# Patient Record
Sex: Female | Born: 1955 | Race: Black or African American | Hispanic: No | Smoking: Never smoker
Health system: Southern US, Community
[De-identification: ages and names within clinical notes are randomized; demographics above are authoritative.]

## PROBLEM LIST (undated history)

## (undated) DIAGNOSIS — E785 Hyperlipidemia, unspecified: Secondary | ICD-10-CM

## (undated) DIAGNOSIS — R42 Dizziness and giddiness: Secondary | ICD-10-CM

## (undated) DIAGNOSIS — D649 Anemia, unspecified: Secondary | ICD-10-CM

## (undated) DIAGNOSIS — E119 Type 2 diabetes mellitus without complications: Secondary | ICD-10-CM

## (undated) DIAGNOSIS — Z9889 Other specified postprocedural states: Secondary | ICD-10-CM

## (undated) DIAGNOSIS — I10 Essential (primary) hypertension: Secondary | ICD-10-CM

## (undated) DIAGNOSIS — R7303 Prediabetes: Secondary | ICD-10-CM

## (undated) DIAGNOSIS — R112 Nausea with vomiting, unspecified: Secondary | ICD-10-CM

## (undated) DIAGNOSIS — F419 Anxiety disorder, unspecified: Secondary | ICD-10-CM

## (undated) DIAGNOSIS — M1990 Unspecified osteoarthritis, unspecified site: Secondary | ICD-10-CM

## (undated) DIAGNOSIS — T8859XA Other complications of anesthesia, initial encounter: Secondary | ICD-10-CM

## (undated) DIAGNOSIS — I498 Other specified cardiac arrhythmias: Secondary | ICD-10-CM

## (undated) HISTORY — DX: Hyperlipidemia, unspecified: E78.5

## (undated) HISTORY — DX: Anemia, unspecified: D64.9

## (undated) HISTORY — PX: SHOULDER ARTHROSCOPY W/ ROTATOR CUFF REPAIR: SHX2400

## (undated) HISTORY — DX: Other specified cardiac arrhythmias: I49.8

## (undated) HISTORY — DX: Unspecified osteoarthritis, unspecified site: M19.90

## (undated) HISTORY — DX: Anxiety disorder, unspecified: F41.9

## (undated) HISTORY — DX: Type 2 diabetes mellitus without complications: E11.9

## (undated) HISTORY — PX: OTHER SURGICAL HISTORY: SHX169

## (undated) HISTORY — DX: Essential (primary) hypertension: I10

---

## 2004-01-20 ENCOUNTER — Emergency Department: Payer: Self-pay | Admitting: Emergency Medicine

## 2004-04-21 ENCOUNTER — Ambulatory Visit: Payer: Self-pay | Admitting: Physician Assistant

## 2004-05-11 ENCOUNTER — Other Ambulatory Visit: Payer: Self-pay

## 2004-05-13 ENCOUNTER — Ambulatory Visit: Payer: Self-pay | Admitting: Unknown Physician Specialty

## 2005-03-08 ENCOUNTER — Emergency Department: Payer: Self-pay | Admitting: Internal Medicine

## 2006-01-11 ENCOUNTER — Ambulatory Visit: Payer: Self-pay | Admitting: Family Medicine

## 2006-09-24 ENCOUNTER — Ambulatory Visit: Payer: Self-pay | Admitting: Family Medicine

## 2007-04-20 ENCOUNTER — Ambulatory Visit: Payer: Self-pay | Admitting: Family Medicine

## 2007-12-30 ENCOUNTER — Ambulatory Visit: Payer: Self-pay

## 2008-03-26 ENCOUNTER — Ambulatory Visit: Payer: Self-pay | Admitting: Unknown Physician Specialty

## 2008-04-03 ENCOUNTER — Ambulatory Visit: Payer: Self-pay | Admitting: Unknown Physician Specialty

## 2011-02-04 ENCOUNTER — Ambulatory Visit: Payer: Self-pay | Admitting: Family Medicine

## 2012-02-08 ENCOUNTER — Ambulatory Visit: Payer: Self-pay | Admitting: Family Medicine

## 2012-03-14 ENCOUNTER — Emergency Department: Payer: Self-pay | Admitting: Emergency Medicine

## 2012-04-04 ENCOUNTER — Ambulatory Visit: Payer: Self-pay | Admitting: Family Medicine

## 2013-03-06 ENCOUNTER — Encounter: Payer: Self-pay | Admitting: Podiatry

## 2013-03-06 ENCOUNTER — Ambulatory Visit (INDEPENDENT_AMBULATORY_CARE_PROVIDER_SITE_OTHER): Payer: BC Managed Care – PPO | Admitting: Podiatry

## 2013-03-06 VITALS — BP 197/108 | HR 89 | Resp 16 | Ht 63.0 in | Wt 215.0 lb

## 2013-03-06 DIAGNOSIS — M722 Plantar fascial fibromatosis: Secondary | ICD-10-CM

## 2013-03-06 MED ORDER — TRIAMCINOLONE ACETONIDE 10 MG/ML IJ SUSP
10.0000 mg | Freq: Once | INTRAMUSCULAR | Status: AC
  Administered 2013-03-06: 10 mg

## 2013-03-06 MED ORDER — DICLOFENAC SODIUM 75 MG PO TBEC: 75.0000 mg | DELAYED_RELEASE_TABLET | Freq: Two times a day (BID) | ORAL | Status: DC

## 2013-03-06 NOTE — Progress Notes (Signed)
   Subjective:    Patient ID: Robin Daniels, female    DOB: March 21, 1956, 57 y.o.   MRN: [id number]  HPI Comments: Last injection did not help     Review of Systems     Objective:   Physical Exam        Assessment & Plan:

## 2013-03-07 NOTE — Progress Notes (Signed)
Subjective:     Patient ID: Robin Daniels, female   DOB: 1955/12/12, 57 y.o.   MRN: [id number]  HPI patient states my right foot is very sore and makes it very hard for me to walk on   Review of Systems     Objective:   Physical Exam Neurovascular status intact with intense discomfort plantar aspect right heel at the insertional point of the tendon into the calcaneus    Assessment:     Plan her fasciitis right of acute nature with inflammation and pain    Plan:     H&P done and discussed condition. Complete immobilization due to the intensity of discomfort and today air fracture walker dispensed with instructions on usage due to the inflammation I reinjected the tendon 3 mg Kenalog 5 of Xylocaine Marcaine mixture to reduce the inflammatory complex and reappoint in 3 weeks

## 2013-03-27 ENCOUNTER — Ambulatory Visit: Payer: BC Managed Care – PPO | Admitting: Podiatry

## 2013-04-03 ENCOUNTER — Ambulatory Visit: Payer: BC Managed Care – PPO | Admitting: Podiatry

## 2014-04-15 ENCOUNTER — Ambulatory Visit: Payer: Self-pay | Admitting: Family Medicine

## 2014-05-31 ENCOUNTER — Ambulatory Visit: Payer: Self-pay | Admitting: Family Medicine

## 2014-06-13 SURGERY — Surgical Case
Anesthesia: *Unknown

## 2014-06-27 ENCOUNTER — Ambulatory Visit
Admit: 2014-06-27 | Disposition: A | Discharge status: Home / Self Care | Payer: Self-pay | Attending: Family Medicine | Admitting: Family Medicine

## 2014-07-23 ENCOUNTER — Ambulatory Visit
Admit: 2014-07-23 | Disposition: A | Discharge status: Home / Self Care | Payer: Self-pay | Attending: Gastroenterology | Admitting: Gastroenterology

## 2014-07-25 LAB — SURGICAL PATHOLOGY

## 2014-09-09 ENCOUNTER — Telehealth: Payer: Self-pay | Admitting: Family Medicine

## 2014-09-09 NOTE — Telephone Encounter (Signed)
Contacted patient to inform her that she will have to come in for an office visit in order for her paperwork to be complete by Dr. Wynetta Fines. Patient was informed that if her blood pressure and/or her EKG was not with normal limits then her surgery will have to be postponed. Patient agreed and asked if I could set her up an appointment. Patient was scheduled for this Wednesday (09/11/14) at 3:30pm.

## 2014-09-09 NOTE — Telephone Encounter (Signed)
Please contact patient and ask her to f/u in office for medical clearance examination. [facility] is Requesting Medical Clearance. Please encourage patient to take her medications regularly because if her blood pressure is high or if her EKG during the office visit is abnormal she will have to delay her surgery until we do further work up.

## 2014-09-11 ENCOUNTER — Ambulatory Visit (INDEPENDENT_AMBULATORY_CARE_PROVIDER_SITE_OTHER): Payer: BLUE CROSS/BLUE SHIELD | Admitting: Family Medicine

## 2014-09-11 ENCOUNTER — Encounter: Payer: Self-pay | Admitting: Family Medicine

## 2014-09-11 VITALS — BP 140/88 | HR 76 | Temp 98.0°F | Resp 18 | Ht 64.0 in | Wt 211.1 lb

## 2014-09-11 DIAGNOSIS — E559 Vitamin D deficiency, unspecified: Secondary | ICD-10-CM | POA: Insufficient documentation

## 2014-09-11 DIAGNOSIS — Q210 Ventricular septal defect: Secondary | ICD-10-CM | POA: Insufficient documentation

## 2014-09-11 DIAGNOSIS — Z1239 Encounter for other screening for malignant neoplasm of breast: Secondary | ICD-10-CM | POA: Insufficient documentation

## 2014-09-11 DIAGNOSIS — Z23 Encounter for immunization: Secondary | ICD-10-CM

## 2014-09-11 DIAGNOSIS — Z87898 Personal history of other specified conditions: Secondary | ICD-10-CM | POA: Insufficient documentation

## 2014-09-11 DIAGNOSIS — E119 Type 2 diabetes mellitus without complications: Secondary | ICD-10-CM | POA: Diagnosis not present

## 2014-09-11 DIAGNOSIS — R7301 Impaired fasting glucose: Secondary | ICD-10-CM | POA: Insufficient documentation

## 2014-09-11 DIAGNOSIS — Z0000 Encounter for general adult medical examination without abnormal findings: Secondary | ICD-10-CM | POA: Insufficient documentation

## 2014-09-11 DIAGNOSIS — R7309 Other abnormal glucose: Secondary | ICD-10-CM

## 2014-09-11 DIAGNOSIS — E785 Hyperlipidemia, unspecified: Secondary | ICD-10-CM | POA: Insufficient documentation

## 2014-09-11 DIAGNOSIS — D509 Iron deficiency anemia, unspecified: Secondary | ICD-10-CM | POA: Insufficient documentation

## 2014-09-11 DIAGNOSIS — R7303 Prediabetes: Secondary | ICD-10-CM | POA: Insufficient documentation

## 2014-09-11 DIAGNOSIS — R058 Other specified cough: Secondary | ICD-10-CM | POA: Insufficient documentation

## 2014-09-11 DIAGNOSIS — Z1211 Encounter for screening for malignant neoplasm of colon: Secondary | ICD-10-CM | POA: Insufficient documentation

## 2014-09-11 DIAGNOSIS — F419 Anxiety disorder, unspecified: Secondary | ICD-10-CM | POA: Insufficient documentation

## 2014-09-11 DIAGNOSIS — I10 Essential (primary) hypertension: Secondary | ICD-10-CM | POA: Insufficient documentation

## 2014-09-11 DIAGNOSIS — M25561 Pain in right knee: Secondary | ICD-10-CM

## 2014-09-11 DIAGNOSIS — G8929 Other chronic pain: Secondary | ICD-10-CM | POA: Diagnosis not present

## 2014-09-11 DIAGNOSIS — Z789 Other specified health status: Secondary | ICD-10-CM | POA: Insufficient documentation

## 2014-09-11 DIAGNOSIS — E668 Other obesity: Secondary | ICD-10-CM | POA: Insufficient documentation

## 2014-09-11 DIAGNOSIS — E669 Obesity, unspecified: Secondary | ICD-10-CM | POA: Insufficient documentation

## 2014-09-11 DIAGNOSIS — R05 Cough: Secondary | ICD-10-CM | POA: Insufficient documentation

## 2014-09-11 DIAGNOSIS — G43009 Migraine without aura, not intractable, without status migrainosus: Secondary | ICD-10-CM | POA: Insufficient documentation

## 2014-09-11 DIAGNOSIS — M722 Plantar fascial fibromatosis: Secondary | ICD-10-CM | POA: Insufficient documentation

## 2014-09-11 HISTORY — DX: Anxiety disorder, unspecified: F41.9

## 2014-09-11 LAB — POCT GLYCOSYLATED HEMOGLOBIN (HGB A1C)
Hemoglobin A1C: 5.9
Hemoglobin A1C: 5.9

## 2014-09-11 NOTE — Progress Notes (Signed)
Name: Robin Daniels   MRN: [id number]    DOB: 27-Oct-1955   Date:09/11/2014       Progress Note  Subjective  Chief Complaint  Chief Complaint  Patient presents with  . Medical Clearance    patient will soon be having a right total knee replacement at [facility], performed by Dr. Danella Sensing    HPI  Joint/Muscle Pain: Patient complains of arthralgias for which has been present for several years. Pain is located in the right knee(s), is described as aching, and is severe .  Associated symptoms include: decreased range of motion, effusion and instability.  The patient has tried cold, heat, NSAIDs and rest for pain, with minimal relief.  Related to injury:   no.  Patient Active Problem List   Diagnosis Date Noted  . Encounter for general adult medical examination without abnormal findings 09/11/2014  . Anxiety 09/11/2014  . Arthritis 09/11/2014  . Gravida 2 para 2 09/11/2014  . BP (high blood pressure) 09/11/2014  . HLD (hyperlipidemia) 09/11/2014  . Migraine without aura and responsive to treatment 09/11/2014  . Nocturnal cough 09/11/2014  . Extreme obesity 09/11/2014  . Anemia, iron deficiency 09/11/2014  . Parity 2 09/11/2014  . Plantar fasciitis 09/11/2014  . Borderline diabetes 09/11/2014  . Screening breast examination 09/11/2014  . Encounter for screening for malignant neoplasm of colon 09/11/2014  . Avitaminosis D 09/11/2014  . Absence of interventricular septum 09/11/2014    History  Substance Use Topics  . Smoking status: Never Smoker   . Smokeless tobacco: Never Used  . Alcohol Use: No     Current outpatient prescriptions:  .  acetaminophen-codeine (TYLENOL #4) 300-60 MG per tablet, Take by mouth., Disp: , Rfl:  .  diclofenac (VOLTAREN) 75 MG EC tablet, Take 1 tablet (75 mg total) by mouth 2 (two) times daily., Disp: 60 tablet, Rfl: 0 .  ibuprofen (ADVIL,MOTRIN) 800 MG tablet, Take by mouth., Disp: , Rfl:  .  lisinopril-hydrochlorothiazide (PRINZIDE,ZESTORETIC) 20-12.5  MG per tablet, Take by mouth., Disp: , Rfl:  .  metoprolol succinate (TOPROL-XL) 100 MG 24 hr tablet, Take by mouth., Disp: , Rfl:  .  simvastatin (ZOCOR) 10 MG tablet, Take by mouth., Disp: , Rfl:   Past Surgical History  Procedure Laterality Date  . Shoulder arthroscopy w/ rotator cuff repair Right     Family History  Problem Relation Age of Onset  . Diabetes Mother   . Hyperlipidemia Mother   . Hypertension Mother   . Vision loss Mother   . Arthritis Father   . Hyperlipidemia Father   . Hypertension Father     No Known Allergies   Review of Systems  Ten systems reviewed and is negative except as mentioned in HPI.    Objective  BP 140/88 mmHg  Pulse 76  Temp(Src) 98 F (36.7 C) (Oral)  Resp 18  Ht 5\' 4"  (1.626 m)  Wt 211 lb 1.6 oz (95.754 kg)  BMI 36.22 kg/m2  SpO2 98%  Physical Exam  Constitutional: Patient appears obese and well-nourished. In no distress.  HEENT:  - Head: Normocephalic and atraumatic.  - Ears: Bilateral TMs gray, no erythema or effusion - Nose: Nasal mucosa moist - Mouth/Throat: Oropharynx is clear and moist. No tonsillar hypertrophy or erythema. No post nasal drainage.  - Eyes: Conjunctivae clear, EOM movements normal. PERRLA. No scleral icterus.  Neck: Normal range of motion. Neck supple. No JVD present. No thyromegaly present.  Cardiovascular: Normal rate, regular rhythm and normal heart sounds.  No murmur heard.  Pulmonary/Chest: Effort normal and breath sounds normal. No respiratory distress. Musculoskeletal: Normal range of motion bilateral UE. Right LE with modest swelling, crepitus on exam. Peripheral vascular: Bilateral LE no edema. Neurological: CN II-XII grossly intact with no focal deficits. Alert and oriented to person, place, and time. Coordination, balance, strength, speech and gait are normal.  Skin: Skin is warm and dry. No rash noted. No erythema.  Psychiatric: Patient has a normal mood and affect. Behavior is normal in  office today. Judgment and thought content normal in office today.    Assessment & Plan  1. Prediabetes Hba1c <6%. EKG reviewed no concerning acute findings. Vitals stable. Patient cleared to proceed with proposed surgery.  - POCT glycosylated hemoglobin (Hb A1C) - PR ELECTROCARDIOGRAM, COMPLETE; Standing - PR ELECTROCARDIOGRAM, COMPLETE - POCT glycosylated hemoglobin (Hb A1C); Standing - POCT glycosylated hemoglobin (Hb A1C)  2. Need for diphtheria-tetanus-pertussis (Tdap) vaccine, adult/adolescent  - Tdap vaccine greater than or equal to 7yo IM  3. Chronic pain of right knee Patient cleared to proceed with proposed surgery.

## 2014-09-15 ENCOUNTER — Emergency Department
Admission: EM | Admit: 2014-09-15 | Discharge: 2014-09-15 | Disposition: A | Discharge status: Home / Self Care | Payer: Self-pay

## 2014-09-18 ENCOUNTER — Encounter: Payer: Self-pay | Admitting: Family Medicine

## 2014-10-17 DIAGNOSIS — I10 Essential (primary) hypertension: Secondary | ICD-10-CM | POA: Insufficient documentation

## 2014-10-17 DIAGNOSIS — E7800 Pure hypercholesterolemia, unspecified: Secondary | ICD-10-CM | POA: Insufficient documentation

## 2014-10-17 DIAGNOSIS — Z96659 Presence of unspecified artificial knee joint: Secondary | ICD-10-CM | POA: Insufficient documentation

## 2015-02-18 ENCOUNTER — Encounter: Payer: Self-pay | Admitting: Family Medicine

## 2015-02-18 ENCOUNTER — Ambulatory Visit (INDEPENDENT_AMBULATORY_CARE_PROVIDER_SITE_OTHER): Payer: BLUE CROSS/BLUE SHIELD | Admitting: Family Medicine

## 2015-02-18 VITALS — BP 174/100 | HR 84 | Temp 98.2°F | Resp 16 | Wt 201.5 lb

## 2015-02-18 DIAGNOSIS — I10 Essential (primary) hypertension: Secondary | ICD-10-CM

## 2015-02-18 DIAGNOSIS — I160 Hypertensive urgency: Secondary | ICD-10-CM

## 2015-02-18 DIAGNOSIS — R002 Palpitations: Secondary | ICD-10-CM | POA: Insufficient documentation

## 2015-02-18 MED ORDER — METOPROLOL SUCCINATE ER 50 MG PO TB24: 50.0000 mg | ORAL_TABLET | Freq: Every day | ORAL | Status: DC

## 2015-02-18 NOTE — Progress Notes (Signed)
Name: KHADRA RUTT   MRN: [id number]    DOB: [birth year]   Date:02/18/2015       Progress Note  Subjective  Chief Complaint  Chief Complaint  Patient presents with  . Hypertension  . Irregular Heart Beat    patient stated that she has been having lots of heart flutters which results in a cough.    HPI  Guadelupe Brevig is a 59 year old female who last saw me 09/11/14 and since then has had right total knee replacement at [facility]. However after her surgery she stopped all of her chronic medications including Lisinopril-HCTZ 20-12.5mg  one a day, Toprol XL 100 mg one a day, Simvastatin 10 mg one a day. She now reports having heart flutters and a dry cough. Denies chest pain, syncope, headaches, paresthesia.  Past Medical History  Diagnosis Date  . Anemia     history of  . Arthritis   . Diabetes mellitus without complication ([place])   . Fluttering heart ([place])   . Hyperlipidemia   . Hypertension   . Anxiety 09/11/2014    Patient Active Problem List   Diagnosis Date Noted  . Essential (primary) hypertension 10/17/2014  . Pure hypercholesterolemia 10/17/2014  . H/O total knee replacement 10/17/2014  . Encounter for general adult medical examination without abnormal findings 09/11/2014  . Anxiety 09/11/2014  . Chronic pain of right knee 09/11/2014  . Gravida 2 para 2 09/11/2014  . BP (high blood pressure) 09/11/2014  . HLD (hyperlipidemia) 09/11/2014  . Migraine without aura and responsive to treatment 09/11/2014  . Nocturnal cough 09/11/2014  . Extreme obesity ([place]) 09/11/2014  . Anemia, iron deficiency 09/11/2014  . Parity 2 09/11/2014  . Plantar fasciitis 09/11/2014  . Borderline diabetes 09/11/2014  . Screening breast examination 09/11/2014  . Encounter for screening for malignant neoplasm of colon 09/11/2014  . Avitaminosis D 09/11/2014  . Absence of interventricular septum 09/11/2014  . 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 Morbid obesity ([place]) 09/11/2014    Social History   Substance Use Topics  . Smoking status: Never Smoker   . Smokeless tobacco: Never Used  . Alcohol Use: No     Current outpatient prescriptions:  .  acetaminophen-codeine (TYLENOL #4) 300-60 MG per tablet, Take by mouth., Disp: , Rfl:  .  diclofenac (VOLTAREN) 75 MG EC tablet, Take 1 tablet (75 mg total) by mouth 2 (two) times daily. (Patient not taking: Reported on 02/18/2015), Disp: 60 tablet, Rfl: 0 .  ibuprofen (ADVIL,MOTRIN) 800 MG tablet, Take by mouth., Disp: , Rfl:  .  lisinopril-hydrochlorothiazide (PRINZIDE,ZESTORETIC) 20-12.5 MG per tablet, Take by mouth., Disp: , Rfl:  .  metoprolol succinate (TOPROL-XL) 100 MG 24 hr tablet, Take by mouth., Disp: , Rfl:  .  simvastatin (ZOCOR) 10 MG tablet, Take by mouth., Disp: , Rfl:   Past Surgical History  Procedure Laterality Date  . Shoulder arthroscopy w/ rotator cuff repair Right     Family History  Problem Relation Age of Onset  . Diabetes Mother   . Hyperlipidemia Mother   . Hypertension Mother   . Vision loss Mother   . Arthritis Father   . Hyperlipidemia Father   . Hypertension Father     No Known Allergies   Review of Systems  CONSTITUTIONAL: No significant weight changes, fever, chills, weakness or fatigue.  CARDIOVASCULAR: No chest pain, chest pressure or chest discomfort. Yes palpitations. RESPIRATORY: No shortness of breath. Yes dry cough. NEUROLOGICAL: No headache, dizziness, syncope, paralysis, ataxia, numbness or tingling  in the extremities. No memory changes. No change in bowel or bladder control.  MUSCULOSKELETAL: No joint pain. No muscle pain. PSYCHIATRIC: No change in mood. No change in sleep pattern.  ENDOCRINOLOGIC: No reports of sweating, cold or heat intolerance. No polyuria or polydipsia.     Objective  BP 174/100 mmHg  Pulse 84  Temp(Src) 98.2 F (36.8 C) (Oral)  Resp 16  Wt 201 lb 8 oz (91.4 kg)  SpO2 96% Body mass index is 34.57 kg/(m^2).  Physical Exam  Constitutional: Patient  appears to have lost some weight and is well-nourished. In no distress.   Neck: Normal range of motion. Neck supple. No JVD present. No thyromegaly present.  Cardiovascular: Normal rate, regular rhythm and normal heart sounds.  No murmur heard.  Pulmonary/Chest: Effort normal and breath sounds normal. No respiratory distress. Musculoskeletal: Normal range of motion bilateral UE and LE, no joint effusions. Peripheral vascular: Bilateral LE no edema. Neurological: CN II-XII grossly intact with no focal deficits. Alert and oriented to person, place, and time. Coordination, balance, strength, speech and gait are normal.  Skin: Skin is warm and dry. No rash noted. No erythema.  Psychiatric: Patient has a normal mood and affect. Behavior is normal in office today. Judgment and thought content normal in office today.  Assessment & Plan  1. Hypertension goal BP (blood pressure) < 140/90 Non compliant with medication regimen since her knee surgery. Counseled patient on the importance of BP control. Will start her back on Toprol XL 50 mg one a day (previously she was on 100 mg dose).  - EKG 12-Lead - metoprolol succinate (TOPROL-XL) 50 MG 24 hr tablet; Take 1 tablet (50 mg total) by mouth daily.  Dispense: 30 tablet; Refill: 1  2. Hypertensive urgency EKG shows NSR with no ST segment changes or T wave inversions, does however suggest LVH. Recommended that she consult with Cardiologist near future for Echo and Stress testing. She preferred to get her BP down first.  - EKG 12-Lead  3. Heart palpitations Due to uncontrolled HTN.

## 2015-03-11 ENCOUNTER — Ambulatory Visit: Payer: BLUE CROSS/BLUE SHIELD | Admitting: Family Medicine

## 2015-03-19 ENCOUNTER — Ambulatory Visit: Payer: BLUE CROSS/BLUE SHIELD | Admitting: Family Medicine

## 2015-10-06 ENCOUNTER — Ambulatory Visit (INDEPENDENT_AMBULATORY_CARE_PROVIDER_SITE_OTHER): Payer: BLUE CROSS/BLUE SHIELD | Admitting: Family Medicine

## 2015-10-06 ENCOUNTER — Encounter: Payer: Self-pay | Admitting: Family Medicine

## 2015-10-06 VITALS — BP 162/90 | HR 81 | Temp 98.4°F | Resp 14 | Wt 209.5 lb

## 2015-10-06 DIAGNOSIS — I10 Essential (primary) hypertension: Secondary | ICD-10-CM

## 2015-10-06 DIAGNOSIS — M545 Low back pain, unspecified: Secondary | ICD-10-CM

## 2015-10-06 DIAGNOSIS — R7301 Impaired fasting glucose: Secondary | ICD-10-CM

## 2015-10-06 DIAGNOSIS — E669 Obesity, unspecified: Secondary | ICD-10-CM

## 2015-10-06 MED ORDER — TRAMADOL HCL 50 MG PO TABS: 50.0000 mg | ORAL_TABLET | Freq: Four times a day (QID) | ORAL | Status: DC | PRN

## 2015-10-06 MED ORDER — METOPROLOL SUCCINATE ER 50 MG PO TB24: ORAL_TABLET | ORAL | Status: DC

## 2015-10-06 NOTE — Progress Notes (Signed)
BP (!) 162/90   Pulse 81   Temp 98.4 F (36.9 C) (Oral)   Resp 14   Wt 209 lb 8 oz (95 kg)   SpO2 95%   BMI 35.96 kg/m    Subjective:    Patient ID: Robin Daniels, female    DOB: [birth year], 60 y.o.   MRN: [id number]  HPI: Robin Daniels is a 60 y.o. female  Chief Complaint  Patient presents with  . Back Pain    center low back 2 months, unknown trauama   Pain in the lower back x 2 months; aching pain Can find a position when she doesn't feel it at all Has had some pain down in the buttock or leg Needs a knee replacement on the left; already had the right knee done She has arthritis in the knees, not sure about the back Hurts worse when she stands or sits for a long time Rolling over in the bed might make her feel something Sometimes on the left flank Feeling it with coughing No pneumonia No fevers No blood in the urine or stools Father had back problems; had to remove a disc; no known kidney trouble in the family She had the colonoscopy done, two years ago; no problems with female organs No trauma Taking 800 mg motrins, which lasts for a little while; she has alternated with aleve, but not in the same day She has had high BP for years; she has not been taking any metoprolol for a long time; she retired months ago from her job Prediabetes; last A1c was 5.9  Depression screen [facility] 2/9 10/06/2015 02/18/2015 09/11/2014  Decreased Interest 0 0 0  Down, Depressed, Hopeless 0 0 0  PHQ - 2 Score 0 0 0   Relevant past medical, surgical, family and social history reviewed Past Medical History:  Diagnosis Date  . Anemia    history of  . Anxiety 09/11/2014  . Arthritis   . Diabetes mellitus without complication ([place])   . 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 Hyperlipidemia   . Hypertension    Past Surgical History:  Procedure Laterality Date  . right knee replacement Right [id number]  . SHOULDER ARTHROSCOPY W/ ROTATOR CUFF REPAIR Right    Family History  Problem Relation Age of  Onset  . Diabetes Mother   . Hyperlipidemia Mother   . Hypertension Mother   . Vision loss Mother   . Arthritis Father   . Hyperlipidemia Father   . Hypertension Father   . Breast cancer Maternal Aunt    Social History  Substance Use Topics  . Smoking status: Never Smoker  . Smokeless tobacco: Never Used  . Alcohol use No   Interim medical history since last visit reviewed. Allergies and medications reviewed  Review of Systems Per HPI unless specifically indicated above     Objective:    BP (!) 162/90   Pulse 81   Temp 98.4 F (36.9 C) (Oral)   Resp 14   Wt 209 lb 8 oz (95 kg)   SpO2 95%   BMI 35.96 kg/m   Wt Readings from Last 3 Encounters:  11/26/15 210 lb 3.3 oz (95.4 kg)  10/06/15 209 lb 8 oz (95 kg)  02/18/15 201 lb 8 oz (91.4 kg)    Physical Exam  Constitutional: She appears well-developed and well-nourished. No distress.  HENT:  Head: Normocephalic and atraumatic.  Eyes: EOM are normal. No scleral icterus.  Neck: No thyromegaly present.  Cardiovascular: Normal rate,  regular rhythm and normal heart sounds.   No murmur heard. Pulmonary/Chest: Effort normal and breath sounds normal. No respiratory distress. She has no wheezes.  Abdominal: Soft. Bowel sounds are normal. She exhibits no distension.  Musculoskeletal: She exhibits no edema.       Lumbar back: She exhibits decreased range of motion and tenderness. She exhibits no bony tenderness, no swelling, no edema and no deformity.  Neurological: She is alert. She displays normal reflexes. She exhibits normal muscle tone. Coordination normal.  LE strength 5/5  Skin: Skin is warm and dry. She is not diaphoretic. No pallor.  Psychiatric: She has a normal mood and affect. Her behavior is normal. Judgment and thought content normal.       Assessment & Plan:   Problem List Items Addressed This Visit      Cardiovascular and Mediastinum   Hypertension goal BP (blood pressure) < 140/90    Try the DASH  guidelines; uncontrolled, so will start back on BP medicine; try to lose 10 pounds over the next 3 months; f/u here or at [facility] soon      Relevant Medications   metoprolol succinate (TOPROL-XL) 50 MG 24 hr tablet     Endocrine   IFG (impaired fasting glucose)    Would love to get A1c but respect that she does not have insurance and she cannot afford it; she will try to get that done at [facility]; avoid white bread, white rice, white potatoes; work on weight loss        Other   Obesity    Encouraged modest weight loss, little by little but cutting back on calories, consistent effort, dirnking adequate water, important to help with her pressure and glucose and lipids       Other Visit Diagnoses    Bilateral low back pain without sciatica    -  Primary   will check urine to r/o hematuria; exercises recommended; pt has no insurance, so will skip xrays at this time; urged to call if not improving with treatment   Relevant Medications   traMADol (ULTRAM) 50 MG tablet   Other Relevant Orders   Urinalysis w microscopic + reflex cultur (Completed)      Follow up plan: No Follow-up on file.  An after-visit summary was printed and given to the patient at [place].  Please see the patient instructions which may contain other information and recommendations beyond what is mentioned above in the assessment and plan.  Meds ordered this encounter  Medications  . traMADol (ULTRAM) 50 MG tablet    Sig: Take 1 tablet (50 mg total) by mouth every 6 (six) hours as needed.    Dispense:  25 tablet    Refill:  0  . metoprolol succinate (TOPROL-XL) 50 MG 24 hr tablet    Sig: Start with just half of a pill by mouth for six days, then take a whole pill every day (for blood pressure)    Dispense:  30 tablet    Refill:  0    Orders Placed This Encounter  Procedures  . Urinalysis w microscopic + reflex cultur

## 2015-10-06 NOTE — Assessment & Plan Note (Addendum)
Would love to get A1c but respect that she does not have insurance and she cannot afford it; she will try to get that done at [facility]; avoid white bread, white rice, white potatoes; work on weight loss

## 2015-10-06 NOTE — Assessment & Plan Note (Addendum)
Try the DASH guidelines; uncontrolled, so will start back on BP medicine; try to lose 10 pounds over the next 3 months; f/u here or at [facility] soon

## 2015-10-06 NOTE — Patient Instructions (Addendum)
Please do check out [facility] to get healthcare if affordability is a problem Please do the urine test today  Your goal blood pressure is less than 150 mmHg on top, unless you develop diabetes and then the top goal will be less than 140 on top Try to follow the DASH guidelines (DASH stands for Dietary Approaches to Stop Hypertension) Try to limit the sodium in your diet.  Ideally, consume less than 1.5 grams (less than 1,500mg ) per day. Do not add salt when cooking or at the table.  Check the sodium amount on labels when shopping, and choose items lower in sodium when given a choice. Avoid or limit foods that already contain a lot of sodium. Eat a diet rich in fruits and vegetables and whole grains.  Check your blood pressure a few times over the coming two weeks and call me back with your readings  Check out the information at familydoctor.org entitled "Nutrition for Weight Loss: What You Need to Know about Fad Diets" Try to lose between 1-2 pounds per week by taking in fewer calories and burning off more calories You can succeed by limiting portions, limiting foods dense in calories and fat, becoming more active, and drinking 8 glasses of water a day (64 ounces) Don't skip meals, especially breakfast, as skipping meals may alter your metabolism Do not use over-the-counter weight loss pills or gimmicks that claim rapid weight loss A healthy BMI (or body mass index) is between 18.5 and 24.9 You can calculate your ideal BMI at the [place] website ClubMonetize.fr  Back Exercises The following exercises strengthen the muscles that help to support the back. They also help to keep the lower back flexible. Doing these exercises can help to prevent back pain or lessen existing pain. If you have back pain or discomfort, try doing these exercises 2-3 times each day or as told by your health care provider. When the pain goes away, do them once each  day, but increase the number of times that you repeat the steps for each exercise (do more repetitions). If you do not have back pain or discomfort, do these exercises once each day or as told by your health care provider. EXERCISES Single Knee to Chest Repeat these steps 3-5 times for each leg:  Lie on your back on a firm bed or the floor with your legs extended.  Bring one knee to your chest. Your other leg should stay extended and in contact with the floor.  Hold your knee in place by grabbing your knee or thigh.  Pull on your knee until you feel a gentle stretch in your lower back.  Hold the stretch for 10-30 seconds.  Slowly release and straighten your leg. Pelvic Tilt Repeat these steps 5-10 times: 1. Lie on your back on a firm bed or the floor with your legs extended. 2. Bend your knees so they are pointing toward the ceiling and your feet are flat on the floor. 3. Tighten your lower abdominal muscles to press your lower back against the floor. This motion will tilt your pelvis so your tailbone points up toward the ceiling instead of pointing to your feet or the floor. 4. With gentle tension and even breathing, hold this position for 5-10 seconds. Cat-Cow Repeat these steps until your lower back becomes more flexible: 1. Get into a hands-and-knees position on a firm surface. Keep your hands under your shoulders, and keep your knees under your hips. You may place padding under your knees  for comfort. 2. Let your head hang down, and point your tailbone toward the floor so your lower back becomes rounded like the back of a cat. 3. Hold this position for 5 seconds. 4. Slowly lift your head and point your tailbone up toward the ceiling so your back forms a sagging arch like the back of a cow. 5. Hold this position for 5 seconds. Press-Ups Repeat these steps 5-10 times: 1. Lie on your abdomen (face-down) on the floor. 2. Place your palms near your head, about shoulder-width  apart. 3. While you keep your back as relaxed as possible and keep your hips on the floor, slowly straighten your arms to raise the top half of your body and lift your shoulders. Do not use your back muscles to raise your upper torso. You may adjust the placement of your hands to make yourself more comfortable. 4. Hold this position for 5 seconds while you keep your back relaxed. 5. Slowly return to lying flat on the floor. Bridges Repeat these steps 10 times: 1. Lie on your back on a firm surface. 2. Bend your knees so they are pointing toward the ceiling and your feet are flat on the floor. 3. Tighten your buttocks muscles and lift your buttocks off of the floor until your waist is at almost the same height as your knees. You should feel the muscles working in your buttocks and the back of your thighs. If you do not feel these muscles, slide your feet 1-2 inches farther away from your buttocks. 4. Hold this position for 3-5 seconds. 5. Slowly lower your hips to the starting position, and allow your buttocks muscles to relax completely. If this exercise is too easy, try doing it with your arms crossed over your chest. Abdominal Crunches Repeat these steps 5-10 times: 1. Lie on your back on a firm bed or the floor with your legs extended. 2. Bend your knees so they are pointing toward the ceiling and your feet are flat on the floor. 3. Cross your arms over your chest. 4. Tip your chin slightly toward your chest without bending your neck. 5. Tighten your abdominal muscles and slowly raise your trunk (torso) high enough to lift your shoulder blades a tiny bit off of the floor. Avoid raising your torso higher than that, because it can put too much stress on your low back and it does not help to strengthen your abdominal muscles. 6. Slowly return to your starting position. Back Lifts Repeat these steps 5-10 times: 1. Lie on your abdomen (face-down) with your arms at your sides, and rest your  forehead on the floor. 2. Tighten the muscles in your legs and your buttocks. 3. Slowly lift your chest off of the floor while you keep your hips pressed to the floor. Keep the back of your head in line with the curve in your back. Your eyes should be looking at the floor. 4. Hold this position for 3-5 seconds. 5. Slowly return to your starting position. SEEK MEDICAL CARE IF:  Your back pain or discomfort gets much worse when you do an exercise.  Your back pain or discomfort does not lessen within 2 hours after you exercise. If you have any of these problems, stop doing these exercises right away. Do not do them again unless your health care provider says that you can. SEEK IMMEDIATE MEDICAL CARE IF:  You develop sudden, severe back pain. If this happens, stop doing the exercises right away. Do not do them  again unless your health care provider says that you can.   This information is not intended to replace advice given to you by your health care provider. Make sure you discuss any questions you have with your health care provider.   Document Released: 04/22/2004 Document Revised: 12/04/2014 Document Reviewed: 05/09/2014 Elsevier Interactive Patient Education 2016 [place] DASH stands for "Dietary Approaches to Stop Hypertension." The DASH eating plan is a healthy eating plan that has been shown to reduce high blood pressure (hypertension). Additional health benefits may include reducing the risk of type 2 diabetes mellitus, heart disease, and stroke. The DASH eating plan may also help with weight loss. WHAT DO I NEED TO KNOW ABOUT THE DASH EATING PLAN? For the DASH eating plan, you will follow these general guidelines:  Choose foods with a percent daily value for sodium of less than 5% (as listed on the food label).  Use salt-free seasonings or herbs instead of table salt or sea salt.  Check with your health care provider or pharmacist before using salt  substitutes.  Eat lower-sodium products, often labeled as "lower sodium" or "no salt added."  Eat fresh foods.  Eat more vegetables, fruits, and low-fat dairy products.  Choose whole grains. Look for the word "whole" as the first word in the ingredient list.  Choose fish and skinless chicken or [place] more often than red meat. Limit fish, poultry, and meat to 6 oz (170 g) each day.  Limit sweets, desserts, sugars, and sugary drinks.  Choose heart-healthy fats.  Limit cheese to 1 oz (28 g) per day.  Eat more home-cooked food and less restaurant, buffet, and fast food.  Limit fried foods.  Cook foods using methods other than frying.  Limit canned vegetables. If you do use them, rinse them well to decrease the sodium.  When eating at a restaurant, ask that your food be prepared with less salt, or no salt if possible. WHAT FOODS CAN I EAT? Seek help from a dietitian for individual calorie needs. Grains Whole grain or whole wheat bread. Brown rice. Whole grain or whole wheat pasta. Quinoa, bulgur, and whole grain cereals. Low-sodium cereals. Corn or whole wheat flour tortillas. Whole grain cornbread. Whole grain crackers. Low-sodium crackers. Vegetables Fresh or frozen vegetables (raw, steamed, roasted, or grilled). Low-sodium or reduced-sodium tomato and vegetable juices. Low-sodium or reduced-sodium tomato sauce and paste. Low-sodium or reduced-sodium canned vegetables.  Fruits All fresh, canned (in natural juice), or frozen fruits. Meat and Other Protein Products Ground beef (85% or leaner), grass-fed beef, or beef trimmed of fat. Skinless chicken or [place]. Ground chicken or [place]. Pork trimmed of fat. All fish and seafood. Eggs. Dried beans, peas, or lentils. Unsalted nuts and seeds. Unsalted canned beans. Dairy Low-fat dairy products, such as skim or 1% milk, 2% or reduced-fat cheeses, low-fat ricotta or cottage cheese, or plain low-fat yogurt. Low-sodium or reduced-sodium  cheeses. Fats and Oils Tub margarines without trans fats. Light or reduced-fat mayonnaise and salad dressings (reduced sodium). Avocado. Safflower, olive, or canola oils. Natural peanut or almond butter. Other Unsalted popcorn and pretzels. The items listed above may not be a complete list of recommended foods or beverages. Contact your dietitian for more options. WHAT FOODS ARE NOT RECOMMENDED? Grains White bread. White pasta. White rice. Refined cornbread. Bagels and croissants. Crackers that contain trans fat. Vegetables Creamed or fried vegetables. Vegetables in a cheese sauce. Regular canned vegetables. Regular canned tomato sauce and paste. Regular tomato and vegetable juices.  Fruits Dried fruits. Canned fruit in light or heavy syrup. Fruit juice. Meat and Other Protein Products Fatty cuts of meat. Ribs, chicken wings, bacon, sausage, bologna, salami, chitterlings, fatback, hot dogs, bratwurst, and packaged luncheon meats. Salted nuts and seeds. Canned beans with salt. Dairy Whole or 2% milk, cream, half-and-half, and cream cheese. Whole-fat or sweetened yogurt. Full-fat cheeses or blue cheese. Nondairy creamers and whipped toppings. Processed cheese, cheese spreads, or cheese curds. Condiments Onion and garlic salt, seasoned salt, table salt, and sea salt. Canned and packaged gravies. Worcestershire sauce. Tartar sauce. Barbecue sauce. Teriyaki sauce. Soy sauce, including reduced sodium. Steak sauce. Fish sauce. Oyster sauce. Cocktail sauce. Horseradish. Ketchup and mustard. Meat flavorings and tenderizers. Bouillon cubes. Hot sauce. Tabasco sauce. Marinades. Taco seasonings. Relishes. Fats and Oils Butter, stick margarine, lard, shortening, ghee, and bacon fat. Coconut, palm kernel, or palm oils. Regular salad dressings. Other Pickles and olives. Salted popcorn and pretzels. The items listed above may not be a complete list of foods and beverages to avoid. Contact your dietitian for  more information. WHERE CAN I FIND MORE INFORMATION? National Heart, Lung, and Blood Institute: travelstabloid.com   This information is not intended to replace advice given to you by your health care provider. Make sure you discuss any questions you have with your health care provider.   Document Released: 03/04/2011 Document Revised: 04/05/2014 Document Reviewed: 01/17/2013 Elsevier Interactive Patient Education [employer].

## 2015-10-07 LAB — URINALYSIS W MICROSCOPIC + REFLEX CULTURE
Bilirubin Urine: NEGATIVE
Casts: NONE SEEN [LPF]
Crystals: NONE SEEN [HPF]
Glucose, UA: NEGATIVE
Hgb urine dipstick: NEGATIVE
Ketones, ur: NEGATIVE
Leukocytes, UA: NEGATIVE
Nitrite: NEGATIVE
Protein, ur: NEGATIVE
Specific Gravity, Urine: 1.016 (ref 1.001–1.035)
Yeast: NONE SEEN [HPF]
pH: 7 (ref 5.0–8.0)

## 2015-11-04 ENCOUNTER — Other Ambulatory Visit: Payer: Self-pay | Admitting: Family Medicine

## 2015-11-26 ENCOUNTER — Ambulatory Visit
Admission: RE | Admit: 2015-11-26 | Discharge: 2015-11-26 | Disposition: A | Discharge status: Home / Self Care | Payer: Self-pay | Source: Ambulatory Visit | Attending: Oncology | Admitting: Oncology

## 2015-11-26 ENCOUNTER — Ambulatory Visit: Payer: Self-pay | Attending: Oncology | Admitting: *Deleted

## 2015-11-26 ENCOUNTER — Encounter: Payer: Self-pay | Admitting: *Deleted

## 2015-11-26 ENCOUNTER — Encounter (INDEPENDENT_AMBULATORY_CARE_PROVIDER_SITE_OTHER): Payer: Self-pay

## 2015-11-26 VITALS — BP 146/89 | HR 75 | Temp 98.1°F | Ht 65.75 in | Wt 210.2 lb

## 2015-11-26 DIAGNOSIS — Z0000 Encounter for general adult medical examination without abnormal findings: Secondary | ICD-10-CM

## 2015-11-26 NOTE — Progress Notes (Signed)
Subjective:     Patient ID: Robin Daniels, female   DOB: 1956/02/22, 60 y.o.   MRN: [id number]  HPI   Review of Systems     Objective:   Physical Exam  Pulmonary/Chest: Right breast exhibits no inverted nipple, no mass, no nipple discharge, no skin change and no tenderness. Left breast exhibits no inverted nipple, no mass, no nipple discharge, no skin change and no tenderness. Breasts are symmetrical.  Large pendulous breast  Abdominal: There is no splenomegaly or hepatomegaly.  Genitourinary: No labial fusion. There is no rash, tenderness, lesion or injury on the right labia. There is no rash, tenderness, lesion or injury on the left labia. Cervix exhibits no motion tenderness, no discharge and no friability. Right adnexum displays no mass, no tenderness and no fullness. Left adnexum displays no mass, no tenderness and no fullness. No erythema, tenderness or bleeding in the vagina. No foreign body in the vagina. No signs of injury around the vagina. No vaginal discharge found.       Assessment:     60 year old Black female presents to [facility] for clinical breast exam, pap and mammogram.  Clinical breast exam unremarkable.  Taught self breast awareness.  Specimen collected for pap smear.  Patient has been screened for eligibility.  She does not have any insurance, Medicare or Medicaid.  She also meets financial eligibility.  Hand-out given on the Affordable Care Act.    Plan:     Screening mammogram ordered.  Specimen for pap sent to the lab.

## 2015-11-26 NOTE — Patient Instructions (Signed)
Human Papillomavirus Human papillomavirus (HPV) is the most common sexually transmitted infection (STI) and is highly contagious. HPV infections cause genital warts and cancers to the outlet of the womb (cervix), birth canal (vagina), opening of the birth canal (vulva), and anus. There are over 100 types of HPV. Unless wartlike lesions are present in the throat or there are genital warts that you can see or feel, HPV usually does not cause symptoms. It is possible to be infected for long periods and pass it on to others without knowing it. CAUSES  HPV is spread from person to person through sexual contact. This includes oral, vaginal, or anal sex. RISK FACTORS  Having unprotected sex. HPV can be spread by oral, vaginal, or anal sex.  Having several sex partners.  Having a sex partner who has other sex partners.  Having or having had another sexually transmitted infection. SIGNS AND SYMPTOMS  Most people carrying HPV do not have any symptoms. If symptoms are present, symptoms may include:  Wartlike lesions in the throat (from having oral sex).  Warts in the infected skin or mucous membranes.  Genital warts that may itch, burn, or bleed.  Genital warts that may be painful or bleed during sexual intercourse. DIAGNOSIS  If wartlike lesions are present in the throat or genital warts are present, your health care provider can usually diagnose HPV by physical examination.   Genital warts are easily seen with the naked eye.  Currently, there is no FDA-approved test to detect HPV in males.  In females, a Pap test can show cells that are infected with HPV.  In females, a scope can be used to view the cervix (colposcopy). A colposcopy can be performed if the pelvic exam or Pap test is abnormal. A sample of tissue may be removed (biopsy) during the colposcopy. TREATMENT  There is no treatment for the virus itself. However, there are treatments for the health problems and symptoms HPV can cause.  Your health care provider will follow you closely after you are treated. This is because the HPV can come back and may need treatment again. Treatment of HPV may include:   Medicines, which may be injected or applied in a cream, lotion, or gel form.  Use of a probe to apply extreme cold (cryotherapy).  Application of an intense beam of light (laser treatment).  Use of a probe to apply extreme heat (electrocautery).  Surgery. HOME CARE INSTRUCTIONS   Take medicines only as directed by your health care provider.  Use over-the-counter creams for itching or irritation as directed by your health care provider.  Keep all follow-up visits as directed by your health care provider. This is important.  Do not touch or scratch the warts.  Do not treat genital warts with medicines used for treating hand warts.  Do not have sex while you are being treated.  Do not douche or use tampons during treatment of HPV.  Tell your sex partner about your infection because he or she may also need treatment.  If you become pregnant, tell your health care provider that you have had HPV. Your health care provider will monitor you closely during pregnancy to be sure your baby is safe.  After treatment, use condoms during sex to prevent future infections.  Have only one sex partner.  Have a sex partner who does not have other sex partners. PREVENTION   Talk to your health care provider about getting the HPV vaccines. These vaccines prevent some HPV infections and cancers.  It is recommended that the vaccine be given to males and females between the ages of 57 and 69 years old. It will not work if you already have HPV, and it is not recommended for pregnant women.  A Pap test is done to screen for cervical cancer in women.  The first Pap test should be done at age 29 years.  Between ages 51 and 54 years, Pap tests are repeated every 2 years.  Beginning at age 78, you are advised to have a Pap test every  3 years as long as your past 3 Pap tests have been normal.  Some women have medical problems that increase the chance of getting cervical cancer. Talk to your health care provider about these problems. It is especially important to talk to your health care provider if a new problem develops soon after your last Pap test. In these cases, your health care provider may recommend more frequent screening and Pap tests.  The above recommendations are the same for women who have or have not gotten the vaccine for HPV.  If you had a hysterectomy for a problem that was not a cancer or a condition that could lead to cancer, then you no longer need Pap tests. However, even if you no longer need a Pap test, a regular exam is a good idea to make sure no other problems are starting.   If you are between the ages of 85 and 23 years and you have had normal Pap tests going back 10 years, you no longer need Pap tests. However, even if you no longer need a Pap test, a regular exam is a good idea to make sure no other problems are starting.  If you have had past treatment for cervical cancer or a condition that could lead to cancer, you need Pap tests and screening for cancer for at least 20 years after your treatment.  If Pap tests have been discontinued, risk factors (such as a new sexual partner)need to be reassessed to determine if screening should be resumed.  Some women may need screenings more often if they are at high risk for cervical cancer. SEEK MEDICAL CARE IF:   The treated skin becomes red, swollen, or painful.  You have a fever.  You feel generally ill.  You feel lumps or pimple-like projections in and around your genital area.  You develop bleeding of the vagina or the treatment area.  You have painful sexual intercourse. MAKE SURE YOU:   Understand these instructions.  Will watch your condition.  Will get help if you are not doing well or get worse.   This information is not  intended to replace advice given to you by your health care provider. Make sure you discuss any questions you have with your health care provider.   Document Released: 06/05/2003 Document Revised: 04/05/2014 Document Reviewed: 06/20/2013 Elsevier Interactive Patient Education 2016 [place] patient hand-out, Women Staying Healthy, Active and Well from [place], with education on breast health, pap smears, heart and colon health.

## 2015-12-02 LAB — PAP LB AND HPV HIGH-RISK
HPV, high-risk: NEGATIVE
PAP Smear Comment: 0

## 2015-12-07 DIAGNOSIS — E669 Obesity, unspecified: Secondary | ICD-10-CM | POA: Insufficient documentation

## 2015-12-07 NOTE — Assessment & Plan Note (Signed)
Encouraged modest weight loss, little by little but cutting back on calories, consistent effort, dirnking adequate water, important to help with her pressure and glucose and lipids

## 2015-12-10 ENCOUNTER — Encounter: Payer: Self-pay | Admitting: *Deleted

## 2015-12-10 NOTE — Progress Notes (Signed)
Letter mailed to inform patient of her normal mammogram and need for short term follow up of HPV negative ASCUS.  Patient scheduled to return on 12/01/16 @ 9:00.

## 2016-07-06 ENCOUNTER — Encounter (INDEPENDENT_AMBULATORY_CARE_PROVIDER_SITE_OTHER): Payer: Self-pay

## 2016-07-06 ENCOUNTER — Ambulatory Visit: Payer: Self-pay | Admitting: Pharmacy Technician

## 2016-07-06 ENCOUNTER — Telehealth: Payer: Self-pay | Admitting: Pharmacy Technician

## 2016-07-06 DIAGNOSIS — Z79899 Other long term (current) drug therapy: Secondary | ICD-10-CM

## 2016-07-06 NOTE — Telephone Encounter (Signed)
Patient provided requested financial information.  Patient eligible to receive medication assistance through 2018, as long as patient's household income does not exceed 250% FPL or pt does not obtain insurance coverage.  [facility]

## 2016-07-06 NOTE — Progress Notes (Signed)
Completed [facility] application and contract.  Patient agreed to all terms of the [facility] contract.  Patient to provide utility bill and 2018 SS Award Letter.  Patient is receiving Social Security but not old enough to sign-up for Medicare.  Will be eligible for Medicare February 2020.    Provided patient with community resource material based on her particular needs.    Provided patient information about [place].  Patient is currently seeing a provider at [facility].  Patient stated that at this time she would like to remain a patient at [facility].    Patient is to follow-up with [place].  Patient is to follow-up with [facility].    [facility]

## 2016-12-01 ENCOUNTER — Encounter (INDEPENDENT_AMBULATORY_CARE_PROVIDER_SITE_OTHER): Payer: Self-pay

## 2016-12-01 ENCOUNTER — Ambulatory Visit
Admission: RE | Admit: 2016-12-01 | Discharge: 2016-12-01 | Disposition: A | Discharge status: Home / Self Care | Payer: Self-pay | Source: Ambulatory Visit | Attending: Oncology | Admitting: Oncology

## 2016-12-01 ENCOUNTER — Ambulatory Visit: Payer: Self-pay | Attending: Oncology

## 2016-12-01 VITALS — BP 170/97 | HR 66 | Temp 97.8°F | Ht 65.0 in | Wt 195.0 lb

## 2016-12-01 DIAGNOSIS — Z0000 Encounter for general adult medical examination without abnormal findings: Secondary | ICD-10-CM

## 2016-12-01 NOTE — Progress Notes (Signed)
Subjective:     Patient ID: Robin Daniels, female   DOB: 07/23/1955, 61 y.o.   MRN: [id number]  HPI   Review of Systems     Objective:   Physical Exam  Pulmonary/Chest: Right breast exhibits no inverted nipple, no mass, no nipple discharge, no skin change and no tenderness. Left breast exhibits no inverted nipple, no mass, no nipple discharge, no skin change and no tenderness. Breasts are symmetrical.  Genitourinary: No labial fusion. There is no rash, tenderness, lesion or injury on the right labia. There is no rash, tenderness, lesion or injury on the left labia. Uterus is not deviated, not enlarged, not fixed and not tender. Cervix exhibits no motion tenderness, no discharge and no friability. Right adnexum displays no mass, no tenderness and no fullness. Left adnexum displays no mass, no tenderness and no fullness. No erythema, tenderness or bleeding in the vagina. No foreign body in the vagina. No signs of injury around the vagina. Vaginal discharge found.       Assessment:     61 year old patient returns to [facility] for annual screening and repeat pap.  She had pap 11/27/15 with ASCUS/HPV negative results.  Patient screened, and meets BCCCP eligibility.  Patient does not have insurance, Medicare or Medicaid.  Handout given on Affordable Care Act.  Instructed patient on breast self-exam using teach back method.  CBE unremarkable.  No mass or lump palpated.  Pelvic exam normal. Small amount of white vaginal discharge noted. Patient lives with her 32 year old mother, and is her primary caregiver. Has 6 siblings.      Plan:     Sent for bilateral screening mammgram. Specimen collected for pap.

## 2016-12-04 LAB — PAP LB AND HPV HIGH-RISK
HPV, high-risk: NEGATIVE
PAP Smear Comment: 0

## 2016-12-15 NOTE — Progress Notes (Signed)
Phoned patient with [facility] mammogram results.  Reviewed ASCUS/negative HPV pap results.  Per Dr. Theora Gianotti and ASCCP guidelines patient requires repeat pap in 3 years.  Patient  Instructed to return for annual screening. Copy to [facility].

## 2017-05-18 ENCOUNTER — Telehealth: Payer: Self-pay | Admitting: Pharmacy Technician

## 2017-05-18 NOTE — Telephone Encounter (Signed)
Received updated proof of income.  Patient eligible to receive medication assistance at [facility] until 04/29/2018, as long as eligibility requirements continue to be met.  Patient will have Medicare A, B & D effective 04/29/2018.  [facility]

## 2017-05-18 NOTE — Telephone Encounter (Signed)
Received updated proof of income.  Patient eligible to receive medication assistance at [facility] until 05/27/17 as long as eligibility requirements continue to be met.  Robin Daniels Care Manager [facility] 

## 2018-07-30 ENCOUNTER — Emergency Department
Admission: EM | Admit: 2018-07-30 | Discharge: 2018-07-30 | Disposition: A | Discharge status: Home / Self Care | Payer: Medicare Other | Attending: Emergency Medicine | Admitting: Emergency Medicine

## 2018-07-30 ENCOUNTER — Encounter: Payer: Self-pay | Admitting: Emergency Medicine

## 2018-07-30 ENCOUNTER — Other Ambulatory Visit: Payer: Self-pay

## 2018-07-30 DIAGNOSIS — R109 Unspecified abdominal pain: Secondary | ICD-10-CM | POA: Insufficient documentation

## 2018-07-30 DIAGNOSIS — Z5321 Procedure and treatment not carried out due to patient leaving prior to being seen by health care provider: Secondary | ICD-10-CM | POA: Diagnosis not present

## 2018-07-30 LAB — COMPREHENSIVE METABOLIC PANEL
ALT: 14 U/L (ref 0–44)
AST: 17 U/L (ref 15–41)
Albumin: 4.1 g/dL (ref 3.5–5.0)
Alkaline Phosphatase: 80 U/L (ref 38–126)
Anion gap: 9 (ref 5–15)
BUN: 12 mg/dL (ref 8–23)
CO2: 27 mmol/L (ref 22–32)
Calcium: 9 mg/dL (ref 8.9–10.3)
Chloride: 102 mmol/L (ref 98–111)
Creatinine, Ser: 0.79 mg/dL (ref 0.44–1.00)
GFR calc Af Amer: >60 mL/min (ref >60)
GFR calc non Af Amer: >60 mL/min (ref >60)
Glucose, Bld: 106 mg/dL — ABNORMAL HIGH (ref 70–99)
Potassium: 3.7 mmol/L (ref 3.5–5.1)
Sodium: 138 mmol/L (ref 135–145)
Total Bilirubin: 0.4 mg/dL (ref 0.3–1.2)
Total Protein: 7.8 g/dL (ref 6.5–8.1)

## 2018-07-30 LAB — CBC
HCT: 34 % — ABNORMAL LOW (ref 36.0–46.0)
Hemoglobin: 10.5 g/dL — ABNORMAL LOW (ref 12.0–15.0)
MCH: 22 pg — ABNORMAL LOW (ref 26.0–34.0)
MCHC: 30.9 g/dL (ref 30.0–36.0)
MCV: 71.3 fL — ABNORMAL LOW (ref 80.0–100.0)
Platelets: 343 10*3/uL (ref 150–400)
RBC: 4.77 MIL/uL (ref 3.87–5.11)
RDW: 15.9 % — ABNORMAL HIGH (ref 11.5–15.5)
WBC: 4.8 10*3/uL (ref 4.0–10.5)
nRBC: 0 % (ref 0.0–0.2)

## 2018-07-30 LAB — LIPASE, BLOOD: Lipase: 27 U/L (ref 11–51)

## 2018-07-30 NOTE — ED Triage Notes (Signed)
Still no answer, have not seen pt.

## 2018-07-30 NOTE — ED Triage Notes (Signed)
Pt seen walking in parking lot by this RN  Appears to be leaving ED. Will watch to see if returns.

## 2018-07-30 NOTE — ED Triage Notes (Signed)
Called no answer

## 2018-07-30 NOTE — ED Triage Notes (Signed)
Called pt with no answer.

## 2018-07-30 NOTE — ED Triage Notes (Addendum)
Pt arrives with complaints of right sided abdominal pain for "the last few weeks." Pt states the pain feels sharp. pt states she went to [facility] who sent her to the ED for a CT scan. Pt denies n/v/d.

## 2018-08-02 ENCOUNTER — Emergency Department
Admission: EM | Admit: 2018-08-02 | Discharge: 2018-08-02 | Disposition: A | Discharge status: Home / Self Care | Payer: Medicare Other | Attending: Emergency Medicine | Admitting: Emergency Medicine

## 2018-08-02 ENCOUNTER — Emergency Department: Payer: Medicare Other

## 2018-08-02 ENCOUNTER — Encounter: Payer: Self-pay | Admitting: Emergency Medicine

## 2018-08-02 ENCOUNTER — Other Ambulatory Visit: Payer: Self-pay

## 2018-08-02 DIAGNOSIS — M47816 Spondylosis without myelopathy or radiculopathy, lumbar region: Secondary | ICD-10-CM | POA: Diagnosis not present

## 2018-08-02 DIAGNOSIS — M545 Low back pain: Secondary | ICD-10-CM | POA: Diagnosis present

## 2018-08-02 DIAGNOSIS — R109 Unspecified abdominal pain: Secondary | ICD-10-CM | POA: Diagnosis not present

## 2018-08-02 DIAGNOSIS — Z96651 Presence of right artificial knee joint: Secondary | ICD-10-CM | POA: Diagnosis not present

## 2018-08-02 DIAGNOSIS — Z79899 Other long term (current) drug therapy: Secondary | ICD-10-CM | POA: Diagnosis not present

## 2018-08-02 DIAGNOSIS — M549 Dorsalgia, unspecified: Secondary | ICD-10-CM

## 2018-08-02 DIAGNOSIS — I10 Essential (primary) hypertension: Secondary | ICD-10-CM | POA: Diagnosis not present

## 2018-08-02 DIAGNOSIS — E119 Type 2 diabetes mellitus without complications: Secondary | ICD-10-CM | POA: Insufficient documentation

## 2018-08-02 LAB — COMPREHENSIVE METABOLIC PANEL
ALT: 15 U/L (ref 0–44)
AST: 16 U/L (ref 15–41)
Albumin: 3.8 g/dL (ref 3.5–5.0)
Alkaline Phosphatase: 75 U/L (ref 38–126)
Anion gap: 8 (ref 5–15)
BUN: 12 mg/dL (ref 8–23)
CO2: 26 mmol/L (ref 22–32)
Calcium: 9.2 mg/dL (ref 8.9–10.3)
Chloride: 104 mmol/L (ref 98–111)
Creatinine, Ser: 0.84 mg/dL (ref 0.44–1.00)
GFR calc Af Amer: >60 mL/min (ref >60)
GFR calc non Af Amer: >60 mL/min (ref >60)
Glucose, Bld: 109 mg/dL — ABNORMAL HIGH (ref 70–99)
Potassium: 3.6 mmol/L (ref 3.5–5.1)
Sodium: 138 mmol/L (ref 135–145)
Total Bilirubin: 0.6 mg/dL (ref 0.3–1.2)
Total Protein: 7.8 g/dL (ref 6.5–8.1)

## 2018-08-02 LAB — URINALYSIS, COMPLETE (UACMP) WITH MICROSCOPIC
Bilirubin Urine: NEGATIVE
Bilirubin Urine: NEGATIVE
Glucose, UA: NEGATIVE mg/dL
Glucose, UA: NEGATIVE mg/dL
Ketones, ur: NEGATIVE mg/dL
Ketones, ur: NEGATIVE mg/dL
Leukocytes,Ua: NEGATIVE
Leukocytes,Ua: NEGATIVE
Nitrite: NEGATIVE
Nitrite: NEGATIVE
Protein, ur: NEGATIVE mg/dL
Protein, ur: NEGATIVE mg/dL
Specific Gravity, Urine: 1.014 (ref 1.005–1.030)
Specific Gravity, Urine: 1.015 (ref 1.005–1.030)
pH: 6 (ref 5.0–8.0)
pH: 6 (ref 5.0–8.0)

## 2018-08-02 LAB — CBC
HCT: 34.4 % — ABNORMAL LOW (ref 36.0–46.0)
Hemoglobin: 10.7 g/dL — ABNORMAL LOW (ref 12.0–15.0)
MCH: 22.1 pg — ABNORMAL LOW (ref 26.0–34.0)
MCHC: 31.1 g/dL (ref 30.0–36.0)
MCV: 70.9 fL — ABNORMAL LOW (ref 80.0–100.0)
Platelets: 370 10*3/uL (ref 150–400)
RBC: 4.85 MIL/uL (ref 3.87–5.11)
RDW: 16 % — ABNORMAL HIGH (ref 11.5–15.5)
WBC: 4.2 10*3/uL (ref 4.0–10.5)
nRBC: 0 % (ref 0.0–0.2)

## 2018-08-02 LAB — LIPASE, BLOOD: Lipase: 22 U/L (ref 11–51)

## 2018-08-02 MED ORDER — AMLODIPINE BESYLATE 5 MG PO TABS: 5.0000 mg | ORAL_TABLET | Freq: Every day | ORAL | 1 refills | Status: DC

## 2018-08-02 MED ORDER — KETOROLAC TROMETHAMINE 30 MG/ML IJ SOLN
15.0000 mg | Freq: Once | INTRAMUSCULAR | Status: AC
  Administered 2018-08-02: 15 mg via INTRAVENOUS
  Filled 2018-08-02: qty 1

## 2018-08-02 MED ORDER — TRAMADOL HCL 50 MG PO TABS: 50.0000 mg | ORAL_TABLET | Freq: Four times a day (QID) | ORAL | 0 refills | Status: AC | PRN

## 2018-08-02 MED ORDER — LIDOCAINE 5 % EX PTCH
1.0000 | MEDICATED_PATCH | CUTANEOUS | Status: DC
  Administered 2018-08-02: 12:00:00 1 via TRANSDERMAL
  Filled 2018-08-02: qty 1

## 2018-08-02 MED ORDER — BACLOFEN 10 MG PO TABS: 5.0000 mg | ORAL_TABLET | Freq: Three times a day (TID) | ORAL | 0 refills | Status: AC | PRN

## 2018-08-02 NOTE — ED Provider Notes (Signed)
[facility] Emergency Department Provider Note  ____________________________________________  Time seen: Approximately 11:03 AM  I have reviewed the triage vital signs and the nursing notes.   HISTORY  Chief Complaint Back Pain    HPI Robin Daniels is a 63 y.o. female that presents emergency department for evaluation of left-sided mid to low back pain for 2 days.  Patient presented to the emergency department 3 days ago for abdominal pain and left without being seen.  No history of kidney stones.  Patient states that current blood pressure is normal for her.  She has been off of her blood pressure medications for about 1 year.  She is unsure which medications she was on previously but was on 2 medications.  She states that she has a primary care provider but has not been able to make an appointment with them.  No fever, nausea, vomiting, urinary symptoms.   Past Medical History:  Diagnosis Date  . Anemia    history of  . Anxiety 09/11/2014  . Arthritis   . Diabetes mellitus without complication ([place])   . Fluttering heart   . Hyperlipidemia   . Hypertension     Patient Active Problem List   Diagnosis Date Noted  . Obesity 12/07/2015  . Heart palpitations 02/18/2015  . Hypertension goal BP (blood pressure) < 140/90 10/17/2014  . Pure hypercholesterolemia 10/17/2014  . H/O total knee replacement 10/17/2014  . Encounter for general adult medical examination without abnormal findings 09/11/2014  . Anxiety 09/11/2014  . HLD (hyperlipidemia) 09/11/2014  . Migraine without aura and responsive to treatment 09/11/2014  . Nocturnal cough 09/11/2014  . Anemia, iron deficiency 09/11/2014  . Plantar fasciitis 09/11/2014  . IFG (impaired fasting glucose) 09/11/2014  . Encounter for screening for malignant neoplasm of colon 09/11/2014  . Avitaminosis D 09/11/2014  . Absence of interventricular septum 09/11/2014    Past Surgical History:  Procedure Laterality  Date  . right knee replacement Right [id number]  . SHOULDER ARTHROSCOPY W/ ROTATOR CUFF REPAIR Right     Prior to Admission medications   Medication Sig Start Date End Date Taking? Authorizing Provider  amLODipine (NORVASC) 5 MG tablet Take 1 tablet (5 mg total) by mouth daily. 08/02/18 08/02/19  Laban Emperor, PA-C  baclofen (LIORESAL) 10 MG tablet Take 0.5 tablets (5 mg total) by mouth 3 (three) times daily as needed for muscle spasms. 08/02/18 08/02/19  Laban Emperor, PA-C  traMADol (ULTRAM) 50 MG tablet Take 1 tablet (50 mg total) by mouth every 6 (six) hours as needed. 08/02/18 08/02/19  Laban Emperor, PA-C    Allergies Patient has no known allergies.  Family History  Problem Relation Age of Onset  . Diabetes Mother   . Hyperlipidemia Mother   . Hypertension Mother   . Vision loss Mother   . Arthritis Father   . Hyperlipidemia Father   . Hypertension Father   . Breast cancer Maternal Aunt     Social History Social History   Tobacco Use  . Smoking status: Never Smoker  . Smokeless tobacco: Never Used  Substance Use Topics  . Alcohol use: No    Alcohol/week: 0.0 standard drinks  . Drug use: No     Review of Systems  Constitutional: No fever/chills Cardiovascular: No chest pain. Respiratory: No SOB. Gastrointestinal: No abdominal pain today.  No nausea, no vomiting.  Genitourinary: Negative for dysuria, urgency, frequency, hematuria. Musculoskeletal: Positive for back pain. Skin: Negative for rash, abrasions, lacerations, ecchymosis. Neurological: Negative for headaches  ____________________________________________   PHYSICAL EXAM:  VITAL SIGNS: ED Triage Vitals  Enc Vitals Group     BP 08/02/18 0959 (!) 193/94     Pulse Rate 08/02/18 0959 92     Resp 08/02/18 0959 16     Temp 08/02/18 0959 97.9 F (36.6 C)     Temp Source 08/02/18 0959 Oral     SpO2 08/02/18 0959 98 %     Weight 08/02/18 0955 214 lb (97.1 kg)     Height 08/02/18 0955 5\' 4"  (1.626 m)      Head Circumference --      Peak Flow --      Pain Score 08/02/18 0955 6     Pain Loc --      Pain Edu? --      Excl. in [place]? --      Constitutional: Alert and oriented. Well appearing and in no acute distress. Eyes: Conjunctivae are normal. PERRL. EOMI. Head: Atraumatic. ENT:      Ears:      Nose: No congestion/rhinnorhea.      Mouth/Throat: Mucous membranes are moist.  Neck: No stridor. Cardiovascular: Normal rate, regular rhythm.  Good peripheral circulation. Respiratory: Normal respiratory effort without tachypnea or retractions. Lungs CTAB. Good air entry to the bases with no decreased or absent breath sounds. Gastrointestinal: Bowel sounds 4 quadrants. Soft and nontender to palpation. No guarding or rigidity. No palpable masses. No distention.  Tenderness to palpation to left mid back. Musculoskeletal: Full range of motion to all extremities. No gross deformities appreciated. Neurologic:  Normal speech and language. No gross focal neurologic deficits are appreciated.  Skin:  Skin is warm, dry and intact. No rash noted. Psychiatric: Mood and affect are normal. Speech and behavior are normal. Patient exhibits appropriate insight and judgement.   ____________________________________________   LABS (all labs ordered are listed, but only abnormal results are displayed)  Labs Reviewed  URINALYSIS, COMPLETE (UACMP) WITH MICROSCOPIC - Abnormal; Notable for the following components:      Result Value   Color, Urine YELLOW (*)    APPearance HAZY (*)    Hgb urine dipstick SMALL (*)    All other components within normal limits  COMPREHENSIVE METABOLIC PANEL - Abnormal; Notable for the following components:   Glucose, Bld 109 (*)    All other components within normal limits  CBC - Abnormal; Notable for the following components:   Hemoglobin 10.7 (*)    HCT 34.4 (*)    MCV 70.9 (*)    MCH 22.1 (*)    RDW 16.0 (*)    All other components within normal limits  URINALYSIS,  COMPLETE (UACMP) WITH MICROSCOPIC - Abnormal; Notable for the following components:   Color, Urine YELLOW (*)    APPearance HAZY (*)    Hgb urine dipstick SMALL (*)    All other components within normal limits  LIPASE, BLOOD   ____________________________________________  EKG   ____________________________________________  RADIOLOGY Robinette Haines, personally viewed and evaluated these images (plain radiographs) as part of my medical decision making, as well as reviewing the written report by the radiologist.  Ct L-spine No Charge  Result Date: 08/02/2018 CLINICAL DATA:  Right-sided low back pain, 3 weeks duration. EXAM: CT LUMBAR SPINE WITHOUT CONTRAST TECHNIQUE: Multidetector CT imaging of the lumbar spine was performed without intravenous contrast administration. Multiplanar CT image reconstructions were also generated. COMPARISON:  None. FINDINGS: Segmentation: 5 lumbar type vertebral bodies. Alignment: 2 mm degenerative anterolisthesis L3-4 and L4-5. Vertebrae: No  fracture or primary bone lesion. Paraspinal and other soft tissues: Negative Disc levels: No abnormality at L1-2. L2-3: Mild facet osteoarthritis.  No disc pathology.  No stenosis. L3-4: Facet osteoarthritis with 2 mm of anterolisthesis. Bulging of the disc. Narrowing of the lateral recesses which could possibly cause neural compression. The facet arthropathy could be a cause of back pain or referred facet syndrome pain. L4-5: Facet osteoarthritis with 2 mm of anterolisthesis. Bulging of the disc. Narrowing of the lateral recesses and foramina which could possibly cause neural compression. Facet arthropathy could be a cause of back pain or referred facet syndrome pain. L5-S1: Facet osteoarthritis worse on the right. No slippage. No disc pathology. No canal or foraminal stenosis. The facet arthropathy could be a cause of back pain or referred facet syndrome pain, particularly on the right. Bilateral sacroiliac osteoarthritis which  could be painful. IMPRESSION: Facet arthropathy in the lower lumbar spine from L2-3 through L5-S1. At L3-4 and L4-5, the facet arthropathy allows 2 mm of anterolisthesis. There is narrowing of the lateral recesses and foramina that could possibly cause neural compression. Certainly, the facet arthropathy could be a cause of back pain or referred facet syndrome pain as well. At L5-S1, there is facet arthropathy worse on the right but no slippage. No apparent canal or foraminal stenosis, but as above, the facet arthropathy could be a cause of back pain or referred facet syndrome pain. Bilateral sacroiliac osteoarthritis which could be painful. Electronically Signed   By: Nelson Chimes M.D.   On: 08/02/2018 11:50   Ct Renal Stone Study  Result Date: 08/02/2018 CLINICAL DATA:  Right-sided low back pain, 3 weeks duration. EXAM: CT ABDOMEN AND PELVIS WITHOUT CONTRAST TECHNIQUE: Multidetector CT imaging of the abdomen and pelvis was performed following the standard protocol without IV contrast. COMPARISON:  None. FINDINGS: Lower chest: Heart size is normal. Lung bases are clear. Incidental right posterolateral pleural lipoma. Hepatobiliary: Liver parenchyma is normal. Calcified gallstones dependent in the gallbladder. No CT evidence of cholecystitis or obstruction. Pancreas: Normal Spleen: Normal Adrenals/Urinary Tract: Adrenal glands are normal. Kidneys are normal. Bladder is normal. No evidence of urinary tract stone disease. No hydronephrosis. No passing stone. Stomach/Bowel: Normal. No evidence of obstruction. No evidence of mass or inflammatory disease. Normal appearing appendix. Vascular/Lymphatic: Aortic atherosclerosis. No aneurysm. IVC is normal. No retroperitoneal adenopathy. Reproductive: Previous hysterectomy.  No pelvic mass. Other: No free fluid or air. Musculoskeletal: Lower lumbar degenerative changes. See results of lumbar CT. IMPRESSION: No evidence of urinary tract stone disease. No significant  intraabdominal organ pathology. Aortic atherosclerosis without aneurysm. See results of lumbar CT reported separately. Incidental right pleural lipoma. Electronically Signed   By: Nelson Chimes M.D.   On: 08/02/2018 11:46    ____________________________________________    PROCEDURES  Procedure(s) performed:    Procedures    Medications  lidocaine (LIDODERM) 5 % 1 patch (1 patch Transdermal Patch Applied 08/02/18 1144)  ketorolac (TORADOL) 30 MG/ML injection 15 mg (15 mg Intravenous Given 08/02/18 1144)     ____________________________________________   INITIAL IMPRESSION / ASSESSMENT AND PLAN / ED COURSE  Pertinent labs & imaging results that were available during my care of the patient were reviewed by me and considered in my medical decision making (see chart for details).  Review of the [place] CSRS was performed in accordance of the [place] prior to dispensing any controlled drugs.     Patient presenting the emergency department for evaluation of mid to low back pain.  Vital signs and  exam are reassuring.  CT consistent with osteoarthritis.  CT negative for ureteral stone or significant intra-abdominal pathology per radiology.  Pain is likely musculoskeletal from osteoarthritis.  No indication of infection on urinalysis.  Patient's blood pressure is elevated in the emergency department.  Patient states that it is been about 1 year since she last took any blood pressure medications.  She was on amlodipine, lisinopril, with hydrochlorothiazide in the [place] for blood pressure.  She states that she has been unable to follow-up with primary care for the last year for refill of her medications.  I have given patient a prescription for amlodipine.  She is aware that she will need to have her blood pressure rechecked and will likely need additional medications.  She is understanding that she needs to follow-up with primary care.  She will be given a prescription for baclofen and tramadol for low  back pain.  Patient will be discharged home with prescriptions for baclofen, tramadol, amlodipine. Patient is to follow up with primary care as directed. Patient is given ED precautions to return to the ED for any worsening or new symptoms.     ____________________________________________  FINAL CLINICAL IMPRESSION(S) / ED DIAGNOSES  Final diagnoses:  Back pain  Osteoarthritis of lumbar spine, unspecified spinal osteoarthritis complication status  Essential hypertension      NEW MEDICATIONS STARTED DURING THIS VISIT:  ED Discharge Orders         Ordered    baclofen (LIORESAL) 10 MG tablet  3 times daily PRN     08/02/18 1229    traMADol (ULTRAM) 50 MG tablet  Every 6 hours PRN     08/02/18 1229    amLODipine (NORVASC) 5 MG tablet  Daily     08/02/18 1234              This chart was dictated using voice recognition software/Dragon. Despite best efforts to proofread, errors can occur which can change the meaning. Any change was purely unintentional.    Laban Emperor, PA-C 08/02/18 1610    Burlene Arnt Gerda Diss, MD 08/04/18 [phone]

## 2018-08-02 NOTE — ED Triage Notes (Signed)
Pt here with c/o right lower back pain for about 3 weeks now, walked to triage with no issues. NAD.

## 2018-08-02 NOTE — ED Notes (Signed)
See triage note  Presents with a 3 week hx of right flank/lower back pain  Denies any urinary sx's or injury  Unsure how to describes the pain  Ambulates well to treatment room

## 2018-08-02 NOTE — Discharge Instructions (Signed)
Your CT shows that you have arthritis in your back.  Please take baclofen to help relax your muscles.  You can also take tramadol for pain.  Use a heating pad.  Your blood pressure is also elevated in the emergency department.  I have written you a prescription for one of your blood pressure medications.  You will need additional blood pressure medication and will need to see primary care for this so that they can recheck your blood pressure. It would be helpful for them for you to keep a log of your blood pressures. Please be sure to make an appointment with primary care to have your blood pressure rechecked and for management of your blood pressure medication.  An elevated blood pressure puts you at increased risk for a stroke, heart attack, organ damage.  Return to the emergency department for headache, shortness of breath, chest pain.

## 2019-09-08 ENCOUNTER — Emergency Department
Admission: EM | Admit: 2019-09-08 | Discharge: 2019-09-08 | Disposition: A | Discharge status: Home / Self Care | Payer: Medicare Other | Attending: Emergency Medicine | Admitting: Emergency Medicine

## 2019-09-08 ENCOUNTER — Other Ambulatory Visit: Payer: Self-pay

## 2019-09-08 ENCOUNTER — Emergency Department: Payer: Medicare Other

## 2019-09-08 DIAGNOSIS — I10 Essential (primary) hypertension: Secondary | ICD-10-CM | POA: Insufficient documentation

## 2019-09-08 DIAGNOSIS — R0789 Other chest pain: Secondary | ICD-10-CM

## 2019-09-08 DIAGNOSIS — M546 Pain in thoracic spine: Secondary | ICD-10-CM | POA: Diagnosis not present

## 2019-09-08 DIAGNOSIS — E119 Type 2 diabetes mellitus without complications: Secondary | ICD-10-CM | POA: Diagnosis not present

## 2019-09-08 MED ORDER — KETOROLAC TROMETHAMINE 30 MG/ML IJ SOLN
30.0000 mg | Freq: Once | INTRAMUSCULAR | Status: AC
  Administered 2019-09-08: 30 mg via INTRAMUSCULAR
  Filled 2019-09-08: qty 1

## 2019-09-08 MED ORDER — TRAMADOL HCL 50 MG PO TABS: 50.0000 mg | ORAL_TABLET | Freq: Four times a day (QID) | ORAL | 0 refills | Status: DC | PRN

## 2019-09-08 MED ORDER — METHOCARBAMOL 500 MG PO TABS
500.0000 mg | ORAL_TABLET | Freq: Once | ORAL | Status: AC
  Administered 2019-09-08: 500 mg via ORAL
  Filled 2019-09-08: qty 1

## 2019-09-08 MED ORDER — METHOCARBAMOL 500 MG PO TABS: 500.0000 mg | ORAL_TABLET | Freq: Four times a day (QID) | ORAL | 0 refills | Status: DC

## 2019-09-08 NOTE — Discharge Instructions (Signed)
Follow-up with your primary care provider if any continued problems or concerns.  You may use ice or heat to your back as needed for comfort.  The medication that was given to you in the ED was the muscle relaxant and an anti-inflammatory.  A prescription for methocarbamol which is the muscle relaxant and a prescription for tramadol was sent to your pharmacy.  The tramadol is for pain.  This medication in addition to your muscle relaxant may cause drowsiness and increase your risk for falling.  You may also take Tylenol or ibuprofen with this medication if additional pain relief is needed.  Return to the emergency department if any severe worsening of your symptoms such as shortness of breath, chest pain, difficulty breathing.

## 2019-09-08 NOTE — ED Provider Notes (Signed)
[facility] Emergency Department Provider Note   ____________________________________________   First MD Initiated Contact with Patient 09/08/19 1144     (approximate)  I have reviewed the triage vital signs and the nursing notes.   HISTORY  Chief Complaint Back Pain   HPI Robin Daniels is a 64 y.o. female presents to the ED with complaint of left upper and mid back pain that began this morning.  Patient states that she is unaware of any injury but states that she has been taking out some garbage.  She denies any shortness of breath or chest pain.  There is been no diaphoresis, nausea, vomiting or shortness of breath.  Patient states that when she takes a deep breath the 1 area in her back hurts more.  She has taken Tylenol at 9 AM without complete relief of her pain.  She denies any cough, fever, chills.  She rates her pain as a 9/10.      Past Medical History:  Diagnosis Date  . Anemia    history of  . Anxiety 09/11/2014  . Arthritis   . Diabetes mellitus without complication ([place])   . Fluttering heart   . Hyperlipidemia   . Hypertension     Patient Active Problem List   Diagnosis Date Noted  . Obesity 12/07/2015  . Heart palpitations 02/18/2015  . Hypertension goal BP (blood pressure) < 140/90 10/17/2014  . Pure hypercholesterolemia 10/17/2014  . H/O total knee replacement 10/17/2014  . Encounter for general adult medical examination without abnormal findings 09/11/2014  . Anxiety 09/11/2014  . HLD (hyperlipidemia) 09/11/2014  . Migraine without aura and responsive to treatment 09/11/2014  . Nocturnal cough 09/11/2014  . Anemia, iron deficiency 09/11/2014  . Plantar fasciitis 09/11/2014  . IFG (impaired fasting glucose) 09/11/2014  . Encounter for screening for malignant neoplasm of colon 09/11/2014  . Avitaminosis D 09/11/2014  . 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  right knee replacement Right [id number]  . SHOULDER ARTHROSCOPY W/ ROTATOR CUFF REPAIR Right     Prior to Admission medications   Medication Sig Start Date End Date Taking? Authorizing Provider  amLODipine (NORVASC) 5 MG tablet Take 1 tablet (5 mg total) by mouth daily. 08/02/18 08/02/19  Laban Emperor, PA-C  methocarbamol (ROBAXIN) 500 MG tablet Take 1 tablet (500 mg total) by mouth 4 (four) times daily. 09/08/19   Johnn Hai, PA-C  traMADol (ULTRAM) 50 MG tablet Take 1 tablet (50 mg total) by mouth every 6 (six) hours as needed. 09/08/19   Johnn Hai, PA-C    Allergies Patient has no known allergies.  Family History  Problem Relation Age of Onset  . Diabetes Mother   . Hyperlipidemia Mother   . Hypertension Mother   . Vision loss Mother   . Arthritis Father   . Hyperlipidemia Father   . Hypertension Father   . Breast cancer Maternal Aunt     Social History Social History   Tobacco Use  . Smoking status: Never Smoker  . Smokeless tobacco: Never Used  Substance Use Topics  . Alcohol use: No    Alcohol/week: 0.0 standard drinks  . Drug use: No    Review of Systems Constitutional: No fever/chills Eyes: No visual changes. ENT: No sore throat. Cardiovascular: Denies chest pain. Respiratory:   Negative for cough or shortness of breath. Gastrointestinal: No abdominal pain.  No nausea, no vomiting.   Genitourinary: Negative  for dysuria. Musculoskeletal: Positive for left and mid left sided back pain. Skin: Negative for rash. Neurological: Negative for headaches, focal weakness or numbness. ___________________________________________   PHYSICAL EXAM:  VITAL SIGNS: ED Triage Vitals [09/08/19 1107]  Enc Vitals Group     BP (!) 160/72     Pulse Rate 76     Resp 18     Temp 98.3 F (36.8 C)     Temp Source Oral     SpO2 100 %     Weight 212 lb (96.2 kg)     Height 5\' 4"  (1.626 m)     Head Circumference      Peak Flow      Pain Score 9     Pain Loc       Pain Edu?      Excl. in [place]?    Constitutional: Alert and oriented. Well appearing and in no acute distress. Eyes: Conjunctivae are normal.  Head: Atraumatic. Nose: No congestion/rhinnorhea. Mouth/Throat: Mucous membranes are moist.  Oropharynx non-erythematous. Neck: No stridor.   Cardiovascular: Normal rate, regular rhythm. Grossly normal heart sounds.  Good peripheral circulation. Respiratory: Normal respiratory effort.  No retractions. Lungs CTAB. Gastrointestinal: Soft and nontender. No distention.  Musculoskeletal: On examination of the back there is no point tenderness on palpation of the thoracic or upper lumbar spine.  There is some minimal periscapular and posterior rib tenderness.  No edema or evidence of injury is noted.  Pain is reproduced with exertion of the left upper shoulder.  Pain is also increased with deep inspiration and localized in the same area. Neurologic:  Normal speech and language. No gross focal neurologic deficits are appreciated.  Skin:  Skin is warm, dry and intact.  Psychiatric: Mood and affect are normal. Speech and behavior are normal.  ____________________________________________   LABS (all labs ordered are listed, but only abnormal results are displayed)  Labs Reviewed - No data to display ____________________________________________  EKG Showed normal sinus rhythm with ventricular rate of 79.  PR interval 156, QRS duration 110.  Nonspecific T wave abnormality.  ____________________________________________  RADIOLOGY  Official radiology report(s): DG Chest 2 View  Result Date: 09/08/2019 CLINICAL DATA:  Chest pain EXAM: CHEST - 2 VIEW COMPARISON:  March 26, 2008 FINDINGS: Lungs are clear. The heart size and pulmonary vascularity are normal. No adenopathy. Aorta is mildly tortuous. No bone lesions. IMPRESSION: No edema or airspace opacity. Cardiac silhouette within normal limits. Electronically Signed   By: Lowella Grip III M.D.   On:  09/08/2019 12:00    ____________________________________________   PROCEDURES  Procedure(s) performed (including Critical Care):  Procedures   ____________________________________________   INITIAL IMPRESSION / ASSESSMENT AND PLAN / ED COURSE  As part of my medical decision making, I reviewed the following data within the electronic MEDICAL RECORD NUMBER Notes from prior ED visits and [place] Controlled Substance Database   64 year old female presents to the ED with complaint of left posterior chest wall pain that began this morning when she got up.  Patient cannot recall an actual accident or injury but states that she has been taking trash out.  She denies any other cardiac issues, nausea, vomiting or diaphoresis.  Pain is reproduced with range of motion and also deep inspiration.  Patient states that she took Tylenol prior to arrival which has helped a little bit.  Chest x-ray showed no acute cardiopulmonary disease.  EKG was stable.  Patient was given Robaxin 500 mg and an injection of Toradol while  in the emergency department.  She was discharged with a prescription to continue on the methocarbamol and a prescription for tramadol if needed for moderate pain.  She is aware that she can take Tylenol or ibuprofen with this medication.  She is to follow-up with her PCP or return to the emergency department if any severe worsening of her symptoms.  ____________________________________________   FINAL CLINICAL IMPRESSION(S) / ED DIAGNOSES  Final diagnoses:  Chest pain, musculoskeletal     ED Discharge Orders         Ordered    traMADol (ULTRAM) 50 MG tablet  Every 6 hours PRN     Discontinue  Reprint     09/08/19 1219    methocarbamol (ROBAXIN) 500 MG tablet  4 times daily     Discontinue  Reprint     09/08/19 1219           Note:  This document was prepared using Dragon voice recognition software and may include unintentional dictation errors.    Johnn Hai, PA-C 09/08/19  1503    Arta Silence, MD 09/08/19 1511

## 2019-09-08 NOTE — ED Notes (Signed)
Pt reports pain in upper/mid left side of back since this am  Denies [facility] or chest pain  Denies injury or strain

## 2019-09-08 NOTE — ED Triage Notes (Signed)
Pt comes POV with left upper side/back pain starting this morning that hurts worse when she takes in deep breaths.

## 2019-09-12 DIAGNOSIS — M160 Bilateral primary osteoarthritis of hip: Secondary | ICD-10-CM | POA: Insufficient documentation

## 2019-11-05 ENCOUNTER — Other Ambulatory Visit: Payer: Self-pay

## 2019-11-09 ENCOUNTER — Telehealth: Payer: Medicare Other

## 2019-11-09 ENCOUNTER — Encounter: Payer: Self-pay | Admitting: General Practice

## 2019-11-09 ENCOUNTER — Telehealth: Payer: Self-pay

## 2019-11-09 NOTE — Telephone Encounter (Signed)
Unable to contact patient to schedule her colonoscopy.  No voicemail available to LVM.  Thanks,  [place], [place]

## 2019-11-14 ENCOUNTER — Encounter: Payer: Self-pay | Admitting: Emergency Medicine

## 2019-11-14 ENCOUNTER — Emergency Department: Payer: Medicare Other

## 2019-11-14 ENCOUNTER — Other Ambulatory Visit: Payer: Self-pay

## 2019-11-14 ENCOUNTER — Inpatient Hospital Stay
Admission: EM | Admit: 2019-11-14 | Discharge: 2019-11-17 | DRG: 446 | Disposition: A | Discharge status: Home / Self Care | Payer: Medicare Other | Attending: Internal Medicine | Admitting: Internal Medicine

## 2019-11-14 ENCOUNTER — Inpatient Hospital Stay: Payer: Medicare Other

## 2019-11-14 DIAGNOSIS — K8021 Calculus of gallbladder without cholecystitis with obstruction: Secondary | ICD-10-CM

## 2019-11-14 DIAGNOSIS — Z20822 Contact with and (suspected) exposure to covid-19: Secondary | ICD-10-CM | POA: Diagnosis present

## 2019-11-14 DIAGNOSIS — Z6836 Body mass index (BMI) 36.0-36.9, adult: Secondary | ICD-10-CM | POA: Diagnosis not present

## 2019-11-14 DIAGNOSIS — I10 Essential (primary) hypertension: Secondary | ICD-10-CM | POA: Diagnosis present

## 2019-11-14 DIAGNOSIS — Z8249 Family history of ischemic heart disease and other diseases of the circulatory system: Secondary | ICD-10-CM | POA: Diagnosis not present

## 2019-11-14 DIAGNOSIS — Z8261 Family history of arthritis: Secondary | ICD-10-CM | POA: Diagnosis not present

## 2019-11-14 DIAGNOSIS — K831 Obstruction of bile duct: Secondary | ICD-10-CM | POA: Diagnosis present

## 2019-11-14 DIAGNOSIS — K8020 Calculus of gallbladder without cholecystitis without obstruction: Secondary | ICD-10-CM | POA: Diagnosis present

## 2019-11-14 DIAGNOSIS — F419 Anxiety disorder, unspecified: Secondary | ICD-10-CM | POA: Diagnosis present

## 2019-11-14 DIAGNOSIS — E119 Type 2 diabetes mellitus without complications: Secondary | ICD-10-CM | POA: Diagnosis present

## 2019-11-14 DIAGNOSIS — R109 Unspecified abdominal pain: Secondary | ICD-10-CM

## 2019-11-14 DIAGNOSIS — Z79899 Other long term (current) drug therapy: Secondary | ICD-10-CM | POA: Diagnosis not present

## 2019-11-14 DIAGNOSIS — Z96651 Presence of right artificial knee joint: Secondary | ICD-10-CM | POA: Diagnosis present

## 2019-11-14 DIAGNOSIS — K869 Disease of pancreas, unspecified: Secondary | ICD-10-CM | POA: Diagnosis present

## 2019-11-14 DIAGNOSIS — E876 Hypokalemia: Secondary | ICD-10-CM | POA: Diagnosis present

## 2019-11-14 DIAGNOSIS — K8689 Other specified diseases of pancreas: Secondary | ICD-10-CM | POA: Diagnosis not present

## 2019-11-14 DIAGNOSIS — D509 Iron deficiency anemia, unspecified: Secondary | ICD-10-CM

## 2019-11-14 DIAGNOSIS — Z803 Family history of malignant neoplasm of breast: Secondary | ICD-10-CM

## 2019-11-14 DIAGNOSIS — Z833 Family history of diabetes mellitus: Secondary | ICD-10-CM

## 2019-11-14 DIAGNOSIS — Z83438 Family history of other disorder of lipoprotein metabolism and other lipidemia: Secondary | ICD-10-CM

## 2019-11-14 DIAGNOSIS — E538 Deficiency of other specified B group vitamins: Secondary | ICD-10-CM | POA: Diagnosis present

## 2019-11-14 DIAGNOSIS — M1990 Unspecified osteoarthritis, unspecified site: Secondary | ICD-10-CM | POA: Diagnosis present

## 2019-11-14 DIAGNOSIS — K8050 Calculus of bile duct without cholangitis or cholecystitis without obstruction: Secondary | ICD-10-CM | POA: Diagnosis present

## 2019-11-14 DIAGNOSIS — R748 Abnormal levels of other serum enzymes: Secondary | ICD-10-CM | POA: Diagnosis present

## 2019-11-14 DIAGNOSIS — E785 Hyperlipidemia, unspecified: Secondary | ICD-10-CM | POA: Diagnosis present

## 2019-11-14 DIAGNOSIS — Z821 Family history of blindness and visual loss: Secondary | ICD-10-CM

## 2019-11-14 DIAGNOSIS — R7989 Other specified abnormal findings of blood chemistry: Secondary | ICD-10-CM | POA: Diagnosis not present

## 2019-11-14 LAB — BASIC METABOLIC PANEL
Anion gap: 12 (ref 5–15)
BUN: 5 mg/dL — ABNORMAL LOW (ref 8–23)
CO2: 26 mmol/L (ref 22–32)
Calcium: 9 mg/dL (ref 8.9–10.3)
Chloride: 98 mmol/L (ref 98–111)
Creatinine, Ser: 0.78 mg/dL (ref 0.44–1.00)
GFR calc Af Amer: >60 mL/min (ref >60)
GFR calc non Af Amer: >60 mL/min (ref >60)
Glucose, Bld: 138 mg/dL — ABNORMAL HIGH (ref 70–99)
Potassium: 3.7 mmol/L (ref 3.5–5.1)
Sodium: 136 mmol/L (ref 135–145)

## 2019-11-14 LAB — CBC WITH DIFFERENTIAL/PLATELET
Abs Immature Granulocytes: 0.05 10*3/uL (ref 0.00–0.07)
Basophils Absolute: 0 10*3/uL (ref 0.0–0.1)
Basophils Relative: 0 %
Eosinophils Absolute: 0.1 10*3/uL (ref 0.0–0.5)
Eosinophils Relative: 1 %
HCT: 34 % — ABNORMAL LOW (ref 36.0–46.0)
Hemoglobin: 11 g/dL — ABNORMAL LOW (ref 12.0–15.0)
Immature Granulocytes: 1 %
Lymphocytes Relative: 22 %
Lymphs Abs: 1.4 10*3/uL (ref 0.7–4.0)
MCH: 23.1 pg — ABNORMAL LOW (ref 26.0–34.0)
MCHC: 32.4 g/dL (ref 30.0–36.0)
MCV: 71.3 fL — ABNORMAL LOW (ref 80.0–100.0)
Monocytes Absolute: 0.4 10*3/uL (ref 0.1–1.0)
Monocytes Relative: 6 %
Neutro Abs: 4.5 10*3/uL (ref 1.7–7.7)
Neutrophils Relative %: 70 %
Platelets: 500 10*3/uL — ABNORMAL HIGH (ref 150–400)
RBC: 4.77 MIL/uL (ref 3.87–5.11)
RDW: 16.7 % — ABNORMAL HIGH (ref 11.5–15.5)
WBC: 6.5 10*3/uL (ref 4.0–10.5)
nRBC: 0 % (ref 0.0–0.2)

## 2019-11-14 LAB — COMPREHENSIVE METABOLIC PANEL
ALT: 297 U/L — ABNORMAL HIGH (ref 0–44)
AST: 235 U/L — ABNORMAL HIGH (ref 15–41)
Albumin: 3.5 g/dL (ref 3.5–5.0)
Alkaline Phosphatase: 627 U/L — ABNORMAL HIGH (ref 38–126)
Anion gap: 13 (ref 5–15)
BUN: 6 mg/dL — ABNORMAL LOW (ref 8–23)
CO2: 27 mmol/L (ref 22–32)
Calcium: 9.3 mg/dL (ref 8.9–10.3)
Chloride: 96 mmol/L — ABNORMAL LOW (ref 98–111)
Creatinine, Ser: 0.83 mg/dL (ref 0.44–1.00)
GFR calc Af Amer: >60 mL/min (ref >60)
GFR calc non Af Amer: >60 mL/min (ref >60)
Glucose, Bld: 164 mg/dL — ABNORMAL HIGH (ref 70–99)
Potassium: 2.6 mmol/L — CL (ref 3.5–5.1)
Sodium: 136 mmol/L (ref 135–145)
Total Bilirubin: 5.5 mg/dL — ABNORMAL HIGH (ref 0.3–1.2)
Total Protein: 7.9 g/dL (ref 6.5–8.1)

## 2019-11-14 LAB — FOLATE: Folate: 15.2 ng/mL (ref >5.9)

## 2019-11-14 LAB — MAGNESIUM: Magnesium: 2 mg/dL (ref 1.7–2.4)

## 2019-11-14 LAB — LIPASE, BLOOD: Lipase: 22 U/L (ref 11–51)

## 2019-11-14 LAB — SARS CORONAVIRUS 2 BY RT PCR (HOSPITAL ORDER, PERFORMED IN ~~LOC~~ HOSPITAL LAB): SARS Coronavirus 2: NEGATIVE

## 2019-11-14 LAB — FERRITIN: Ferritin: 205 ng/mL (ref 11–307)

## 2019-11-14 LAB — VITAMIN B12: Vitamin B-12: 133 pg/mL — ABNORMAL LOW (ref 180–914)

## 2019-11-14 MED ORDER — LISINOPRIL 20 MG PO TABS
20.0000 mg | ORAL_TABLET | Freq: Every day | ORAL | Status: DC
  Administered 2019-11-14 – 2019-11-17 (×4): 20 mg via ORAL
  Filled 2019-11-14: qty 1
  Filled 2019-11-14: qty 2
  Filled 2019-11-14 (×2): qty 1

## 2019-11-14 MED ORDER — PENTAFLUOROPROP-TETRAFLUOROETH EX AERO
INHALATION_SPRAY | CUTANEOUS | Status: DC | PRN
  Filled 2019-11-14 (×2): qty 30

## 2019-11-14 MED ORDER — ONDANSETRON HCL 4 MG/2ML IJ SOLN
4.0000 mg | Freq: Four times a day (QID) | INTRAMUSCULAR | Status: DC | PRN
  Administered 2019-11-16: 4 mg via INTRAVENOUS
  Filled 2019-11-14: qty 2

## 2019-11-14 MED ORDER — LISINOPRIL-HYDROCHLOROTHIAZIDE 20-25 MG PO TABS: 1.0000 | ORAL_TABLET | Freq: Every day | ORAL | Status: DC

## 2019-11-14 MED ORDER — ACETAMINOPHEN 650 MG RE SUPP: 650.0000 mg | Freq: Four times a day (QID) | RECTAL | Status: DC | PRN

## 2019-11-14 MED ORDER — GADOBUTROL 1 MMOL/ML IV SOLN
9.0000 mL | Freq: Once | INTRAVENOUS | Status: AC | PRN
  Administered 2019-11-14: 9 mL via INTRAVENOUS

## 2019-11-14 MED ORDER — ENOXAPARIN SODIUM 40 MG/0.4ML ~~LOC~~ SOLN
40.0000 mg | SUBCUTANEOUS | Status: DC
  Administered 2019-11-15 – 2019-11-17 (×3): 40 mg via SUBCUTANEOUS
  Filled 2019-11-14 (×3): qty 0.4

## 2019-11-14 MED ORDER — POLYETHYLENE GLYCOL 3350 17 G PO PACK: 17.0000 g | PACK | Freq: Every day | ORAL | Status: DC | PRN

## 2019-11-14 MED ORDER — POTASSIUM CHLORIDE CRYS ER 20 MEQ PO TBCR
40.0000 meq | EXTENDED_RELEASE_TABLET | Freq: Once | ORAL | Status: AC
  Administered 2019-11-14: 40 meq via ORAL
  Filled 2019-11-14: qty 2

## 2019-11-14 MED ORDER — ATORVASTATIN CALCIUM 20 MG PO TABS
20.0000 mg | ORAL_TABLET | Freq: Every day | ORAL | Status: DC
  Administered 2019-11-15 – 2019-11-17 (×3): 20 mg via ORAL
  Filled 2019-11-14 (×3): qty 1

## 2019-11-14 MED ORDER — HYDROCHLOROTHIAZIDE 25 MG PO TABS
25.0000 mg | ORAL_TABLET | Freq: Every day | ORAL | Status: DC
  Administered 2019-11-14 – 2019-11-17 (×4): 25 mg via ORAL
  Filled 2019-11-14 (×4): qty 1

## 2019-11-14 MED ORDER — ONDANSETRON HCL 4 MG PO TABS: 4.0000 mg | ORAL_TABLET | Freq: Four times a day (QID) | ORAL | Status: DC | PRN

## 2019-11-14 MED ORDER — POTASSIUM CHLORIDE 10 MEQ/100ML IV SOLN
10.0000 meq | Freq: Once | INTRAVENOUS | Status: AC
  Administered 2019-11-14: 10 meq via INTRAVENOUS
  Filled 2019-11-14: qty 100

## 2019-11-14 MED ORDER — ACETAMINOPHEN 325 MG PO TABS: 650.0000 mg | ORAL_TABLET | Freq: Four times a day (QID) | ORAL | Status: DC | PRN

## 2019-11-14 MED ORDER — METOPROLOL SUCCINATE ER 50 MG PO TB24
50.0000 mg | ORAL_TABLET | Freq: Every day | ORAL | Status: DC
  Administered 2019-11-14 – 2019-11-17 (×4): 50 mg via ORAL
  Filled 2019-11-14 (×4): qty 1

## 2019-11-14 MED ORDER — POTASSIUM CHLORIDE 20 MEQ PO PACK
20.0000 meq | PACK | Freq: Every day | ORAL | Status: DC
  Administered 2019-11-14 – 2019-11-17 (×4): 20 meq via ORAL
  Filled 2019-11-14 (×4): qty 1

## 2019-11-14 NOTE — Consult Note (Signed)
Jonathon Bellows , MD [address], [place], [place], [place], [address] [address], [place], [place], [place], [address] Phone: [phone]  Fax: [phone]  Consultation  Referring Provider:     Dr Tamala Julian Primary Care Physician:  [place] Primary Gastroenterologist:  None          Reason for Consultation:     Choledocholithiasis  Date of Admission:  11/14/2019 Date of Consultation:  11/14/2019         HPI:   Robin Daniels is a 64 y.o. female presented to the emergency room with abnormal LFTs.  Recently had labs checked at local [facility] for dark urine noticed over the weekend and was told to go to the emergency room.   In the ER underwent ultrasound right upper quadrant that demonstrated a common bile duct diameter of 13 mm.  Multiple gallstones up to 7 mm in diameter.  No evidence of acute cholecystitis.  Concern for possible common duct stone versus obstructing mass lesion.  On admission potassium 2.6, total bilirubin of 5.5 alkaline phosphatase of 627, AST of 235, ALT of 297.  Hemoglobin 11.4 g with an MCV of 71.3 and a platelet count of 500.  She denies any abdominal pain.  Lost about 8 to 10 pounds over the last few weeks.  Recently been having pale-colored stools and dark-colored urine.  Not a smoker.  No family history of pancreatic cancer.  Presently has no complaints.  Denies any overt blood loss in terms of vaginal bleeding, nasal bleeds, blood in urine.  Past Medical History:  Diagnosis Date  . Anemia    history of  . Anxiety 09/11/2014  . Arthritis   . Diabetes mellitus without complication ([place])   . Fluttering heart   . Hyperlipidemia   . Hypertension     Past Surgical History:  Procedure Laterality Date  . right knee replacement Right [id number]  . SHOULDER ARTHROSCOPY W/ ROTATOR CUFF REPAIR Right     Prior to Admission medications   Medication Sig Start Date End Date Taking? Authorizing Provider   lisinopril-hydrochlorothiazide (ZESTORETIC) 20-25 MG tablet Take 1 tablet by mouth daily. 08/14/19  Yes [provider]  melatonin 1 MG TABS tablet MELATONIN 1 MG TABS 11/19/15  Yes [provider]  Potassium Chloride ER 20 MEQ TBCR Take 1 tablet by mouth daily. 10/29/19  Yes [provider]    Family History  Problem Relation Age of Onset  . Diabetes Mother   . Hyperlipidemia Mother   . Hypertension Mother   . Vision loss Mother   . Arthritis Father   . Hyperlipidemia Father   . Hypertension Father   . Breast cancer Maternal Aunt      Social History   Tobacco Use  . Smoking status: Never Smoker  . Smokeless tobacco: Never Used  Substance Use Topics  . Alcohol use: No    Alcohol/week: 0.0 standard drinks  . Drug use: No    Allergies as of 11/14/2019  . (No Known Allergies)    Review of Systems:    All systems reviewed and negative except where noted in HPI.   Physical Exam:  Vital signs in last 24 hours: Temp:  [98.2 F (36.8 C)-98.5 F (36.9 C)] 98.2 F (36.8 C) (08/18 1148) Pulse Rate:  [86-93] 86 (08/18 1148) Resp:  [16] 16 (08/18 1148) BP: (161-170)/(86-95) 161/86 (08/18 1148) SpO2:  [96 %-99 %] 96 % (08/18 1148) Weight:  [96.2 kg] 96.2 kg (  08/18 0916)   General:   Pleasant, cooperative in NAD Head:  Normocephalic and atraumatic. Eyes:   No icterus.   Conjunctiva pink. PERRLA. Ears:  Normal auditory acuity. Neck:  Supple; no masses or thyroidomegaly Lungs: Respirations even and unlabored. Lungs clear to auscultation bilaterally.   No wheezes, crackles, or rhonchi.  Heart:  Regular rate and rhythm;  Without murmur, clicks, rubs or gallops Abdomen:  Soft, nondistended, nontender. Normal bowel sounds. No appreciable masses or hepatomegaly.  No rebound or guarding.  Neurologic:  Alert and oriented x3;  grossly normal neurologically. Skin:  Intact without significant lesions or rashes. Cervical Nodes:  No significant cervical  adenopathy. Psych:  Alert and cooperative. Normal affect.  LAB RESULTS: Recent Labs    11/14/19 0918  WBC 6.5  HGB 11.0*  HCT 34.0*  PLT 500*   BMET Recent Labs    11/14/19 0918  NA 136  K 2.6*  CL 96*  CO2 27  GLUCOSE 164*  BUN 6*  CREATININE 0.83  CALCIUM 9.3   LFT Recent Labs    11/14/19 0918  PROT 7.9  ALBUMIN 3.5  AST 235*  ALT 297*  ALKPHOS 627*  BILITOT 5.5*   PT/INR No results for input(s): LABPROT, INR in the last 72 hours.  STUDIES: US Abdomen Limited RUQ  Result Date: 11/14/2019 CLINICAL DATA:  Elevated LFT EXAM: ULTRASOUND ABDOMEN LIMITED RIGHT UPPER QUADRANT COMPARISON:  CT abdomen pelvis 08/02/2018 FINDINGS: Gallbladder: Multiple gallstones measuring up to 7 mm in diameter. Negative sonographic Murphy sign. No gallbladder wall thickening or pericholecystic fluid. Common bile duct: Diameter: 13 mm.  Common bile duct is significantly dilated. Liver: No focal lesion identified. Within normal limits in parenchymal echogenicity. Portal vein is patent on color Doppler imaging with normal direction of blood flow towards the liver. Other: Negative for ascites IMPRESSION: Cholelithiasis without evidence of acute cholecystitis Common bile duct dilated at 13 mm. Possible common duct stone versus obstructing mass lesion. Electronically Signed   By: Franchot Gallo M.D.   On: 11/14/2019 13:39      Impression / Plan:   Robin Daniels is a 64 y.o. y/o female presented the emergency room with abnormal liver function test.  Recently noted dark urine.  Found to have an elevated total bilirubin of 5.5.  Common bile duct diameter of 13 mm right upper quadrant ultrasound.  Cholelithiasis noted concern by radiologist for mass versus choledocholithiasis.  Incidentally I also note microcytic anemia.  Plan 1.  Obtain MRCP to rule out choledocholithiasis versus pancreatic mass.  If obstructive mass versus stone seen will then require ERCP. 2.  Obtain iron studies, B12, folate in  view of microcytic anemia.   I have discussed the plan with Dr. Tamala Julian in the ER  Thank you for involving me in the care of this patient.      LOS: 0 days   Jonathon Bellows, MD  11/14/2019, 2:49 PM

## 2019-11-14 NOTE — H&P (Addendum)
History and Physical:    EARLENE Daniels   NLG:[id number] DOB: May 27, 1955 DOA: 11/14/2019  Referring MD/provider: Vladimir Crofts, MD PCP: [place]   Patient coming from: Home  Chief Complaint: Abnormal labs  History of Present Illness:   Robin Daniels is a 64 y.o. female with medical history significant for hypertension, anemia, anxiety, arthritis, type 2 diabetes mellitus, obesity, presented to the emergency room today at the behest of her primary care physician.  She said she went to her PCP yesterday for lab work.  She was called today to come to the hospital because of abnormal labs.  Labs were significant for elevated liver enzymes with AST 235, ALT 297, total bilirubin 5.5 and ALP 627.  Potassium was 2.6.  She said she noticed dark urine 2 days ago.  She has been having nausea for about a week.  Her appetite has been poor.  She said she has been under a lot of stress and she thinks that is contributing to the poor appetite.  She has also lost some weight.  She was 210 pounds, maybe a few months ago but yesterday her weight was 203 pounds.  No vomiting, abdominal pain, diarrhea, shortness of breath, chest pain, headache, dizziness, palpitations.  ED Course: Blood pressure was elevated otherwise vital signs are within normal limits.  The patient was given oral and IV potassium chloride for hypokalemia.  ROS:   ROS all other systems reviewed were negative  Past Medical History:   Past Medical History:  Diagnosis Date  . Anemia    history of  . Anxiety 09/11/2014  . Arthritis   . Diabetes mellitus without complication ([place])   . Fluttering heart   . Hyperlipidemia   . Hypertension     Past Surgical History:   Past Surgical History:  Procedure Laterality Date  . right knee replacement Right [id number]  . SHOULDER ARTHROSCOPY W/ ROTATOR CUFF REPAIR Right     Social History:   Social History   Socioeconomic History  . Marital status: Divorced     Spouse name: Not on file  . Number of children: Not on file  . Years of education: Not on file  . Highest education level: Not on file  Occupational History  . Not on file  Tobacco Use  . Smoking status: Never Smoker  . Smokeless tobacco: Never Used  Substance and Sexual Activity  . Alcohol use: No    Alcohol/week: 0.0 standard drinks  . Drug use: No  . Sexual activity: Never  Other Topics Concern  . Not on file  Social History Narrative  . Not on file   Social Determinants of Health   Financial Resource Strain:   . Difficulty of Paying Living Expenses:   Food Insecurity:   . Worried About Charity fundraiser in the Last Year:   . Arboriculturist in the Last Year:   Transportation Needs:   . Film/video editor (Medical):   Marland Kitchen Lack of Transportation (Non-Medical):   Physical Activity:   . Days of Exercise per Week:   . Minutes of Exercise per Session:   Stress:   . Feeling of Stress :   Social Connections:   . Frequency of Communication with Friends and Family:   . Frequency of Social Gatherings with Friends and Family:   . Attends Religious Services:   . Active Member of Clubs or Organizations:   . Attends Archivist Meetings:   Marland Kitchen Marital  Status:   Intimate Partner Violence:   . Fear of Current or Ex-Partner:   . Emotionally Abused:   Marland Kitchen Physically Abused:   . Sexually Abused:     Allergies   Patient has no known allergies.  Family history:   Family History  Problem Relation Age of Onset  . Diabetes Mother   . Hyperlipidemia Mother   . Hypertension Mother   . Vision loss Mother   . Arthritis Father   . Hyperlipidemia Father   . Hypertension Father   . Breast cancer Maternal Aunt     Current Medications:   Prior to Admission medications   Medication Sig Start Date End Date Taking? Authorizing Provider  lisinopril-hydrochlorothiazide (ZESTORETIC) 20-25 MG tablet Take 1 tablet by mouth daily. 08/14/19  Yes [provider]   melatonin 1 MG TABS tablet MELATONIN 1 MG TABS 11/19/15  Yes [provider]  Potassium Chloride ER 20 MEQ TBCR Take 1 tablet by mouth daily. 10/29/19  Yes [provider]    Physical Exam:   Vitals:   11/14/19 0915 11/14/19 0916 11/14/19 1148  BP: (!) 170/95  (!) 161/86  Pulse: 93  86  Resp: 16  16  Temp: 98.5 F (36.9 C)  98.2 F (36.8 C)  TempSrc: Oral  Oral  SpO2: 99%  96%  Weight:  96.2 kg   Height:  5\' 4"  (1.626 m)      Physical Exam: Blood pressure (!) 161/86, pulse 86, temperature 98.2 F (36.8 C), temperature source Oral, resp. rate 16, height 5\' 4"  (1.626 m), weight 96.2 kg, SpO2 96 %. Gen: No acute distress. Head: Normocephalic, atraumatic. Eyes: Pupils equal, round and reactive to light. Extraocular movements intact.  Sclerae icteric.  Mouth: Moist mucous membranes. Neck: Supple, no thyromegaly, no lymphadenopathy, no jugular venous distention. Chest: Lungs are clear to auscultation with good air movement. No rales, rhonchi or wheezes.  CV: Heart sounds are regular with an S1, S2. No murmurs, rubs or gallops.  Abdomen: Soft, nontender, obese with normal active bowel sounds. No palpable masses. Extremities: Extremities are without clubbing, or cyanosis. No edema. Pedal pulses 2+.  Skin: Warm and dry. No rashes, lesions or wounds Neuro: Alert and oriented times 3; grossly nonfocal.  Psych: Insight is good and judgment is appropriate. Mood and affect normal.   Data Review:    Labs: Basic Metabolic Panel: Recent Labs  Lab 11/14/19 0918  NA 136  K 2.6*  CL 96*  CO2 27  GLUCOSE 164*  BUN 6*  CREATININE 0.83  CALCIUM 9.3   Liver Function Tests: Recent Labs  Lab 11/14/19 0918  AST 235*  ALT 297*  ALKPHOS 627*  BILITOT 5.5*  PROT 7.9  ALBUMIN 3.5   Recent Labs  Lab 11/14/19 0928  LIPASE 22   No results for input(s): AMMONIA in the last 168 hours. CBC: Recent Labs  Lab 11/14/19 0918  WBC 6.5  NEUTROABS 4.5  HGB 11.0*   HCT 34.0*  MCV 71.3*  PLT 500*   Cardiac Enzymes: No results for input(s): CKTOTAL, CKMB, CKMBINDEX, TROPONINI in the last 168 hours.  BNP (last 3 results) No results for input(s): PROBNP in the last 8760 hours. CBG: No results for input(s): GLUCAP in the last 168 hours.  Urinalysis    Component Value Date/Time   COLORURINE YELLOW (A) 08/02/2018 1009   COLORURINE YELLOW (A) 08/02/2018 1009   APPEARANCEUR HAZY (A) 08/02/2018 1009   APPEARANCEUR HAZY (A) 08/02/2018 1009   LABSPEC 1.014 08/02/2018  1009   LABSPEC 1.015 08/02/2018 1009   PHURINE 6.0 08/02/2018 1009   PHURINE 6.0 08/02/2018 1009   GLUCOSEU NEGATIVE 08/02/2018 1009   [place] 08/02/2018 1009   HGBUR SMALL (A) 08/02/2018 1009   HGBUR SMALL (A) 08/02/2018 1009   BILIRUBINUR NEGATIVE 08/02/2018 1009   [place] 08/02/2018 1009   [place] 08/02/2018 1009   [place] 08/02/2018 1009   PROTEINUR NEGATIVE 08/02/2018 1009   PROTEINUR NEGATIVE 08/02/2018 1009   NITRITE NEGATIVE 08/02/2018 1009   NITRITE NEGATIVE 08/02/2018 1009   LEUKOCYTESUR NEGATIVE 08/02/2018 1009   [place] 08/02/2018 1009      Radiographic Studies: US Abdomen Limited RUQ  Result Date: 11/14/2019 CLINICAL DATA:  Elevated LFT EXAM: ULTRASOUND ABDOMEN LIMITED RIGHT UPPER QUADRANT COMPARISON:  CT abdomen pelvis 08/02/2018 FINDINGS: Gallbladder: Multiple gallstones measuring up to 7 mm in diameter. Negative sonographic Murphy sign. No gallbladder wall thickening or pericholecystic fluid. Common bile duct: Diameter: 13 mm.  Common bile duct is significantly dilated. Liver: No focal lesion identified. Within normal limits in parenchymal echogenicity. Portal vein is patent on color Doppler imaging with normal direction of blood flow towards the liver. Other: Negative for ascites IMPRESSION: Cholelithiasis without evidence of acute cholecystitis Common bile duct dilated at 13 mm. Possible common duct stone  versus obstructing mass lesion. Electronically Signed   By: Franchot Gallo M.D.   On: 11/14/2019 13:39      Assessment/Plan:   Principal Problem:   Choledocholithiasis Active Problems:   Hypokalemia   Gallstones   Elevated liver enzymes    Cholelithiasis Elevated liver enzymes Possible common bile duct stone versus obstructing mass lesion in the common bile duct Hypokalemia Hypertension, uncontrolled  Body mass index is 36.4 kg/m.  (Morbid obesity)   PLAN  Admit to MedSurg MRCP with contrast has been ordered for further evaluation Antiemetics as needed for nausea/vomiting Replete potassium and monitor levels.  Check magnesium level. Monitor liver enzymes Patient has been seen by gastroenterologist, Dr. Vicente Males    Other information:   DVT prophylaxis: enoxaparin (LOVENOX) injection 40 mg Start: 11/15/19 1000 SCDs Start: 11/14/19 [id number]  Code Status: Full code. Family Communication: Plan discussed with patient Disposition Plan: Possible discharge to home in 2 to 3 days Consults called: Copywriter, advertising, Dr. Vicente Males Admission status: Inpatient  The medical decision making on this patient was of high complexity and the patient is at high risk for clinical deterioration, therefore this is a level 3 visit.    Time spent 55 minutes  [facility] Pager: Please check [email or web address]   How to contact the [facility] Attending or Consulting provider [place] or covering provider during after hours [place], for this patient?   1. Check the care team in [facility] and look for a) attending/consulting [facility] provider listed and b) the [facility] team listed 2. Log into [email or web address] and use [place]'s universal password to access. If you do not have the password, please contact the hospital operator. 3. Locate the [facility] provider you are looking for under [facility] and page to a number that you can be directly reached. 4. If you still have difficulty reaching the  provider, please page the [facility] (Director on Call) for the Hospitalists listed on amion for assistance.  11/14/2019, 4:15 PM

## 2019-11-14 NOTE — ED Provider Notes (Signed)
[facility] Emergency Department Provider Note ____________________________________________   First MD Initiated Contact with Patient 11/14/19 1347     (approximate)  I have reviewed the triage vital signs and the nursing notes.  HISTORY  Chief Complaint No chief complaint on file.   HPI Robin Daniels is a 64 y.o. femalewho presents to the ED for evaluation of abnormal outpatient labs.  Chart review indicates history of hypertension.   Patient reports noting dark-colored urine over this past weekend, so she went to the local [facility] and had blood work/urine testing performed.  She reports getting a call that her "numbers" were very high, and that she was told to go to the ED.  She reports that her dark urine has improved, and she denies any dysuria, hematuria.  She reports that she does have some "twinges" to her upper abdomen, referring to 1-2/10 intensity pain that is intermittent and postprandial.  She denies any significant nausea, vomiting, fevers, syncope, chest pain, shortness of breath or cough.  Past Medical History:  Diagnosis Date  . Anemia    history of  . Anxiety 09/11/2014  . Arthritis   . Diabetes mellitus without complication ([facility])   . Fluttering heart   . Hyperlipidemia   . Hypertension     Patient Active Problem List   Diagnosis Date Noted  . Choledocholithiasis 11/14/2019  . Obesity 12/07/2015  . Heart palpitations 02/18/2015  . Hypertension goal BP (blood pressure) < 140/90 10/17/2014  . Pure hypercholesterolemia 10/17/2014  . H/O total knee replacement 10/17/2014  . Encounter for general adult medical examination without abnormal findings 09/11/2014  . Anxiety 09/11/2014  . HLD (hyperlipidemia) 09/11/2014  . Migraine without aura and responsive to treatment 09/11/2014  . Nocturnal cough 09/11/2014  . Anemia, iron deficiency 09/11/2014  . Plantar fasciitis 09/11/2014  . IFG (impaired fasting glucose)  09/11/2014  . Encounter for screening for malignant neoplasm of colon 09/11/2014  . Avitaminosis D 09/11/2014  . 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 right knee replacement Right [id number]  . SHOULDER ARTHROSCOPY W/ ROTATOR CUFF REPAIR Right     Prior to Admission medications   Medication Sig Start Date End Date Taking? Authorizing Provider  atorvastatin (LIPITOR) 20 MG tablet Take 20 mg by mouth daily.   Yes [provider]  lisinopril-hydrochlorothiazide (ZESTORETIC) 20-25 MG tablet Take 1 tablet by mouth daily. 08/14/19  Yes [provider]  metoprolol succinate (TOPROL-XL) 50 MG 24 hr tablet Take 50 mg by mouth daily. Take with or immediately following a meal.   Yes [provider]  Potassium Chloride ER 20 MEQ TBCR Take 20 mEq by mouth daily.    Yes [provider]    Allergies Patient has no known allergies.  Family History  Problem Relation Age of Onset  . Diabetes Mother   . Hyperlipidemia Mother   . Hypertension Mother   . Vision loss Mother   . Arthritis Father   . Hyperlipidemia Father   . Hypertension Father   . Breast cancer Maternal Aunt     Social History Social History   Tobacco Use  . Smoking status: Never Smoker  . Smokeless tobacco: Never Used  Substance Use Topics  . Alcohol use: No    Alcohol/week: 0.0 standard drinks  . Drug use: No    Review of Systems  Constitutional: No fever/chills Eyes: No visual changes. ENT: No sore throat. Cardiovascular: Denies  chest pain. Respiratory: Denies shortness of breath. Gastrointestinal: .  No nausea, no vomiting.  No diarrhea.  No constipation.  Positive for abdominal pain. Genitourinary: Negative for dysuria. Musculoskeletal: Negative for back pain. Skin: Negative for rash. Neurological: Negative for headaches, focal weakness or numbness.   ____________________________________________   PHYSICAL  EXAM:  VITAL SIGNS: Vitals:   11/14/19 0915 11/14/19 1148  BP: (!) 170/95 (!) 161/86  Pulse: 93 86  Resp: 16 16  Temp: 98.5 F (36.9 C) 98.2 F (36.8 C)  SpO2: 99% 96%      Constitutional: Alert and oriented. Well appearing and in no acute distress. Eyes: PERRL. EOMI. mild scleral icterus is present. Head: Atraumatic. Nose: No congestion/rhinnorhea. Mouth/Throat: Mucous membranes are moist.  Oropharynx non-erythematous. Neck: No stridor. No cervical spine tenderness to palpation. Cardiovascular: Normal rate, regular rhythm. Grossly normal heart sounds.  Good peripheral circulation. Respiratory: Normal respiratory effort.  No retractions. Lungs CTAB. Gastrointestinal: Soft , nondistended. No abdominal bruits. No CVA tenderness. Mild epigastric and RUQ tenderness to palpation without peritoneal features.  Abdomen is otherwise benign. Musculoskeletal: No lower extremity tenderness nor edema.  No joint effusions. No signs of acute trauma. Neurologic:  Normal speech and language. No gross focal neurologic deficits are appreciated. No gait instability noted. Skin:  Skin is warm, dry and intact. No rash noted. Psychiatric: Mood and affect are normal. Speech and behavior are normal.  ____________________________________________   LABS (all labs ordered are listed, but only abnormal results are displayed)  Labs Reviewed  CBC WITH DIFFERENTIAL/PLATELET - Abnormal; Notable for the following components:      Result Value   Hemoglobin 11.0 (*)    HCT 34.0 (*)    MCV 71.3 (*)    MCH 23.1 (*)    RDW 16.7 (*)    Platelets 500 (*)    All other components within normal limits  COMPREHENSIVE METABOLIC PANEL - Abnormal; Notable for the following components:   Potassium 2.6 (*)    Chloride 96 (*)    Glucose, Bld 164 (*)    BUN 6 (*)    AST 235 (*)    ALT 297 (*)    Alkaline Phosphatase 627 (*)    Total Bilirubin 5.5 (*)    All other components within normal limits  SARS CORONAVIRUS  2 BY RT PCR (HOSPITAL ORDER, [facility])  LIPASE, BLOOD  FERRITIN  VITAMIN B12  FOLATE   ____________________________________________  RADIOLOGY  ED MD interpretation:    Official radiology report(s): US Abdomen Limited RUQ  Result Date: 11/14/2019 CLINICAL DATA:  Elevated LFT EXAM: ULTRASOUND ABDOMEN LIMITED RIGHT UPPER QUADRANT COMPARISON:  CT abdomen pelvis 08/02/2018 FINDINGS: Gallbladder: Multiple gallstones measuring up to 7 mm in diameter. Negative sonographic Murphy sign. No gallbladder wall thickening or pericholecystic fluid. Common bile duct: Diameter: 13 mm.  Common bile duct is significantly dilated. Liver: No focal lesion identified. Within normal limits in parenchymal echogenicity. Portal vein is patent on color Doppler imaging with normal direction of blood flow towards the liver. Other: Negative for ascites IMPRESSION: Cholelithiasis without evidence of acute cholecystitis Common bile duct dilated at 13 mm. Possible common duct stone versus obstructing mass lesion. Electronically Signed   By: Franchot Gallo M.D.   On: 11/14/2019 13:39    ____________________________________________   PROCEDURES and INTERVENTIONS  Procedure(s) performed (including Critical Care):  Procedures  Medications  potassium chloride 10 mEq in 100 mL IVPB (10 mEq Intravenous New Bag/Given 11/14/19 1540)  pentafluoroprop-tetrafluoroeth (GEBAUERS) aerosol (  has no administration in time range)  potassium chloride SA (KLOR-CON) CR tablet 40 mEq (40 mEq Oral Given 11/14/19 1537)    ____________________________________________   INITIAL IMPRESSION / ASSESSMENT AND PLAN / ED COURSE  Healthy 64 year old woman presenting with painless jaundice, with evidence of choledocholithiasis versus biliary mass, requiring medical admission.  Normal vital signs on room air.  Examination with mild scleral icterus and a benign abdominal exam with only mild RUQ tenderness without  peritoneal features.  Blood work redemonstrates presenting concerns with evidence of biliary obstruction, as above.  Also noted hypokalemia.  Started the patient on potassium repletion orally and IV.  Consulted GI after RUQ ultrasound shows choledocholithiasis versus biliary mass as the source of obstruction.  Per GI, we will pursue MRCP to further evaluate this.  We will admit the patient to hospitalist medicine for further work-up and management of her biliary obstruction.  Clinical Course as of Nov 14 1538  Wed Nov 14, 2019  1417 Paged GI   [DS]  1419 Immediate call back from dr. Vicente Males, who agrees to immediately assess the patient   [DS]  1516 Dr. Vicente Males has evaluated the patient and recommends MRCP for evaluation of mass versus stone.  I spoke with hospitalist for admission.   [DS]    Clinical Course User Index [DS] Vladimir Crofts, MD     ____________________________________________   FINAL CLINICAL IMPRESSION(S) / ED DIAGNOSES  Final diagnoses:  Calculus of gallbladder with biliary obstruction but without cholecystitis  Choledocholithiasis  Hypokalemia     ED Discharge Orders    None       Davari Lopes   Note:  This document was prepared using Dragon voice recognition software and may include unintentional dictation errors.   Vladimir Crofts, MD 11/14/19 [phone]

## 2019-11-14 NOTE — ED Notes (Signed)
Rainbow sent to the lab.  

## 2019-11-14 NOTE — ED Triage Notes (Signed)
Had blood work drawn yesterday and was referred to ED for high levels.  Blood work drawn through [employer].

## 2019-11-15 ENCOUNTER — Other Ambulatory Visit: Payer: Self-pay

## 2019-11-15 DIAGNOSIS — K8689 Other specified diseases of pancreas: Secondary | ICD-10-CM | POA: Diagnosis present

## 2019-11-15 DIAGNOSIS — K831 Obstruction of bile duct: Secondary | ICD-10-CM | POA: Diagnosis present

## 2019-11-15 LAB — COMPREHENSIVE METABOLIC PANEL
ALT: 279 U/L — ABNORMAL HIGH (ref 0–44)
AST: 242 U/L — ABNORMAL HIGH (ref 15–41)
Albumin: 3.2 g/dL — ABNORMAL LOW (ref 3.5–5.0)
Alkaline Phosphatase: 654 U/L — ABNORMAL HIGH (ref 38–126)
Anion gap: 11 (ref 5–15)
BUN: <5 mg/dL — ABNORMAL LOW (ref 8–23)
CO2: 29 mmol/L (ref 22–32)
Calcium: 9.4 mg/dL (ref 8.9–10.3)
Chloride: 96 mmol/L — ABNORMAL LOW (ref 98–111)
Creatinine, Ser: 0.64 mg/dL (ref 0.44–1.00)
GFR calc Af Amer: >60 mL/min (ref >60)
GFR calc non Af Amer: >60 mL/min (ref >60)
Glucose, Bld: 133 mg/dL — ABNORMAL HIGH (ref 70–99)
Potassium: 3.5 mmol/L (ref 3.5–5.1)
Sodium: 136 mmol/L (ref 135–145)
Total Bilirubin: 6.1 mg/dL — ABNORMAL HIGH (ref 0.3–1.2)
Total Protein: 7.5 g/dL (ref 6.5–8.1)

## 2019-11-15 LAB — PROTIME-INR
INR: 1 (ref 0.8–1.2)
Prothrombin Time: 12.5 seconds (ref 11.4–15.2)

## 2019-11-15 LAB — GLUCOSE, CAPILLARY: Glucose-Capillary: 131 mg/dL — ABNORMAL HIGH (ref 70–99)

## 2019-11-15 LAB — HIV ANTIBODY (ROUTINE TESTING W REFLEX): HIV Screen 4th Generation wRfx: NONREACTIVE

## 2019-11-15 MED ORDER — CYANOCOBALAMIN 1000 MCG/ML IJ SOLN
1000.0000 ug | Freq: Every day | INTRAMUSCULAR | Status: DC
  Administered 2019-11-15 – 2019-11-17 (×3): 1000 ug via INTRAMUSCULAR
  Filled 2019-11-15 (×3): qty 1

## 2019-11-15 NOTE — Progress Notes (Addendum)
Progress Note    Robin Daniels  JAS:[id number] DOB: 06-Dec-1955  DOA: 11/14/2019 PCP: [facility]      Brief Narrative:    Medical records reviewed and are as summarized below:  Robin Daniels is a 64 y.o. 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  Keep n.p.o. after midnight. Start vitamin B12 injection Continue to replete potassium Continue antihypertensives    Body mass index is 36.4 kg/m.  Diet Order            Diet NPO time specified Except for: Sips with Meds  Diet effective midnight           Diet Heart Room service appropriate? Yes; Fluid consistency: Thin  Diet effective now                       Medications:   . atorvastatin  20 mg Oral Daily  . cyanocobalamin  1,000 mcg Intramuscular Daily  . enoxaparin (LOVENOX) injection  40 mg Subcutaneous Q24H  . hydrochlorothiazide  25 mg Oral Daily  . lisinopril  20 mg Oral Daily  . metoprolol succinate  50 mg Oral Daily  . potassium chloride  20 mEq Oral Daily   Continuous Infusions:   Anti-infectives (From admission, onward)   None             Family Communication/Anticipated D/C date and plan/Code Status   DVT prophylaxis: enoxaparin (LOVENOX) injection 40 mg Start: 11/15/19 1000 SCDs Start: 11/14/19 1557     Code Status: Full Code  Family Communication: Plan discussed with patient Disposition Plan:    Status is: Inpatient  Remains inpatient appropriate because:Ongoing diagnostic testing needed not appropriate for outpatient work up   Dispo: The patient is from: Home              Anticipated  d/c is to: Home              Anticipated d/c date is: 2 days              Patient currently is not medically stable to d/c.           Subjective:   No abdominal pain, nausea or vomiting. She doesn't have good appetite.  Objective:    Vitals:   11/15/19 0120 11/15/19 0528 11/15/19 0929 11/15/19 1215  BP: (!) 185/94 (!) 160/83 (!) 154/86 (!) 160/72  Pulse: 90 79 85 76  Resp: 17 17 16 17   Temp: 98.6 F (37 C) 98.5 F (36.9 C) 98.2 F (36.8 C) 97.8 F (36.6 C)  TempSrc: Oral Oral Oral Oral  SpO2: 95% 99% 99% 100%  Weight:      Height:       No data found.  No intake or output data in the 24 hours ending 11/15/19 1352 Filed Weights   11/14/19 0916  Weight: 96.2 kg    Exam:  GEN: NAD SKIN: No rash EYES: EOMI, icteric, no pallor ENT: MMM CV: RRR PULM: CTA B ABD: soft, ND, NT, +BS CNS: AAO x 3, non focal EXT: No edema or tenderness  Data Reviewed:   I have personally reviewed following labs and imaging studies:  Labs: Labs show the following:   Basic Metabolic Panel: Recent Labs  Lab 11/14/19 0918 11/14/19 0918 11/14/19 2201 11/15/19 0537  NA 136  --  136 136  K 2.6*   < > 3.7 3.5  CL 96*  --  98 96*  CO2 27  --  26 29  GLUCOSE 164*  --  138* 133*  BUN 6*  --  5* <5*  CREATININE 0.83  --  0.78 0.64  CALCIUM 9.3  --  9.0 9.4  MG 2.0  --   --   --    < > = values in this interval not displayed.   GFR Estimated Creatinine Clearance: 77.9 mL/min (by C-G formula based on SCr of 0.64 mg/dL). Liver Function Tests: Recent Labs  Lab 11/14/19 0918 11/15/19 0537  AST 235* 242*  ALT 297* 279*  ALKPHOS 627* 654*  BILITOT 5.5* 6.1*  PROT 7.9 7.5  ALBUMIN 3.5 3.2*   Recent Labs  Lab 11/14/19 0928  LIPASE 22   No results for input(s): AMMONIA in the last 168 hours. Coagulation profile Recent Labs  Lab 11/15/19 0537  INR 1.0    CBC: Recent Labs  Lab 11/14/19 0918  WBC 6.5  NEUTROABS 4.5  HGB 11.0*  HCT 34.0*  MCV 71.3*  PLT  500*   Cardiac Enzymes: No results for input(s): CKTOTAL, CKMB, CKMBINDEX, TROPONINI in the last 168 hours. BNP (last 3 results) No results for input(s): PROBNP in the last 8760 hours. CBG: Recent Labs  Lab 11/15/19 0128  GLUCAP 131*   D-Dimer: No results for input(s): DDIMER in the last 72 hours. Hgb A1c: No results for input(s): HGBA1C in the last 72 hours. Lipid Profile: No results for input(s): CHOL, HDL, LDLCALC, TRIG, CHOLHDL, LDLDIRECT in the last 72 hours. Thyroid function studies: No results for input(s): TSH, T4TOTAL, T3FREE, THYROIDAB in the last 72 hours.  Invalid input(s): FREET3 Anemia work up: Recent Labs    11/14/19 0918  VITAMINB12 133*  FOLATE 15.2  FERRITIN 205   Sepsis Labs: Recent Labs  Lab 11/14/19 0918  WBC 6.5    Microbiology Recent Results (from the past 240 hour(s))  SARS Coronavirus 2 by RT PCR (hospital order, performed in [facility] hospital lab) Nasopharyngeal Nasopharyngeal Swab     Status: None   Collection Time: 11/14/19  3:47 PM   Specimen: Nasopharyngeal Swab  Result Value Ref Range Status   SARS Coronavirus 2 NEGATIVE NEGATIVE Final    Comment: (NOTE) SARS-CoV-2 target nucleic acids are NOT DETECTED.  The SARS-CoV-2 RNA is generally detectable in upper and lower respiratory specimens during the acute phase of infection. The lowest concentration of SARS-CoV-2 viral copies this assay can detect is 250 copies / mL. A negative result does not preclude SARS-CoV-2 infection and should not be used as the sole basis for treatment or other patient management decisions.  A negative result may occur with improper specimen collection / handling, submission of specimen other than nasopharyngeal swab, presence of viral mutation(s) within the areas targeted by this assay, and inadequate number of viral copies (<250 copies / mL). A negative result must be combined with clinical observations, patient history, and epidemiological  information.  Fact Sheet for Patients:   StrictlyIdeas.no  Fact Sheet for Healthcare Providers: BankingDealers.co.za  This test is not yet approved or  cleared by the [place] FDA and has been authorized for detection and/or diagnosis  of SARS-CoV-2 by FDA under an Emergency Use Authorization (EUA).  This EUA will remain in effect (meaning this test can be used) for the duration of the COVID-19 declaration under Section 564(b)(1) of the Act, 21 U.S.C. section 360bbb-3(b)(1), unless the authorization is terminated or revoked sooner.  Performed at [facility], [address] [place],  [address]     Procedures and diagnostic studies:  MR 3D Recon At Scanner  Result Date: 11/14/2019 CLINICAL DATA:  Cholelithiasis and biliary ductal dilatation on recent ultrasound. EXAM: MRI ABDOMEN WITHOUT AND WITH CONTRAST (INCLUDING MRCP) TECHNIQUE: Multiplanar multisequence MR imaging of the abdomen was performed both before and after the administration of intravenous contrast. Heavily T2-weighted images of the biliary and pancreatic ducts were obtained, and three-dimensional MRCP images were rendered by post processing. CONTRAST:  20mL GADAVIST GADOBUTROL 1 MMOL/ML IV SOLN COMPARISON:  Ultrasound on 11/14/2019 and noncontrast CT on 08/02/2018 FINDINGS: Lower chest: No acute findings. 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 Hepatobiliary: No hepatic masses identified. Gallbladder is dilated and contains tiny gallstones. Mild diffuse gallbladder wall thickening is seen, without pericholecystic inflammatory changes or fluid. Diffuse biliary ductal dilatation is new since previous study. Abrupt stricture of the distal common bile duct is seen in the pancreatic head. Pancreas: An irregular lesion with both solid and cystic areas is seen in the pancreatic head which measures 2.3 x 2.2 cm on image 47/17. A cystic lesion is also  seen in the pancreatic head and neck which measures 2.7 by 2.5 cm on image 21/4. These findings are suspicious for pancreatic carcinoma. Mild pancreatic ductal dilatation noted in the pancreatic body and tail. Spleen:  Within normal limits in size and appearance. Adrenals/Urinary Tract: No masses identified. No evidence of hydronephrosis. Stomach/Bowel: Visualized portion unremarkable. Vascular/Lymphatic: 11 mm peripancreatic lymph node is seen in the porta hepatis and 10 mm portacaval lymph node is also demonstrated (image 15/8). No other sites of lymphadenopathy identified within the abdomen. No abdominal aortic aneurysm. Other:  None. Musculoskeletal:  No suspicious bone lesions identified. IMPRESSION: New diffuse biliary ductal dilatation, with abrupt stricture of the distal common bile duct in the pancreatic head. 3 cm irregular predominantly solid lesion with cystic areas in the pancreatic head, highly suspicious for pancreatic carcinoma. Consider endoscopic ultrasound for further evaluation. Mild lymphadenopathy in the porta hepatis and portacaval region, suspicious for metastatic disease. Cholelithiasis and mild diffuse gallbladder wall thickening. Although this may be due to biliary obstruction, acute cholecystitis cannot definitely be excluded. Electronically Signed   By: Marlaine Hind M.D.   On: 11/14/2019 17:58   MR ABDOMEN MRCP W WO CONTAST  Result Date: 11/14/2019 CLINICAL DATA:  Cholelithiasis and biliary ductal dilatation on recent ultrasound. EXAM: MRI ABDOMEN WITHOUT AND WITH CONTRAST (INCLUDING MRCP) TECHNIQUE: Multiplanar multisequence MR imaging of the abdomen was performed both before and after the administration of intravenous contrast. Heavily T2-weighted images of the biliary and pancreatic ducts were obtained, and three-dimensional MRCP images were rendered by post processing. CONTRAST:  52mL GADAVIST GADOBUTROL 1 MMOL/ML IV SOLN COMPARISON:  Ultrasound on 11/14/2019 and noncontrast CT on  08/02/2018 FINDINGS: Lower chest: No acute findings. 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 Hepatobiliary: No hepatic masses identified. Gallbladder is dilated and contains tiny gallstones. Mild diffuse gallbladder wall thickening is seen, without pericholecystic inflammatory changes or fluid. Diffuse biliary ductal dilatation is new since previous study. Abrupt stricture of the distal common bile duct is seen in the pancreatic head.  Pancreas: An irregular lesion with both solid and cystic areas is seen in the pancreatic head which measures 2.3 x 2.2 cm on image 47/17. A cystic lesion is also seen in the pancreatic head and neck which measures 2.7 by 2.5 cm on image 21/4. These findings are suspicious for pancreatic carcinoma. Mild pancreatic ductal dilatation noted in the pancreatic body and tail. Spleen:  Within normal limits in size and appearance. Adrenals/Urinary Tract: No masses identified. No evidence of hydronephrosis. Stomach/Bowel: Visualized portion unremarkable. Vascular/Lymphatic: 11 mm peripancreatic lymph node is seen in the porta hepatis and 10 mm portacaval lymph node is also demonstrated (image 15/8). No other sites of lymphadenopathy identified within the abdomen. No abdominal aortic aneurysm. Other:  None. Musculoskeletal:  No suspicious bone lesions identified. IMPRESSION: New diffuse biliary ductal dilatation, with abrupt stricture of the distal common bile duct in the pancreatic head. 3 cm irregular predominantly solid lesion with cystic areas in the pancreatic head, highly suspicious for pancreatic carcinoma. Consider endoscopic ultrasound for further evaluation. Mild lymphadenopathy in the porta hepatis and portacaval region, suspicious for metastatic disease. Cholelithiasis and mild diffuse gallbladder wall thickening. Although this may be due to biliary obstruction, acute cholecystitis cannot definitely be excluded. Electronically Signed   By: Marlaine Hind M.D.   On:  11/14/2019 17:58   US Abdomen Limited RUQ  Result Date: 11/14/2019 CLINICAL DATA:  Elevated LFT EXAM: ULTRASOUND ABDOMEN LIMITED RIGHT UPPER QUADRANT COMPARISON:  CT abdomen pelvis 08/02/2018 FINDINGS: Gallbladder: Multiple gallstones measuring up to 7 mm in diameter. Negative sonographic Murphy sign. No gallbladder wall thickening or pericholecystic fluid. Common bile duct: Diameter: 13 mm.  Common bile duct is significantly dilated. Liver: No focal lesion identified. Within normal limits in parenchymal echogenicity. Portal vein is patent on color Doppler imaging with normal direction of blood flow towards the liver. Other: Negative for ascites IMPRESSION: Cholelithiasis without evidence of acute cholecystitis Common bile duct dilated at 13 mm. Possible common duct stone versus obstructing mass lesion. Electronically Signed   By: Franchot Gallo M.D.   On: 11/14/2019 13:39               LOS: 1 day   Caitlain Tweed  [facility]   Pager on [email or web address]. If 7PM-7AM, please contact night-coverage at [email or web address]     11/15/2019, 1:52 PM

## 2019-11-15 NOTE — ED Notes (Signed)
Pt to transport to 104 at 0100.

## 2019-11-15 NOTE — Progress Notes (Signed)
Robin Daniels , MD [address], [place], [place], [place], [address] [address], [place], [place], [place], [address] Phone: [phone]  Fax: [phone]   Robin Daniels is being followed for painless jaundice day 1 of follow up   Subjective: No complaints   Objective: Vital signs in last 24 hours: Vitals:   11/14/19 1953 11/15/19 0010 11/15/19 0120 11/15/19 0528  BP:  (!) 165/92 (!) 185/94 (!) 160/83  Pulse:  88 90 79  Resp:  14 17 17   Temp:   98.6 F (37 C) 98.5 F (36.9 C)  TempSrc:   Oral Oral  SpO2: 98% 98% 95% 99%  Weight:      Height:       Weight change:  No intake or output data in the 24 hours ending 11/15/19 0845   Exam:  Abdomen: soft, nontender, normal bowel sounds   Lab Results: @LABTEST2 @ Micro Results: Recent Results (from the past 240 hour(s))  SARS Coronavirus 2 by RT PCR (hospital order, performed in [place] hospital lab) Nasopharyngeal Nasopharyngeal Swab     Status: None   Collection Time: 11/14/19  3:47 PM   Specimen: Nasopharyngeal Swab  Result Value Ref Range Status   SARS Coronavirus 2 NEGATIVE NEGATIVE Final    Comment: (NOTE) SARS-CoV-2 target nucleic acids are NOT DETECTED.  The SARS-CoV-2 RNA is generally detectable in upper and lower respiratory specimens during the acute phase of infection. The lowest concentration of SARS-CoV-2 viral copies this assay can detect is 250 copies / mL. A negative result does not preclude SARS-CoV-2 infection and should not be used as the sole basis for treatment or other patient management decisions.  A negative result may occur with improper specimen collection / handling, submission of specimen other than nasopharyngeal swab, presence of viral mutation(s) within the areas targeted by this assay, and inadequate number of viral copies (<250 copies / mL). A negative result must be combined with clinical observations, patient history, and epidemiological information.  Fact Sheet for  Patients:   StrictlyIdeas.no  Fact Sheet for Healthcare Providers: BankingDealers.co.za  This test is not yet approved or  cleared by the [place] FDA and has been authorized for detection and/or diagnosis of SARS-CoV-2 by FDA under an Emergency Use Authorization (EUA).  This EUA will remain in effect (meaning this test can be used) for the duration of the COVID-19 declaration under Section 564(b)(1) of the Act, 21 U.S.C. section 360bbb-3(b)(1), unless the authorization is terminated or revoked sooner.  Performed at [facility], [address] [place], [place] [address]    Studies/Results: MR 3D Recon At Scanner  Result Date: 11/14/2019 CLINICAL DATA:  Cholelithiasis and biliary ductal dilatation on recent ultrasound. EXAM: MRI ABDOMEN WITHOUT AND WITH CONTRAST (INCLUDING MRCP) TECHNIQUE: Multiplanar multisequence MR imaging of the abdomen was performed both before and after the administration of intravenous contrast. Heavily T2-weighted images of the biliary and pancreatic ducts were obtained, and three-dimensional MRCP images were rendered by post processing. CONTRAST:  8mL GADAVIST GADOBUTROL 1 MMOL/ML IV SOLN COMPARISON:  Ultrasound on 11/14/2019 and noncontrast CT on 08/02/2018 FINDINGS: Lower chest: No acute findings. 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 Hepatobiliary: No hepatic masses identified. Gallbladder is dilated and contains tiny gallstones. Mild diffuse gallbladder wall thickening is seen, without pericholecystic inflammatory changes or fluid. Diffuse biliary ductal dilatation is new since previous study. Abrupt stricture of the distal common bile duct is seen in the pancreatic head. Pancreas: An irregular  lesion with both solid and cystic areas is seen in the pancreatic head which measures 2.3 x 2.2 cm on image 47/17. A cystic lesion is also seen in the pancreatic head and neck which measures  2.7 by 2.5 cm on image 21/4. These findings are suspicious for pancreatic carcinoma. Mild pancreatic ductal dilatation noted in the pancreatic body and tail. Spleen:  Within normal limits in size and appearance. Adrenals/Urinary Tract: No masses identified. No evidence of hydronephrosis. Stomach/Bowel: Visualized portion unremarkable. Vascular/Lymphatic: 11 mm peripancreatic lymph node is seen in the porta hepatis and 10 mm portacaval lymph node is also demonstrated (image 15/8). No other sites of lymphadenopathy identified within the abdomen. No abdominal aortic aneurysm. Other:  None. Musculoskeletal:  No suspicious bone lesions identified. IMPRESSION: New diffuse biliary ductal dilatation, with abrupt stricture of the distal common bile duct in the pancreatic head. 3 cm irregular predominantly solid lesion with cystic areas in the pancreatic head, highly suspicious for pancreatic carcinoma. Consider endoscopic ultrasound for further evaluation. Mild lymphadenopathy in the porta hepatis and portacaval region, suspicious for metastatic disease. Cholelithiasis and mild diffuse gallbladder wall thickening. Although this may be due to biliary obstruction, acute cholecystitis cannot definitely be excluded. Electronically Signed   By: Marlaine Hind M.D.   On: 11/14/2019 17:58   MR ABDOMEN MRCP W WO CONTAST  Result Date: 11/14/2019 CLINICAL DATA:  Cholelithiasis and biliary ductal dilatation on recent ultrasound. EXAM: MRI ABDOMEN WITHOUT AND WITH CONTRAST (INCLUDING MRCP) TECHNIQUE: Multiplanar multisequence MR imaging of the abdomen was performed both before and after the administration of intravenous contrast. Heavily T2-weighted images of the biliary and pancreatic ducts were obtained, and three-dimensional MRCP images were rendered by post processing. CONTRAST:  43mL GADAVIST GADOBUTROL 1 MMOL/ML IV SOLN COMPARISON:  Ultrasound on 11/14/2019 and noncontrast CT on 08/02/2018 FINDINGS: Lower chest: No acute  findings. 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 Hepatobiliary: No hepatic masses identified. Gallbladder is dilated and contains tiny gallstones. Mild diffuse gallbladder wall thickening is seen, without pericholecystic inflammatory changes or fluid. Diffuse biliary ductal dilatation is new since previous study. Abrupt stricture of the distal common bile duct is seen in the pancreatic head. Pancreas: An irregular lesion with both solid and cystic areas is seen in the pancreatic head which measures 2.3 x 2.2 cm on image 47/17. A cystic lesion is also seen in the pancreatic head and neck which measures 2.7 by 2.5 cm on image 21/4. These findings are suspicious for pancreatic carcinoma. Mild pancreatic ductal dilatation noted in the pancreatic body and tail. Spleen:  Within normal limits in size and appearance. Adrenals/Urinary Tract: No masses identified. No evidence of hydronephrosis. Stomach/Bowel: Visualized portion unremarkable. Vascular/Lymphatic: 11 mm peripancreatic lymph node is seen in the porta hepatis and 10 mm portacaval lymph node is also demonstrated (image 15/8). No other sites of lymphadenopathy identified within the abdomen. No abdominal aortic aneurysm. Other:  None. Musculoskeletal:  No suspicious bone lesions identified. IMPRESSION: New diffuse biliary ductal dilatation, with abrupt stricture of the distal common bile duct in the pancreatic head. 3 cm irregular predominantly solid lesion with cystic areas in the pancreatic head, highly suspicious for pancreatic carcinoma. Consider endoscopic ultrasound for further evaluation. Mild lymphadenopathy in the porta hepatis and portacaval region, suspicious for metastatic disease. Cholelithiasis and mild diffuse gallbladder wall thickening. Although this may be due to biliary obstruction, acute cholecystitis cannot definitely be excluded. Electronically Signed   By: Marlaine Hind M.D.   On: 11/14/2019 17:58  US Abdomen Limited  RUQ  Result Date: 11/14/2019 CLINICAL DATA:  Elevated LFT EXAM: ULTRASOUND ABDOMEN LIMITED RIGHT UPPER QUADRANT COMPARISON:  CT abdomen pelvis 08/02/2018 FINDINGS: Gallbladder: Multiple gallstones measuring up to 7 mm in diameter. Negative sonographic Murphy sign. No gallbladder wall thickening or pericholecystic fluid. Common bile duct: Diameter: 13 mm.  Common bile duct is significantly dilated. Liver: No focal lesion identified. Within normal limits in parenchymal echogenicity. Portal vein is patent on color Doppler imaging with normal direction of blood flow towards the liver. Other: Negative for ascites IMPRESSION: Cholelithiasis without evidence of acute cholecystitis Common bile duct dilated at 13 mm. Possible common duct stone versus obstructing mass lesion. Electronically Signed   By: Franchot Gallo M.D.   On: 11/14/2019 13:39   Medications: I have reviewed the patient's current medications. Scheduled Meds: . atorvastatin  20 mg Oral Daily  . enoxaparin (LOVENOX) injection  40 mg Subcutaneous Q24H  . hydrochlorothiazide  25 mg Oral Daily  . lisinopril  20 mg Oral Daily  . metoprolol succinate  50 mg Oral Daily  . potassium chloride  20 mEq Oral Daily   Continuous Infusions: PRN Meds:.acetaminophen **OR** acetaminophen, ondansetron **OR** ondansetron (ZOFRAN) IV, pentafluoroprop-tetrafluoroeth, polyethylene glycol   Assessment: Principal Problem:   Choledocholithiasis Active Problems:   Hypokalemia   Gallstones   Elevated liver enzymes  ZURIE PLATAS is a 64 y.o. y/o female presented the emergency room with abnormal liver function test.  Recently noted dark urine.  Found to have an elevated total bilirubin of 5.5.  Common bile duct diameter of 13 mm right upper quadrant ultrasound.  Cholelithiasis\\. MRCP demonstrated new diffuse biliary ductal dilation with abrupt stricture of the distal common bile duct in the pancreatic head.  3 cm irregular predominantly solid lesion with cystic  areas in the pancreatic head suspicious for pancreatic carcinoma.  Mild lymphadenopathy in the porta hepatis suggestive of metastatic disease.  Total bilirubin 6.1 with elevated transaminases and alkaline phosphatase.   Plan 1.    ERCP  With Dr Allen Norris tomorrow for treatment with a biliary stent due to presence of stricture leading to obstruction which is likely from the underlying pancreatic mass.  She would require oncology evaluation. 2.    Microcytic anemia incidental finding: Vitamin B12 very low at 133, suggest to replace ferritin normal.  I have discussed alternative options, risks & benefits,  which include, but are not limited to, bleeding, infection, perforation,respiratory complication,drug reaction, pancreatitis and death.  The patient agrees with this plan & written consent will be obtained.       LOS: 1 day   Robin Bellows, MD 11/15/2019, 8:45 AM

## 2019-11-16 ENCOUNTER — Other Ambulatory Visit: Payer: Self-pay

## 2019-11-16 ENCOUNTER — Encounter: Payer: Self-pay | Admitting: Internal Medicine

## 2019-11-16 ENCOUNTER — Inpatient Hospital Stay: Payer: Medicare Other | Admitting: Certified Registered"

## 2019-11-16 ENCOUNTER — Encounter
Admission: EM | Disposition: A | Discharge status: Home / Self Care | Payer: Self-pay | Source: Home / Self Care | Attending: Internal Medicine

## 2019-11-16 ENCOUNTER — Inpatient Hospital Stay: Payer: Medicare Other

## 2019-11-16 DIAGNOSIS — K831 Obstruction of bile duct: Principal | ICD-10-CM

## 2019-11-16 DIAGNOSIS — K8689 Other specified diseases of pancreas: Secondary | ICD-10-CM

## 2019-11-16 HISTORY — PX: ENDOSCOPIC RETROGRADE CHOLANGIOPANCREATOGRAPHY (ERCP) WITH PROPOFOL: SHX5810

## 2019-11-16 LAB — COMPREHENSIVE METABOLIC PANEL
ALT: 314 U/L — ABNORMAL HIGH (ref 0–44)
AST: 323 U/L — ABNORMAL HIGH (ref 15–41)
Albumin: 3.2 g/dL — ABNORMAL LOW (ref 3.5–5.0)
Alkaline Phosphatase: 704 U/L — ABNORMAL HIGH (ref 38–126)
Anion gap: 10 (ref 5–15)
BUN: 9 mg/dL (ref 8–23)
CO2: 31 mmol/L (ref 22–32)
Calcium: 9.3 mg/dL (ref 8.9–10.3)
Chloride: 94 mmol/L — ABNORMAL LOW (ref 98–111)
Creatinine, Ser: 0.91 mg/dL (ref 0.44–1.00)
GFR calc Af Amer: >60 mL/min (ref >60)
GFR calc non Af Amer: >60 mL/min (ref >60)
Glucose, Bld: 122 mg/dL — ABNORMAL HIGH (ref 70–99)
Potassium: 3.3 mmol/L — ABNORMAL LOW (ref 3.5–5.1)
Sodium: 135 mmol/L (ref 135–145)
Total Bilirubin: 7.3 mg/dL — ABNORMAL HIGH (ref 0.3–1.2)
Total Protein: 7.6 g/dL (ref 6.5–8.1)

## 2019-11-16 LAB — MAGNESIUM: Magnesium: 2.1 mg/dL (ref 1.7–2.4)

## 2019-11-16 SURGERY — ENDOSCOPIC RETROGRADE CHOLANGIOPANCREATOGRAPHY (ERCP) WITH PROPOFOL
Anesthesia: General

## 2019-11-16 MED ORDER — SUCCINYLCHOLINE CHLORIDE 200 MG/10ML IV SOSY
PREFILLED_SYRINGE | INTRAVENOUS | Status: AC
  Filled 2019-11-16: qty 10

## 2019-11-16 MED ORDER — LACTATED RINGERS IV SOLN: INTRAVENOUS | Status: DC

## 2019-11-16 MED ORDER — SUGAMMADEX SODIUM 200 MG/2ML IV SOLN
INTRAVENOUS | Status: DC | PRN
  Administered 2019-11-16: 200 mg via INTRAVENOUS

## 2019-11-16 MED ORDER — PROMETHAZINE HCL 25 MG/ML IJ SOLN: 6.2500 mg | INTRAMUSCULAR | Status: DC | PRN

## 2019-11-16 MED ORDER — DEXAMETHASONE SODIUM PHOSPHATE 10 MG/ML IJ SOLN
INTRAMUSCULAR | Status: DC | PRN
  Administered 2019-11-16: 10 mg via INTRAVENOUS

## 2019-11-16 MED ORDER — GLYCOPYRROLATE 0.2 MG/ML IJ SOLN
INTRAMUSCULAR | Status: DC | PRN
  Administered 2019-11-16: .2 mg via INTRAVENOUS

## 2019-11-16 MED ORDER — LIDOCAINE HCL (CARDIAC) PF 100 MG/5ML IV SOSY
PREFILLED_SYRINGE | INTRAVENOUS | Status: DC | PRN
  Administered 2019-11-16: 100 mg via INTRAVENOUS

## 2019-11-16 MED ORDER — MIDAZOLAM HCL 2 MG/2ML IJ SOLN
INTRAMUSCULAR | Status: AC
  Filled 2019-11-16: qty 2

## 2019-11-16 MED ORDER — INDOMETHACIN 50 MG RE SUPP
100.0000 mg | Freq: Once | RECTAL | Status: DC
  Filled 2019-11-16: qty 2

## 2019-11-16 MED ORDER — ONDANSETRON HCL 4 MG/2ML IJ SOLN
INTRAMUSCULAR | Status: DC | PRN
  Administered 2019-11-16: 4 mg via INTRAVENOUS

## 2019-11-16 MED ORDER — POTASSIUM CHLORIDE CRYS ER 20 MEQ PO TBCR: 40.0000 meq | EXTENDED_RELEASE_TABLET | Freq: Once | ORAL | Status: DC

## 2019-11-16 MED ORDER — INDOMETHACIN 50 MG RE SUPP
RECTAL | Status: DC | PRN
  Administered 2019-11-16: 100 mg via RECTAL

## 2019-11-16 MED ORDER — LIDOCAINE HCL (PF) 2 % IJ SOLN
INTRAMUSCULAR | Status: AC
  Filled 2019-11-16: qty 40

## 2019-11-16 MED ORDER — ROCURONIUM BROMIDE 100 MG/10ML IV SOLN
INTRAVENOUS | Status: DC | PRN
  Administered 2019-11-16: 30 mg via INTRAVENOUS

## 2019-11-16 MED ORDER — POTASSIUM CHLORIDE CRYS ER 20 MEQ PO TBCR
40.0000 meq | EXTENDED_RELEASE_TABLET | Freq: Once | ORAL | Status: AC
  Administered 2019-11-16: 40 meq via ORAL
  Filled 2019-11-16: qty 2

## 2019-11-16 MED ORDER — FENTANYL CITRATE (PF) 100 MCG/2ML IJ SOLN
INTRAMUSCULAR | Status: AC
  Administered 2019-11-16: 25 ug via INTRAVENOUS
  Filled 2019-11-16: qty 2

## 2019-11-16 MED ORDER — FENTANYL CITRATE (PF) 100 MCG/2ML IJ SOLN
25.0000 ug | INTRAMUSCULAR | Status: DC | PRN
  Administered 2019-11-16: 25 ug via INTRAVENOUS

## 2019-11-16 MED ORDER — PROPOFOL 10 MG/ML IV BOLUS
INTRAVENOUS | Status: DC | PRN
  Administered 2019-11-16: 200 mg via INTRAVENOUS

## 2019-11-16 MED ORDER — OXYCODONE HCL 5 MG PO TABS
5.0000 mg | ORAL_TABLET | Freq: Four times a day (QID) | ORAL | Status: DC | PRN
  Administered 2019-11-16 – 2019-11-17 (×2): 5 mg via ORAL
  Filled 2019-11-16 (×2): qty 1

## 2019-11-16 MED ORDER — INDOMETHACIN 50 MG RE SUPP
RECTAL | Status: AC
  Filled 2019-11-16: qty 2

## 2019-11-16 MED ORDER — MORPHINE SULFATE (PF) 2 MG/ML IV SOLN
2.0000 mg | Freq: Four times a day (QID) | INTRAVENOUS | Status: DC | PRN
  Administered 2019-11-16: 22:00:00 2 mg via INTRAVENOUS
  Filled 2019-11-16: qty 1

## 2019-11-16 MED ORDER — GLYCOPYRROLATE 0.2 MG/ML IJ SOLN
INTRAMUSCULAR | Status: AC
  Filled 2019-11-16: qty 8

## 2019-11-16 MED ORDER — ROCURONIUM BROMIDE 10 MG/ML (PF) SYRINGE
PREFILLED_SYRINGE | INTRAVENOUS | Status: AC
  Filled 2019-11-16: qty 10

## 2019-11-16 MED ORDER — SUCCINYLCHOLINE CHLORIDE 20 MG/ML IJ SOLN
INTRAMUSCULAR | Status: DC | PRN
  Administered 2019-11-16: 100 mg via INTRAVENOUS

## 2019-11-16 MED ORDER — MIDAZOLAM HCL 2 MG/2ML IJ SOLN
INTRAMUSCULAR | Status: DC | PRN
  Administered 2019-11-16: 2 mg via INTRAVENOUS

## 2019-11-16 MED ORDER — PHENYLEPHRINE HCL (PRESSORS) 10 MG/ML IV SOLN
INTRAVENOUS | Status: DC | PRN
  Administered 2019-11-16: 200 ug via INTRAVENOUS
  Administered 2019-11-16: 100 ug via INTRAVENOUS
  Administered 2019-11-16: 200 ug via INTRAVENOUS
  Administered 2019-11-16: 100 ug via INTRAVENOUS

## 2019-11-16 NOTE — Op Note (Signed)
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 Procedure:             Pre-Anesthesia Assessment:                        - Prior to the procedure, a History and Physical was                         performed, and patient medications and allergies were                         reviewed. The patient's tolerance of previous                         anesthesia was also reviewed. The risks and benefits                         of the procedure and the sedation options and risks                         were discussed with the patient. All questions were                         answered, and informed consent was obtained. Prior                         Anticoagulants: The patient has taken no previous                         anticoagulant or antiplatelet agents. ASA Grade                         Assessment: II - A patient with mild systemic disease.                         After reviewing the risks and benefits, the patient                         was deemed in satisfactory condition to undergo the                         procedure.                        After obtaining informed consent, the scope was passed  under direct vision. Throughout the procedure, the                         patient's blood pressure, pulse, and oxygen                         saturations were monitored continuously. The was                         introduced through the mouth, and used to inject                          contrast into and used to inject contrast into the                         bile duct. The ERCP was accomplished without                         difficulty. The patient tolerated the procedure well. Findings:      The scout film was normal. The esophagus was successfully intubated       under direct vision. The scope was advanced to a normal major papilla in       the descending duodenum without detailed examination of the pharynx,       larynx and associated structures, and upper GI tract. The upper GI tract       was grossly normal. The bile duct was deeply cannulated with the       short-nosed traction sphincterotome. Contrast was injected. I personally       interpreted the bile duct images. There was brisk flow of contrast       through the ducts. Image quality was excellent. Contrast extended to the       entire biliary tree. The lower third of the main bile duct contained a       single segmental stenosis. The main bile duct was diffusely dilated. A       wire was passed into the biliary tree. A 6 mm biliary sphincterotomy was       made with a traction (standard) sphincterotome using ERBE       electrocautery. There was no post-sphincterotomy bleeding. One 10 Fr by       7 cm plastic stent with a single external flap and a single internal       flap was placed 5 cm into the common bile duct. Clear fluid flowed       through the stent. The stent was in good position. Impression:            - A single segmental biliary stricture was found in                         the lower third of the main bile duct. The stricture                         was malignant appearing.                        - The entire main bile duct was dilated.                        -  A biliary sphincterotomy was performed.                        - One plastic stent was placed into the common bile                         duct. Recommendation:        - Return patient to hospital ward for ongoing care.                         - Clear liquid diet today.                        - Repeat ERCP in 3 months to exchange stent. Procedure Code(s):     --- Professional ---                        [phone], Endoscopic retrograde cholangiopancreatography                         (ERCP); with placement of endoscopic stent into                         biliary or pancreatic duct, including pre- and                         post-dilation and guide wire passage, when performed,                         including sphincterotomy, when performed, each stent                        [id number], Endoscopic catheterization of the biliary                         ductal system, radiological supervision and                         interpretation Diagnosis Code(s):     --- Professional ---                        D49.0, Neoplasm of unspecified behavior of digestive                         system                        K83.1, Obstruction of bile duct                        K83.8, Other specified diseases of biliary tract CPT copyright 2019 American Medical Association. All rights reserved. The codes documented in this report are preliminary and upon coder review may  be revised to meet current compliance requirements. Lucilla Lame MD, MD 11/16/2019 12:38:23 PM This report has been signed electronically. Number of Addenda: 0 Note Initiated On: 11/16/2019 11:59 AM Estimated Blood Loss:  Estimated blood loss: none.      [facility]

## 2019-11-16 NOTE — Anesthesia Preprocedure Evaluation (Addendum)
Anesthesia Evaluation  Patient identified by MRN, date of birth, ID band Patient awake    Reviewed: Allergy & Precautions, H&P , NPO status , Patient's Chart, lab work & pertinent test results, reviewed documented beta blocker date and time   History of Anesthesia Complications Negative for: history of anesthetic complications  Airway Mallampati: III  TM Distance: >3 FB Neck ROM: full    Dental  (+) Dental Advidsory Given, Teeth Intact, Missing, Poor Dentition   Pulmonary neg pulmonary ROS,    Pulmonary exam normal breath sounds clear to auscultation       Cardiovascular Exercise Tolerance: Good hypertension, (-) angina(-) Past MI and (-) Cardiac Stents Normal cardiovascular exam+ dysrhythmias (palpitations) (-) Valvular Problems/Murmurs Rhythm:regular Rate:Normal     Neuro/Psych PSYCHIATRIC DISORDERS Anxiety negative neurological ROS     GI/Hepatic negative GI ROS, Neg liver ROS,   Endo/Other  diabetes (borderline)  Renal/GU negative Renal ROS  negative genitourinary   Musculoskeletal   Abdominal   Peds  Hematology  (+) HIV,   Anesthesia Other Findings Past Medical History: No date: Anemia     Comment:  history of 09/11/2014: Anxiety No date: Arthritis No date: Diabetes mellitus without complication (HCC) No date: Fluttering heart No date: Hyperlipidemia No date: Hypertension   Reproductive/Obstetrics negative OB ROS                            Anesthesia Physical Anesthesia Plan  ASA: II  Anesthesia Plan: General   Post-op Pain Management:    Induction: Intravenous  PONV Risk Score and Plan: 3 and Ondansetron, Dexamethasone, Midazolam and Treatment may vary due to age or medical condition  Airway Management Planned: Oral ETT  Additional Equipment:   Intra-op Plan:   Post-operative Plan: Extubation in OR  Informed Consent: I have reviewed the patients History and  Physical, chart, labs and discussed the procedure including the risks, benefits and alternatives for the proposed anesthesia with the patient or authorized representative who has indicated his/her understanding and acceptance.     Dental Advisory Given  Plan Discussed with: Anesthesiologist, CRNA and Surgeon  Anesthesia Plan Comments:         Anesthesia Quick Evaluation

## 2019-11-16 NOTE — Care Management Important Message (Signed)
Important Message  Patient Details  Name: Robin Daniels MRN: [id number] Date of Birth: 1955/10/22   Medicare Important Message Given:  Yes  Initial Medicare IM given by Patient Access Associate on 11/15/2019 at 10:52am.   Dannette Barbara 11/16/2019, 8:25 AM

## 2019-11-16 NOTE — Transfer of Care (Signed)
Immediate Anesthesia Transfer of Care Note  Patient: Robin Daniels  Procedure(s) Performed: ENDOSCOPIC RETROGRADE CHOLANGIOPANCREATOGRAPHY (ERCP) WITH PROPOFOL (N/A )  Patient Location: PACU  Anesthesia Type:General  Level of Consciousness: awake and patient cooperative  Airway & Oxygen Therapy: Patient Spontanous Breathing and Patient connected to face mask oxygen  Post-op Assessment: Report given to RN and Post -op Vital signs reviewed and stable  Post vital signs: Reviewed and stable  Last Vitals:  Vitals Value Taken Time  BP 158/82 11/16/19 1245  Temp 36.2 C 11/16/19 1245  Pulse 84 11/16/19 1251  Resp 20 11/16/19 1251  SpO2 100 % 11/16/19 1251  Vitals shown include unvalidated device data.  Last Pain:  Vitals:   11/16/19 1245  TempSrc:   PainSc: Asleep         Complications: No complications documented.

## 2019-11-16 NOTE — Progress Notes (Signed)
Progress Note    Robin Daniels  MHD:[id number] DOB: 05/12/1955  DOA: 11/14/2019 PCP: [facility]      Brief Narrative:    Medical records reviewed and are as summarized below:  Robin Daniels is a 64 y.o. 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   Started on clear liquid diet. Continue vitamin B12 injection Continue to replete potassium Continue antihypertensives Monitor BMP    Body mass index is 35.96 kg/m.  Diet Order            Diet clear liquid Room service appropriate? Yes; Fluid consistency: Thin  Diet effective now                       Medications:   . atorvastatin  20 mg Oral Daily  . cyanocobalamin  1,000 mcg Intramuscular Daily  . enoxaparin (LOVENOX) injection  40 mg Subcutaneous Q24H  . hydrochlorothiazide  25 mg Oral Daily  . indomethacin  100 mg Rectal Once  . indomethacin      . lisinopril  20 mg Oral Daily  . metoprolol succinate  50 mg Oral Daily  . potassium chloride  20 mEq Oral Daily  . potassium chloride  40 mEq Oral Once   Continuous Infusions:   Anti-infectives (From admission, onward)   None             Family Communication/Anticipated D/C date and plan/Code Status   DVT prophylaxis: enoxaparin (LOVENOX) injection 40 mg Start: 11/15/19 1000 SCDs Start: 11/14/19 1557     Code Status: Full Code  Family Communication: Plan discussed with patient Disposition Plan:    Status is: Inpatient  Remains inpatient appropriate because:Ongoing diagnostic testing needed not appropriate for outpatient work  up   Dispo: The patient is from: Home              Anticipated d/c is to: Home              Anticipated d/c date is: 2 days              Patient currently is not medically stable to d/c.           Subjective:   No complaints.  No abdominal pain or vomiting.  No headache or dizziness  Objective:    Vitals:   11/16/19 1245 11/16/19 1306 11/16/19 1324 11/16/19 1404  BP: (!) 158/82 (!) 166/76 (!) 176/72 (!) 161/79  Pulse: 80  84 87  Resp: 17  14 16   Temp: (!) 97.2 F (36.2 C)  98.1 F (36.7 C) 98.3 F (36.8 C)  TempSrc:    Oral  SpO2: 100%  97% 100%  Weight:      Height:       No data found.   Intake/Output Summary (Last 24 hours) at 11/16/2019 1454 Last data filed at 11/16/2019 1324 Gross per 24 hour  Intake 400 ml  Output --  Net 400 ml   Filed Weights   11/14/19 0916 11/16/19 1137  Weight: 96.2 kg 92.1 kg    Exam:  GEN: No  acute distress  SKIN: Warm and dry EYES: icteric, no pallor ENT: MMM CV: RRR PULM: No wheezing or rales heard ABD: soft, ND, NT, +BS CNS: AAO x 3, non focal EXT: No edema or tenderness   Data Reviewed:   I have personally reviewed following labs and imaging studies:  Labs: Labs show the following:   Basic Metabolic Panel: Recent Labs  Lab 11/14/19 0918 11/14/19 0918 11/14/19 2201 11/14/19 2201 11/15/19 0537 11/16/19 0631  NA 136  --  136  --  136 135  K 2.6*   < > 3.7   < > 3.5 3.3*  CL 96*  --  98  --  96* 94*  CO2 27  --  26  --  29 31  GLUCOSE 164*  --  138*  --  133* 122*  BUN 6*  --  5*  --  <5* 9  CREATININE 0.83  --  0.78  --  0.64 0.91  CALCIUM 9.3  --  9.0  --  9.4 9.3  MG 2.0  --   --   --   --   --    < > = values in this interval not displayed.   GFR Estimated Creatinine Clearance: 65.6 mL/min (by C-G formula based on SCr of 0.91 mg/dL). Liver Function Tests: Recent Labs  Lab 11/14/19 0918 11/15/19 0537 11/16/19 0631  AST 235* 242* 323*  ALT 297* 279* 314*  ALKPHOS 627* 654* 704*   BILITOT 5.5* 6.1* 7.3*  PROT 7.9 7.5 7.6  ALBUMIN 3.5 3.2* 3.2*   Recent Labs  Lab 11/14/19 0928  LIPASE 22   No results for input(s): AMMONIA in the last 168 hours. Coagulation profile Recent Labs  Lab 11/15/19 0537  INR 1.0    CBC: Recent Labs  Lab 11/14/19 0918  WBC 6.5  NEUTROABS 4.5  HGB 11.0*  HCT 34.0*  MCV 71.3*  PLT 500*   Cardiac Enzymes: No results for input(s): CKTOTAL, CKMB, CKMBINDEX, TROPONINI in the last 168 hours. BNP (last 3 results) No results for input(s): PROBNP in the last 8760 hours. CBG: Recent Labs  Lab 11/15/19 0128  GLUCAP 131*   D-Dimer: No results for input(s): DDIMER in the last 72 hours. Hgb A1c: No results for input(s): HGBA1C in the last 72 hours. Lipid Profile: No results for input(s): CHOL, HDL, LDLCALC, TRIG, CHOLHDL, LDLDIRECT in the last 72 hours. Thyroid function studies: No results for input(s): TSH, T4TOTAL, T3FREE, THYROIDAB in the last 72 hours.  Invalid input(s): FREET3 Anemia work up: Recent Labs    11/14/19 0918  VITAMINB12 133*  FOLATE 15.2  FERRITIN 205   Sepsis Labs: Recent Labs  Lab 11/14/19 0918  WBC 6.5    Microbiology Recent Results (from the past 240 hour(s))  SARS Coronavirus 2 by RT PCR (hospital order, performed in [facility] hospital lab) Nasopharyngeal Nasopharyngeal Swab     Status: None   Collection Time: 11/14/19  3:47 PM   Specimen: Nasopharyngeal Swab  Result Value Ref Range Status   SARS Coronavirus 2 NEGATIVE NEGATIVE Final    Comment: (NOTE) SARS-CoV-2 target nucleic acids are NOT DETECTED.  The SARS-CoV-2 RNA is generally detectable in upper and lower respiratory specimens during the acute phase of infection. The lowest concentration of SARS-CoV-2 viral copies this assay can detect is 250 copies / mL. A negative result does not preclude SARS-CoV-2 infection and should not be used as the sole basis for treatment or other patient management decisions.  A negative  result  may occur with improper specimen collection / handling, submission of specimen other than nasopharyngeal swab, presence of viral mutation(s) within the areas targeted by this assay, and inadequate number of viral copies (<250 copies / mL). A negative result must be combined with clinical observations, patient history, and epidemiological information.  Fact Sheet for Patients:   StrictlyIdeas.no  Fact Sheet for Healthcare Providers: BankingDealers.co.za  This test is not yet approved or  cleared by the [place] FDA and has been authorized for detection and/or diagnosis of SARS-CoV-2 by FDA under an Emergency Use Authorization (EUA).  This EUA will remain in effect (meaning this test can be used) for the duration of the COVID-19 declaration under Section 564(b)(1) of the Act, 21 U.S.C. section 360bbb-3(b)(1), unless the authorization is terminated or revoked sooner.  Performed at [facility], [address] [place], [place] [address]     Procedures and diagnostic studies:  MR 3D Recon At Scanner  Result Date: 11/14/2019 CLINICAL DATA:  Cholelithiasis and biliary ductal dilatation on recent ultrasound. EXAM: MRI ABDOMEN WITHOUT AND WITH CONTRAST (INCLUDING MRCP) TECHNIQUE: Multiplanar multisequence MR imaging of the abdomen was performed both before and after the administration of intravenous contrast. Heavily T2-weighted images of the biliary and pancreatic ducts were obtained, and three-dimensional MRCP images were rendered by post processing. CONTRAST:  53mL GADAVIST GADOBUTROL 1 MMOL/ML IV SOLN COMPARISON:  Ultrasound on 11/14/2019 and noncontrast CT on 08/02/2018 FINDINGS: Lower chest: No acute findings. 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 Hepatobiliary: No hepatic masses identified. Gallbladder is dilated and contains tiny gallstones. Mild diffuse gallbladder wall thickening is seen, without  pericholecystic inflammatory changes or fluid. Diffuse biliary ductal dilatation is new since previous study. Abrupt stricture of the distal common bile duct is seen in the pancreatic head. Pancreas: An irregular lesion with both solid and cystic areas is seen in the pancreatic head which measures 2.3 x 2.2 cm on image 47/17. A cystic lesion is also seen in the pancreatic head and neck which measures 2.7 by 2.5 cm on image 21/4. These findings are suspicious for pancreatic carcinoma. Mild pancreatic ductal dilatation noted in the pancreatic body and tail. Spleen:  Within normal limits in size and appearance. Adrenals/Urinary Tract: No masses identified. No evidence of hydronephrosis. Stomach/Bowel: Visualized portion unremarkable. Vascular/Lymphatic: 11 mm peripancreatic lymph node is seen in the porta hepatis and 10 mm portacaval lymph node is also demonstrated (image 15/8). No other sites of lymphadenopathy identified within the abdomen. No abdominal aortic aneurysm. Other:  None. Musculoskeletal:  No suspicious bone lesions identified. IMPRESSION: New diffuse biliary ductal dilatation, with abrupt stricture of the distal common bile duct in the pancreatic head. 3 cm irregular predominantly solid lesion with cystic areas in the pancreatic head, highly suspicious for pancreatic carcinoma. Consider endoscopic ultrasound for further evaluation. Mild lymphadenopathy in the porta hepatis and portacaval region, suspicious for metastatic disease. Cholelithiasis and mild diffuse gallbladder wall thickening. Although this may be due to biliary obstruction, acute cholecystitis cannot definitely be excluded. Electronically Signed   By: Marlaine Hind M.D.   On: 11/14/2019 17:58   DG C-Arm 1-60 Min-No Report  Result Date: 11/16/2019 Fluoroscopy was utilized by the requesting physician.  No radiographic interpretation.   MR ABDOMEN MRCP W WO CONTAST  Result Date: 11/14/2019 CLINICAL DATA:  Cholelithiasis and biliary  ductal dilatation on recent ultrasound. EXAM: MRI ABDOMEN WITHOUT AND WITH CONTRAST (INCLUDING MRCP) TECHNIQUE: Multiplanar multisequence MR imaging of the abdomen was performed  both before and after the administration of intravenous contrast. Heavily T2-weighted images of the biliary and pancreatic ducts were obtained, and three-dimensional MRCP images were rendered by post processing. CONTRAST:  27mL GADAVIST GADOBUTROL 1 MMOL/ML IV SOLN COMPARISON:  Ultrasound on 11/14/2019 and noncontrast CT on 08/02/2018 FINDINGS: Lower chest: No acute findings. 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 Hepatobiliary: No hepatic masses identified. Gallbladder is dilated and contains tiny gallstones. Mild diffuse gallbladder wall thickening is seen, without pericholecystic inflammatory changes or fluid. Diffuse biliary ductal dilatation is new since previous study. Abrupt stricture of the distal common bile duct is seen in the pancreatic head. Pancreas: An irregular lesion with both solid and cystic areas is seen in the pancreatic head which measures 2.3 x 2.2 cm on image 47/17. A cystic lesion is also seen in the pancreatic head and neck which measures 2.7 by 2.5 cm on image 21/4. These findings are suspicious for pancreatic carcinoma. Mild pancreatic ductal dilatation noted in the pancreatic body and tail. Spleen:  Within normal limits in size and appearance. Adrenals/Urinary Tract: No masses identified. No evidence of hydronephrosis. Stomach/Bowel: Visualized portion unremarkable. Vascular/Lymphatic: 11 mm peripancreatic lymph node is seen in the porta hepatis and 10 mm portacaval lymph node is also demonstrated (image 15/8). No other sites of lymphadenopathy identified within the abdomen. No abdominal aortic aneurysm. Other:  None. Musculoskeletal:  No suspicious bone lesions identified. IMPRESSION: New diffuse biliary ductal dilatation, with abrupt stricture of the distal common bile duct in the pancreatic  head. 3 cm irregular predominantly solid lesion with cystic areas in the pancreatic head, highly suspicious for pancreatic carcinoma. Consider endoscopic ultrasound for further evaluation. Mild lymphadenopathy in the porta hepatis and portacaval region, suspicious for metastatic disease. Cholelithiasis and mild diffuse gallbladder wall thickening. Although this may be due to biliary obstruction, acute cholecystitis cannot definitely be excluded. Electronically Signed   By: Marlaine Hind M.D.   On: 11/14/2019 17:58               LOS: 2 days   Taheera Thomann  [facility]   Pager on [email or web address]. If 7PM-7AM, please contact night-coverage at [email or web address]     11/16/2019, 2:54 PM

## 2019-11-16 NOTE — Anesthesia Procedure Notes (Signed)
Procedure Name: Intubation Performed by: Kelton Pillar, CRNA Pre-anesthesia Checklist: Patient identified, Emergency Drugs available, Suction available and Patient being monitored Patient Re-evaluated:Patient Re-evaluated prior to induction Oxygen Delivery Method: Circle system utilized Preoxygenation: Pre-oxygenation with 100% oxygen Induction Type: IV induction Ventilation: Mask ventilation without difficulty Laryngoscope Size: 3 and McGraph Grade View: Grade I Tube type: Oral Tube size: 7.0 mm Number of attempts: 1 Airway Equipment and Method: Stylet and Oral airway Placement Confirmation: ETT inserted through vocal cords under direct vision,  positive ETCO2,  breath sounds checked- equal and bilateral and CO2 detector Secured at: 21 cm Tube secured with: Tape Dental Injury: Teeth and Oropharynx as per pre-operative assessment

## 2019-11-16 NOTE — Plan of Care (Addendum)
Pt Robin Daniels, VS stable, up independent. no complaints this morning. Pt went for ERCP. Complaints of pain this afternoon. MD made aware and prn oxycodone administered with good effect Will continue to monitor          Problem: Education: Goal: Knowledge of General Education information will improve Description: Including pain rating scale, medication(s)/side effects and non-pharmacologic comfort measures Outcome: Progressing   Problem: Health Behavior/Discharge Planning: Goal: Ability to manage health-related needs will improve Outcome: Progressing   Problem: Clinical Measurements: Goal: Ability to maintain clinical measurements within normal limits will improve Outcome: Progressing Goal: Will remain free from infection Outcome: Progressing Goal: Diagnostic test results will improve Outcome: Progressing

## 2019-11-16 NOTE — Addendum Note (Signed)
Addendum  created 11/16/19 1445 by Kelton Pillar, CRNA   Charge Capture section accepted

## 2019-11-16 NOTE — Anesthesia Postprocedure Evaluation (Signed)
Anesthesia Post Note  Patient: [name]  Procedure(s) Performed: ENDOSCOPIC RETROGRADE CHOLANGIOPANCREATOGRAPHY (ERCP) WITH PROPOFOL (N/A )  Patient location during evaluation: PACU Anesthesia Type: General Level of consciousness: awake and alert and oriented Pain management: pain level controlled Vital Signs Assessment: post-procedure vital signs reviewed and stable Respiratory status: spontaneous breathing, nonlabored ventilation and respiratory function stable Cardiovascular status: blood pressure returned to baseline and stable Postop Assessment: no signs of nausea or vomiting Anesthetic complications: no   No complications documented.   Last Vitals:  Vitals:   11/16/19 1324 11/16/19 1404  BP: (!) 176/72 (!) 161/79  Pulse: 84 87  Resp: 14 16  Temp: 36.7 C 36.8 C  SpO2: 97% 100%    Last Pain:  Vitals:   11/16/19 1404  TempSrc: Oral  PainSc:                  Emy Angevine

## 2019-11-17 LAB — COMPREHENSIVE METABOLIC PANEL
ALT: 387 U/L — ABNORMAL HIGH (ref 0–44)
AST: 324 U/L — ABNORMAL HIGH (ref 15–41)
Albumin: 3.2 g/dL — ABNORMAL LOW (ref 3.5–5.0)
Alkaline Phosphatase: 704 U/L — ABNORMAL HIGH (ref 38–126)
Anion gap: 12 (ref 5–15)
BUN: 12 mg/dL (ref 8–23)
CO2: 28 mmol/L (ref 22–32)
Calcium: 9.6 mg/dL (ref 8.9–10.3)
Chloride: 91 mmol/L — ABNORMAL LOW (ref 98–111)
Creatinine, Ser: 0.84 mg/dL (ref 0.44–1.00)
GFR calc Af Amer: >60 mL/min (ref >60)
GFR calc non Af Amer: >60 mL/min (ref >60)
Glucose, Bld: 173 mg/dL — ABNORMAL HIGH (ref 70–99)
Potassium: 3.7 mmol/L (ref 3.5–5.1)
Sodium: 131 mmol/L — ABNORMAL LOW (ref 135–145)
Total Bilirubin: 3.4 mg/dL — ABNORMAL HIGH (ref 0.3–1.2)
Total Protein: 7.7 g/dL (ref 6.5–8.1)

## 2019-11-17 MED ORDER — VITAMIN B-12 1000 MCG PO TABS: 1000.0000 ug | ORAL_TABLET | Freq: Every day | ORAL | Status: DC

## 2019-11-17 MED ORDER — ORAL CARE MOUTH RINSE
15.0000 mL | Freq: Two times a day (BID) | OROMUCOSAL | Status: DC
  Administered 2019-11-17: 15 mL via OROMUCOSAL

## 2019-11-17 MED ORDER — CHLORHEXIDINE GLUCONATE 0.12 % MT SOLN
15.0000 mL | Freq: Two times a day (BID) | OROMUCOSAL | Status: DC
  Administered 2019-11-17: 11:00:00 15 mL via OROMUCOSAL
  Filled 2019-11-17: qty 15

## 2019-11-17 NOTE — Discharge Summary (Signed)
Physician Discharge Summary  YASHIKA MASK HWE:[id number] DOB: March 24, 1956 DOA: 11/14/2019  PCP: [place] date: 11/14/2019 Discharge date: 11/17/2019  Discharge disposition: Home   Recommendations for Outpatient Follow-Up:   Follow-up with PCP in 1 week Follow-up with oncologist at  [facility] as soon as possible Follow-up with gastroenterologist in 3 to 4 weeks  Discharge Diagnosis:   Principal Problem:   Choledocholithiasis Active Problems:   Hypokalemia   Gallstones   Elevated liver enzymes   Pancreatic mass   Common bile duct (CBD) stricture    Discharge Condition: Stable.  Diet recommendation:  Diet Order            Diet Heart Room service appropriate? Yes; Fluid consistency: Thin  Diet effective now           Diet - low sodium heart healthy                   Code Status: Full Code     Hospital Course:   Ms. MANHATTAN MCCUEN is a 64 y.o. female with medical history significant for hypertension, anemia, anxiety, arthritis, type 2 diabetes mellitus, obesity, presented to the emergency room today at the behest of her primary care physician.  She said she went to her PCP the day prior to admission for lab work.  She was called the following day to come to the hospital because of abnormal labs.      In the ED, labs were significant for elevated liver enzymes with AST 235, ALT 297, total bilirubin 5.5 and ALP 627.  Potassium was 2.6.  Abdominal ultrasound showed gallstones, dilated CBD and possible CBD stone versus obstructing mass lesion.  Gastroenterologist was consulted for further evaluation.  MRCP showed a pancreatic mass and distal CBD stricture.  She underwent ERCP and stent was placed to the CBD.  Blood work also revealed hypokalemia and vitamin B12 deficiency.  Hypokalemia was treated and she was started on vitamin B12 injection.  She will be discharged home on oral vitamin B12.  She was evaluated by the gastroenterologist on the day of  discharge.  No evidence of pancreatitis post ERCP.  She is deemed stable for discharge to home today.  She has been advised to follow-up with oncologist as soon as possible.  Discharge plan was discussed with the patient and her daughter at the bedside.  Addendum: Patient and daughter were notified about new over-the-counter prescription for vitamin B12 after she had left the hospital.  Her daughter said she would get it from the pharmacy.    Medical Consultants:    Gastroenterologist   Discharge Exam:    Vitals:   11/17/19 0005 11/17/19 0419 11/17/19 1042 11/17/19 1211  BP: (!) 163/82 (!) 172/80 (!) 186/85 (!) 176/76  Pulse: 90 78 67 74  Resp: 16 14 18 18   Temp: 98.8 F (37.1 C) 98.5 F (36.9 C) 98.1 F (36.7 C) 98 F (36.7 C)  TempSrc: Oral Oral Oral Oral  SpO2: 92% 92% 98% 97%  Weight:      Height:         GEN: NAD SKIN: No rash EYES: EOMI, icteric ENT: MMM CV: RRR PULM: CTA B ABD: soft, obese, NT, +BS CNS: AAO x 3, non focal EXT: No edema or tenderness   The results of significant diagnostics from this hospitalization (including imaging, microbiology, ancillary and laboratory) are listed below for reference.     Procedures and Diagnostic Studies:   MR 3D Recon  At Scanner  Result Date: 11/14/2019 CLINICAL DATA:  Cholelithiasis and biliary ductal dilatation on recent ultrasound. EXAM: MRI ABDOMEN WITHOUT AND WITH CONTRAST (INCLUDING MRCP) TECHNIQUE: Multiplanar multisequence MR imaging of the abdomen was performed both before and after the administration of intravenous contrast. Heavily T2-weighted images of the biliary and pancreatic ducts were obtained, and three-dimensional MRCP images were rendered by post processing. CONTRAST:  30mL GADAVIST GADOBUTROL 1 MMOL/ML IV SOLN COMPARISON:  Ultrasound on 11/14/2019 and noncontrast CT on 08/02/2018 FINDINGS: Lower chest: No acute findings. 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 Hepatobiliary: No  hepatic masses identified. Gallbladder is dilated and contains tiny gallstones. Mild diffuse gallbladder wall thickening is seen, without pericholecystic inflammatory changes or fluid. Diffuse biliary ductal dilatation is new since previous study. Abrupt stricture of the distal common bile duct is seen in the pancreatic head. Pancreas: An irregular lesion with both solid and cystic areas is seen in the pancreatic head which measures 2.3 x 2.2 cm on image 47/17. A cystic lesion is also seen in the pancreatic head and neck which measures 2.7 by 2.5 cm on image 21/4. These findings are suspicious for pancreatic carcinoma. Mild pancreatic ductal dilatation noted in the pancreatic body and tail. Spleen:  Within normal limits in size and appearance. Adrenals/Urinary Tract: No masses identified. No evidence of hydronephrosis. Stomach/Bowel: Visualized portion unremarkable. Vascular/Lymphatic: 11 mm peripancreatic lymph node is seen in the porta hepatis and 10 mm portacaval lymph node is also demonstrated (image 15/8). No other sites of lymphadenopathy identified within the abdomen. No abdominal aortic aneurysm. Other:  None. Musculoskeletal:  No suspicious bone lesions identified. IMPRESSION: New diffuse biliary ductal dilatation, with abrupt stricture of the distal common bile duct in the pancreatic head. 3 cm irregular predominantly solid lesion with cystic areas in the pancreatic head, highly suspicious for pancreatic carcinoma. Consider endoscopic ultrasound for further evaluation. Mild lymphadenopathy in the porta hepatis and portacaval region, suspicious for metastatic disease. Cholelithiasis and mild diffuse gallbladder wall thickening. Although this may be due to biliary obstruction, acute cholecystitis cannot definitely be excluded. Electronically Signed   By: Marlaine Hind M.D.   On: 11/14/2019 17:58   MR ABDOMEN MRCP W WO CONTAST  Result Date: 11/14/2019 CLINICAL DATA:  Cholelithiasis and biliary ductal  dilatation on recent ultrasound. EXAM: MRI ABDOMEN WITHOUT AND WITH CONTRAST (INCLUDING MRCP) TECHNIQUE: Multiplanar multisequence MR imaging of the abdomen was performed both before and after the administration of intravenous contrast. Heavily T2-weighted images of the biliary and pancreatic ducts were obtained, and three-dimensional MRCP images were rendered by post processing. CONTRAST:  64mL GADAVIST GADOBUTROL 1 MMOL/ML IV SOLN COMPARISON:  Ultrasound on 11/14/2019 and noncontrast CT on 08/02/2018 FINDINGS: Lower chest: No acute findings. 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 Hepatobiliary: No hepatic masses identified. Gallbladder is dilated and contains tiny gallstones. Mild diffuse gallbladder wall thickening is seen, without pericholecystic inflammatory changes or fluid. Diffuse biliary ductal dilatation is new since previous study. Abrupt stricture of the distal common bile duct is seen in the pancreatic head. Pancreas: An irregular lesion with both solid and cystic areas is seen in the pancreatic head which measures 2.3 x 2.2 cm on image 47/17. A cystic lesion is also seen in the pancreatic head and neck which measures 2.7 by 2.5 cm on image 21/4. These findings are suspicious for pancreatic carcinoma. Mild pancreatic ductal dilatation noted in the pancreatic body and tail. Spleen:  Within normal limits in size and appearance.  Adrenals/Urinary Tract: No masses identified. No evidence of hydronephrosis. Stomach/Bowel: Visualized portion unremarkable. Vascular/Lymphatic: 11 mm peripancreatic lymph node is seen in the porta hepatis and 10 mm portacaval lymph node is also demonstrated (image 15/8). No other sites of lymphadenopathy identified within the abdomen. No abdominal aortic aneurysm. Other:  None. Musculoskeletal:  No suspicious bone lesions identified. IMPRESSION: New diffuse biliary ductal dilatation, with abrupt stricture of the distal common bile duct in the pancreatic head.  3 cm irregular predominantly solid lesion with cystic areas in the pancreatic head, highly suspicious for pancreatic carcinoma. Consider endoscopic ultrasound for further evaluation. Mild lymphadenopathy in the porta hepatis and portacaval region, suspicious for metastatic disease. Cholelithiasis and mild diffuse gallbladder wall thickening. Although this may be due to biliary obstruction, acute cholecystitis cannot definitely be excluded. Electronically Signed   By: Marlaine Hind M.D.   On: 11/14/2019 17:58   US Abdomen Limited RUQ  Result Date: 11/14/2019 CLINICAL DATA:  Elevated LFT EXAM: ULTRASOUND ABDOMEN LIMITED RIGHT UPPER QUADRANT COMPARISON:  CT abdomen pelvis 08/02/2018 FINDINGS: Gallbladder: Multiple gallstones measuring up to 7 mm in diameter. Negative sonographic Murphy sign. No gallbladder wall thickening or pericholecystic fluid. Common bile duct: Diameter: 13 mm.  Common bile duct is significantly dilated. Liver: No focal lesion identified. Within normal limits in parenchymal echogenicity. Portal vein is patent on color Doppler imaging with normal direction of blood flow towards the liver. Other: Negative for ascites IMPRESSION: Cholelithiasis without evidence of acute cholecystitis Common bile duct dilated at 13 mm. Possible common duct stone versus obstructing mass lesion. Electronically Signed   By: Franchot Gallo M.D.   On: 11/14/2019 13:39     Labs:   Basic Metabolic Panel: Recent Labs  Lab 11/14/19 0918 11/14/19 0918 11/14/19 2201 11/14/19 2201 11/15/19 0537 11/15/19 0537 11/16/19 0631 11/17/19 0722  NA 136  --  136  --  136  --  135 131*  K 2.6*   < > 3.7   < > 3.5   < > 3.3* 3.7  CL 96*  --  98  --  96*  --  94* 91*  CO2 27  --  26  --  29  --  31 28  GLUCOSE 164*  --  138*  --  133*  --  122* 173*  BUN 6*  --  5*  --  <5*  --  9 12  CREATININE 0.83  --  0.78  --  0.64  --  0.91 0.84  CALCIUM 9.3  --  9.0  --  9.4  --  9.3 9.6  MG 2.0  --   --   --   --   --  2.1   --    < > = values in this interval not displayed.   GFR Estimated Creatinine Clearance: 71 mL/min (by C-G formula based on SCr of 0.84 mg/dL). Liver Function Tests: Recent Labs  Lab 11/14/19 0918 11/15/19 0537 11/16/19 0631 11/17/19 0722  AST 235* 242* 323* 324*  ALT 297* 279* 314* 387*  ALKPHOS 627* 654* 704* 704*  BILITOT 5.5* 6.1* 7.3* 3.4*  PROT 7.9 7.5 7.6 7.7  ALBUMIN 3.5 3.2* 3.2* 3.2*   Recent Labs  Lab 11/14/19 0928  LIPASE 22   No results for input(s): AMMONIA in the last 168 hours. Coagulation profile Recent Labs  Lab 11/15/19 0537  INR 1.0    CBC: Recent Labs  Lab 11/14/19 0918  WBC 6.5  NEUTROABS 4.5  HGB 11.0*  HCT  34.0*  MCV 71.3*  PLT 500*   Cardiac Enzymes: No results for input(s): CKTOTAL, CKMB, CKMBINDEX, TROPONINI in the last 168 hours. BNP: Invalid input(s): POCBNP CBG: Recent Labs  Lab 11/15/19 0128  GLUCAP 131*   D-Dimer No results for input(s): DDIMER in the last 72 hours. Hgb A1c No results for input(s): HGBA1C in the last 72 hours. Lipid Profile No results for input(s): CHOL, HDL, LDLCALC, TRIG, CHOLHDL, LDLDIRECT in the last 72 hours. Thyroid function studies No results for input(s): TSH, T4TOTAL, T3FREE, THYROIDAB in the last 72 hours.  Invalid input(s): FREET3 Anemia work up No results for input(s): VITAMINB12, FOLATE, FERRITIN, TIBC, IRON, RETICCTPCT in the last 72 hours. Microbiology Recent Results (from the past 240 hour(s))  SARS Coronavirus 2 by RT PCR (hospital order, performed in [facility] hospital lab) Nasopharyngeal Nasopharyngeal Swab     Status: None   Collection Time: 11/14/19  3:47 PM   Specimen: Nasopharyngeal Swab  Result Value Ref Range Status   SARS Coronavirus 2 NEGATIVE NEGATIVE Final    Comment: (NOTE) SARS-CoV-2 target nucleic acids are NOT DETECTED.  The SARS-CoV-2 RNA is generally detectable in upper and lower respiratory specimens during the acute phase of infection. The  lowest concentration of SARS-CoV-2 viral copies this assay can detect is 250 copies / mL. A negative result does not preclude SARS-CoV-2 infection and should not be used as the sole basis for treatment or other patient management decisions.  A negative result may occur with improper specimen collection / handling, submission of specimen other than nasopharyngeal swab, presence of viral mutation(s) within the areas targeted by this assay, and inadequate number of viral copies (<250 copies / mL). A negative result must be combined with clinical observations, patient history, and epidemiological information.  Fact Sheet for Patients:   StrictlyIdeas.no  Fact Sheet for Healthcare Providers: BankingDealers.co.za  This test is not yet approved or  cleared by the [place] FDA and has been authorized for detection and/or diagnosis of SARS-CoV-2 by FDA under an Emergency Use Authorization (EUA).  This EUA will remain in effect (meaning this test can be used) for the duration of the COVID-19 declaration under Section 564(b)(1) of the Act, 21 U.S.C. section 360bbb-3(b)(1), unless the authorization is terminated or revoked sooner.  Performed at [facility], [address] [place], [place] [address]      Discharge Instructions:   Discharge Instructions    Diet - low sodium heart healthy   Complete by: As directed    Discharge instructions   Complete by: As directed    Go to [facility] for [facility] dose of [facility] discharge as scheduled   Increase activity slowly   Complete by: As directed      Allergies as of 11/17/2019   No Known Allergies     Medication List    STOP taking these medications   atorvastatin 20 MG tablet Commonly known as: LIPITOR     TAKE these medications   lisinopril-hydrochlorothiazide 20-25 MG tablet Commonly known as: ZESTORETIC Take 1 tablet by mouth daily.   metoprolol succinate 50 MG 24 hr  tablet Commonly known as: TOPROL-XL Take 50 mg by mouth daily. Take with or immediately following a meal.   Potassium Chloride ER 20 MEQ Tbcr Take 20 mEq by mouth daily.       Follow-up Information    [facility] Schedule an appointment as soon as possible for a visit in 4 day(s).   Contact information: [phone] [address] [place]       Lucilla Lame, MD Follow up.   Specialty: Gastroenterology Why: call next week for follow up appointment Contact information: [place]  [place] [address] [phone]                Time coordinating discharge: 32 minutes  Signed:  Jennye Boroughs  [facility] 11/17/2019, 3:01 PM   Pager on [email or web address]. If 7PM-7AM, please contact night-coverage at [email or web address]

## 2019-11-17 NOTE — Progress Notes (Signed)
Pt is being discharged home.  Discharge papers given and explained to pt.  Pt verbalized understanding.  Meds and follow up appointments reviewed.

## 2019-11-17 NOTE — Progress Notes (Signed)
Vonda Antigua, MD [address] [place], [place], [place], [address] [address], [place], [place], [place], [address] Phone: [phone]  Fax: [phone]   Subjective: Patient denies any abdominal pain.  No nausea or vomiting.  Tolerating oral diet.   Objective: Exam: Vital signs in last 24 hours: Vitals:   11/17/19 0005 11/17/19 0419 11/17/19 1042 11/17/19 1211  BP: (!) 163/82 (!) 172/80 (!) 186/85 (!) 176/76  Pulse: 90 78 67 74  Resp: 16 14 18 18   Temp: 98.8 F (37.1 C) 98.5 F (36.9 C) 98.1 F (36.7 C) 98 F (36.7 C)  TempSrc: Oral Oral Oral Oral  SpO2: 92% 92% 98% 97%  Weight:      Height:       Weight change:   Intake/Output Summary (Last 24 hours) at 11/17/2019 1454 Last data filed at 11/17/2019 1414 Gross per 24 hour  Intake 0 ml  Output --  Net 0 ml    General: No acute distress, AAO x3 Abd: Soft, NT/ND, No HSM Skin: Warm, no rashes Neck: Supple, Trachea midline   Lab Results: Lab Results  Component Value Date   WBC 6.5 11/14/2019   HGB 11.0 (L) 11/14/2019   HCT 34.0 (L) 11/14/2019   MCV 71.3 (L) 11/14/2019   PLT 500 (H) 11/14/2019   Micro Results: Recent Results (from the past 240 hour(s))  SARS Coronavirus 2 by RT PCR (hospital order, performed in [facility] hospital lab) Nasopharyngeal Nasopharyngeal Swab     Status: None   Collection Time: 11/14/19  3:47 PM   Specimen: Nasopharyngeal Swab  Result Value Ref Range Status   SARS Coronavirus 2 NEGATIVE NEGATIVE Final    Comment: (NOTE) SARS-CoV-2 target nucleic acids are NOT DETECTED.  The SARS-CoV-2 RNA is generally detectable in upper and lower respiratory specimens during the acute phase of infection. The lowest concentration of SARS-CoV-2 viral copies this assay can detect is 250 copies / mL. A negative result does not preclude SARS-CoV-2 infection and should not be used as the sole basis for treatment or other patient management decisions.  A negative result may occur  with improper specimen collection / handling, submission of specimen other than nasopharyngeal swab, presence of viral mutation(s) within the areas targeted by this assay, and inadequate number of viral copies (<250 copies / mL). A negative result must be combined with clinical observations, patient history, and epidemiological information.  Fact Sheet for Patients:   StrictlyIdeas.no  Fact Sheet for Healthcare Providers: BankingDealers.co.za  This test is not yet approved or  cleared by the [place] FDA and has been authorized for detection and/or diagnosis of SARS-CoV-2 by FDA under an Emergency Use Authorization (EUA).  This EUA will remain in effect (meaning this test can be used) for the duration of the COVID-19 declaration under Section 564(b)(1) of the Act, 21 U.S.C. section 360bbb-3(b)(1), unless the authorization is terminated or revoked sooner.  Performed at [facility], [address] [place], [place] [address]    Studies/Results: DG C-Arm 1-60 Min-No Report  Result Date: 11/16/2019 Fluoroscopy was utilized by the requesting physician.  No radiographic interpretation.   Medications:  Scheduled Meds: . atorvastatin  20 mg Oral Daily  . chlorhexidine  15 mL Mouth Rinse BID  . cyanocobalamin  1,000 mcg Intramuscular Daily  . enoxaparin (LOVENOX) injection  40 mg Subcutaneous Q24H  . hydrochlorothiazide  25 mg Oral Daily  . indomethacin  100 mg Rectal Once  . lisinopril  20 mg Oral Daily  .  mouth rinse  15 mL Mouth Rinse q12n4p  . metoprolol succinate  50 mg Oral Daily  . potassium chloride  20 mEq Oral Daily   Continuous Infusions: PRN Meds:.acetaminophen **OR** acetaminophen, morphine injection, ondansetron **OR** ondansetron (ZOFRAN) IV, oxyCODONE, pentafluoroprop-tetrafluoroeth, polyethylene glycol   Assessment: Principal Problem:   Choledocholithiasis Active Problems:   Hypokalemia    Gallstones   Elevated liver enzymes   Pancreatic mass   Common bile duct (CBD) stricture    Plan: No evidence of pancreatitis post ERCP yesterday Bilirubin has improved  If patient is discharged today, she should have close follow-up with oncology as an outpatient if inpatient referral is not placed  Patient will need repeat ERCP for stent removal by Dr. Verl Blalock in 3 months  Follow-up in GI clinic in 3-4 weeks as well.   EUS referral to be done at oncology or GI visit for tissue diagnosis   LOS: 3 days   Vonda Antigua, MD 11/17/2019, 2:54 PM

## 2019-11-19 ENCOUNTER — Encounter: Payer: Self-pay | Admitting: Gastroenterology

## 2019-11-21 ENCOUNTER — Other Ambulatory Visit: Payer: Self-pay

## 2019-11-21 ENCOUNTER — Encounter: Payer: Self-pay | Admitting: Internal Medicine

## 2019-11-21 ENCOUNTER — Inpatient Hospital Stay: Payer: Medicare Other | Attending: Internal Medicine | Admitting: Internal Medicine

## 2019-11-21 ENCOUNTER — Inpatient Hospital Stay: Payer: Medicare Other

## 2019-11-21 ENCOUNTER — Telehealth: Payer: Self-pay | Admitting: Internal Medicine

## 2019-11-21 ENCOUNTER — Other Ambulatory Visit: Payer: Self-pay | Admitting: Internal Medicine

## 2019-11-21 DIAGNOSIS — R590 Localized enlarged lymph nodes: Secondary | ICD-10-CM | POA: Insufficient documentation

## 2019-11-21 DIAGNOSIS — R634 Abnormal weight loss: Secondary | ICD-10-CM | POA: Diagnosis not present

## 2019-11-21 DIAGNOSIS — K8021 Calculus of gallbladder without cholecystitis with obstruction: Secondary | ICD-10-CM | POA: Diagnosis not present

## 2019-11-21 DIAGNOSIS — R7989 Other specified abnormal findings of blood chemistry: Secondary | ICD-10-CM

## 2019-11-21 DIAGNOSIS — D378 Neoplasm of uncertain behavior of other specified digestive organs: Secondary | ICD-10-CM

## 2019-11-21 DIAGNOSIS — E119 Type 2 diabetes mellitus without complications: Secondary | ICD-10-CM

## 2019-11-21 DIAGNOSIS — I10 Essential (primary) hypertension: Secondary | ICD-10-CM | POA: Insufficient documentation

## 2019-11-21 LAB — LACTATE DEHYDROGENASE: LDH: 143 U/L (ref 98–192)

## 2019-11-21 LAB — CBC WITH DIFFERENTIAL/PLATELET
Abs Immature Granulocytes: 0.03 10*3/uL (ref 0.00–0.07)
Basophils Absolute: 0 10*3/uL (ref 0.0–0.1)
Basophils Relative: 0 %
Eosinophils Absolute: 0.1 10*3/uL (ref 0.0–0.5)
Eosinophils Relative: 1 %
HCT: 32.9 % — ABNORMAL LOW (ref 36.0–46.0)
Hemoglobin: 10.9 g/dL — ABNORMAL LOW (ref 12.0–15.0)
Immature Granulocytes: 0 %
Lymphocytes Relative: 27 %
Lymphs Abs: 2.2 10*3/uL (ref 0.7–4.0)
MCH: 23.3 pg — ABNORMAL LOW (ref 26.0–34.0)
MCHC: 33.1 g/dL (ref 30.0–36.0)
MCV: 70.3 fL — ABNORMAL LOW (ref 80.0–100.0)
Monocytes Absolute: 0.6 10*3/uL (ref 0.1–1.0)
Monocytes Relative: 7 %
Neutro Abs: 5.4 10*3/uL (ref 1.7–7.7)
Neutrophils Relative %: 65 %
Platelets: 589 10*3/uL — ABNORMAL HIGH (ref 150–400)
RBC: 4.68 MIL/uL (ref 3.87–5.11)
RDW: 16.8 % — ABNORMAL HIGH (ref 11.5–15.5)
WBC: 8.4 10*3/uL (ref 4.0–10.5)
nRBC: 0 % (ref 0.0–0.2)

## 2019-11-21 LAB — COMPREHENSIVE METABOLIC PANEL
ALT: 137 U/L — ABNORMAL HIGH (ref 0–44)
AST: 45 U/L — ABNORMAL HIGH (ref 15–41)
Albumin: 3.4 g/dL — ABNORMAL LOW (ref 3.5–5.0)
Alkaline Phosphatase: 405 U/L — ABNORMAL HIGH (ref 38–126)
Anion gap: 12 (ref 5–15)
BUN: 15 mg/dL (ref 8–23)
CO2: 30 mmol/L (ref 22–32)
Calcium: 9 mg/dL (ref 8.9–10.3)
Chloride: 91 mmol/L — ABNORMAL LOW (ref 98–111)
Creatinine, Ser: 1.02 mg/dL — ABNORMAL HIGH (ref 0.44–1.00)
GFR calc Af Amer: >60 mL/min (ref >60)
GFR calc non Af Amer: 57 mL/min — ABNORMAL LOW (ref >60)
Glucose, Bld: 169 mg/dL — ABNORMAL HIGH (ref 70–99)
Potassium: 4.2 mmol/L (ref 3.5–5.1)
Sodium: 133 mmol/L — ABNORMAL LOW (ref 135–145)
Total Bilirubin: 1.8 mg/dL — ABNORMAL HIGH (ref 0.3–1.2)
Total Protein: 8.3 g/dL — ABNORMAL HIGH (ref 6.5–8.1)

## 2019-11-21 NOTE — Progress Notes (Signed)
[facility] NOTE  Patient Care Team: [place] as PCP - General [name], [place], [place] (Family Medicine) Rico Junker, RN as Registered Nurse Theodore Demark, RN as Registered Nurse Clent Jacks, RN as Oncology Nurse Navigator  CHIEF COMPLAINTS/PURPOSE OF CONSULTATION: pancreas mass  # pancreas head mass- 3 cm irregular predominantly solid lesion with cystic areas/regional lymphadenopathy 1 cm.Mild lymphadenopathy in the porta hepatis and portacaval region, suspicious for metastatic disease  #Biliary obstruction status post ERCP and stenting [Dr.Wohl]-likely secondary to above malignancy.  # Borderline DM; HTN   Oncology History   No history exists.     HISTORY OF PRESENTING ILLNESS:  Robin Daniels 64 y.o.  female with no significant past medical history except for hypertension and borderline diabetes is here for follow-up of pancreatic mass.  Patient states that she noticed to have dark-colored urine for the last few weeks-which led to further evaluation with her PCP that showed elevated LFTs.  Patient was emergently sent over to the hospital for further work-up.  Work-up in the hospital including MRCP showed pancreatic mass/biliary dilatation-for which she underwent ERCP with stenting.  Patient was discharged from the hospital about 4 days ago.  She is here to discuss further management/treatment options.  Appetite is good.  Mild weight loss of 5 pounds.  Otherwise no pain.  No fatigue.  Review of Systems  Constitutional: Positive for weight loss. Negative for chills, diaphoresis, fever and malaise/fatigue.  HENT: Negative for nosebleeds and sore throat.   Eyes: Negative for double vision.  Respiratory: Negative for cough, hemoptysis, sputum production, shortness of breath and wheezing.   Cardiovascular: Negative for chest pain, palpitations, orthopnea and leg swelling.  Gastrointestinal: Negative for abdominal pain, blood in  stool, constipation, diarrhea, heartburn, melena, nausea and vomiting.  Genitourinary: Negative for dysuria, frequency and urgency.  Musculoskeletal: Negative for back pain and joint pain.  Skin: Negative.  Negative for itching and rash.  Neurological: Negative for dizziness, tingling, focal weakness, weakness and headaches.  Endo/Heme/Allergies: Does not bruise/bleed easily.  Psychiatric/Behavioral: Negative for depression. The patient is not nervous/anxious and does not have insomnia.      MEDICAL HISTORY:  Past Medical History:  Diagnosis Date  . Anemia    history of  . Anxiety 09/11/2014  . Arthritis   . Diabetes mellitus without complication ([place])   . Fluttering heart   . Hyperlipidemia   . Hypertension     SURGICAL HISTORY: Past Surgical History:  Procedure Laterality Date  . ENDOSCOPIC RETROGRADE CHOLANGIOPANCREATOGRAPHY (ERCP) WITH PROPOFOL N/A 11/16/2019   Procedure: ENDOSCOPIC RETROGRADE CHOLANGIOPANCREATOGRAPHY (ERCP) WITH PROPOFOL;  Surgeon: Lucilla Lame, MD;  Location: [facility] ENDOSCOPY;  Service: Endoscopy;  Laterality: N/A;  . right knee replacement Right [id number]  . SHOULDER ARTHROSCOPY W/ ROTATOR CUFF REPAIR Right     SOCIAL HISTORY: Social History   Socioeconomic History  . Marital status: Divorced    Spouse name: Not on file  . Number of children: Not on file  . Years of education: Not on file  . Highest education level: Not on file  Occupational History  . Not on file  Tobacco Use  . Smoking status: Never Smoker  . Smokeless tobacco: Never Used  Vaping Use  . Vaping Use: Never used  Substance and Sexual Activity  . Alcohol use: No    Alcohol/week: 0.0 standard drinks  . Drug use: No  . Sexual activity: Never  Other Topics Concern  . Not on file  Social  History Narrative   Lives in [place]; with mom and son; worked in dietary; never smoked; no alcohol.    Social Determinants of Health   Financial Resource Strain:   . Difficulty of Paying  Living Expenses: Not on file  Food Insecurity:   . Worried About Charity fundraiser in the Last Year: Not on file  . Ran Out of Food in the Last Year: Not on file  Transportation Needs:   . Lack of Transportation (Medical): Not on file  . Lack of Transportation (Non-Medical): Not on file  Physical Activity:   . Days of Exercise per Week: Not on file  . Minutes of Exercise per Session: Not on file  Stress:   . Feeling of Stress : Not on file  Social Connections:   . Frequency of Communication with Friends and Family: Not on file  . Frequency of Social Gatherings with Friends and Family: Not on file  . Attends Religious Services: Not on file  . Active Member of Clubs or Organizations: Not on file  . Attends Archivist Meetings: Not on file  . Marital Status: Not on file  Intimate Partner Violence:   . Fear of Current or Ex-Partner: Not on file  . Emotionally Abused: Not on file  . Physically Abused: Not on file  . Sexually Abused: Not on file    FAMILY HISTORY: Family History  Problem Relation Age of Onset  . Diabetes Mother   . Hyperlipidemia Mother   . Hypertension Mother   . Vision loss Mother   . Arthritis Father   . Hyperlipidemia Father   . Hypertension Father   . Breast cancer Maternal Aunt     ALLERGIES:  has No Known Allergies.  MEDICATIONS:  Current Outpatient Medications  Medication Sig Dispense Refill  . lisinopril-hydrochlorothiazide (ZESTORETIC) 20-25 MG tablet Take 1 tablet by mouth daily.    . metoprolol succinate (TOPROL-XL) 50 MG 24 hr tablet Take 50 mg by mouth daily. Take with or immediately following a meal.    . Potassium Chloride ER 20 MEQ TBCR Take 20 mEq by mouth daily.     . vitamin B-12 (CYANOCOBALAMIN) 1000 MCG tablet Take 1 tablet (1,000 mcg total) by mouth daily.     No current facility-administered medications for this visit.      Marland Kitchen  PHYSICAL EXAMINATION: ECOG PERFORMANCE STATUS: 1 - Symptomatic but completely  ambulatory  Vitals:   11/21/19 1352  BP: 134/88  Pulse: (!) 101  Resp: 16  Temp: 97.6 F (36.4 C)  SpO2: 100%   Filed Weights   11/21/19 1352  Weight: 194 lb (88 kg)    Physical Exam HENT:     Head: Normocephalic and atraumatic.     Mouth/Throat:     Pharynx: No oropharyngeal exudate.  Eyes:     Pupils: Pupils are equal, round, and reactive to light.  Cardiovascular:     Rate and Rhythm: Normal rate and regular rhythm.  Pulmonary:     Effort: Pulmonary effort is normal. No respiratory distress.     Breath sounds: Normal breath sounds. No wheezing.  Abdominal:     General: Bowel sounds are normal. There is no distension.     Palpations: Abdomen is soft. There is no mass.     Tenderness: There is no abdominal tenderness. There is no guarding or rebound.  Musculoskeletal:        General: No tenderness. Normal range of motion.     Cervical back: Normal range  of motion and neck supple.  Skin:    General: Skin is warm.  Neurological:     Mental Status: She is alert and oriented to person, place, and time.  Psychiatric:        Mood and Affect: Affect normal.      LABORATORY DATA:  I have reviewed the data as listed Lab Results  Component Value Date   WBC 8.4 11/21/2019   HGB 10.9 (L) 11/21/2019   HCT 32.9 (L) 11/21/2019   MCV 70.3 (L) 11/21/2019   PLT 589 (H) 11/21/2019   Recent Labs    11/16/19 0631 11/17/19 0722 11/21/19 1449  NA 135 131* 133*  K 3.3* 3.7 4.2  CL 94* 91* 91*  CO2 31 28 30   GLUCOSE 122* 173* 169*  BUN 9 12 15   CREATININE 0.91 0.84 1.02*  CALCIUM 9.3 9.6 9.0  GFRNONAA >60 >60 57*  GFRAA >60 >60 >60  PROT 7.6 7.7 8.3*  ALBUMIN 3.2* 3.2* 3.4*  AST 323* 324* 45*  ALT 314* 387* 137*  ALKPHOS 704* 704* 405*  BILITOT 7.3* 3.4* 1.8*    RADIOGRAPHIC STUDIES: I have personally reviewed the radiological images as listed and agreed with the findings in the report. MR 3D Recon At Scanner  Result Date: 11/14/2019 CLINICAL DATA:   Cholelithiasis and biliary ductal dilatation on recent ultrasound. EXAM: MRI ABDOMEN WITHOUT AND WITH CONTRAST (INCLUDING MRCP) TECHNIQUE: Multiplanar multisequence MR imaging of the abdomen was performed both before and after the administration of intravenous contrast. Heavily T2-weighted images of the biliary and pancreatic ducts were obtained, and three-dimensional MRCP images were rendered by post processing. CONTRAST:  77mL GADAVIST GADOBUTROL 1 MMOL/ML IV SOLN COMPARISON:  Ultrasound on 11/14/2019 and noncontrast CT on 08/02/2018 FINDINGS: Lower chest: No acute findings. 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 Hepatobiliary: No hepatic masses identified. Gallbladder is dilated and contains tiny gallstones. Mild diffuse gallbladder wall thickening is seen, without pericholecystic inflammatory changes or fluid. Diffuse biliary ductal dilatation is new since previous study. Abrupt stricture of the distal common bile duct is seen in the pancreatic head. Pancreas: An irregular lesion with both solid and cystic areas is seen in the pancreatic head which measures 2.3 x 2.2 cm on image 47/17. A cystic lesion is also seen in the pancreatic head and neck which measures 2.7 by 2.5 cm on image 21/4. These findings are suspicious for pancreatic carcinoma. Mild pancreatic ductal dilatation noted in the pancreatic body and tail. Spleen:  Within normal limits in size and appearance. Adrenals/Urinary Tract: No masses identified. No evidence of hydronephrosis. Stomach/Bowel: Visualized portion unremarkable. Vascular/Lymphatic: 11 mm peripancreatic lymph node is seen in the porta hepatis and 10 mm portacaval lymph node is also demonstrated (image 15/8). No other sites of lymphadenopathy identified within the abdomen. No abdominal aortic aneurysm. Other:  None. Musculoskeletal:  No suspicious bone lesions identified. IMPRESSION: New diffuse biliary ductal dilatation, with abrupt stricture of the distal common  bile duct in the pancreatic head. 3 cm irregular predominantly solid lesion with cystic areas in the pancreatic head, highly suspicious for pancreatic carcinoma. Consider endoscopic ultrasound for further evaluation. Mild lymphadenopathy in the porta hepatis and portacaval region, suspicious for metastatic disease. Cholelithiasis and mild diffuse gallbladder wall thickening. Although this may be due to biliary obstruction, acute cholecystitis cannot definitely be excluded. Electronically Signed   By: Marlaine Hind M.D.   On: 11/14/2019 17:58   DG C-Arm 1-60 Min-No Report  Result Date: 11/16/2019 Fluoroscopy was  utilized by the requesting physician.  No radiographic interpretation.   MR ABDOMEN MRCP W WO CONTAST  Result Date: 11/14/2019 CLINICAL DATA:  Cholelithiasis and biliary ductal dilatation on recent ultrasound. EXAM: MRI ABDOMEN WITHOUT AND WITH CONTRAST (INCLUDING MRCP) TECHNIQUE: Multiplanar multisequence MR imaging of the abdomen was performed both before and after the administration of intravenous contrast. Heavily T2-weighted images of the biliary and pancreatic ducts were obtained, and three-dimensional MRCP images were rendered by post processing. CONTRAST:  22mL GADAVIST GADOBUTROL 1 MMOL/ML IV SOLN COMPARISON:  Ultrasound on 11/14/2019 and noncontrast CT on 08/02/2018 FINDINGS: Lower chest: No acute findings. 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 Hepatobiliary: No hepatic masses identified. Gallbladder is dilated and contains tiny gallstones. Mild diffuse gallbladder wall thickening is seen, without pericholecystic inflammatory changes or fluid. Diffuse biliary ductal dilatation is new since previous study. Abrupt stricture of the distal common bile duct is seen in the pancreatic head. Pancreas: An irregular lesion with both solid and cystic areas is seen in the pancreatic head which measures 2.3 x 2.2 cm on image 47/17. A cystic lesion is also seen in the pancreatic head  and neck which measures 2.7 by 2.5 cm on image 21/4. These findings are suspicious for pancreatic carcinoma. Mild pancreatic ductal dilatation noted in the pancreatic body and tail. Spleen:  Within normal limits in size and appearance. Adrenals/Urinary Tract: No masses identified. No evidence of hydronephrosis. Stomach/Bowel: Visualized portion unremarkable. Vascular/Lymphatic: 11 mm peripancreatic lymph node is seen in the porta hepatis and 10 mm portacaval lymph node is also demonstrated (image 15/8). No other sites of lymphadenopathy identified within the abdomen. No abdominal aortic aneurysm. Other:  None. Musculoskeletal:  No suspicious bone lesions identified. IMPRESSION: New diffuse biliary ductal dilatation, with abrupt stricture of the distal common bile duct in the pancreatic head. 3 cm irregular predominantly solid lesion with cystic areas in the pancreatic head, highly suspicious for pancreatic carcinoma. Consider endoscopic ultrasound for further evaluation. Mild lymphadenopathy in the porta hepatis and portacaval region, suspicious for metastatic disease. Cholelithiasis and mild diffuse gallbladder wall thickening. Although this may be due to biliary obstruction, acute cholecystitis cannot definitely be excluded. Electronically Signed   By: Marlaine Hind M.D.   On: 11/14/2019 17:58   US Abdomen Limited RUQ  Result Date: 11/14/2019 CLINICAL DATA:  Elevated LFT EXAM: ULTRASOUND ABDOMEN LIMITED RIGHT UPPER QUADRANT COMPARISON:  CT abdomen pelvis 08/02/2018 FINDINGS: Gallbladder: Multiple gallstones measuring up to 7 mm in diameter. Negative sonographic Murphy sign. No gallbladder wall thickening or pericholecystic fluid. Common bile duct: Diameter: 13 mm.  Common bile duct is significantly dilated. Liver: No focal lesion identified. Within normal limits in parenchymal echogenicity. Portal vein is patent on color Doppler imaging with normal direction of blood flow towards the liver. Other: Negative for  ascites IMPRESSION: Cholelithiasis without evidence of acute cholecystitis Common bile duct dilated at 13 mm. Possible common duct stone versus obstructing mass lesion. Electronically Signed   By: Franchot Gallo M.D.   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  Highly suspicious for malignancy-like adenocarcinoma.   #Biliary obstruction status post ERCP and stenting-likely secondary to above malignancy.  Repeat LFTs today.  Clinically improving.  #A long discussion the patient regarding the concerns for malignancy until proven otherwise.  Recommend work-up with PET scan; also EUS/biopsy for tissue diagnosis.  Patient  seemed overwhelmed; and I asked her to inform us the extent of the work-up that she is interested.  She will let us know.  Also discussed with nurse navigator Roderic Ovens; recommend reaching out to patient/discussion regarding biopsy.  Thank you, for allowing me to participate in the care of your pleasant patient. Please do not hesitate to contact me with questions or concerns in the interim.  # DISPOSITION: [phone]- home # labs today- cbc/cmp/LDH-ca-19-9- please order # follow up TBD-Dr.B  All questions were answered. The patient knows to call the clinic with any problems, questions or concerns.    Cammie Sickle, MD 11/21/2019 4:23 PM

## 2019-11-21 NOTE — Assessment & Plan Note (Addendum)
#   pancreas head mass-approximately 3 cm in size; regional lymphadenopathy 1 cm.  Highly suspicious for malignancy-like adenocarcinoma.   #Biliary obstruction status post ERCP and stenting-likely secondary to above malignancy.  Repeat LFTs today.  Clinically improving.  #A long discussion the patient regarding the concerns for malignancy until proven otherwise.  Recommend work-up with PET scan; also EUS/biopsy for tissue diagnosis.  Patient seemed overwhelmed; and I asked her to inform us the extent of the work-up that she is interested.  She will let us know.  Also discussed with nurse navigator Roderic Ovens; recommend reaching out to patient/discussion regarding biopsy.  Thank you, for allowing me to participate in the care of your pleasant patient. Please do not hesitate to contact me with questions or concerns in the interim.  # DISPOSITION: [phone]- home # labs today- cbc/cmp/LDH-ca-19-9- please order # follow up TBD-Dr.B

## 2019-11-21 NOTE — Telephone Encounter (Signed)
Spoke to patient regarding her results of LFTs-improving bilirubin/AST ALT lagging behind.  Again reminded the patient to inform us regarding her decision regarding the PET scan; endoscopic ultrasound/biopsy planning.  Discussed that nurse navigator Alyse Low will reach out to the patient-to assess her interest in proceeding with EUS/Biopsy. Discussed with Drue Dun.

## 2019-11-21 NOTE — Progress Notes (Signed)
mdt  

## 2019-11-22 ENCOUNTER — Other Ambulatory Visit: Payer: Self-pay

## 2019-11-22 LAB — CANCER ANTIGEN 19-9: CA 19-9: 167 U/mL — ABNORMAL HIGH (ref 0–35)

## 2019-11-22 NOTE — Telephone Encounter (Signed)
Pet scan approved per Margreta Journey - Approved Auth# [id number] valid 11/22/19-01/06/20 [place], Please schedule pet scan

## 2019-11-22 NOTE — Telephone Encounter (Signed)
I have called and spoken with Robin Daniels. She is feeling less overwhelmed today and is ready to proceed with her plan of care. I have scheduled her EUS for 11/29/19 with Dr. Cephas Darby here at [facility]. We have went over the procedure and instructions. The instructions can be located under the letters tab. A copy of these instructions and mandatory covid testing have been mailed to her home address. I have provided my contact information for future needs. Per. Dr. Aletha Halim notes she will also need a PET scan. She is ready to proceed with this as well. I will notify his team that they can place that order and we can get that scheduled as well as follow up for biopsy/PET results.

## 2019-11-23 ENCOUNTER — Other Ambulatory Visit: Payer: Self-pay | Admitting: *Deleted

## 2019-11-23 ENCOUNTER — Other Ambulatory Visit: Payer: Self-pay | Admitting: Internal Medicine

## 2019-11-23 DIAGNOSIS — K8689 Other specified diseases of pancreas: Secondary | ICD-10-CM

## 2019-11-23 DIAGNOSIS — D378 Neoplasm of uncertain behavior of other specified digestive organs: Secondary | ICD-10-CM

## 2019-11-23 NOTE — Progress Notes (Signed)
Labs entered.

## 2019-11-23 NOTE — Addendum Note (Signed)
Addended by: Delice Bison E on: 11/23/2019 09:18 AM   Modules accepted: Orders

## 2019-11-23 NOTE — Progress Notes (Signed)
Pt in agreement with work/follow up-  C- schedule PET ASAP; follow up-1-2 day later; MD- labs-cbc/cmp/ca-19-9-Dr.B

## 2019-11-27 ENCOUNTER — Other Ambulatory Visit
Admission: RE | Admit: 2019-11-27 | Discharge: 2019-11-27 | Disposition: A | Discharge status: Home / Self Care | Payer: Medicare Other | Source: Ambulatory Visit | Attending: Gastroenterology | Admitting: Gastroenterology

## 2019-11-27 ENCOUNTER — Other Ambulatory Visit: Payer: Self-pay

## 2019-11-27 DIAGNOSIS — Z20822 Contact with and (suspected) exposure to covid-19: Secondary | ICD-10-CM | POA: Insufficient documentation

## 2019-11-27 DIAGNOSIS — Z01812 Encounter for preprocedural laboratory examination: Secondary | ICD-10-CM | POA: Insufficient documentation

## 2019-11-27 LAB — SARS CORONAVIRUS 2 (TAT 6-24 HRS): SARS Coronavirus 2: NEGATIVE

## 2019-11-28 ENCOUNTER — Other Ambulatory Visit: Payer: Self-pay

## 2019-11-28 ENCOUNTER — Encounter
Admission: RE | Admit: 2019-11-28 | Discharge: 2019-11-28 | Disposition: A | Discharge status: Home / Self Care | Payer: Medicare Other | Source: Ambulatory Visit | Attending: Internal Medicine | Admitting: Internal Medicine

## 2019-11-28 DIAGNOSIS — K8689 Other specified diseases of pancreas: Secondary | ICD-10-CM

## 2019-11-28 LAB — GLUCOSE, CAPILLARY: Glucose-Capillary: 138 mg/dL — ABNORMAL HIGH (ref 70–99)

## 2019-11-28 MED ORDER — FLUDEOXYGLUCOSE F - 18 (FDG) INJECTION
10.9600 | Freq: Once | INTRAVENOUS | Status: AC | PRN
  Administered 2019-11-28: 10.96 via INTRAVENOUS

## 2019-11-29 ENCOUNTER — Ambulatory Visit: Payer: Medicare Other | Admitting: Anesthesiology

## 2019-11-29 ENCOUNTER — Ambulatory Visit
Admission: RE | Admit: 2019-11-29 | Discharge: 2019-11-29 | Disposition: A | Discharge status: Home / Self Care | Payer: Medicare Other | Attending: Gastroenterology | Admitting: Gastroenterology

## 2019-11-29 ENCOUNTER — Encounter
Admission: RE | Disposition: A | Discharge status: Home / Self Care | Payer: Self-pay | Source: Home / Self Care | Attending: Gastroenterology

## 2019-11-29 ENCOUNTER — Encounter: Payer: Self-pay | Admitting: *Deleted

## 2019-11-29 ENCOUNTER — Other Ambulatory Visit: Payer: Self-pay

## 2019-11-29 DIAGNOSIS — K869 Disease of pancreas, unspecified: Secondary | ICD-10-CM | POA: Diagnosis present

## 2019-11-29 DIAGNOSIS — C250 Malignant neoplasm of head of pancreas: Secondary | ICD-10-CM | POA: Diagnosis not present

## 2019-11-29 DIAGNOSIS — Z21 Asymptomatic human immunodeficiency virus [HIV] infection status: Secondary | ICD-10-CM | POA: Diagnosis not present

## 2019-11-29 DIAGNOSIS — M1990 Unspecified osteoarthritis, unspecified site: Secondary | ICD-10-CM | POA: Diagnosis not present

## 2019-11-29 DIAGNOSIS — Z79899 Other long term (current) drug therapy: Secondary | ICD-10-CM | POA: Insufficient documentation

## 2019-11-29 DIAGNOSIS — I10 Essential (primary) hypertension: Secondary | ICD-10-CM | POA: Diagnosis not present

## 2019-11-29 DIAGNOSIS — E119 Type 2 diabetes mellitus without complications: Secondary | ICD-10-CM | POA: Diagnosis not present

## 2019-11-29 DIAGNOSIS — R590 Localized enlarged lymph nodes: Secondary | ICD-10-CM | POA: Diagnosis not present

## 2019-11-29 HISTORY — PX: EUS: SHX5427

## 2019-11-29 SURGERY — ULTRASOUND, UPPER GI TRACT, ENDOSCOPIC
Anesthesia: General

## 2019-11-29 MED ORDER — LIDOCAINE HCL (CARDIAC) PF 100 MG/5ML IV SOSY
PREFILLED_SYRINGE | INTRAVENOUS | Status: DC | PRN
  Administered 2019-11-29 (×2): 50 mg via INTRAVENOUS

## 2019-11-29 MED ORDER — ONDANSETRON HCL 4 MG/2ML IJ SOLN
INTRAMUSCULAR | Status: DC | PRN
  Administered 2019-11-29: 4 mg via INTRAVENOUS

## 2019-11-29 MED ORDER — ONDANSETRON HCL 4 MG/2ML IJ SOLN
INTRAMUSCULAR | Status: AC
  Filled 2019-11-29: qty 2

## 2019-11-29 MED ORDER — GLYCOPYRROLATE 0.2 MG/ML IJ SOLN
INTRAMUSCULAR | Status: DC | PRN
  Administered 2019-11-29: .2 mg via INTRAVENOUS

## 2019-11-29 MED ORDER — PROPOFOL 10 MG/ML IV BOLUS
INTRAVENOUS | Status: DC | PRN
  Administered 2019-11-29: 100 mg via INTRAVENOUS
  Administered 2019-11-29 (×2): 50 mg via INTRAVENOUS

## 2019-11-29 MED ORDER — PROPOFOL 500 MG/50ML IV EMUL
INTRAVENOUS | Status: DC | PRN
  Administered 2019-11-29: 150 ug/kg/min via INTRAVENOUS

## 2019-11-29 MED ORDER — SODIUM CHLORIDE 0.9 % IV SOLN
INTRAVENOUS | Status: DC
  Administered 2019-11-29: 1000 mL via INTRAVENOUS

## 2019-11-29 MED ORDER — MIDAZOLAM HCL 2 MG/2ML IJ SOLN
INTRAMUSCULAR | Status: DC | PRN
  Administered 2019-11-29: 2 mg via INTRAVENOUS

## 2019-11-29 MED ORDER — MIDAZOLAM HCL 2 MG/2ML IJ SOLN
INTRAMUSCULAR | Status: AC
  Filled 2019-11-29: qty 2

## 2019-11-29 NOTE — H&P (Signed)
PRE-PROCEDURE HISTORY AND PHYSICAL   Robin Daniels presents for her scheduled Procedure(s): FULL UPPER ENDOSCOPIC ULTRASOUND (EUS) RADIAL.  The indication for the procedure(s) is pancreas mass.  There have been no significant recent changes in the patient's medical status.  Past Medical History:  Diagnosis Date  . Anemia    history of  . Anxiety 09/11/2014  . Arthritis   . Diabetes mellitus without complication ([place])   . Fluttering heart   . Hyperlipidemia   . Hypertension     Past Surgical History:  Procedure Laterality Date  . ENDOSCOPIC RETROGRADE CHOLANGIOPANCREATOGRAPHY (ERCP) WITH PROPOFOL N/A 11/16/2019   Procedure: ENDOSCOPIC RETROGRADE CHOLANGIOPANCREATOGRAPHY (ERCP) WITH PROPOFOL;  Surgeon: Lucilla Lame, MD;  Location: [facility] ENDOSCOPY;  Service: Endoscopy;  Laterality: N/A;  . right knee replacement Right [id number]  . SHOULDER ARTHROSCOPY W/ ROTATOR CUFF REPAIR Right     Allergies No Known Allergies  Medications Potassium Chloride ER, lisinopril-hydrochlorothiazide, metoprolol succinate, and vitamin B-12  Physical Examination  Body mass index is 34.37 kg/m. BP (!) 162/97   Pulse (!) 115   Temp 98.1 F (36.7 C) (Temporal)   Resp 19   Ht 5\' 3"  (1.6 m)   Wt 88 kg   LMP  (LMP Unknown)   SpO2 100%   BMI 34.37 kg/m  General:   Alert,  pleasant and cooperative in NAD Head:  Normocephalic and atraumatic. Neck:  Supple; no masses or thyromegaly. Lungs:  No audible wheezes   Heart:  Regular rate and rhythm. Abdomen:  Soft, nontender and nondistended. Normal bowel sounds, without guarding, and without rebound.   Neurologic:  Alert and  oriented x4;  grossly normal neurologically.  ASSESSMENT AND PLAN  Robin Daniels has been evaluated and deemed appropriate to undergo the planned Procedure(s): FULL UPPER ENDOSCOPIC ULTRASOUND (EUS) RADIAL.

## 2019-11-29 NOTE — Op Note (Signed)
[facility] Patient Name: Robin Daniels Procedure Date: 11/29/2019 1:32 PM MRN: [id number] Account #: [id number] Date of Birth: Dec 22, 1955 Admit Type: Outpatient Age: 64 Room: [facility] ENDO ROOM 3 Gender: Female Note Status: Finalized Procedure:             Upper EUS Indications:           Suspected mass in pancreas on CT scan Patient Profile:       Refer to note in patient chart for documentation of                         history and physical. Providers:             Lenetta Quaker. Cephas Darby, MD Referring MD:          Charlaine Dalton (Referring MD), Gennette Pac                         (Referring MD) Medicines:             Monitored Anesthesia Care Complications:         No immediate complications. Procedure:             Pre-Anesthesia Assessment:                        - Monitored anesthesia care under the supervision of a                         CRNA was determined to be medically necessary for this                         procedure based on complex procedure (ERCP, EUS).                        After obtaining informed consent, the endoscope was                         passed under direct vision. Throughout the procedure,                         the patient's blood pressure, pulse, and oxygen                         saturations were monitored continuously. The was                         introduced through the mouth, and advanced to the                         duodenum for ultrasound examination from the                         esophagus, stomach and duodenum. Findings:      ENDOSCOPIC FINDING: :      The examined esophagus was endoscopically normal.      The entire examined stomach was endoscopically normal.      A previously placed plastic stent was seen at the major papilla.      ENDOSONOGRAPHIC FINDING: :      An irregular mass was identified in the pancreatic head. The mass was  hypoechoic. The mass measured 21.9 mm by 18.1 mm in maximal        cross-sectional diameter. The outer margins were irregular. There was       sonographic evidence suggesting invasion into the superior mesenteric       vein (manifested by abutment). An intact interface was seen between the       mass and the adjacent structures suggesting a lack of invasion. The       remainder of the pancreas was examined. The endosonographic appearance       of parenchyma and the upstream pancreatic duct indicated prominent SB vs       an elongated cyst within the head of the pancreas measuring 20.3 mm by       9.9 mm. The PD was mildly dilated in the tail and measured 2.7 mm in the       head, 3.7 mm in the neck, 1.8 mm in the body and 1.5 mm in the tail.       Fine needle biopsy was performed. Color Doppler imaging was utilized       prior to needle puncture to confirm a lack of significant vascular       structures within the needle path. Four passes were made with the 25       gauge SharkCore biopsy needle using a transduodenal approach. A visible       core of tissue was obtained. Preliminary cytologic examination and touch       preps were performed. The cellularity of the specimen was adequate.       Final cytology results are pending.      One stent was visualized endosonographically in the common bile duct.       Extension of the stent was noted in the common bile duct. The CHD       measured 8.4 mm and CBD measured 1.7 mm.      Two enlarged lymph nodes were visualized in the peripancreatic region       and porta hepatis region. The largest measured 10 mm by 5.8 mm in       maximal cross-sectional diameter. The nodes were triangular,       heterogenous and had well defined margins. They did not fulfill EUS       criteria for malignancy. An adequate window could not be found for       sampling.      There was no sign of significant endosonographic abnormality in the left       lobe of the liver. No masses were identified. Impression:            EGD  Impression:                        - Normal esophagus.                        - Normal stomach.                        - Plastic stent in the duodenum.                        EUS Impression:                        - A mass was identified in the pancreatic head.  Tissue                         was obtained from this exam, and results are pending.                         However, the endosonographic appearance is highly                         suspicious for adenocarcinoma. This was staged T2 Nx                         Mx by endosonographic criteria. The staging applies if                         malignancy is confirmed. Fine needle biopsy performed.                        - One stent was visualized endosonographically in the                         common bile duct.                        - Two enlarged lymph nodes were visualized in the                         peripancreatic region and porta hepatis region.                        - There was no evidence of significant pathology in                         the left lobe of the liver. Recommendation:        - Discharge patient to home (via wheelchair).                        - Await cytology results.                        - Return to referring physician as previously                         scheduled.                        - The findings and recommendations were discussed with                         the patient and their family. Procedure Code(s):     --- Professional ---                        [phone], Esophagogastroduodenoscopy, flexible,                         transoral; with transendoscopic ultrasound-guided                         intramural or transmural fine needle  aspiration/biopsy(s), (includes endoscopic ultrasound                         examination limited to the esophagus, stomach or                         duodenum, and adjacent structures) Diagnosis Code(s):     --- Professional ---                         K86.89, Other specified diseases of pancreas                        R59.0, Localized enlarged lymph nodes                        R93.3, Abnormal findings on diagnostic imaging of                         other parts of digestive tract CPT copyright 2019 [employer]. All rights reserved. The codes documented in this report are preliminary and upon coder review may  be revised to meet current compliance requirements. Attending Participation:      I personally performed the entire procedure. Dr. Lenetta Quaker. Cephas Darby, MD Lenetta Quaker. Cephas Darby, MD 11/29/2019 2:38:48 PM This report has been signed electronically. Number of Addenda: 0 Note Initiated On: 11/29/2019 1:32 PM Estimated Blood Loss:  Estimated blood loss was minimal.      [facility]

## 2019-11-29 NOTE — Transfer of Care (Signed)
Immediate Anesthesia Transfer of Care Note  Patient: Robin Daniels  Procedure(s) Performed: FULL UPPER ENDOSCOPIC ULTRASOUND (EUS) RADIAL (N/A )  Patient Location: Endoscopy Unit  Anesthesia Type:General  Level of Consciousness: drowsy and patient cooperative  Airway & Oxygen Therapy: Patient Spontanous Breathing and Patient connected to nasal cannula oxygen  Post-op Assessment: Report given to RN and Post -op Vital signs reviewed and stable  Post vital signs: Reviewed and stable  Last Vitals:  Vitals Value Taken Time  BP 146/76 11/29/19 1425  Temp 36.7 C 11/29/19 1424  Pulse 93 11/29/19 1426  Resp 18 11/29/19 1426  SpO2 97 % 11/29/19 1426  Vitals shown include unvalidated device data.  Last Pain:  Vitals:   11/29/19 1424  TempSrc: Temporal  PainSc: Asleep         Complications: No complications documented.

## 2019-11-29 NOTE — Anesthesia Postprocedure Evaluation (Signed)
Anesthesia Post Note  Patient: Robin Daniels  Procedure(s) Performed: FULL UPPER ENDOSCOPIC ULTRASOUND (EUS) RADIAL (N/A )  Patient location during evaluation: Endoscopy Anesthesia Type: General Level of consciousness: awake and alert Pain management: pain level controlled Vital Signs Assessment: post-procedure vital signs reviewed and stable Respiratory status: spontaneous breathing, nonlabored ventilation, respiratory function stable and patient connected to nasal cannula oxygen Cardiovascular status: blood pressure returned to baseline and stable Postop Assessment: no apparent nausea or vomiting Anesthetic complications: no   No complications documented.   Last Vitals:  Vitals:   11/29/19 1300 11/29/19 1424  BP: (!) 162/97 (!) 146/76  Pulse: (!) 115 95  Resp: 19 (!) 22  Temp: 36.7 C 36.7 C  SpO2: 100% 100%    Last Pain:  Vitals:   11/29/19 1424  TempSrc: Temporal  PainSc: Asleep                 Arita Miss

## 2019-11-29 NOTE — Brief Op Note (Signed)
FNA of pancreatic head mass sent with lab

## 2019-11-29 NOTE — Anesthesia Preprocedure Evaluation (Signed)
Anesthesia Evaluation  Patient identified by MRN, date of birth, ID band Patient awake    Reviewed: Allergy & Precautions, H&P , NPO status , Patient's Chart, lab work & pertinent test results, reviewed documented beta blocker date and time   History of Anesthesia Complications Negative for: history of anesthetic complications  Airway Mallampati: III  TM Distance: >3 FB Neck ROM: full    Dental  (+) Dental Advidsory Given, Teeth Intact, Missing, Poor Dentition   Pulmonary neg pulmonary ROS,    Pulmonary exam normal breath sounds clear to auscultation       Cardiovascular Exercise Tolerance: Good hypertension, (-) angina(-) Past MI and (-) Cardiac Stents Normal cardiovascular exam+ dysrhythmias (palpitations) (-) Valvular Problems/Murmurs Rhythm:regular Rate:Normal     Neuro/Psych PSYCHIATRIC DISORDERS Anxiety negative neurological ROS     GI/Hepatic negative GI ROS, Neg liver ROS,   Endo/Other  diabetes  Renal/GU negative Renal ROS  negative genitourinary   Musculoskeletal   Abdominal   Peds  Hematology  (+) HIV,   Anesthesia Other Findings Past Medical History: No date: Anemia     Comment:  history of 09/11/2014: Anxiety No date: Arthritis No date: Diabetes mellitus without complication (HCC) No date: Fluttering heart No date: Hyperlipidemia No date: Hypertension   Reproductive/Obstetrics negative OB ROS                             Anesthesia Physical  Anesthesia Plan  ASA: II  Anesthesia Plan: General   Post-op Pain Management:    Induction: Intravenous  PONV Risk Score and Plan: 3 and Ondansetron, Dexamethasone, Treatment may vary due to age or medical condition and Propofol infusion  Airway Management Planned: Natural Airway and Nasal Cannula  Additional Equipment: None  Intra-op Plan:   Post-operative Plan: Extubation in OR  Informed Consent: I have reviewed the  patients History and Physical, chart, labs and discussed the procedure including the risks, benefits and alternatives for the proposed anesthesia with the patient or authorized representative who has indicated his/her understanding and acceptance.     Dental Advisory Given  Plan Discussed with: Anesthesiologist, CRNA and Surgeon  Anesthesia Plan Comments: (Discussed risks of anesthesia with patient, including possibility of difficulty with spontaneous ventilation under anesthesia necessitating airway intervention, PONV, and rare risks such as cardiac or respiratory or neurological events. Patient understands.)        Anesthesia Quick Evaluation

## 2019-11-30 ENCOUNTER — Other Ambulatory Visit: Payer: Self-pay | Admitting: Anatomic Pathology & Clinical Pathology

## 2019-11-30 ENCOUNTER — Other Ambulatory Visit: Payer: Self-pay

## 2019-11-30 ENCOUNTER — Telehealth: Payer: Self-pay | Admitting: *Deleted

## 2019-11-30 ENCOUNTER — Encounter: Payer: Self-pay | Admitting: Gastroenterology

## 2019-11-30 ENCOUNTER — Inpatient Hospital Stay: Payer: Medicare Other

## 2019-11-30 ENCOUNTER — Inpatient Hospital Stay: Payer: Medicare Other | Admitting: Internal Medicine

## 2019-11-30 LAB — CYTOLOGY - NON PAP

## 2019-11-30 NOTE — Telephone Encounter (Signed)
Spoke with Dr. Rogue Bussing - md would like to see patient on Tuesday. Apt time given to patient.

## 2019-11-30 NOTE — Telephone Encounter (Signed)
Per patient, her car would not start and she is unable to keep today's appointments. Please advise on r/s patient.

## 2019-12-04 ENCOUNTER — Other Ambulatory Visit: Payer: Self-pay

## 2019-12-04 ENCOUNTER — Inpatient Hospital Stay (HOSPITAL_BASED_OUTPATIENT_CLINIC_OR_DEPARTMENT_OTHER): Payer: Medicare Other | Admitting: Internal Medicine

## 2019-12-04 ENCOUNTER — Inpatient Hospital Stay: Payer: Medicare Other | Attending: Internal Medicine

## 2019-12-04 ENCOUNTER — Encounter: Payer: Self-pay | Admitting: Internal Medicine

## 2019-12-04 DIAGNOSIS — C250 Malignant neoplasm of head of pancreas: Secondary | ICD-10-CM | POA: Insufficient documentation

## 2019-12-04 DIAGNOSIS — I10 Essential (primary) hypertension: Secondary | ICD-10-CM | POA: Diagnosis not present

## 2019-12-04 DIAGNOSIS — Z5111 Encounter for antineoplastic chemotherapy: Secondary | ICD-10-CM | POA: Insufficient documentation

## 2019-12-04 DIAGNOSIS — D378 Neoplasm of uncertain behavior of other specified digestive organs: Secondary | ICD-10-CM

## 2019-12-04 HISTORY — DX: Malignant neoplasm of head of pancreas: C25.0

## 2019-12-04 LAB — COMPREHENSIVE METABOLIC PANEL
ALT: 22 U/L (ref 0–44)
AST: 19 U/L (ref 15–41)
Albumin: 3.5 g/dL (ref 3.5–5.0)
Alkaline Phosphatase: 156 U/L — ABNORMAL HIGH (ref 38–126)
Anion gap: 8 (ref 5–15)
BUN: 7 mg/dL — ABNORMAL LOW (ref 8–23)
CO2: 26 mmol/L (ref 22–32)
Calcium: 8.7 mg/dL — ABNORMAL LOW (ref 8.9–10.3)
Chloride: 102 mmol/L (ref 98–111)
Creatinine, Ser: 0.9 mg/dL (ref 0.44–1.00)
GFR calc Af Amer: >60 mL/min (ref >60)
GFR calc non Af Amer: >60 mL/min (ref >60)
Glucose, Bld: 126 mg/dL — ABNORMAL HIGH (ref 70–99)
Potassium: 3.6 mmol/L (ref 3.5–5.1)
Sodium: 136 mmol/L (ref 135–145)
Total Bilirubin: 0.9 mg/dL (ref 0.3–1.2)
Total Protein: 7.3 g/dL (ref 6.5–8.1)

## 2019-12-04 LAB — CBC WITH DIFFERENTIAL/PLATELET
Abs Immature Granulocytes: 0.02 10*3/uL (ref 0.00–0.07)
Basophils Absolute: 0 10*3/uL (ref 0.0–0.1)
Basophils Relative: 1 %
Eosinophils Absolute: 0.1 10*3/uL (ref 0.0–0.5)
Eosinophils Relative: 1 %
HCT: 31.5 % — ABNORMAL LOW (ref 36.0–46.0)
Hemoglobin: 10.2 g/dL — ABNORMAL LOW (ref 12.0–15.0)
Immature Granulocytes: 0 %
Lymphocytes Relative: 33 %
Lymphs Abs: 1.9 10*3/uL (ref 0.7–4.0)
MCH: 23.3 pg — ABNORMAL LOW (ref 26.0–34.0)
MCHC: 32.4 g/dL (ref 30.0–36.0)
MCV: 71.9 fL — ABNORMAL LOW (ref 80.0–100.0)
Monocytes Absolute: 0.4 10*3/uL (ref 0.1–1.0)
Monocytes Relative: 8 %
Neutro Abs: 3.4 10*3/uL (ref 1.7–7.7)
Neutrophils Relative %: 57 %
Platelets: 418 10*3/uL — ABNORMAL HIGH (ref 150–400)
RBC: 4.38 MIL/uL (ref 3.87–5.11)
RDW: 16.5 % — ABNORMAL HIGH (ref 11.5–15.5)
WBC: 5.9 10*3/uL (ref 4.0–10.5)
nRBC: 0 % (ref 0.0–0.2)

## 2019-12-04 NOTE — Progress Notes (Signed)
START ON PATHWAY REGIMEN - Pancreatic Adenocarcinoma     A cycle is every 28 days:     Nab-paclitaxel (protein bound)      Gemcitabine   **Always confirm dose/schedule in your pharmacy ordering system**  Patient Characteristics: Preoperative (Clinical Staging), Borderline Resectable, PS = 0,1, BRCA1/2 and PALB2 Mutation Absent/Unknown Therapeutic Status: Preoperative (Clinical Staging) AJCC T Category: cT2 AJCC N Category: cN0 Resectability Status: Borderline Resectable AJCC M Category: cM0 AJCC 8 Stage Grouping: IB ECOG Performance Status: 1 BRCA1/2 Mutation Status: Quantity Not Sufficient PALB2 Mutation Status: Quantity Not Sufficient Intent of Therapy: Curative Intent, Discussed with Patient

## 2019-12-04 NOTE — Progress Notes (Signed)
Educated Ms. Hughett further on port placement and chemo education class. Chemo education class has been scheduled and reviewed. Went over all upcoming appointments and provided a copy in AVS. Spoke with [employer] [employer]. They will arrange appointment with Dr. Barry Dienes and notify our office with this date for our follow up. Invasive checklist faxed to scheduling for port placement.

## 2019-12-04 NOTE — Assessment & Plan Note (Addendum)
#    Pancreas adenocarcinoma-Stage IB-/boderlineresectable- [id number]-[mass 22x41mm; invading/abutting superior mesenteric vein; 2 enlarged lymph nodes peripancreatic/porta hepatis largest 10 x 5.8 mm-nonpathologic based on EUS criteria/no biopsy]-borderline resectable.  PET scan no evidence of distant metastatic disease.  #Would recommend evaluation with surgery-for consideration of resection.  We will make a referral to [place] [facility]; Dr. Barry Dienes.   #Given the abutment of the SMV-I think is reasonable to consider neoadjuvant chemotherapy.  I would recommend gemcitabine/Abraxane.  I am concerned about poor tolerance to FOLFIRINOX.  Discussed the potential side effects including but not limited to-increasing fatigue, nausea vomiting, diarrhea, hair loss, sores in the mouth, increase risk of infection and also neuropathy.   #Biliary obstruction status post ERCP and stenting-likely secondary to above malignancy; improving.  # # I reviewed the blood work- with the patient in detail; also reviewed the imaging independently [as summarized above]; and with the patient in detail.  Offered to speak to family patient declines.   # DISPOSITION: [phone]- home # chemo ed # port referral IR # Dr.[facility] # week of 20th- Gem-ab; MD- labs- chemo; labs- [email or web address]

## 2019-12-04 NOTE — Progress Notes (Signed)
[facility] NOTE  Patient Care Team: [facility] as PCP - General [name], [place], [place] (Family Medicine) Rico Junker, RN as Registered Nurse Theodore Demark, RN as Registered Nurse Clent Jacks, RN as Oncology Nurse Navigator  CHIEF COMPLAINTS/PURPOSE OF CONSULTATION: pancreas adenocarcinoma    Oncology History Overview Note  #Pancreas adenocarcinoma-Stage IB- uT2uNxuMx-[EUS- Dr.Spaete; [facility]/GI; mass 22x67mm; invading/abutting superior mesenteric vein; 2 enlarged lymph nodes peripancreatic/porta hepatis largest 10 x 5.8 mm-nonpathologic based on EUS criteria/no biopsy]-borderline resectable.  PET scan no evidence of distant metastatic disease.  #Biliary obstruction status post ERCP and stenting [Dr.Wohl]-  # Borderline DM; HTN   # SURVIVORSHIP:   # GENETICS:   DIAGNOSIS: Pancreatic cancer  STAGE:  IB/borderline resectable;  GOALS: cure  CURRENT/MOST RECENT THERAPY : Neoadjuvant therapy    Cancer of head of pancreas ([place])  12/04/2019 Initial Diagnosis   Cancer of head of pancreas ([place])   12/04/2019 -  Chemotherapy   The patient had PACLitaxel-protein bound (ABRAXANE) chemo infusion 250 mg, 125 mg/m2, Intravenous,  Once, 0 of 4 cycles gemcitabine (GEMZAR) 1,976 mg in sodium chloride 0.9 % 250 mL chemo infusion, 1,000 mg/m2, Intravenous,  Once, 0 of 4 cycles  for chemotherapy treatment.       HISTORY OF PRESENTING ILLNESS:  Robin Daniels 64 y.o.  female newly diagnosed pancreatic mass is here today with results of her endoscopic ultrasound/pathology PET scan.  Patient admits to continued weight loss.  There is no pain.  No nausea or vomiting.   Review of Systems  Constitutional: Positive for weight loss. Negative for chills, diaphoresis, fever and malaise/fatigue.  HENT: Negative for nosebleeds and sore throat.   Eyes: Negative for double vision.  Respiratory: Negative for cough, hemoptysis, sputum production, shortness of  breath and wheezing.   Cardiovascular: Negative for chest pain, palpitations, orthopnea and leg swelling.  Gastrointestinal: Negative for abdominal pain, blood in stool, constipation, diarrhea, heartburn, melena, nausea and vomiting.  Genitourinary: Negative for dysuria, frequency and urgency.  Musculoskeletal: Negative for back pain and joint pain.  Skin: Negative.  Negative for itching and rash.  Neurological: Negative for dizziness, tingling, focal weakness, weakness and headaches.  Endo/Heme/Allergies: Does not bruise/bleed easily.  Psychiatric/Behavioral: Negative for depression. The patient is not nervous/anxious and does not have insomnia.      MEDICAL HISTORY:  Past Medical History:  Diagnosis Date  . Anemia    history of  . Anxiety 09/11/2014  . Arthritis   . Cancer of head of pancreas ([place]) 12/04/2019  . Diabetes mellitus without complication ([place])   . Fluttering heart   . Hyperlipidemia   . Hypertension     SURGICAL HISTORY: Past Surgical History:  Procedure Laterality Date  . ENDOSCOPIC RETROGRADE CHOLANGIOPANCREATOGRAPHY (ERCP) WITH PROPOFOL N/A 11/16/2019   Procedure: ENDOSCOPIC RETROGRADE CHOLANGIOPANCREATOGRAPHY (ERCP) WITH PROPOFOL;  Surgeon: Lucilla Lame, MD;  Location: [facility] ENDOSCOPY;  Service: Endoscopy;  Laterality: N/A;  . EUS N/A 11/29/2019   Procedure: FULL UPPER ENDOSCOPIC ULTRASOUND (EUS) RADIAL;  Surgeon: Reita Cliche, MD;  Location: [facility] ENDOSCOPY;  Service: Gastroenterology;  Laterality: N/A;  . right knee replacement Right [id number]  . SHOULDER ARTHROSCOPY W/ ROTATOR CUFF REPAIR Right     SOCIAL HISTORY: Social History   Socioeconomic History  . Marital status: Divorced    Spouse name: Not on file  . Number of children: Not on file  . Years of education: Not on file  . Highest education level: Not on file  Occupational  History  . Not on file  Tobacco Use  . Smoking status: Never Smoker  . Smokeless tobacco: Never Used  Vaping Use  . Vaping  Use: Never used  Substance and Sexual Activity  . Alcohol use: No    Alcohol/week: 0.0 standard drinks  . Drug use: No  . Sexual activity: Never  Other Topics Concern  . Not on file  Social History Narrative   Lives in [place]; with mom and son; worked in dietary; never smoked; no alcohol.    Social Determinants of Health   Financial Resource Strain:   . Difficulty of Paying Living Expenses: Not on file  Food Insecurity:   . Worried About Charity fundraiser in the Last Year: Not on file  . Ran Out of Food in the Last Year: Not on file  Transportation Needs:   . Lack of Transportation (Medical): Not on file  . Lack of Transportation (Non-Medical): Not on file  Physical Activity:   . Days of Exercise per Week: Not on file  . Minutes of Exercise per Session: Not on file  Stress:   . Feeling of Stress : Not on file  Social Connections:   . Frequency of Communication with Friends and Family: Not on file  . Frequency of Social Gatherings with Friends and Family: Not on file  . Attends Religious Services: Not on file  . Active Member of Clubs or Organizations: Not on file  . Attends Archivist Meetings: Not on file  . Marital Status: Not on file  Intimate Partner Violence:   . Fear of Current or Ex-Partner: Not on file  . Emotionally Abused: Not on file  . Physically Abused: Not on file  . Sexually Abused: Not on file    FAMILY HISTORY: Family History  Problem Relation Age of Onset  . Diabetes Mother   . Hyperlipidemia Mother   . Hypertension Mother   . Vision loss Mother   . Arthritis Father   . Hyperlipidemia Father   . Hypertension Father   . Breast cancer Maternal Aunt     ALLERGIES:  has No Known Allergies.  MEDICATIONS:  Current Outpatient Medications  Medication Sig Dispense Refill  . lisinopril-hydrochlorothiazide (ZESTORETIC) 20-25 MG tablet Take 1 tablet by mouth daily.    . Potassium Chloride ER 20 MEQ TBCR Take 20 mEq by mouth daily.      . vitamin B-12 (CYANOCOBALAMIN) 1000 MCG tablet Take 1 tablet (1,000 mcg total) by mouth daily.    . metoprolol succinate (TOPROL-XL) 50 MG 24 hr tablet Take 50 mg by mouth daily. Take with or immediately following a meal. (Patient not taking: Reported on 11/29/2019)     No current facility-administered medications for this visit.      Marland Kitchen  PHYSICAL EXAMINATION: ECOG PERFORMANCE STATUS: 1 - Symptomatic but completely ambulatory  Vitals:   12/04/19 1400  BP: (!) 172/90  Pulse: 87  Resp: 16  Temp: (!) 96.4 F (35.8 C)  SpO2: 100%   Filed Weights   12/04/19 1400  Weight: 193 lb (87.5 kg)    Physical Exam HENT:     Head: Normocephalic and atraumatic.     Mouth/Throat:     Pharynx: No oropharyngeal exudate.  Eyes:     Pupils: Pupils are equal, round, and reactive to light.  Cardiovascular:     Rate and Rhythm: Normal rate and regular rhythm.  Pulmonary:     Effort: Pulmonary effort is normal. No respiratory distress.  Breath sounds: Normal breath sounds. No wheezing.  Abdominal:     General: Bowel sounds are normal. There is no distension.     Palpations: Abdomen is soft. There is no mass.     Tenderness: There is no abdominal tenderness. There is no guarding or rebound.  Musculoskeletal:        General: No tenderness. Normal range of motion.     Cervical back: Normal range of motion and neck supple.  Skin:    General: Skin is warm.  Neurological:     Mental Status: She is alert and oriented to person, place, and time.  Psychiatric:        Mood and Affect: Affect normal.      LABORATORY DATA:  I have reviewed the data as listed Lab Results  Component Value Date   WBC 5.9 12/04/2019   HGB 10.2 (L) 12/04/2019   HCT 31.5 (L) 12/04/2019   MCV 71.9 (L) 12/04/2019   PLT 418 (H) 12/04/2019   Recent Labs    11/17/19 0722 11/21/19 1449 12/04/19 1327  NA 131* 133* 136  K 3.7 4.2 3.6  CL 91* 91* 102  CO2 28 30 26   GLUCOSE 173* 169* 126*  BUN 12 15 7*   CREATININE 0.84 1.02* 0.90  CALCIUM 9.6 9.0 8.7*  GFRNONAA >60 57* >60  GFRAA >60 >60 >60  PROT 7.7 8.3* 7.3  ALBUMIN 3.2* 3.4* 3.5  AST 324* 45* 19  ALT 387* 137* 22  ALKPHOS 704* 405* 156*  BILITOT 3.4* 1.8* 0.9    RADIOGRAPHIC STUDIES: I have personally reviewed the radiological images as listed and agreed with the findings in the report. MR 3D Recon At Scanner  Result Date: 11/14/2019 CLINICAL DATA:  Cholelithiasis and biliary ductal dilatation on recent ultrasound. EXAM: MRI ABDOMEN WITHOUT AND WITH CONTRAST (INCLUDING MRCP) TECHNIQUE: Multiplanar multisequence MR imaging of the abdomen was performed both before and after the administration of intravenous contrast. Heavily T2-weighted images of the biliary and pancreatic ducts were obtained, and three-dimensional MRCP images were rendered by post processing. CONTRAST:  83mL GADAVIST GADOBUTROL 1 MMOL/ML IV SOLN COMPARISON:  Ultrasound on 11/14/2019 and noncontrast CT on 08/02/2018 FINDINGS: Lower chest: No acute findings. 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 Hepatobiliary: No hepatic masses identified. Gallbladder is dilated and contains tiny gallstones. Mild diffuse gallbladder wall thickening is seen, without pericholecystic inflammatory changes or fluid. Diffuse biliary ductal dilatation is new since previous study. Abrupt stricture of the distal common bile duct is seen in the pancreatic head. Pancreas: An irregular lesion with both solid and cystic areas is seen in the pancreatic head which measures 2.3 x 2.2 cm on image 47/17. A cystic lesion is also seen in the pancreatic head and neck which measures 2.7 by 2.5 cm on image 21/4. These findings are suspicious for pancreatic carcinoma. Mild pancreatic ductal dilatation noted in the pancreatic body and tail. Spleen:  Within normal limits in size and appearance. Adrenals/Urinary Tract: No masses identified. No evidence of hydronephrosis. Stomach/Bowel: Visualized  portion unremarkable. Vascular/Lymphatic: 11 mm peripancreatic lymph node is seen in the porta hepatis and 10 mm portacaval lymph node is also demonstrated (image 15/8). No other sites of lymphadenopathy identified within the abdomen. No abdominal aortic aneurysm. Other:  None. Musculoskeletal:  No suspicious bone lesions identified. IMPRESSION: New diffuse biliary ductal dilatation, with abrupt stricture of the distal common bile duct in the pancreatic head. 3 cm irregular predominantly solid lesion with cystic areas in the  pancreatic head, highly suspicious for pancreatic carcinoma. Consider endoscopic ultrasound for further evaluation. Mild lymphadenopathy in the porta hepatis and portacaval region, suspicious for metastatic disease. Cholelithiasis and mild diffuse gallbladder wall thickening. Although this may be due to biliary obstruction, acute cholecystitis cannot definitely be excluded. Electronically Signed   By: Marlaine Hind M.D.   On: 11/14/2019 17:58   NM PET Image Initial (PI) Skull Base To Thigh  Result Date: 11/28/2019 CLINICAL DATA:  Initial treatment strategy for pancreatic mass. EXAM: NUCLEAR MEDICINE PET SKULL BASE TO THIGH TECHNIQUE: 10.96 mCi F-18 FDG was injected intravenously. Full-ring PET imaging was performed from the skull base to thigh after the radiotracer. CT data was obtained and used for attenuation correction and anatomic localization. Fasting blood glucose: 138 mg/dl COMPARISON:  MRI 11/14/2019 FINDINGS: Mediastinal blood pool activity: SUV max 3.24 Liver activity: SUV max NA NECK: Mild asymmetric increased radiotracer uptake within the right tonsillar region is identified when compared with the left. SUV max in the right tonsillar region is equal to 7.966 versus 5.91 on the contralateral side. Additionally, there is a right level 2 lymph node which exhibits an SUV max of 5.01. Incidental CT findings: none CHEST: No FDG avid supraclavicular, axillary, mediastinal or hilar lymph  nodes. No FDG avid pulmonary nodule or mass identified. There is a large fat attenuation mass overlying the posterior and lateral right lower lobe. 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 Incidental CT findings: Aortic atherosclerosis. Coronary artery calcifications. ABDOMEN/PELVIS: No focal liver abnormality identified. Pneumobilia is identified status post common bile duct stent. Mild increased radiotracer uptake localizes to the head of pancreas surrounding the common bile duct stent with SUV max of 5.39. No FDG avid foci within the spleen or adrenal glands. No FDG avid abdominopelvic lymph nodes. The cystic lesions within head of pancreas are better seen on recent MRI. Incidental CT findings: Mild aortic atherosclerosis. Gallstones identified. SKELETON: No focal hypermetabolic activity to suggest skeletal metastasis. Incidental CT findings: none IMPRESSION: 1. Mild radiotracer uptake localizes to the head of pancreas in the area surrounding the common bile duct stent. Cannot rule out underlying pancreatic neoplasm. Consider further evaluation with endoscopic ultrasound with tissue sampling if indicated. 2. No signs of FDG avid liver metastasis or upper abdominal nodal metastasis. 3. There is asymmetric increased uptake within the right tonsillar region with a mildly FDG avid right level 2 lymph node. Findings may be related to upper respiratory tract viral infection. Underlying head neck neoplasm would be difficult to exclude. Correlate for any clinical signs or symptoms of recent upper respiratory tract viral infection. If clinically indicated contrast enhanced CT or MRI of the neck and or direct visualization is advised. Electronically Signed   By: Kerby Moors M.D.   On: 11/28/2019 14:04   DG C-Arm 1-60 Min-No Report  Result Date: 11/16/2019 Fluoroscopy was utilized by the requesting physician.  No radiographic interpretation.   MR  ABDOMEN MRCP W WO CONTAST  Result Date: 11/14/2019 CLINICAL DATA:  Cholelithiasis and biliary ductal dilatation on recent ultrasound. EXAM: MRI ABDOMEN WITHOUT AND WITH CONTRAST (INCLUDING MRCP) TECHNIQUE: Multiplanar multisequence MR imaging of the abdomen was performed both before and after the administration of intravenous contrast. Heavily T2-weighted images of the biliary and pancreatic ducts were obtained, and three-dimensional MRCP images were rendered by post processing. CONTRAST:  54mL GADAVIST GADOBUTROL 1 MMOL/ML IV SOLN COMPARISON:  Ultrasound on 11/14/2019 and noncontrast CT on 08/02/2018 FINDINGS: Lower  chest: No acute findings. 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 Hepatobiliary: No hepatic masses identified. Gallbladder is dilated and contains tiny gallstones. Mild diffuse gallbladder wall thickening is seen, without pericholecystic inflammatory changes or fluid. Diffuse biliary ductal dilatation is new since previous study. Abrupt stricture of the distal common bile duct is seen in the pancreatic head. Pancreas: An irregular lesion with both solid and cystic areas is seen in the pancreatic head which measures 2.3 x 2.2 cm on image 47/17. A cystic lesion is also seen in the pancreatic head and neck which measures 2.7 by 2.5 cm on image 21/4. These findings are suspicious for pancreatic carcinoma. Mild pancreatic ductal dilatation noted in the pancreatic body and tail. Spleen:  Within normal limits in size and appearance. Adrenals/Urinary Tract: No masses identified. No evidence of hydronephrosis. Stomach/Bowel: Visualized portion unremarkable. Vascular/Lymphatic: 11 mm peripancreatic lymph node is seen in the porta hepatis and 10 mm portacaval lymph node is also demonstrated (image 15/8). No other sites of lymphadenopathy identified within the abdomen. No abdominal aortic aneurysm. Other:  None. Musculoskeletal:  No suspicious bone lesions identified. IMPRESSION: New diffuse  biliary ductal dilatation, with abrupt stricture of the distal common bile duct in the pancreatic head. 3 cm irregular predominantly solid lesion with cystic areas in the pancreatic head, highly suspicious for pancreatic carcinoma. Consider endoscopic ultrasound for further evaluation. Mild lymphadenopathy in the porta hepatis and portacaval region, suspicious for metastatic disease. Cholelithiasis and mild diffuse gallbladder wall thickening. Although this may be due to biliary obstruction, acute cholecystitis cannot definitely be excluded. Electronically Signed   By: Marlaine Hind M.D.   On: 11/14/2019 17:58   US Abdomen Limited RUQ  Result Date: 11/14/2019 CLINICAL DATA:  Elevated LFT EXAM: ULTRASOUND ABDOMEN LIMITED RIGHT UPPER QUADRANT COMPARISON:  CT abdomen pelvis 08/02/2018 FINDINGS: Gallbladder: Multiple gallstones measuring up to 7 mm in diameter. Negative sonographic Murphy sign. No gallbladder wall thickening or pericholecystic fluid. Common bile duct: Diameter: 13 mm.  Common bile duct is significantly dilated. Liver: No focal lesion identified. Within normal limits in parenchymal echogenicity. Portal vein is patent on color Doppler imaging with normal direction of blood flow towards the liver. Other: Negative for ascites IMPRESSION: Cholelithiasis without evidence of acute cholecystitis Common bile duct dilated at 13 mm. Possible common duct stone versus obstructing mass lesion. Electronically Signed   By: Franchot Gallo M.D.   On: 11/14/2019 13:39    ASSESSMENT & PLAN:   Cancer of head of pancreas ([place]) #  Pancreas adenocarcinoma-Stage IB-/boderlineresectable- uT2uNxuMx-[mass 22x21mm; invading/abutting superior mesenteric vein; 2 enlarged lymph nodes peripancreatic/porta hepatis largest 10 x 5.8 mm-nonpathologic based on EUS criteria/no biopsy]-borderline resectable.  PET scan no evidence of distant metastatic disease.  #Would recommend evaluation with surgery-for consideration of  resection.  We will make a referral to [place] [facility]; Dr. Barry Dienes.   #Given the abutment of the SMV-I think is reasonable to consider neoadjuvant chemotherapy.  I would recommend gemcitabine/Abraxane.  I am concerned about poor tolerance to FOLFIRINOX.  Discussed the potential side effects including but not limited to-increasing fatigue, nausea vomiting, diarrhea, hair loss, sores in the mouth, increase risk of infection and also neuropathy.   #Biliary obstruction status post ERCP and stenting-likely secondary to above malignancy; improving.  # # I reviewed the blood work- with the patient in detail; also reviewed the imaging independently [as summarized above]; and with the patient in detail.  Offered to speak to family patient declines.   # DISPOSITION: [id number]  home # chemo ed # port referral IR # Dr.[facility] # week of 20th- Gem-ab; MD- labs- chemo; labs- [email or web address]  All questions were answered. The patient knows to call the clinic with any problems, questions or concerns.    Cammie Sickle, MD 12/04/2019 5:06 PM

## 2019-12-05 ENCOUNTER — Telehealth: Payer: Self-pay

## 2019-12-05 LAB — CANCER ANTIGEN 19-9: CA 19-9: 95 U/mL — ABNORMAL HIGH (ref 0–35)

## 2019-12-05 NOTE — Telephone Encounter (Signed)
Port a cath scheduled for 12/14/19 with arrival time of 0930 for 1030. [facility] [facility]. Instructed to remain NPO for 8 hours prior to arrival time and have a driver. Went over all these instructions with Robin Daniels with read back performed.

## 2019-12-10 ENCOUNTER — Telehealth: Payer: Self-pay | Admitting: Internal Medicine

## 2019-12-10 DIAGNOSIS — C250 Malignant neoplasm of head of pancreas: Secondary | ICD-10-CM

## 2019-12-10 NOTE — Telephone Encounter (Signed)
12/10/2019  Called pt and informed her of her CT pancreas scan on 9/14 @ 11:30, ordered per Dr.B. Reminded her to be NPO 4 hours and arrive by 11 am to medical mall entrance  SR[facility]

## 2019-12-10 NOTE — Telephone Encounter (Signed)
Her appointment with Dr. Barry Dienes is 9/20.

## 2019-12-10 NOTE — Telephone Encounter (Signed)
H/T-please inform patient her case was discussed at the tumor conference-and is recommended to have a CT scan pancreas protocol ASAP.  This is ordered.  Please let her know I will call her later in the afternoon.  Stephanie-please schedule.   Kristie please follow-up on-appointment with Dr. Barry Dienes.   Thanks, GB

## 2019-12-10 NOTE — Telephone Encounter (Signed)
Patient is aware of the plan of care and contacted with ct scan apts.

## 2019-12-11 ENCOUNTER — Telehealth: Payer: Self-pay | Admitting: Internal Medicine

## 2019-12-11 ENCOUNTER — Ambulatory Visit
Admission: RE | Admit: 2019-12-11 | Discharge: 2019-12-11 | Disposition: A | Discharge status: Home / Self Care | Payer: Medicare Other | Source: Ambulatory Visit | Attending: Internal Medicine | Admitting: Internal Medicine

## 2019-12-11 ENCOUNTER — Other Ambulatory Visit: Payer: Self-pay

## 2019-12-11 DIAGNOSIS — C250 Malignant neoplasm of head of pancreas: Secondary | ICD-10-CM | POA: Insufficient documentation

## 2019-12-11 MED ORDER — IOHEXOL 300 MG/ML  SOLN
100.0000 mL | Freq: Once | INTRAMUSCULAR | Status: AC | PRN
  Administered 2019-12-11: 100 mL via INTRAVENOUS

## 2019-12-11 NOTE — Telephone Encounter (Signed)
I called the patient to discuss the need for CT pancreas protocol.  Unable to reach/no voicemail set up.  Patient was previously contacted by office/scheduled to have CT scan.

## 2019-12-12 ENCOUNTER — Other Ambulatory Visit: Payer: Self-pay

## 2019-12-12 NOTE — Progress Notes (Signed)
Pharmacist Chemotherapy Monitoring - Initial Assessment    Anticipated start date: 12/19/19  Regimen:   Are orders appropriate based on the patients diagnosis, regimen, and cycle? Yes  Does the plan date match the patients scheduled date? Yes  Is the sequencing of drugs appropriate? Yes  Are the premedications appropriate for the patients regimen? Yes  Prior Authorization for treatment is: Approved o If applicable, is the correct biosimilar selected based on the patient's insurance? not applicable  Organ Function and Labs:  Are dose adjustments needed based on the patient's renal function, hepatic function, or hematologic function? No  Are appropriate labs ordered prior to the start of patient's treatment? Yes  Other organ system assessment, if indicated: N/A  The following baseline labs, if indicated, have been ordered: N/A  Dose Assessment:  Are the drug doses appropriate?     Are the following correct: o Drug concentrations Yes o IV fluid compatible with drug Yes o Administration routes Yes o Timing of therapy Yes  If applicable, does the patient have documented access for treatment and/or plans for port-a-cath placement? yes  If applicable, have lifetime cumulative doses been properly documented and assessed? yes Lifetime Dose Tracking  No doses have been documented on this patient for the following tracked chemicals: Doxorubicin, Epirubicin, Idarubicin, Daunorubicin, Mitoxantrone, Bleomycin, Oxaliplatin, Carboplatin, Liposomal Doxorubicin  o   Toxicity Monitoring/Prevention:  The patient has the following take home antiemetics prescribed: Ondansetron, Prochlorperazine and Lorazepam  The patient has the following take home medications prescribed: N/A  Medication allergies and previous infusion related reactions, if applicable, have been reviewed and addressed. Yes  The patient's current medication list has been assessed for drug-drug interactions with their  chemotherapy regimen. no significant drug-drug interactions were identified on review.  Order Review:  Are the treatment plan orders signed? No  Is the patient scheduled to see a provider prior to their treatment? Yes  I verify that I have reviewed each item in the above checklist and answered each question accordingly.  Adelina Mings 12/12/2019 9:03 AM

## 2019-12-13 ENCOUNTER — Telehealth (INDEPENDENT_AMBULATORY_CARE_PROVIDER_SITE_OTHER): Payer: Medicare Other | Admitting: Gastroenterology

## 2019-12-13 ENCOUNTER — Encounter: Payer: Self-pay | Admitting: Gastroenterology

## 2019-12-13 ENCOUNTER — Other Ambulatory Visit: Payer: Self-pay | Admitting: Radiology

## 2019-12-13 DIAGNOSIS — C250 Malignant neoplasm of head of pancreas: Secondary | ICD-10-CM

## 2019-12-13 NOTE — Patient Instructions (Signed)
Nanoparticle Albumin-Bound Paclitaxel injection What is this medicine? NANOPARTICLE ALBUMIN-BOUND PACLITAXEL (Na no PAHR ti kuhl al BYOO muhn-bound PAK li TAX el) is a chemotherapy drug. It targets fast dividing cells, like cancer cells, and causes these cells to die. This medicine is used to treat advanced breast cancer, lung cancer, and pancreatic cancer. This medicine may be used for other purposes; ask your health care provider or pharmacist if you have questions. COMMON BRAND NAME(S): Abraxane What should I tell my health care provider before I take this medicine? They need to know if you have any of these conditions:  kidney disease  liver disease  low blood counts, like low white cell, platelet, or red cell counts  lung or breathing disease, like asthma  tingling of the fingers or toes, or other nerve disorder  an unusual or allergic reaction to paclitaxel, albumin, other chemotherapy, other medicines, foods, dyes, or preservatives  pregnant or trying to get pregnant  breast-feeding How should I use this medicine? This drug is given as an infusion into a vein. It is administered in a hospital or clinic by a specially trained health care professional. Talk to your pediatrician regarding the use of this medicine in children. Special care may be needed. Overdosage: If you think you have taken too much of this medicine contact a poison control center or emergency room at once. NOTE: This medicine is only for you. Do not share this medicine with others. What if I miss a dose? It is important not to miss your dose. Call your doctor or health care professional if you are unable to keep an appointment. What may interact with this medicine? This medicine may interact with the following medications:  antiviral medicines for hepatitis, HIV or AIDS  certain antibiotics like erythromycin and clarithromycin  certain medicines for fungal infections like ketoconazole and  itraconazole  certain medicines for seizures like carbamazepine, phenobarbital, phenytoin  gemfibrozil  nefazodone  rifampin  St. John's wort This list may not describe all possible interactions. Give your health care provider a list of all the medicines, herbs, non-prescription drugs, or dietary supplements you use. Also tell them if you smoke, drink alcohol, or use illegal drugs. Some items may interact with your medicine. What should I watch for while using this medicine? Your condition will be monitored carefully while you are receiving this medicine. You will need important blood work done while you are taking this medicine. This medicine can cause serious allergic reactions. If you experience allergic reactions like skin rash, itching or hives, swelling of the face, lips, or tongue, tell your doctor or health care professional right away. In some cases, you may be given additional medicines to help with side effects. Follow all directions for their use. This drug may make you feel generally unwell. This is not uncommon, as chemotherapy can affect healthy cells as well as cancer cells. Report any side effects. Continue your course of treatment even though you feel ill unless your doctor tells you to stop. Call your doctor or health care professional for advice if you get a fever, chills or sore throat, or other symptoms of a cold or flu. Do not treat yourself. This drug decreases your body's ability to fight infections. Try to avoid being around people who are sick. This medicine may increase your risk to bruise or bleed. Call your doctor or health care professional if you notice any unusual bleeding. Be careful brushing and flossing your teeth or using a toothpick because you may   get an infection or bleed more easily. If you have any dental work done, tell your dentist you are receiving this medicine. Avoid taking products that contain aspirin, acetaminophen, ibuprofen, naproxen, or  ketoprofen unless instructed by your doctor. These medicines may hide a fever. Do not become pregnant while taking this medicine or for 6 months after stopping it. Women should inform their doctor if they wish to become pregnant or think they might be pregnant. Men should not father a child while taking this medicine or for 3 months after stopping it. There is a potential for serious side effects to an unborn child. Talk to your health care professional or pharmacist for more information. Do not breast-feed an infant while taking this medicine or for 2 weeks after stopping it. This medicine may interfere with the ability to get pregnant or to father a child. You should talk to your doctor or health care professional if you are concerned about your fertility. What side effects may I notice from receiving this medicine? Side effects that you should report to your doctor or health care professional as soon as possible:  allergic reactions like skin rash, itching or hives, swelling of the face, lips, or tongue  breathing problems  changes in vision  fast, irregular heartbeat  low blood pressure  mouth sores  pain, tingling, numbness in the hands or feet  signs of decreased platelets or bleeding - bruising, pinpoint red spots on the skin, black, tarry stools, blood in the urine  signs of decreased red blood cells - unusually weak or tired, feeling faint or lightheaded, falls  signs of infection - fever or chills, cough, sore throat, pain or difficulty passing urine  signs and symptoms of liver injury like dark yellow or brown urine; general ill feeling or flu-like symptoms; light-colored stools; loss of appetite; nausea; right upper belly pain; unusually weak or tired; yellowing of the eyes or skin  swelling of the ankles, feet, hands  unusually slow heartbeat Side effects that usually do not require medical attention (report to your doctor or health care professional if they continue or  are bothersome):  diarrhea  hair loss  loss of appetite  nausea, vomiting  tiredness This list may not describe all possible side effects. Call your doctor for medical advice about side effects. You may report side effects to FDA at 1-800-FDA-1088. Where should I keep my medicine? This drug is given in a hospital or clinic and will not be stored at home. NOTE: This sheet is a summary. It may not cover all possible information. If you have questions about this medicine, talk to your doctor, pharmacist, or health care provider.  2020 [employer] Standard (2016-11-16 13:03:45)  Gemcitabine injection What is this medicine? GEMCITABINE (jem SYE ta been) is a chemotherapy drug. This medicine is used to treat many types of cancer like breast cancer, lung cancer, pancreatic cancer, and ovarian cancer. This medicine may be used for other purposes; ask your health care provider or pharmacist if you have questions. COMMON BRAND NAME(S): Gemzar, Infugem What should I tell my health care provider before I take this medicine? They need to know if you have any of these conditions:  blood disorders  infection  kidney disease  liver disease  lung or breathing disease, like asthma  recent or ongoing radiation therapy  an unusual or allergic reaction to gemcitabine, other chemotherapy, other medicines, foods, dyes, or preservatives  pregnant or trying to get pregnant  breast-feeding How should I use this   medicine? This drug is given as an infusion into a vein. It is administered in a hospital or clinic by a specially trained health care professional. Talk to your pediatrician regarding the use of this medicine in children. Special care may be needed. Overdosage: If you think you have taken too much of this medicine contact a poison control center or emergency room at once. NOTE: This medicine is only for you. Do not share this medicine with others. What if I miss a dose? It is  important not to miss your dose. Call your doctor or health care professional if you are unable to keep an appointment. What may interact with this medicine?  medicines to increase blood counts like filgrastim, pegfilgrastim, sargramostim  some other chemotherapy drugs like cisplatin  vaccines Talk to your doctor or health care professional before taking any of these medicines:  acetaminophen  aspirin  ibuprofen  ketoprofen  naproxen This list may not describe all possible interactions. Give your health care provider a list of all the medicines, herbs, non-prescription drugs, or dietary supplements you use. Also tell them if you smoke, drink alcohol, or use illegal drugs. Some items may interact with your medicine. What should I watch for while using this medicine? Visit your doctor for checks on your progress. This drug may make you feel generally unwell. This is not uncommon, as chemotherapy can affect healthy cells as well as cancer cells. Report any side effects. Continue your course of treatment even though you feel ill unless your doctor tells you to stop. In some cases, you may be given additional medicines to help with side effects. Follow all directions for their use. Call your doctor or health care professional for advice if you get a fever, chills or sore throat, or other symptoms of a cold or flu. Do not treat yourself. This drug decreases your body's ability to fight infections. Try to avoid being around people who are sick. This medicine may increase your risk to bruise or bleed. Call your doctor or health care professional if you notice any unusual bleeding. Be careful brushing and flossing your teeth or using a toothpick because you may get an infection or bleed more easily. If you have any dental work done, tell your dentist you are receiving this medicine. Avoid taking products that contain aspirin, acetaminophen, ibuprofen, naproxen, or ketoprofen unless instructed by  your doctor. These medicines may hide a fever. Do not become pregnant while taking this medicine or for 6 months after stopping it. Women should inform their doctor if they wish to become pregnant or think they might be pregnant. Men should not father a child while taking this medicine and for 3 months after stopping it. There is a potential for serious side effects to an unborn child. Talk to your health care professional or pharmacist for more information. Do not breast-feed an infant while taking this medicine or for at least 1 week after stopping it. Men should inform their doctors if they wish to father a child. This medicine may lower sperm counts. Talk with your doctor or health care professional if you are concerned about your fertility. What side effects may I notice from receiving this medicine? Side effects that you should report to your doctor or health care professional as soon as possible:  allergic reactions like skin rash, itching or hives, swelling of the face, lips, or tongue  breathing problems  pain, redness, or irritation at site where injected  signs and symptoms of a dangerous   change in heartbeat or heart rhythm like chest pain; dizziness; fast or irregular heartbeat; palpitations; feeling faint or lightheaded, falls; breathing problems  signs of decreased platelets or bleeding - bruising, pinpoint red spots on the skin, black, tarry stools, blood in the urine  signs of decreased red blood cells - unusually weak or tired, feeling faint or lightheaded, falls  signs of infection - fever or chills, cough, sore throat, pain or difficulty passing urine  signs and symptoms of kidney injury like trouble passing urine or change in the amount of urine  signs and symptoms of liver injury like dark yellow or brown urine; general ill feeling or flu-like symptoms; light-colored stools; loss of appetite; nausea; right upper belly pain; unusually weak or tired; yellowing of the eyes or  skin  swelling of ankles, feet, hands Side effects that usually do not require medical attention (report to your doctor or health care professional if they continue or are bothersome):  constipation  diarrhea  hair loss  loss of appetite  nausea  rash  vomiting This list may not describe all possible side effects. Call your doctor for medical advice about side effects. You may report side effects to FDA at 1-800-FDA-1088. Where should I keep my medicine? This drug is given in a hospital or clinic and will not be stored at home. NOTE: This sheet is a summary. It may not cover all possible information. If you have questions about this medicine, talk to your doctor, pharmacist, or health care provider.  2020 [employer]/Gold Standard (2017-06-08 18:06:11)   

## 2019-12-14 ENCOUNTER — Other Ambulatory Visit: Payer: Self-pay

## 2019-12-14 ENCOUNTER — Ambulatory Visit
Admission: RE | Admit: 2019-12-14 | Discharge: 2019-12-14 | Disposition: A | Discharge status: Home / Self Care | Payer: Medicare Other | Source: Ambulatory Visit | Attending: Internal Medicine | Admitting: Internal Medicine

## 2019-12-14 DIAGNOSIS — E785 Hyperlipidemia, unspecified: Secondary | ICD-10-CM | POA: Diagnosis not present

## 2019-12-14 DIAGNOSIS — C250 Malignant neoplasm of head of pancreas: Secondary | ICD-10-CM

## 2019-12-14 DIAGNOSIS — E119 Type 2 diabetes mellitus without complications: Secondary | ICD-10-CM | POA: Diagnosis not present

## 2019-12-14 DIAGNOSIS — Z79899 Other long term (current) drug therapy: Secondary | ICD-10-CM | POA: Insufficient documentation

## 2019-12-14 DIAGNOSIS — F419 Anxiety disorder, unspecified: Secondary | ICD-10-CM | POA: Diagnosis not present

## 2019-12-14 DIAGNOSIS — I10 Essential (primary) hypertension: Secondary | ICD-10-CM | POA: Insufficient documentation

## 2019-12-14 DIAGNOSIS — D649 Anemia, unspecified: Secondary | ICD-10-CM | POA: Insufficient documentation

## 2019-12-14 HISTORY — PX: IR IMAGING GUIDED PORT INSERTION: IMG5740

## 2019-12-14 MED ORDER — FENTANYL CITRATE (PF) 100 MCG/2ML IJ SOLN
INTRAMUSCULAR | Status: AC | PRN
Start: 2019-12-14 — End: 2019-12-14
  Administered 2019-12-14 (×2): 50 ug via INTRAVENOUS

## 2019-12-14 MED ORDER — SODIUM CHLORIDE 0.9 % IV SOLN
INTRAVENOUS | Status: DC
  Administered 2019-12-14: 1000 mL via INTRAVENOUS

## 2019-12-14 MED ORDER — CEFAZOLIN SODIUM-DEXTROSE 2-4 GM/100ML-% IV SOLN
INTRAVENOUS | Status: AC
  Filled 2019-12-14: qty 100

## 2019-12-14 MED ORDER — CEFAZOLIN SODIUM-DEXTROSE 1-4 GM/50ML-% IV SOLN
INTRAVENOUS | Status: AC | PRN
  Administered 2019-12-14: 2 g via INTRAVENOUS

## 2019-12-14 MED ORDER — CEFAZOLIN SODIUM-DEXTROSE 2-4 GM/100ML-% IV SOLN: 2.0000 g | INTRAVENOUS | Status: DC

## 2019-12-14 MED ORDER — FENTANYL CITRATE (PF) 100 MCG/2ML IJ SOLN
INTRAMUSCULAR | Status: AC
  Filled 2019-12-14: qty 2

## 2019-12-14 MED ORDER — MIDAZOLAM HCL 2 MG/2ML IJ SOLN
INTRAMUSCULAR | Status: AC | PRN
  Administered 2019-12-14 (×2): 1 mg via INTRAVENOUS

## 2019-12-14 MED ORDER — MIDAZOLAM HCL 2 MG/2ML IJ SOLN
INTRAMUSCULAR | Status: AC
  Filled 2019-12-14: qty 2

## 2019-12-14 NOTE — Procedures (Signed)
Interventional Radiology Procedure Note  Procedure: RT IJ POWER PORT    Complications: None  Estimated Blood Loss:  MIN  Findings: TIP SVCRA    M. TREVOR Maryan Sivak, MD    

## 2019-12-14 NOTE — Progress Notes (Signed)
Vonda Antigua, MD [address]  [place]  [place], [place] [address]  Main: [phone]  Fax: [phone]   Primary Care Physician: [facility]  Virtual Visit via Telephone Note  I connected with patient on 12/14/19 at  3:45 PM EDT by telephone and verified that I am speaking with the correct person using two identifiers.   I discussed the limitations, risks, security and privacy concerns of performing an evaluation and management service by telephone and the availability of in person appointments. I also discussed with the patient that there may be a patient responsible charge related to this service. The patient expressed understanding and agreed to proceed.  Location of Patient: Home Location of Provider: Home Persons involved: Patient and provider only during the visit (nursing staff and front desk staff was involved in communicating with the patient prior to the appointment, reviewing medications and checking them in)   History of Present Illness: Chief complaint: Hospital follow-up  HPI: Robin Daniels is a 64 y.o. 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  Patient seeing Dr. Rogue Bussing of oncology.  As per his last note, patient has been referred to [place] [facility], Dr. Barry Dienes for consideration of surgical resection of the pancreatic mass.  Patient states this appointment is in early October.  Patient had a plastic stent placement by Dr. Allen Norris on 11/16/2019.  The patient denies abdominal or flank pain, anorexia, nausea or vomiting, dysphagia, change in bowel habits or black or bloody stools or weight loss.  Most recent blood work on 12/04/2019 shows normal bilirubin, and mildly elevated alk phos, which has significantly improved compared to prior to stent placement.  Normal transaminases recently as well.  Current  Outpatient Medications  Medication Sig Dispense Refill  . acetaminophen (TYLENOL) 650 MG CR tablet Take 1 tablet by mouth every 8 (eight) hours as needed.    Marland Kitchen amLODipine (NORVASC) 10 MG tablet Take 10 mg by mouth daily.    . metoprolol succinate (TOPROL-XL) 50 MG 24 hr tablet Take 50 mg by mouth daily. Take with or immediately following a meal.     . Potassium Chloride ER 20 MEQ TBCR Take 20 mEq by mouth daily.     . vitamin B-12 (CYANOCOBALAMIN) 1000 MCG tablet Take 1 tablet (1,000 mcg total) by mouth daily.     No current facility-administered medications for this visit.   Facility-Administered Medications Ordered in Other Visits  Medication Dose Route Frequency Provider Last Rate Last Admin  . 0.9 %  sodium chloride infusion   Intravenous Continuous Monia Sabal, PA-C      . ceFAZolin (ANCEF) 2-4 GM/100ML-% IVPB           . ceFAZolin (ANCEF) IVPB 2g/100 mL premix  2 g Intravenous to Edward Jolly, PA-C        Allergies as of 12/13/2019  . (No Known Allergies)    Review of Systems:    All systems reviewed and negative except where noted in HPI.   Observations/Objective:  Labs: CMP     Component Value Date/Time   NA 136 12/04/2019 1327   K 3.6 12/04/2019 1327   CL 102 12/04/2019 1327   CO2 26 12/04/2019 1327   GLUCOSE 126 (H) 12/04/2019 1327   BUN 7 (L) 12/04/2019 1327   CREATININE 0.90 12/04/2019 1327   CALCIUM 8.7 (L) 12/04/2019 1327   PROT 7.3 12/04/2019  1327   ALBUMIN 3.5 12/04/2019 1327   AST 19 12/04/2019 1327   ALT 22 12/04/2019 1327   ALKPHOS 156 (H) 12/04/2019 1327   BILITOT 0.9 12/04/2019 1327   GFRNONAA >60 12/04/2019 1327   GFRAA >60 12/04/2019 1327   Lab Results  Component Value Date   WBC 5.9 12/04/2019   HGB 10.2 (L) 12/04/2019   HCT 31.5 (L) 12/04/2019   MCV 71.9 (L) 12/04/2019   PLT 418 (H) 12/04/2019    Imaging Studies: MR 3D Recon At Scanner  Result Date: 11/14/2019 CLINICAL DATA:  Cholelithiasis and biliary ductal dilatation on  recent ultrasound. EXAM: MRI ABDOMEN WITHOUT AND WITH CONTRAST (INCLUDING MRCP) TECHNIQUE: Multiplanar multisequence MR imaging of the abdomen was performed both before and after the administration of intravenous contrast. Heavily T2-weighted images of the biliary and pancreatic ducts were obtained, and three-dimensional MRCP images were rendered by post processing. CONTRAST:  26m GADAVIST GADOBUTROL 1 MMOL/ML IV SOLN COMPARISON:  Ultrasound on 11/14/2019 and noncontrast CT on 08/02/2018 FINDINGS: Lower chest: No acute findings. 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 Hepatobiliary: No hepatic masses identified. Gallbladder is dilated and contains tiny gallstones. Mild diffuse gallbladder wall thickening is seen, without pericholecystic inflammatory changes or fluid. Diffuse biliary ductal dilatation is new since previous study. Abrupt stricture of the distal common bile duct is seen in the pancreatic head. Pancreas: An irregular lesion with both solid and cystic areas is seen in the pancreatic head which measures 2.3 x 2.2 cm on image 47/17. A cystic lesion is also seen in the pancreatic head and neck which measures 2.7 by 2.5 cm on image 21/4. These findings are suspicious for pancreatic carcinoma. Mild pancreatic ductal dilatation noted in the pancreatic body and tail. Spleen:  Within normal limits in size and appearance. Adrenals/Urinary Tract: No masses identified. No evidence of hydronephrosis. Stomach/Bowel: Visualized portion unremarkable. Vascular/Lymphatic: 11 mm peripancreatic lymph node is seen in the porta hepatis and 10 mm portacaval lymph node is also demonstrated (image 15/8). No other sites of lymphadenopathy identified within the abdomen. No abdominal aortic aneurysm. Other:  None. Musculoskeletal:  No suspicious bone lesions identified. IMPRESSION: New diffuse biliary ductal dilatation, with abrupt stricture of the distal common bile duct in the pancreatic head. 3 cm irregular  predominantly solid lesion with cystic areas in the pancreatic head, highly suspicious for pancreatic carcinoma. Consider endoscopic ultrasound for further evaluation. Mild lymphadenopathy in the porta hepatis and portacaval region, suspicious for metastatic disease. Cholelithiasis and mild diffuse gallbladder wall thickening. Although this may be due to biliary obstruction, acute cholecystitis cannot definitely be excluded. Electronically Signed   By: JMarlaine HindM.D.   On: 11/14/2019 17:58   NM PET Image Initial (PI) Skull Base To Thigh  Result Date: 11/28/2019 CLINICAL DATA:  Initial treatment strategy for pancreatic mass. EXAM: NUCLEAR MEDICINE PET SKULL BASE TO THIGH TECHNIQUE: 10.96 mCi F-18 FDG was injected intravenously. Full-ring PET imaging was performed from the skull base to thigh after the radiotracer. CT data was obtained and used for attenuation correction and anatomic localization. Fasting blood glucose: 138 mg/dl COMPARISON:  MRI 11/14/2019 FINDINGS: Mediastinal blood pool activity: SUV max 3.24 Liver activity: SUV max NA NECK: Mild asymmetric increased radiotracer uptake within the right tonsillar region is identified when compared with the left. SUV max in the right tonsillar region is equal to 7.966 versus 5.91 on the contralateral side. Additionally, there is a right level 2 lymph node which exhibits an SUV max of  5.01. Incidental CT findings: none CHEST: No FDG avid supraclavicular, axillary, mediastinal or hilar lymph nodes. No FDG avid pulmonary nodule or mass identified. There is a large fat attenuation mass overlying the posterior and lateral right lower lobe. 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 Incidental CT findings: Aortic atherosclerosis. Coronary artery calcifications. ABDOMEN/PELVIS: No focal liver abnormality identified. Pneumobilia is identified status post common bile duct stent. Mild increased  radiotracer uptake localizes to the head of pancreas surrounding the common bile duct stent with SUV max of 5.39. No FDG avid foci within the spleen or adrenal glands. No FDG avid abdominopelvic lymph nodes. The cystic lesions within head of pancreas are better seen on recent MRI. Incidental CT findings: Mild aortic atherosclerosis. Gallstones identified. SKELETON: No focal hypermetabolic activity to suggest skeletal metastasis. Incidental CT findings: none IMPRESSION: 1. Mild radiotracer uptake localizes to the head of pancreas in the area surrounding the common bile duct stent. Cannot rule out underlying pancreatic neoplasm. Consider further evaluation with endoscopic ultrasound with tissue sampling if indicated. 2. No signs of FDG avid liver metastasis or upper abdominal nodal metastasis. 3. There is asymmetric increased uptake within the right tonsillar region with a mildly FDG avid right level 2 lymph node. Findings may be related to upper respiratory tract viral infection. Underlying head neck neoplasm would be difficult to exclude. Correlate for any clinical signs or symptoms of recent upper respiratory tract viral infection. If clinically indicated contrast enhanced CT or MRI of the neck and or direct visualization is advised. Electronically Signed   By: Kerby Moors M.D.   On: 11/28/2019 14:04   DG C-Arm 1-60 Min-No Report  Result Date: 11/16/2019 Fluoroscopy was utilized by the requesting physician.  No radiographic interpretation.   MR ABDOMEN MRCP W WO CONTAST  Result Date: 11/14/2019 CLINICAL DATA:  Cholelithiasis and biliary ductal dilatation on recent ultrasound. EXAM: MRI ABDOMEN WITHOUT AND WITH CONTRAST (INCLUDING MRCP) TECHNIQUE: Multiplanar multisequence MR imaging of the abdomen was performed both before and after the administration of intravenous contrast. Heavily T2-weighted images of the biliary and pancreatic ducts were obtained, and three-dimensional MRCP images were rendered by  post processing. CONTRAST:  75m GADAVIST GADOBUTROL 1 MMOL/ML IV SOLN COMPARISON:  Ultrasound on 11/14/2019 and noncontrast CT on 08/02/2018 FINDINGS: Lower chest: No acute findings. 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 Hepatobiliary: No hepatic masses identified. Gallbladder is dilated and contains tiny gallstones. Mild diffuse gallbladder wall thickening is seen, without pericholecystic inflammatory changes or fluid. Diffuse biliary ductal dilatation is new since previous study. Abrupt stricture of the distal common bile duct is seen in the pancreatic head. Pancreas: An irregular lesion with both solid and cystic areas is seen in the pancreatic head which measures 2.3 x 2.2 cm on image 47/17. A cystic lesion is also seen in the pancreatic head and neck which measures 2.7 by 2.5 cm on image 21/4. These findings are suspicious for pancreatic carcinoma. Mild pancreatic ductal dilatation noted in the pancreatic body and tail. Spleen:  Within normal limits in size and appearance. Adrenals/Urinary Tract: No masses identified. No evidence of hydronephrosis. Stomach/Bowel: Visualized portion unremarkable. Vascular/Lymphatic: 11 mm peripancreatic lymph node is seen in the porta hepatis and 10 mm portacaval lymph node is also demonstrated (image 15/8). No other sites of lymphadenopathy identified within the abdomen. No abdominal aortic aneurysm. Other:  None. Musculoskeletal:  No suspicious bone lesions identified. IMPRESSION: New diffuse biliary ductal  dilatation, with abrupt stricture of the distal common bile duct in the pancreatic head. 3 cm irregular predominantly solid lesion with cystic areas in the pancreatic head, highly suspicious for pancreatic carcinoma. Consider endoscopic ultrasound for further evaluation. Mild lymphadenopathy in the porta hepatis and portacaval region, suspicious for metastatic disease. Cholelithiasis and mild diffuse gallbladder wall thickening. Although this may be  due to biliary obstruction, acute cholecystitis cannot definitely be excluded. Electronically Signed   By: Marlaine Hind M.D.   On: 11/14/2019 17:58   CT PANCREAS ABD W/WO  Result Date: 12/11/2019 CLINICAL DATA:  Pancreatic cancer staging, ERCP 11/16/2019 EXAM: CT ABDOMEN WITHOUT AND WITH CONTRAST TECHNIQUE: Multidetector CT imaging of the abdomen was performed following the standard protocol before and following the bolus administration of intravenous contrast. CONTRAST:  163m OMNIPAQUE IOHEXOL 300 MG/ML  SOLN COMPARISON:  MR abdomen, 11/14/2019, CT abdomen pelvis, 08/02/2018 FINDINGS: Lower chest: No acute abnormality. Hepatobiliary: No focal liver abnormality is seen. Distended gallbladder containing multiple small gallstones. Persistent although reduced intra and extrahepatic biliary ductal dilatation following common bile duct stent placement. There is post stenting pneumobilia. Pancreas: Redemonstrated, ill-defined hypodense lesion of the superior pancreatic head, difficult to discretely measure although approximately 2.0 x 2.0 cm (series 14, image 21). This is traversed by the common bile duct and a common bile duct stent. There is an additional cystic lesion in the pancreatic head and neck measuring approximately 2.5 x 1.5 cm (series 14, image 24). The pancreatic duct is nearly effaced in the central pancreatic head (series 14, image 23). There is mild dilatation of the pancreatic duct in the neck up to 5 mm. Primary lesion appears to be well separated from adjacent vascular structures with the possible exception of the peripheral portion of the portal vein which may abut along a small segment of the anterior surface of the vessel (series 14, image 16). Spleen: Normal in size without focal abnormality. Adrenals/Urinary Tract: Adrenal glands are unremarkable. Kidneys are normal, without renal calculi, focal lesion, or hydronephrosis. Stomach/Bowel: Stomach is within normal limits. Appendix appears normal.  No evidence of bowel wall thickening, distention, or inflammatory changes. Vascular/Lymphatic: Aortic atherosclerosis. No enlarged abdominal lymph nodes. Other: No abdominal wall hernia or abnormality. Musculoskeletal: No acute or significant osseous findings. IMPRESSION: 1. Redemonstrated, ill-defined hypodense lesion of the superior pancreatic head, difficult to discretely measure although approximately 2.0 x 2.0 cm. This is traversed by the common bile duct and a common bile duct stent. 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 2. There is an additional cystic lesion in the pancreatic head and neck measuring approximately 2.5 x 1.5 cm. This is of uncertain significance and may reflect a pseudocyst or IPMN. 3. The pancreatic duct is nearly effaced in the central pancreatic head. There is mild dilatation of the pancreatic duct in the neck up to 5 mm. 4. Primary lesion appears to be well separated from adjacent vascular structures with the possible exception of the peripheral portion of the portal vein which may abut along a small segment of the anterior surface of the vessel. The celiac vessels, superior mesenteric artery, superior mesenteric vein, and splenic vessels do not appear to be involved. 5. No evidence of lymphadenopathy or metastatic disease in the abdomen. 6. Distended gallbladder with cholelithiasis. 7. Aortic Atherosclerosis (ICD10-I70.0). Electronically Signed   By: AEddie CandleM.D.   On: 12/11/2019 15:12   [place]domen Limited RUQ  Result Date: 11/14/2019 CLINICAL DATA:  Elevated LFT EXAM: ULTRASOUND ABDOMEN LIMITED RIGHT UPPER QUADRANT COMPARISON:  CT abdomen pelvis 08/02/2018 FINDINGS: Gallbladder: Multiple gallstones measuring up to 7 mm in diameter. Negative sonographic Murphy sign. No gallbladder wall thickening or pericholecystic fluid. Common bile duct: Diameter: 13 mm.  Common bile duct is significantly dilated. Liver: No focal lesion identified. Within normal limits  in parenchymal echogenicity. Portal vein is patent on color Doppler imaging with normal direction of blood flow towards the liver. Other: Negative for ascites IMPRESSION: Cholelithiasis without evidence of acute cholecystitis Common bile duct dilated at 13 mm. Possible common duct stone versus obstructing mass lesion. Electronically Signed   By: Franchot Gallo M.D.   On: 11/14/2019 13:39    Assessment and Plan:   Robin Daniels is a 64 y.o. y/o female with pancreatic cancer, plastic biliary stent placement in August 2021 with Dr. Allen Norris here for follow-up  Assessment and Plan: Patient will need repeat procedure for stent removal/exchange with Dr. Allen Norris in the near future  This would likely be best done after her upcoming surgical consultation to see what they are planning, to be able to determine what kind of stent would be best  We will await further surgical and oncology plans before scheduling her for her repeat procedure for stent exchange  Will communicate above with Dr. Allen Norris as well  Follow Up Instructions:    I discussed the assessment and treatment plan with the patient. The patient was provided an opportunity to ask questions and all were answered. The patient agreed with the plan and demonstrated an understanding of the instructions.   The patient was advised to call back or seek an in-person evaluation if the symptoms worsen or if the condition fails to improve as anticipated.  I provided 15 minutes of non-face-to-face time during this encounter. Additional time was spent in reviewing patient's chart, placing orders etc.   Virgel Manifold, MD  Speech recognition software was used to dictate this note.

## 2019-12-14 NOTE — Consult Note (Signed)
Chief Complaint: Pancreatic adenocarcinoma. Request is for portacath placement for chemotherapy access.   Referring Physician(s): Cammie Sickle  Supervising Physician: Daryll Brod  Patient Status: [facility] - Out-pt  History of Present Illness: Robin Daniels is a 64 y.o. female 64 y.o. female outpatient. History of HTN, anemia, DM, newly diagnosed pancreatic adenocarcinoma borderline resectable when being worked up for elevated liver enzymes  Team is requesting a portacath placement for chemotherapy access.     Past Medical History:  Diagnosis Date  . Anemia    history of  . Anxiety 09/11/2014  . Arthritis   . Cancer of head of pancreas ([place]) 12/04/2019  . Diabetes mellitus without complication ([place])   . Fluttering heart   . Hyperlipidemia   . Hypertension     Past Surgical History:  Procedure Laterality Date  . ENDOSCOPIC RETROGRADE CHOLANGIOPANCREATOGRAPHY (ERCP) WITH PROPOFOL N/A 11/16/2019   Procedure: ENDOSCOPIC RETROGRADE CHOLANGIOPANCREATOGRAPHY (ERCP) WITH PROPOFOL;  Surgeon: Lucilla Lame, MD;  Location: [facility] ENDOSCOPY;  Service: Endoscopy;  Laterality: N/A;  . EUS N/A 11/29/2019   Procedure: FULL UPPER ENDOSCOPIC ULTRASOUND (EUS) RADIAL;  Surgeon: Reita Cliche, MD;  Location: [facility] ENDOSCOPY;  Service: Gastroenterology;  Laterality: N/A;  . right knee replacement Right [id number]  . SHOULDER ARTHROSCOPY W/ ROTATOR CUFF REPAIR Right     Allergies: Patient has no known allergies.  Medications: Prior to Admission medications   Medication Sig Start Date End Date Taking? Authorizing Provider  amLODipine (NORVASC) 10 MG tablet Take 10 mg by mouth daily. 11/21/19  Yes [provider]  metoprolol succinate (TOPROL-XL) 50 MG 24 hr tablet Take 50 mg by mouth daily. Take with or immediately following a meal.    Yes [provider]  Potassium Chloride ER 20 MEQ TBCR Take 20 mEq by mouth daily.    Yes [provider]  vitamin B-12  (CYANOCOBALAMIN) 1000 MCG tablet Take 1 tablet (1,000 mcg total) by mouth daily. 11/17/19  Yes Jennye Boroughs, MD  acetaminophen (TYLENOL) 650 MG CR tablet Take 1 tablet by mouth every 8 (eight) hours as needed. 09/05/19   [provider]     Family History  Problem Relation Age of Onset  . Diabetes Mother   . Hyperlipidemia Mother   . Hypertension Mother   . Vision loss Mother   . Arthritis Father   . Hyperlipidemia Father   . Hypertension Father   . Breast cancer Maternal Aunt     Social History   Socioeconomic History  . Marital status: Divorced    Spouse name: Not on file  . Number of children: Not on file  . Years of education: Not on file  . Highest education level: Not on file  Occupational History  . Not on file  Tobacco Use  . Smoking status: Never Smoker  . Smokeless tobacco: Never Used  Vaping Use  . Vaping Use: Never used  Substance and Sexual Activity  . Alcohol use: No    Alcohol/week: 0.0 standard drinks  . Drug use: No  . Sexual activity: Never  Other Topics Concern  . Not on file  Social History Narrative   Lives in [place]; with mom and son; worked in dietary; never smoked; no alcohol.    Social Determinants of Health   Financial Resource Strain:   . Difficulty of Paying Living Expenses: Not on file  Food Insecurity:   . Worried About Charity fundraiser in the Last Year: Not on file  . Ran Out of Food  in the Last Year: Not on file  Transportation Needs:   . Lack of Transportation (Medical): Not on file  . Lack of Transportation (Non-Medical): Not on file  Physical Activity:   . Days of Exercise per Week: Not on file  . Minutes of Exercise per Session: Not on file  Stress:   . Feeling of Stress : Not on file  Social Connections:   . Frequency of Communication with Friends and Family: Not on file  . Frequency of Social Gatherings with Friends and Family: Not on file  . Attends Religious Services: Not on file  . Active Member of  Clubs or Organizations: Not on file  . Attends Archivist Meetings: Not on file  . Marital Status: Not on file     Review of Systems: A 12 point ROS discussed and pertinent positives are indicated in the HPI above.  All other systems are negative.  Review of Systems  Constitutional: Negative for fatigue and fever.  HENT: Negative for congestion.   Respiratory: Negative for cough and shortness of breath.   Gastrointestinal: Negative for abdominal pain, diarrhea, nausea and vomiting.    Vital Signs: BP (!) 169/77   Pulse 84   Temp 98.1 F (36.7 C) (Oral)   Resp 16   Ht 5\' 3"  (1.6 m)   Wt 193 lb (87.5 kg)   LMP  (LMP Unknown)   SpO2 97%   BMI 34.19 kg/m   Physical Exam Vitals and nursing note reviewed.  Constitutional:      Appearance: She is well-developed.  HENT:     Head: Normocephalic and atraumatic.  Eyes:     Conjunctiva/sclera: Conjunctivae normal.  Cardiovascular:     Rate and Rhythm: Normal rate and regular rhythm.     Heart sounds: Normal heart sounds.  Pulmonary:     Effort: Pulmonary effort is normal.     Breath sounds: Normal breath sounds.  Musculoskeletal:        General: Normal range of motion.     Cervical back: Normal range of motion.  Skin:    General: Skin is warm.  Neurological:     Mental Status: She is alert and oriented to person, place, and time.     Imaging: MR 3D Recon At Scanner  Result Date: 11/14/2019 CLINICAL DATA:  Cholelithiasis and biliary ductal dilatation on recent ultrasound. EXAM: MRI ABDOMEN WITHOUT AND WITH CONTRAST (INCLUDING MRCP) TECHNIQUE: Multiplanar multisequence MR imaging of the abdomen was performed both before and after the administration of intravenous contrast. Heavily T2-weighted images of the biliary and pancreatic ducts were obtained, and three-dimensional MRCP images were rendered by post processing. CONTRAST:  56mL GADAVIST GADOBUTROL 1 MMOL/ML IV SOLN COMPARISON:  Ultrasound on 11/14/2019 and  noncontrast CT on 08/02/2018 FINDINGS: Lower chest: No acute findings. 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 Hepatobiliary: No hepatic masses identified. Gallbladder is dilated and contains tiny gallstones. Mild diffuse gallbladder wall thickening is seen, without pericholecystic inflammatory changes or fluid. Diffuse biliary ductal dilatation is new since previous study. Abrupt stricture of the distal common bile duct is seen in the pancreatic head. Pancreas: An irregular lesion with both solid and cystic areas is seen in the pancreatic head which measures 2.3 x 2.2 cm on image 47/17. A cystic lesion is also seen in the pancreatic head and neck which measures 2.7 by 2.5 cm on image 21/4. These findings are suspicious for pancreatic carcinoma. Mild pancreatic ductal dilatation noted in the pancreatic body  and tail. Spleen:  Within normal limits in size and appearance. Adrenals/Urinary Tract: No masses identified. No evidence of hydronephrosis. Stomach/Bowel: Visualized portion unremarkable. Vascular/Lymphatic: 11 mm peripancreatic lymph node is seen in the porta hepatis and 10 mm portacaval lymph node is also demonstrated (image 15/8). No other sites of lymphadenopathy identified within the abdomen. No abdominal aortic aneurysm. Other:  None. Musculoskeletal:  No suspicious bone lesions identified. IMPRESSION: New diffuse biliary ductal dilatation, with abrupt stricture of the distal common bile duct in the pancreatic head. 3 cm irregular predominantly solid lesion with cystic areas in the pancreatic head, highly suspicious for pancreatic carcinoma. Consider endoscopic ultrasound for further evaluation. Mild lymphadenopathy in the porta hepatis and portacaval region, suspicious for metastatic disease. Cholelithiasis and mild diffuse gallbladder wall thickening. Although this may be due to biliary obstruction, acute cholecystitis cannot definitely be excluded. Electronically Signed   By: Marlaine Hind M.D.   On: 11/14/2019 17:58   NM PET Image Initial (PI) Skull Base To Thigh  Result Date: 11/28/2019 CLINICAL DATA:  Initial treatment strategy for pancreatic mass. EXAM: NUCLEAR MEDICINE PET SKULL BASE TO THIGH TECHNIQUE: 10.96 mCi F-18 FDG was injected intravenously. Full-ring PET imaging was performed from the skull base to thigh after the radiotracer. CT data was obtained and used for attenuation correction and anatomic localization. Fasting blood glucose: 138 mg/dl COMPARISON:  MRI 11/14/2019 FINDINGS: Mediastinal blood pool activity: SUV max 3.24 Liver activity: SUV max NA NECK: Mild asymmetric increased radiotracer uptake within the right tonsillar region is identified when compared with the left. SUV max in the right tonsillar region is equal to 7.966 versus 5.91 on the contralateral side. Additionally, there is a right level 2 lymph node which exhibits an SUV max of 5.01. Incidental CT findings: none CHEST: No FDG avid supraclavicular, axillary, mediastinal or hilar lymph nodes. No FDG avid pulmonary nodule or mass identified. There is a large fat attenuation mass overlying the posterior and lateral right lower lobe. 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 Incidental CT findings: Aortic atherosclerosis. Coronary artery calcifications. ABDOMEN/PELVIS: No focal liver abnormality identified. Pneumobilia is identified status post common bile duct stent. Mild increased radiotracer uptake localizes to the head of pancreas surrounding the common bile duct stent with SUV max of 5.39. No FDG avid foci within the spleen or adrenal glands. No FDG avid abdominopelvic lymph nodes. The cystic lesions within head of pancreas are better seen on recent MRI. Incidental CT findings: Mild aortic atherosclerosis. Gallstones identified. SKELETON: No focal hypermetabolic activity to suggest skeletal metastasis. Incidental CT findings: none IMPRESSION:  1. Mild radiotracer uptake localizes to the head of pancreas in the area surrounding the common bile duct stent. Cannot rule out underlying pancreatic neoplasm. Consider further evaluation with endoscopic ultrasound with tissue sampling if indicated. 2. No signs of FDG avid liver metastasis or upper abdominal nodal metastasis. 3. There is asymmetric increased uptake within the right tonsillar region with a mildly FDG avid right level 2 lymph node. Findings may be related to upper respiratory tract viral infection. Underlying head neck neoplasm would be difficult to exclude. Correlate for any clinical signs or symptoms of recent upper respiratory tract viral infection. If clinically indicated contrast enhanced CT or MRI of the neck and or direct visualization is advised. Electronically Signed   By: Kerby Moors M.D.   On: 11/28/2019 14:04   DG C-Arm 1-60 Min-No Report  Result Date: 11/16/2019 Fluoroscopy was utilized  by the requesting physician.  No radiographic interpretation.   MR ABDOMEN MRCP W WO CONTAST  Result Date: 11/14/2019 CLINICAL DATA:  Cholelithiasis and biliary ductal dilatation on recent ultrasound. EXAM: MRI ABDOMEN WITHOUT AND WITH CONTRAST (INCLUDING MRCP) TECHNIQUE: Multiplanar multisequence MR imaging of the abdomen was performed both before and after the administration of intravenous contrast. Heavily T2-weighted images of the biliary and pancreatic ducts were obtained, and three-dimensional MRCP images were rendered by post processing. CONTRAST:  66mL GADAVIST GADOBUTROL 1 MMOL/ML IV SOLN COMPARISON:  Ultrasound on 11/14/2019 and noncontrast CT on 08/02/2018 FINDINGS: Lower chest: No acute findings. 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 Hepatobiliary: No hepatic masses identified. Gallbladder is dilated and contains tiny gallstones. Mild diffuse gallbladder wall thickening is seen, without pericholecystic inflammatory changes or fluid. Diffuse biliary ductal  dilatation is new since previous study. Abrupt stricture of the distal common bile duct is seen in the pancreatic head. Pancreas: An irregular lesion with both solid and cystic areas is seen in the pancreatic head which measures 2.3 x 2.2 cm on image 47/17. A cystic lesion is also seen in the pancreatic head and neck which measures 2.7 by 2.5 cm on image 21/4. These findings are suspicious for pancreatic carcinoma. Mild pancreatic ductal dilatation noted in the pancreatic body and tail. Spleen:  Within normal limits in size and appearance. Adrenals/Urinary Tract: No masses identified. No evidence of hydronephrosis. Stomach/Bowel: Visualized portion unremarkable. Vascular/Lymphatic: 11 mm peripancreatic lymph node is seen in the porta hepatis and 10 mm portacaval lymph node is also demonstrated (image 15/8). No other sites of lymphadenopathy identified within the abdomen. No abdominal aortic aneurysm. Other:  None. Musculoskeletal:  No suspicious bone lesions identified. IMPRESSION: New diffuse biliary ductal dilatation, with abrupt stricture of the distal common bile duct in the pancreatic head. 3 cm irregular predominantly solid lesion with cystic areas in the pancreatic head, highly suspicious for pancreatic carcinoma. Consider endoscopic ultrasound for further evaluation. Mild lymphadenopathy in the porta hepatis and portacaval region, suspicious for metastatic disease. Cholelithiasis and mild diffuse gallbladder wall thickening. Although this may be due to biliary obstruction, acute cholecystitis cannot definitely be excluded. Electronically Signed   By: Marlaine Hind M.D.   On: 11/14/2019 17:58   CT PANCREAS ABD W/WO  Result Date: 12/11/2019 CLINICAL DATA:  Pancreatic cancer staging, ERCP 11/16/2019 EXAM: CT ABDOMEN WITHOUT AND WITH CONTRAST TECHNIQUE: Multidetector CT imaging of the abdomen was performed following the standard protocol before and following the bolus administration of intravenous contrast.  CONTRAST:  171mL OMNIPAQUE IOHEXOL 300 MG/ML  SOLN COMPARISON:  MR abdomen, 11/14/2019, CT abdomen pelvis, 08/02/2018 FINDINGS: Lower chest: No acute abnormality. Hepatobiliary: No focal liver abnormality is seen. Distended gallbladder containing multiple small gallstones. Persistent although reduced intra and extrahepatic biliary ductal dilatation following common bile duct stent placement. There is post stenting pneumobilia. Pancreas: Redemonstrated, ill-defined hypodense lesion of the superior pancreatic head, difficult to discretely measure although approximately 2.0 x 2.0 cm (series 14, image 21). This is traversed by the common bile duct and a common bile duct stent. There is an additional cystic lesion in the pancreatic head and neck measuring approximately 2.5 x 1.5 cm (series 14, image 24). The pancreatic duct is nearly effaced in the central pancreatic head (series 14, image 23). There is mild dilatation of the pancreatic duct in the neck up to 5 mm. Primary lesion appears to be well separated from adjacent vascular structures with the possible exception of the peripheral portion  of the portal vein which may abut along a small segment of the anterior surface of the vessel (series 14, image 16). Spleen: Normal in size without focal abnormality. Adrenals/Urinary Tract: Adrenal glands are unremarkable. Kidneys are normal, without renal calculi, focal lesion, or hydronephrosis. Stomach/Bowel: Stomach is within normal limits. Appendix appears normal. No evidence of bowel wall thickening, distention, or inflammatory changes. Vascular/Lymphatic: Aortic atherosclerosis. No enlarged abdominal lymph nodes. Other: No abdominal wall hernia or abnormality. Musculoskeletal: No acute or significant osseous findings. IMPRESSION: 1. Redemonstrated, ill-defined hypodense lesion of the superior pancreatic head, difficult to discretely measure although approximately 2.0 x 2.0 cm. This is traversed by the common bile duct and  a common bile duct stent. 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 2. There is an additional cystic lesion in the pancreatic head and neck measuring approximately 2.5 x 1.5 cm. This is of uncertain significance and may reflect a pseudocyst or IPMN. 3. The pancreatic duct is nearly effaced in the central pancreatic head. There is mild dilatation of the pancreatic duct in the neck up to 5 mm. 4. Primary lesion appears to be well separated from adjacent vascular structures with the possible exception of the peripheral portion of the portal vein which may abut along a small segment of the anterior surface of the vessel. The celiac vessels, superior mesenteric artery, superior mesenteric vein, and splenic vessels do not appear to be involved. 5. No evidence of lymphadenopathy or metastatic disease in the abdomen. 6. Distended gallbladder with cholelithiasis. 7. Aortic Atherosclerosis (ICD10-I70.0). Electronically Signed   By: Eddie Candle M.D.   On: 12/11/2019 15:12   US Abdomen Limited RUQ  Result Date: 11/14/2019 CLINICAL DATA:  Elevated LFT EXAM: ULTRASOUND ABDOMEN LIMITED RIGHT UPPER QUADRANT COMPARISON:  CT abdomen pelvis 08/02/2018 FINDINGS: Gallbladder: Multiple gallstones measuring up to 7 mm in diameter. Negative sonographic Murphy sign. No gallbladder wall thickening or pericholecystic fluid. Common bile duct: Diameter: 13 mm.  Common bile duct is significantly dilated. Liver: No focal lesion identified. Within normal limits in parenchymal echogenicity. Portal vein is patent on color Doppler imaging with normal direction of blood flow towards the liver. Other: Negative for ascites IMPRESSION: Cholelithiasis without evidence of acute cholecystitis Common bile duct dilated at 13 mm. Possible common duct stone versus obstructing mass lesion. Electronically Signed   By: Franchot Gallo M.D.   On: 11/14/2019 13:39    Labs:  CBC: Recent Labs    11/14/19 0918 11/21/19 1449  12/04/19 1327  WBC 6.5 8.4 5.9  HGB 11.0* 10.9* 10.2*  HCT 34.0* 32.9* 31.5*  PLT 500* 589* 418*    COAGS: Recent Labs    11/15/19 0537  INR 1.0    BMP: Recent Labs    11/16/19 0631 11/17/19 0722 11/21/19 1449 12/04/19 1327  NA 135 131* 133* 136  K 3.3* 3.7 4.2 3.6  CL 94* 91* 91* 102  CO2 31 28 30 26   GLUCOSE 122* 173* 169* 126*  BUN 9 12 15  7*  CALCIUM 9.3 9.6 9.0 8.7*  CREATININE 0.91 0.84 1.02* 0.90  GFRNONAA >60 >60 57* >60  GFRAA >60 >60 >60 >60    LIVER FUNCTION TESTS: Recent Labs    11/16/19 0631 11/17/19 0722 11/21/19 1449 12/04/19 1327  BILITOT 7.3* 3.4* 1.8* 0.9  AST 323* 324* 45* 19  ALT 314* 387* 137* 22  ALKPHOS 704* 704* 405* 156*  PROT 7.6 7.7 8.3* 7.3  ALBUMIN 3.2* 3.2* 3.4* 3.5    Assessment and Plan:  64  y.o. female outpatient. History of HTN, anemia, DM, newly diagnosed pancreatic adenocarcinoma borderline resectable when being worked up for elevated liver enzymes  Team is requesting a portacath placement for chemotherapy access.     All pertinent labs from 9.7.21 and medications are within normal parameters.  PET from 9.1.21  Risks and benefits of image guided port-a-catheter placement was discussed with the patient including, but not limited to bleeding, infection, pneumothorax, or fibrin sheath development and need for additional procedures.  All of the patient's questions were answered, patient is agreeable to proceed. Consent signed and in chart.   Thank you for this interesting consult.  I greatly enjoyed meeting Robin Daniels and look forward to participating in their care.  A copy of this report was sent to the requesting provider on this date.  Electronically Signed: Jacqualine Mau, NP 12/14/2019, 10:29 AM   I spent a total of  40 Minutes   in face to face in clinical consultation, greater than 50% of which was counseling/coordinating care for portacath placement

## 2019-12-17 ENCOUNTER — Other Ambulatory Visit: Payer: Self-pay

## 2019-12-17 ENCOUNTER — Inpatient Hospital Stay (HOSPITAL_BASED_OUTPATIENT_CLINIC_OR_DEPARTMENT_OTHER): Payer: Medicare Other | Admitting: Oncology

## 2019-12-17 ENCOUNTER — Inpatient Hospital Stay: Payer: Medicare Other

## 2019-12-17 DIAGNOSIS — C250 Malignant neoplasm of head of pancreas: Secondary | ICD-10-CM | POA: Diagnosis not present

## 2019-12-17 MED ORDER — ONDANSETRON HCL 8 MG PO TABS: 8.0000 mg | ORAL_TABLET | Freq: Two times a day (BID) | ORAL | 1 refills | Status: DC | PRN

## 2019-12-17 MED ORDER — PROCHLORPERAZINE MALEATE 10 MG PO TABS: 10.0000 mg | ORAL_TABLET | Freq: Four times a day (QID) | ORAL | 1 refills | Status: DC | PRN

## 2019-12-17 MED ORDER — LIDOCAINE-PRILOCAINE 2.5-2.5 % EX CREA: TOPICAL_CREAM | CUTANEOUS | 3 refills | Status: DC

## 2019-12-17 NOTE — Progress Notes (Signed)
[place]  [phone] [phone]  Patient Care Team: [place] as PCP - General [name], [place], [place] (Family Medicine) Rico Junker, RN as Registered Nurse Theodore Demark, RN as Registered Nurse Clent Jacks, RN as Oncology Nurse Navigator   Name of the patient: Robin Daniels  [id number]  [birth year]   Date of visit: 12/17/19  Diagnosis-pancreatic cancer  Chief complaint/Reason for visit- Initial Meeting for [facility] preparing for starting chemotherapy  Heme/Onc history:  Oncology History Overview Note  #Pancreas adenocarcinoma-Stage IB- [id number]-[EUS- Dr.Spaete; [facility]/GI; mass 22x22mm; invading/abutting superior mesenteric vein; 2 enlarged lymph nodes peripancreatic/porta hepatis largest 10 x 5.8 mm-nonpathologic based on EUS criteria/no biopsy]-borderline resectable.  PET scan no evidence of distant metastatic disease.  #Biliary obstruction status post ERCP and stenting [Dr.Wohl]-  # Borderline DM; HTN   # SURVIVORSHIP:   # GENETICS:   DIAGNOSIS: Pancreatic cancer  STAGE:  IB/borderline resectable;  GOALS: cure  CURRENT/MOST RECENT THERAPY : Neoadjuvant therapy    Cancer of head of pancreas ([place])  12/04/2019 Initial Diagnosis   Cancer of head of pancreas ([place])   12/19/2019 -  Chemotherapy   The patient had PACLitaxel-protein bound (ABRAXANE) chemo infusion 250 mg, 125 mg/m2, Intravenous,  Once, 0 of 4 cycles gemcitabine (GEMZAR) 1,976 mg in sodium chloride 0.9 % 250 mL chemo infusion, 1,000 mg/m2, Intravenous,  Once, 0 of 4 cycles  for chemotherapy treatment.      Interval history-Robin Daniels is a 64 year old female who presents to chemo care clinic today for initial meeting in preparation for starting chemotherapy. I introduced the chemo care clinic and we discussed that the role of the clinic is to assist those who are at an increased risk of emergency  room visits and/or complications during the course of chemotherapy treatment. We discussed that the increased risk takes into account factors such as age, performance status, and co-morbidities. We also discussed that for some, this might include barriers to care such as not having a primary care provider, lack of insurance/transportation, or not being able to afford medications. We discussed that the goal of the program is to help prevent unplanned ER visits and help reduce complications during chemotherapy. We do this by discussing specific risk factors to each individual and identifying ways that we can help improve these risk factors and reduce barriers to care.   ECOG FS:0 - Asymptomatic  Review of systems- Review of Systems  Constitutional: Negative.  Negative for chills, fever, malaise/fatigue and weight loss.  HENT: Negative for congestion, ear pain and tinnitus.   Eyes: Negative.  Negative for blurred vision and double vision.  Respiratory: Negative.  Negative for cough, sputum production and shortness of breath.   Cardiovascular: Negative.  Negative for chest pain, palpitations and leg swelling.  Gastrointestinal: Negative.  Negative for abdominal pain, constipation, diarrhea, nausea and vomiting.  Genitourinary: Negative for dysuria, frequency and urgency.  Musculoskeletal: Negative for back pain and falls.  Skin: Negative.  Negative for rash.  Neurological: Negative.  Negative for weakness and headaches.  Endo/Heme/Allergies: Negative.  Does not bruise/bleed easily.  Psychiatric/Behavioral: Negative.  Negative for depression. The patient is not nervous/anxious and does not have insomnia.      Current treatment-gemcitabine/Abraxane.  No Known Allergies  Past Medical History:  Diagnosis Date  . Anemia    history of  . Anxiety 09/11/2014  . Arthritis   . Cancer of head of pancreas ([place]) 12/04/2019  .  Diabetes mellitus without complication ([place])   . Fluttering heart   .  Hyperlipidemia   . Hypertension     Past Surgical History:  Procedure Laterality Date  . ENDOSCOPIC RETROGRADE CHOLANGIOPANCREATOGRAPHY (ERCP) WITH PROPOFOL N/A 11/16/2019   Procedure: ENDOSCOPIC RETROGRADE CHOLANGIOPANCREATOGRAPHY (ERCP) WITH PROPOFOL;  Surgeon: Lucilla Lame, MD;  Location: [facility] ENDOSCOPY;  Service: Endoscopy;  Laterality: N/A;  . EUS N/A 11/29/2019   Procedure: FULL UPPER ENDOSCOPIC ULTRASOUND (EUS) RADIAL;  Surgeon: Reita Cliche, MD;  Location: [facility] ENDOSCOPY;  Service: Gastroenterology;  Laterality: N/A;  . IR IMAGING GUIDED PORT INSERTION  12/14/2019  . right knee replacement Right [id number]  . SHOULDER ARTHROSCOPY W/ ROTATOR CUFF REPAIR Right     Social History   Socioeconomic History  . Marital status: Divorced    Spouse name: Not on file  . Number of children: Not on file  . Years of education: Not on file  . Highest education level: Not on file  Occupational History  . Not on file  Tobacco Use  . Smoking status: Never Smoker  . Smokeless tobacco: Never Used  Vaping Use  . Vaping Use: Never used  Substance and Sexual Activity  . Alcohol use: No    Alcohol/week: 0.0 standard drinks  . Drug use: No  . Sexual activity: Never  Other Topics Concern  . Not on file  Social History Narrative   Lives in [place]; with mom and son; worked in dietary; never smoked; no alcohol.    Social Determinants of Health   Financial Resource Strain:   . Difficulty of Paying Living Expenses: Not on file  Food Insecurity:   . Worried About Charity fundraiser in the Last Year: Not on file  . Ran Out of Food in the Last Year: Not on file  Transportation Needs:   . Lack of Transportation (Medical): Not on file  . Lack of Transportation (Non-Medical): Not on file  Physical Activity:   . Days of Exercise per Week: Not on file  . Minutes of Exercise per Session: Not on file  Stress:   . Feeling of Stress : Not on file  Social Connections:   . Frequency of  Communication with Friends and Family: Not on file  . Frequency of Social Gatherings with Friends and Family: Not on file  . Attends Religious Services: Not on file  . Active Member of Clubs or Organizations: Not on file  . Attends Archivist Meetings: Not on file  . Marital Status: Not on file  Intimate Partner Violence:   . Fear of Current or Ex-Partner: Not on file  . Emotionally Abused: Not on file  . Physically Abused: Not on file  . Sexually Abused: Not on file    Family History  Problem Relation Age of Onset  . Diabetes Mother   . Hyperlipidemia Mother   . Hypertension Mother   . Vision loss Mother   . Arthritis Father   . Hyperlipidemia Father   . Hypertension Father   . Breast cancer Maternal Aunt      Current Outpatient Medications:  .  acetaminophen (TYLENOL) 650 MG CR tablet, Take 1 tablet by mouth every 8 (eight) hours as needed., Disp: , Rfl:  .  amLODipine (NORVASC) 10 MG tablet, Take 10 mg by mouth daily., Disp: , Rfl:  .  metoprolol succinate (TOPROL-XL) 50 MG 24 hr tablet, Take 50 mg by mouth daily. Take with or immediately following a meal. , Disp: , Rfl:  .  Potassium Chloride ER 20 MEQ TBCR, Take 20 mEq by mouth daily. , Disp: , Rfl:  .  vitamin B-12 (CYANOCOBALAMIN) 1000 MCG tablet, Take 1 tablet (1,000 mcg total) by mouth daily., Disp: , Rfl:   Physical exam: There were no vitals filed for this visit. Physical Exam Constitutional:      Appearance: Normal appearance.  HENT:     Head: Normocephalic and atraumatic.  Eyes:     Pupils: Pupils are equal, round, and reactive to light.  Cardiovascular:     Rate and Rhythm: Normal rate and regular rhythm.     Heart sounds: Normal heart sounds. No murmur heard.   Pulmonary:     Effort: Pulmonary effort is normal.     Breath sounds: Normal breath sounds. No wheezing.  Abdominal:     General: Bowel sounds are normal. There is no distension.     Palpations: Abdomen is soft.     Tenderness: There  is no abdominal tenderness.  Musculoskeletal:        General: Normal range of motion.     Cervical back: Normal range of motion.  Skin:    General: Skin is warm and dry.     Findings: No rash.  Neurological:     Mental Status: She is alert and oriented to person, place, and time.  Psychiatric:        Judgment: Judgment normal.      CMP Latest Ref Rng & Units 12/04/2019  Glucose 70 - 99 mg/dL 126(H)  BUN 8 - 23 mg/dL 7(L)  Creatinine 0.44 - 1.00 mg/dL 0.90  Sodium 135 - 145 mmol/L 136  Potassium 3.5 - 5.1 mmol/L 3.6  Chloride 98 - 111 mmol/L 102  CO2 22 - 32 mmol/L 26  Calcium 8.9 - 10.3 mg/dL 8.7(L)  Total Protein 6.5 - 8.1 g/dL 7.3  Total Bilirubin 0.3 - 1.2 mg/dL 0.9  Alkaline Phos 38 - 126 U/L 156(H)  AST 15 - 41 U/L 19  ALT 0 - 44 U/L 22   CBC Latest Ref Rng & Units 12/04/2019  WBC 4.0 - 10.5 K/uL 5.9  Hemoglobin 12.0 - 15.0 g/dL 10.2(L)  Hematocrit 36 - 46 % 31.5(L)  Platelets 150 - 400 K/uL 418(H)    No images are attached to the encounter.  NM PET Image Initial (PI) Skull Base To Thigh  Result Date: 11/28/2019 CLINICAL DATA:  Initial treatment strategy for pancreatic mass. EXAM: NUCLEAR MEDICINE PET SKULL BASE TO THIGH TECHNIQUE: 10.96 mCi F-18 FDG was injected intravenously. Full-ring PET imaging was performed from the skull base to thigh after the radiotracer. CT data was obtained and used for attenuation correction and anatomic localization. Fasting blood glucose: 138 mg/dl COMPARISON:  MRI 11/14/2019 FINDINGS: Mediastinal blood pool activity: SUV max 3.24 Liver activity: SUV max NA NECK: Mild asymmetric increased radiotracer uptake within the right tonsillar region is identified when compared with the left. SUV max in the right tonsillar region is equal to 7.966 versus 5.91 on the contralateral side. Additionally, there is a right level 2 lymph node which exhibits an SUV max of 5.01. Incidental CT findings: none CHEST: No FDG avid supraclavicular, axillary, mediastinal  or hilar lymph nodes. No FDG avid pulmonary nodule or mass identified. There is a large fat attenuation mass overlying the posterior and lateral right lower lobe. 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 Incidental CT findings: Aortic atherosclerosis. Coronary artery calcifications. ABDOMEN/PELVIS: No focal liver abnormality  identified. Pneumobilia is identified status post common bile duct stent. Mild increased radiotracer uptake localizes to the head of pancreas surrounding the common bile duct stent with SUV max of 5.39. No FDG avid foci within the spleen or adrenal glands. No FDG avid abdominopelvic lymph nodes. The cystic lesions within head of pancreas are better seen on recent MRI. Incidental CT findings: Mild aortic atherosclerosis. Gallstones identified. SKELETON: No focal hypermetabolic activity to suggest skeletal metastasis. Incidental CT findings: none IMPRESSION: 1. Mild radiotracer uptake localizes to the head of pancreas in the area surrounding the common bile duct stent. Cannot rule out underlying pancreatic neoplasm. Consider further evaluation with endoscopic ultrasound with tissue sampling if indicated. 2. No signs of FDG avid liver metastasis or upper abdominal nodal metastasis. 3. There is asymmetric increased uptake within the right tonsillar region with a mildly FDG avid right level 2 lymph node. Findings may be related to upper respiratory tract viral infection. Underlying head neck neoplasm would be difficult to exclude. Correlate for any clinical signs or symptoms of recent upper respiratory tract viral infection. If clinically indicated contrast enhanced CT or MRI of the neck and or direct visualization is advised. Electronically Signed   By: Kerby Moors M.D.   On: 11/28/2019 14:04   CT PANCREAS ABD W/WO  Result Date: 12/11/2019 CLINICAL DATA:  Pancreatic cancer staging, ERCP 11/16/2019 EXAM: CT ABDOMEN WITHOUT  AND WITH CONTRAST TECHNIQUE: Multidetector CT imaging of the abdomen was performed following the standard protocol before and following the bolus administration of intravenous contrast. CONTRAST:  128mL OMNIPAQUE IOHEXOL 300 MG/ML  SOLN COMPARISON:  MR abdomen, 11/14/2019, CT abdomen pelvis, 08/02/2018 FINDINGS: Lower chest: No acute abnormality. Hepatobiliary: No focal liver abnormality is seen. Distended gallbladder containing multiple small gallstones. Persistent although reduced intra and extrahepatic biliary ductal dilatation following common bile duct stent placement. There is post stenting pneumobilia. Pancreas: Redemonstrated, ill-defined hypodense lesion of the superior pancreatic head, difficult to discretely measure although approximately 2.0 x 2.0 cm (series 14, image 21). This is traversed by the common bile duct and a common bile duct stent. There is an additional cystic lesion in the pancreatic head and neck measuring approximately 2.5 x 1.5 cm (series 14, image 24). The pancreatic duct is nearly effaced in the central pancreatic head (series 14, image 23). There is mild dilatation of the pancreatic duct in the neck up to 5 mm. Primary lesion appears to be well separated from adjacent vascular structures with the possible exception of the peripheral portion of the portal vein which may abut along a small segment of the anterior surface of the vessel (series 14, image 16). Spleen: Normal in size without focal abnormality. Adrenals/Urinary Tract: Adrenal glands are unremarkable. Kidneys are normal, without renal calculi, focal lesion, or hydronephrosis. Stomach/Bowel: Stomach is within normal limits. Appendix appears normal. No evidence of bowel wall thickening, distention, or inflammatory changes. Vascular/Lymphatic: Aortic atherosclerosis. No enlarged abdominal lymph nodes. Other: No abdominal wall hernia or abnormality. Musculoskeletal: No acute or significant osseous findings. IMPRESSION: 1.  Redemonstrated, ill-defined hypodense lesion of the superior pancreatic head, difficult to discretely measure although approximately 2.0 x 2.0 cm. This is traversed by the common bile duct and a common bile duct stent. 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 2. There is an additional cystic lesion in the pancreatic head and neck measuring approximately 2.5 x 1.5 cm. This is of uncertain significance and may reflect a pseudocyst or IPMN. 3. The pancreatic duct is nearly effaced in the central  pancreatic head. There is mild dilatation of the pancreatic duct in the neck up to 5 mm. 4. Primary lesion appears to be well separated from adjacent vascular structures with the possible exception of the peripheral portion of the portal vein which may abut along a small segment of the anterior surface of the vessel. The celiac vessels, superior mesenteric artery, superior mesenteric vein, and splenic vessels do not appear to be involved. 5. No evidence of lymphadenopathy or metastatic disease in the abdomen. 6. Distended gallbladder with cholelithiasis. 7. Aortic Atherosclerosis (ICD10-I70.0). Electronically Signed   By: Eddie Candle M.D.   On: 12/11/2019 15:12   IR IMAGING GUIDED PORT INSERTION  Result Date: 12/14/2019 CLINICAL DATA:  Pancreas carcinoma, access for chemotherapy EXAM: RIGHT INTERNAL JUGULAR SINGLE LUMEN POWER PORT CATHETER INSERTION Date:  12/14/2019 12/14/2019 11:52 am Radiologist:  Jerilynn Mages. Daryll Brod, MD Guidance:  Ultrasound and fluoroscopic MEDICATIONS: Ancef 2 g; The antibiotic was administered within an appropriate time interval prior to skin puncture. ANESTHESIA/SEDATION: Versed 2.0 mg IV; Fentanyl 100 mcg IV; Moderate Sedation Time:  23 minutes The patient was continuously monitored during the procedure by the interventional radiology nurse under my direct supervision. FLUOROSCOPY TIME:  0 minutes, 30 seconds (3.8 mGy) COMPLICATIONS: None immediate. CONTRAST:  None PROCEDURE:  Informed consent was obtained from the patient following explanation of the procedure, risks, benefits and alternatives. The patient understands, agrees and consents for the procedure. All questions were addressed. A time out was performed. Maximal barrier sterile technique utilized including caps, mask, sterile gowns, sterile gloves, large sterile drape, hand hygiene, and 2% chlorhexidine scrub. Under sterile conditions and local anesthesia, right internal jugular micropuncture venous access was performed. Access was performed with ultrasound. Images were obtained for documentation of the patent right internal jugular vein. A guide wire was inserted followed by a transitional dilator. This allowed insertion of a guide wire and catheter into the IVC. Measurements were obtained from the SVC / RA junction back to the right IJ venotomy site. In the right infraclavicular chest, a subcutaneous pocket was created over the second anterior rib. This was done under sterile conditions and local anesthesia. 1% lidocaine with epinephrine was utilized for this. A 2.5 cm incision was made in the skin. Blunt dissection was performed to create a subcutaneous pocket over the right pectoralis major muscle. The pocket was flushed with saline vigorously. There was adequate hemostasis. The port catheter was assembled and checked for leakage. The port catheter was secured in the pocket with two retention sutures. The tubing was tunneled subcutaneously to the right venotomy site and inserted into the SVC/RA junction through a valved peel-away sheath. Position was confirmed with fluoroscopy. Images were obtained for documentation. The patient tolerated the procedure well. No immediate complications. Incisions were closed in a two layer fashion with 4 - 0 Vicryl suture. Dermabond was applied to the skin. The port catheter was accessed, blood was aspirated followed by saline and heparin flushes. Needle was removed. A dry sterile dressing was  applied. IMPRESSION: Ultrasound and fluoroscopically guided right internal jugular single lumen power port catheter insertion. Tip in the SVC/RA junction. Catheter ready for use. Electronically Signed   By: Jerilynn Mages.  Shick M.D.   On: 12/14/2019 11:54     Assessment and plan- Patient is a 64 y.o. female who presents to [facility] for initial meeting in preparation for starting chemotherapy for the treatment of stage I pancreatic cancer.   1. HPI: Mrs. Leach is a 64 year old female with past medical  history significant for diabetes and hypertension who was recently diagnosed with a pancreatic mass.  She reports dark-colored urine for the past few weeks which led to evaluation by her PCP revealing elevated LFTs.  She was sent to the hospital for further work-up where patient had MRCP which showed a pancreatic mass biliary dilatation for which she underwent ERCP with stenting.  PET scan did not reveal any evidence of metastatic disease.  CT abdomen showed that the mass was invading/abutting the superior mesenteric vein with 2 enlarged lymph nodes making this borderline resectable.  She has a referral with Dr. Barry Dienes in the next couple weeks.  Given the abutment of the SM[facility], Dr. Rogue Bussing has recommended neoadjuvant chemotherapy with gemcitabine and Abraxane.  2. Chemo Care Clinic/High Risk for ER/Hospitalization during chemotherapy- We discussed the role of the chemo care clinic and identified patient specific risk factors. I discussed that patient was identified as high risk primarily based on: age and comorbidities  Patient has past medical history positive for: Past Medical History:  Diagnosis Date  . Anemia    history of  . Anxiety 09/11/2014  . Arthritis   . Cancer of head of pancreas ([place]) 12/04/2019  . Diabetes mellitus without complication ([place])   . Fluttering heart   . Hyperlipidemia   . Hypertension     Patient has past surgical history positive for: Past Surgical History:  Procedure  Laterality Date  . ENDOSCOPIC RETROGRADE CHOLANGIOPANCREATOGRAPHY (ERCP) WITH PROPOFOL N/A 11/16/2019   Procedure: ENDOSCOPIC RETROGRADE CHOLANGIOPANCREATOGRAPHY (ERCP) WITH PROPOFOL;  Surgeon: Lucilla Lame, MD;  Location: [facility] ENDOSCOPY;  Service: Endoscopy;  Laterality: N/A;  . EUS N/A 11/29/2019   Procedure: FULL UPPER ENDOSCOPIC ULTRASOUND (EUS) RADIAL;  Surgeon: Reita Cliche, MD;  Location: [facility] ENDOSCOPY;  Service: Gastroenterology;  Laterality: N/A;  . IR IMAGING GUIDED PORT INSERTION  12/14/2019  . right knee replacement Right [id number]  . SHOULDER ARTHROSCOPY W/ ROTATOR CUFF REPAIR Right     Based on our high risk symptom management report; this patient has a high risk of ED utilization.  The percentage below indicates how "at risk "  this patient based on the factors in this table within one year.   General Risk Score: 8  Values used to calculate this score:   Points  Metrics      1        Age: 89      [facility] Admissions: 3      3        ED Visits: 3      0        Has Chronic Obstructive Pulmonary Disease: No      0        Has Diabetes: No      0        Has Congestive Heart Failure: No      0        Has liver disease: No      0        Has Depression: No      0        Current PCP: [place]      1        Has Medicaid: Yes    3. We discussed that social determinants of health may have significant impacts on health and outcomes for cancer patients.  Today we discussed specific social determinants of performance status, alcohol use, depression, financial needs, food insecurity,  housing, interpersonal violence, social connections, stress, tobacco use, and transportation.    After lengthy discussion the following were identified as areas of need:   Outpatient services: We discussed options including home based and outpatient services, DME and care program. We discusssed that patients who participate in regular physical activity report fewer negative  impacts of cancer and treatments and report less fatigue.   Financial Concerns: We discussed that living with cancer can create tremendous financial burden.  We discussed options for assistance. I asked that if assistance is needed in affording medications or paying bills to please let us know so that we can provide assistance. We discussed options for food including social services, [employer] ($50 every 2 weeks) and onsite food pantry.  We will also notify [facility] to see if cancer center can provide additional support.  Referral to Social work: Introduced Education officer, museum Elease Etienne and the services he can provide such as support with [employer], cell phone and gas vouchers.   Support groups: We discussed options for support groups at the cancer center. If interested, please notify nurse navigator to enroll. We discussed options for managing stress including healthy eating, exercise as well as participating in no charge counseling services at the cancer center and support groups.  If these are of interest, patient can notify either myself or primary nursing team.We discussed options for management including medications and referral to quit Smart program  Transportation: We discussed options for transportation including acta, paratransit, bus routes, link transit, taxi/uber/lyft, and cancer center [place].  I have notified primary oncology team who will help assist with arranging Lucianne Lei transportation for appointments when/if needed. We also discussed options for transportation on short notice/acute visits.  Palliative care services: We have palliative care services available in the cancer center to discuss goals of care and advanced care planning.  Please let us know if you have any questions or would like to speak to our palliative nurse practitioner.  Symptom Management Clinic: We discussed our symptom management clinic which is available for acute concerns while receiving treatment such as  nausea, vomiting or diarrhea.  We can be reached via telephone at [phone] or through my chart.  We are available for virtual or in person visits on the same day from 830 to 4 PM Monday through Friday. She denies needing specific assistance at this time and She will be followed by Dr. Rogue Bussing clinical team.  Plan: Discussed symptom management clinic. Discussed palliative care services. Discussed resources that are available here at the cancer center. Discussed medications and new prescriptions to begin treatment such as anti-nausea or steroids.   Disposition: RTC on 12/19/2019 for lab work, MD assessment and cycle 1 of chemotherapy  Visit Diagnosis 1. Cancer of head of pancreas [facility])     Patient expressed understanding and was in agreement with this plan. She also understands that She can call clinic at any time with any questions, concerns, or complaints.   Greater than 50% was spent in counseling and coordination of care with this patient including but not limited to discussion of the relevant topics above (See A&P) including, but not limited to diagnosis and management of acute and chronic medical conditions.   [facility] [place]  CC: Dr. Rogue Bussing

## 2019-12-18 ENCOUNTER — Encounter: Payer: Self-pay | Admitting: Internal Medicine

## 2019-12-18 ENCOUNTER — Other Ambulatory Visit: Payer: Self-pay

## 2019-12-19 ENCOUNTER — Inpatient Hospital Stay: Payer: Medicare Other

## 2019-12-19 ENCOUNTER — Inpatient Hospital Stay (HOSPITAL_BASED_OUTPATIENT_CLINIC_OR_DEPARTMENT_OTHER): Payer: Medicare Other | Admitting: Internal Medicine

## 2019-12-19 ENCOUNTER — Encounter: Payer: Self-pay | Admitting: Internal Medicine

## 2019-12-19 DIAGNOSIS — C250 Malignant neoplasm of head of pancreas: Secondary | ICD-10-CM

## 2019-12-19 DIAGNOSIS — Z5111 Encounter for antineoplastic chemotherapy: Secondary | ICD-10-CM | POA: Diagnosis not present

## 2019-12-19 LAB — COMPREHENSIVE METABOLIC PANEL
ALT: 50 U/L — ABNORMAL HIGH (ref 0–44)
AST: 35 U/L (ref 15–41)
Albumin: 3.5 g/dL (ref 3.5–5.0)
Alkaline Phosphatase: 294 U/L — ABNORMAL HIGH (ref 38–126)
Anion gap: 11 (ref 5–15)
BUN: 8 mg/dL (ref 8–23)
CO2: 26 mmol/L (ref 22–32)
Calcium: 8.9 mg/dL (ref 8.9–10.3)
Chloride: 102 mmol/L (ref 98–111)
Creatinine, Ser: 0.89 mg/dL (ref 0.44–1.00)
GFR calc Af Amer: >60 mL/min (ref >60)
GFR calc non Af Amer: >60 mL/min (ref >60)
Glucose, Bld: 168 mg/dL — ABNORMAL HIGH (ref 70–99)
Potassium: 3.2 mmol/L — ABNORMAL LOW (ref 3.5–5.1)
Sodium: 139 mmol/L (ref 135–145)
Total Bilirubin: 0.7 mg/dL (ref 0.3–1.2)
Total Protein: 7.6 g/dL (ref 6.5–8.1)

## 2019-12-19 LAB — CBC WITH DIFFERENTIAL/PLATELET
Abs Immature Granulocytes: 0.02 10*3/uL (ref 0.00–0.07)
Basophils Absolute: 0 10*3/uL (ref 0.0–0.1)
Basophils Relative: 1 %
Eosinophils Absolute: 0.2 10*3/uL (ref 0.0–0.5)
Eosinophils Relative: 3 %
HCT: 32.7 % — ABNORMAL LOW (ref 36.0–46.0)
Hemoglobin: 10.7 g/dL — ABNORMAL LOW (ref 12.0–15.0)
Immature Granulocytes: 0 %
Lymphocytes Relative: 28 %
Lymphs Abs: 1.7 10*3/uL (ref 0.7–4.0)
MCH: 23.5 pg — ABNORMAL LOW (ref 26.0–34.0)
MCHC: 32.7 g/dL (ref 30.0–36.0)
MCV: 71.7 fL — ABNORMAL LOW (ref 80.0–100.0)
Monocytes Absolute: 0.3 10*3/uL (ref 0.1–1.0)
Monocytes Relative: 5 %
Neutro Abs: 3.9 10*3/uL (ref 1.7–7.7)
Neutrophils Relative %: 63 %
Platelets: 358 10*3/uL (ref 150–400)
RBC: 4.56 MIL/uL (ref 3.87–5.11)
RDW: 15.9 % — ABNORMAL HIGH (ref 11.5–15.5)
WBC: 6.2 10*3/uL (ref 4.0–10.5)
nRBC: 0 % (ref 0.0–0.2)

## 2019-12-19 MED ORDER — HEPARIN SOD (PORK) LOCK FLUSH 100 UNIT/ML IV SOLN
INTRAVENOUS | Status: AC
  Filled 2019-12-19: qty 5

## 2019-12-19 MED ORDER — SODIUM CHLORIDE 0.9 % IV SOLN
2000.0000 mg | Freq: Once | INTRAVENOUS | Status: AC
  Administered 2019-12-19: 2000 mg via INTRAVENOUS
  Filled 2019-12-19: qty 52.6

## 2019-12-19 MED ORDER — SODIUM CHLORIDE 0.9% FLUSH
10.0000 mL | INTRAVENOUS | Status: DC | PRN
  Administered 2019-12-19: 10 mL via INTRAVENOUS
  Filled 2019-12-19: qty 10

## 2019-12-19 MED ORDER — PACLITAXEL PROTEIN-BOUND CHEMO INJECTION 100 MG
125.0000 mg/m2 | Freq: Once | INTRAVENOUS | Status: AC
  Administered 2019-12-19: 250 mg via INTRAVENOUS
  Filled 2019-12-19: qty 50

## 2019-12-19 MED ORDER — SODIUM CHLORIDE 0.9 % IV SOLN
Freq: Once | INTRAVENOUS | Status: AC
  Filled 2019-12-19: qty 250

## 2019-12-19 MED ORDER — HEPARIN SOD (PORK) LOCK FLUSH 100 UNIT/ML IV SOLN
500.0000 [IU] | Freq: Once | INTRAVENOUS | Status: AC
  Administered 2019-12-19: 500 [IU] via INTRAVENOUS
  Filled 2019-12-19: qty 5

## 2019-12-19 MED ORDER — PROCHLORPERAZINE MALEATE 10 MG PO TABS
10.0000 mg | ORAL_TABLET | Freq: Once | ORAL | Status: AC
  Administered 2019-12-19: 10 mg via ORAL
  Filled 2019-12-19: qty 1

## 2019-12-19 NOTE — Assessment & Plan Note (Addendum)
#    Pancreas adenocarcinoma-Stage IB-/boderline resectable ;Given the abutment of the SMV-proceed with neoadjuvant chemotherapy- gemcitabine/Abraxane. Discussed with Dr. Barry Dienes. Awaiting appointment next week.  # proceed with Gem-Abraxane cycle #1; day-1 today. Labs today reviewed;  acceptable for treatment today.   Discussed the potential side effects including but not limited to-increasing fatigue, nausea vomiting, diarrhea, hair loss, sores in the mouth, increase risk of infection and also neuropathy.   #Biliary obstruction status post ERCP and stenting-secondary to above malignancy-improved however today elevated alkaline phosphatase-157/normal bilirubin. Monitor for now-  # DISPOSITION: # chemo today # 1 week; labs- cbc/cmp Gem-Abraxane # in 2 weeks-MD; labs- cbc.cmp;ca-19-9; Gem-Abarxane-dr.B

## 2019-12-19 NOTE — Progress Notes (Signed)
[place] NOTE  Patient Care Team: [place] as PCP - General [name], [place], [place] (Family Medicine) Rico Junker, RN as Registered Nurse Theodore Demark, RN as Registered Nurse Clent Jacks, RN as Oncology Nurse Navigator  CHIEF COMPLAINTS/PURPOSE OF CONSULTATION: pancreas adenocarcinoma    Oncology History Overview Note  #Pancreas adenocarcinoma-Stage IB- [id number]-[EUS- Dr.Spaete; [facility]/GI; mass 22x28mm; invading/abutting superior mesenteric vein; 2 enlarged lymph nodes peripancreatic/porta hepatis largest 10 x 5.8 mm-nonpathologic based on EUS criteria/no biopsy]-borderline resectable.  PET scan no evidence of distant metastatic disease.  #Biliary obstruction status post ERCP and stenting [Dr.Wohl]-  # SEP, 22,2021- GEM-ABRAXANE  # Borderline DM; HTN   # SURVIVORSHIP:   # GENETICS:   DIAGNOSIS: Pancreatic cancer  STAGE:  IB/borderline resectable;  GOALS: cure  CURRENT/MOST RECENT THERAPY : Neoadjuvant therapy-GEM-ABRAXANE    Cancer of head of pancreas ([place])  12/04/2019 Initial Diagnosis   Cancer of head of pancreas ([place])   12/19/2019 -  Chemotherapy   The patient had PACLitaxel-protein bound (ABRAXANE) chemo infusion 250 mg, 125 mg/m2 = 250 mg, Intravenous,  Once, 1 of 4 cycles Administration: 250 mg (12/19/2019) gemcitabine (GEMZAR) 2,000 mg in sodium chloride 0.9 % 250 mL chemo infusion, 1,976 mg, Intravenous,  Once, 1 of 4 cycles Administration: 2,000 mg (12/19/2019)  for chemotherapy treatment.       HISTORY OF PRESENTING ILLNESS:  Robin Daniels 64 y.o.  female newly diagnosed pancreatic adeno ca is here for a follow up/ proceed with chemo/review CT scan.  In the interim underwent Mediport placement.  Patient is awaiting to meet with Dr. Barry Dienes; on September 27.  Otherwise appetite is fair. No nausea no vomiting. No fevers or chills.  Review of Systems  Constitutional: Positive for weight loss. Negative  for chills, diaphoresis, fever and malaise/fatigue.  HENT: Negative for nosebleeds and sore throat.   Eyes: Negative for double vision.  Respiratory: Negative for cough, hemoptysis, sputum production, shortness of breath and wheezing.   Cardiovascular: Negative for chest pain, palpitations, orthopnea and leg swelling.  Gastrointestinal: Negative for abdominal pain, blood in stool, constipation, diarrhea, heartburn, melena, nausea and vomiting.  Genitourinary: Negative for dysuria, frequency and urgency.  Musculoskeletal: Negative for back pain and joint pain.  Skin: Negative.  Negative for itching and rash.  Neurological: Negative for dizziness, tingling, focal weakness, weakness and headaches.  Endo/Heme/Allergies: Does not bruise/bleed easily.  Psychiatric/Behavioral: Negative for depression. The patient is not nervous/anxious and does not have insomnia.      MEDICAL HISTORY:  Past Medical History:  Diagnosis Date  . Anemia    history of  . Anxiety 09/11/2014  . Arthritis   . Cancer of head of pancreas ([place]) 12/04/2019  . Diabetes mellitus without complication ([place])   . Fluttering heart   . Hyperlipidemia   . Hypertension     SURGICAL HISTORY: Past Surgical History:  Procedure Laterality Date  . ENDOSCOPIC RETROGRADE CHOLANGIOPANCREATOGRAPHY (ERCP) WITH PROPOFOL N/A 11/16/2019   Procedure: ENDOSCOPIC RETROGRADE CHOLANGIOPANCREATOGRAPHY (ERCP) WITH PROPOFOL;  Surgeon: Lucilla Lame, MD;  Location: [facility] ENDOSCOPY;  Service: Endoscopy;  Laterality: N/A;  . EUS N/A 11/29/2019   Procedure: FULL UPPER ENDOSCOPIC ULTRASOUND (EUS) RADIAL;  Surgeon: Reita Cliche, MD;  Location: [facility] ENDOSCOPY;  Service: Gastroenterology;  Laterality: N/A;  . IR IMAGING GUIDED PORT INSERTION  12/14/2019  . right knee replacement Right [id number]  . SHOULDER ARTHROSCOPY W/ ROTATOR CUFF REPAIR Right     SOCIAL HISTORY: Social History   Socioeconomic  History  . Marital status: Divorced    Spouse name: Not  on file  . Number of children: Not on file  . Years of education: Not on file  . Highest education level: Not on file  Occupational History  . Not on file  Tobacco Use  . Smoking status: Never Smoker  . Smokeless tobacco: Never Used  Vaping Use  . Vaping Use: Never used  Substance and Sexual Activity  . Alcohol use: No    Alcohol/week: 0.0 standard drinks  . Drug use: No  . Sexual activity: Never  Other Topics Concern  . Not on file  Social History Narrative   Lives in [place]; with mom and son; worked in dietary; never smoked; no alcohol.    Social Determinants of Health   Financial Resource Strain:   . Difficulty of Paying Living Expenses: Not on file  Food Insecurity:   . Worried About Charity fundraiser in the Last Year: Not on file  . Ran Out of Food in the Last Year: Not on file  Transportation Needs:   . Lack of Transportation (Medical): Not on file  . Lack of Transportation (Non-Medical): Not on file  Physical Activity:   . Days of Exercise per Week: Not on file  . Minutes of Exercise per Session: Not on file  Stress:   . Feeling of Stress : Not on file  Social Connections:   . Frequency of Communication with Friends and Family: Not on file  . Frequency of Social Gatherings with Friends and Family: Not on file  . Attends Religious Services: Not on file  . Active Member of Clubs or Organizations: Not on file  . Attends Archivist Meetings: Not on file  . Marital Status: Not on file  Intimate Partner Violence:   . Fear of Current or Ex-Partner: Not on file  . Emotionally Abused: Not on file  . Physically Abused: Not on file  . Sexually Abused: Not on file    FAMILY HISTORY: Family History  Problem Relation Age of Onset  . Diabetes Mother   . Hyperlipidemia Mother   . Hypertension Mother   . Vision loss Mother   . Arthritis Father   . Hyperlipidemia Father   . Hypertension Father   . Breast cancer Maternal Aunt     ALLERGIES:  has No  Known Allergies.  MEDICATIONS:  Current Outpatient Medications  Medication Sig Dispense Refill  . acetaminophen (TYLENOL) 650 MG CR tablet Take 1 tablet by mouth every 8 (eight) hours as needed.    Marland Kitchen amLODipine (NORVASC) 10 MG tablet Take 10 mg by mouth daily.    Marland Kitchen lidocaine-prilocaine (EMLA) cream Apply to affected area once 30 g 3  . metoprolol succinate (TOPROL-XL) 50 MG 24 hr tablet Take 50 mg by mouth daily. Take with or immediately following a meal.     . ondansetron (ZOFRAN) 8 MG tablet Take 1 tablet (8 mg total) by mouth 2 (two) times daily as needed (Nausea or vomiting). 30 tablet 1  . Potassium Chloride ER 20 MEQ TBCR Take 20 mEq by mouth daily.     . prochlorperazine (COMPAZINE) 10 MG tablet Take 1 tablet (10 mg total) by mouth every 6 (six) hours as needed (Nausea or vomiting). 30 tablet 1  . vitamin B-12 (CYANOCOBALAMIN) 1000 MCG tablet Take 1 tablet (1,000 mcg total) by mouth daily.     No current facility-administered medications for this visit.   Facility-Administered Medications Ordered in Other Visits  Medication Dose Route Frequency Provider Last Rate Last Admin  . sodium chloride flush (NS) 0.9 % injection 10 mL  10 mL Intravenous PRN Cammie Sickle, MD   10 mL at 12/19/19 0833      .  PHYSICAL EXAMINATION: ECOG PERFORMANCE STATUS: 1 - Symptomatic but completely ambulatory  Vitals:   12/19/19 0846  BP: (!) 159/80  Pulse: 87  Resp: 16  Temp: (!) 96.3 F (35.7 C)  SpO2: 100%   Filed Weights   12/19/19 0846  Weight: 191 lb (86.6 kg)    Physical Exam HENT:     Head: Normocephalic and atraumatic.     Mouth/Throat:     Pharynx: No oropharyngeal exudate.  Eyes:     Pupils: Pupils are equal, round, and reactive to light.  Cardiovascular:     Rate and Rhythm: Normal rate and regular rhythm.  Pulmonary:     Effort: Pulmonary effort is normal. No respiratory distress.     Breath sounds: Normal breath sounds. No wheezing.  Abdominal:     General:  Bowel sounds are normal. There is no distension.     Palpations: Abdomen is soft. There is no mass.     Tenderness: There is no abdominal tenderness. There is no guarding or rebound.  Musculoskeletal:        General: No tenderness. Normal range of motion.     Cervical back: Normal range of motion and neck supple.  Skin:    General: Skin is warm.  Neurological:     Mental Status: She is alert and oriented to person, place, and time.  Psychiatric:        Mood and Affect: Affect normal.      LABORATORY DATA:  I have reviewed the data as listed Lab Results  Component Value Date   WBC 6.2 12/19/2019   HGB 10.7 (L) 12/19/2019   HCT 32.7 (L) 12/19/2019   MCV 71.7 (L) 12/19/2019   PLT 358 12/19/2019   Recent Labs    11/21/19 1449 12/04/19 1327 12/19/19 0833  NA 133* 136 139  K 4.2 3.6 3.2*  CL 91* 102 102  CO2 30 26 26   GLUCOSE 169* 126* 168*  BUN 15 7* 8  CREATININE 1.02* 0.90 0.89  CALCIUM 9.0 8.7* 8.9  GFRNONAA 57* >60 >60  GFRAA >60 >60 >60  PROT 8.3* 7.3 7.6  ALBUMIN 3.4* 3.5 3.5  AST 45* 19 35  ALT 137* 22 50*  ALKPHOS 405* 156* 294*  BILITOT 1.8* 0.9 0.7    RADIOGRAPHIC STUDIES: I have personally reviewed the radiological images as listed and agreed with the findings in the report. NM PET Image Initial (PI) Skull Base To Thigh  Result Date: 11/28/2019 CLINICAL DATA:  Initial treatment strategy for pancreatic mass. EXAM: NUCLEAR MEDICINE PET SKULL BASE TO THIGH TECHNIQUE: 10.96 mCi F-18 FDG was injected intravenously. Full-ring PET imaging was performed from the skull base to thigh after the radiotracer. CT data was obtained and used for attenuation correction and anatomic localization. Fasting blood glucose: 138 mg/dl COMPARISON:  MRI 11/14/2019 FINDINGS: Mediastinal blood pool activity: SUV max 3.24 Liver activity: SUV max NA NECK: Mild asymmetric increased radiotracer uptake within the right tonsillar region is identified when compared with the left. SUV max in  the right tonsillar region is equal to 7.966 versus 5.91 on the contralateral side. Additionally, there is a right level 2 lymph node which exhibits an SUV max of 5.01. Incidental CT findings: none CHEST: No FDG avid supraclavicular,  axillary, mediastinal or hilar lymph nodes. No FDG avid pulmonary nodule or mass identified. There is a large fat attenuation mass overlying the posterior and lateral right lower lobe. 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 Incidental CT findings: Aortic atherosclerosis. Coronary artery calcifications. ABDOMEN/PELVIS: No focal liver abnormality identified. Pneumobilia is identified status post common bile duct stent. Mild increased radiotracer uptake localizes to the head of pancreas surrounding the common bile duct stent with SUV max of 5.39. No FDG avid foci within the spleen or adrenal glands. No FDG avid abdominopelvic lymph nodes. The cystic lesions within head of pancreas are better seen on recent MRI. Incidental CT findings: Mild aortic atherosclerosis. Gallstones identified. SKELETON: No focal hypermetabolic activity to suggest skeletal metastasis. Incidental CT findings: none IMPRESSION: 1. Mild radiotracer uptake localizes to the head of pancreas in the area surrounding the common bile duct stent. Cannot rule out underlying pancreatic neoplasm. Consider further evaluation with endoscopic ultrasound with tissue sampling if indicated. 2. No signs of FDG avid liver metastasis or upper abdominal nodal metastasis. 3. There is asymmetric increased uptake within the right tonsillar region with a mildly FDG avid right level 2 lymph node. Findings may be related to upper respiratory tract viral infection. Underlying head neck neoplasm would be difficult to exclude. Correlate for any clinical signs or symptoms of recent upper respiratory tract viral infection. If clinically indicated contrast enhanced CT or MRI of  the neck and or direct visualization is advised. Electronically Signed   By: Kerby Moors M.D.   On: 11/28/2019 14:04   CT PANCREAS ABD W/WO  Result Date: 12/11/2019 CLINICAL DATA:  Pancreatic cancer staging, ERCP 11/16/2019 EXAM: CT ABDOMEN WITHOUT AND WITH CONTRAST TECHNIQUE: Multidetector CT imaging of the abdomen was performed following the standard protocol before and following the bolus administration of intravenous contrast. CONTRAST:  140mL OMNIPAQUE IOHEXOL 300 MG/ML  SOLN COMPARISON:  MR abdomen, 11/14/2019, CT abdomen pelvis, 08/02/2018 FINDINGS: Lower chest: No acute abnormality. Hepatobiliary: No focal liver abnormality is seen. Distended gallbladder containing multiple small gallstones. Persistent although reduced intra and extrahepatic biliary ductal dilatation following common bile duct stent placement. There is post stenting pneumobilia. Pancreas: Redemonstrated, ill-defined hypodense lesion of the superior pancreatic head, difficult to discretely measure although approximately 2.0 x 2.0 cm (series 14, image 21). This is traversed by the common bile duct and a common bile duct stent. There is an additional cystic lesion in the pancreatic head and neck measuring approximately 2.5 x 1.5 cm (series 14, image 24). The pancreatic duct is nearly effaced in the central pancreatic head (series 14, image 23). There is mild dilatation of the pancreatic duct in the neck up to 5 mm. Primary lesion appears to be well separated from adjacent vascular structures with the possible exception of the peripheral portion of the portal vein which may abut along a small segment of the anterior surface of the vessel (series 14, image 16). Spleen: Normal in size without focal abnormality. Adrenals/Urinary Tract: Adrenal glands are unremarkable. Kidneys are normal, without renal calculi, focal lesion, or hydronephrosis. Stomach/Bowel: Stomach is within normal limits. Appendix appears normal. No evidence of bowel wall  thickening, distention, or inflammatory changes. Vascular/Lymphatic: Aortic atherosclerosis. No enlarged abdominal lymph nodes. Other: No abdominal wall hernia or abnormality. Musculoskeletal: No acute or significant osseous findings. IMPRESSION: 1. Redemonstrated, ill-defined hypodense lesion of the superior pancreatic head, difficult to discretely measure although approximately 2.0 x 2.0 cm. This is traversed  by the common bile duct and a common bile duct stent. 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 2. There is an additional cystic lesion in the pancreatic head and neck measuring approximately 2.5 x 1.5 cm. This is of uncertain significance and may reflect a pseudocyst or IPMN. 3. The pancreatic duct is nearly effaced in the central pancreatic head. There is mild dilatation of the pancreatic duct in the neck up to 5 mm. 4. Primary lesion appears to be well separated from adjacent vascular structures with the possible exception of the peripheral portion of the portal vein which may abut along a small segment of the anterior surface of the vessel. The celiac vessels, superior mesenteric artery, superior mesenteric vein, and splenic vessels do not appear to be involved. 5. No evidence of lymphadenopathy or metastatic disease in the abdomen. 6. Distended gallbladder with cholelithiasis. 7. Aortic Atherosclerosis (ICD10-I70.0). Electronically Signed   By: Eddie Candle M.D.   On: 12/11/2019 15:12   IR IMAGING GUIDED PORT INSERTION  Result Date: 12/14/2019 CLINICAL DATA:  Pancreas carcinoma, access for chemotherapy EXAM: RIGHT INTERNAL JUGULAR SINGLE LUMEN POWER PORT CATHETER INSERTION Date:  12/14/2019 12/14/2019 11:52 am Radiologist:  Jerilynn Mages. Daryll Brod, MD Guidance:  Ultrasound and fluoroscopic MEDICATIONS: Ancef 2 g; The antibiotic was administered within an appropriate time interval prior to skin puncture. ANESTHESIA/SEDATION: Versed 2.0 mg IV; Fentanyl 100 mcg IV; Moderate Sedation Time:   23 minutes The patient was continuously monitored during the procedure by the interventional radiology nurse under my direct supervision. FLUOROSCOPY TIME:  0 minutes, 30 seconds (3.8 mGy) COMPLICATIONS: None immediate. CONTRAST:  None PROCEDURE: Informed consent was obtained from the patient following explanation of the procedure, risks, benefits and alternatives. The patient understands, agrees and consents for the procedure. All questions were addressed. A time out was performed. Maximal barrier sterile technique utilized including caps, mask, sterile gowns, sterile gloves, large sterile drape, hand hygiene, and 2% chlorhexidine scrub. Under sterile conditions and local anesthesia, right internal jugular micropuncture venous access was performed. Access was performed with ultrasound. Images were obtained for documentation of the patent right internal jugular vein. A guide wire was inserted followed by a transitional dilator. This allowed insertion of a guide wire and catheter into the IVC. Measurements were obtained from the SVC / RA junction back to the right IJ venotomy site. In the right infraclavicular chest, a subcutaneous pocket was created over the second anterior rib. This was done under sterile conditions and local anesthesia. 1% lidocaine with epinephrine was utilized for this. A 2.5 cm incision was made in the skin. Blunt dissection was performed to create a subcutaneous pocket over the right pectoralis major muscle. The pocket was flushed with saline vigorously. There was adequate hemostasis. The port catheter was assembled and checked for leakage. The port catheter was secured in the pocket with two retention sutures. The tubing was tunneled subcutaneously to the right venotomy site and inserted into the SVC/RA junction through a valved peel-away sheath. Position was confirmed with fluoroscopy. Images were obtained for documentation. The patient tolerated the procedure well. No immediate  complications. Incisions were closed in a two layer fashion with 4 - 0 Vicryl suture. Dermabond was applied to the skin. The port catheter was accessed, blood was aspirated followed by saline and heparin flushes. Needle was removed. A dry sterile dressing was applied. IMPRESSION: Ultrasound and fluoroscopically guided right internal jugular single lumen power port catheter insertion. Tip in the SVC/RA junction. Catheter ready for use. Electronically Signed  By: Eugenie Filler M.D.   On: 12/14/2019 11:54    ASSESSMENT & PLAN:   Cancer of head of pancreas ([place]) #  Pancreas adenocarcinoma-Stage IB-/boderline resectable ;Given the abutment of the SMV-proceed with neoadjuvant chemotherapy- gemcitabine/Abraxane. Discussed with Dr. Barry Dienes. Awaiting appointment next week.  # proceed with Gem-Abraxane cycle #1; day-1 today. Labs today reviewed;  acceptable for treatment today.   Discussed the potential side effects including but not limited to-increasing fatigue, nausea vomiting, diarrhea, hair loss, sores in the mouth, increase risk of infection and also neuropathy.   #Biliary obstruction status post ERCP and stenting-secondary to above malignancy-improved however today elevated alkaline phosphatase-157/normal bilirubin. Monitor for now-  # DISPOSITION: # chemo today # 1 week; labs- cbc/cmp Gem-Abraxane # in 2 weeks-MD; labs- cbc.cmp;ca-19-9; Gem-Abarxane-dr.B  All questions were answered. The patient knows to call the clinic with any problems, questions or concerns.    Cammie Sickle, MD 12/19/2019 12:46 PM

## 2019-12-19 NOTE — Progress Notes (Signed)
New start chemotherapy today. Hand off provided to [facility], Therapist, sports.

## 2019-12-20 LAB — CANCER ANTIGEN 19-9: CA 19-9: 98 U/mL — ABNORMAL HIGH (ref 0–35)

## 2019-12-21 ENCOUNTER — Telehealth: Payer: Self-pay

## 2019-12-21 NOTE — Telephone Encounter (Signed)
Telephone call to patient for follow up after receiving first infusion.   Patient states infusion went great.  States eating good and drinking plenty of fluids.   Denies any nausea or vomiting.  Encouraged patient to call for any concerns or questions. 

## 2019-12-26 ENCOUNTER — Inpatient Hospital Stay: Payer: Medicare Other

## 2019-12-26 ENCOUNTER — Telehealth: Payer: Self-pay

## 2019-12-26 NOTE — Telephone Encounter (Signed)
Called pt and gave her the number to call for COVID testing on next Monday. Pt expressed understanding.

## 2019-12-26 NOTE — Telephone Encounter (Signed)
Per Dr. Mathis Dad chemo this week; will need COVID test- next monday-oct 4; and then decide on chemo next wednesday

## 2019-12-31 ENCOUNTER — Other Ambulatory Visit: Payer: Self-pay

## 2019-12-31 DIAGNOSIS — Z20822 Contact with and (suspected) exposure to covid-19: Secondary | ICD-10-CM

## 2020-01-01 ENCOUNTER — Other Ambulatory Visit: Payer: Self-pay | Admitting: Internal Medicine

## 2020-01-01 ENCOUNTER — Telehealth: Payer: Self-pay

## 2020-01-01 ENCOUNTER — Telehealth: Payer: Self-pay | Admitting: *Deleted

## 2020-01-01 LAB — NOVEL CORONAVIRUS, NAA: SARS-CoV-2, NAA: NOT DETECTED

## 2020-01-01 LAB — SARS-COV-2, NAA 2 DAY TAT

## 2020-01-01 MED ORDER — PEGFILGRASTIM INJECTION 6 MG/0.6ML ~~LOC~~: 6.0000 mg | PREFILLED_SYRINGE | Freq: Once | SUBCUTANEOUS | 6 refills | Status: AC

## 2020-01-01 NOTE — Telephone Encounter (Signed)
Received secure chat from Debbora Lacrosse that gem-abraxane is not in stock and she did not know when it would be available again. I called pt to let her know the update and assured her, per Dr. Jacinto Reap, that he will be looking into other treatment options and to keep appointment with Dr. B in the morning. Pt expressed understanding.

## 2020-01-01 NOTE — Progress Notes (Signed)
DISCONTINUE ON PATHWAY REGIMEN - Pancreatic Adenocarcinoma     A cycle is every 28 days:     Nab-paclitaxel (protein bound)      Gemcitabine   **Always confirm dose/schedule in your pharmacy ordering system**  REASON: Other Reason PRIOR TREATMENT: [id number]: Nab-Paclitaxel (Abraxane) 125 mg/m2 D1, 8, 15 + Gemcitabine 1,000 mg/m2 D1, 8, 15 q28 Days x 2 Cycles TREATMENT RESPONSE: Unable to Evaluate  START ON PATHWAY REGIMEN - Pancreatic Adenocarcinoma     A cycle is every 14 days:     Oxaliplatin      Leucovorin      Irinotecan      Fluorouracil   **Always confirm dose/schedule in your pharmacy ordering system**  Patient Characteristics: Preoperative (Clinical Staging), Borderline Resectable, PS = 0,1, BRCA1/2 and PALB2 Mutation Absent/Unknown Therapeutic Status: Preoperative (Clinical Staging) AJCC T Category: cT2 AJCC N Category: cN0 Resectability Status: Borderline Resectable AJCC M Category: cM0 AJCC 8 Stage Grouping: IB ECOG Performance Status: 1 BRCA1/2 Mutation Status: Quantity Not Sufficient PALB2 Mutation Status: Quantity Not Sufficient Intent of Therapy: Curative Intent, Discussed with Patient

## 2020-01-01 NOTE — Telephone Encounter (Signed)
Patient called and states that her COVID test was negative and she is asking what to do about her appointment tomorrow. Please advise

## 2020-01-02 ENCOUNTER — Other Ambulatory Visit: Payer: Self-pay | Admitting: Internal Medicine

## 2020-01-02 ENCOUNTER — Inpatient Hospital Stay: Payer: Medicare Other | Attending: Internal Medicine

## 2020-01-02 ENCOUNTER — Other Ambulatory Visit: Payer: Self-pay

## 2020-01-02 ENCOUNTER — Inpatient Hospital Stay (HOSPITAL_BASED_OUTPATIENT_CLINIC_OR_DEPARTMENT_OTHER): Payer: Medicare Other | Admitting: Internal Medicine

## 2020-01-02 ENCOUNTER — Encounter: Payer: Self-pay | Admitting: Internal Medicine

## 2020-01-02 ENCOUNTER — Inpatient Hospital Stay: Payer: Medicare Other

## 2020-01-02 VITALS — BP 192/85 | HR 109

## 2020-01-02 VITALS — BP 153/87 | HR 84 | Temp 96.3°F | Resp 16 | Ht 63.0 in | Wt 190.0 lb

## 2020-01-02 DIAGNOSIS — C250 Malignant neoplasm of head of pancreas: Secondary | ICD-10-CM

## 2020-01-02 DIAGNOSIS — Z452 Encounter for adjustment and management of vascular access device: Secondary | ICD-10-CM | POA: Insufficient documentation

## 2020-01-02 DIAGNOSIS — E876 Hypokalemia: Secondary | ICD-10-CM | POA: Diagnosis not present

## 2020-01-02 DIAGNOSIS — Z5189 Encounter for other specified aftercare: Secondary | ICD-10-CM | POA: Diagnosis not present

## 2020-01-02 DIAGNOSIS — I10 Essential (primary) hypertension: Secondary | ICD-10-CM | POA: Insufficient documentation

## 2020-01-02 DIAGNOSIS — Z5111 Encounter for antineoplastic chemotherapy: Secondary | ICD-10-CM | POA: Insufficient documentation

## 2020-01-02 LAB — CBC WITH DIFFERENTIAL/PLATELET
Abs Immature Granulocytes: 0.03 10*3/uL (ref 0.00–0.07)
Basophils Absolute: 0 10*3/uL (ref 0.0–0.1)
Basophils Relative: 1 %
Eosinophils Absolute: 0.1 10*3/uL (ref 0.0–0.5)
Eosinophils Relative: 2 %
HCT: 30.9 % — ABNORMAL LOW (ref 36.0–46.0)
Hemoglobin: 10 g/dL — ABNORMAL LOW (ref 12.0–15.0)
Immature Granulocytes: 1 %
Lymphocytes Relative: 37 %
Lymphs Abs: 1.9 10*3/uL (ref 0.7–4.0)
MCH: 23.6 pg — ABNORMAL LOW (ref 26.0–34.0)
MCHC: 32.4 g/dL (ref 30.0–36.0)
MCV: 72.9 fL — ABNORMAL LOW (ref 80.0–100.0)
Monocytes Absolute: 0.5 10*3/uL (ref 0.1–1.0)
Monocytes Relative: 10 %
Neutro Abs: 2.5 10*3/uL (ref 1.7–7.7)
Neutrophils Relative %: 49 %
Platelets: 466 10*3/uL — ABNORMAL HIGH (ref 150–400)
RBC: 4.24 MIL/uL (ref 3.87–5.11)
RDW: 16.8 % — ABNORMAL HIGH (ref 11.5–15.5)
WBC: 5 10*3/uL (ref 4.0–10.5)
nRBC: 0 % (ref 0.0–0.2)

## 2020-01-02 LAB — COMPREHENSIVE METABOLIC PANEL
ALT: 20 U/L (ref 0–44)
AST: 21 U/L (ref 15–41)
Albumin: 3.7 g/dL (ref 3.5–5.0)
Alkaline Phosphatase: 156 U/L — ABNORMAL HIGH (ref 38–126)
Anion gap: 12 (ref 5–15)
BUN: 8 mg/dL (ref 8–23)
CO2: 24 mmol/L (ref 22–32)
Calcium: 8.8 mg/dL — ABNORMAL LOW (ref 8.9–10.3)
Chloride: 102 mmol/L (ref 98–111)
Creatinine, Ser: 0.76 mg/dL (ref 0.44–1.00)
GFR calc non Af Amer: >60 mL/min (ref >60)
Glucose, Bld: 113 mg/dL — ABNORMAL HIGH (ref 70–99)
Potassium: 3.4 mmol/L — ABNORMAL LOW (ref 3.5–5.1)
Sodium: 138 mmol/L (ref 135–145)
Total Bilirubin: 0.5 mg/dL (ref 0.3–1.2)
Total Protein: 7.5 g/dL (ref 6.5–8.1)

## 2020-01-02 MED ORDER — DEXTROSE 5 % IV SOLN
Freq: Once | INTRAVENOUS | Status: AC
  Filled 2020-01-02: qty 250

## 2020-01-02 MED ORDER — ATROPINE SULFATE 1 MG/ML IJ SOLN
0.5000 mg | Freq: Once | INTRAMUSCULAR | Status: AC
  Administered 2020-01-02: 0.5 mg via INTRAVENOUS

## 2020-01-02 MED ORDER — SODIUM CHLORIDE 0.9 % IV SOLN
2400.0000 mg/m2 | INTRAVENOUS | Status: DC
  Administered 2020-01-02: 4700 mg via INTRAVENOUS
  Filled 2020-01-02: qty 94

## 2020-01-02 MED ORDER — SODIUM CHLORIDE 0.9 % IV SOLN
10.0000 mg | Freq: Once | INTRAVENOUS | Status: AC
  Administered 2020-01-02: 10 mg via INTRAVENOUS
  Filled 2020-01-02: qty 10

## 2020-01-02 MED ORDER — SODIUM CHLORIDE 0.9 % IV SOLN
150.0000 mg | Freq: Once | INTRAVENOUS | Status: AC
  Administered 2020-01-02: 150 mg via INTRAVENOUS
  Filled 2020-01-02: qty 5

## 2020-01-02 MED ORDER — SODIUM CHLORIDE 0.9 % IV SOLN
800.0000 mg | Freq: Once | INTRAVENOUS | Status: AC
  Administered 2020-01-02: 800 mg via INTRAVENOUS
  Filled 2020-01-02: qty 25

## 2020-01-02 MED ORDER — SODIUM CHLORIDE 0.9 % IV SOLN
150.0000 mg/m2 | Freq: Once | INTRAVENOUS | Status: AC
  Administered 2020-01-02: 300 mg via INTRAVENOUS
  Filled 2020-01-02: qty 15

## 2020-01-02 MED ORDER — DIPHENOXYLATE-ATROPINE 2.5-0.025 MG PO TABS: 1.0000 | ORAL_TABLET | Freq: Four times a day (QID) | ORAL | 0 refills | Status: DC | PRN

## 2020-01-02 MED ORDER — SODIUM CHLORIDE 0.9% FLUSH
10.0000 mL | Freq: Once | INTRAVENOUS | Status: AC
  Administered 2020-01-02: 10 mL via INTRAVENOUS
  Filled 2020-01-02: qty 10

## 2020-01-02 MED ORDER — ATROPINE SULFATE 1 MG/ML IJ SOLN
0.5000 mg | Freq: Once | INTRAMUSCULAR | Status: AC | PRN
  Administered 2020-01-02: 0.5 mg via INTRAVENOUS
  Filled 2020-01-02: qty 1

## 2020-01-02 MED ORDER — PALONOSETRON HCL INJECTION 0.25 MG/5ML
0.2500 mg | Freq: Once | INTRAVENOUS | Status: AC
  Administered 2020-01-02: 0.25 mg via INTRAVENOUS
  Filled 2020-01-02: qty 5

## 2020-01-02 MED ORDER — ATROPINE SULFATE 1 MG/ML IJ SOLN
INTRAMUSCULAR | Status: AC
  Filled 2020-01-02: qty 1

## 2020-01-02 MED ORDER — OXALIPLATIN CHEMO INJECTION 100 MG/20ML
85.0000 mg/m2 | Freq: Once | INTRAVENOUS | Status: AC
  Administered 2020-01-02: 165 mg via INTRAVENOUS
  Filled 2020-01-02: qty 33

## 2020-01-02 NOTE — Patient Instructions (Signed)
#   take immodium 1 pill after each loose stools; and also take lomotil if diarrhea is not ant better.

## 2020-01-02 NOTE — Progress Notes (Signed)
[place] NOTE  Patient Care Team: [place] as PCP - General [name], [place], Bruce (Family Medicine) Rico Junker, RN as Registered Nurse Theodore Demark, RN as Registered Nurse Clent Jacks, RN as Oncology Nurse Navigator  CHIEF COMPLAINTS/PURPOSE OF CONSULTATION: pancreas adenocarcinoma    Oncology History Overview Note  #Pancreas adenocarcinoma-Stage IB- [id number]-[EUS- Dr.Spaete; [facility]/GI; mass 22x26m; invading/abutting superior mesenteric vein; 2 enlarged lymph nodes peripancreatic/porta hepatis largest 10 x 5.8 mm-nonpathologic based on EUS criteria/no biopsy]-borderline resectable.  PET scan no evidence of distant metastatic disease.  #Biliary obstruction status post ERCP and stenting [Dr.Wohl]-  # SEP, 22,2021- GEM-ABRAXANE  # Borderline DM; HTN   # SURVIVORSHIP:   # GENETICS:   DIAGNOSIS: Pancreatic cancer  STAGE:  IB/borderline resectable;  GOALS: cure  CURRENT/MOST RECENT THERAPY : Neoadjuvant therapy-GEM-ABRAXANE    [place]  [place] Diagnosis   [place]   12/19/2019 - 12/19/2019 Chemotherapy   The patient had PACLitaxel-protein bound (ABRAXANE) chemo infusion 250 mg, 125 mg/m2 = 250 mg, Intravenous,  Once, 1 of 4 cycles Administration: 250 mg (12/19/2019) gemcitabine (GEMZAR) 2,000 mg in sodium chloride 0.9 % 250 mL chemo infusion, 1,976 mg, Intravenous,  Once, 1 of 4 cycles Administration: 2,000 mg (12/19/2019)  for chemotherapy treatment.    01/02/2020 -  Chemotherapy   The patient had dexamethasone (DECADRON) 4 MG tablet, 8 mg, Oral, Daily, 1 of 1 cycle, Start date: --, End date: -- palonosetron (ALOXI) injection 0.25 mg, 0.25 mg, Intravenous,  Once, 1 of 8 cycles pegfilgrastim (NEULASTA ONPRO KIT) injection 6 mg, 6 mg, Subcutaneous, Once, 1 of 8 cycles irinotecan (CAMPTOSAR) 300 mg in sodium chloride 0.9 % 500 mL chemo infusion, 150 mg/m2 = 300 mg,  Intravenous,  Once, 1 of 8 cycles oxaliplatin (ELOXATIN) 165 mg in dextrose 5 % 500 mL chemo infusion, 85 mg/m2 = 165 mg, Intravenous,  Once, 1 of 8 cycles fosaprepitant (EMEND) 150 mg in sodium chloride 0.9 % 145 mL IVPB, 150 mg, Intravenous,  Once, 1 of 8 cycles fluorouracil (ADRUCIL) 4,700 mg in sodium chloride 0.9 % 56 mL chemo infusion, 2,400 mg/m2 = 4,700 mg, Intravenous, 1 Day/Dose, 1 of 8 cycles leucovorin 784 mg in sodium chloride 0.9 % 250 mL infusion, 400 mg/m2 = 784 mg, Intravenous,  Once, 1 of 8 cycles  for chemotherapy treatment.       HISTORY OF PRESENTING ILLNESS:  Robin [id number].  female newly diagnosed borderline pancreatic adeno ca is here for a follow up-proceed with neoadjuvant chemotherapy.  In the interim patient was evaluated by surgery Dr. BBarry Dienes  Patient was exposed to her son who was positive for Covid.  Patient's Covid testing reportedly negative.  Patient s/p Covid vaccination.  Appetite is good.  No nausea no vomiting.  No chills.  Review of Systems  Constitutional: Positive for weight loss. Negative for chills, diaphoresis, fever and malaise/fatigue.  HENT: Negative for nosebleeds and sore throat.   Eyes: Negative for double vision.  Respiratory: Negative for cough, hemoptysis, sputum production, shortness of breath and wheezing.   Cardiovascular: Negative for chest pain, palpitations, orthopnea and leg swelling.  Gastrointestinal: Negative for abdominal pain, blood in stool, constipation, diarrhea, heartburn, melena, nausea and vomiting.  Genitourinary: Negative for dysuria, frequency and urgency.  Musculoskeletal: Negative for back pain and joint pain.  Skin: Negative.  Negative for itching and rash.  Neurological: Negative for dizziness, tingling, focal weakness, weakness  and headaches.  Endo/Heme/Allergies: Does not bruise/bleed easily.  Psychiatric/Behavioral: Negative for depression. The patient is not nervous/anxious and does not have  insomnia.      MEDICAL HISTORY:  Past Medical History:  Diagnosis Date  . Anemia    history of  . Anxiety 09/11/2014  . Arthritis   . Cancer of head of pancreas ([place]) 12/04/2019  . Diabetes mellitus without complication ([place])   . Fluttering heart   . Hyperlipidemia   . Hypertension     SURGICAL HISTORY: Past Surgical History:  Procedure Laterality Date  . ENDOSCOPIC RETROGRADE CHOLANGIOPANCREATOGRAPHY (ERCP) WITH PROPOFOL N/A 11/16/2019   Procedure: ENDOSCOPIC RETROGRADE CHOLANGIOPANCREATOGRAPHY (ERCP) WITH PROPOFOL;  Surgeon: Lucilla Lame, MD;  Location: [facility] ENDOSCOPY;  Service: Endoscopy;  Laterality: N/A;  . EUS N/A 11/29/2019   Procedure: FULL UPPER ENDOSCOPIC ULTRASOUND (EUS) RADIAL;  Surgeon: Reita Cliche, MD;  Location: [facility] ENDOSCOPY;  Service: Gastroenterology;  Laterality: N/A;  . IR IMAGING GUIDED PORT INSERTION  12/14/2019  . right knee replacement Right [id number]  . SHOULDER ARTHROSCOPY W/ ROTATOR CUFF REPAIR Right     SOCIAL HISTORY: Social History   Socioeconomic History  . Marital status: Divorced    Spouse name: Not on file  . Number of children: Not on file  . Years of education: Not on file  . Highest education level: Not on file  Occupational History  . Not on file  Tobacco Use  . Smoking status: Never Smoker  . Smokeless tobacco: Never Used  Vaping Use  . Vaping Use: Never used  Substance and Sexual Activity  . Alcohol use: No    Alcohol/week: 0.0 standard drinks  . Drug use: No  . Sexual activity: Never  Other Topics Concern  . Not on file  Social History Narrative   Lives in [place]; with mom and son; worked in dietary; never smoked; no alcohol.    Social Determinants of Health   Financial Resource Strain:   . Difficulty of Paying Living Expenses: Not on file  Food Insecurity:   . Worried About Charity fundraiser in the Last Year: Not on file  . Ran Out of Food in the Last Year: Not on file  Transportation Needs:   . Lack of  Transportation (Medical): Not on file  . Lack of Transportation (Non-Medical): Not on file  Physical Activity:   . Days of Exercise per Week: Not on file  . Minutes of Exercise per Session: Not on file  Stress:   . Feeling of Stress : Not on file  Social Connections:   . Frequency of Communication with Friends and Family: Not on file  . Frequency of Social Gatherings with Friends and Family: Not on file  . Attends Religious Services: Not on file  . Active Member of Clubs or Organizations: Not on file  . Attends Archivist Meetings: Not on file  . Marital Status: Not on file  Intimate Partner Violence:   . Fear of Current or Ex-Partner: Not on file  . Emotionally Abused: Not on file  . Physically Abused: Not on file  . Sexually Abused: Not on file    FAMILY HISTORY: Family History  Problem Relation Age of Onset  . Diabetes Mother   . Hyperlipidemia Mother   . Hypertension Mother   . Vision loss Mother   . Arthritis Father   . Hyperlipidemia Father   . Hypertension Father   . Breast cancer Maternal Aunt     ALLERGIES:  has No  Known Allergies.  MEDICATIONS:  Current Outpatient Medications  Medication Sig Dispense Refill  . acetaminophen (TYLENOL) 650 MG CR tablet Take 1 tablet by mouth every 8 (eight) hours as needed.    Marland Kitchen amLODipine (NORVASC) 10 MG tablet Take 10 mg by mouth daily.    . metoprolol succinate (TOPROL-XL) 50 MG 24 hr tablet Take 50 mg by mouth daily. Take with or immediately following a meal.     . Potassium Chloride ER 20 MEQ TBCR Take 20 mEq by mouth daily.     . vitamin B-12 (CYANOCOBALAMIN) 1000 MCG tablet Take 1 tablet (1,000 mcg total) by mouth daily.    . diphenoxylate-atropine (LOMOTIL) 2.5-0.025 MG tablet Take 1 tablet by mouth 4 (four) times daily as needed for diarrhea or loose stools. Take it along with immodium 45 tablet 0   No current facility-administered medications for this visit.   Facility-Administered Medications Ordered in  Other Visits  Medication Dose Route Frequency Provider Last Rate Last Admin  . atropine injection 0.5 mg  0.5 mg Intravenous Once PRN Cammie Sickle, MD      . dexamethasone (DECADRON) 10 mg in sodium chloride 0.9 % 50 mL IVPB  10 mg Intravenous Once Charlaine Dalton R, MD      . fluorouracil (ADRUCIL) 4,700 mg in sodium chloride 0.9 % 56 mL chemo infusion  2,400 mg/m2 (Treatment Plan Recorded) Intravenous 1 day or 1 dose Charlaine Dalton R, MD      . fosaprepitant (EMEND) 150 mg in sodium chloride 0.9 % 145 mL IVPB  150 mg Intravenous Once Charlaine Dalton R, MD      . irinotecan (CAMPTOSAR) 300 mg in sodium chloride 0.9 % 500 mL chemo infusion  150 mg/m2 (Treatment Plan Recorded) Intravenous Once Charlaine Dalton R, MD      . leucovorin 800 mg in sodium chloride 0.9 % 250 mL infusion  800 mg Intravenous Once Charlaine Dalton R, MD      . oxaliplatin (ELOXATIN) 165 mg in dextrose 5 % 500 mL chemo infusion  85 mg/m2 (Treatment Plan Recorded) Intravenous Once Charlaine Dalton R, MD      . palonosetron (ALOXI) injection 0.25 mg  0.25 mg Intravenous Once Cammie Sickle, MD          .  PHYSICAL EXAMINATION: ECOG PERFORMANCE STATUS: 1 - Symptomatic but completely ambulatory  Vitals:   01/02/20 0845  BP: (!) 153/87  Pulse: 84  Resp: 16  Temp: (!) 96.3 F (35.7 C)  SpO2: 100%   Filed Weights   01/02/20 0845  Weight: 190 lb (86.2 kg)    Physical Exam HENT:     Head: Normocephalic and atraumatic.     Mouth/Throat:     Pharynx: No oropharyngeal exudate.  Eyes:     Pupils: Pupils are equal, round, and reactive to light.  Cardiovascular:     Rate and Rhythm: Normal rate and regular rhythm.  Pulmonary:     Effort: Pulmonary effort is normal. No respiratory distress.     Breath sounds: Normal breath sounds. No wheezing.  Abdominal:     General: Bowel sounds are normal. There is no distension.     Palpations: Abdomen is soft. There is no mass.      Tenderness: There is no abdominal tenderness. There is no guarding or rebound.  Musculoskeletal:        General: No tenderness. Normal range of motion.     Cervical back: Normal range of motion and neck supple.  Skin:  General: Skin is warm.  Neurological:     Mental Status: She is alert and oriented to person, place, and time.  Psychiatric:        Mood and Affect: Affect normal.      LABORATORY DATA:  I have reviewed the data as listed Lab Results  Component Value Date   WBC 5.0 01/02/2020   HGB 10.0 (L) 01/02/2020   HCT 30.9 (L) 01/02/2020   MCV 72.9 (L) 01/02/2020   PLT 466 (H) 01/02/2020   Recent Labs    11/21/19 1449 11/21/19 1449 12/04/19 1327 12/19/19 0833 01/02/20 0831  NA 133*   < > 136 139 138  K 4.2   < > 3.6 3.2* 3.4*  CL 91*   < > 102 102 102  CO2 30   < > 26 26 24   GLUCOSE 169*   < > 126* 168* 113*  BUN 15   < > 7* 8 8  CREATININE 1.02*   < > 0.90 0.89 0.76  CALCIUM 9.0   < > 8.7* 8.9 8.8*  GFRNONAA 57*   < > >60 >60 >60  GFRAA >60  --  >60 >60  --   PROT 8.3*   < > 7.3 7.6 7.5  ALBUMIN 3.4*   < > 3.5 3.5 3.7  AST 45*   < > 19 35 21  ALT 137*   < > 22 50* 20  ALKPHOS 405*   < > 156* 294* 156*  BILITOT 1.8*   < > 0.9 0.7 0.5   < > = values in this interval not displayed.    RADIOGRAPHIC STUDIES: I have personally reviewed the radiological images as listed and agreed with the findings in the report. CT PANCREAS ABD W/WO  Result Date: 12/11/2019 CLINICAL DATA:  Pancreatic cancer staging, ERCP 11/16/2019 EXAM: CT ABDOMEN WITHOUT AND WITH CONTRAST TECHNIQUE: Multidetector CT imaging of the abdomen was performed following the standard protocol before and following the bolus administration of intravenous contrast. CONTRAST:  155m OMNIPAQUE IOHEXOL 300 MG/ML  SOLN COMPARISON:  MR abdomen, 11/14/2019, CT abdomen pelvis, 08/02/2018 FINDINGS: Lower chest: No acute abnormality. Hepatobiliary: No focal liver abnormality is seen. Distended gallbladder  containing multiple small gallstones. Persistent although reduced intra and extrahepatic biliary ductal dilatation following common bile duct stent placement. There is post stenting pneumobilia. Pancreas: Redemonstrated, ill-defined hypodense lesion of the superior pancreatic head, difficult to discretely measure although approximately 2.0 x 2.0 cm (series 14, image 21). This is traversed by the common bile duct and a common bile duct stent. There is an additional cystic lesion in the pancreatic head and neck measuring approximately 2.5 x 1.5 cm (series 14, image 24). The pancreatic duct is nearly effaced in the central pancreatic head (series 14, image 23). There is mild dilatation of the pancreatic duct in the neck up to 5 mm. Primary lesion appears to be well separated from adjacent vascular structures with the possible exception of the peripheral portion of the portal vein which may abut along a small segment of the anterior surface of the vessel (series 14, image 16). Spleen: Normal in size without focal abnormality. Adrenals/Urinary Tract: Adrenal glands are unremarkable. Kidneys are normal, without renal calculi, focal lesion, or hydronephrosis. Stomach/Bowel: Stomach is within normal limits. Appendix appears normal. No evidence of bowel wall thickening, distention, or inflammatory changes. Vascular/Lymphatic: Aortic atherosclerosis. No enlarged abdominal lymph nodes. Other: No abdominal wall hernia or abnormality. Musculoskeletal: No acute or significant osseous findings. IMPRESSION: 1. Redemonstrated, ill-defined hypodense  lesion of the superior pancreatic head, difficult to discretely measure although approximately 2.0 x 2.0 cm. This is traversed by the common bile duct and a common bile duct stent. 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 2. There is an additional cystic lesion in the pancreatic head and neck measuring approximately 2.5 x 1.5 cm. This is of uncertain significance  and may reflect a pseudocyst or IPMN. 3. The pancreatic duct is nearly effaced in the central pancreatic head. There is mild dilatation of the pancreatic duct in the neck up to 5 mm. 4. Primary lesion appears to be well separated from adjacent vascular structures with the possible exception of the peripheral portion of the portal vein which may abut along a small segment of the anterior surface of the vessel. The celiac vessels, superior mesenteric artery, superior mesenteric vein, and splenic vessels do not appear to be involved. 5. No evidence of lymphadenopathy or metastatic disease in the abdomen. 6. Distended gallbladder with cholelithiasis. 7. Aortic Atherosclerosis (ICD10-I70.0). Electronically Signed   By: Eddie Candle M.D.   On: 12/11/2019 15:12   IR IMAGING GUIDED PORT INSERTION  Result Date: 12/14/2019 CLINICAL DATA:  Pancreas carcinoma, access for chemotherapy EXAM: RIGHT INTERNAL JUGULAR SINGLE LUMEN POWER PORT CATHETER INSERTION Date:  12/14/2019 12/14/2019 11:52 am Radiologist:  Jerilynn Mages. Daryll Brod, MD Guidance:  Ultrasound and fluoroscopic MEDICATIONS: Ancef 2 g; The antibiotic was administered within an appropriate time interval prior to skin puncture. ANESTHESIA/SEDATION: Versed 2.0 mg IV; Fentanyl 100 mcg IV; Moderate Sedation Time:  23 minutes The patient was continuously monitored during the procedure by the interventional radiology nurse under my direct supervision. FLUOROSCOPY TIME:  0 minutes, 30 seconds (3.8 mGy) COMPLICATIONS: None immediate. CONTRAST:  None PROCEDURE: Informed consent was obtained from the patient following explanation of the procedure, risks, benefits and alternatives. The patient understands, agrees and consents for the procedure. All questions were addressed. A time out was performed. Maximal barrier sterile technique utilized including caps, mask, sterile gowns, sterile gloves, large sterile drape, hand hygiene, and 2% chlorhexidine scrub. Under sterile conditions and  local anesthesia, right internal jugular micropuncture venous access was performed. Access was performed with ultrasound. Images were obtained for documentation of the patent right internal jugular vein. A guide wire was inserted followed by a transitional dilator. This allowed insertion of a guide wire and catheter into the IVC. Measurements were obtained from the SVC / RA junction back to the right IJ venotomy site. In the right infraclavicular chest, a subcutaneous pocket was created over the second anterior rib. This was done under sterile conditions and local anesthesia. 1% lidocaine with epinephrine was utilized for this. A 2.5 cm incision was made in the skin. Blunt dissection was performed to create a subcutaneous pocket over the right pectoralis major muscle. The pocket was flushed with saline vigorously. There was adequate hemostasis. The port catheter was assembled and checked for leakage. The port catheter was secured in the pocket with two retention sutures. The tubing was tunneled subcutaneously to the right venotomy site and inserted into the SVC/RA junction through a valved peel-away sheath. Position was confirmed with fluoroscopy. Images were obtained for documentation. The patient tolerated the procedure well. No immediate complications. Incisions were closed in a two layer fashion with 4 - 0 Vicryl suture. Dermabond was applied to the skin. The port catheter was accessed, blood was aspirated followed by saline and heparin flushes. Needle was removed. A dry sterile dressing was applied. IMPRESSION: Ultrasound and fluoroscopically guided right internal  jugular single lumen power port catheter insertion. Tip in the SVC/RA junction. Catheter ready for use. Electronically Signed   By: Jerilynn Mages.  Shick M.D.   On: 12/14/2019 11:54    ASSESSMENT & PLAN:   Cancer of head of pancreas ([place]) #  Pancreas adenocarcinoma-Stage IB-/boderline resectable ;Given the abutment of the SMV-plan neoadjuvant chemotherapy-  gemcitabine/Abraxane [sp cycle 1-d-1]. Change to FOLFIRINX sec to shotage of braxane.  # proceed with cycle #1 of FOLFIRINOX today. CBC CMP are reviewed; adequate today; no contraindications to chemotherapy. Proceed with treatment today.   Discussed the potential side effects including but not limited to-increasing fatigue, nausea vomiting, diarrhea, hair loss, sores in the mouth, increase risk of infection and also neuropathy. prescrobed- lomotil.  #Biliary obstruction status post ERCP and stenting-secondary to above malignancy-improved however today elevated alkaline phosphatase-157/normal bilirubin. Monitor for now-  # DISPOSITION: # chemo today; pump off 10/08 # on 10/14-- MD labs- cbc/cmp; IVFs over 1 hours # in 2 weeks-MD; labs- cbc.cmp;ca-19-9; FOLFIRINOX; d-3 pump -OFF-dr.B  All questions were answered. The patient knows to call the clinic with any problems, questions or concerns.    Cammie Sickle, MD 01/02/2020 10:10 AM

## 2020-01-02 NOTE — Assessment & Plan Note (Addendum)
#    Pancreas adenocarcinoma-Stage IB-/boderline resectable ;Given the abutment of the SMV-plan neoadjuvant chemotherapy- gemcitabine/Abraxane [sp cycle 1-d-1]. Change to FOLFIRINX sec to because of Abraxane shortage.  # proceed with cycle #1 of FOLFIRINOX today. CBC CMP are reviewed; adequate today; no contraindications to chemotherapy. Proceed with treatment today.  Discussed that we will plan 6- cycles of chemotherapy.  However we will plan repeat imaging after 4-6 cycles.  Discussed with Dr. Barry Dienes.  Discussed the potential side effects including but not limited to-increasing fatigue, nausea vomiting, diarrhea, hair loss, sores in the mouth, increase risk of infection and also neuropathy.  Discussed at length regarding importance of taking Imodium/Lomotil as prescribed.  # Growth factor-Neulasta/On pro would be given as prophylaxis for chemotherapy-induced neutropenia to prevent febrile neutropenias. Discussed potential side effect- myalgias/arthralgias- recommend Claritin.   #Biliary obstruction status post ERCP and stenting-secondary to above malignancy-improved however today elevated alkaline phosphatase-157/normal bilirubin.  Monitor closely.  # DISPOSITION: # chemo today; pump off 10/08 # on 10/14-- MD labs- cbc/cmp; IVFs over 1 hours # in 2 weeks-MD; labs- cbc.cmp;ca-19-9; FOLFIRINOX; d-3 pump -OFF-dr.B  # 40 minutes face-to-face with the patient discussing the above plan of care; more than 50% of time spent on prognosis/ natural history; counseling and coordination.

## 2020-01-02 NOTE — Progress Notes (Signed)
Per Matt C. In pharmacy Leucovorin to run over 90 minutes instead of 2 hours. Rate changed **see MAR**   1510: pt infusion complete, pt reports "this stuff sure makes you feel funny". Pt states that "my eyes are twitching", that vision is blurred, and that her stomach is "rolling, pt states that her stomach is not upset, that the feeling is hard to describe. Pt also reports that her lips feel "funny" pt denies SOB, swelling or tingingling. Pt denies any other symptoms at this time. Per Dr. Rogue Bussing give another dose of Atropine 0.5mg  IV **see MAR** and okay to start Adrucil pump and discharge home. 1540: B/P 192/85, HR 109, pt states all symptoms are improving except the "funny feeling" in lips. Per Dr. Rogue Bussing okay to discharge home at this time. Pt stable and educated to call clinic with any questions or report to ER/call 911 in the event of an emergency. Pump  Education performed. Pt verbalizes understanding. Pt helped to lobby, pt has a ride home and reports that she will not drive at this time. Pt stable at discharge.

## 2020-01-03 LAB — CANCER ANTIGEN 19-9: CA 19-9: 66 U/mL — ABNORMAL HIGH (ref 0–35)

## 2020-01-04 ENCOUNTER — Inpatient Hospital Stay: Payer: Medicare Other

## 2020-01-04 ENCOUNTER — Other Ambulatory Visit: Payer: Self-pay

## 2020-01-04 VITALS — BP 175/80 | HR 80 | Temp 97.2°F

## 2020-01-04 DIAGNOSIS — C250 Malignant neoplasm of head of pancreas: Secondary | ICD-10-CM

## 2020-01-04 DIAGNOSIS — Z5111 Encounter for antineoplastic chemotherapy: Secondary | ICD-10-CM | POA: Diagnosis not present

## 2020-01-04 MED ORDER — HEPARIN SOD (PORK) LOCK FLUSH 100 UNIT/ML IV SOLN
INTRAVENOUS | Status: AC
  Filled 2020-01-04: qty 5

## 2020-01-04 MED ORDER — SODIUM CHLORIDE 0.9% FLUSH
10.0000 mL | INTRAVENOUS | Status: DC | PRN
  Administered 2020-01-04: 10 mL
  Filled 2020-01-04: qty 10

## 2020-01-04 MED ORDER — HEPARIN SOD (PORK) LOCK FLUSH 100 UNIT/ML IV SOLN
500.0000 [IU] | Freq: Once | INTRAVENOUS | Status: AC | PRN
  Administered 2020-01-04: 500 [IU]
  Filled 2020-01-04: qty 5

## 2020-01-04 MED ORDER — PEGFILGRASTIM-CBQV 6 MG/0.6ML ~~LOC~~ SOSY
6.0000 mg | PREFILLED_SYRINGE | Freq: Once | SUBCUTANEOUS | Status: AC
  Administered 2020-01-04: 6 mg via SUBCUTANEOUS
  Filled 2020-01-04: qty 0.6

## 2020-01-04 NOTE — Progress Notes (Signed)
BP elevated. Per Patient, has not taken BP meds today. Educated patient to take BP meds as directed by MD and to see emergency services should she have any symptoms. Patient verbalizes understanding and denies any further questions or concerns.

## 2020-01-07 ENCOUNTER — Ambulatory Visit: Payer: Medicare Other

## 2020-01-10 ENCOUNTER — Inpatient Hospital Stay (HOSPITAL_BASED_OUTPATIENT_CLINIC_OR_DEPARTMENT_OTHER): Payer: Medicare Other | Admitting: Internal Medicine

## 2020-01-10 ENCOUNTER — Inpatient Hospital Stay: Payer: Medicare Other

## 2020-01-10 ENCOUNTER — Other Ambulatory Visit: Payer: Self-pay

## 2020-01-10 DIAGNOSIS — C250 Malignant neoplasm of head of pancreas: Secondary | ICD-10-CM

## 2020-01-10 DIAGNOSIS — Z5111 Encounter for antineoplastic chemotherapy: Secondary | ICD-10-CM | POA: Diagnosis not present

## 2020-01-10 LAB — CBC WITH DIFFERENTIAL/PLATELET
Abs Immature Granulocytes: 0.08 10*3/uL — ABNORMAL HIGH (ref 0.00–0.07)
Basophils Absolute: 0 10*3/uL (ref 0.0–0.1)
Basophils Relative: 1 %
Eosinophils Absolute: 0.1 10*3/uL (ref 0.0–0.5)
Eosinophils Relative: 1 %
HCT: 28.2 % — ABNORMAL LOW (ref 36.0–46.0)
Hemoglobin: 9.2 g/dL — ABNORMAL LOW (ref 12.0–15.0)
Immature Granulocytes: 1 %
Lymphocytes Relative: 27 %
Lymphs Abs: 1.8 10*3/uL (ref 0.7–4.0)
MCH: 23.7 pg — ABNORMAL LOW (ref 26.0–34.0)
MCHC: 32.6 g/dL (ref 30.0–36.0)
MCV: 72.5 fL — ABNORMAL LOW (ref 80.0–100.0)
Monocytes Absolute: 0.9 10*3/uL (ref 0.1–1.0)
Monocytes Relative: 13 %
Neutro Abs: 3.8 10*3/uL (ref 1.7–7.7)
Neutrophils Relative %: 57 %
Platelets: 264 10*3/uL (ref 150–400)
RBC: 3.89 MIL/uL (ref 3.87–5.11)
RDW: 16.5 % — ABNORMAL HIGH (ref 11.5–15.5)
Smear Review: NORMAL
WBC: 6.7 10*3/uL (ref 4.0–10.5)
nRBC: 0 % (ref 0.0–0.2)

## 2020-01-10 LAB — COMPREHENSIVE METABOLIC PANEL
ALT: 18 U/L (ref 0–44)
AST: 20 U/L (ref 15–41)
Albumin: 3.5 g/dL (ref 3.5–5.0)
Alkaline Phosphatase: 118 U/L (ref 38–126)
Anion gap: 9 (ref 5–15)
BUN: 7 mg/dL — ABNORMAL LOW (ref 8–23)
CO2: 25 mmol/L (ref 22–32)
Calcium: 8.6 mg/dL — ABNORMAL LOW (ref 8.9–10.3)
Chloride: 101 mmol/L (ref 98–111)
Creatinine, Ser: 0.77 mg/dL (ref 0.44–1.00)
GFR, Estimated: >60 mL/min (ref >60)
Glucose, Bld: 134 mg/dL — ABNORMAL HIGH (ref 70–99)
Potassium: 3.4 mmol/L — ABNORMAL LOW (ref 3.5–5.1)
Sodium: 135 mmol/L (ref 135–145)
Total Bilirubin: 0.5 mg/dL (ref 0.3–1.2)
Total Protein: 7.2 g/dL (ref 6.5–8.1)

## 2020-01-10 MED ORDER — HEPARIN SOD (PORK) LOCK FLUSH 100 UNIT/ML IV SOLN
500.0000 [IU] | Freq: Once | INTRAVENOUS | Status: AC
  Administered 2020-01-10: 500 [IU] via INTRAVENOUS
  Filled 2020-01-10: qty 5

## 2020-01-10 MED ORDER — SODIUM CHLORIDE 0.9% FLUSH
10.0000 mL | INTRAVENOUS | Status: DC | PRN
  Administered 2020-01-10: 10 mL via INTRAVENOUS
  Filled 2020-01-10: qty 10

## 2020-01-10 NOTE — Progress Notes (Signed)
[place] NOTE  Patient Care Team: [place] as PCP - General [name], [place], [place] (Family Medicine) Rico Junker, RN as Registered Nurse Theodore Demark, RN as Registered Nurse Clent Jacks, RN as Oncology Nurse Navigator  CHIEF COMPLAINTS/PURPOSE OF CONSULTATION: pancreas adenocarcinoma    Oncology History Overview Note  #Pancreas adenocarcinoma-Stage IB- [id number]-[EUS- Dr.Spaete; [facility]/GI; mass 22x68m; invading/abutting superior mesenteric vein; 2 enlarged lymph nodes peripancreatic/porta hepatis largest 10 x 5.8 mm-nonpathologic based on EUS criteria/no biopsy]-borderline resectable.  PET scan no evidence of distant metastatic disease.  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 BStevan BornSeptember]  # 01/02/2020-FOLFIRINOX; Neulasta.  # Borderline DM; HTN   # SURVIVORSHIP:   # GENETICS:   DIAGNOSIS: Pancreatic cancer  STAGE:  IB/borderline resectable;  GOALS: cure  CURRENT/MOST RECENT THERAPY : Neoadjuvant-FOLFIRINOX    Cancer of head of pancreas ([place]  12/04/2019 Initial Diagnosis   Cancer of head of pancreas ([place]   12/19/2019 - 12/19/2019 Chemotherapy   The patient had PACLitaxel-protein bound (ABRAXANE) chemo infusion 250 mg, 125 mg/m2 = 250 mg, Intravenous,  Once, 1 of 4 cycles Administration: 250 mg (12/19/2019) gemcitabine (GEMZAR) 2,000 mg in sodium chloride 0.9 % 250 mL chemo infusion, 1,976 mg, Intravenous,  Once, 1 of 4 cycles Administration: 2,000 mg (12/19/2019)  for chemotherapy treatment.    01/02/2020 -  Chemotherapy   The patient had dexamethasone (DECADRON) 4 MG tablet, 8 mg, Oral, Daily, 1 of 1 cycle, Start date: --, End date: -- palonosetron (ALOXI) injection 0.25 mg, 0.25 mg, Intravenous,  Once, 1 of 8 cycles Administration: 0.25 mg (01/02/2020) pegfilgrastim (NEULASTA  ONPRO KIT) injection 6 mg, 6 mg, Subcutaneous, Once, 0 of 7 cycles pegfilgrastim-cbqv (UDENYCA) injection 6 mg, 6 mg, Subcutaneous, Once, 1 of 8 cycles irinotecan (CAMPTOSAR) 300 mg in sodium chloride 0.9 % 500 mL chemo infusion, 150 mg/m2 = 300 mg, Intravenous,  Once, 1 of 8 cycles Administration: 300 mg (01/02/2020) oxaliplatin (ELOXATIN) 165 mg in dextrose 5 % 500 mL chemo infusion, 85 mg/m2 = 165 mg, Intravenous,  Once, 1 of 8 cycles Administration: 165 mg (01/02/2020) fosaprepitant (EMEND) 150 mg in sodium chloride 0.9 % 145 mL IVPB, 150 mg, Intravenous,  Once, 1 of 8 cycles Administration: 150 mg (01/02/2020) fluorouracil (ADRUCIL) 4,700 mg in sodium chloride 0.9 % 56 mL chemo infusion, 2,400 mg/m2 = 4,700 mg, Intravenous, 1 Day/Dose, 1 of 8 cycles Administration: 4,700 mg (01/02/2020) leucovorin 800 mg in sodium chloride 0.9 % 250 mL infusion, 784 mg, Intravenous,  Once, 1 of 8 cycles Administration: 800 mg (01/02/2020)  for chemotherapy treatment.       HISTORY OF PRESENTING ILLNESS:  Robin RIEMENSCHNEIDER89y.[id number].  female borderline resectable pancreatic adeno ca is here for a follow up after neoadjuvant chemotherapy with FOLFIRINOX.  Patient received cycle #1 chemotherapy 1 week ago.  No nausea no vomiting no diarrhea.  Appetite is good.  No weight loss.  No tingling or numbness.  No warmth no joint pains.  Review of Systems  Constitutional: Positive for weight loss. Negative for chills, diaphoresis, fever and malaise/fatigue.  HENT: Negative for nosebleeds and sore throat.   Eyes: Negative for double vision.  Respiratory: Negative for cough, hemoptysis, sputum production, shortness of breath and wheezing.   Cardiovascular: Negative for chest pain, palpitations, orthopnea and leg swelling.  Gastrointestinal: Negative for abdominal pain, blood in stool, constipation,  diarrhea, heartburn, melena, nausea and vomiting.  Genitourinary: Negative for dysuria, frequency and urgency.   Musculoskeletal: Negative for back pain and joint pain.  Skin: Negative.  Negative for itching and rash.  Neurological: Negative for dizziness, tingling, focal weakness, weakness and headaches.  Endo/Heme/Allergies: Does not bruise/bleed easily.  Psychiatric/Behavioral: Negative for depression. The patient is not nervous/anxious and does not have insomnia.      MEDICAL HISTORY:  Past Medical History:  Diagnosis Date  . Anemia    history of  . Anxiety 09/11/2014  . Arthritis   . Cancer of head of pancreas ([place]) 12/04/2019  . Diabetes mellitus without complication ([place])   . Fluttering heart   . Hyperlipidemia   . Hypertension     SURGICAL HISTORY: Past Surgical History:  Procedure Laterality Date  . ENDOSCOPIC RETROGRADE CHOLANGIOPANCREATOGRAPHY (ERCP) WITH PROPOFOL N/A 11/16/2019   Procedure: ENDOSCOPIC RETROGRADE CHOLANGIOPANCREATOGRAPHY (ERCP) WITH PROPOFOL;  Surgeon: Lucilla Lame, MD;  Location: [facility] ENDOSCOPY;  Service: Endoscopy;  Laterality: N/A;  . EUS N/A 11/29/2019   Procedure: FULL UPPER ENDOSCOPIC ULTRASOUND (EUS) RADIAL;  Surgeon: Reita Cliche, MD;  Location: [facility] ENDOSCOPY;  Service: Gastroenterology;  Laterality: N/A;  . IR IMAGING GUIDED PORT INSERTION  12/14/2019  . right knee replacement Right [id number]  . SHOULDER ARTHROSCOPY W/ ROTATOR CUFF REPAIR Right     SOCIAL HISTORY: Social History   Socioeconomic History  . Marital status: Divorced    Spouse name: Not on file  . Number of children: Not on file  . Years of education: Not on file  . Highest education level: Not on file  Occupational History  . Not on file  Tobacco Use  . Smoking status: Never Smoker  . Smokeless tobacco: Never Used  Vaping Use  . Vaping Use: Never used  Substance and Sexual Activity  . Alcohol use: No    Alcohol/week: 0.0 standard drinks  . Drug use: No  . Sexual activity: Never  Other Topics Concern  . Not on file  Social History Narrative   Lives in [place]; with mom  and son; worked in dietary; never smoked; no alcohol.    Social Determinants of Health   Financial Resource Strain:   . Difficulty of Paying Living Expenses: Not on file  Food Insecurity:   . Worried About Charity fundraiser in the Last Year: Not on file  . Ran Out of Food in the Last Year: Not on file  Transportation Needs:   . Lack of Transportation (Medical): Not on file  . Lack of Transportation (Non-Medical): Not on file  Physical Activity:   . Days of Exercise per Week: Not on file  . Minutes of Exercise per Session: Not on file  Stress:   . Feeling of Stress : Not on file  Social Connections:   . Frequency of Communication with Friends and Family: Not on file  . Frequency of Social Gatherings with Friends and Family: Not on file  . Attends Religious Services: Not on file  . Active Member of Clubs or Organizations: Not on file  . Attends Archivist Meetings: Not on file  . Marital Status: Not on file  Intimate Partner Violence:   . Fear of Current or Ex-Partner: Not on file  . Emotionally Abused: Not on file  . Physically Abused: Not on file  . Sexually Abused: Not on file    FAMILY HISTORY: Family History  Problem Relation Age of Onset  . Diabetes Mother   . Hyperlipidemia Mother   .  Hypertension Mother   . Vision loss Mother   . Arthritis Father   . Hyperlipidemia Father   . Hypertension Father   . Breast cancer Maternal Aunt     ALLERGIES:  has No Known Allergies.  MEDICATIONS:  Current Outpatient Medications  Medication Sig Dispense Refill  . acetaminophen (TYLENOL) 650 MG CR tablet Take 1 tablet by mouth every 8 (eight) hours as needed.    . diphenoxylate-atropine (LOMOTIL) 2.5-0.025 MG tablet Take 1 tablet by mouth 4 (four) times daily as needed for diarrhea or loose stools. Take it along with immodium 45 tablet 0  . metoprolol succinate (TOPROL-XL) 50 MG 24 hr tablet Take 50 mg by mouth daily. Take with or immediately following a meal.     .  Potassium Chloride ER 20 MEQ TBCR Take 20 mEq by mouth daily.     . vitamin B-12 (CYANOCOBALAMIN) 1000 MCG tablet Take 1 tablet (1,000 mcg total) by mouth daily.    Marland Kitchen amLODipine (NORVASC) 10 MG tablet Take 10 mg by mouth daily. (Patient not taking: Reported on 01/10/2020)     No current facility-administered medications for this visit.      Marland Kitchen  PHYSICAL EXAMINATION: ECOG PERFORMANCE STATUS: 1 - Symptomatic but completely ambulatory  Vitals:   01/10/20 1122  Pulse: 88  Resp: 18  Temp: 97.7 F (36.5 C)  SpO2: 100%   Filed Weights   01/10/20 1122  Weight: 190 lb 2 oz (86.2 kg)    Physical Exam HENT:     Head: Normocephalic and atraumatic.     Mouth/Throat:     Pharynx: No oropharyngeal exudate.  Eyes:     Pupils: Pupils are equal, round, and reactive to light.  Cardiovascular:     Rate and Rhythm: Normal rate and regular rhythm.  Pulmonary:     Effort: Pulmonary effort is normal. No respiratory distress.     Breath sounds: Normal breath sounds. No wheezing.  Abdominal:     General: Bowel sounds are normal. There is no distension.     Palpations: Abdomen is soft. There is no mass.     Tenderness: There is no abdominal tenderness. There is no guarding or rebound.  Musculoskeletal:        General: No tenderness. Normal range of motion.     Cervical back: Normal range of motion and neck supple.  Skin:    General: Skin is warm.  Neurological:     Mental Status: She is alert and oriented to person, place, and time.  Psychiatric:        Mood and Affect: Affect normal.      LABORATORY DATA:  I have reviewed the data as listed Lab Results  Component Value Date   WBC 6.7 01/10/2020   HGB 9.2 (L) 01/10/2020   HCT 28.2 (L) 01/10/2020   MCV 72.5 (L) 01/10/2020   PLT 264 01/10/2020   Recent Labs    11/21/19 1449 11/21/19 1449 12/04/19 1327 12/04/19 1327 12/19/19 0833 01/02/20 0831 01/10/20 1057  NA 133*   < > 136   < > 139 138 135  K 4.2   < > 3.6   < > 3.2*  3.4* 3.4*  CL 91*   < > 102   < > 102 102 101  CO2 30   < > 26   < > 26 24 25   GLUCOSE 169*   < > 126*   < > 168* 113* 134*  BUN 15   < > 7*   < >  8 8 7*  CREATININE 1.02*   < > 0.90   < > 0.89 0.76 0.77  CALCIUM 9.0   < > 8.7*   < > 8.9 8.8* 8.6*  GFRNONAA 57*   < > >60   < > >60 >60 >60  GFRAA >60  --  >60  --  >60  --   --   PROT 8.3*   < > 7.3   < > 7.6 7.5 7.2  ALBUMIN 3.4*   < > 3.5   < > 3.5 3.7 3.5  AST 45*   < > 19   < > 35 21 20  ALT 137*   < > 22   < > 50* 20 18  ALKPHOS 405*   < > 156*   < > 294* 156* 118  BILITOT 1.8*   < > 0.9   < > 0.7 0.5 0.5   < > = values in this interval not displayed.    RADIOGRAPHIC STUDIES: I have personally reviewed the radiological images as listed and agreed with the findings in the report. No results found.  ASSESSMENT & PLAN:   Cancer of head of pancreas ([place]) #  Pancreas adenocarcinoma-Stage IB-/boderline resectable ;Given the abutment of the SMV;  neoadjuvant chemotherapy- FOLFIRINX.  #S/p FOLFIRINOX cycle number 1 day 8 today-tolerated extremely well without any major side effects.  Continue FOLFIRINOX cycle #2 next week.  # Biliary obstruction status post ERCP and stenting-secondary to above malignancy-alkaline phosphatase 154; however today improved/normal.  Monitor closely.   # DISPOSITION: # NO IVF today; de-access # as planned- 1 weeks-MD; labs- cbc.cmp;ca-19-9; FOLFIRINOX; d-3 pump -OFF-Dr.B   All questions were answered. The patient knows to call the clinic with any problems, questions or concerns.    Cammie Sickle, MD 01/13/2020 3:56 PM

## 2020-01-10 NOTE — Assessment & Plan Note (Addendum)
#    Pancreas adenocarcinoma-Stage IB-/boderline resectable ;Given the abutment of the SMV;  neoadjuvant chemotherapy- FOLFIRINX.  #S/p FOLFIRINOX cycle number 1 day 8 today-tolerated extremely well without any major side effects.  Continue FOLFIRINOX cycle #2 next week.  # Biliary obstruction status post ERCP and stenting-secondary to above malignancy-alkaline phosphatase 154; however today improved/normal.  Monitor closely.   # DISPOSITION: # NO IVF today; de-access # as planned- 1 weeks-MD; labs- cbc.cmp;ca-19-9; FOLFIRINOX; d-3 pump -OFF-Dr.B

## 2020-01-16 ENCOUNTER — Other Ambulatory Visit: Payer: Self-pay

## 2020-01-16 ENCOUNTER — Inpatient Hospital Stay: Payer: Medicare Other

## 2020-01-16 ENCOUNTER — Inpatient Hospital Stay (HOSPITAL_BASED_OUTPATIENT_CLINIC_OR_DEPARTMENT_OTHER): Payer: Medicare Other | Admitting: Internal Medicine

## 2020-01-16 ENCOUNTER — Encounter: Payer: Self-pay | Admitting: Internal Medicine

## 2020-01-16 VITALS — BP 167/94 | HR 94 | Temp 97.2°F | Resp 16 | Ht 63.0 in | Wt 192.4 lb

## 2020-01-16 VITALS — BP 156/82 | HR 88 | Temp 97.3°F | Resp 20

## 2020-01-16 DIAGNOSIS — C250 Malignant neoplasm of head of pancreas: Secondary | ICD-10-CM

## 2020-01-16 DIAGNOSIS — Z5111 Encounter for antineoplastic chemotherapy: Secondary | ICD-10-CM | POA: Diagnosis not present

## 2020-01-16 DIAGNOSIS — Z95828 Presence of other vascular implants and grafts: Secondary | ICD-10-CM

## 2020-01-16 LAB — CBC WITH DIFFERENTIAL/PLATELET
Abs Immature Granulocytes: 0.66 10*3/uL — ABNORMAL HIGH (ref 0.00–0.07)
Basophils Absolute: 0.1 10*3/uL (ref 0.0–0.1)
Basophils Relative: 1 %
Eosinophils Absolute: 0.2 10*3/uL (ref 0.0–0.5)
Eosinophils Relative: 3 %
HCT: 31.4 % — ABNORMAL LOW (ref 36.0–46.0)
Hemoglobin: 10 g/dL — ABNORMAL LOW (ref 12.0–15.0)
Immature Granulocytes: 7 %
Lymphocytes Relative: 26 %
Lymphs Abs: 2.4 10*3/uL (ref 0.7–4.0)
MCH: 23.3 pg — ABNORMAL LOW (ref 26.0–34.0)
MCHC: 31.8 g/dL (ref 30.0–36.0)
MCV: 73.2 fL — ABNORMAL LOW (ref 80.0–100.0)
Monocytes Absolute: 0.6 10*3/uL (ref 0.1–1.0)
Monocytes Relative: 7 %
Neutro Abs: 5.2 10*3/uL (ref 1.7–7.7)
Neutrophils Relative %: 56 %
Platelets: 257 10*3/uL (ref 150–400)
RBC: 4.29 MIL/uL (ref 3.87–5.11)
RDW: 17.5 % — ABNORMAL HIGH (ref 11.5–15.5)
WBC: 9.2 10*3/uL (ref 4.0–10.5)
nRBC: 0.2 % (ref 0.0–0.2)

## 2020-01-16 LAB — COMPREHENSIVE METABOLIC PANEL
ALT: 19 U/L (ref 0–44)
AST: 21 U/L (ref 15–41)
Albumin: 3.5 g/dL (ref 3.5–5.0)
Alkaline Phosphatase: 109 U/L (ref 38–126)
Anion gap: 9 (ref 5–15)
BUN: 6 mg/dL — ABNORMAL LOW (ref 8–23)
CO2: 26 mmol/L (ref 22–32)
Calcium: 8.6 mg/dL — ABNORMAL LOW (ref 8.9–10.3)
Chloride: 102 mmol/L (ref 98–111)
Creatinine, Ser: 0.9 mg/dL (ref 0.44–1.00)
GFR, Estimated: >60 mL/min (ref >60)
Glucose, Bld: 128 mg/dL — ABNORMAL HIGH (ref 70–99)
Potassium: 3.1 mmol/L — ABNORMAL LOW (ref 3.5–5.1)
Sodium: 137 mmol/L (ref 135–145)
Total Bilirubin: 0.3 mg/dL (ref 0.3–1.2)
Total Protein: 7.2 g/dL (ref 6.5–8.1)

## 2020-01-16 MED ORDER — SODIUM CHLORIDE 0.9 % IV SOLN
150.0000 mg | Freq: Once | INTRAVENOUS | Status: AC
  Administered 2020-01-16: 150 mg via INTRAVENOUS
  Filled 2020-01-16: qty 150

## 2020-01-16 MED ORDER — HEPARIN SOD (PORK) LOCK FLUSH 100 UNIT/ML IV SOLN
INTRAVENOUS | Status: AC
  Filled 2020-01-16: qty 5

## 2020-01-16 MED ORDER — POTASSIUM CHLORIDE ER 20 MEQ PO TBCR: 20.0000 meq | EXTENDED_RELEASE_TABLET | Freq: Two times a day (BID) | ORAL | 1 refills | Status: DC

## 2020-01-16 MED ORDER — DEXTROSE 5 % IV SOLN
Freq: Once | INTRAVENOUS | Status: AC
  Filled 2020-01-16: qty 250

## 2020-01-16 MED ORDER — SODIUM CHLORIDE 0.9 % IV SOLN
150.0000 mg/m2 | Freq: Once | INTRAVENOUS | Status: AC
  Administered 2020-01-16: 300 mg via INTRAVENOUS
  Filled 2020-01-16: qty 15

## 2020-01-16 MED ORDER — ATROPINE SULFATE 1 MG/ML IJ SOLN
0.5000 mg | Freq: Once | INTRAMUSCULAR | Status: AC | PRN
  Administered 2020-01-16: 0.5 mg via INTRAVENOUS
  Filled 2020-01-16: qty 1

## 2020-01-16 MED ORDER — SODIUM CHLORIDE 0.9 % IV SOLN
10.0000 mg | Freq: Once | INTRAVENOUS | Status: AC
  Administered 2020-01-16: 10 mg via INTRAVENOUS
  Filled 2020-01-16: qty 10

## 2020-01-16 MED ORDER — SODIUM CHLORIDE 0.9 % IV SOLN
2400.0000 mg/m2 | INTRAVENOUS | Status: DC
  Administered 2020-01-16: 4700 mg via INTRAVENOUS
  Filled 2020-01-16: qty 94

## 2020-01-16 MED ORDER — PALONOSETRON HCL INJECTION 0.25 MG/5ML
0.2500 mg | Freq: Once | INTRAVENOUS | Status: AC
  Administered 2020-01-16: 0.25 mg via INTRAVENOUS
  Filled 2020-01-16: qty 5

## 2020-01-16 MED ORDER — SODIUM CHLORIDE 0.9% FLUSH
10.0000 mL | Freq: Once | INTRAVENOUS | Status: AC
  Administered 2020-01-16: 10 mL via INTRAVENOUS
  Filled 2020-01-16: qty 10

## 2020-01-16 MED ORDER — SODIUM CHLORIDE 0.9 % IV SOLN
408.0000 mg/m2 | Freq: Once | INTRAVENOUS | Status: AC
  Administered 2020-01-16: 800 mg via INTRAVENOUS
  Filled 2020-01-16: qty 40

## 2020-01-16 MED ORDER — HEPARIN SOD (PORK) LOCK FLUSH 100 UNIT/ML IV SOLN
500.0000 [IU] | Freq: Once | INTRAVENOUS | Status: DC
  Filled 2020-01-16: qty 5

## 2020-01-16 MED ORDER — SODIUM CHLORIDE 0.9% FLUSH
10.0000 mL | INTRAVENOUS | Status: DC | PRN
  Administered 2020-01-16: 10 mL
  Filled 2020-01-16: qty 10

## 2020-01-16 MED ORDER — OXALIPLATIN CHEMO INJECTION 100 MG/20ML
85.0000 mg/m2 | Freq: Once | INTRAVENOUS | Status: AC
  Administered 2020-01-16: 165 mg via INTRAVENOUS
  Filled 2020-01-16: qty 33

## 2020-01-16 NOTE — Progress Notes (Signed)
[place] NOTE  Patient Care Team: [place] as PCP - General [name], [place], [place] (Family Medicine) Rico Junker, RN as Registered Nurse Theodore Demark, RN as Registered Nurse Clent Jacks, RN as Oncology Nurse Navigator  CHIEF COMPLAINTS/PURPOSE OF CONSULTATION: pancreas adenocarcinoma   Oncology History Overview Note  #Pancreas adenocarcinoma-Stage IB- [id number]-[EUS- Dr.Spaete; [facility]/GI; mass 22x32m; invading/abutting superior mesenteric vein; 2 enlarged lymph nodes peripancreatic/porta hepatis largest 10 x 5.8 mm-nonpathologic based on EUS criteria/no biopsy]-borderline resectable.  PET scan no evidence of distant metastatic disease.  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 BStevan BornSeptember]  # 01/02/2020-FOLFIRINOX; Neulasta.  # Borderline DM; HTN   # SURVIVORSHIP:   # GENETICS:   DIAGNOSIS: Pancreatic cancer  STAGE:  IB/borderline resectable;  GOALS: cure  CURRENT/MOST RECENT THERAPY : Neoadjuvant-FOLFIRINOX    Cancer of head of pancreas ([place]  12/04/2019 Initial Diagnosis   Cancer of head of pancreas ([place]   12/19/2019 - 12/19/2019 Chemotherapy   The patient had PACLitaxel-protein bound (ABRAXANE) chemo infusion 250 mg, 125 mg/m2 = 250 mg, Intravenous,  Once, 1 of 4 cycles Administration: 250 mg (12/19/2019) gemcitabine (GEMZAR) 2,000 mg in sodium chloride 0.9 % 250 mL chemo infusion, 1,976 mg, Intravenous,  Once, 1 of 4 cycles Administration: 2,000 mg (12/19/2019)  for chemotherapy treatment.    01/02/2020 -  Chemotherapy   The patient had dexamethasone (DECADRON) 4 MG tablet, 8 mg, Oral, Daily, 1 of 1 cycle, Start date: --, End date: -- palonosetron (ALOXI) injection 0.25 mg, 0.25 mg, Intravenous,  Once, 2 of 8 cycles Administration: 0.25 mg (01/02/2020) pegfilgrastim (NEULASTA  ONPRO KIT) injection 6 mg, 6 mg, Subcutaneous, Once, 1 of 7 cycles pegfilgrastim-cbqv (UDENYCA) injection 6 mg, 6 mg, Subcutaneous, Once, 2 of 8 cycles Administration: 6 mg (01/04/2020) irinotecan (CAMPTOSAR) 300 mg in sodium chloride 0.9 % 500 mL chemo infusion, 150 mg/m2 = 300 mg, Intravenous,  Once, 2 of 8 cycles Administration: 300 mg (01/02/2020) oxaliplatin (ELOXATIN) 165 mg in dextrose 5 % 500 mL chemo infusion, 85 mg/m2 = 165 mg, Intravenous,  Once, 2 of 8 cycles Administration: 165 mg (01/02/2020) fosaprepitant (EMEND) 150 mg in sodium chloride 0.9 % 145 mL IVPB, 150 mg, Intravenous,  Once, 2 of 8 cycles Administration: 150 mg (01/02/2020) fluorouracil (ADRUCIL) 4,700 mg in sodium chloride 0.9 % 56 mL chemo infusion, 2,400 mg/m2 = 4,700 mg, Intravenous, 1 Day/Dose, 2 of 8 cycles Administration: 4,700 mg (01/02/2020) leucovorin 800 mg in sodium chloride 0.9 % 250 mL infusion, 784 mg, Intravenous,  Once, 2 of 8 cycles Administration: 800 mg (01/02/2020)  for chemotherapy treatment.       HISTORY OF PRESENTING ILLNESS:  Robin HIZER89y.o.  female borderline resectable pancreatic adeno ca currently on neoadjuvant chemotherapy with FOLFIRINOX.  Patient received cycle #1 chemotherapy 2 week ago.  Patient complains of one episode of pain in the jaw while chewing food.  Otherwise no tingling or numbness.  No nausea no vomiting.  No diarrhea.  Mild joint pains.  Not any worse  Review of Systems  Constitutional: Positive for weight loss. Negative for chills, diaphoresis, fever and malaise/fatigue.  HENT: Negative for nosebleeds and sore throat.   Eyes: Negative for double vision.  Respiratory: Negative for cough, hemoptysis, sputum production, shortness of breath and wheezing.   Cardiovascular: Negative for chest pain, palpitations, orthopnea and leg swelling.  Gastrointestinal:  Negative for abdominal pain, blood in stool, constipation, diarrhea, heartburn, melena, nausea and vomiting.   Genitourinary: Negative for dysuria, frequency and urgency.  Musculoskeletal: Negative for back pain and joint pain.  Skin: Negative.  Negative for itching and rash.  Neurological: Negative for dizziness, tingling, focal weakness, weakness and headaches.  Endo/Heme/Allergies: Does not bruise/bleed easily.  Psychiatric/Behavioral: Negative for depression. The patient is not nervous/anxious and does not have insomnia.      MEDICAL HISTORY:  Past Medical History:  Diagnosis Date  . Anemia    history of  . Anxiety 09/11/2014  . Arthritis   . Cancer of head of pancreas ([place]) 12/04/2019  . Diabetes mellitus without complication ([place])   . Fluttering heart   . Hyperlipidemia   . Hypertension     SURGICAL HISTORY: Past Surgical History:  Procedure Laterality Date  . ENDOSCOPIC RETROGRADE CHOLANGIOPANCREATOGRAPHY (ERCP) WITH PROPOFOL N/A 11/16/2019   Procedure: ENDOSCOPIC RETROGRADE CHOLANGIOPANCREATOGRAPHY (ERCP) WITH PROPOFOL;  Surgeon: Lucilla Lame, MD;  Location: [facility] ENDOSCOPY;  Service: Endoscopy;  Laterality: N/A;  . EUS N/A 11/29/2019   Procedure: FULL UPPER ENDOSCOPIC ULTRASOUND (EUS) RADIAL;  Surgeon: Reita Cliche, MD;  Location: [facility] ENDOSCOPY;  Service: Gastroenterology;  Laterality: N/A;  . IR IMAGING GUIDED PORT INSERTION  12/14/2019  . right knee replacement Right [id number]  . SHOULDER ARTHROSCOPY W/ ROTATOR CUFF REPAIR Right     SOCIAL HISTORY: Social History   Socioeconomic History  . Marital status: Divorced    Spouse name: Not on file  . Number of children: Not on file  . Years of education: Not on file  . Highest education level: Not on file  Occupational History  . Not on file  Tobacco Use  . Smoking status: Never Smoker  . Smokeless tobacco: Never Used  Vaping Use  . Vaping Use: Never used  Substance and Sexual Activity  . Alcohol use: No    Alcohol/week: 0.0 standard drinks  . Drug use: No  . Sexual activity: Never  Other Topics Concern  . Not on  file  Social History Narrative   Lives in [place]; with mom and son; worked in dietary; never smoked; no alcohol.    Social Determinants of Health   Financial Resource Strain:   . Difficulty of Paying Living Expenses: Not on file  Food Insecurity:   . Worried About Charity fundraiser in the Last Year: Not on file  . Ran Out of Food in the Last Year: Not on file  Transportation Needs:   . Lack of Transportation (Medical): Not on file  . Lack of Transportation (Non-Medical): Not on file  Physical Activity:   . Days of Exercise per Week: Not on file  . Minutes of Exercise per Session: Not on file  Stress:   . Feeling of Stress : Not on file  Social Connections:   . Frequency of Communication with Friends and Family: Not on file  . Frequency of Social Gatherings with Friends and Family: Not on file  . Attends Religious Services: Not on file  . Active Member of Clubs or Organizations: Not on file  . Attends Archivist Meetings: Not on file  . Marital Status: Not on file  Intimate Partner Violence:   . Fear of Current or Ex-Partner: Not on file  . Emotionally Abused: Not on file  . Physically Abused: Not on file  . Sexually Abused: Not on file    FAMILY HISTORY: Family History  Problem Relation Age of Onset  .  Diabetes Mother   . Hyperlipidemia Mother   . Hypertension Mother   . Vision loss Mother   . Arthritis Father   . Hyperlipidemia Father   . Hypertension Father   . Breast cancer Maternal Aunt     ALLERGIES:  has No Known Allergies.  MEDICATIONS:  Current Outpatient Medications  Medication Sig Dispense Refill  . acetaminophen (TYLENOL) 650 MG CR tablet Take 1 tablet by mouth every 8 (eight) hours as needed.    Marland Kitchen amLODipine (NORVASC) 10 MG tablet Take 10 mg by mouth daily.     . diphenoxylate-atropine (LOMOTIL) 2.5-0.025 MG tablet Take 1 tablet by mouth 4 (four) times daily as needed for diarrhea or loose stools. Take it along with immodium 45 tablet 0   . metoprolol succinate (TOPROL-XL) 50 MG 24 hr tablet Take 50 mg by mouth daily. Take with or immediately following a meal.     . Potassium Chloride ER 20 MEQ TBCR Take 20 mEq by mouth in the morning and at bedtime. 60 tablet 1  . vitamin B-12 (CYANOCOBALAMIN) 1000 MCG tablet Take 1 tablet (1,000 mcg total) by mouth daily.     No current facility-administered medications for this visit.   Facility-Administered Medications Ordered in Other Visits  Medication Dose Route Frequency Provider Last Rate Last Admin  . dexamethasone (DECADRON) 10 mg in sodium chloride 0.9 % 50 mL IVPB  10 mg Intravenous Once Charlaine Dalton R, MD      . fluorouracil (ADRUCIL) 4,700 mg in sodium chloride 0.9 % 56 mL chemo infusion  2,400 mg/m2 (Treatment Plan Recorded) Intravenous 1 day or 1 dose Charlaine Dalton R, MD      . fosaprepitant (EMEND) 150 mg in sodium chloride 0.9 % 145 mL IVPB  150 mg Intravenous Once Charlaine Dalton R, MD      . heparin lock flush 100 unit/mL  500 Units Intravenous Once Charlaine Dalton R, MD      . irinotecan (CAMPTOSAR) 300 mg in sodium chloride 0.9 % 500 mL chemo infusion  150 mg/m2 (Treatment Plan Recorded) Intravenous Once Charlaine Dalton R, MD      . leucovorin 800 mg in sodium chloride 0.9 % 250 mL infusion  408 mg/m2 (Treatment Plan Recorded) Intravenous Once Cammie Sickle, MD      . oxaliplatin (ELOXATIN) 165 mg in dextrose 5 % 500 mL chemo infusion  85 mg/m2 (Treatment Plan Recorded) Intravenous Once Cammie Sickle, MD          .  PHYSICAL EXAMINATION: ECOG PERFORMANCE STATUS: 1 - Symptomatic but completely ambulatory  Vitals:   01/16/20 0858  BP: (!) 167/94  Pulse: 94  Resp: 16  Temp: (!) 97.2 F (36.2 C)  SpO2: 100%   Filed Weights   01/16/20 0858  Weight: 192 lb 6.4 oz (87.3 kg)    Physical Exam HENT:     Head: Normocephalic and atraumatic.     Mouth/Throat:     Pharynx: No oropharyngeal exudate.  Eyes:     Pupils: Pupils  are equal, round, and reactive to light.  Cardiovascular:     Rate and Rhythm: Normal rate and regular rhythm.  Pulmonary:     Effort: Pulmonary effort is normal. No respiratory distress.     Breath sounds: Normal breath sounds. No wheezing.  Abdominal:     General: Bowel sounds are normal. There is no distension.     Palpations: Abdomen is soft. There is no mass.     Tenderness: There is no  abdominal tenderness. There is no guarding or rebound.  Musculoskeletal:        General: No tenderness. Normal range of motion.     Cervical back: Normal range of motion and neck supple.  Skin:    General: Skin is warm.  Neurological:     Mental Status: She is alert and oriented to person, place, and time.  Psychiatric:        Mood and Affect: Affect normal.      LABORATORY DATA:  I have reviewed the data as listed Lab Results  Component Value Date   WBC 9.2 01/16/2020   HGB 10.0 (L) 01/16/2020   HCT 31.4 (L) 01/16/2020   MCV 73.2 (L) 01/16/2020   PLT 257 01/16/2020   Recent Labs    11/21/19 1449 11/21/19 1449 12/04/19 1327 12/04/19 1327 12/19/19 0833 12/19/19 0833 01/02/20 0831 01/10/20 1057 01/16/20 0834  NA 133*   < > 136   < > 139   < > 138 135 137  K 4.2   < > 3.6   < > 3.2*   < > 3.4* 3.4* 3.1*  CL 91*   < > 102   < > 102   < > 102 101 102  CO2 30   < > 26   < > 26   < > 24 25 26   GLUCOSE 169*   < > 126*   < > 168*   < > 113* 134* 128*  BUN 15   < > 7*   < > 8   < > 8 7* 6*  CREATININE 1.02*   < > 0.90   < > 0.89   < > 0.76 0.77 0.90  CALCIUM 9.0   < > 8.7*   < > 8.9   < > 8.8* 8.6* 8.6*  GFRNONAA 57*   < > >60   < > >60   < > >60 >60 >60  GFRAA >60  --  >60  --  >60  --   --   --   --   PROT 8.3*   < > 7.3   < > 7.6   < > 7.5 7.2 7.2  ALBUMIN 3.4*   < > 3.5   < > 3.5   < > 3.7 3.5 3.5  AST 45*   < > 19   < > 35   < > 21 20 21   ALT 137*   < > 22   < > 50*   < > 20 18 19   ALKPHOS 405*   < > 156*   < > 294*   < > 156* 118 109  BILITOT 1.8*   < > 0.9   < > 0.7   < >  0.5 0.5 0.3   < > = values in this interval not displayed.    RADIOGRAPHIC STUDIES: I have personally reviewed the radiological images as listed and agreed with the findings in the report. No results found.  ASSESSMENT & PLAN:   Cancer of head of pancreas ([place]) #  Pancreas adenocarcinoma-Stage IB-/boderline resectable ;Given the abutment of the SMV;  neoadjuvant chemotherapy- on FOLFIRINX.  # proceed with cycle #2 Continue FOLFIRINOX. Labs today reviewed;  acceptable for treatment today.   # MIld Hypokalemia- 3.1; on Kur 20 daily; increase to 20 BID.;new scipt given.  #Pain in the jaw bilateral x1 episode.  No concern for osteonecrosis of the jaw.-Question related to oxaliplatin.  Monitor for now.  # Biliary obstruction  status post ERCP and stenting-secondary to above malignancy-LFt- WNL.   # DISPOSITION: # chemo as planned. D-3 pump off; add undeyca  # 1 week- labs- cbc/bmp  # 2 weeks-MD; labs- cbc.cmp;ca-19-9; FOLFIRINOX; d-3 pump-OFF;udenyca Dr.B   All questions were answered. The patient knows to call the clinic with any problems, questions or concerns.    Cammie Sickle, MD 01/16/2020 9:51 AM

## 2020-01-16 NOTE — Progress Notes (Signed)
1702- Patient and vital signs stable. Patient discharged to home with Fluorouracil Pump in place at this time.

## 2020-01-16 NOTE — Assessment & Plan Note (Addendum)
#    Pancreas adenocarcinoma-Stage IB-/boderline resectable ;Given the abutment of the SMV;  neoadjuvant chemotherapy- on FOLFIRINX.  # proceed with cycle #2 Continue FOLFIRINOX. Labs today reviewed;  acceptable for treatment today.   # MIld Hypokalemia- 3.1; on Kur 20 daily; increase to 20 BID.;new scipt given.  #Pain in the jaw bilateral x1 episode.  No concern for osteonecrosis of the jaw.-Question related to oxaliplatin.  Monitor for now.  # Biliary obstruction status post ERCP and stenting-secondary to above malignancy-LFt- WNL.   # DISPOSITION: # chemo as planned. D-3 pump off; add undeyca  # 1 week- labs- cbc/bmp  # 2 weeks-MD; labs- cbc.cmp;ca-19-9; FOLFIRINOX; d-3 pump-OFF;udenyca Dr.B

## 2020-01-16 NOTE — Progress Notes (Signed)
After last treatment made mention it was harder to eat and she got really dizzy.

## 2020-01-16 NOTE — Progress Notes (Addendum)
1345 pt reporting dizziness and right hand tingling. Tx stopped and Dr B notified. See flowsheet for VS. Atropine .5mg  given. Pt reporting resolution of symptoms. Per Dr B will restart tx  1415 When this RN attempted to restart tx, pt stated she was not comfortable restarting at this time since her symptom of dizziness had not completely resolved. This RN contacted Dr B to see what next step will be  1500 Dr B to chairside, pt reports resolution of symptoms. Tx restarted at this time

## 2020-01-16 NOTE — Addendum Note (Signed)
Addended by: Earnestine Leys on: 01/16/2020 10:10 AM   Modules accepted: Orders

## 2020-01-17 LAB — CANCER ANTIGEN 19-9: CA 19-9: 51 U/mL — ABNORMAL HIGH (ref 0–35)

## 2020-01-18 ENCOUNTER — Inpatient Hospital Stay: Payer: Medicare Other

## 2020-01-18 ENCOUNTER — Other Ambulatory Visit: Payer: Self-pay | Admitting: Internal Medicine

## 2020-01-18 VITALS — BP 154/83 | HR 92 | Temp 98.0°F | Resp 20

## 2020-01-18 DIAGNOSIS — Z5111 Encounter for antineoplastic chemotherapy: Secondary | ICD-10-CM | POA: Diagnosis not present

## 2020-01-18 DIAGNOSIS — C250 Malignant neoplasm of head of pancreas: Secondary | ICD-10-CM

## 2020-01-18 MED ORDER — SODIUM CHLORIDE 0.9% FLUSH
10.0000 mL | INTRAVENOUS | Status: DC | PRN
  Administered 2020-01-18: 10 mL
  Filled 2020-01-18: qty 10

## 2020-01-18 MED ORDER — PEGFILGRASTIM-CBQV 6 MG/0.6ML ~~LOC~~ SOSY
6.0000 mg | PREFILLED_SYRINGE | Freq: Once | SUBCUTANEOUS | Status: AC
  Administered 2020-01-18: 6 mg via SUBCUTANEOUS
  Filled 2020-01-18: qty 0.6

## 2020-01-18 MED ORDER — HEPARIN SOD (PORK) LOCK FLUSH 100 UNIT/ML IV SOLN
INTRAVENOUS | Status: AC
  Filled 2020-01-18: qty 5

## 2020-01-18 MED ORDER — HEPARIN SOD (PORK) LOCK FLUSH 100 UNIT/ML IV SOLN
500.0000 [IU] | Freq: Once | INTRAVENOUS | Status: AC | PRN
  Administered 2020-01-18: 500 [IU]
  Filled 2020-01-18: qty 5

## 2020-01-18 NOTE — Progress Notes (Signed)
Discontinued onpro; will continue with udenyca.

## 2020-01-23 ENCOUNTER — Inpatient Hospital Stay: Payer: Medicare Other

## 2020-01-23 ENCOUNTER — Other Ambulatory Visit: Payer: Self-pay

## 2020-01-23 DIAGNOSIS — C250 Malignant neoplasm of head of pancreas: Secondary | ICD-10-CM

## 2020-01-23 DIAGNOSIS — Z5111 Encounter for antineoplastic chemotherapy: Secondary | ICD-10-CM | POA: Diagnosis not present

## 2020-01-23 LAB — CBC WITH DIFFERENTIAL/PLATELET
Abs Immature Granulocytes: 0.06 10*3/uL (ref 0.00–0.07)
Basophils Absolute: 0 10*3/uL (ref 0.0–0.1)
Basophils Relative: 1 %
Eosinophils Absolute: 0.1 10*3/uL (ref 0.0–0.5)
Eosinophils Relative: 2 %
HCT: 28.2 % — ABNORMAL LOW (ref 36.0–46.0)
Hemoglobin: 9.1 g/dL — ABNORMAL LOW (ref 12.0–15.0)
Immature Granulocytes: 1 %
Lymphocytes Relative: 19 %
Lymphs Abs: 1.2 10*3/uL (ref 0.7–4.0)
MCH: 23.6 pg — ABNORMAL LOW (ref 26.0–34.0)
MCHC: 32.3 g/dL (ref 30.0–36.0)
MCV: 73.2 fL — ABNORMAL LOW (ref 80.0–100.0)
Monocytes Absolute: 0.3 10*3/uL (ref 0.1–1.0)
Monocytes Relative: 4 %
Neutro Abs: 4.7 10*3/uL (ref 1.7–7.7)
Neutrophils Relative %: 73 %
Platelets: 139 10*3/uL — ABNORMAL LOW (ref 150–400)
RBC: 3.85 MIL/uL — ABNORMAL LOW (ref 3.87–5.11)
RDW: 17.2 % — ABNORMAL HIGH (ref 11.5–15.5)
Smear Review: NORMAL
WBC Morphology: 16
WBC: 6.4 10*3/uL (ref 4.0–10.5)
nRBC: 0 % (ref 0.0–0.2)

## 2020-01-23 LAB — BASIC METABOLIC PANEL
Anion gap: 8 (ref 5–15)
BUN: 10 mg/dL (ref 8–23)
CO2: 26 mmol/L (ref 22–32)
Calcium: 8.3 mg/dL — ABNORMAL LOW (ref 8.9–10.3)
Chloride: 101 mmol/L (ref 98–111)
Creatinine, Ser: 0.69 mg/dL (ref 0.44–1.00)
GFR, Estimated: >60 mL/min (ref >60)
Glucose, Bld: 118 mg/dL — ABNORMAL HIGH (ref 70–99)
Potassium: 4 mmol/L (ref 3.5–5.1)
Sodium: 135 mmol/L (ref 135–145)

## 2020-01-30 ENCOUNTER — Inpatient Hospital Stay: Payer: Medicare Other | Attending: Internal Medicine

## 2020-01-30 ENCOUNTER — Other Ambulatory Visit: Payer: Self-pay

## 2020-01-30 ENCOUNTER — Inpatient Hospital Stay (HOSPITAL_BASED_OUTPATIENT_CLINIC_OR_DEPARTMENT_OTHER): Payer: Medicare Other | Admitting: Internal Medicine

## 2020-01-30 ENCOUNTER — Inpatient Hospital Stay: Payer: Medicare Other

## 2020-01-30 ENCOUNTER — Encounter: Payer: Self-pay | Admitting: Internal Medicine

## 2020-01-30 VITALS — BP 165/88 | HR 93 | Resp 20

## 2020-01-30 VITALS — BP 184/97 | HR 96 | Temp 97.0°F | Resp 18 | Wt 192.0 lb

## 2020-01-30 DIAGNOSIS — C250 Malignant neoplasm of head of pancreas: Secondary | ICD-10-CM

## 2020-01-30 DIAGNOSIS — Z452 Encounter for adjustment and management of vascular access device: Secondary | ICD-10-CM | POA: Diagnosis not present

## 2020-01-30 DIAGNOSIS — I10 Essential (primary) hypertension: Secondary | ICD-10-CM | POA: Insufficient documentation

## 2020-01-30 DIAGNOSIS — Z5189 Encounter for other specified aftercare: Secondary | ICD-10-CM | POA: Insufficient documentation

## 2020-01-30 DIAGNOSIS — Z5111 Encounter for antineoplastic chemotherapy: Secondary | ICD-10-CM | POA: Insufficient documentation

## 2020-01-30 DIAGNOSIS — E876 Hypokalemia: Secondary | ICD-10-CM

## 2020-01-30 LAB — COMPREHENSIVE METABOLIC PANEL
ALT: 23 U/L (ref 0–44)
AST: 26 U/L (ref 15–41)
Albumin: 3.6 g/dL (ref 3.5–5.0)
Alkaline Phosphatase: 107 U/L (ref 38–126)
Anion gap: 13 (ref 5–15)
BUN: 6 mg/dL — ABNORMAL LOW (ref 8–23)
CO2: 24 mmol/L (ref 22–32)
Calcium: 8.9 mg/dL (ref 8.9–10.3)
Chloride: 101 mmol/L (ref 98–111)
Creatinine, Ser: 0.85 mg/dL (ref 0.44–1.00)
GFR, Estimated: >60 mL/min (ref >60)
Glucose, Bld: 116 mg/dL — ABNORMAL HIGH (ref 70–99)
Potassium: 2.9 mmol/L — ABNORMAL LOW (ref 3.5–5.1)
Sodium: 138 mmol/L (ref 135–145)
Total Bilirubin: 0.3 mg/dL (ref 0.3–1.2)
Total Protein: 7.5 g/dL (ref 6.5–8.1)

## 2020-01-30 LAB — CBC WITH DIFFERENTIAL/PLATELET
Abs Immature Granulocytes: 0.8 10*3/uL — ABNORMAL HIGH (ref 0.00–0.07)
Basophils Absolute: 0.1 10*3/uL (ref 0.0–0.1)
Basophils Relative: 1 %
Eosinophils Absolute: 0.1 10*3/uL (ref 0.0–0.5)
Eosinophils Relative: 1 %
HCT: 30.1 % — ABNORMAL LOW (ref 36.0–46.0)
Hemoglobin: 9.7 g/dL — ABNORMAL LOW (ref 12.0–15.0)
Immature Granulocytes: 8 %
Lymphocytes Relative: 24 %
Lymphs Abs: 2.5 10*3/uL (ref 0.7–4.0)
MCH: 23.5 pg — ABNORMAL LOW (ref 26.0–34.0)
MCHC: 32.2 g/dL (ref 30.0–36.0)
MCV: 73.1 fL — ABNORMAL LOW (ref 80.0–100.0)
Monocytes Absolute: 1 10*3/uL (ref 0.1–1.0)
Monocytes Relative: 10 %
Neutro Abs: 5.7 10*3/uL (ref 1.7–7.7)
Neutrophils Relative %: 56 %
Platelets: 270 10*3/uL (ref 150–400)
RBC: 4.12 MIL/uL (ref 3.87–5.11)
RDW: 18.2 % — ABNORMAL HIGH (ref 11.5–15.5)
WBC: 10.1 10*3/uL (ref 4.0–10.5)
nRBC: 0.4 % — ABNORMAL HIGH (ref 0.0–0.2)

## 2020-01-30 MED ORDER — SODIUM CHLORIDE 0.9 % IV SOLN
150.0000 mg | Freq: Once | INTRAVENOUS | Status: AC
  Administered 2020-01-30: 150 mg via INTRAVENOUS
  Filled 2020-01-30: qty 150

## 2020-01-30 MED ORDER — HEPARIN SOD (PORK) LOCK FLUSH 100 UNIT/ML IV SOLN
500.0000 [IU] | Freq: Once | INTRAVENOUS | Status: DC
  Filled 2020-01-30: qty 5

## 2020-01-30 MED ORDER — SODIUM CHLORIDE 0.9% FLUSH
10.0000 mL | Freq: Once | INTRAVENOUS | Status: AC
  Administered 2020-01-30: 10 mL via INTRAVENOUS
  Filled 2020-01-30: qty 10

## 2020-01-30 MED ORDER — SODIUM CHLORIDE 0.9 % IV SOLN
2400.0000 mg/m2 | INTRAVENOUS | Status: DC
  Administered 2020-01-30: 4700 mg via INTRAVENOUS
  Filled 2020-01-30: qty 94

## 2020-01-30 MED ORDER — PALONOSETRON HCL INJECTION 0.25 MG/5ML
0.2500 mg | Freq: Once | INTRAVENOUS | Status: AC
  Administered 2020-01-30: 0.25 mg via INTRAVENOUS
  Filled 2020-01-30: qty 5

## 2020-01-30 MED ORDER — SODIUM CHLORIDE 0.9 % IV SOLN: Freq: Every day | INTRAVENOUS | Status: DC | PRN

## 2020-01-30 MED ORDER — SODIUM CHLORIDE 0.9 % IV SOLN
10.0000 mg | Freq: Once | INTRAVENOUS | Status: AC
  Administered 2020-01-30: 10 mg via INTRAVENOUS
  Filled 2020-01-30: qty 10

## 2020-01-30 MED ORDER — SODIUM CHLORIDE 0.9% FLUSH
10.0000 mL | INTRAVENOUS | Status: DC | PRN
  Administered 2020-01-30: 10 mL
  Filled 2020-01-30: qty 10

## 2020-01-30 MED ORDER — SODIUM CHLORIDE 0.9 % IV SOLN
800.0000 mg | Freq: Once | INTRAVENOUS | Status: AC
  Administered 2020-01-30: 800 mg via INTRAVENOUS
  Filled 2020-01-30: qty 40

## 2020-01-30 MED ORDER — POTASSIUM CHLORIDE 20 MEQ/100ML IV SOLN
20.0000 meq | Freq: Once | INTRAVENOUS | Status: AC
  Administered 2020-01-30: 20 meq via INTRAVENOUS

## 2020-01-30 MED ORDER — OXALIPLATIN CHEMO INJECTION 100 MG/20ML
85.0000 mg/m2 | Freq: Once | INTRAVENOUS | Status: AC
  Administered 2020-01-30: 165 mg via INTRAVENOUS
  Filled 2020-01-30: qty 33

## 2020-01-30 MED ORDER — DEXTROSE 5 % IV SOLN
Freq: Once | INTRAVENOUS | Status: AC
  Filled 2020-01-30: qty 250

## 2020-01-30 MED ORDER — SODIUM CHLORIDE 0.9 % IV SOLN
150.0000 mg/m2 | Freq: Once | INTRAVENOUS | Status: AC
  Administered 2020-01-30: 300 mg via INTRAVENOUS
  Filled 2020-01-30: qty 15

## 2020-01-30 MED ORDER — ATROPINE SULFATE 1 MG/ML IJ SOLN
0.5000 mg | Freq: Once | INTRAMUSCULAR | Status: AC
  Administered 2020-01-30: 0.5 mg via INTRAVENOUS
  Filled 2020-01-30: qty 1

## 2020-01-30 NOTE — Progress Notes (Signed)
[place] NOTE  Patient Care Team: [place] as PCP - General [name], [place], [place] (Family Medicine) Rico Junker, RN as Registered Nurse Theodore Demark, RN as Registered Nurse Clent Jacks, RN as Oncology Nurse Navigator  CHIEF COMPLAINTS/PURPOSE OF CONSULTATION: pancreas adenocarcinoma   Oncology History Overview Note  #Pancreas adenocarcinoma-Stage IB- [id number]-[EUS- Dr.Spaete; [facility]/GI; mass 22x62mm; invading/abutting superior mesenteric vein; 2 enlarged lymph nodes peripancreatic/porta hepatis largest 10 x 5.8 mm-nonpathologic based on EUS criteria/no biopsy]-borderline resectable.  PET scan no evidence of distant metastatic disease.  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 Stevan Born September]  # 01/02/2020-FOLFIRINOX; Neulasta.  # Borderline DM; HTN   # SURVIVORSHIP:   # GENETICS:   DIAGNOSIS: Pancreatic cancer  STAGE:  IB/borderline resectable;  GOALS: cure  CURRENT/MOST RECENT THERAPY : Neoadjuvant-FOLFIRINOX    Cancer of head of pancreas ([place])  12/04/2019 Initial Diagnosis   Cancer of head of pancreas ([place])   12/19/2019 - 12/19/2019 Chemotherapy   The patient had PACLitaxel-protein bound (ABRAXANE) chemo infusion 250 mg, 125 mg/m2 = 250 mg, Intravenous,  Once, 1 of 4 cycles Administration: 250 mg (12/19/2019) gemcitabine (GEMZAR) 2,000 mg in sodium chloride 0.9 % 250 mL chemo infusion, 1,976 mg, Intravenous,  Once, 1 of 4 cycles Administration: 2,000 mg (12/19/2019)  for chemotherapy treatment.    01/02/2020 -  Chemotherapy   The patient had dexamethasone (DECADRON) 4 MG tablet, 8 mg, Oral, Daily, 1 of 1 cycle, Start date: --, End date: -- palonosetron (ALOXI) injection 0.25 mg, 0.25 mg, Intravenous,  Once, 3 of 8 cycles Administration: 0.25 mg (01/02/2020), 0.25 mg (01/16/2020),  0.25 mg (01/30/2020) pegfilgrastim-cbqv (UDENYCA) injection 6 mg, 6 mg, Subcutaneous, Once, 3 of 8 cycles Administration: 6 mg (01/04/2020), 6 mg (01/18/2020), 6 mg (02/01/2020) irinotecan (CAMPTOSAR) 300 mg in sodium chloride 0.9 % 500 mL chemo infusion, 150 mg/m2 = 300 mg, Intravenous,  Once, 3 of 8 cycles Administration: 300 mg (01/02/2020), 300 mg (01/16/2020), 300 mg (01/30/2020) oxaliplatin (ELOXATIN) 165 mg in dextrose 5 % 500 mL chemo infusion, 85 mg/m2 = 165 mg, Intravenous,  Once, 3 of 8 cycles Administration: 165 mg (01/02/2020), 165 mg (01/16/2020), 165 mg (01/30/2020) fosaprepitant (EMEND) 150 mg in sodium chloride 0.9 % 145 mL IVPB, 150 mg, Intravenous,  Once, 3 of 8 cycles Administration: 150 mg (01/02/2020), 150 mg (01/16/2020), 150 mg (01/30/2020) fluorouracil (ADRUCIL) 4,700 mg in sodium chloride 0.9 % 56 mL chemo infusion, 2,400 mg/m2 = 4,700 mg, Intravenous, 1 Day/Dose, 3 of 8 cycles Administration: 4,700 mg (01/02/2020), 4,700 mg (01/16/2020), 4,700 mg (01/30/2020) leucovorin 800 mg in sodium chloride 0.9 % 250 mL infusion, 784 mg, Intravenous,  Once, 3 of 8 cycles Administration: 800 mg (01/02/2020), 800 mg (01/16/2020), 800 mg (01/30/2020)  for chemotherapy treatment.       HISTORY OF PRESENTING ILLNESS:  Robin Daniels 64 y.o.  female borderline resectable pancreatic adeno ca currently on neoadjuvant chemotherapy with FOLFIRINOX.  Patient received cycle #2 chemotherapy 2 week ago.  Patient had infusion reaction while getting irinotecan.  Patient had flushing; elevated heart rate; also felt legs heavy/difficulty moving.  Symptoms improved after giving atropine.   Review of Systems  Constitutional: Positive for weight loss. Negative for chills, diaphoresis, fever and malaise/fatigue.  HENT: Negative for nosebleeds and sore throat.   Eyes: Negative for double vision.  Respiratory: Negative for cough, hemoptysis, sputum production, shortness  of breath and wheezing.   Cardiovascular:  Negative for chest pain, palpitations, orthopnea and leg swelling.  Gastrointestinal: Negative for abdominal pain, blood in stool, constipation, diarrhea, heartburn, melena, nausea and vomiting.  Genitourinary: Negative for dysuria, frequency and urgency.  Musculoskeletal: Negative for back pain and joint pain.  Skin: Negative.  Negative for itching and rash.  Neurological: Negative for dizziness, tingling, focal weakness, weakness and headaches.  Endo/Heme/Allergies: Does not bruise/bleed easily.  Psychiatric/Behavioral: Negative for depression. The patient is not nervous/anxious and does not have insomnia.      MEDICAL HISTORY:  Past Medical History:  Diagnosis Date  . Anemia    history of  . Anxiety 09/11/2014  . Arthritis   . Cancer of head of pancreas ([place]) 12/04/2019  . Diabetes mellitus without complication ([place])   . Fluttering heart   . Hyperlipidemia   . Hypertension     SURGICAL HISTORY: Past Surgical History:  Procedure Laterality Date  . ENDOSCOPIC RETROGRADE CHOLANGIOPANCREATOGRAPHY (ERCP) WITH PROPOFOL N/A 11/16/2019   Procedure: ENDOSCOPIC RETROGRADE CHOLANGIOPANCREATOGRAPHY (ERCP) WITH PROPOFOL;  Surgeon: Lucilla Lame, MD;  Location: [facility] ENDOSCOPY;  Service: Endoscopy;  Laterality: N/A;  . EUS N/A 11/29/2019   Procedure: FULL UPPER ENDOSCOPIC ULTRASOUND (EUS) RADIAL;  Surgeon: Reita Cliche, MD;  Location: [facility] ENDOSCOPY;  Service: Gastroenterology;  Laterality: N/A;  . IR IMAGING GUIDED PORT INSERTION  12/14/2019  . right knee replacement Right [id number]  . SHOULDER ARTHROSCOPY W/ ROTATOR CUFF REPAIR Right     SOCIAL HISTORY: Social History   Socioeconomic History  . Marital status: Divorced    Spouse name: Not on file  . Number of children: Not on file  . Years of education: Not on file  . Highest education level: Not on file  Occupational History  . Not on file  Tobacco Use  . Smoking status: Never Smoker  . Smokeless tobacco: Never Used  Vaping Use   . Vaping Use: Never used  Substance and Sexual Activity  . Alcohol use: No    Alcohol/week: 0.0 standard drinks  . Drug use: No  . Sexual activity: Never  Other Topics Concern  . Not on file  Social History Narrative   Lives in [place]; with mom and son; worked in dietary; never smoked; no alcohol.    Social Determinants of Health   Financial Resource Strain:   . Difficulty of Paying Living Expenses: Not on file  Food Insecurity:   . Worried About Charity fundraiser in the Last Year: Not on file  . Ran Out of Food in the Last Year: Not on file  Transportation Needs:   . Lack of Transportation (Medical): Not on file  . Lack of Transportation (Non-Medical): Not on file  Physical Activity:   . Days of Exercise per Week: Not on file  . Minutes of Exercise per Session: Not on file  Stress:   . Feeling of Stress : Not on file  Social Connections:   . Frequency of Communication with Friends and Family: Not on file  . Frequency of Social Gatherings with Friends and Family: Not on file  . Attends Religious Services: Not on file  . Active Member of Clubs or Organizations: Not on file  . Attends Archivist Meetings: Not on file  . Marital Status: Not on file  Intimate Partner Violence:   . Fear of Current or Ex-Partner: Not on file  . Emotionally Abused: Not on file  . Physically Abused: Not on file  . Sexually  Abused: Not on file    FAMILY HISTORY: Family History  Problem Relation Age of Onset  . Diabetes Mother   . Hyperlipidemia Mother   . Hypertension Mother   . Vision loss Mother   . Arthritis Father   . Hyperlipidemia Father   . Hypertension Father   . Breast cancer Maternal Aunt     ALLERGIES:  has No Known Allergies.  MEDICATIONS:  Current Outpatient Medications  Medication Sig Dispense Refill  . acetaminophen (TYLENOL) 650 MG CR tablet Take 1 tablet by mouth every 8 (eight) hours as needed.    Marland Kitchen amLODipine (NORVASC) 10 MG tablet Take 10 mg by  mouth daily.     . diphenoxylate-atropine (LOMOTIL) 2.5-0.025 MG tablet Take 1 tablet by mouth 4 (four) times daily as needed for diarrhea or loose stools. Take it along with immodium 45 tablet 0  . metoprolol succinate (TOPROL-XL) 50 MG 24 hr tablet Take 50 mg by mouth daily. Take with or immediately following a meal.     . Potassium Chloride ER 20 MEQ TBCR Take 20 mEq by mouth in the morning and at bedtime. 60 tablet 1  . vitamin B-12 (CYANOCOBALAMIN) 1000 MCG tablet Take 1 tablet (1,000 mcg total) by mouth daily.     No current facility-administered medications for this visit.      Marland Kitchen  PHYSICAL EXAMINATION: ECOG PERFORMANCE STATUS: 1 - Symptomatic but completely ambulatory  Vitals:   01/30/20 0928  BP: (!) 184/97  Pulse: 96  Resp: 18  Temp: (!) 97 F (36.1 C)  SpO2: 99%   Filed Weights   01/30/20 0928  Weight: 192 lb (87.1 kg)    Physical Exam HENT:     Head: Normocephalic and atraumatic.     Mouth/Throat:     Pharynx: No oropharyngeal exudate.  Eyes:     Pupils: Pupils are equal, round, and reactive to light.  Cardiovascular:     Rate and Rhythm: Normal rate and regular rhythm.  Pulmonary:     Effort: Pulmonary effort is normal. No respiratory distress.     Breath sounds: Normal breath sounds. No wheezing.  Abdominal:     General: Bowel sounds are normal. There is no distension.     Palpations: Abdomen is soft. There is no mass.     Tenderness: There is no abdominal tenderness. There is no guarding or rebound.  Musculoskeletal:        General: No tenderness. Normal range of motion.     Cervical back: Normal range of motion and neck supple.  Skin:    General: Skin is warm.  Neurological:     Mental Status: She is alert and oriented to person, place, and time.  Psychiatric:        Mood and Affect: Affect normal.      LABORATORY DATA:  I have reviewed the data as listed Lab Results  Component Value Date   WBC 10.1 01/30/2020   HGB 9.7 (L) 01/30/2020    HCT 30.1 (L) 01/30/2020   MCV 73.1 (L) 01/30/2020   PLT 270 01/30/2020   Recent Labs    11/21/19 1449 11/21/19 1449 12/04/19 1327 12/04/19 1327 12/19/19 0833 01/02/20 0831 01/10/20 1057 01/10/20 1057 01/16/20 0834 01/23/20 1105 01/30/20 0854  NA 133*   < > 136   < > 139   < > 135   < > 137 135 138  K 4.2   < > 3.6   < > 3.2*   < > 3.4*   < >  3.1* 4.0 2.9*  CL 91*   < > 102   < > 102   < > 101   < > 102 101 101  CO2 30   < > 26   < > 26   < > 25   < > 26 26 24   GLUCOSE 169*   < > 126*   < > 168*   < > 134*   < > 128* 118* 116*  BUN 15   < > 7*   < > 8   < > 7*   < > 6* 10 6*  CREATININE 1.02*   < > 0.90   < > 0.89   < > 0.77   < > 0.90 0.69 0.85  CALCIUM 9.0   < > 8.7*   < > 8.9   < > 8.6*   < > 8.6* 8.3* 8.9  GFRNONAA 57*   < > >60   < > >60   < > >60   < > >60 >60 >60  GFRAA >60  --  >60  --  >60  --   --   --   --   --   --   PROT 8.3*   < > 7.3   < > 7.6   < > 7.2  --  7.2  --  7.5  ALBUMIN 3.4*   < > 3.5   < > 3.5   < > 3.5  --  3.5  --  3.6  AST 45*   < > 19   < > 35   < > 20  --  21  --  26  ALT 137*   < > 22   < > 50*   < > 18  --  19  --  23  ALKPHOS 405*   < > 156*   < > 294*   < > 118  --  109  --  107  BILITOT 1.8*   < > 0.9   < > 0.7   < > 0.5  --  0.3  --  0.3   < > = values in this interval not displayed.    RADIOGRAPHIC STUDIES: I have personally reviewed the radiological images as listed and agreed with the findings in the report. No results found.  ASSESSMENT & PLAN:   Cancer of head of pancreas ([place]) #  Pancreas adenocarcinoma-Stage IB-/boderline resectable ;Given the abutment of the SMV;  neoadjuvant chemotherapy- on FOLFIRINX.  # proceed with cycle #3 Continue FOLFIRINOX. Labs today reviewed;  acceptable for treatment today.  We will plan to get a CT scan pancreas protocol after 4-6 cycles.  CA 19-9 improving.  # Severe Hypokalemia- 2.9; on Kur 20 daily; increase to 20 BID.;new scipt given; also had potassium IV.  # Infusion reaction- ? Sec to  irinotecan; plan pre-and post atropine.   # Biliary obstruction status post ERCP and stenting-secondary to above malignancy-LFt- WNL.   # DISPOSITION: # chemo as planned. ADD KCL 20 meq today.  D-3 pump off; add undeyca  # 1 week- labs- cbc/bmp  # 2 weeks-MD; labs- cbc.cmp;ca-19-9; possible KCL over 1 hour; FOLFIRINOX; d-3 pump-OFF;udenyca Dr.B   All questions were answered. The patient knows to call the clinic with any problems, questions or concerns.    Cammie Sickle, MD 02/04/2020 4:01 PM

## 2020-01-30 NOTE — Assessment & Plan Note (Addendum)
#    Pancreas adenocarcinoma-Stage IB-/boderline resectable ;Given the abutment of the SMV;  neoadjuvant chemotherapy- on FOLFIRINX.  # proceed with cycle #3 Continue FOLFIRINOX. Labs today reviewed;  acceptable for treatment today.  We will plan to get a CT scan pancreas protocol after 4-6 cycles.  CA 19-9 improving.  # Severe Hypokalemia- 2.9; on Kur 20 daily; increase to 20 BID.;new scipt given; also had potassium IV.  # Infusion reaction- ? Sec to irinotecan; plan pre-and post atropine.   # Biliary obstruction status post ERCP and stenting-secondary to above malignancy-LFt- WNL.   # DISPOSITION: # chemo as planned. ADD KCL 20 meq today.  D-3 pump off; add undeyca  # 1 week- labs- cbc/bmp  # 2 weeks-MD; labs- cbc.cmp;ca-19-9; possible KCL over 1 hour; FOLFIRINOX; d-3 pump-OFF;udenyca Dr.B

## 2020-01-30 NOTE — Progress Notes (Signed)
Blood Pressure: 180/90. MD, Dr. Rogue Bussing, notified and aware. Per MD order: proceed with scheduled treatment today.  1555- Patient states, "My legs feel heavy right now." Patient reports experiencing this symptom the last two times she received Irinotecan infusion and says it is not quite as bad this time. Blood Pressure: 170/91 right now. On call MD, Dr. Tasia Catchings, notified and aware. Per MD order: continue with Irinotecan infusion and no new orders at this time; continue to monitor patient for any further symptoms.  1725- Patient and vital signs stable. Patient discharged to home with Fluorouracil Pump in place at this time.

## 2020-01-31 LAB — CANCER ANTIGEN 19-9: CA 19-9: 32 U/mL (ref 0–35)

## 2020-02-01 ENCOUNTER — Other Ambulatory Visit: Payer: Self-pay

## 2020-02-01 ENCOUNTER — Inpatient Hospital Stay: Payer: Medicare Other

## 2020-02-01 VITALS — BP 160/90 | HR 90 | Temp 97.1°F | Resp 18

## 2020-02-01 DIAGNOSIS — Z5111 Encounter for antineoplastic chemotherapy: Secondary | ICD-10-CM | POA: Diagnosis not present

## 2020-02-01 DIAGNOSIS — C250 Malignant neoplasm of head of pancreas: Secondary | ICD-10-CM

## 2020-02-01 MED ORDER — SODIUM CHLORIDE 0.9% FLUSH
10.0000 mL | INTRAVENOUS | Status: DC | PRN
  Administered 2020-02-01: 10 mL
  Filled 2020-02-01: qty 10

## 2020-02-01 MED ORDER — HEPARIN SOD (PORK) LOCK FLUSH 100 UNIT/ML IV SOLN
500.0000 [IU] | Freq: Once | INTRAVENOUS | Status: AC | PRN
  Administered 2020-02-01: 500 [IU]
  Filled 2020-02-01: qty 5

## 2020-02-01 MED ORDER — PEGFILGRASTIM-CBQV 6 MG/0.6ML ~~LOC~~ SOSY
6.0000 mg | PREFILLED_SYRINGE | Freq: Once | SUBCUTANEOUS | Status: AC
  Administered 2020-02-01: 6 mg via SUBCUTANEOUS

## 2020-02-01 MED ORDER — HEPARIN SOD (PORK) LOCK FLUSH 100 UNIT/ML IV SOLN
INTRAVENOUS | Status: AC
  Filled 2020-02-01: qty 5

## 2020-02-04 ENCOUNTER — Inpatient Hospital Stay: Payer: Medicare Other

## 2020-02-06 ENCOUNTER — Inpatient Hospital Stay: Payer: Medicare Other

## 2020-02-06 ENCOUNTER — Other Ambulatory Visit: Payer: Self-pay

## 2020-02-06 DIAGNOSIS — Z5111 Encounter for antineoplastic chemotherapy: Secondary | ICD-10-CM | POA: Diagnosis not present

## 2020-02-06 DIAGNOSIS — C250 Malignant neoplasm of head of pancreas: Secondary | ICD-10-CM

## 2020-02-06 LAB — BASIC METABOLIC PANEL
Anion gap: 9 (ref 5–15)
BUN: 7 mg/dL — ABNORMAL LOW (ref 8–23)
CO2: 26 mmol/L (ref 22–32)
Calcium: 8.5 mg/dL — ABNORMAL LOW (ref 8.9–10.3)
Chloride: 100 mmol/L (ref 98–111)
Creatinine, Ser: 0.81 mg/dL (ref 0.44–1.00)
GFR, Estimated: >60 mL/min (ref >60)
Glucose, Bld: 115 mg/dL — ABNORMAL HIGH (ref 70–99)
Potassium: 3.9 mmol/L (ref 3.5–5.1)
Sodium: 135 mmol/L (ref 135–145)

## 2020-02-06 LAB — CBC WITH DIFFERENTIAL/PLATELET
Abs Immature Granulocytes: 0.1 10*3/uL — ABNORMAL HIGH (ref 0.00–0.07)
Basophils Absolute: 0.1 10*3/uL (ref 0.0–0.1)
Basophils Relative: 1 %
Eosinophils Absolute: 0.3 10*3/uL (ref 0.0–0.5)
Eosinophils Relative: 4 %
HCT: 29.5 % — ABNORMAL LOW (ref 36.0–46.0)
Hemoglobin: 9.2 g/dL — ABNORMAL LOW (ref 12.0–15.0)
Lymphocytes Relative: 23 %
Lymphs Abs: 1.5 10*3/uL (ref 0.7–4.0)
MCH: 23.4 pg — ABNORMAL LOW (ref 26.0–34.0)
MCHC: 31.2 g/dL (ref 30.0–36.0)
MCV: 74.9 fL — ABNORMAL LOW (ref 80.0–100.0)
Metamyelocytes Relative: 1 %
Monocytes Absolute: 0.5 10*3/uL (ref 0.1–1.0)
Monocytes Relative: 8 %
Neutro Abs: 4.2 10*3/uL (ref 1.7–7.7)
Neutrophils Relative %: 63 %
Platelets: 188 10*3/uL (ref 150–400)
RBC: 3.94 MIL/uL (ref 3.87–5.11)
RDW: 18.2 % — ABNORMAL HIGH (ref 11.5–15.5)
Smear Review: NORMAL
WBC: 6.7 10*3/uL (ref 4.0–10.5)
nRBC: 0 % (ref 0.0–0.2)

## 2020-02-13 ENCOUNTER — Inpatient Hospital Stay: Payer: Medicare Other

## 2020-02-13 ENCOUNTER — Ambulatory Visit
Admission: RE | Admit: 2020-02-13 | Discharge: 2020-02-13 | Disposition: A | Discharge status: Home / Self Care | Payer: Medicare Other | Source: Ambulatory Visit | Attending: Internal Medicine | Admitting: Internal Medicine

## 2020-02-13 ENCOUNTER — Encounter: Payer: Self-pay | Admitting: Internal Medicine

## 2020-02-13 ENCOUNTER — Other Ambulatory Visit: Payer: Self-pay

## 2020-02-13 ENCOUNTER — Inpatient Hospital Stay (HOSPITAL_BASED_OUTPATIENT_CLINIC_OR_DEPARTMENT_OTHER): Payer: Medicare Other | Admitting: Internal Medicine

## 2020-02-13 VITALS — BP 169/88 | HR 87 | Temp 97.4°F | Resp 16 | Ht 63.0 in | Wt 191.0 lb

## 2020-02-13 DIAGNOSIS — C250 Malignant neoplasm of head of pancreas: Secondary | ICD-10-CM | POA: Diagnosis not present

## 2020-02-13 DIAGNOSIS — Z5111 Encounter for antineoplastic chemotherapy: Secondary | ICD-10-CM | POA: Diagnosis not present

## 2020-02-13 DIAGNOSIS — Z452 Encounter for adjustment and management of vascular access device: Secondary | ICD-10-CM | POA: Diagnosis not present

## 2020-02-13 DIAGNOSIS — Z95828 Presence of other vascular implants and grafts: Secondary | ICD-10-CM

## 2020-02-13 LAB — COMPREHENSIVE METABOLIC PANEL
ALT: 22 U/L (ref 0–44)
AST: 26 U/L (ref 15–41)
Albumin: 3.6 g/dL (ref 3.5–5.0)
Alkaline Phosphatase: 111 U/L (ref 38–126)
Anion gap: 11 (ref 5–15)
BUN: 8 mg/dL (ref 8–23)
CO2: 24 mmol/L (ref 22–32)
Calcium: 9.2 mg/dL (ref 8.9–10.3)
Chloride: 102 mmol/L (ref 98–111)
Creatinine, Ser: 0.96 mg/dL (ref 0.44–1.00)
GFR, Estimated: >60 mL/min (ref >60)
Glucose, Bld: 116 mg/dL — ABNORMAL HIGH (ref 70–99)
Potassium: 3.2 mmol/L — ABNORMAL LOW (ref 3.5–5.1)
Sodium: 137 mmol/L (ref 135–145)
Total Bilirubin: 0.4 mg/dL (ref 0.3–1.2)
Total Protein: 7.5 g/dL (ref 6.5–8.1)

## 2020-02-13 LAB — CBC WITH DIFFERENTIAL/PLATELET
Abs Immature Granulocytes: 0.57 10*3/uL — ABNORMAL HIGH (ref 0.00–0.07)
Basophils Absolute: 0.1 10*3/uL (ref 0.0–0.1)
Basophils Relative: 1 %
Eosinophils Absolute: 0.2 10*3/uL (ref 0.0–0.5)
Eosinophils Relative: 2 %
HCT: 31.6 % — ABNORMAL LOW (ref 36.0–46.0)
Hemoglobin: 10.2 g/dL — ABNORMAL LOW (ref 12.0–15.0)
Immature Granulocytes: 7 %
Lymphocytes Relative: 28 %
Lymphs Abs: 2.3 10*3/uL (ref 0.7–4.0)
MCH: 24.1 pg — ABNORMAL LOW (ref 26.0–34.0)
MCHC: 32.3 g/dL (ref 30.0–36.0)
MCV: 74.7 fL — ABNORMAL LOW (ref 80.0–100.0)
Monocytes Absolute: 0.7 10*3/uL (ref 0.1–1.0)
Monocytes Relative: 8 %
Neutro Abs: 4.3 10*3/uL (ref 1.7–7.7)
Neutrophils Relative %: 54 %
Platelets: 267 10*3/uL (ref 150–400)
RBC: 4.23 MIL/uL (ref 3.87–5.11)
RDW: 19 % — ABNORMAL HIGH (ref 11.5–15.5)
WBC: 8.1 10*3/uL (ref 4.0–10.5)
nRBC: 0.5 % — ABNORMAL HIGH (ref 0.0–0.2)

## 2020-02-13 MED ORDER — HEPARIN SOD (PORK) LOCK FLUSH 100 UNIT/ML IV SOLN
INTRAVENOUS | Status: AC
  Filled 2020-02-13: qty 5

## 2020-02-13 MED ORDER — HEPARIN SOD (PORK) LOCK FLUSH 100 UNIT/ML IV SOLN
500.0000 [IU] | Freq: Once | INTRAVENOUS | Status: AC
  Administered 2020-02-13: 500 [IU] via INTRAVENOUS
  Filled 2020-02-13: qty 5

## 2020-02-13 MED ORDER — SODIUM CHLORIDE 0.9% FLUSH: 10.0000 mL | INTRAVENOUS | Status: DC | PRN

## 2020-02-13 MED ORDER — ALTEPLASE 2 MG IJ SOLR
2.0000 mg | Freq: Once | INTRAMUSCULAR | Status: AC
  Administered 2020-02-13: 2 mg
  Filled 2020-02-13: qty 2

## 2020-02-13 MED ORDER — CHLORHEXIDINE GLUCONATE CLOTH 2 % EX PADS: 6.0000 | MEDICATED_PAD | Freq: Every day | CUTANEOUS | Status: DC

## 2020-02-13 MED ORDER — SODIUM CHLORIDE 0.9% FLUSH
10.0000 mL | INTRAVENOUS | Status: DC | PRN
  Administered 2020-02-13: 10 mL via INTRAVENOUS
  Filled 2020-02-13: qty 10

## 2020-02-13 MED ORDER — SODIUM CHLORIDE 0.9% FLUSH: 10.0000 mL | Freq: Two times a day (BID) | INTRAVENOUS | Status: DC

## 2020-02-13 MED ORDER — ALTEPLASE 2 MG IJ SOLR
2.0000 mg | Freq: Once | INTRAMUSCULAR | Status: DC
  Filled 2020-02-13: qty 2

## 2020-02-13 NOTE — Progress Notes (Signed)
Made mention that legs feel heavy and jaw joints hurt after chemo. Does go away after a while.

## 2020-02-13 NOTE — Progress Notes (Signed)
[place] NOTE  Patient Care Team: [place] as PCP - General [name], [place], [place] (Family Medicine) Rico Junker, RN as Registered Nurse Theodore Demark, RN as Registered Nurse Clent Jacks, RN as Oncology Nurse Navigator  CHIEF COMPLAINTS/PURPOSE OF CONSULTATION: pancreas adenocarcinoma   Oncology History Overview Note  #Pancreas adenocarcinoma-Stage IB- [id number]-[EUS- Dr.Spaete; [facility]/GI; mass 22x18mm; invading/abutting superior mesenteric vein; 2 enlarged lymph nodes peripancreatic/porta hepatis largest 10 x 5.8 mm-nonpathologic based on EUS criteria/no biopsy]-borderline resectable.  PET scan no evidence of distant metastatic disease.  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 Stevan Born September]  # 01/02/2020-FOLFIRINOX; Neulasta.  # Borderline DM; HTN   # SURVIVORSHIP:   # GENETICS:   DIAGNOSIS: Pancreatic cancer  STAGE:  IB/borderline resectable;  GOALS: cure  CURRENT/MOST RECENT THERAPY : Neoadjuvant-FOLFIRINOX    Cancer of head of pancreas ([place])  12/04/2019 Initial Diagnosis   Cancer of head of pancreas ([place])   12/19/2019 - 12/19/2019 Chemotherapy   The patient had PACLitaxel-protein bound (ABRAXANE) chemo infusion 250 mg, 125 mg/m2 = 250 mg, Intravenous,  Once, 1 of 4 cycles Administration: 250 mg (12/19/2019) gemcitabine (GEMZAR) 2,000 mg in sodium chloride 0.9 % 250 mL chemo infusion, 1,976 mg, Intravenous,  Once, 1 of 4 cycles Administration: 2,000 mg (12/19/2019)  for chemotherapy treatment.    01/02/2020 -  Chemotherapy   The patient had dexamethasone (DECADRON) 4 MG tablet, 8 mg, Oral, Daily, 1 of 1 cycle, Start date: --, End date: -- palonosetron (ALOXI) injection 0.25 mg, 0.25 mg, Intravenous,  Once, 3 of 8 cycles Administration: 0.25 mg (01/02/2020), 0.25 mg (01/16/2020),  0.25 mg (01/30/2020) pegfilgrastim-cbqv (UDENYCA) injection 6 mg, 6 mg, Subcutaneous, Once, 3 of 8 cycles Administration: 6 mg (01/04/2020), 6 mg (01/18/2020), 6 mg (02/01/2020) irinotecan (CAMPTOSAR) 300 mg in sodium chloride 0.9 % 500 mL chemo infusion, 150 mg/m2 = 300 mg, Intravenous,  Once, 3 of 8 cycles Administration: 300 mg (01/02/2020), 300 mg (01/16/2020), 300 mg (01/30/2020) oxaliplatin (ELOXATIN) 165 mg in dextrose 5 % 500 mL chemo infusion, 85 mg/m2 = 165 mg, Intravenous,  Once, 3 of 8 cycles Administration: 165 mg (01/02/2020), 165 mg (01/16/2020), 165 mg (01/30/2020) fosaprepitant (EMEND) 150 mg in sodium chloride 0.9 % 145 mL IVPB, 150 mg, Intravenous,  Once, 3 of 8 cycles Administration: 150 mg (01/02/2020), 150 mg (01/16/2020), 150 mg (01/30/2020) fluorouracil (ADRUCIL) 4,700 mg in sodium chloride 0.9 % 56 mL chemo infusion, 2,400 mg/m2 = 4,700 mg, Intravenous, 1 Day/Dose, 3 of 8 cycles Administration: 4,700 mg (01/02/2020), 4,700 mg (01/16/2020), 4,700 mg (01/30/2020) leucovorin 800 mg in sodium chloride 0.9 % 250 mL infusion, 784 mg, Intravenous,  Once, 3 of 8 cycles Administration: 800 mg (01/02/2020), 800 mg (01/16/2020), 800 mg (01/30/2020)  for chemotherapy treatment.       HISTORY OF PRESENTING ILLNESS:  Robin Daniels 64 y.o.  female borderline resectable pancreatic adeno ca currently on neoadjuvant chemotherapy with FOLFIRINOX.  Patient is currently status post 3 cycles of chemotherapy.  Patient again will getting irinotecan noted to have heaviness of her legs.  And also cramping of her jaw.  Symptoms improved after 4 to 6 hours.  Resolved over the next day.  Otherwise no nausea no vomiting no diarrhea.  No fevers or chills.   Review of Systems  Constitutional: Positive for weight loss. Negative for chills, diaphoresis, fever and malaise/fatigue.  HENT:  Negative for nosebleeds and sore throat.   Eyes: Negative for double vision.  Respiratory: Negative for cough, hemoptysis,  sputum production, shortness of breath and wheezing.   Cardiovascular: Negative for chest pain, palpitations, orthopnea and leg swelling.  Gastrointestinal: Negative for abdominal pain, blood in stool, constipation, diarrhea, heartburn, melena, nausea and vomiting.  Genitourinary: Negative for dysuria, frequency and urgency.  Musculoskeletal: Negative for back pain and joint pain.  Skin: Negative.  Negative for itching and rash.  Neurological: Negative for dizziness, tingling, focal weakness, weakness and headaches.  Endo/Heme/Allergies: Does not bruise/bleed easily.  Psychiatric/Behavioral: Negative for depression. The patient is not nervous/anxious and does not have insomnia.      MEDICAL HISTORY:  Past Medical History:  Diagnosis Date  . Anemia    history of  . Anxiety 09/11/2014  . Arthritis   . Cancer of head of pancreas ([place]) 12/04/2019  . Diabetes mellitus without complication ([place])   . Fluttering heart   . Hyperlipidemia   . Hypertension     SURGICAL HISTORY: Past Surgical History:  Procedure Laterality Date  . ENDOSCOPIC RETROGRADE CHOLANGIOPANCREATOGRAPHY (ERCP) WITH PROPOFOL N/A 11/16/2019   Procedure: ENDOSCOPIC RETROGRADE CHOLANGIOPANCREATOGRAPHY (ERCP) WITH PROPOFOL;  Surgeon: Lucilla Lame, MD;  Location: [facility] ENDOSCOPY;  Service: Endoscopy;  Laterality: N/A;  . EUS N/A 11/29/2019   Procedure: FULL UPPER ENDOSCOPIC ULTRASOUND (EUS) RADIAL;  Surgeon: Reita Cliche, MD;  Location: [facility] ENDOSCOPY;  Service: Gastroenterology;  Laterality: N/A;  . IR IMAGING GUIDED PORT INSERTION  12/14/2019  . right knee replacement Right [id number]  . SHOULDER ARTHROSCOPY W/ ROTATOR CUFF REPAIR Right     SOCIAL HISTORY: Social History   Socioeconomic History  . Marital status: Divorced    Spouse name: Not on file  . Number of children: Not on file  . Years of education: Not on file  . Highest education level: Not on file  Occupational History  . Not on file  Tobacco Use  .  Smoking status: Never Smoker  . Smokeless tobacco: Never Used  Vaping Use  . Vaping Use: Never used  Substance and Sexual Activity  . Alcohol use: No    Alcohol/week: 0.0 standard drinks  . Drug use: No  . Sexual activity: Never  Other Topics Concern  . Not on file  Social History Narrative   Lives in [place]; with mom and son; worked in dietary; never smoked; no alcohol.    Social Determinants of Health   Financial Resource Strain:   . Difficulty of Paying Living Expenses: Not on file  Food Insecurity:   . Worried About Charity fundraiser in the Last Year: Not on file  . Ran Out of Food in the Last Year: Not on file  Transportation Needs:   . Lack of Transportation (Medical): Not on file  . Lack of Transportation (Non-Medical): Not on file  Physical Activity:   . Days of Exercise per Week: Not on file  . Minutes of Exercise per Session: Not on file  Stress:   . Feeling of Stress : Not on file  Social Connections:   . Frequency of Communication with Friends and Family: Not on file  . Frequency of Social Gatherings with Friends and Family: Not on file  . Attends Religious Services: Not on file  . Active Member of Clubs or Organizations: Not on file  . Attends Archivist Meetings: Not on file  . Marital Status: Not on file  Intimate Partner Violence:   . Fear of Current  or Ex-Partner: Not on file  . Emotionally Abused: Not on file  . Physically Abused: Not on file  . Sexually Abused: Not on file    FAMILY HISTORY: Family History  Problem Relation Age of Onset  . Diabetes Mother   . Hyperlipidemia Mother   . Hypertension Mother   . Vision loss Mother   . Arthritis Father   . Hyperlipidemia Father   . Hypertension Father   . Breast cancer Maternal Aunt     ALLERGIES:  has No Known Allergies.  MEDICATIONS:  Current Outpatient Medications  Medication Sig Dispense Refill  . acetaminophen (TYLENOL) 650 MG CR tablet Take 1 tablet by mouth every 8  (eight) hours as needed.    Marland Kitchen amLODipine (NORVASC) 10 MG tablet Take 10 mg by mouth daily.     . diphenoxylate-atropine (LOMOTIL) 2.5-0.025 MG tablet Take 1 tablet by mouth 4 (four) times daily as needed for diarrhea or loose stools. Take it along with immodium 45 tablet 0  . metoprolol succinate (TOPROL-XL) 50 MG 24 hr tablet Take 50 mg by mouth daily. Take with or immediately following a meal.     . Potassium Chloride ER 20 MEQ TBCR Take 20 mEq by mouth in the morning and at bedtime. 60 tablet 1  . vitamin B-12 (CYANOCOBALAMIN) 1000 MCG tablet Take 1 tablet (1,000 mcg total) by mouth daily.     No current facility-administered medications for this visit.   Facility-Administered Medications Ordered in Other Visits  Medication Dose Route Frequency Provider Last Rate Last Admin  . alteplase (CATHFLO ACTIVASE) injection 2 mg  2 mg Intracatheter Once Suttle, Rosanne Ashing, MD      . Chlorhexidine Gluconate Cloth 2 % PADS 6 each  6 each Topical Daily Suttle, Dylan J, MD      . heparin lock flush 100 UNIT/ML injection           . sodium chloride flush (NS) 0.9 % injection 10-40 mL  10-40 mL Intracatheter Q12H Suttle, Dylan J, MD      . sodium chloride flush (NS) 0.9 % injection 10-40 mL  10-40 mL Intracatheter PRN Suttle, Rosanne Ashing, MD          .  PHYSICAL EXAMINATION: ECOG PERFORMANCE STATUS: 1 - Symptomatic but completely ambulatory  Vitals:   02/13/20 0905  BP: (!) 169/88  Pulse: 87  Resp: 16  Temp: (!) 97.4 F (36.3 C)  SpO2: 100%   Filed Weights   02/13/20 0905  Weight: 191 lb (86.6 kg)    Physical Exam HENT:     Head: Normocephalic and atraumatic.     Mouth/Throat:     Pharynx: No oropharyngeal exudate.  Eyes:     Pupils: Pupils are equal, round, and reactive to light.  Cardiovascular:     Rate and Rhythm: Normal rate and regular rhythm.  Pulmonary:     Effort: Pulmonary effort is normal. No respiratory distress.     Breath sounds: Normal breath sounds. No wheezing.   Abdominal:     General: Bowel sounds are normal. There is no distension.     Palpations: Abdomen is soft. There is no mass.     Tenderness: There is no abdominal tenderness. There is no guarding or rebound.  Musculoskeletal:        General: No tenderness. Normal range of motion.     Cervical back: Normal range of motion and neck supple.  Skin:    General: Skin is warm.  Neurological:  Mental Status: She is alert and oriented to person, place, and time.  Psychiatric:        Mood and Affect: Affect normal.      LABORATORY DATA:  I have reviewed the data as listed Lab Results  Component Value Date   WBC 8.1 02/13/2020   HGB 10.2 (L) 02/13/2020   HCT 31.6 (L) 02/13/2020   MCV 74.7 (L) 02/13/2020   PLT 267 02/13/2020   Recent Labs    11/21/19 1449 11/21/19 1449 12/04/19 1327 12/04/19 1327 12/19/19 0833 01/02/20 0831 01/16/20 0834 01/23/20 1105 01/30/20 0854 02/06/20 1022 02/13/20 0842  NA 133*   < > 136   < > 139   < > 137   < > 138 135 137  K 4.2   < > 3.6   < > 3.2*   < > 3.1*   < > 2.9* 3.9 3.2*  CL 91*   < > 102   < > 102   < > 102   < > 101 100 102  CO2 30   < > 26   < > 26   < > 26   < > 24 26 24   GLUCOSE 169*   < > 126*   < > 168*   < > 128*   < > 116* 115* 116*  BUN 15   < > 7*   < > 8   < > 6*   < > 6* 7* 8  CREATININE 1.02*   < > 0.90   < > 0.89   < > 0.90   < > 0.85 0.81 0.96  CALCIUM 9.0   < > 8.7*   < > 8.9   < > 8.6*   < > 8.9 8.5* 9.2  GFRNONAA 57*   < > >60   < > >60   < > >60   < > >60 >60 >60  GFRAA >60  --  >60  --  >60  --   --   --   --   --   --   PROT 8.3*   < > 7.3   < > 7.6   < > 7.2  --  7.5  --  7.5  ALBUMIN 3.4*   < > 3.5   < > 3.5   < > 3.5  --  3.6  --  3.6  AST 45*   < > 19   < > 35   < > 21  --  26  --  26  ALT 137*   < > 22   < > 50*   < > 19  --  23  --  22  ALKPHOS 405*   < > 156*   < > 294*   < > 109  --  107  --  111  BILITOT 1.8*   < > 0.9   < > 0.7   < > 0.3  --  0.3  --  0.4   < > = values in this interval not displayed.     RADIOGRAPHIC STUDIES: I have personally reviewed the radiological images as listed and agreed with the findings in the report. DG Fluoro Guide CV Line Right  Result Date: 02/13/2020 INDICATION: 64 year old female with pancreatic cancer and indwelling right IJ single-lumen port placed 12/14/2019 presenting with difficulty with aspiration from port. EXAM: FLUOROSCOPIC GUIDED PORT A CATHETER CHECK MEDICATIONS: None. CONTRAST:  5 mL Omnipaque 300 FLUOROSCOPY TIME:  0 seconds (73  mGy) COMPLICATIONS: None immediate. TECHNIQUE: The procedure, risks, benefits, and alternatives were explained to the patient and informed written consent was obtained. A timeout was performed prior to the initiation of the procedure. The patient's chest port a catheter was accessed by the IV team. The patient was placed supine on the fluoroscopy table. A preprocedural spot fluoroscopic image was obtained of the chest in existing port a catheter. Note was made of difficulty aspirating blood from the port a catheter. Contrast was injected via the Port a catheter and images were reviewed. The Port a catheter was flushed with a heparin dwell. The patient tolerated the procedure well without immediate postprocedural complication. FINDINGS: Unchanged positioning of anterior chest wall right internal jugular vein approach port a catheter with tip projected over the expected location of cavoatrial junction. There was difficulty aspirating blood from the port a catheter. Contrast injection demonstrated the presence of a fibrin sheath about the distal end of the Port a catheter. There is no evidence of catheter kink or fracture. No contrast extravasation. IMPRESSION: Difficulty aspirating of the Port a catheter is secondary to the presence of a fibrin sheath. Recommend t-PA dwell.  If unsuccessful, consider: 1. Maintain the Port a catheter as is, realizing it may be difficulty for blood draws, but could be used for medication administration.  2. IR to attempt fibrin sheath stripping from the port a catheter tip via a right common femoral vein approach. 3. IR port revision/replacement. Ruthann Cancer, MD [facility] Electronically Signed   By: Ruthann Cancer MD   On: 02/13/2020 11:51    ASSESSMENT & PLAN:   Cancer of head of pancreas ([place]) #  Pancreas adenocarcinoma-Stage IB-/boderline resectable ;Given the abutment of the SMV;  neoadjuvant chemotherapy- on FOLFIRINX.  # proceed with cycle #4 Continue FOLFIRINOX [see below-issue with mediport- on 11/19]. Labs today reviewed;  acceptable for treatment..  We will plan to get a CT scan pancreas protocol after 5-6 cycles.  CA 19-9 improving.  # Severe Hypokalemia- 2.9; Today 3.4; on Kur 20 daily;  Continue Kdur 20 BID.  # Myalgias/Jaw discomfort- ? oxaliplatin-Irinotecan AEs; plan pre-and post atropine.   # Biliary obstruction status post ERCP and stenting-secondary to above malignancy-LFt- WNL.   # mediport malfunction: flushing; not drawing; stat-dye study shows a fibrin sheath.  S/p TPA improved-chemo as planned below.  # DISPOSITION: # FOLFIRINIX on 11/19;  D-3 pump off-11/22; undeyca # DEC 6th-[place]; labs- cbc.cmp;ca-19-9; possible KCL over 1 hour; FOLFIRINOX; d-3 pump-OFF;udenyca Dr.B   All questions were answered. The patient knows to call the clinic with any problems, questions or concerns.    Cammie Sickle, MD 02/13/2020 1:51 PM

## 2020-02-13 NOTE — Progress Notes (Signed)
1139: TPA instilled per Dr. Rogue Bussing. 1210: no blood return noted.  1340: Blood return noted, 10 cc blood wasted.  1350: Per Dr. Rogue Bussing, okay to discharge pt home, pt to return to clinic 02/15/20 for treatment. Pt agrees with plan. Pt stable at discharge.

## 2020-02-13 NOTE — Progress Notes (Unsigned)
Per Dr. Rogue Bussing administer TPA at this time.

## 2020-02-13 NOTE — Assessment & Plan Note (Addendum)
#    Pancreas adenocarcinoma-Stage IB-/boderline resectable ;Given the abutment of the SMV;  neoadjuvant chemotherapy- on FOLFIRINX.  # proceed with cycle #4 Continue FOLFIRINOX [see below-issue with mediport- on 11/19]. Labs today reviewed;  acceptable for treatment..  We will plan to get a CT scan pancreas protocol after 5-6 cycles.  CA 19-9 improving.  # Severe Hypokalemia- 2.9; Today 3.4; on Kur 20 daily;  Continue Kdur 20 BID.  # Myalgias/Jaw discomfort- ? oxaliplatin-Irinotecan AEs; plan pre-and post atropine.   # Biliary obstruction status post ERCP and stenting-secondary to above malignancy-LFt- WNL.   # mediport malfunction: flushing; not drawing; stat-dye study shows a fibrin sheath.  S/p TPA improved-chemo as planned below.  # DISPOSITION: # FOLFIRINIX on 11/19;  D-3 pump off-11/22; undeyca # DEC 6th-[place]; labs- cbc.cmp;ca-19-9; possible KCL over 1 hour; FOLFIRINOX; d-3 pump-OFF;udenyca Dr.B

## 2020-02-13 NOTE — Procedures (Signed)
Interventional Radiology Procedure Note  Procedure: Port check  Findings: Intact port with fibrin sheath surrounding catheter tip.    Complications: None  Estimated Blood Loss: None  Recommendations: Will proceed with t-PA catheter dwell. If unsuccessful, will arrange for port stripping at a later date.   Ruthann Cancer, MD Pager: [phone]

## 2020-02-13 NOTE — Addendum Note (Signed)
Addended by: Delice Bison E on: 02/13/2020 03:48 PM   Modules accepted: Orders

## 2020-02-14 LAB — CANCER ANTIGEN 19-9: CA 19-9: 24 U/mL (ref 0–35)

## 2020-02-15 ENCOUNTER — Other Ambulatory Visit: Payer: Self-pay

## 2020-02-15 ENCOUNTER — Inpatient Hospital Stay: Payer: Medicare Other

## 2020-02-15 VITALS — BP 172/88 | HR 90 | Temp 98.6°F | Resp 18 | Wt 192.0 lb

## 2020-02-15 DIAGNOSIS — Z5111 Encounter for antineoplastic chemotherapy: Secondary | ICD-10-CM | POA: Diagnosis not present

## 2020-02-15 DIAGNOSIS — D378 Neoplasm of uncertain behavior of other specified digestive organs: Secondary | ICD-10-CM

## 2020-02-15 DIAGNOSIS — C250 Malignant neoplasm of head of pancreas: Secondary | ICD-10-CM

## 2020-02-15 MED ORDER — SODIUM CHLORIDE 0.9 % IV SOLN
800.0000 mg | Freq: Once | INTRAVENOUS | Status: DC
  Filled 2020-02-15: qty 40

## 2020-02-15 MED ORDER — SODIUM CHLORIDE 0.9 % IV SOLN
2400.0000 mg/m2 | INTRAVENOUS | Status: DC
  Administered 2020-02-15: 4700 mg via INTRAVENOUS
  Filled 2020-02-15: qty 94

## 2020-02-15 MED ORDER — SODIUM CHLORIDE 0.9 % IV SOLN
INTRAVENOUS | Status: DC
  Filled 2020-02-15: qty 250

## 2020-02-15 MED ORDER — ATROPINE SULFATE 1 MG/ML IJ SOLN
0.5000 mg | Freq: Once | INTRAMUSCULAR | Status: AC
  Administered 2020-02-15: 0.5 mg via INTRAVENOUS

## 2020-02-15 MED ORDER — SODIUM CHLORIDE 0.9 % IV SOLN
10.0000 mg | Freq: Once | INTRAVENOUS | Status: AC
  Administered 2020-02-15: 10 mg via INTRAVENOUS
  Filled 2020-02-15: qty 10

## 2020-02-15 MED ORDER — DEXTROSE 5 % IV SOLN
Freq: Once | INTRAVENOUS | Status: AC
  Filled 2020-02-15: qty 250

## 2020-02-15 MED ORDER — OXALIPLATIN CHEMO INJECTION 100 MG/20ML
85.0000 mg/m2 | Freq: Once | INTRAVENOUS | Status: AC
  Administered 2020-02-15: 165 mg via INTRAVENOUS
  Filled 2020-02-15: qty 33

## 2020-02-15 MED ORDER — SODIUM CHLORIDE 0.9 % IV SOLN
150.0000 mg | Freq: Once | INTRAVENOUS | Status: AC
  Administered 2020-02-15: 150 mg via INTRAVENOUS
  Filled 2020-02-15: qty 150

## 2020-02-15 MED ORDER — LEUCOVORIN CALCIUM INJECTION 100 MG
20.0000 mg/m2 | Freq: Once | INTRAMUSCULAR | Status: AC
  Administered 2020-02-15: 40 mg via INTRAVENOUS
  Filled 2020-02-15: qty 2

## 2020-02-15 MED ORDER — ATROPINE SULFATE 1 MG/ML IJ SOLN
0.5000 mg | Freq: Once | INTRAMUSCULAR | Status: AC
  Filled 2020-02-15: qty 1

## 2020-02-15 MED ORDER — PALONOSETRON HCL INJECTION 0.25 MG/5ML
0.2500 mg | Freq: Once | INTRAVENOUS | Status: AC
  Administered 2020-02-15: 0.25 mg via INTRAVENOUS
  Filled 2020-02-15: qty 5

## 2020-02-15 MED ORDER — SODIUM CHLORIDE 0.9 % IV SOLN
150.0000 mg/m2 | Freq: Once | INTRAVENOUS | Status: AC
  Administered 2020-02-15: 300 mg via INTRAVENOUS
  Filled 2020-02-15: qty 15

## 2020-02-15 NOTE — Progress Notes (Signed)
Pt received prescribed treatment in clinic, pt stable at d/c. 

## 2020-02-18 ENCOUNTER — Ambulatory Visit: Payer: Medicare Other

## 2020-02-18 ENCOUNTER — Inpatient Hospital Stay: Payer: Medicare Other

## 2020-02-18 DIAGNOSIS — Z5111 Encounter for antineoplastic chemotherapy: Secondary | ICD-10-CM | POA: Diagnosis not present

## 2020-02-18 DIAGNOSIS — C250 Malignant neoplasm of head of pancreas: Secondary | ICD-10-CM

## 2020-02-18 MED ORDER — HEPARIN SOD (PORK) LOCK FLUSH 100 UNIT/ML IV SOLN
INTRAVENOUS | Status: AC
  Filled 2020-02-18: qty 5

## 2020-02-18 MED ORDER — HEPARIN SOD (PORK) LOCK FLUSH 100 UNIT/ML IV SOLN
500.0000 [IU] | Freq: Once | INTRAVENOUS | Status: AC | PRN
  Administered 2020-02-18: 500 [IU]
  Filled 2020-02-18: qty 5

## 2020-02-18 MED ORDER — SODIUM CHLORIDE 0.9% FLUSH
10.0000 mL | INTRAVENOUS | Status: DC | PRN
  Administered 2020-02-18: 10 mL
  Filled 2020-02-18: qty 10

## 2020-02-18 MED ORDER — PEGFILGRASTIM-CBQV 6 MG/0.6ML ~~LOC~~ SOSY
6.0000 mg | PREFILLED_SYRINGE | Freq: Once | SUBCUTANEOUS | Status: AC
  Administered 2020-02-18: 6 mg via SUBCUTANEOUS
  Filled 2020-02-18: qty 0.6

## 2020-03-04 ENCOUNTER — Encounter: Payer: Self-pay | Admitting: Internal Medicine

## 2020-03-04 ENCOUNTER — Other Ambulatory Visit: Payer: Self-pay

## 2020-03-04 ENCOUNTER — Inpatient Hospital Stay: Payer: Medicare Other | Attending: Internal Medicine

## 2020-03-04 ENCOUNTER — Inpatient Hospital Stay: Payer: Medicare Other

## 2020-03-04 ENCOUNTER — Inpatient Hospital Stay (HOSPITAL_BASED_OUTPATIENT_CLINIC_OR_DEPARTMENT_OTHER): Payer: Medicare Other | Admitting: Internal Medicine

## 2020-03-04 DIAGNOSIS — Z452 Encounter for adjustment and management of vascular access device: Secondary | ICD-10-CM | POA: Diagnosis not present

## 2020-03-04 DIAGNOSIS — C250 Malignant neoplasm of head of pancreas: Secondary | ICD-10-CM | POA: Diagnosis not present

## 2020-03-04 DIAGNOSIS — K1231 Oral mucositis (ulcerative) due to antineoplastic therapy: Secondary | ICD-10-CM | POA: Diagnosis not present

## 2020-03-04 DIAGNOSIS — I10 Essential (primary) hypertension: Secondary | ICD-10-CM | POA: Diagnosis not present

## 2020-03-04 DIAGNOSIS — Z5111 Encounter for antineoplastic chemotherapy: Secondary | ICD-10-CM | POA: Diagnosis present

## 2020-03-04 DIAGNOSIS — Z5189 Encounter for other specified aftercare: Secondary | ICD-10-CM | POA: Diagnosis not present

## 2020-03-04 LAB — COMPREHENSIVE METABOLIC PANEL
ALT: 16 U/L (ref 0–44)
AST: 23 U/L (ref 15–41)
Albumin: 3.6 g/dL (ref 3.5–5.0)
Alkaline Phosphatase: 109 U/L (ref 38–126)
Anion gap: 13 (ref 5–15)
BUN: 9 mg/dL (ref 8–23)
CO2: 23 mmol/L (ref 22–32)
Calcium: 9.1 mg/dL (ref 8.9–10.3)
Chloride: 100 mmol/L (ref 98–111)
Creatinine, Ser: 0.8 mg/dL (ref 0.44–1.00)
GFR, Estimated: >60 mL/min (ref >60)
Glucose, Bld: 118 mg/dL — ABNORMAL HIGH (ref 70–99)
Potassium: 3.3 mmol/L — ABNORMAL LOW (ref 3.5–5.1)
Sodium: 136 mmol/L (ref 135–145)
Total Bilirubin: 0.5 mg/dL (ref 0.3–1.2)
Total Protein: 7.6 g/dL (ref 6.5–8.1)

## 2020-03-04 LAB — CBC WITH DIFFERENTIAL/PLATELET
Abs Immature Granulocytes: 0.03 10*3/uL (ref 0.00–0.07)
Basophils Absolute: 0 10*3/uL (ref 0.0–0.1)
Basophils Relative: 1 %
Eosinophils Absolute: 0.1 10*3/uL (ref 0.0–0.5)
Eosinophils Relative: 2 %
HCT: 32.6 % — ABNORMAL LOW (ref 36.0–46.0)
Hemoglobin: 10.1 g/dL — ABNORMAL LOW (ref 12.0–15.0)
Immature Granulocytes: 1 %
Lymphocytes Relative: 39 %
Lymphs Abs: 1.7 10*3/uL (ref 0.7–4.0)
MCH: 24 pg — ABNORMAL LOW (ref 26.0–34.0)
MCHC: 31 g/dL (ref 30.0–36.0)
MCV: 77.4 fL — ABNORMAL LOW (ref 80.0–100.0)
Monocytes Absolute: 0.4 10*3/uL (ref 0.1–1.0)
Monocytes Relative: 9 %
Neutro Abs: 2.3 10*3/uL (ref 1.7–7.7)
Neutrophils Relative %: 48 %
Platelets: 277 10*3/uL (ref 150–400)
RBC: 4.21 MIL/uL (ref 3.87–5.11)
RDW: 19.9 % — ABNORMAL HIGH (ref 11.5–15.5)
WBC: 4.5 10*3/uL (ref 4.0–10.5)
nRBC: 0 % (ref 0.0–0.2)

## 2020-03-04 MED ORDER — OXALIPLATIN CHEMO INJECTION 100 MG/20ML
85.0000 mg/m2 | Freq: Once | INTRAVENOUS | Status: AC
  Administered 2020-03-04: 165 mg via INTRAVENOUS
  Filled 2020-03-04: qty 33

## 2020-03-04 MED ORDER — SODIUM CHLORIDE 0.9% FLUSH
10.0000 mL | INTRAVENOUS | Status: DC | PRN
  Administered 2020-03-04: 10 mL via INTRAVENOUS
  Filled 2020-03-04: qty 10

## 2020-03-04 MED ORDER — SODIUM CHLORIDE 0.9 % IV SOLN
150.0000 mg | Freq: Once | INTRAVENOUS | Status: AC
  Administered 2020-03-04: 150 mg via INTRAVENOUS
  Filled 2020-03-04: qty 150

## 2020-03-04 MED ORDER — SODIUM CHLORIDE 0.9 % IV SOLN
800.0000 mg | Freq: Once | INTRAVENOUS | Status: AC
  Administered 2020-03-04: 800 mg via INTRAVENOUS
  Filled 2020-03-04: qty 40

## 2020-03-04 MED ORDER — ATROPINE SULFATE 1 MG/ML IJ SOLN
0.5000 mg | Freq: Once | INTRAMUSCULAR | Status: AC
  Administered 2020-03-04: 0.5 mg via INTRAVENOUS
  Filled 2020-03-04: qty 1

## 2020-03-04 MED ORDER — SODIUM CHLORIDE 0.9 % IV SOLN
150.0000 mg/m2 | Freq: Once | INTRAVENOUS | Status: AC
  Administered 2020-03-04: 300 mg via INTRAVENOUS
  Filled 2020-03-04: qty 15

## 2020-03-04 MED ORDER — SODIUM CHLORIDE 0.9 % IV SOLN
2400.0000 mg/m2 | INTRAVENOUS | Status: DC
  Administered 2020-03-04: 4700 mg via INTRAVENOUS
  Filled 2020-03-04: qty 94

## 2020-03-04 MED ORDER — SODIUM CHLORIDE 0.9 % IV SOLN
10.0000 mg | Freq: Once | INTRAVENOUS | Status: AC
  Administered 2020-03-04: 10 mg via INTRAVENOUS
  Filled 2020-03-04: qty 10

## 2020-03-04 MED ORDER — PALONOSETRON HCL INJECTION 0.25 MG/5ML
0.2500 mg | Freq: Once | INTRAVENOUS | Status: AC
  Administered 2020-03-04: 0.25 mg via INTRAVENOUS
  Filled 2020-03-04: qty 5

## 2020-03-04 MED ORDER — DEXTROSE 5 % IV SOLN
Freq: Once | INTRAVENOUS | Status: AC
  Filled 2020-03-04: qty 250

## 2020-03-04 NOTE — Assessment & Plan Note (Addendum)
#    Pancreas adenocarcinoma-Stage IB-/boderline resectable ;Given the abutment of the SMV;  neoadjuvant chemotherapy- on FOLFIRINX. STABLE.   # proceed with cycle #5  Continue FOLFIRINOX Labs today reviewed;  acceptable for treatment..  We will plan to get a CT scan pancreas protocol after this cycle; CT ordered  CA 19-9 improving.  # Mucositis- sec chemotherapy.  Salt/baking soda rinses.. magic mouth wash..   # Severe Hypokalemia- 2.9; Today 3.3; on Kur 20 daily;  Continue Kdur 20 BID.  # Myalgias/Jaw discomfort-stable.? oxaliplatin-Irinotecan AEs; plan pre-and post atropine.   # mediport malfunction: flushing; not drawing; NOV- dye study shows a fibrin sheath.  Ok to proceed with chemo; plan TPA on day-3.   # DISPOSITION: magic mouth  # FOLFIRONOX today; pump off d-3/udenyca # DEC 21st -[place]; labs- cbc.cmp;ca-19-9; possible KCL over 1 hour; FOLFIRINOX; d-3 pump-OFF;udenyca; CT prior Dr.B

## 2020-03-04 NOTE — Progress Notes (Signed)
0940: Per Dr. Rogue Bussing all labs and VS reviewed and okay to proceed with treatment at this time, no additional orders. No blood return noted from port, port flushes without difficulty, no swelling, pain or redness noted, per Dr. Rogue Bussing okay to use port for Folfirinox treatment including Adrucil pump. Per Dr. Rogue Bussing give 0.5mg  IV Atropine prior to Irinotecan and 0.5mg  IV Atropine post Irinotecan.   1510: Pt tolerated infusion well, no s/s of distress or reaction noted. Pt stable at discharge.

## 2020-03-04 NOTE — Progress Notes (Signed)
[facility] NOTE  Patient Care Team: [place] as PCP - [name], [place], [place] (Family Medicine) Rico Junker, RN as Registered Nurse Theodore Demark, RN as Registered Nurse Clent Jacks, RN as Oncology Nurse Navigator  CHIEF COMPLAINTS/PURPOSE OF CONSULTATION: pancreas adenocarcinoma   Oncology History Overview Note  #Pancreas adenocarcinoma-Stage IB- [id number]-[EUS- Dr.Spaete; [facility]/GI; mass 22x86mm; invading/abutting superior mesenteric vein; 2 enlarged lymph nodes peripancreatic/porta hepatis largest 10 x 5.8 mm-nonpathologic based on EUS criteria/no biopsy]-borderline resectable.  PET scan no evidence of distant metastatic disease.  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 Stevan Born September]  # 01/02/2020-FOLFIRINOX; Neulasta.  # Borderline DM; HTN   # SURVIVORSHIP:   # GENETICS:   DIAGNOSIS: Pancreatic cancer  STAGE:  IB/borderline resectable;  GOALS: cure  CURRENT/MOST RECENT THERAPY : Neoadjuvant-FOLFIRINOX    Cancer of head of pancreas ([place])  12/04/2019 Initial Diagnosis   Cancer of head of pancreas ([place])   12/19/2019 - 12/19/2019 Chemotherapy   The patient had PACLitaxel-protein bound (ABRAXANE) chemo infusion 250 mg, 125 mg/m2 = 250 mg, Intravenous,  Once, 1 of 4 cycles Administration: 250 mg (12/19/2019) gemcitabine (GEMZAR) 2,000 mg in sodium chloride 0.9 % 250 mL chemo infusion, 1,976 mg, Intravenous,  Once, 1 of 4 cycles Administration: 2,000 mg (12/19/2019)  for chemotherapy treatment.    01/02/2020 -  Chemotherapy   The patient had dexamethasone (DECADRON) 4 MG tablet, 8 mg, Oral, Daily, 1 of 1 cycle, Start date: --, End date: -- palonosetron (ALOXI) injection 0.25 mg, 0.25 mg, Intravenous,  Once, 5 of 8 cycles Administration: 0.25 mg (01/02/2020), 0.25 mg (01/16/2020),  0.25 mg (01/30/2020), 0.25 mg (02/15/2020) pegfilgrastim-cbqv (UDENYCA) injection 6 mg, 6 mg, Subcutaneous, Once, 5 of 8 cycles Administration: 6 mg (01/04/2020), 6 mg (01/18/2020), 6 mg (02/01/2020), 6 mg (02/18/2020) irinotecan (CAMPTOSAR) 300 mg in sodium chloride 0.9 % 500 mL chemo infusion, 150 mg/m2 = 300 mg, Intravenous,  Once, 5 of 8 cycles Administration: 300 mg (01/02/2020), 300 mg (01/16/2020), 300 mg (01/30/2020), 300 mg (02/15/2020) oxaliplatin (ELOXATIN) 165 mg in dextrose 5 % 500 mL chemo infusion, 85 mg/m2 = 165 mg, Intravenous,  Once, 5 of 8 cycles Administration: 165 mg (01/02/2020), 165 mg (01/16/2020), 165 mg (01/30/2020), 165 mg (02/15/2020) fosaprepitant (EMEND) 150 mg in sodium chloride 0.9 % 145 mL IVPB, 150 mg, Intravenous,  Once, 5 of 8 cycles Administration: 150 mg (01/02/2020), 150 mg (01/16/2020), 150 mg (01/30/2020), 150 mg (02/15/2020) fluorouracil (ADRUCIL) 4,700 mg in sodium chloride 0.9 % 56 mL chemo infusion, 2,400 mg/m2 = 4,700 mg, Intravenous, 1 Day/Dose, 5 of 8 cycles Administration: 4,700 mg (01/02/2020), 4,700 mg (01/16/2020), 4,700 mg (01/30/2020), 4,700 mg (02/15/2020) leucovorin 800 mg in sodium chloride 0.9 % 250 mL infusion, 784 mg, Intravenous,  Once, 5 of 8 cycles Administration: 800 mg (01/02/2020), 800 mg (01/16/2020), 800 mg (01/30/2020)  for chemotherapy treatment.     HISTORY OF PRESENTING ILLNESS:  Robin Daniels 64 y.o.  female borderline resectable pancreatic adeno ca currently on neoadjuvant chemotherapy with FOLFIRINOX.  Patient is currently status post 4 cycles of chemotherapy.  Patient noted to have sores in the mouth approximately a week after chemotherapy.  Patient had difficulty swallowing pain.  Unfortunately she did not call us.  Currently oral sores are resolved.  No pain.  Mild tingling in the extremities.    Review of Systems  Constitutional: Positive  for weight loss. Negative for chills, diaphoresis, fever and malaise/fatigue.  HENT:  Negative for nosebleeds and sore throat.   Eyes: Negative for double vision.  Respiratory: Negative for cough, hemoptysis, sputum production, shortness of breath and wheezing.   Cardiovascular: Negative for chest pain, palpitations, orthopnea and leg swelling.  Gastrointestinal: Negative for abdominal pain, blood in stool, constipation, diarrhea, heartburn, melena, nausea and vomiting.  Genitourinary: Negative for dysuria, frequency and urgency.  Musculoskeletal: Negative for back pain and joint pain.  Skin: Negative.  Negative for itching and rash.  Neurological: Negative for dizziness, tingling, focal weakness, weakness and headaches.  Endo/Heme/Allergies: Does not bruise/bleed easily.  Psychiatric/Behavioral: Negative for depression. The patient is not nervous/anxious and does not have insomnia.      MEDICAL HISTORY:  Past Medical History:  Diagnosis Date  . Anemia    history of  . Anxiety 09/11/2014  . Arthritis   . Cancer of head of pancreas ([place]) 12/04/2019  . Diabetes mellitus without complication ([place])   . Fluttering heart   . Hyperlipidemia   . Hypertension     SURGICAL HISTORY: Past Surgical History:  Procedure Laterality Date  . ENDOSCOPIC RETROGRADE CHOLANGIOPANCREATOGRAPHY (ERCP) WITH PROPOFOL N/A 11/16/2019   Procedure: ENDOSCOPIC RETROGRADE CHOLANGIOPANCREATOGRAPHY (ERCP) WITH PROPOFOL;  Surgeon: Lucilla Lame, MD;  Location: [facility] ENDOSCOPY;  Service: Endoscopy;  Laterality: N/A;  . EUS N/A 11/29/2019   Procedure: FULL UPPER ENDOSCOPIC ULTRASOUND (EUS) RADIAL;  Surgeon: Reita Cliche, MD;  Location: [facility] ENDOSCOPY;  Service: Gastroenterology;  Laterality: N/A;  . IR IMAGING GUIDED PORT INSERTION  12/14/2019  . right knee replacement Right [id number]  . SHOULDER ARTHROSCOPY W/ ROTATOR CUFF REPAIR Right     SOCIAL HISTORY: Social History   Socioeconomic History  . Marital status: Divorced    Spouse name: Not on file  . Number of children: Not on file  . Years of  education: Not on file  . Highest education level: Not on file  Occupational History  . Not on file  Tobacco Use  . Smoking status: Never Smoker  . Smokeless tobacco: Never Used  Vaping Use  . Vaping Use: Never used  Substance and Sexual Activity  . Alcohol use: No    Alcohol/week: 0.0 standard drinks  . Drug use: No  . Sexual activity: Never  Other Topics Concern  . Not on file  Social History Narrative   Lives in [place]; with mom and son; worked in dietary; never smoked; no alcohol.    Social Determinants of Health   Financial Resource Strain:   . Difficulty of Paying Living Expenses: Not on file  Food Insecurity:   . Worried About Charity fundraiser in the Last Year: Not on file  . Ran Out of Food in the Last Year: Not on file  Transportation Needs:   . Lack of Transportation (Medical): Not on file  . Lack of Transportation (Non-Medical): Not on file  Physical Activity:   . Days of Exercise per Week: Not on file  . Minutes of Exercise per Session: Not on file  Stress:   . Feeling of Stress : Not on file  Social Connections:   . Frequency of Communication with Friends and Family: Not on file  . Frequency of Social Gatherings with Friends and Family: Not on file  . Attends Religious Services: Not on file  . Active Member of Clubs or Organizations: Not on file  . Attends Archivist Meetings: Not on file  . Marital Status: Not  on file  Intimate Partner Violence:   . Fear of Current or Ex-Partner: Not on file  . Emotionally Abused: Not on file  . Physically Abused: Not on file  . Sexually Abused: Not on file    FAMILY HISTORY: Family History  Problem Relation Age of Onset  . Diabetes Mother   . Hyperlipidemia Mother   . Hypertension Mother   . Vision loss Mother   . Arthritis Father   . Hyperlipidemia Father   . Hypertension Father   . Breast cancer Maternal Aunt     ALLERGIES:  has No Known Allergies.  MEDICATIONS:  Current Outpatient  Medications  Medication Sig Dispense Refill  . acetaminophen (TYLENOL) 650 MG CR tablet Take 1 tablet by mouth every 8 (eight) hours as needed.    Marland Kitchen amLODipine (NORVASC) 10 MG tablet Take 10 mg by mouth daily.     Marland Kitchen lidocaine-prilocaine (EMLA) cream SMARTSIG:3 Topical 10 Times Daily PRN    . metoprolol succinate (TOPROL-XL) 50 MG 24 hr tablet Take 50 mg by mouth daily. Take with or immediately following a meal.     . Potassium Chloride ER 20 MEQ TBCR Take 20 mEq by mouth in the morning and at bedtime. 60 tablet 1  . vitamin B-12 (CYANOCOBALAMIN) 1000 MCG tablet Take 1 tablet (1,000 mcg total) by mouth daily.    . diphenoxylate-atropine (LOMOTIL) 2.5-0.025 MG tablet Take 1 tablet by mouth 4 (four) times daily as needed for diarrhea or loose stools. Take it along with immodium (Patient not taking: Reported on 03/04/2020) 45 tablet 0   No current facility-administered medications for this visit.   Facility-Administered Medications Ordered in Other Visits  Medication Dose Route Frequency Provider Last Rate Last Admin  . atropine injection 0.5 mg  0.5 mg Intravenous Once Charlaine Dalton R, MD      . fluorouracil (ADRUCIL) 4,700 mg in sodium chloride 0.9 % 56 mL chemo infusion  2,400 mg/m2 (Treatment Plan Recorded) Intravenous 1 day or 1 dose Charlaine Dalton R, MD      . irinotecan (CAMPTOSAR) 300 mg in sodium chloride 0.9 % 500 mL chemo infusion  150 mg/m2 (Treatment Plan Recorded) Intravenous Once Charlaine Dalton R, MD      . leucovorin 800 mg in sodium chloride 0.9 % 250 mL infusion  800 mg Intravenous Once Charlaine Dalton R, MD      . oxaliplatin (ELOXATIN) 165 mg in dextrose 5 % 500 mL chemo infusion  85 mg/m2 (Treatment Plan Recorded) Intravenous Once Cammie Sickle, MD 267 mL/hr at 03/04/20 1056 165 mg at 03/04/20 1056  . sodium chloride flush (NS) 0.9 % injection 10 mL  10 mL Intravenous PRN Charlaine Dalton R, MD   10 mL at 03/04/20 0900      .  PHYSICAL  EXAMINATION: ECOG PERFORMANCE STATUS: 1 - Symptomatic but completely ambulatory  Vitals:   03/04/20 0933  BP: (!) 164/90  Pulse: 76  Resp: 18  Temp: 97.6 F (36.4 C)  SpO2: 98%   Filed Weights   03/04/20 0933  Weight: 192 lb (87.1 kg)    Physical Exam HENT:     Head: Normocephalic and atraumatic.     Mouth/Throat:     Pharynx: No oropharyngeal exudate.  Eyes:     Pupils: Pupils are equal, round, and reactive to light.  Cardiovascular:     Rate and Rhythm: Normal rate and regular rhythm.  Pulmonary:     Effort: Pulmonary effort is normal. No respiratory distress.  Breath sounds: Normal breath sounds. No wheezing.  Abdominal:     General: Bowel sounds are normal. There is no distension.     Palpations: Abdomen is soft. There is no mass.     Tenderness: There is no abdominal tenderness. There is no guarding or rebound.  Musculoskeletal:        General: No tenderness. Normal range of motion.     Cervical back: Normal range of motion and neck supple.  Skin:    General: Skin is warm.  Neurological:     Mental Status: She is alert and oriented to person, place, and time.  Psychiatric:        Mood and Affect: Affect normal.      LABORATORY DATA:  I have reviewed the data as listed Lab Results  Component Value Date   WBC 4.5 03/04/2020   HGB 10.1 (L) 03/04/2020   HCT 32.6 (L) 03/04/2020   MCV 77.4 (L) 03/04/2020   PLT 277 03/04/2020   Recent Labs    11/21/19 1449 11/21/19 1449 12/04/19 1327 12/04/19 1327 12/19/19 0833 01/02/20 0831 01/30/20 0854 01/30/20 0854 02/06/20 1022 02/13/20 0842 03/04/20 0851  NA 133*   < > 136   < > 139   < > 138   < > 135 137 136  K 4.2   < > 3.6   < > 3.2*   < > 2.9*   < > 3.9 3.2* 3.3*  CL 91*   < > 102   < > 102   < > 101   < > 100 102 100  CO2 30   < > 26   < > 26   < > 24   < > 26 24 23   GLUCOSE 169*   < > 126*   < > 168*   < > 116*   < > 115* 116* 118*  BUN 15   < > 7*   < > 8   < > 6*   < > 7* 8 9  CREATININE 1.02*    < > 0.90   < > 0.89   < > 0.85   < > 0.81 0.96 0.80  CALCIUM 9.0   < > 8.7*   < > 8.9   < > 8.9   < > 8.5* 9.2 9.1  GFRNONAA 57*   < > >60   < > >60   < > >60   < > >60 >60 >60  GFRAA >60  --  >60  --  >60  --   --   --   --   --   --   PROT 8.3*   < > 7.3   < > 7.6   < > 7.5  --   --  7.5 7.6  ALBUMIN 3.4*   < > 3.5   < > 3.5   < > 3.6  --   --  3.6 3.6  AST 45*   < > 19   < > 35   < > 26  --   --  26 23  ALT 137*   < > 22   < > 50*   < > 23  --   --  22 16  ALKPHOS 405*   < > 156*   < > 294*   < > 107  --   --  111 109  BILITOT 1.8*   < > 0.9   < > 0.7   < >  0.3  --   --  0.4 0.5   < > = values in this interval not displayed.    RADIOGRAPHIC STUDIES: I have personally reviewed the radiological images as listed and agreed with the findings in the report. DG Fluoro Guide CV Line Right  Result Date: 02/13/2020 INDICATION: 64 year old female with pancreatic cancer and indwelling right IJ single-lumen port placed 12/14/2019 presenting with difficulty with aspiration from port. EXAM: FLUOROSCOPIC GUIDED PORT A CATHETER CHECK MEDICATIONS: None. CONTRAST:  5 mL Omnipaque 300 FLUOROSCOPY TIME:  0 seconds (73 mGy) COMPLICATIONS: None immediate. TECHNIQUE: The procedure, risks, benefits, and alternatives were explained to the patient and informed written consent was obtained. A timeout was performed prior to the initiation of the procedure. The patient's chest port a catheter was accessed by the IV team. The patient was placed supine on the fluoroscopy table. A preprocedural spot fluoroscopic image was obtained of the chest in existing port a catheter. Note was made of difficulty aspirating blood from the port a catheter. Contrast was injected via the Port a catheter and images were reviewed. The Port a catheter was flushed with a heparin dwell. The patient tolerated the procedure well without immediate postprocedural complication. FINDINGS: Unchanged positioning of anterior chest wall right internal  jugular vein approach port a catheter with tip projected over the expected location of cavoatrial junction. There was difficulty aspirating blood from the port a catheter. Contrast injection demonstrated the presence of a fibrin sheath about the distal end of the Port a catheter. There is no evidence of catheter kink or fracture. No contrast extravasation. IMPRESSION: Difficulty aspirating of the Port a catheter is secondary to the presence of a fibrin sheath. Recommend t-PA dwell.  If unsuccessful, consider: 1. Maintain the Port a catheter as is, realizing it may be difficulty for blood draws, but could be used for medication administration. 2. IR to attempt fibrin sheath stripping from the port a catheter tip via a right common femoral vein approach. 3. IR port revision/replacement. Ruthann Cancer, MD [facility] Radiology Electronically Signed   By: Ruthann Cancer MD   On: 02/13/2020 11:51    ASSESSMENT & PLAN:   Cancer of head of pancreas ([place]) #  Pancreas adenocarcinoma-Stage IB-/boderline resectable ;Given the abutment of the SMV;  neoadjuvant chemotherapy- on FOLFIRINX. STABLE.   # proceed with cycle #5  Continue FOLFIRINOX Labs today reviewed;  acceptable for treatment..  We will plan to get a CT scan pancreas protocol after this cycle; CT ordered  CA 19-9 improving.  # Mucositis- sec chemotherapy.  Salt/baking soda rinses.. magic mouth wash..   # Severe Hypokalemia- 2.9; Today 3.3; on Kur 20 daily;  Continue Kdur 20 BID.  # Myalgias/Jaw discomfort-stable.? oxaliplatin-Irinotecan AEs; plan pre-and post atropine.   # mediport malfunction: flushing; not drawing; NOV- dye study shows a fibrin sheath.  Ok to proceed with chemo; plan TPA on day-3.   # DISPOSITION: magic mouth  # FOLFIRONOX today; pump off d-3/udenyca # DEC 21st -MD; labs- cbc.cmp;ca-19-9; possible KCL over 1 hour; FOLFIRINOX; d-3 pump-OFF;udenyca; CT prior Dr.B   All questions  were answered. The patient knows to call the clinic with any problems, questions or concerns.    Cammie Sickle, MD 03/04/2020 12:44 PM

## 2020-03-04 NOTE — Progress Notes (Signed)
Pt in for follow up, was lately for appt and very anxious.  Pt reports after last treatment which was week thanksgiving, she had blisters in mouth and on lips, states niece ordered a oncology mouthwash online which helped blisters. Reports able to eat well now.

## 2020-03-05 LAB — CANCER ANTIGEN 19-9: CA 19-9: 20 U/mL (ref 0–35)

## 2020-03-06 ENCOUNTER — Other Ambulatory Visit: Payer: Self-pay

## 2020-03-06 ENCOUNTER — Inpatient Hospital Stay: Payer: Medicare Other

## 2020-03-06 DIAGNOSIS — C250 Malignant neoplasm of head of pancreas: Secondary | ICD-10-CM

## 2020-03-06 DIAGNOSIS — Z5111 Encounter for antineoplastic chemotherapy: Secondary | ICD-10-CM | POA: Diagnosis not present

## 2020-03-06 MED ORDER — HEPARIN SOD (PORK) LOCK FLUSH 100 UNIT/ML IV SOLN
INTRAVENOUS | Status: AC
  Filled 2020-03-06: qty 5

## 2020-03-06 MED ORDER — SODIUM CHLORIDE 0.9% FLUSH
10.0000 mL | INTRAVENOUS | Status: DC | PRN
  Administered 2020-03-06: 10 mL
  Filled 2020-03-06: qty 10

## 2020-03-06 MED ORDER — HEPARIN SOD (PORK) LOCK FLUSH 100 UNIT/ML IV SOLN
500.0000 [IU] | Freq: Once | INTRAVENOUS | Status: AC | PRN
  Administered 2020-03-06: 500 [IU]
  Filled 2020-03-06: qty 5

## 2020-03-06 MED ORDER — PEGFILGRASTIM-CBQV 6 MG/0.6ML ~~LOC~~ SOSY
6.0000 mg | PREFILLED_SYRINGE | Freq: Once | SUBCUTANEOUS | Status: AC
  Administered 2020-03-06: 6 mg via SUBCUTANEOUS
  Filled 2020-03-06: qty 0.6

## 2020-03-13 ENCOUNTER — Other Ambulatory Visit: Payer: Self-pay

## 2020-03-13 ENCOUNTER — Ambulatory Visit
Admission: RE | Admit: 2020-03-13 | Discharge: 2020-03-13 | Disposition: A | Discharge status: Home / Self Care | Payer: Medicare Other | Source: Ambulatory Visit | Attending: Internal Medicine | Admitting: Internal Medicine

## 2020-03-13 DIAGNOSIS — C250 Malignant neoplasm of head of pancreas: Secondary | ICD-10-CM

## 2020-03-13 MED ORDER — IOHEXOL 300 MG/ML  SOLN
100.0000 mL | Freq: Once | INTRAMUSCULAR | Status: AC | PRN
  Administered 2020-03-13: 100 mL via INTRAVENOUS

## 2020-03-14 ENCOUNTER — Telehealth: Payer: Self-pay | Admitting: *Deleted

## 2020-03-14 ENCOUNTER — Other Ambulatory Visit: Payer: Self-pay

## 2020-03-14 DIAGNOSIS — Z4582 Encounter for adjustment or removal of myringotomy device (stent) (tube): Secondary | ICD-10-CM

## 2020-03-14 NOTE — Telephone Encounter (Signed)
Called report  IMPRESSION: 1. Pancreatic lesion with ill-defined margins appears similar accounting for very limited visualization of discrete lesion on the current scan. 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 No signs of arterial involvement. 2. Cystic area adjacent to the neck of the pancreas is nearly completely resolved. Perhaps a very small 9 mm focus at the site of a previously 2.5 x 1.5 cm area. 3. Small lymph nodes about the porta hepatis and pancreas show little change. Slightly increased size of a peripancreatic lymph node, this remains less than a cm. 4. Persistent biliary duct distension with biliary stent in situ. 5. Gallbladder with mural stratification and mucosal enhancement, less distended than on the prior study. This change likely relates to degree of distension and prior inflammation. Correlate with any symptoms that would suggest ongoing gallbladder pathology/cholecystitis. Reportedly the patient was asymptomatic at the time of scan. 6. Aortic atherosclerosis.  These results will be called to the ordering clinician or representative by the Radiologist Assistant, and communication documented in the PACS or [employer].  Aortic Atherosclerosis (ICD10-I70.0).   Electronically Signed   By: Zetta Bills M.D.   On: 03/13/2020 16:56

## 2020-03-17 ENCOUNTER — Other Ambulatory Visit: Payer: Self-pay | Admitting: *Deleted

## 2020-03-17 DIAGNOSIS — C250 Malignant neoplasm of head of pancreas: Secondary | ICD-10-CM

## 2020-03-18 ENCOUNTER — Encounter: Payer: Self-pay | Admitting: Internal Medicine

## 2020-03-18 ENCOUNTER — Inpatient Hospital Stay (HOSPITAL_BASED_OUTPATIENT_CLINIC_OR_DEPARTMENT_OTHER): Payer: Medicare Other | Admitting: Internal Medicine

## 2020-03-18 ENCOUNTER — Inpatient Hospital Stay: Payer: Medicare Other

## 2020-03-18 ENCOUNTER — Other Ambulatory Visit: Payer: Self-pay

## 2020-03-18 VITALS — BP 187/82 | HR 99 | Temp 97.4°F | Resp 18 | Wt 194.0 lb

## 2020-03-18 DIAGNOSIS — Z5111 Encounter for antineoplastic chemotherapy: Secondary | ICD-10-CM | POA: Diagnosis not present

## 2020-03-18 DIAGNOSIS — C250 Malignant neoplasm of head of pancreas: Secondary | ICD-10-CM

## 2020-03-18 LAB — COMPREHENSIVE METABOLIC PANEL
ALT: 17 U/L (ref 0–44)
AST: 28 U/L (ref 15–41)
Albumin: 3.4 g/dL — ABNORMAL LOW (ref 3.5–5.0)
Alkaline Phosphatase: 113 U/L (ref 38–126)
Anion gap: 9 (ref 5–15)
BUN: 8 mg/dL (ref 8–23)
CO2: 23 mmol/L (ref 22–32)
Calcium: 8.7 mg/dL — ABNORMAL LOW (ref 8.9–10.3)
Chloride: 104 mmol/L (ref 98–111)
Creatinine, Ser: 0.89 mg/dL (ref 0.44–1.00)
GFR, Estimated: >60 mL/min (ref >60)
Glucose, Bld: 131 mg/dL — ABNORMAL HIGH (ref 70–99)
Potassium: 3 mmol/L — ABNORMAL LOW (ref 3.5–5.1)
Sodium: 136 mmol/L (ref 135–145)
Total Bilirubin: 0.4 mg/dL (ref 0.3–1.2)
Total Protein: 7.3 g/dL (ref 6.5–8.1)

## 2020-03-18 LAB — CBC WITH DIFFERENTIAL/PLATELET
Abs Immature Granulocytes: 0.13 10*3/uL — ABNORMAL HIGH (ref 0.00–0.07)
Basophils Absolute: 0 10*3/uL (ref 0.0–0.1)
Basophils Relative: 0 %
Eosinophils Absolute: 0.1 10*3/uL (ref 0.0–0.5)
Eosinophils Relative: 2 %
HCT: 28.5 % — ABNORMAL LOW (ref 36.0–46.0)
Hemoglobin: 9.2 g/dL — ABNORMAL LOW (ref 12.0–15.0)
Immature Granulocytes: 2 %
Lymphocytes Relative: 39 %
Lymphs Abs: 2.1 10*3/uL (ref 0.7–4.0)
MCH: 24.7 pg — ABNORMAL LOW (ref 26.0–34.0)
MCHC: 32.3 g/dL (ref 30.0–36.0)
MCV: 76.4 fL — ABNORMAL LOW (ref 80.0–100.0)
Monocytes Absolute: 0.5 10*3/uL (ref 0.1–1.0)
Monocytes Relative: 9 %
Neutro Abs: 2.7 10*3/uL (ref 1.7–7.7)
Neutrophils Relative %: 48 %
Platelets: 197 10*3/uL (ref 150–400)
RBC: 3.73 MIL/uL — ABNORMAL LOW (ref 3.87–5.11)
RDW: 19.2 % — ABNORMAL HIGH (ref 11.5–15.5)
WBC: 5.5 10*3/uL (ref 4.0–10.5)
nRBC: 0 % (ref 0.0–0.2)

## 2020-03-18 MED ORDER — OXALIPLATIN CHEMO INJECTION 100 MG/20ML
85.0000 mg/m2 | Freq: Once | INTRAVENOUS | Status: AC
  Administered 2020-03-18: 165 mg via INTRAVENOUS
  Filled 2020-03-18: qty 33

## 2020-03-18 MED ORDER — ATROPINE SULFATE 1 MG/ML IJ SOLN
0.5000 mg | Freq: Once | INTRAMUSCULAR | Status: AC
  Administered 2020-03-18: 0.5 mg via INTRAVENOUS
  Filled 2020-03-18: qty 1

## 2020-03-18 MED ORDER — SODIUM CHLORIDE 0.9 % IV SOLN
150.0000 mg | Freq: Once | INTRAVENOUS | Status: AC
  Administered 2020-03-18: 150 mg via INTRAVENOUS
  Filled 2020-03-18: qty 150

## 2020-03-18 MED ORDER — SODIUM CHLORIDE 0.9 % IV SOLN
150.0000 mg/m2 | Freq: Once | INTRAVENOUS | Status: AC
  Administered 2020-03-18: 300 mg via INTRAVENOUS
  Filled 2020-03-18: qty 15

## 2020-03-18 MED ORDER — SODIUM CHLORIDE 0.9 % IV SOLN
2400.0000 mg/m2 | INTRAVENOUS | Status: DC
  Administered 2020-03-18: 4700 mg via INTRAVENOUS
  Filled 2020-03-18: qty 94

## 2020-03-18 MED ORDER — LEUCOVORIN CALCIUM INJECTION 350 MG
800.0000 mg | Freq: Once | INTRAMUSCULAR | Status: AC
  Administered 2020-03-18: 800 mg via INTRAVENOUS
  Filled 2020-03-18: qty 35

## 2020-03-18 MED ORDER — SODIUM CHLORIDE 0.9 % IV SOLN
10.0000 mg | Freq: Once | INTRAVENOUS | Status: AC
  Administered 2020-03-18: 10 mg via INTRAVENOUS
  Filled 2020-03-18: qty 10

## 2020-03-18 MED ORDER — ATROPINE SULFATE 1 MG/ML IJ SOLN
INTRAMUSCULAR | Status: AC
  Filled 2020-03-18: qty 1

## 2020-03-18 MED ORDER — DEXTROSE 5 % IV SOLN
Freq: Once | INTRAVENOUS | Status: AC
  Filled 2020-03-18: qty 250

## 2020-03-18 MED ORDER — SODIUM CHLORIDE 0.9 % IV SOLN
Freq: Once | INTRAVENOUS | Status: AC
  Filled 2020-03-18: qty 250

## 2020-03-18 MED ORDER — PALONOSETRON HCL INJECTION 0.25 MG/5ML
0.2500 mg | Freq: Once | INTRAVENOUS | Status: AC
  Administered 2020-03-18: 0.25 mg via INTRAVENOUS
  Filled 2020-03-18: qty 5

## 2020-03-18 MED ORDER — ATROPINE SULFATE 1 MG/ML IJ SOLN: 0.5000 mg | Freq: Once | INTRAMUSCULAR | Status: DC

## 2020-03-18 MED ORDER — SODIUM CHLORIDE 0.9% FLUSH
10.0000 mL | INTRAVENOUS | Status: DC | PRN
  Administered 2020-03-18: 10 mL via INTRAVENOUS
  Filled 2020-03-18: qty 10

## 2020-03-18 MED ORDER — POTASSIUM CHLORIDE ER 20 MEQ PO TBCR: 20.0000 meq | EXTENDED_RELEASE_TABLET | Freq: Two times a day (BID) | ORAL | 1 refills | Status: DC

## 2020-03-18 MED ORDER — ATROPINE SULFATE 1 MG/ML IJ SOLN
0.5000 mg | Freq: Once | INTRAMUSCULAR | Status: AC
  Administered 2020-03-18: 0.5 mg via INTRAVENOUS

## 2020-03-18 NOTE — Progress Notes (Signed)
BP out of parameters.  Per MD ok to continue with tx today

## 2020-03-18 NOTE — Progress Notes (Signed)
To get one dose of atropine before and after irinotecan

## 2020-03-18 NOTE — Progress Notes (Signed)
Per Dr. Rogue Bussing okay to proceed with scheduled folfirinox treatment with B/P  187/82 and Potassium 3.0. Per Dr. Rogue Bussing no need for IV Potassium at this time, and pt to receive one dose of Atropine prior to Irinotecan and one dose of Atropine post Irinotecan. **see MAR**.    1505: pt tolerated infusion well. No s/s of distress or reaction noted. Pt stable at discharge.

## 2020-03-18 NOTE — Progress Notes (Signed)
[place] NOTE  Patient Care Team: Playita Cortada as PCP - General [name], [place], [place] (Family Medicine) Rico Junker, RN as Registered Nurse Theodore Demark, RN as Registered Nurse Clent Jacks, RN as Oncology Nurse Navigator  CHIEF COMPLAINTS/PURPOSE OF CONSULTATION: pancreas adenocarcinoma   Oncology History Overview Note  #Pancreas adenocarcinoma-Stage IB- [id number]-[EUS- Dr.Spaete; [facility]/GI; mass 22x1mm; invading/abutting superior mesenteric vein; 2 enlarged lymph nodes peripancreatic/porta hepatis largest 10 x 5.8 mm-nonpathologic based on EUS criteria/no biopsy]-borderline resectable.  PET scan no evidence of distant metastatic disease.  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 Stevan Born September]  # 01/02/2020-FOLFIRINOX; Neulasta.  # Borderline DM; HTN   # SURVIVORSHIP:   # GENETICS:   DIAGNOSIS: Pancreatic cancer  STAGE:  IB/borderline resectable;  GOALS: cure  CURRENT/MOST RECENT THERAPY : Neoadjuvant-FOLFIRINOX    Cancer of head of pancreas ([place])  12/04/2019 Initial Diagnosis   Cancer of head of pancreas ([place])   12/19/2019 - 12/19/2019 Chemotherapy   The patient had PACLitaxel-protein bound (ABRAXANE) chemo infusion 250 mg, 125 mg/m2 = 250 mg, Intravenous,  Once, 1 of 4 cycles Administration: 250 mg (12/19/2019) gemcitabine (GEMZAR) 2,000 mg in sodium chloride 0.9 % 250 mL chemo infusion, 1,976 mg, Intravenous,  Once, 1 of 4 cycles Administration: 2,000 mg (12/19/2019)  for chemotherapy treatment.    01/02/2020 -  Chemotherapy   The patient had dexamethasone (DECADRON) 4 MG tablet, 8 mg, Oral, Daily, 1 of 1 cycle, Start date: --, End date: -- palonosetron (ALOXI) injection 0.25 mg, 0.25 mg, Intravenous,  Once, 6 of 8 cycles Administration: 0.25 mg (01/02/2020), 0.25 mg (03/04/2020), 0.25  mg (01/16/2020), 0.25 mg (01/30/2020), 0.25 mg (02/15/2020) pegfilgrastim-cbqv (UDENYCA) injection 6 mg, 6 mg, Subcutaneous, Once, 6 of 8 cycles Administration: 6 mg (01/04/2020), 6 mg (01/18/2020), 6 mg (03/06/2020), 6 mg (02/01/2020), 6 mg (02/18/2020) irinotecan (CAMPTOSAR) 300 mg in sodium chloride 0.9 % 500 mL chemo infusion, 150 mg/m2 = 300 mg, Intravenous,  Once, 6 of 8 cycles Administration: 300 mg (01/02/2020), 300 mg (03/04/2020), 300 mg (01/16/2020), 300 mg (01/30/2020), 300 mg (02/15/2020) oxaliplatin (ELOXATIN) 165 mg in dextrose 5 % 500 mL chemo infusion, 85 mg/m2 = 165 mg, Intravenous,  Once, 6 of 8 cycles Administration: 165 mg (01/02/2020), 165 mg (03/04/2020), 165 mg (01/16/2020), 165 mg (01/30/2020), 165 mg (02/15/2020) fosaprepitant (EMEND) 150 mg in sodium chloride 0.9 % 145 mL IVPB, 150 mg, Intravenous,  Once, 6 of 8 cycles Administration: 150 mg (01/02/2020), 150 mg (03/04/2020), 150 mg (01/16/2020), 150 mg (01/30/2020), 150 mg (02/15/2020) fluorouracil (ADRUCIL) 4,700 mg in sodium chloride 0.9 % 56 mL chemo infusion, 2,400 mg/m2 = 4,700 mg, Intravenous, 1 Day/Dose, 6 of 8 cycles Administration: 4,700 mg (01/02/2020), 4,700 mg (03/04/2020), 4,700 mg (01/16/2020), 4,700 mg (01/30/2020), 4,700 mg (02/15/2020) leucovorin 800 mg in sodium chloride 0.9 % 250 mL infusion, 784 mg, Intravenous,  Once, 6 of 8 cycles Administration: 800 mg (01/02/2020), 800 mg (03/04/2020), 800 mg (01/16/2020), 800 mg (01/30/2020)  for chemotherapy treatment.     HISTORY OF PRESENTING ILLNESS:  Robin Daniels 64 y.o.  female borderline resectable pancreatic adeno ca currently on neoadjuvant chemotherapy with FOLFIRINOX an to review results of the restaging imaging  Patient is currently status post 5 cycles of chemotherapy  Patient continues to have difficulty swallowing/jaw pain after chemotherapy.  However symptoms currently resolved.  Denies any difficulty swallowing or pain  at this time.  Mild tingling and  numbness Days.  Otherwise no diarrhea.  No fevers or chills.  Patient is nervous during the surgery.    Review of Systems  Constitutional: Positive for weight loss. Negative for chills, diaphoresis, fever and malaise/fatigue.  HENT: Negative for nosebleeds and sore throat.   Eyes: Negative for double vision.  Respiratory: Negative for cough, hemoptysis, sputum production, shortness of breath and wheezing.   Cardiovascular: Negative for chest pain, palpitations, orthopnea and leg swelling.  Gastrointestinal: Negative for abdominal pain, blood in stool, constipation, diarrhea, heartburn, melena, nausea and vomiting.  Genitourinary: Negative for dysuria, frequency and urgency.  Musculoskeletal: Negative for back pain and joint pain.  Skin: Negative.  Negative for itching and rash.  Neurological: Negative for dizziness, tingling, focal weakness, weakness and headaches.  Endo/Heme/Allergies: Does not bruise/bleed easily.  Psychiatric/Behavioral: Negative for depression. The patient is not nervous/anxious and does not have insomnia.      MEDICAL HISTORY:  Past Medical History:  Diagnosis Date  . Anemia    history of  . Anxiety 09/11/2014  . Arthritis   . Cancer of head of pancreas ([place]) 12/04/2019  . Diabetes mellitus without complication ([place])   . Fluttering heart   . Hyperlipidemia   . Hypertension     SURGICAL HISTORY: Past Surgical History:  Procedure Laterality Date  . ENDOSCOPIC RETROGRADE CHOLANGIOPANCREATOGRAPHY (ERCP) WITH PROPOFOL N/A 11/16/2019   Procedure: ENDOSCOPIC RETROGRADE CHOLANGIOPANCREATOGRAPHY (ERCP) WITH PROPOFOL;  Surgeon: Lucilla Lame, MD;  Location: [facility] ENDOSCOPY;  Service: Endoscopy;  Laterality: N/A;  . EUS N/A 11/29/2019   Procedure: FULL UPPER ENDOSCOPIC ULTRASOUND (EUS) RADIAL;  Surgeon: Reita Cliche, MD;  Location: [facility] ENDOSCOPY;  Service: Gastroenterology;  Laterality: N/A;  . IR IMAGING GUIDED PORT INSERTION  12/14/2019  . right knee replacement Right  [id number]  . SHOULDER ARTHROSCOPY W/ ROTATOR CUFF REPAIR Right     SOCIAL HISTORY: Social History   Socioeconomic History  . Marital status: Divorced    Spouse name: Not on file  . Number of children: Not on file  . Years of education: Not on file  . Highest education level: Not on file  Occupational History  . Not on file  Tobacco Use  . Smoking status: Never Smoker  . Smokeless tobacco: Never Used  Vaping Use  . Vaping Use: Never used  Substance and Sexual Activity  . Alcohol use: No    Alcohol/week: 0.0 standard drinks  . Drug use: No  . Sexual activity: Never  Other Topics Concern  . Not on file  Social History Narrative   Lives in [place]; with mom and son; worked in dietary; never smoked; no alcohol.    Social Determinants of Health   Financial Resource Strain: Not on file  Food Insecurity: Not on file  Transportation Needs: Not on file  Physical Activity: Not on file  Stress: Not on file  Social Connections: Not on file  Intimate Partner Violence: Not on file    FAMILY HISTORY: Family History  Problem Relation Age of Onset  . Diabetes Mother   . Hyperlipidemia Mother   . Hypertension Mother   . Vision loss Mother   . Arthritis Father   . Hyperlipidemia Father   . Hypertension Father   . Breast cancer Maternal Aunt     ALLERGIES:  has No Known Allergies.  MEDICATIONS:  Current Outpatient Medications  Medication Sig Dispense Refill  . acetaminophen (TYLENOL) 650 MG CR tablet Take 1 tablet by mouth every 8 (  eight) hours as needed.    Marland Kitchen amLODipine (NORVASC) 10 MG tablet Take 10 mg by mouth daily.     Marland Kitchen lidocaine-prilocaine (EMLA) cream SMARTSIG:3 Topical 10 Times Daily PRN    . metoprolol succinate (TOPROL-XL) 50 MG 24 hr tablet Take 50 mg by mouth daily. Take with or immediately following a meal.     . vitamin B-12 (CYANOCOBALAMIN) 1000 MCG tablet Take 1 tablet (1,000 mcg total) by mouth daily.    . diphenoxylate-atropine (LOMOTIL) 2.5-0.025 MG  tablet Take 1 tablet by mouth 4 (four) times daily as needed for diarrhea or loose stools. Take it along with immodium (Patient not taking: No sig reported) 45 tablet 0  . Potassium Chloride ER 20 MEQ TBCR Take 20 mEq by mouth in the morning and at bedtime. 60 tablet 1   No current facility-administered medications for this visit.   Facility-Administered Medications Ordered in Other Visits  Medication Dose Route Frequency Provider Last Rate Last Admin  . atropine injection 0.5 mg  0.5 mg Intravenous Once Charlaine Dalton R, MD      . fluorouracil (ADRUCIL) 4,700 mg in sodium chloride 0.9 % 56 mL chemo infusion  2,400 mg/m2 (Treatment Plan Recorded) Intravenous 1 day or 1 dose Charlaine Dalton R, MD      . irinotecan (CAMPTOSAR) 300 mg in sodium chloride 0.9 % 500 mL chemo infusion  150 mg/m2 (Treatment Plan Recorded) Intravenous Once Cammie Sickle, MD 343 mL/hr at 03/18/20 1309 300 mg at 03/18/20 1309  . leucovorin 800 mg in sodium chloride 0.9 % 250 mL infusion  800 mg Intravenous Once Cammie Sickle, MD 193 mL/hr at 03/18/20 1311 800 mg at 03/18/20 1311  . sodium chloride flush (NS) 0.9 % injection 10 mL  10 mL Intravenous PRN Cammie Sickle, MD   10 mL at 03/18/20 0809      .  PHYSICAL EXAMINATION: ECOG PERFORMANCE STATUS: 1 - Symptomatic but completely ambulatory  Vitals:   03/18/20 0828 03/18/20 0915  BP: (!) 192/100 (!) 187/82  Pulse: 99   Resp: 18   Temp: (!) 97.4 F (36.3 C)    Filed Weights   03/18/20 0828  Weight: 194 lb (88 kg)    Physical Exam HENT:     Head: Normocephalic and atraumatic.     Mouth/Throat:     Pharynx: No oropharyngeal exudate.  Eyes:     Pupils: Pupils are equal, round, and reactive to light.  Cardiovascular:     Rate and Rhythm: Normal rate and regular rhythm.  Pulmonary:     Effort: Pulmonary effort is normal. No respiratory distress.     Breath sounds: Normal breath sounds. No wheezing.  Abdominal:     General:  Bowel sounds are normal. There is no distension.     Palpations: Abdomen is soft. There is no mass.     Tenderness: There is no abdominal tenderness. There is no guarding or rebound.  Musculoskeletal:        General: No tenderness. Normal range of motion.     Cervical back: Normal range of motion and neck supple.  Skin:    General: Skin is warm.  Neurological:     Mental Status: She is alert and oriented to person, place, and time.  Psychiatric:        Mood and Affect: Affect normal.      LABORATORY DATA:  I have reviewed the data as listed Lab Results  Component Value Date   WBC 5.5 03/18/2020  HGB 9.2 (L) 03/18/2020   HCT 28.5 (L) 03/18/2020   MCV 76.4 (L) 03/18/2020   PLT 197 03/18/2020   Recent Labs    11/21/19 1449 12/04/19 1327 12/19/19 0833 01/02/20 0831 02/13/20 0842 03/04/20 0851 03/18/20 0816  NA 133* 136 139   < > 137 136 136  K 4.2 3.6 3.2*   < > 3.2* 3.3* 3.0*  CL 91* 102 102   < > 102 100 104  CO2 30 26 26    < > 24 23 23   GLUCOSE 169* 126* 168*   < > 116* 118* 131*  BUN 15 7* 8   < > 8 9 8   CREATININE 1.02* 0.90 0.89   < > 0.96 0.80 0.89  CALCIUM 9.0 8.7* 8.9   < > 9.2 9.1 8.7*  GFRNONAA 57* >60 >60   < > >60 >60 >60  GFRAA >60 >60 >60  --   --   --   --   PROT 8.3* 7.3 7.6   < > 7.5 7.6 7.3  ALBUMIN 3.4* 3.5 3.5   < > 3.6 3.6 3.4*  AST 45* 19 35   < > 26 23 28   ALT 137* 22 50*   < > 22 16 17   ALKPHOS 405* 156* 294*   < > 111 109 113  BILITOT 1.8* 0.9 0.7   < > 0.4 0.5 0.4   < > = values in this interval not displayed.    RADIOGRAPHIC STUDIES: I have personally reviewed the radiological images as listed and agreed with the findings in the report. CT PANCREAS ABD W/WO  Result Date: 03/13/2020 CLINICAL DATA:  Pancreatic cancer, assess treatment response. No reported symptoms in this 64 year old female EXAM: CT ABDOMEN WITHOUT AND WITH CONTRAST TECHNIQUE: Multidetector CT imaging of the abdomen was performed following the standard protocol before  and following the bolus administration of intravenous contrast. CONTRAST:  126mL OMNIPAQUE IOHEXOL 300 MG/ML  SOLN COMPARISON:  PET exam from November 28, 2019 and CT of the abdomen from 12/11/2019 FINDINGS: Lower chest: Pneumobilia as on the previous study. Gallbladder less distended, some wall thickening perhaps partially due to under distension. Mild mural stratification of the gallbladder wall. Pancreas: Area of hypodensity in the pancreatic head-neck difficult to measure. Ductal caliber change and low attenuation around the distal common bile duct as on the previous study. Area measuring approximately 2.1 x 2.1 cm, (image 46, series 14) is not well-defined on the prior or on the current exam. 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 Difficult to definitively discern given inability to clearly visualize the known lesion in this location. No distortion of the portal vein or SMV. No involvement of the celiac or SMA. Classic hepatic arterial anatomy arising from the celiac axis. Mild pancreatic ductal dilation similar to the prior study. No acute pancreatic process. The area of low attenuation adjacent to the neck of the pancreas is markedly diminished. This showed fluid density previously and is essentially no longer visible, this was along the margin of the SMV on the prior study. Spleen: Spleen normal size without focal lesion. Adrenals/Urinary Tract: Adrenal glands are normal. Symmetric renal enhancement. No hydronephrosis. Stomach/Bowel: No acute gastrointestinal process. Gastrointestinal tract is unremarkable. The appendix is normal. Vascular/Lymphatic: Calcified atheromatous plaque of the abdominal aorta. No aneurysmal dilation. Small lymph nodes about the porta hepatis and pancreas show little change largest is a portacaval lymph node 11 mm and another small peripancreatic lymph node that is approximately 9 mm short axis (image  40, series 4). This peripancreatic lymph node near the head of the  pancreas was previously 7 mm short axis. Other: No ascites Musculoskeletal: No acute bone finding. No destructive bone process. IMPRESSION: 1. Pancreatic lesion with ill-defined margins appears similar accounting for very limited visualization of discrete lesion on the current scan. 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 No signs of arterial involvement. 2. Cystic area adjacent to the neck of the pancreas is nearly completely resolved. Perhaps a very small 9 mm focus at the site of a previously 2.5 x 1.5 cm area. 3. Small lymph nodes about the porta hepatis and pancreas show little change. Slightly increased size of a peripancreatic lymph node, this remains less than a cm. 4. Persistent biliary duct distension with biliary stent in situ. 5. Gallbladder with mural stratification and mucosal enhancement, less distended than on the prior study. This change likely relates to degree of distension and prior inflammation. Correlate with any symptoms that would suggest ongoing gallbladder pathology/cholecystitis. Reportedly the patient was asymptomatic at the time of scan. 6. Aortic atherosclerosis. These results will be called to the ordering clinician or representative by the Radiologist Assistant, and communication documented in the PACS or [employer]. Aortic Atherosclerosis (ICD10-I70.0). Electronically Signed   By: Zetta Bills M.D.   On: 03/13/2020 16:56    ASSESSMENT & PLAN:   Cancer of head of pancreas ([place]) #  Pancreas adenocarcinoma-Stage IB-/boderline resectable ;Given the abutment of the SMV;  neoadjuvant chemotherapy- on FOLFIRINX. S/p 5 cycles of chemo-CT  12/17-ill-defined pancreatic lesion similar size; possible mild abutment of the portal vein/proximal SMV.  Significant improvement of the cystic lesion adjacent to the neck of pancreas [currently 9 mm previously 2.5 cm]; slight increase in size of the peripancreatic lymph node [<cm in size];  gallbladder changes- see below.  Tumor marker normalized.  Discussed with Dr. Barry Dienes the above findings on the imaging.   # proceed with cycle #6  Continue FOLFIRINOX Labs today reviewed;  acceptable for treatment.CA 19-9 improving.  # Mucositis- sec chemotherapy-grade 1 through 2.  Continue salt and baking (.  # Severe Hypokalemia-today potassium is 3.0. Continue Kdur 20 BID.  # Myalgias/Jaw discomfort-stable.? oxaliplatin-Irinotecan AEs; plan pre-and post atropine.   # mediport malfunction: flushing; not drawing; NOV- dye study shows a fibrin sheath.     # Elevated BP-systolic 932I.  Otherwise clinically stable.  Recommend patient take her blood pressure medications daily.  Okay to proceed with chemotherapy.  # DISPOSITION:  # FOLFIRONOX today;  pump off d-3/udenyca # in 2 weeks-X-MD; labs- cbc.cmp;ca-19-9; possible KCL over 1 hour; FOLFIRINOX; d-3 pump-OFF;udenyca;Dr.B   All questions were answered. The patient knows to call the clinic with any problems, questions or concerns.    Cammie Sickle, MD 03/18/2020 1:17 PM

## 2020-03-18 NOTE — Assessment & Plan Note (Addendum)
#    Pancreas adenocarcinoma-Stage IB-/boderline resectable ;Given the abutment of the SMV;  neoadjuvant chemotherapy- on FOLFIRINX. S/p 5 cycles of chemo-CT  12/17-ill-defined pancreatic lesion similar size; possible mild abutment of the portal vein/proximal SMV.  Significant improvement of the cystic lesion adjacent to the neck of pancreas [currently 9 mm previously 2.5 cm]; slight increase in size of the peripancreatic lymph node [<cm in size]; gallbladder changes- see below.  Tumor marker normalized.  Discussed with Dr. Barry Dienes the above findings on the imaging.   # proceed with cycle #6  Continue FOLFIRINOX Labs today reviewed;  acceptable for treatment.CA 19-9 improving.  # Mucositis- sec chemotherapy-grade 1 through 2.  Continue salt and baking (.  # Severe Hypokalemia-today potassium is 3.0. Continue Kdur 20 BID.  # Myalgias/Jaw discomfort-stable.? oxaliplatin-Irinotecan AEs; plan pre-and post atropine.   # mediport malfunction: flushing; not drawing; NOV- dye study shows a fibrin sheath.     # Elevated BP-systolic 712W.  Otherwise clinically stable.  Recommend patient take her blood pressure medications daily.  Okay to proceed with chemotherapy.  # DISPOSITION:  # FOLFIRONOX today;  pump off d-3/udenyca # in 2 weeks-X-MD; labs- cbc.cmp;ca-19-9; possible KCL over 1 hour; FOLFIRINOX; d-3 pump-OFF;udenyca;Dr.B

## 2020-03-19 LAB — CANCER ANTIGEN 19-9: CA 19-9: 20 U/mL (ref 0–35)

## 2020-03-20 ENCOUNTER — Inpatient Hospital Stay: Payer: Medicare Other

## 2020-03-20 VITALS — BP 179/90 | HR 92 | Temp 97.1°F | Resp 18

## 2020-03-20 DIAGNOSIS — C250 Malignant neoplasm of head of pancreas: Secondary | ICD-10-CM

## 2020-03-20 DIAGNOSIS — Z5111 Encounter for antineoplastic chemotherapy: Secondary | ICD-10-CM | POA: Diagnosis not present

## 2020-03-20 MED ORDER — HEPARIN SOD (PORK) LOCK FLUSH 100 UNIT/ML IV SOLN
INTRAVENOUS | Status: AC
  Filled 2020-03-20: qty 5

## 2020-03-20 MED ORDER — HEPARIN SOD (PORK) LOCK FLUSH 100 UNIT/ML IV SOLN
500.0000 [IU] | Freq: Once | INTRAVENOUS | Status: AC | PRN
  Administered 2020-03-20: 500 [IU]
  Filled 2020-03-20: qty 5

## 2020-03-20 MED ORDER — PEGFILGRASTIM-CBQV 6 MG/0.6ML ~~LOC~~ SOSY
6.0000 mg | PREFILLED_SYRINGE | Freq: Once | SUBCUTANEOUS | Status: AC
  Administered 2020-03-20: 6 mg via SUBCUTANEOUS
  Filled 2020-03-20: qty 0.6

## 2020-03-20 MED ORDER — SODIUM CHLORIDE 0.9% FLUSH
10.0000 mL | INTRAVENOUS | Status: DC | PRN
  Administered 2020-03-20: 10 mL
  Filled 2020-03-20: qty 10

## 2020-03-20 NOTE — Progress Notes (Signed)
Pt stable at discharge.  

## 2020-03-25 ENCOUNTER — Other Ambulatory Visit: Payer: Self-pay

## 2020-03-25 ENCOUNTER — Other Ambulatory Visit
Admission: RE | Admit: 2020-03-25 | Discharge: 2020-03-25 | Disposition: A | Discharge status: Home / Self Care | Payer: Medicare Other | Source: Ambulatory Visit | Attending: Gastroenterology | Admitting: Gastroenterology

## 2020-03-25 DIAGNOSIS — Z20822 Contact with and (suspected) exposure to covid-19: Secondary | ICD-10-CM | POA: Diagnosis not present

## 2020-03-25 DIAGNOSIS — Z01812 Encounter for preprocedural laboratory examination: Secondary | ICD-10-CM | POA: Insufficient documentation

## 2020-03-26 LAB — SARS CORONAVIRUS 2 (TAT 6-24 HRS): SARS Coronavirus 2: NEGATIVE

## 2020-03-27 ENCOUNTER — Ambulatory Visit
Admission: RE | Admit: 2020-03-27 | Discharge: 2020-03-27 | Disposition: A | Discharge status: Home / Self Care | Payer: Medicare Other | Attending: Gastroenterology | Admitting: Gastroenterology

## 2020-03-27 ENCOUNTER — Ambulatory Visit: Payer: Medicare Other

## 2020-03-27 ENCOUNTER — Ambulatory Visit: Payer: Medicare Other | Admitting: Anesthesiology

## 2020-03-27 ENCOUNTER — Encounter
Admission: RE | Disposition: A | Discharge status: Home / Self Care | Payer: Self-pay | Source: Home / Self Care | Attending: Gastroenterology

## 2020-03-27 ENCOUNTER — Other Ambulatory Visit: Payer: Self-pay

## 2020-03-27 ENCOUNTER — Encounter: Payer: Self-pay | Admitting: Gastroenterology

## 2020-03-27 DIAGNOSIS — Z8261 Family history of arthritis: Secondary | ICD-10-CM | POA: Diagnosis not present

## 2020-03-27 DIAGNOSIS — E119 Type 2 diabetes mellitus without complications: Secondary | ICD-10-CM | POA: Diagnosis not present

## 2020-03-27 DIAGNOSIS — Z8349 Family history of other endocrine, nutritional and metabolic diseases: Secondary | ICD-10-CM | POA: Insufficient documentation

## 2020-03-27 DIAGNOSIS — Z8249 Family history of ischemic heart disease and other diseases of the circulatory system: Secondary | ICD-10-CM | POA: Insufficient documentation

## 2020-03-27 DIAGNOSIS — C250 Malignant neoplasm of head of pancreas: Secondary | ICD-10-CM | POA: Diagnosis not present

## 2020-03-27 DIAGNOSIS — I10 Essential (primary) hypertension: Secondary | ICD-10-CM | POA: Diagnosis not present

## 2020-03-27 DIAGNOSIS — Z833 Family history of diabetes mellitus: Secondary | ICD-10-CM | POA: Diagnosis not present

## 2020-03-27 DIAGNOSIS — Z79899 Other long term (current) drug therapy: Secondary | ICD-10-CM | POA: Insufficient documentation

## 2020-03-27 DIAGNOSIS — Z803 Family history of malignant neoplasm of breast: Secondary | ICD-10-CM | POA: Insufficient documentation

## 2020-03-27 DIAGNOSIS — K831 Obstruction of bile duct: Secondary | ICD-10-CM | POA: Diagnosis not present

## 2020-03-27 DIAGNOSIS — Z820 Family history of epilepsy and other diseases of the nervous system: Secondary | ICD-10-CM | POA: Diagnosis not present

## 2020-03-27 HISTORY — PX: ERCP: SHX5425

## 2020-03-27 SURGERY — ERCP, WITH INTERVENTION IF INDICATED
Anesthesia: General

## 2020-03-27 MED ORDER — PROPOFOL 10 MG/ML IV BOLUS
INTRAVENOUS | Status: AC
  Filled 2020-03-27: qty 20

## 2020-03-27 MED ORDER — ONDANSETRON HCL 4 MG/2ML IJ SOLN
INTRAMUSCULAR | Status: AC
  Filled 2020-03-27: qty 2

## 2020-03-27 MED ORDER — LACTATED RINGERS IV SOLN: INTRAVENOUS | Status: DC

## 2020-03-27 MED ORDER — LIDOCAINE HCL (CARDIAC) PF 100 MG/5ML IV SOSY
PREFILLED_SYRINGE | INTRAVENOUS | Status: DC | PRN
  Administered 2020-03-27: 40 mg via INTRAVENOUS

## 2020-03-27 MED ORDER — PROPOFOL 500 MG/50ML IV EMUL
INTRAVENOUS | Status: DC | PRN
  Administered 2020-03-27: 150 ug/kg/min via INTRAVENOUS

## 2020-03-27 MED ORDER — LIDOCAINE HCL (PF) 2 % IJ SOLN
INTRAMUSCULAR | Status: AC
  Filled 2020-03-27: qty 5

## 2020-03-27 MED ORDER — PROPOFOL 500 MG/50ML IV EMUL
INTRAVENOUS | Status: AC
  Filled 2020-03-27: qty 50

## 2020-03-27 NOTE — Anesthesia Preprocedure Evaluation (Signed)
Anesthesia Evaluation  Patient identified by MRN, date of birth, ID band Patient awake    Reviewed: Allergy & Precautions, H&P , NPO status , Patient's Chart, lab work & pertinent test results, reviewed documented beta blocker date and time   Airway Mallampati: II   Neck ROM: full    Dental  (+) Poor Dentition   Pulmonary neg pulmonary ROS,    Pulmonary exam normal        Cardiovascular Exercise Tolerance: Poor hypertension, On Medications negative cardio ROS Normal cardiovascular exam Rhythm:regular Rate:Normal     Neuro/Psych  Headaches, Anxiety negative psych ROS   GI/Hepatic negative GI ROS, Neg liver ROS,   Endo/Other  negative endocrine ROSdiabetes  Renal/GU negative Renal ROS  negative genitourinary   Musculoskeletal   Abdominal   Peds  Hematology  (+) Blood dyscrasia, anemia ,   Anesthesia Other Findings Past Medical History: No date: Anemia     Comment:  history of 09/11/2014: Anxiety No date: Arthritis 12/04/2019: Cancer of head of pancreas ([facility]) No date: Diabetes mellitus without complication ([facility]) No date: Fluttering heart No date: Hyperlipidemia No date: Hypertension Past Surgical History: 11/16/2019: ENDOSCOPIC RETROGRADE CHOLANGIOPANCREATOGRAPHY (ERCP) WITH  PROPOFOL; N/A     Comment:  Procedure: ENDOSCOPIC RETROGRADE               CHOLANGIOPANCREATOGRAPHY (ERCP) WITH PROPOFOL;  Surgeon:               Midge Minium, MD;  Location: [facility] ENDOSCOPY;  Service:               Endoscopy;  Laterality: N/A; 11/29/2019: EUS; N/A     Comment:  Procedure: FULL UPPER ENDOSCOPIC ULTRASOUND (EUS)               RADIAL;  Surgeon: Doren Custard, MD;  Location: [facility]               ENDOSCOPY;  Service: Gastroenterology;  Laterality: N/A; 12/14/2019: IR IMAGING GUIDED PORT INSERTION [id number]: right knee replacement; Right No date: SHOULDER ARTHROSCOPY W/ ROTATOR CUFF REPAIR; Right BMI    Body Mass Index: 34.37  kg/m     Reproductive/Obstetrics negative OB ROS                             Anesthesia Physical Anesthesia Plan  ASA: III  Anesthesia Plan: General   Post-op Pain Management:    Induction:   PONV Risk Score and Plan:   Airway Management Planned:   Additional Equipment:   Intra-op Plan:   Post-operative Plan:   Informed Consent: I have reviewed the patients History and Physical, chart, labs and discussed the procedure including the risks, benefits and alternatives for the proposed anesthesia with the patient or authorized representative who has indicated his/her understanding and acceptance.     Dental Advisory Given  Plan Discussed with: CRNA  Anesthesia Plan Comments:         Anesthesia Quick Evaluation

## 2020-03-27 NOTE — OR Nursing (Signed)
WallFlex Biliary Rx Fully Covered Stent 44mm x 37mm Lot #: [id number]; Ref Catalog No: [id number]; GTIN: [id number]

## 2020-03-27 NOTE — Transfer of Care (Signed)
Immediate Anesthesia Transfer of Care Note  Patient: Robin Daniels  Procedure(s) Performed: ENDOSCOPIC RETROGRADE CHOLANGIOPANCREATOGRAPHY (ERCP) (N/A )  Patient Location: PACU  Anesthesia Type:General  Level of Consciousness: awake and sedated  Airway & Oxygen Therapy: Patient Spontanous Breathing and Patient connected to nasal cannula oxygen  Post-op Assessment: Post -op Vital signs reviewed and stable  Post vital signs: Reviewed and stable  Last Vitals:  Vitals Value Taken Time  BP    Temp    Pulse    Resp    SpO2      Last Pain:  Vitals:   03/27/20 0842  TempSrc: Temporal  PainSc: 0-No pain         Complications: No complications documented.

## 2020-03-27 NOTE — H&P (Signed)
Midge Minium, MD [facility] [address], [address] [place], [place] [address] [phone] Fax : [phone]  Primary Care Physician:  [facility] Primary Gastroenterologist:  Dr. Servando Snare  Pre-Procedure History & Physical: HPI:  Robin Daniels is a 64 y.o. female is here for an ERCP.   Past Medical History:  Diagnosis Date  . Anemia    history of  . Anxiety 09/11/2014  . Arthritis   . 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 Diabetes mellitus without complication (HCC)   . Fluttering heart   . Hyperlipidemia   . Hypertension     Past Surgical History:  Procedure Laterality Date  . ENDOSCOPIC RETROGRADE CHOLANGIOPANCREATOGRAPHY (ERCP) WITH PROPOFOL N/A 11/16/2019   Procedure: ENDOSCOPIC RETROGRADE CHOLANGIOPANCREATOGRAPHY (ERCP) WITH PROPOFOL;  Surgeon: Midge Minium, MD;  Location: [facility] ENDOSCOPY;  Service: Endoscopy;  Laterality: N/A;  . EUS N/A 11/29/2019   Procedure: FULL UPPER ENDOSCOPIC ULTRASOUND (EUS) RADIAL;  Surgeon: Doren Custard, MD;  Location: [facility] ENDOSCOPY;  Service: Gastroenterology;  Laterality: N/A;  . IR IMAGING GUIDED PORT INSERTION  12/14/2019  . right knee replacement Right [id number]  . SHOULDER ARTHROSCOPY W/ ROTATOR CUFF REPAIR Right     Prior to Admission medications   Medication Sig Start Date End Date Taking? Authorizing Provider  Potassium Chloride ER 20 MEQ TBCR Take 20 mEq by mouth in the morning and at bedtime. 03/18/20  Yes Earna Coder, MD  vitamin B-12 (CYANOCOBALAMIN) 1000 MCG tablet Take 1 tablet (1,000 mcg total) by mouth daily. 11/17/19  Yes Lurene Shadow, MD  acetaminophen (TYLENOL) 650 MG CR tablet Take 1 tablet by mouth every 8 (eight) hours as needed. 09/05/19   [provider]  amLODipine (NORVASC) 10 MG tablet Take 10 mg by mouth daily.  11/21/19   [provider]  diphenoxylate-atropine (LOMOTIL) 2.5-0.025 MG tablet Take 1 tablet by mouth 4 (four) times daily as needed for diarrhea or loose  stools. Take it along with immodium Patient not taking: No sig reported 01/02/20   Earna Coder, MD  lidocaine-prilocaine (EMLA) cream SMARTSIG:3 Topical 10 Times Daily PRN 01/23/20   [provider]  metoprolol succinate (TOPROL-XL) 50 MG 24 hr tablet Take 50 mg by mouth daily. Take with or immediately following a meal.  Patient not taking: Reported on 03/27/2020    [provider]  prochlorperazine (COMPAZINE) 10 MG tablet Take 1 tablet (10 mg total) by mouth every 6 (six) hours as needed (Nausea or vomiting). 12/17/19 01/01/20  Earna Coder, MD    Allergies as of 03/14/2020  . (No Known Allergies)    Family History  Problem Relation Age of Onset  . Diabetes Mother   . Hyperlipidemia Mother   . Hypertension Mother   . Vision loss Mother   . Arthritis Father   . Hyperlipidemia Father   . Hypertension Father   . Breast cancer Maternal Aunt     Social History   Socioeconomic History  . Marital status: Divorced    Spouse name: Not on file  . Number of children: Not on file  . Years of education: Not on file  . Highest education level: Not on file  Occupational History  . Not on file  Tobacco Use  . Smoking status: Never Smoker  . Smokeless tobacco: Never Used  Vaping Use  . Vaping Use: Never used  Substance and Sexual Activity  . Alcohol use: No    Alcohol/week: 0.0 standard drinks  . Drug use:  No  . Sexual activity: Never  Other Topics Concern  . Not on file  Social History Narrative   Lives in [place]; with mom and son; worked in dietary; never smoked; no alcohol.    Social Determinants of Health   Financial Resource Strain: Not on file  Food Insecurity: Not on file  Transportation Needs: Not on file  Physical Activity: Not on file  Stress: Not on file  Social Connections: Not on file  Intimate Partner Violence: Not on file    Review of Systems: See HPI, otherwise negative ROS  Physical Exam: BP (!) 187/97   Pulse  (!) 101   Temp 97.7 F (36.5 C) (Temporal)   Resp 16   Ht 5\' 3"  (1.6 m)   Wt 88 kg   LMP  (LMP Unknown)   SpO2 100%   BMI 34.37 kg/m  General:   Alert,  pleasant and cooperative in NAD Head:  Normocephalic and atraumatic. Neck:  Supple; no masses or thyromegaly. Lungs:  Clear throughout to auscultation.    Heart:  Regular rate and rhythm. Abdomen:  Soft, nontender and nondistended. Normal bowel sounds, without guarding, and without rebound.   Neurologic:  Alert and  oriented x4;  grossly normal neurologically.  Impression/Plan: Robin Daniels is here for an ERCP to be performed for pancreatic malignant tumor  Risks, benefits, limitations, and alternatives regarding  ERCP have been reviewed with the patient.  Questions have been answered.  All parties agreeable.   [name], MD  03/27/2020, 9:32 AM

## 2020-03-27 NOTE — Progress Notes (Signed)
   03/27/20 1103  Vitals  BP (!) 199/95   B/P at baseline, patient did not take her bp medication, notified patient to take her blood pressure medication once get home.

## 2020-03-27 NOTE — Anesthesia Procedure Notes (Signed)
Performed by: Cook-Martin, Smantha Boakye Pre-anesthesia Checklist: Patient identified, Emergency Drugs available, Suction available, Patient being monitored and Timeout performed Patient Re-evaluated:Patient Re-evaluated prior to induction Oxygen Delivery Method: Nasal cannula Preoxygenation: Pre-oxygenation with 100% oxygen Induction Type: IV induction Airway Equipment and Method: Bite block Placement Confirmation: positive ETCO2 and CO2 detector       

## 2020-03-27 NOTE — Op Note (Signed)
[facility] Gastroenterology Patient Name: Robin Daniels Procedure Date: 03/27/2020 9:44 AM MRN: [id number] Account #: [id number] Date of Birth: 1955/09/24 Admit Type: Outpatient Age: 64 Room: [facility] ENDO ROOM 4 Gender: Female Note Status: Finalized Procedure:             ERCP Indications:           Malignant tumor of the head of pancreas Providers:             Midge Minium MD, MD Referring MD:          Alinda Dooms Medicines:             Propofol per Anesthesia Complications:         No immediate complications. Procedure:             Pre-Anesthesia Assessment:                        - Prior to the procedure, a History and Physical was                         performed, and patient medications and allergies were                         reviewed. The patient's tolerance of previous                         anesthesia was also reviewed. The risks and benefits                         of the procedure and the sedation options and risks                         were discussed with the patient. All questions were                         answered, and informed consent was obtained. Prior                         Anticoagulants: The patient has taken no previous                         anticoagulant or antiplatelet agents. ASA Grade                         Assessment: II - A patient with mild systemic disease.                         After reviewing the risks and benefits, the patient                         was deemed in satisfactory condition to undergo the                         procedure.                        After obtaining informed consent, the scope was passed  under direct vision. Throughout the procedure, the                         patient's blood pressure, pulse, and oxygen                         saturations were monitored continuously. The                         Duodenoscope was introduced through the mouth, and                          used to inject contrast into and used to inject                         contrast into the bile duct. The ERCP was accomplished                         without difficulty. The patient tolerated the                         procedure well. Findings:      A biliary stent was visible on the scout film. One stent was removed       from the common bile duct using a snare. A wire was passed into the       biliary tree. The bile duct was deeply cannulated with the short-nosed       traction sphincterotome. Contrast was injected. I personally interpreted       the bile duct images. There was brisk flow of contrast through the       ducts. Image quality was excellent. Contrast extended to the entire       biliary tree. The lower third of the main bile duct contained a single       severe segmental stenosis 4 mm in length. The biliary tree was swept       with a 15 mm balloon starting at the bifurcation. Sludge was swept from       the duct. One 10 Fr by 6 cm covered metal stent was placed into the       common bile duct. Bile flowed through the stent. The stent was in good       position. 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 The                         stricture was malignant appearing.                        - One stent was removed from the common bile duct.                        - The biliary tree was swept and sludge was found.                        -  One covered metal stent was placed into the common                         bile duct. Recommendation:        - Resume previous diet.                        - Discharge patient to home.                        - Continue present medications. Procedure Code(s):     --- Professional ---                        [phone], Endoscopic retrograde cholangiopancreatography                         (ERCP); with removal and exchange of stent(s), biliary                          or pancreatic duct, including pre- and post-dilation                         and guide wire passage, when performed, including                         sphincterotomy, when performed, each stent exchanged                        [id number], Endoscopic retrograde cholangiopancreatography                         (ERCP); with removal of calculi/debris from                         biliary/pancreatic duct(s) Diagnosis Code(s):     --- Professional ---                        C25.0, Malignant neoplasm of head of pancreas                        Z46.59, Encounter for fitting and adjustment of other                         gastrointestinal appliance and device CPT copyright 2019 [employer]. All rights reserved. The codes documented in this report are preliminary and upon coder review may  be revised to meet current compliance requirements. Lucilla Lame MD, MD 03/27/2020 10:34:00 AM This report has been signed electronically. Number of Addenda: 0 Note Initiated On: 03/27/2020 9:44 AM Estimated Blood Loss:  Estimated blood loss: none. Estimated blood loss: none.      [facility]

## 2020-04-01 ENCOUNTER — Inpatient Hospital Stay (HOSPITAL_BASED_OUTPATIENT_CLINIC_OR_DEPARTMENT_OTHER): Payer: Medicare Other | Admitting: Oncology

## 2020-04-01 ENCOUNTER — Inpatient Hospital Stay: Payer: Medicare Other

## 2020-04-01 ENCOUNTER — Encounter: Payer: Self-pay | Admitting: Oncology

## 2020-04-01 ENCOUNTER — Inpatient Hospital Stay: Payer: Medicare Other | Attending: Oncology

## 2020-04-01 VITALS — BP 181/92 | HR 93 | Temp 98.2°F | Resp 18 | Wt 189.0 lb

## 2020-04-01 VITALS — BP 175/82 | HR 79 | Resp 16

## 2020-04-01 DIAGNOSIS — D6481 Anemia due to antineoplastic chemotherapy: Secondary | ICD-10-CM | POA: Insufficient documentation

## 2020-04-01 DIAGNOSIS — Z5189 Encounter for other specified aftercare: Secondary | ICD-10-CM | POA: Diagnosis not present

## 2020-04-01 DIAGNOSIS — E876 Hypokalemia: Secondary | ICD-10-CM | POA: Insufficient documentation

## 2020-04-01 DIAGNOSIS — Z452 Encounter for adjustment and management of vascular access device: Secondary | ICD-10-CM | POA: Diagnosis not present

## 2020-04-01 DIAGNOSIS — K1230 Oral mucositis (ulcerative), unspecified: Secondary | ICD-10-CM | POA: Insufficient documentation

## 2020-04-01 DIAGNOSIS — T451X5A Adverse effect of antineoplastic and immunosuppressive drugs, initial encounter: Secondary | ICD-10-CM

## 2020-04-01 DIAGNOSIS — C250 Malignant neoplasm of head of pancreas: Secondary | ICD-10-CM | POA: Diagnosis not present

## 2020-04-01 DIAGNOSIS — Z5111 Encounter for antineoplastic chemotherapy: Secondary | ICD-10-CM | POA: Insufficient documentation

## 2020-04-01 DIAGNOSIS — I10 Essential (primary) hypertension: Secondary | ICD-10-CM | POA: Diagnosis not present

## 2020-04-01 LAB — CBC WITH DIFFERENTIAL/PLATELET
Abs Immature Granulocytes: 0.22 10*3/uL — ABNORMAL HIGH (ref 0.00–0.07)
Basophils Absolute: 0 10*3/uL (ref 0.0–0.1)
Basophils Relative: 0 %
Eosinophils Absolute: 0.1 10*3/uL (ref 0.0–0.5)
Eosinophils Relative: 1 %
HCT: 29 % — ABNORMAL LOW (ref 36.0–46.0)
Hemoglobin: 9.2 g/dL — ABNORMAL LOW (ref 12.0–15.0)
Immature Granulocytes: 5 %
Lymphocytes Relative: 28 %
Lymphs Abs: 1.4 10*3/uL (ref 0.7–4.0)
MCH: 24.5 pg — ABNORMAL LOW (ref 26.0–34.0)
MCHC: 31.7 g/dL (ref 30.0–36.0)
MCV: 77.3 fL — ABNORMAL LOW (ref 80.0–100.0)
Monocytes Absolute: 0.4 10*3/uL (ref 0.1–1.0)
Monocytes Relative: 9 %
Neutro Abs: 2.8 10*3/uL (ref 1.7–7.7)
Neutrophils Relative %: 57 %
Platelets: 224 10*3/uL (ref 150–400)
RBC: 3.75 MIL/uL — ABNORMAL LOW (ref 3.87–5.11)
RDW: 18.6 % — ABNORMAL HIGH (ref 11.5–15.5)
WBC: 4.9 10*3/uL (ref 4.0–10.5)
nRBC: 0 % (ref 0.0–0.2)

## 2020-04-01 LAB — COMPREHENSIVE METABOLIC PANEL
ALT: 29 U/L (ref 0–44)
AST: 30 U/L (ref 15–41)
Albumin: 3.4 g/dL — ABNORMAL LOW (ref 3.5–5.0)
Alkaline Phosphatase: 157 U/L — ABNORMAL HIGH (ref 38–126)
Anion gap: 10 (ref 5–15)
BUN: 6 mg/dL — ABNORMAL LOW (ref 8–23)
CO2: 26 mmol/L (ref 22–32)
Calcium: 9 mg/dL (ref 8.9–10.3)
Chloride: 100 mmol/L (ref 98–111)
Creatinine, Ser: 0.83 mg/dL (ref 0.44–1.00)
GFR, Estimated: >60 mL/min (ref >60)
Glucose, Bld: 139 mg/dL — ABNORMAL HIGH (ref 70–99)
Potassium: 3.1 mmol/L — ABNORMAL LOW (ref 3.5–5.1)
Sodium: 136 mmol/L (ref 135–145)
Total Bilirubin: 0.3 mg/dL (ref 0.3–1.2)
Total Protein: 7.4 g/dL (ref 6.5–8.1)

## 2020-04-01 MED ORDER — SODIUM CHLORIDE 0.9 % IV SOLN
10.0000 mg | Freq: Once | INTRAVENOUS | Status: AC
  Administered 2020-04-01: 10 mg via INTRAVENOUS
  Filled 2020-04-01: qty 1

## 2020-04-01 MED ORDER — SODIUM CHLORIDE 0.9 % IV SOLN
150.0000 mg/m2 | Freq: Once | INTRAVENOUS | Status: AC
  Administered 2020-04-01: 300 mg via INTRAVENOUS
  Filled 2020-04-01: qty 15

## 2020-04-01 MED ORDER — OXALIPLATIN CHEMO INJECTION 100 MG/20ML
85.0000 mg/m2 | Freq: Once | INTRAVENOUS | Status: AC
  Administered 2020-04-01: 165 mg via INTRAVENOUS
  Filled 2020-04-01: qty 33

## 2020-04-01 MED ORDER — FOSAPREPITANT DIMEGLUMINE INJECTION 150 MG
150.0000 mg | Freq: Once | INTRAVENOUS | Status: AC
  Administered 2020-04-01: 150 mg via INTRAVENOUS
  Filled 2020-04-01: qty 150

## 2020-04-01 MED ORDER — POTASSIUM CHLORIDE IN NACL 20-0.9 MEQ/L-% IV SOLN
Freq: Once | INTRAVENOUS | Status: AC
  Filled 2020-04-01: qty 1000

## 2020-04-01 MED ORDER — DEXTROSE 5 % IV SOLN
Freq: Once | INTRAVENOUS | Status: AC
Start: 2020-04-01 — End: 2020-04-01
  Filled 2020-04-01: qty 250

## 2020-04-01 MED ORDER — SODIUM CHLORIDE 0.9% FLUSH
10.0000 mL | Freq: Once | INTRAVENOUS | Status: AC
  Administered 2020-04-01: 10 mL via INTRAVENOUS
  Filled 2020-04-01: qty 10

## 2020-04-01 MED ORDER — PALONOSETRON HCL INJECTION 0.25 MG/5ML
0.2500 mg | Freq: Once | INTRAVENOUS | Status: AC
  Administered 2020-04-01: 0.25 mg via INTRAVENOUS
  Filled 2020-04-01: qty 5

## 2020-04-01 MED ORDER — HEPARIN SOD (PORK) LOCK FLUSH 100 UNIT/ML IV SOLN
500.0000 [IU] | Freq: Once | INTRAVENOUS | Status: DC
  Filled 2020-04-01: qty 5

## 2020-04-01 MED ORDER — ATROPINE SULFATE 1 MG/ML IJ SOLN
0.5000 mg | Freq: Once | INTRAMUSCULAR | Status: AC
  Administered 2020-04-01: 0.5 mg via INTRAVENOUS

## 2020-04-01 MED ORDER — SODIUM CHLORIDE 0.9 % IV SOLN
408.0000 mg/m2 | Freq: Once | INTRAVENOUS | Status: AC
  Administered 2020-04-01: 800 mg via INTRAVENOUS
  Filled 2020-04-01: qty 35

## 2020-04-01 MED ORDER — SODIUM CHLORIDE 0.9 % IV SOLN
2400.0000 mg/m2 | INTRAVENOUS | Status: DC
  Administered 2020-04-01: 4700 mg via INTRAVENOUS
  Filled 2020-04-01: qty 94

## 2020-04-01 MED ORDER — SODIUM CHLORIDE 0.9 % IV SOLN
Freq: Once | INTRAVENOUS | Status: AC
Start: 2020-04-01 — End: 2020-04-01
  Filled 2020-04-01: qty 250

## 2020-04-01 MED ORDER — ATROPINE SULFATE 1 MG/ML IJ SOLN
0.5000 mg | Freq: Once | INTRAMUSCULAR | Status: AC
Start: 2020-04-01 — End: 2020-04-01
  Administered 2020-04-01: 0.5 mg via INTRAVENOUS
  Filled 2020-04-01: qty 1

## 2020-04-01 NOTE — Progress Notes (Signed)
Patient reports during the last medication given during treatment and pump she has eye twitches, leg pain/cramps, thumbs "locking up" (Dr. B is aware).  Did not take her medications last night so the bp is elevated.

## 2020-04-01 NOTE — Progress Notes (Signed)
Initial BP 177/102, recheck 181/82 per Lanora Manis, RN. Okay to proceed with treatment and 20 mEq Potassium per Lanora Manis, Charity fundraiser.  Patient tolerated infusion well. Discharged home.

## 2020-04-01 NOTE — Progress Notes (Signed)
[facility] CONSULT NOTE  Patient Care Team: [facility] as PCP - General [name], [place], FNP (Family Medicine) Jim Like, RN as Registered Nurse Scarlett Presto, RN as Registered Nurse Benita Gutter, RN as Oncology Nurse Navigator  CHIEF COMPLAINTS/PURPOSE OF CONSULTATION: pancreas adenocarcinoma   Oncology History Overview Note  #Pancreas adenocarcinoma-Stage IB- [id number]-[EUS- Dr.Spaete; [facility]/GI; mass 22x47mm; invading/abutting superior mesenteric vein; 2 enlarged lymph nodes peripancreatic/porta hepatis largest 10 x 5.8 mm-nonpathologic based on EUS criteria/no biopsy]-borderline resectable.  PET scan no evidence of distant metastatic disease.  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 Fredirick Maudlin September]  # 01/02/2020-FOLFIRINOX; Neulasta.  # Borderline DM; HTN   # SURVIVORSHIP:   # GENETICS:   DIAGNOSIS: Pancreatic cancer  STAGE:  IB/borderline resectable;  GOALS: cure  CURRENT/MOST RECENT THERAPY : Neoadjuvant-FOLFIRINOX    Cancer of head of pancreas (HCC)  12/04/2019 Initial Diagnosis   Cancer of head of pancreas (HCC)   12/19/2019 - 12/19/2019 Chemotherapy   The patient had PACLitaxel-protein bound (ABRAXANE) chemo infusion 250 mg, 125 mg/m2 = 250 mg, Intravenous,  Once, 1 of 4 cycles Administration: 250 mg (12/19/2019) gemcitabine (GEMZAR) 2,000 mg in sodium chloride 0.9 % 250 mL chemo infusion, 1,976 mg, Intravenous,  Once, 1 of 4 cycles Administration: 2,000 mg (12/19/2019)  for chemotherapy treatment.    01/02/2020 -  Chemotherapy   The patient had dexamethasone (DECADRON) 4 MG tablet, 8 mg, Oral, Daily, 1 of 1 cycle, Start date: --, End date: -- palonosetron (ALOXI) injection 0.25 mg, 0.25 mg, Intravenous,  Once, 7 of 8 cycles Administration: 0.25 mg (01/02/2020), 0.25 mg (03/04/2020), 0.25  mg (03/18/2020), 0.25 mg (01/16/2020), 0.25 mg (01/30/2020), 0.25 mg (02/15/2020) pegfilgrastim-cbqv (UDENYCA) injection 6 mg, 6 mg, Subcutaneous, Once, 6 of 6 cycles Administration: 6 mg (01/04/2020), 6 mg (01/18/2020), 6 mg (03/06/2020), 6 mg (03/20/2020), 6 mg (02/01/2020), 6 mg (02/18/2020) pegfilgrastim-bmez (ZIEXTENZO) injection 6 mg, 6 mg, Subcutaneous,  Once, 1 of 2 cycles irinotecan (CAMPTOSAR) 300 mg in sodium chloride 0.9 % 500 mL chemo infusion, 150 mg/m2 = 300 mg, Intravenous,  Once, 7 of 8 cycles Administration: 300 mg (01/02/2020), 300 mg (03/04/2020), 300 mg (03/18/2020), 300 mg (01/16/2020), 300 mg (01/30/2020), 300 mg (02/15/2020) oxaliplatin (ELOXATIN) 165 mg in dextrose 5 % 500 mL chemo infusion, 85 mg/m2 = 165 mg, Intravenous,  Once, 7 of 8 cycles Administration: 165 mg (01/02/2020), 165 mg (03/04/2020), 165 mg (03/18/2020), 165 mg (01/16/2020), 165 mg (01/30/2020), 165 mg (02/15/2020) fosaprepitant (EMEND) 150 mg in sodium chloride 0.9 % 145 mL IVPB, 150 mg, Intravenous,  Once, 7 of 8 cycles Administration: 150 mg (01/02/2020), 150 mg (03/04/2020), 150 mg (03/18/2020), 150 mg (01/16/2020), 150 mg (01/30/2020), 150 mg (02/15/2020) fluorouracil (ADRUCIL) 4,700 mg in sodium chloride 0.9 % 56 mL chemo infusion, 2,400 mg/m2 = 4,700 mg, Intravenous, 1 Day/Dose, 7 of 8 cycles Administration: 4,700 mg (01/02/2020), 4,700 mg (03/04/2020), 4,700 mg (03/18/2020), 4,700 mg (01/16/2020), 4,700 mg (01/30/2020), 4,700 mg (02/15/2020) leucovorin 800 mg in sodium chloride 0.9 % 250 mL infusion, 784 mg, Intravenous,  Once, 7 of 8 cycles Administration: 800 mg (01/02/2020), 800 mg (03/04/2020), 800 mg (03/18/2020), 800 mg (01/16/2020), 800 mg (01/30/2020)  for chemotherapy treatment.     HISTORY OF PRESENTING ILLNESS:  Robin Daniels 65 y.o.  female borderline resectable pancreatic adeno ca currently on neoadjuvant chemotherapy with FOLFIRINOX. Patient follows up with Dr.Brahmanday  who is off today, I am covering  him to see this patient. Patient is currently status post 6 cycles of chemotherapy.  Overall she tolerates chemotherapy with some problems including eye twitches, leg pain cramps, thumbs locking up.  She told me that Dr. B is aware about the symptoms. No nausea vomiting diarrhea.  Review of Systems  Constitutional: Positive for weight loss. Negative for chills, diaphoresis, fever and malaise/fatigue.  HENT: Negative for nosebleeds and sore throat.   Eyes: Negative for double vision.  Respiratory: Negative for cough, hemoptysis, sputum production, shortness of breath and wheezing.   Cardiovascular: Negative for chest pain, palpitations, orthopnea and leg swelling.  Gastrointestinal: Negative for abdominal pain, blood in stool, constipation, diarrhea, heartburn, melena, nausea and vomiting.  Genitourinary: Negative for dysuria, frequency and urgency.  Musculoskeletal: Negative for back pain and joint pain.  Skin: Negative.  Negative for itching and rash.  Neurological: Negative for dizziness, tingling, focal weakness, weakness and headaches.  Endo/Heme/Allergies: Does not bruise/bleed easily.  Psychiatric/Behavioral: Negative for depression. The patient is not nervous/anxious and does not have insomnia.      MEDICAL HISTORY:  Past Medical History:  Diagnosis Date  . Anemia    history of  . Anxiety 09/11/2014  . Arthritis   . 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 Diabetes mellitus without complication (HCC)   . Fluttering heart   . Hyperlipidemia   . Hypertension     SURGICAL HISTORY: Past Surgical History:  Procedure Laterality Date  . ENDOSCOPIC RETROGRADE CHOLANGIOPANCREATOGRAPHY (ERCP) WITH PROPOFOL N/A 11/16/2019   Procedure: ENDOSCOPIC RETROGRADE CHOLANGIOPANCREATOGRAPHY (ERCP) WITH PROPOFOL;  Surgeon: Midge MiniumWohl, Darren, MD;  Location: [facility] ENDOSCOPY;  Service: Endoscopy;  Laterality: N/A;  . ERCP N/A 03/27/2020   Procedure: ENDOSCOPIC RETROGRADE CHOLANGIOPANCREATOGRAPHY  (ERCP);  Surgeon: Midge MiniumWohl, Darren, MD;  [facility];  Service: Endoscopy;  Laterality: N/A;  . EUS N/A 11/29/2019   Procedure: FULL UPPER ENDOSCOPIC ULTRASOUND (EUS) RADIAL;  Surgeon: Doren CustardSpaete, Joshua P, MD;  Location: [facility] ENDOSCOPY;  Service: Gastroenterology;  Laterality: N/A;  . IR IMAGING GUIDED PORT INSERTION  12/14/2019  . right knee replacement Right [id number]  . SHOULDER ARTHROSCOPY W/ ROTATOR CUFF REPAIR Right     SOCIAL HISTORY: Social History   Socioeconomic History  . Marital status: Divorced    Spouse name: Not on file  . Number of children: Not on file  . Years of education: Not on file  . Highest education level: Not on file  Occupational History  . Not on file  Tobacco Use  . Smoking status: Never Smoker  . Smokeless tobacco: Never Used  Vaping Use  . Vaping Use: Never used  Substance and Sexual Activity  . Alcohol use: No    Alcohol/week: 0.0 standard drinks  . Drug use: No  . Sexual activity: Never  Other Topics Concern  . Not on file  Social History Narrative   [place]; with mom and son; worked in dietary; never smoked; no alcohol.    Social Determinants of Health   Financial Resource Strain: Not on file  Food Insecurity: Not on file  Transportation Needs: Not on file  Physical Activity: Not on file  Stress: Not on file  Social Connections: Not on file  Intimate Partner Violence: Not on file    FAMILY HISTORY: Family History  Problem Relation Age of Onset  . Diabetes Mother   . Hyperlipidemia Mother   . Hypertension Mother   . Vision loss Mother   . Arthritis  Father   . Hyperlipidemia Father   . Hypertension Father   . Breast cancer Maternal Aunt     ALLERGIES:  has No Known Allergies.  MEDICATIONS:  Current Outpatient Medications  Medication Sig Dispense Refill  . acetaminophen (TYLENOL) 650 MG CR tablet Take 1 tablet by mouth every 8 (eight) hours as needed.    Marland Kitchen amLODipine (NORVASC) 10 MG tablet Take 10 mg by mouth  daily.     Marland Kitchen lidocaine-prilocaine (EMLA) cream SMARTSIG:3 Topical 10 Times Daily PRN    . metoprolol succinate (TOPROL-XL) 50 MG 24 hr tablet Take 50 mg by mouth daily. Take with or immediately following a meal.    . Potassium Chloride ER 20 MEQ TBCR Take 20 mEq by mouth in the morning and at bedtime. 60 tablet 1  . vitamin B-12 (CYANOCOBALAMIN) 1000 MCG tablet Take 1 tablet (1,000 mcg total) by mouth daily.    . diphenoxylate-atropine (LOMOTIL) 2.5-0.025 MG tablet Take 1 tablet by mouth 4 (four) times daily as needed for diarrhea or loose stools. Take it along with immodium (Patient not taking: No sig reported) 45 tablet 0   No current facility-administered medications for this visit.   Facility-Administered Medications Ordered in Other Visits  Medication Dose Route Frequency Provider Last Rate Last Admin  . atropine injection 0.5 mg  0.5 mg Intravenous Once Earlie Server, MD      . atropine injection 0.5 mg  0.5 mg Intravenous Once Earlie Server, MD      . fluorouracil (ADRUCIL) 4,700 mg in sodium chloride 0.9 % 56 mL chemo infusion  2,400 mg/m2 (Treatment Plan Recorded) Intravenous 1 day or 1 dose Earlie Server, MD      . heparin lock flush 100 unit/mL  500 Units Intravenous Once Cammie Sickle, MD      . irinotecan (CAMPTOSAR) 300 mg in sodium chloride 0.9 % 500 mL chemo infusion  150 mg/m2 (Treatment Plan Recorded) Intravenous Once Earlie Server, MD      . leucovorin 800 mg in sodium chloride 0.9 % 250 mL infusion  408 mg/m2 (Treatment Plan Recorded) Intravenous Once Earlie Server, MD      . oxaliplatin (ELOXATIN) 165 mg in dextrose 5 % 500 mL chemo infusion  85 mg/m2 (Treatment Plan Recorded) Intravenous Once Earlie Server, MD 267 mL/hr at 04/01/20 1217 165 mg at 04/01/20 1217      .  PHYSICAL EXAMINATION: ECOG PERFORMANCE STATUS: 1 - Symptomatic but completely ambulatory  Vitals:   04/01/20 0920 04/01/20 0950  BP: (!) 177/102 (!) 181/92  Pulse: 91 93  Resp: 18   Temp: 98.2 F (36.8 C)    Filed  Weights   04/01/20 0920  Weight: 189 lb (85.7 kg)    Physical Exam HENT:     Head: Normocephalic and atraumatic.     Mouth/Throat:     Pharynx: No oropharyngeal exudate.  Eyes:     Pupils: Pupils are equal, round, and reactive to light.  Cardiovascular:     Rate and Rhythm: Normal rate and regular rhythm.  Pulmonary:     Effort: Pulmonary effort is normal. No respiratory distress.     Breath sounds: Normal breath sounds. No wheezing.  Abdominal:     General: Bowel sounds are normal. There is no distension.     Palpations: Abdomen is soft. There is no mass.     Tenderness: There is no abdominal tenderness. There is no guarding or rebound.  Musculoskeletal:        General: No  tenderness. Normal range of motion.     Cervical back: Normal range of motion and neck supple.  Skin:    General: Skin is warm.  Neurological:     Mental Status: She is alert and oriented to person, place, and time.  Psychiatric:        Mood and Affect: Affect normal.      LABORATORY DATA:  I have reviewed the data as listed Lab Results  Component Value Date   WBC 4.9 04/01/2020   HGB 9.2 (L) 04/01/2020   HCT 29.0 (L) 04/01/2020   MCV 77.3 (L) 04/01/2020   PLT 224 04/01/2020   Recent Labs    11/21/19 1449 12/04/19 1327 12/19/19 0833 01/02/20 0831 03/04/20 0851 03/18/20 0816 04/01/20 0906  NA 133* 136 139   < > 136 136 136  K 4.2 3.6 3.2*   < > 3.3* 3.0* 3.1*  CL 91* 102 102   < > 100 104 100  CO2 30 26 26    < > 23 23 26   GLUCOSE 169* 126* 168*   < > 118* 131* 139*  BUN 15 7* 8   < > 9 8 6*  CREATININE 1.02* 0.90 0.89   < > 0.80 0.89 0.83  CALCIUM 9.0 8.7* 8.9   < > 9.1 8.7* 9.0  GFRNONAA 57* >60 >60   < > >60 >60 >60  GFRAA >60 >60 >60  --   --   --   --   PROT 8.3* 7.3 7.6   < > 7.6 7.3 7.4  ALBUMIN 3.4* 3.5 3.5   < > 3.6 3.4* 3.4*  AST 45* 19 35   < > 23 28 30   ALT 137* 22 50*   < > 16 17 29   ALKPHOS 405* 156* 294*   < > 109 113 157*  BILITOT 1.8* 0.9 0.7   < > 0.5 0.4 0.3   <  > = values in this interval not displayed.    RADIOGRAPHIC STUDIES: I have personally reviewed the radiological images as listed and agreed with the findings in the report. DG C-Arm 1-60 Min-No Report  Result Date: 03/27/2020 Fluoroscopy was utilized by the requesting physician.  No radiographic interpretation.   CT PANCREAS ABD W/WO  Result Date: 03/13/2020 CLINICAL DATA:  Pancreatic cancer, assess treatment response. No reported symptoms in this 65 year old female EXAM: CT ABDOMEN WITHOUT AND WITH CONTRAST TECHNIQUE: Multidetector CT imaging of the abdomen was performed following the standard protocol before and following the bolus administration of intravenous contrast. CONTRAST:  166mL OMNIPAQUE IOHEXOL 300 MG/ML  SOLN COMPARISON:  PET exam from November 28, 2019 and CT of the abdomen from 12/11/2019 FINDINGS: Lower chest: Pneumobilia as on the previous study. Gallbladder less distended, some wall thickening perhaps partially due to under distension. Mild mural stratification of the gallbladder wall. Pancreas: Area of hypodensity in the pancreatic head-neck difficult to measure. Ductal caliber change and low attenuation around the distal common bile duct as on the previous study. Area measuring approximately 2.1 x 2.1 cm, (image 46, series 14) is not well-defined on the prior or on the current exam. 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 Difficult to definitively discern given inability to clearly visualize the known lesion in this location. No distortion of the portal vein or SMV. No involvement of the celiac or SMA. Classic hepatic arterial anatomy arising from the celiac axis. Mild pancreatic ductal dilation similar to the prior study. No acute pancreatic process. The area of low attenuation  adjacent to the neck of the pancreas is markedly diminished. This showed fluid density previously and is essentially no longer visible, this was along the margin of the SMV on the prior study.  Spleen: Spleen normal size without focal lesion. Adrenals/Urinary Tract: Adrenal glands are normal. Symmetric renal enhancement. No hydronephrosis. Stomach/Bowel: No acute gastrointestinal process. Gastrointestinal tract is unremarkable. The appendix is normal. Vascular/Lymphatic: Calcified atheromatous plaque of the abdominal aorta. No aneurysmal dilation. Small lymph nodes about the porta hepatis and pancreas show little change largest is a portacaval lymph node 11 mm and another small peripancreatic lymph node that is approximately 9 mm short axis (image 40, series 4). This peripancreatic lymph node near the head of the pancreas was previously 7 mm short axis. Other: No ascites Musculoskeletal: No acute bone finding. No destructive bone process. IMPRESSION: 1. Pancreatic lesion with ill-defined margins appears similar accounting for very limited visualization of discrete lesion on the current scan. 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 No signs of arterial involvement. 2. Cystic area adjacent to the neck of the pancreas is nearly completely resolved. Perhaps a very small 9 mm focus at the site of a previously 2.5 x 1.5 cm area. 3. Small lymph nodes about the porta hepatis and pancreas show little change. Slightly increased size of a peripancreatic lymph node, this remains less than a cm. 4. Persistent biliary duct distension with biliary stent in situ. 5. Gallbladder with mural stratification and mucosal enhancement, less distended than on the prior study. This change likely relates to degree of distension and prior inflammation. Correlate with any symptoms that would suggest ongoing gallbladder pathology/cholecystitis. Reportedly the patient was asymptomatic at the time of scan. 6. Aortic atherosclerosis. These results will be called to the ordering clinician or representative by the Radiologist Assistant, and communication documented in the PACS or [employer]. Aortic Atherosclerosis (ICD10-I70.0). Electronically Signed   By: Zetta Bills M.D.   On: 03/13/2020 16:56    ASSESSMENT & PLAN:   1. Cancer of head of pancreas ([place])   2. Encounter for antineoplastic chemotherapy   3. Anemia due to antineoplastic chemotherapy   4. Hypokalemia   5. Uncontrolled hypertension    #Borderline resectable pancreatic cancer Stage IB abutment of the SMV;  neoadjuvant chemotherapy- on FOLFIRINX. 12/17-ill-defined pancreatic lesion similar size; possible mild abutment of the portal vein/proximal SMV.  Significant improvement of the cystic lesion adjacent to the neck of pancreas [currently 9 mm previously 2.5 cm]; slight increase in size of the peripancreatic lymph node [<cm in size]; gallbladder changes- see below.  Tumor marker normalized Plan is to continue neoadjuvant chemotherapy for a few cycles.  Patient receives G-CSF Labs reviewed and discussed with patient.  Continue chemotherapy.  Proceed with FOLFIRINOX today. Tumor marker is pending  Mucositis, symptoms are stable.  Continue salt and baking soda swish and spit Hypokalemia, potassium is 3.1, patient will receive IV potassium chloride 20 mEq today.  Continue K. Dur 20 mEq twice daily.  Myalgia possible chemotherapy induced adverse reactions.  Patient receives Atropin  BP is high, patient reports that she forgets to take her blood pressure medication last evening.  No neurologic symptoms currently.  Advised patient to take her BP meds after she arrives home today.  Recommend patient to monitor blood pressure at home.  #Follow-up 2 weeks with Dr. Rogue Bussing, lab MD FOLFIRINOX, day 3 G-CSF  All questions were answered. The patient knows to call the clinic with any problems, questions  or concerns.    Earlie Server, MD 04/01/2020 12:55 PM

## 2020-04-02 ENCOUNTER — Telehealth: Payer: Self-pay | Admitting: *Deleted

## 2020-04-02 LAB — CANCER ANTIGEN 19-9: CA 19-9: 34 U/mL (ref 0–35)

## 2020-04-02 NOTE — Telephone Encounter (Signed)
Called Dr. Arita Miss office at [facility] in [place].

## 2020-04-03 ENCOUNTER — Inpatient Hospital Stay: Payer: Medicare Other

## 2020-04-03 VITALS — BP 178/92 | HR 91 | Temp 97.0°F | Resp 19

## 2020-04-03 DIAGNOSIS — C250 Malignant neoplasm of head of pancreas: Secondary | ICD-10-CM

## 2020-04-03 DIAGNOSIS — Z5111 Encounter for antineoplastic chemotherapy: Secondary | ICD-10-CM | POA: Diagnosis not present

## 2020-04-03 MED ORDER — PEGFILGRASTIM-BMEZ 6 MG/0.6ML ~~LOC~~ SOSY
6.0000 mg | PREFILLED_SYRINGE | Freq: Once | SUBCUTANEOUS | Status: AC
  Administered 2020-04-03: 6 mg via SUBCUTANEOUS
  Filled 2020-04-03: qty 0.6

## 2020-04-03 MED ORDER — SODIUM CHLORIDE 0.9% FLUSH
10.0000 mL | INTRAVENOUS | Status: DC | PRN
Start: 2020-04-03 — End: 2020-04-03
  Administered 2020-04-03: 10 mL
  Filled 2020-04-03: qty 10

## 2020-04-03 MED ORDER — HEPARIN SOD (PORK) LOCK FLUSH 100 UNIT/ML IV SOLN
500.0000 [IU] | Freq: Once | INTRAVENOUS | Status: AC | PRN
  Administered 2020-04-03: 500 [IU]
  Filled 2020-04-03: qty 5

## 2020-04-03 MED ORDER — HEPARIN SOD (PORK) LOCK FLUSH 100 UNIT/ML IV SOLN
INTRAVENOUS | Status: AC
  Filled 2020-04-03: qty 5

## 2020-04-03 NOTE — Anesthesia Postprocedure Evaluation (Signed)
Anesthesia Post Note  Patient: Robin Daniels  Procedure(s) Performed: ENDOSCOPIC RETROGRADE CHOLANGIOPANCREATOGRAPHY (ERCP) (N/A )  Patient location during evaluation: PACU Anesthesia Type: General Level of consciousness: awake and alert Pain management: pain level controlled Vital Signs Assessment: post-procedure vital signs reviewed and stable Respiratory status: spontaneous breathing, nonlabored ventilation, respiratory function stable and patient connected to nasal cannula oxygen Cardiovascular status: blood pressure returned to baseline and stable Postop Assessment: no apparent nausea or vomiting Anesthetic complications: no   No complications documented.   Last Vitals:  Vitals:   03/27/20 1053 03/27/20 1103  BP: (!) 199/97 (!) 199/95  Pulse: 84 82  Resp: (!) 23 16  Temp:    SpO2: 99% 99%    Last Pain:  Vitals:   03/28/20 1355  TempSrc:   PainSc: 0-No pain                 Yevette Edwards

## 2020-04-03 NOTE — Progress Notes (Signed)
Stable at discharge 

## 2020-04-14 ENCOUNTER — Other Ambulatory Visit: Payer: Self-pay

## 2020-04-14 ENCOUNTER — Other Ambulatory Visit: Payer: Self-pay | Admitting: *Deleted

## 2020-04-14 DIAGNOSIS — C250 Malignant neoplasm of head of pancreas: Secondary | ICD-10-CM

## 2020-04-15 ENCOUNTER — Inpatient Hospital Stay: Payer: Medicare Other

## 2020-04-15 ENCOUNTER — Inpatient Hospital Stay (HOSPITAL_BASED_OUTPATIENT_CLINIC_OR_DEPARTMENT_OTHER): Payer: Medicare Other | Admitting: Internal Medicine

## 2020-04-15 VITALS — BP 173/85 | HR 94 | Resp 18

## 2020-04-15 DIAGNOSIS — C250 Malignant neoplasm of head of pancreas: Secondary | ICD-10-CM | POA: Diagnosis not present

## 2020-04-15 DIAGNOSIS — Z5111 Encounter for antineoplastic chemotherapy: Secondary | ICD-10-CM | POA: Diagnosis not present

## 2020-04-15 DIAGNOSIS — I10 Essential (primary) hypertension: Secondary | ICD-10-CM

## 2020-04-15 LAB — COMPREHENSIVE METABOLIC PANEL
ALT: 21 U/L (ref 0–44)
AST: 36 U/L (ref 15–41)
Albumin: 3.5 g/dL (ref 3.5–5.0)
Alkaline Phosphatase: 153 U/L — ABNORMAL HIGH (ref 38–126)
Anion gap: 9 (ref 5–15)
BUN: 5 mg/dL — ABNORMAL LOW (ref 8–23)
CO2: 25 mmol/L (ref 22–32)
Calcium: 9.1 mg/dL (ref 8.9–10.3)
Chloride: 104 mmol/L (ref 98–111)
Creatinine, Ser: 0.81 mg/dL (ref 0.44–1.00)
GFR, Estimated: >60 mL/min (ref >60)
Glucose, Bld: 152 mg/dL — ABNORMAL HIGH (ref 70–99)
Potassium: 3.5 mmol/L (ref 3.5–5.1)
Sodium: 138 mmol/L (ref 135–145)
Total Bilirubin: 0.3 mg/dL (ref 0.3–1.2)
Total Protein: 7.6 g/dL (ref 6.5–8.1)

## 2020-04-15 LAB — CBC WITH DIFFERENTIAL/PLATELET
Abs Immature Granulocytes: 0.27 10*3/uL — ABNORMAL HIGH (ref 0.00–0.07)
Basophils Absolute: 0 10*3/uL (ref 0.0–0.1)
Basophils Relative: 1 %
Eosinophils Absolute: 0.1 10*3/uL (ref 0.0–0.5)
Eosinophils Relative: 2 %
HCT: 30 % — ABNORMAL LOW (ref 36.0–46.0)
Hemoglobin: 9.4 g/dL — ABNORMAL LOW (ref 12.0–15.0)
Immature Granulocytes: 5 %
Lymphocytes Relative: 29 %
Lymphs Abs: 1.5 10*3/uL (ref 0.7–4.0)
MCH: 24.5 pg — ABNORMAL LOW (ref 26.0–34.0)
MCHC: 31.3 g/dL (ref 30.0–36.0)
MCV: 78.1 fL — ABNORMAL LOW (ref 80.0–100.0)
Monocytes Absolute: 0.9 10*3/uL (ref 0.1–1.0)
Monocytes Relative: 17 %
Neutro Abs: 2.5 10*3/uL (ref 1.7–7.7)
Neutrophils Relative %: 46 %
Platelets: 204 10*3/uL (ref 150–400)
RBC: 3.84 MIL/uL — ABNORMAL LOW (ref 3.87–5.11)
RDW: 19 % — ABNORMAL HIGH (ref 11.5–15.5)
WBC: 5.2 10*3/uL (ref 4.0–10.5)
nRBC: 0.4 % — ABNORMAL HIGH (ref 0.0–0.2)

## 2020-04-15 MED ORDER — SODIUM CHLORIDE 0.9 % IV SOLN
Freq: Once | INTRAVENOUS | Status: AC
  Filled 2020-04-15: qty 250

## 2020-04-15 MED ORDER — AMLODIPINE BESYLATE 5 MG PO TABS
10.0000 mg | ORAL_TABLET | Freq: Once | ORAL | Status: AC
  Administered 2020-04-15: 10 mg via ORAL
  Filled 2020-04-15: qty 2

## 2020-04-15 MED ORDER — ATROPINE SULFATE 1 MG/ML IJ SOLN
0.5000 mg | Freq: Once | INTRAMUSCULAR | Status: AC
  Administered 2020-04-15: 0.5 mg via INTRAVENOUS

## 2020-04-15 MED ORDER — SODIUM CHLORIDE 0.9 % IV SOLN
10.0000 mg | Freq: Once | INTRAVENOUS | Status: AC
  Administered 2020-04-15: 10 mg via INTRAVENOUS
  Filled 2020-04-15: qty 10

## 2020-04-15 MED ORDER — SODIUM CHLORIDE 0.9 % IV SOLN
2400.0000 mg/m2 | INTRAVENOUS | Status: DC
  Administered 2020-04-15: 4700 mg via INTRAVENOUS
  Filled 2020-04-15: qty 94

## 2020-04-15 MED ORDER — PALONOSETRON HCL INJECTION 0.25 MG/5ML
0.2500 mg | Freq: Once | INTRAVENOUS | Status: AC
  Administered 2020-04-15: 0.25 mg via INTRAVENOUS
  Filled 2020-04-15: qty 5

## 2020-04-15 MED ORDER — ATROPINE SULFATE 1 MG/ML IJ SOLN
0.5000 mg | Freq: Once | INTRAMUSCULAR | Status: AC
  Administered 2020-04-15: 0.5 mg via INTRAVENOUS
  Filled 2020-04-15: qty 1

## 2020-04-15 MED ORDER — DEXTROSE 5 % IV SOLN
Freq: Once | INTRAVENOUS | Status: AC
  Filled 2020-04-15: qty 250

## 2020-04-15 MED ORDER — SODIUM CHLORIDE 0.9 % IV SOLN
150.0000 mg | Freq: Once | INTRAVENOUS | Status: AC
  Administered 2020-04-15: 150 mg via INTRAVENOUS
  Filled 2020-04-15: qty 150

## 2020-04-15 MED ORDER — SODIUM CHLORIDE 0.9 % IV SOLN
150.0000 mg/m2 | Freq: Once | INTRAVENOUS | Status: AC
  Administered 2020-04-15: 300 mg via INTRAVENOUS
  Filled 2020-04-15: qty 15

## 2020-04-15 MED ORDER — SODIUM CHLORIDE 0.9 % IV SOLN
408.0000 mg/m2 | Freq: Once | INTRAVENOUS | Status: AC
  Administered 2020-04-15: 800 mg via INTRAVENOUS
  Filled 2020-04-15: qty 40

## 2020-04-15 MED ORDER — OXALIPLATIN CHEMO INJECTION 100 MG/20ML
85.0000 mg/m2 | Freq: Once | INTRAVENOUS | Status: AC
  Administered 2020-04-15: 165 mg via INTRAVENOUS
  Filled 2020-04-15: qty 33

## 2020-04-15 NOTE — Progress Notes (Signed)
HR 105, BP high, patient forgot to take BP meds.  Giving norvasc per MD

## 2020-04-15 NOTE — Progress Notes (Signed)
BP 197/96, HR 105. Per Dr. Rogue Bussing, patient to receive Norvasc 10 MG PO x 1 due to patient forgetting to take BP meds. Okay to proceed with treatment.   Patient tolerated treatment well. VS WNL for patient's baseline. Patient stable at discharge.

## 2020-04-15 NOTE — Progress Notes (Signed)
[place] NOTE  Patient Care Team: [place] as PCP - General [name], [place], FNP (Family Medicine) Rico Junker, RN as Registered Nurse Theodore Demark, RN as Registered Nurse Clent Jacks, RN as Oncology Nurse Navigator Stark Klein, MD as Consulting Physician (General Surgery)  CHIEF COMPLAINTS/PURPOSE OF CONSULTATION: pancreas adenocarcinoma   Oncology History Overview Note  #Pancreas adenocarcinoma-Stage IB- uT2uNxuMx-[EUS- Dr.Spaete; [facility]/GI; mass 22x64mm; invading/abutting superior mesenteric vein; 2 enlarged lymph nodes peripancreatic/porta hepatis largest 10 x 5.8 mm-nonpathologic based on EUS criteria/no biopsy]-borderline resectable.  PET scan no evidence of distant metastatic disease.  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 Stevan Born September]  # 01/02/2020-FOLFIRINOX; Neulasta.  # Borderline DM; HTN   # SURVIVORSHIP:   # GENETICS:   DIAGNOSIS: Pancreatic cancer  STAGE:  IB/borderline resectable;  GOALS: cure  CURRENT/MOST RECENT THERAPY : Neoadjuvant-FOLFIRINOX    Cancer of head of pancreas ([place])  12/04/2019 Initial Diagnosis   Cancer of head of pancreas ([place])   12/19/2019 - 12/19/2019 Chemotherapy   The patient had PACLitaxel-protein bound (ABRAXANE) chemo infusion 250 mg, 125 mg/m2 = 250 mg, Intravenous,  Once, 1 of 4 cycles Administration: 250 mg (12/19/2019) gemcitabine (GEMZAR) 2,000 mg in sodium chloride 0.9 % 250 mL chemo infusion, 1,976 mg, Intravenous,  Once, 1 of 4 cycles Administration: 2,000 mg (12/19/2019)  for chemotherapy treatment.    01/02/2020 -  Chemotherapy    Patient is on Treatment Plan: PANCREAS MODIFIED FOLFIRINOX Q14D X 4 CYCLES       HISTORY OF PRESENTING ILLNESS:  Robin Daniels 65 y.o.  female borderline resectable pancreatic adeno ca currently  on neoadjuvant chemotherapy with FOLFIRINOX is here for a follow up.  In the interim patient is appointment with surgery-Dr. Barry Dienes because of transportation issues.  Patient had thought her appointment was in [place] as scheduled.  She was unable to make it appointment  Patient is currently status post 7 cycles of chemotherapy.  Patient complains of mild tingling and numbness in extremities.  No diarrhea.  No nausea vomiting.  Patient states that she has missed her blood pressure medications last night.  Review of Systems  Constitutional: Positive for weight loss. Negative for chills, diaphoresis, fever and malaise/fatigue.  HENT: Negative for nosebleeds and sore throat.   Eyes: Negative for double vision.  Respiratory: Negative for cough, hemoptysis, sputum production, shortness of breath and wheezing.   Cardiovascular: Negative for chest pain, palpitations, orthopnea and leg swelling.  Gastrointestinal: Negative for abdominal pain, blood in stool, constipation, diarrhea, heartburn, melena, nausea and vomiting.  Genitourinary: Negative for dysuria, frequency and urgency.  Musculoskeletal: Negative for back pain and joint pain.  Skin: Negative.  Negative for itching and rash.  Neurological: Positive for tingling. Negative for dizziness, focal weakness, weakness and headaches.  Endo/Heme/Allergies: Does not bruise/bleed easily.  Psychiatric/Behavioral: Negative for depression. The patient is not nervous/anxious and does not have insomnia.      MEDICAL HISTORY:  Past Medical History:  Diagnosis Date  . Anemia    history of  . Anxiety 09/11/2014  . Arthritis   . Cancer of head of pancreas ([place]) 12/04/2019  . Diabetes mellitus without complication ([place])   . Fluttering heart   . Hyperlipidemia   . Hypertension     SURGICAL HISTORY: Past Surgical History:  Procedure Laterality Date  . ENDOSCOPIC RETROGRADE CHOLANGIOPANCREATOGRAPHY (ERCP) WITH PROPOFOL  N/A 11/16/2019    Procedure: ENDOSCOPIC RETROGRADE CHOLANGIOPANCREATOGRAPHY (ERCP) WITH PROPOFOL;  Surgeon: Lucilla Lame, MD;  Location: [facility] ENDOSCOPY;  Service: Endoscopy;  Laterality: N/A;  . ERCP N/A 03/27/2020   Procedure: ENDOSCOPIC RETROGRADE CHOLANGIOPANCREATOGRAPHY (ERCP);  Surgeon: Lucilla Lame, MD;  Location: [facility] ENDOSCOPY;  Service: Endoscopy;  Laterality: N/A;  . EUS N/A 11/29/2019   Procedure: FULL UPPER ENDOSCOPIC ULTRASOUND (EUS) RADIAL;  Surgeon: Reita Cliche, MD;  Location: [facility] ENDOSCOPY;  Service: Gastroenterology;  Laterality: N/A;  . IR IMAGING GUIDED PORT INSERTION  12/14/2019  . right knee replacement Right [id number]  . SHOULDER ARTHROSCOPY W/ ROTATOR CUFF REPAIR Right     SOCIAL HISTORY: Social History   Socioeconomic History  . Marital status: Divorced    Spouse name: Not on file  . Number of children: Not on file  . Years of education: Not on file  . Highest education level: Not on file  Occupational History  . Not on file  Tobacco Use  . Smoking status: Never Smoker  . Smokeless tobacco: Never Used  Vaping Use  . Vaping Use: Never used  Substance and Sexual Activity  . Alcohol use: No    Alcohol/week: 0.0 standard drinks  . Drug use: No  . Sexual activity: Never  Other Topics Concern  . 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    Social Determinants of Health   Financial Resource Strain: Not on file  Food Insecurity: Not on file  Transportation Needs: Not on file  Physical Activity: Not on file  Stress: Not on file  Social Connections: Not on file  Intimate Partner Violence: Not on file    FAMILY HISTORY: Family History  Problem Relation Age of Onset  . Diabetes Mother   . Hyperlipidemia Mother   . Hypertension Mother   . Vision loss Mother   . Arthritis Father   . Hyperlipidemia Father   . Hypertension Father   . Breast cancer Maternal Aunt     ALLERGIES:  has  No Known Allergies.  MEDICATIONS:  Current Outpatient Medications  Medication Sig Dispense Refill  . amLODipine (NORVASC) 10 MG tablet Take 10 mg by mouth daily.     [place] Kitchen lidocaine-prilocaine (EMLA) cream Apply 1 application topically once.    . metoprolol succinate (TOPROL-XL) 50 MG 24 hr tablet Take 50 mg by mouth daily. Take with or immediately following a meal.    . Potassium Chloride ER 20 MEQ TBCR Take 20 mEq by mouth in the morning and at bedtime. 60 tablet 1  . vitamin B-12 (CYANOCOBALAMIN) 1000 MCG tablet Take 1 tablet (1,000 mcg total) by mouth daily.    . diphenoxylate-atropine (LOMOTIL) 2.5-0.025 MG tablet Take 1 tablet by mouth 4 (four) times daily as needed for diarrhea or loose stools. Take it along with immodium (Patient not taking: No sig reported) 45 tablet 0   No current facility-administered medications for this visit.   Facility-Administered Medications Ordered in Other Visits  Medication Dose Route Frequency Provider Last Rate Last Admin  . atropine injection 0.5 mg  0.5 mg Intravenous Once Charlaine Dalton R, MD      . atropine injection 0.5 mg  0.5 mg Intravenous Once Charlaine Dalton R, MD      . fluorouracil (ADRUCIL) 4,700 mg in sodium chloride 0.9 % 56 mL chemo infusion  2,400 mg/m2 (Treatment Plan Recorded) Intravenous 1 day or 1 dose [place], Amreen Raczkowski R,  MD      . irinotecan (CAMPTOSAR) 300 mg in sodium chloride 0.9 % 500 mL chemo infusion  150 mg/m2 (Treatment Plan Recorded) Intravenous Once Charlaine Dalton R, MD      . leucovorin 800 mg in sodium chloride 0.9 % 250 mL infusion  408 mg/m2 (Treatment Plan Recorded) Intravenous Once Cammie Sickle, MD      . oxaliplatin (ELOXATIN) 165 mg in dextrose 5 % 500 mL chemo infusion  85 mg/m2 (Treatment Plan Recorded) Intravenous Once Cammie Sickle, MD 267 mL/hr at 04/15/20 1221 165 mg at 04/15/20 1221      .  PHYSICAL EXAMINATION: ECOG PERFORMANCE STATUS: 1 - Symptomatic but completely  ambulatory  Vitals:   04/15/20 1026  BP: (!) 197/96  Pulse: (!) 105  Resp: 18  Temp: 98.5 F (36.9 C)  SpO2: 99%   Filed Weights   04/15/20 1026  Weight: 186 lb (84.4 kg)    Physical Exam HENT:     Head: Normocephalic and atraumatic.     Mouth/Throat:     Pharynx: No oropharyngeal exudate.  Eyes:     Pupils: Pupils are equal, round, and reactive to light.  Cardiovascular:     Rate and Rhythm: Normal rate and regular rhythm.  Pulmonary:     Effort: Pulmonary effort is normal. No respiratory distress.     Breath sounds: Normal breath sounds. No wheezing.  Abdominal:     General: Bowel sounds are normal. There is no distension.     Palpations: Abdomen is soft. There is no mass.     Tenderness: There is no abdominal tenderness. There is no guarding or rebound.  Musculoskeletal:        General: No tenderness. Normal range of motion.     Cervical back: Normal range of motion and neck supple.  Skin:    General: Skin is warm.  Neurological:     Mental Status: She is alert and oriented to person, place, and time.  Psychiatric:        Mood and Affect: Affect normal.      LABORATORY DATA:  I have reviewed the data as listed Lab Results  Component Value Date   WBC 5.2 04/15/2020   HGB 9.4 (L) 04/15/2020   HCT 30.0 (L) 04/15/2020   MCV 78.1 (L) 04/15/2020   PLT 204 04/15/2020   Recent Labs    11/21/19 1449 12/04/19 1327 12/19/19 0833 01/02/20 0831 03/18/20 0816 04/01/20 0906 04/15/20 1007  NA 133* 136 139   < > 136 136 138  K 4.2 3.6 3.2*   < > 3.0* 3.1* 3.5  CL 91* 102 102   < > 104 100 104  CO2 30 26 26    < > 23 26 25   GLUCOSE 169* 126* 168*   < > 131* 139* 152*  BUN 15 7* 8   < > 8 6* 5*  CREATININE 1.02* 0.90 0.89   < > 0.89 0.83 0.81  CALCIUM 9.0 8.7* 8.9   < > 8.7* 9.0 9.1  GFRNONAA 57* >60 >60   < > >60 >60 >60  GFRAA >60 >60 >60  --   --   --   --   PROT 8.3* 7.3 7.6   < > 7.3 7.4 7.6  ALBUMIN 3.4* 3.5 3.5   < > 3.4* 3.4* 3.5  AST 45* 19 35   < >  28 30 36  ALT 137* 22 50*   < > 17 29 21   ALKPHOS  405* 156* 294*   < > 113 157* 153*  BILITOT 1.8* 0.9 0.7   < > 0.4 0.3 0.3   < > = values in this interval not displayed.    RADIOGRAPHIC STUDIES: I have personally reviewed the radiological images as listed and agreed with the findings in the report. DG C-Arm 1-60 Min-No Report  Result Date: 03/27/2020 Fluoroscopy was utilized by the requesting physician.  No radiographic interpretation.    ASSESSMENT & PLAN:   Cancer of head of pancreas ([place]) #  Pancreas adenocarcinoma-Stage IB-/boderline resectable ;Given the abutment of the SMV;  neoadjuvant chemotherapy- on FOLFIRINX. S/p 5 cycles of chemo-CT  12/17-ill-defined pancreatic lesion similar size; possible mild abutment of the portal vein/proximal SMV.  Significant improvement of the cystic lesion adjacent to the neck of pancreas [currently 9 mm previously 2.5 cm]; slight increase in size of the peripancreatic lymph node [<cm in size].  Tumor marker normalized. Unable to keep appt with Dr.Byerly sec to transportation issues. Awaiting appt on Feb 25th in [place]; recommend pt call and try to move appt sooner.  # proceed with cycle #8  Continue FOLFIRINOX Labs today reviewed;  acceptable for treatment.CA 19-9 improving.  Continue with chemotherapy; until plans for surgery finalized.  # Mucositis- sec chemotherapy-grade 1 through 2.  Continue salt and baking   # Hypertension- poorly controlled; recommend compliance with medications. Continue Norvasc/Metoprlol   # Hypokalemia-today potassium is 3.5. Continue Kdur 20 BID.  # Myalgias/Jaw discomfort/PN- G-1-2;--STABLE;? oxaliplatin-Irinotecan AEs; plan pre-and post atropine.   # mediport malfunction: flushing; not drawing; NOV- dye study shows a fibrin sheath.     # DISPOSITION:  # FOLFIRONOX today;  pump off d-3/udenyca # in 2 weeks-X-MD; labs- cbc.cmp;ca-19-9; possible KCL over 1 hour; FOLFIRINOX; d-3 pump-OFF;udenyca;Dr.B   All  questions were answered. The patient knows to call the clinic with any problems, questions or concerns.    Cammie Sickle, MD 04/15/2020 1:12 PM

## 2020-04-15 NOTE — Assessment & Plan Note (Addendum)
#    Pancreas adenocarcinoma-Stage IB-/boderline resectable ;Given the abutment of the SMV;  neoadjuvant chemotherapy- on FOLFIRINX. S/p 5 cycles of chemo-CT  12/17-ill-defined pancreatic lesion similar size; possible mild abutment of the portal vein/proximal SMV.  Significant improvement of the cystic lesion adjacent to the neck of pancreas [currently 9 mm previously 2.5 cm]; slight increase in size of the peripancreatic lymph node [<cm in size].  Tumor marker normalized. Unable to keep appt with Dr.Byerly sec to transportation issues. Awaiting appt on Feb 25th in [place]; recommend pt call and try to move appt sooner.  # proceed with cycle #8  Continue FOLFIRINOX Labs today reviewed;  acceptable for treatment.CA 19-9 improving.  Continue with chemotherapy; until plans for surgery finalized.  # Mucositis- sec chemotherapy-grade 1 through 2.  Continue salt and baking   # Hypertension- poorly controlled; recommend compliance with medications. Continue Norvasc/Metoprlol   # Hypokalemia-today potassium is 3.5. Continue Kdur 20 BID.  # Myalgias/Jaw discomfort/PN- G-1-2;--STABLE;? oxaliplatin-Irinotecan AEs; plan pre-and post atropine.   # mediport malfunction: flushing; not drawing; NOV- dye study shows a fibrin sheath.     # DISPOSITION:  # FOLFIRONOX today;  pump off d-3/udenyca # in 2 weeks-X-MD; labs- cbc.cmp;ca-19-9; possible KCL over 1 hour; FOLFIRINOX; d-3 pump-OFF;udenyca;Dr.B

## 2020-04-16 ENCOUNTER — Inpatient Hospital Stay: Payer: Medicare Other

## 2020-04-16 LAB — CANCER ANTIGEN 19-9: CA 19-9: 25 U/mL (ref 0–35)

## 2020-04-17 ENCOUNTER — Inpatient Hospital Stay: Payer: Medicare Other

## 2020-04-17 VITALS — BP 155/87 | HR 105 | Temp 97.3°F | Resp 20

## 2020-04-17 DIAGNOSIS — Z5111 Encounter for antineoplastic chemotherapy: Secondary | ICD-10-CM | POA: Diagnosis not present

## 2020-04-17 DIAGNOSIS — C250 Malignant neoplasm of head of pancreas: Secondary | ICD-10-CM

## 2020-04-17 MED ORDER — HEPARIN SOD (PORK) LOCK FLUSH 100 UNIT/ML IV SOLN
INTRAVENOUS | Status: AC
  Filled 2020-04-17: qty 5

## 2020-04-17 MED ORDER — SODIUM CHLORIDE 0.9% FLUSH
10.0000 mL | INTRAVENOUS | Status: DC | PRN
  Administered 2020-04-17: 10 mL
  Filled 2020-04-17: qty 10

## 2020-04-17 MED ORDER — PEGFILGRASTIM-BMEZ 6 MG/0.6ML ~~LOC~~ SOSY
6.0000 mg | PREFILLED_SYRINGE | Freq: Once | SUBCUTANEOUS | Status: AC
  Administered 2020-04-17: 6 mg via SUBCUTANEOUS
  Filled 2020-04-17: qty 0.6

## 2020-04-17 MED ORDER — HEPARIN SOD (PORK) LOCK FLUSH 100 UNIT/ML IV SOLN
500.0000 [IU] | Freq: Once | INTRAVENOUS | Status: AC | PRN
  Administered 2020-04-17: 500 [IU]
  Filled 2020-04-17: qty 5

## 2020-04-17 NOTE — Progress Notes (Signed)
Pt arrived for pump d/c and neulasta injection today.  Pt reports tolerating pump infusion well at home, no complaints.  Pump removed per policy.  Injection administered to right arm, site benign.  Pt left infusion suite stable and ambulatory.

## 2020-04-29 ENCOUNTER — Inpatient Hospital Stay: Payer: Medicare Other | Attending: Oncology

## 2020-04-29 ENCOUNTER — Inpatient Hospital Stay: Payer: Medicare Other

## 2020-04-29 ENCOUNTER — Encounter: Payer: Self-pay | Admitting: Internal Medicine

## 2020-04-29 ENCOUNTER — Inpatient Hospital Stay (HOSPITAL_BASED_OUTPATIENT_CLINIC_OR_DEPARTMENT_OTHER): Payer: Medicare Other | Admitting: Internal Medicine

## 2020-04-29 VITALS — BP 185/82 | HR 105 | Resp 18

## 2020-04-29 DIAGNOSIS — C250 Malignant neoplasm of head of pancreas: Secondary | ICD-10-CM

## 2020-04-29 DIAGNOSIS — Z5189 Encounter for other specified aftercare: Secondary | ICD-10-CM | POA: Insufficient documentation

## 2020-04-29 DIAGNOSIS — E876 Hypokalemia: Secondary | ICD-10-CM

## 2020-04-29 DIAGNOSIS — Z5111 Encounter for antineoplastic chemotherapy: Secondary | ICD-10-CM | POA: Insufficient documentation

## 2020-04-29 DIAGNOSIS — Z452 Encounter for adjustment and management of vascular access device: Secondary | ICD-10-CM | POA: Diagnosis not present

## 2020-04-29 DIAGNOSIS — I10 Essential (primary) hypertension: Secondary | ICD-10-CM

## 2020-04-29 DIAGNOSIS — K1231 Oral mucositis (ulcerative) due to antineoplastic therapy: Secondary | ICD-10-CM | POA: Insufficient documentation

## 2020-04-29 LAB — CBC WITH DIFFERENTIAL/PLATELET
Abs Immature Granulocytes: 0.13 10*3/uL — ABNORMAL HIGH (ref 0.00–0.07)
Basophils Absolute: 0 10*3/uL (ref 0.0–0.1)
Basophils Relative: 0 %
Eosinophils Absolute: 0.1 10*3/uL (ref 0.0–0.5)
Eosinophils Relative: 1 %
HCT: 29.1 % — ABNORMAL LOW (ref 36.0–46.0)
Hemoglobin: 9.1 g/dL — ABNORMAL LOW (ref 12.0–15.0)
Immature Granulocytes: 3 %
Lymphocytes Relative: 39 %
Lymphs Abs: 2 10*3/uL (ref 0.7–4.0)
MCH: 24.7 pg — ABNORMAL LOW (ref 26.0–34.0)
MCHC: 31.3 g/dL (ref 30.0–36.0)
MCV: 79.1 fL — ABNORMAL LOW (ref 80.0–100.0)
Monocytes Absolute: 0.8 10*3/uL (ref 0.1–1.0)
Monocytes Relative: 16 %
Neutro Abs: 2 10*3/uL (ref 1.7–7.7)
Neutrophils Relative %: 41 %
Platelets: 146 10*3/uL — ABNORMAL LOW (ref 150–400)
RBC: 3.68 MIL/uL — ABNORMAL LOW (ref 3.87–5.11)
RDW: 19.2 % — ABNORMAL HIGH (ref 11.5–15.5)
WBC: 5 10*3/uL (ref 4.0–10.5)
nRBC: 0 % (ref 0.0–0.2)

## 2020-04-29 LAB — COMPREHENSIVE METABOLIC PANEL
ALT: 18 U/L (ref 0–44)
AST: 32 U/L (ref 15–41)
Albumin: 3.4 g/dL — ABNORMAL LOW (ref 3.5–5.0)
Alkaline Phosphatase: 143 U/L — ABNORMAL HIGH (ref 38–126)
Anion gap: 11 (ref 5–15)
BUN: 7 mg/dL — ABNORMAL LOW (ref 8–23)
CO2: 24 mmol/L (ref 22–32)
Calcium: 9 mg/dL (ref 8.9–10.3)
Chloride: 102 mmol/L (ref 98–111)
Creatinine, Ser: 0.75 mg/dL (ref 0.44–1.00)
GFR, Estimated: >60 mL/min (ref >60)
Glucose, Bld: 138 mg/dL — ABNORMAL HIGH (ref 70–99)
Potassium: 3.1 mmol/L — ABNORMAL LOW (ref 3.5–5.1)
Sodium: 137 mmol/L (ref 135–145)
Total Bilirubin: 0.4 mg/dL (ref 0.3–1.2)
Total Protein: 7.5 g/dL (ref 6.5–8.1)

## 2020-04-29 MED ORDER — SODIUM CHLORIDE 0.9 % IV SOLN
150.0000 mg/m2 | Freq: Once | INTRAVENOUS | Status: AC
  Administered 2020-04-29: 300 mg via INTRAVENOUS
  Filled 2020-04-29: qty 15

## 2020-04-29 MED ORDER — SODIUM CHLORIDE 0.9 % IV SOLN
10.0000 mg | Freq: Once | INTRAVENOUS | Status: AC
  Administered 2020-04-29: 10 mg via INTRAVENOUS
  Filled 2020-04-29: qty 10

## 2020-04-29 MED ORDER — LIDOCAINE-PRILOCAINE 2.5-2.5 % EX CREA: 1.0000 "application " | TOPICAL_CREAM | Freq: Once | CUTANEOUS | 3 refills | Status: AC

## 2020-04-29 MED ORDER — SODIUM CHLORIDE 0.9% FLUSH
10.0000 mL | Freq: Once | INTRAVENOUS | Status: AC
  Administered 2020-04-29: 10 mL via INTRAVENOUS
  Filled 2020-04-29: qty 10

## 2020-04-29 MED ORDER — SODIUM CHLORIDE 0.9 % IV SOLN
800.0000 mg | Freq: Once | INTRAVENOUS | Status: AC
  Administered 2020-04-29: 800 mg via INTRAVENOUS
  Filled 2020-04-29: qty 40

## 2020-04-29 MED ORDER — FLUOROURACIL CHEMO INJECTION 5 GM/100ML
2400.0000 mg/m2 | INTRAVENOUS | Status: DC
  Administered 2020-04-29: 4700 mg via INTRAVENOUS
  Filled 2020-04-29: qty 94

## 2020-04-29 MED ORDER — OXALIPLATIN CHEMO INJECTION 100 MG/20ML
85.0000 mg/m2 | Freq: Once | INTRAVENOUS | Status: AC
  Administered 2020-04-29: 165 mg via INTRAVENOUS
  Filled 2020-04-29: qty 33

## 2020-04-29 MED ORDER — POTASSIUM CHLORIDE 20 MEQ/100ML IV SOLN
20.0000 meq | Freq: Once | INTRAVENOUS | Status: AC
  Administered 2020-04-29: 20 meq via INTRAVENOUS

## 2020-04-29 MED ORDER — ATROPINE SULFATE 1 MG/ML IJ SOLN
0.5000 mg | Freq: Once | INTRAMUSCULAR | Status: DC
  Filled 2020-04-29: qty 1

## 2020-04-29 MED ORDER — SODIUM CHLORIDE 0.9 % IV SOLN
Freq: Once | INTRAVENOUS | Status: AC
Start: 2020-04-29 — End: 2020-04-29
  Filled 2020-04-29: qty 250

## 2020-04-29 MED ORDER — AMLODIPINE BESYLATE 5 MG PO TABS
10.0000 mg | ORAL_TABLET | Freq: Once | ORAL | Status: AC
  Administered 2020-04-29: 10 mg via ORAL
  Filled 2020-04-29: qty 2

## 2020-04-29 MED ORDER — DEXTROSE 5 % IV SOLN
Freq: Once | INTRAVENOUS | Status: AC
  Filled 2020-04-29: qty 250

## 2020-04-29 MED ORDER — ATROPINE SULFATE 1 MG/ML IJ SOLN
0.5000 mg | Freq: Once | INTRAMUSCULAR | Status: AC
  Administered 2020-04-29: 0.5 mg via INTRAVENOUS

## 2020-04-29 MED ORDER — HEPARIN SOD (PORK) LOCK FLUSH 100 UNIT/ML IV SOLN
500.0000 [IU] | Freq: Once | INTRAVENOUS | Status: DC
  Filled 2020-04-29: qty 5

## 2020-04-29 MED ORDER — PALONOSETRON HCL INJECTION 0.25 MG/5ML
0.2500 mg | Freq: Once | INTRAVENOUS | Status: AC
Start: 2020-04-29 — End: 2020-04-29
  Administered 2020-04-29: 0.25 mg via INTRAVENOUS
  Filled 2020-04-29: qty 5

## 2020-04-29 MED ORDER — SODIUM CHLORIDE 0.9 % IV SOLN: Freq: Every day | INTRAVENOUS | Status: DC | PRN

## 2020-04-29 MED ORDER — HYDRALAZINE HCL 20 MG/ML IJ SOLN
10.0000 mg | Freq: Once | INTRAMUSCULAR | Status: AC
  Administered 2020-04-29: 10 mg via INTRAVENOUS
  Filled 2020-04-29: qty 1

## 2020-04-29 MED ORDER — SODIUM CHLORIDE 0.9 % IV SOLN
150.0000 mg | Freq: Once | INTRAVENOUS | Status: AC
  Administered 2020-04-29: 150 mg via INTRAVENOUS
  Filled 2020-04-29: qty 150

## 2020-04-29 NOTE — Progress Notes (Signed)
Patient BP was elevated at 205/108. Per Dr. Rogue Bussing give IV hydralazine.  BP rechecked. Ok to proceed with treatment today per Dr. Rogue Bussing with a BP of 185/82 and HR of 105.  During irinotecan infusion patient mentioned leg heaviness and feeling weak. She mentions that these symptoms occur every time she receives irinotecan and typically goes away after treatment has completed.  BP has remained unchanged. Lauren NP notified. No new orders. Continue treatment.

## 2020-04-29 NOTE — Progress Notes (Signed)
[place] NOTE  Patient Care Team: [place] as PCP - General [name], [place], FNP (Family Medicine) Rico Junker, RN as Registered Nurse Theodore Demark, RN as Registered Nurse Clent Jacks, RN as Oncology Nurse Navigator Stark Klein, MD as Consulting Physician (General Surgery)  CHIEF COMPLAINTS/PURPOSE OF CONSULTATION: pancreas adenocarcinoma   Oncology History Overview Note  #Pancreas adenocarcinoma-Stage IB- uT2uNxuMx-[EUS- Dr.Spaete; [facility]/GI; mass 22x42mm; invading/abutting superior mesenteric vein; 2 enlarged lymph nodes peripancreatic/porta hepatis largest 10 x 5.8 mm-nonpathologic based on EUS criteria/no biopsy]-borderline resectable.  PET scan no evidence of distant metastatic disease.  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 Stevan Born September]  # 01/02/2020-FOLFIRINOX; Neulasta.  # Borderline DM; HTN   # SURVIVORSHIP:   # GENETICS:   DIAGNOSIS: Pancreatic cancer  STAGE:  IB/borderline resectable;  GOALS: cure  CURRENT/MOST RECENT THERAPY : Neoadjuvant-FOLFIRINOX    Cancer of head of pancreas ([place])  12/04/2019 Initial Diagnosis   Cancer of head of pancreas ([place])   12/19/2019 - 12/19/2019 Chemotherapy   The patient had PACLitaxel-protein bound (ABRAXANE) chemo infusion 250 mg, 125 mg/m2 = 250 mg, Intravenous,  Once, 1 of 4 cycles Administration: 250 mg (12/19/2019) gemcitabine (GEMZAR) 2,000 mg in sodium chloride 0.9 % 250 mL chemo infusion, 1,976 mg, Intravenous,  Once, 1 of 4 cycles Administration: 2,000 mg (12/19/2019)  for chemotherapy treatment.    01/02/2020 -  Chemotherapy    Patient is on Treatment Plan: PANCREAS MODIFIED FOLFIRINOX Q14D X 4 CYCLES       HISTORY OF PRESENTING ILLNESS:  Robin Daniels 65 y.o.  female borderline resectable pancreatic adeno ca currently  on neoadjuvant chemotherapy with FOLFIRINOX is here for a follow up.  Patient states that she did not take her blood pressure medication yesterday as she overslept.  She however confirmed that she has the amlodipine/metoprolol medication.  Also states that she is unable to see the surgeon until February 25.  Mild tingling and numbness in extremities.  No nausea vomiting.  No fevers or chills.  No headaches.  No chest pain.  Review of Systems  Constitutional: Positive for weight loss. Negative for chills, diaphoresis, fever and malaise/fatigue.  HENT: Negative for nosebleeds and sore throat.   Eyes: Negative for double vision.  Respiratory: Negative for cough, hemoptysis, sputum production, shortness of breath and wheezing.   Cardiovascular: Negative for chest pain, palpitations, orthopnea and leg swelling.  Gastrointestinal: Negative for abdominal pain, blood in stool, constipation, diarrhea, heartburn, melena, nausea and vomiting.  Genitourinary: Negative for dysuria, frequency and urgency.  Musculoskeletal: Negative for back pain and joint pain.  Skin: Negative.  Negative for itching and rash.  Neurological: Positive for tingling. Negative for dizziness, focal weakness, weakness and headaches.  Endo/Heme/Allergies: Does not bruise/bleed easily.  Psychiatric/Behavioral: Negative for depression. The patient is not nervous/anxious and does not have insomnia.      MEDICAL HISTORY:  Past Medical History:  Diagnosis Date  . Anemia    history of  . Anxiety 09/11/2014  . Arthritis   . Cancer of head of pancreas ([place]) 12/04/2019  . Diabetes mellitus without complication ([place])   . Fluttering heart   . Hyperlipidemia   . Hypertension     SURGICAL HISTORY: Past Surgical History:  Procedure Laterality Date  . ENDOSCOPIC RETROGRADE CHOLANGIOPANCREATOGRAPHY (ERCP) WITH PROPOFOL N/A 11/16/2019   Procedure: ENDOSCOPIC RETROGRADE CHOLANGIOPANCREATOGRAPHY (ERCP) WITH PROPOFOL;  Surgeon: Lucilla Lame, MD;  Location: [facility] ENDOSCOPY;  Service: Endoscopy;  Laterality: N/A;  . ERCP N/A 03/27/2020   Procedure: ENDOSCOPIC RETROGRADE CHOLANGIOPANCREATOGRAPHY (ERCP);  Surgeon: Lucilla Lame, MD;  Location: [facility] ENDOSCOPY;  Service: Endoscopy;  Laterality: N/A;  . EUS N/A 11/29/2019   Procedure: FULL UPPER ENDOSCOPIC ULTRASOUND (EUS) RADIAL;  Surgeon: Reita Cliche, MD;  Location: [facility] ENDOSCOPY;  Service: Gastroenterology;  Laterality: N/A;  . IR IMAGING GUIDED PORT INSERTION  12/14/2019  . right knee replacement Right [id number]  . SHOULDER ARTHROSCOPY W/ ROTATOR CUFF REPAIR Right     SOCIAL HISTORY: Social History   Socioeconomic History  . Marital status: Divorced    Spouse name: Not on file  . Number of children: Not on file  . Years of education: Not on file  . Highest education level: Not on file  Occupational History  . Not on file  Tobacco Use  . Smoking status: Never Smoker  . Smokeless tobacco: Never Used  Vaping Use  . Vaping Use: Never used  Substance and Sexual Activity  . Alcohol use: No    Alcohol/week: 0.0 standard drinks  . Drug use: No  . Sexual activity: Never  Other Topics Concern  . 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    Social Determinants of Health   Financial Resource Strain: Not on file  Food Insecurity: Not on file  Transportation Needs: Not on file  Physical Activity: Not on file  Stress: Not on file  Social Connections: Not on file  Intimate Partner Violence: Not on file    FAMILY HISTORY: Family History  Problem Relation Age of Onset  . Diabetes Mother   . Hyperlipidemia Mother   . Hypertension Mother   . Vision loss Mother   . Arthritis Father   . Hyperlipidemia Father   . Hypertension Father   . Breast cancer Maternal Aunt     ALLERGIES:  has No Known Allergies.  MEDICATIONS:  Current Outpatient Medications  Medication Sig Dispense  Refill  . amLODipine (NORVASC) 10 MG tablet Take 10 mg by mouth daily.     Marland Kitchen lidocaine-prilocaine (EMLA) cream Apply 1 application topically once.    . metoprolol succinate (TOPROL-XL) 50 MG 24 hr tablet Take 50 mg by mouth daily. Take with or immediately following a meal.    . Potassium Chloride ER 20 MEQ TBCR Take 20 mEq by mouth in the morning and at bedtime. 60 tablet 1  . vitamin B-12 (CYANOCOBALAMIN) 1000 MCG tablet Take 1 tablet (1,000 mcg total) by mouth daily.    . diphenoxylate-atropine (LOMOTIL) 2.5-0.025 MG tablet Take 1 tablet by mouth 4 (four) times daily as needed for diarrhea or loose stools. Take it along with immodium (Patient not taking: No sig reported) 45 tablet 0   No current facility-administered medications for this visit.   Facility-Administered Medications Ordered in Other Visits  Medication Dose Route Frequency Provider Last Rate Last Admin  . heparin lock flush 100 unit/mL  500 Units Intravenous Once Cammie Sickle, MD          .  PHYSICAL EXAMINATION: ECOG PERFORMANCE STATUS: 1 - Symptomatic but completely ambulatory  Vitals:   04/29/20 0834  Resp: 20   Filed Weights   04/29/20 0834  Weight: 186 lb (84.4 kg)    Physical Exam HENT:     Head: Normocephalic and atraumatic.     Mouth/Throat:  Pharynx: No oropharyngeal exudate.  Eyes:     Pupils: Pupils are equal, round, and reactive to light.  Cardiovascular:     Rate and Rhythm: Normal rate and regular rhythm.  Pulmonary:     Effort: Pulmonary effort is normal. No respiratory distress.     Breath sounds: Normal breath sounds. No wheezing.  Abdominal:     General: Bowel sounds are normal. There is no distension.     Palpations: Abdomen is soft. There is no mass.     Tenderness: There is no abdominal tenderness. There is no guarding or rebound.  Musculoskeletal:        General: No tenderness. Normal range of motion.     Cervical back: Normal range of motion and neck supple.  Skin:     General: Skin is warm.  Neurological:     Mental Status: She is alert and oriented to person, place, and time.  Psychiatric:        Mood and Affect: Affect normal.      LABORATORY DATA:  I have reviewed the data as listed Lab Results  Component Value Date   WBC 5.2 04/15/2020   HGB 9.4 (L) 04/15/2020   HCT 30.0 (L) 04/15/2020   MCV 78.1 (L) 04/15/2020   PLT 204 04/15/2020   Recent Labs    11/21/19 1449 12/04/19 1327 12/19/19 0833 01/02/20 0831 03/18/20 0816 04/01/20 0906 04/15/20 1007  NA 133* 136 139   < > 136 136 138  K 4.2 3.6 3.2*   < > 3.0* 3.1* 3.5  CL 91* 102 102   < > 104 100 104  CO2 30 26 26    < > 23 26 25   GLUCOSE 169* 126* 168*   < > 131* 139* 152*  BUN 15 7* 8   < > 8 6* 5*  CREATININE 1.02* 0.90 0.89   < > 0.89 0.83 0.81  CALCIUM 9.0 8.7* 8.9   < > 8.7* 9.0 9.1  GFRNONAA 57* >60 >60   < > >60 >60 >60  GFRAA >60 >60 >60  --   --   --   --   PROT 8.3* 7.3 7.6   < > 7.3 7.4 7.6  ALBUMIN 3.4* 3.5 3.5   < > 3.4* 3.4* 3.5  AST 45* 19 35   < > 28 30 36  ALT 137* 22 50*   < > 17 29 21   ALKPHOS 405* 156* 294*   < > 113 157* 153*  BILITOT 1.8* 0.9 0.7   < > 0.4 0.3 0.3   < > = values in this interval not displayed.    RADIOGRAPHIC STUDIES: I have personally reviewed the radiological images as listed and agreed with the findings in the report. No results found.  ASSESSMENT & PLAN:   No problem-specific Assessment & Plan notes found for this encounter.  All questions were answered. The patient knows to call the clinic with any problems, questions or concerns.    Cammie Sickle, MD 04/29/2020 8:38 AM

## 2020-04-29 NOTE — Assessment & Plan Note (Addendum)
#    Pancreas adenocarcinoma-Stage IB-/boderline resectable ;Given the abutment of the SMV;  neoadjuvant chemotherapy- on FOLFIRINX. S/p 5 cycles of chemo-CT  12/17-ill-defined pancreatic lesion similar size; possible mild abutment of the portal vein/proximal SMV.  Significant improvement of the cystic lesion adjacent to the neck of pancreas [currently 9 mm previously 2.5 cm]; slight increase in size of the peripancreatic lymph node [<cm in size].  Tumor marker normalized. Unable to keep appt with Dr.Byerly sec to transportation issues. Awaiting appt on Feb 25th in [place].   # proceed with cycle #9   Continue FOLFIRINOX Labs today reviewed;  acceptable for treatment.CA 19-9 improving.  Continue with chemotherapy; until plans for surgery finalized.  Awaiting chemistries.  # Mucositis- sec chemotherapy-grade 1 through 2.  Continue salt and baking soda rinses.   # Hypertension-poorly controlled [200/92] recommend compliance with medications.discussed the risk of MI/Strokes if not well controlled. Continue Norvasc/Metoprlol.  Recommend Norvasc x1 in the clinic today.  If not improved recommend hydralazine.  # Hypokalemia-today potassium is 3.5. Continue Kdur 20 BID.  # Myalgias/Jaw discomfort/PN- G-1-2;--STABLE;? oxaliplatin-Irinotecan AEs; plan pre-and post atropine.   # mediport malfunction: flushing; not drawing; NOV- dye study shows a fibrin sheath.     # DISPOSITION:  # FOLFIRONOX today;  pump off d-3/udenyca # in 2 weeks-MD; labs- cbc.cmp;ca-19-9; possible KCL over 1 hour; FOLFIRINOX; d-3 pump-OFF;udenyca;Dr.B

## 2020-04-30 LAB — CANCER ANTIGEN 19-9: CA 19-9: 22 U/mL (ref 0–35)

## 2020-05-01 ENCOUNTER — Inpatient Hospital Stay: Payer: Medicare Other

## 2020-05-01 DIAGNOSIS — C250 Malignant neoplasm of head of pancreas: Secondary | ICD-10-CM

## 2020-05-01 DIAGNOSIS — Z5111 Encounter for antineoplastic chemotherapy: Secondary | ICD-10-CM | POA: Diagnosis not present

## 2020-05-01 MED ORDER — PEGFILGRASTIM-BMEZ 6 MG/0.6ML ~~LOC~~ SOSY
6.0000 mg | PREFILLED_SYRINGE | Freq: Once | SUBCUTANEOUS | Status: AC
  Administered 2020-05-01: 6 mg via SUBCUTANEOUS
  Filled 2020-05-01: qty 0.6

## 2020-05-01 MED ORDER — HEPARIN SOD (PORK) LOCK FLUSH 100 UNIT/ML IV SOLN
500.0000 [IU] | Freq: Once | INTRAVENOUS | Status: AC | PRN
  Administered 2020-05-01: 500 [IU]
  Filled 2020-05-01: qty 5

## 2020-05-01 MED ORDER — SODIUM CHLORIDE 0.9% FLUSH
10.0000 mL | INTRAVENOUS | Status: DC | PRN
  Administered 2020-05-01: 10 mL
  Filled 2020-05-01: qty 10

## 2020-05-01 MED ORDER — HEPARIN SOD (PORK) LOCK FLUSH 100 UNIT/ML IV SOLN
INTRAVENOUS | Status: AC
  Filled 2020-05-01: qty 5

## 2020-05-13 ENCOUNTER — Encounter: Payer: Self-pay | Admitting: Internal Medicine

## 2020-05-13 ENCOUNTER — Inpatient Hospital Stay (HOSPITAL_BASED_OUTPATIENT_CLINIC_OR_DEPARTMENT_OTHER): Payer: Medicare Other | Admitting: Internal Medicine

## 2020-05-13 ENCOUNTER — Inpatient Hospital Stay: Payer: Medicare Other

## 2020-05-13 VITALS — BP 190/92 | HR 111 | Resp 18

## 2020-05-13 VITALS — BP 209/99 | HR 107 | Temp 98.1°F | Resp 16 | Ht 63.0 in | Wt 189.0 lb

## 2020-05-13 DIAGNOSIS — C250 Malignant neoplasm of head of pancreas: Secondary | ICD-10-CM | POA: Diagnosis not present

## 2020-05-13 DIAGNOSIS — Z5111 Encounter for antineoplastic chemotherapy: Secondary | ICD-10-CM | POA: Diagnosis not present

## 2020-05-13 DIAGNOSIS — I10 Essential (primary) hypertension: Secondary | ICD-10-CM

## 2020-05-13 DIAGNOSIS — E876 Hypokalemia: Secondary | ICD-10-CM

## 2020-05-13 LAB — COMPREHENSIVE METABOLIC PANEL
ALT: 20 U/L (ref 0–44)
AST: 30 U/L (ref 15–41)
Albumin: 3.2 g/dL — ABNORMAL LOW (ref 3.5–5.0)
Alkaline Phosphatase: 152 U/L — ABNORMAL HIGH (ref 38–126)
Anion gap: 12 (ref 5–15)
BUN: 5 mg/dL — ABNORMAL LOW (ref 8–23)
CO2: 25 mmol/L (ref 22–32)
Calcium: 8.9 mg/dL (ref 8.9–10.3)
Chloride: 101 mmol/L (ref 98–111)
Creatinine, Ser: 0.68 mg/dL (ref 0.44–1.00)
GFR, Estimated: >60 mL/min (ref >60)
Glucose, Bld: 145 mg/dL — ABNORMAL HIGH (ref 70–99)
Potassium: 3.3 mmol/L — ABNORMAL LOW (ref 3.5–5.1)
Sodium: 138 mmol/L (ref 135–145)
Total Bilirubin: 0.4 mg/dL (ref 0.3–1.2)
Total Protein: 7.6 g/dL (ref 6.5–8.1)

## 2020-05-13 LAB — CBC WITH DIFFERENTIAL/PLATELET
Abs Immature Granulocytes: 0.18 10*3/uL — ABNORMAL HIGH (ref 0.00–0.07)
Basophils Absolute: 0 10*3/uL (ref 0.0–0.1)
Basophils Relative: 1 %
Eosinophils Absolute: 0 10*3/uL (ref 0.0–0.5)
Eosinophils Relative: 1 %
HCT: 26.6 % — ABNORMAL LOW (ref 36.0–46.0)
Hemoglobin: 8.4 g/dL — ABNORMAL LOW (ref 12.0–15.0)
Immature Granulocytes: 4 %
Lymphocytes Relative: 32 %
Lymphs Abs: 1.3 10*3/uL (ref 0.7–4.0)
MCH: 25.3 pg — ABNORMAL LOW (ref 26.0–34.0)
MCHC: 31.6 g/dL (ref 30.0–36.0)
MCV: 80.1 fL (ref 80.0–100.0)
Monocytes Absolute: 0.6 10*3/uL (ref 0.1–1.0)
Monocytes Relative: 15 %
Neutro Abs: 1.9 10*3/uL (ref 1.7–7.7)
Neutrophils Relative %: 47 %
Platelets: 139 10*3/uL — ABNORMAL LOW (ref 150–400)
RBC: 3.32 MIL/uL — ABNORMAL LOW (ref 3.87–5.11)
RDW: 19.7 % — ABNORMAL HIGH (ref 11.5–15.5)
WBC: 4.1 10*3/uL (ref 4.0–10.5)
nRBC: 0 % (ref 0.0–0.2)

## 2020-05-13 MED ORDER — ATROPINE SULFATE 1 MG/ML IJ SOLN
INTRAMUSCULAR | Status: AC
  Filled 2020-05-13: qty 1

## 2020-05-13 MED ORDER — POTASSIUM CHLORIDE 20 MEQ/100ML IV SOLN
20.0000 meq | Freq: Once | INTRAVENOUS | Status: AC
  Administered 2020-05-13: 20 meq via INTRAVENOUS

## 2020-05-13 MED ORDER — PALONOSETRON HCL INJECTION 0.25 MG/5ML
0.2500 mg | Freq: Once | INTRAVENOUS | Status: AC
  Administered 2020-05-13: 0.25 mg via INTRAVENOUS
  Filled 2020-05-13: qty 5

## 2020-05-13 MED ORDER — SODIUM CHLORIDE 0.9 % IV SOLN
150.0000 mg | Freq: Once | INTRAVENOUS | Status: AC
  Administered 2020-05-13: 150 mg via INTRAVENOUS
  Filled 2020-05-13: qty 150

## 2020-05-13 MED ORDER — OXALIPLATIN CHEMO INJECTION 100 MG/20ML
85.0000 mg/m2 | Freq: Once | INTRAVENOUS | Status: AC
  Administered 2020-05-13: 165 mg via INTRAVENOUS
  Filled 2020-05-13: qty 33

## 2020-05-13 MED ORDER — SODIUM CHLORIDE 0.9 % IV SOLN
150.0000 mg/m2 | Freq: Once | INTRAVENOUS | Status: AC
  Administered 2020-05-13: 300 mg via INTRAVENOUS
  Filled 2020-05-13: qty 15

## 2020-05-13 MED ORDER — DEXTROSE 5 % IV SOLN
Freq: Once | INTRAVENOUS | Status: AC
  Filled 2020-05-13: qty 250

## 2020-05-13 MED ORDER — SODIUM CHLORIDE 0.9 % IV SOLN
10.0000 mg | Freq: Once | INTRAVENOUS | Status: AC
  Administered 2020-05-13: 10 mg via INTRAVENOUS
  Filled 2020-05-13: qty 10

## 2020-05-13 MED ORDER — SODIUM CHLORIDE 0.9 % IV SOLN
2400.0000 mg/m2 | INTRAVENOUS | Status: DC
  Administered 2020-05-13: 4700 mg via INTRAVENOUS
  Filled 2020-05-13: qty 94

## 2020-05-13 MED ORDER — ATROPINE SULFATE 1 MG/ML IJ SOLN
0.5000 mg | Freq: Once | INTRAMUSCULAR | Status: AC
  Administered 2020-05-13: 0.5 mg via INTRAVENOUS
  Filled 2020-05-13: qty 1

## 2020-05-13 MED ORDER — SODIUM CHLORIDE 0.9 % IV SOLN
Freq: Once | INTRAVENOUS | Status: AC
  Filled 2020-05-13: qty 250

## 2020-05-13 MED ORDER — SODIUM CHLORIDE 0.9 % IV SOLN
800.0000 mg | Freq: Once | INTRAVENOUS | Status: AC
  Administered 2020-05-13: 800 mg via INTRAVENOUS
  Filled 2020-05-13: qty 25

## 2020-05-13 MED ORDER — ATROPINE SULFATE 1 MG/ML IJ SOLN
0.5000 mg | Freq: Once | INTRAMUSCULAR | Status: AC
  Administered 2020-05-13: 0.5 mg via INTRAVENOUS

## 2020-05-13 MED ORDER — HYDRALAZINE HCL 20 MG/ML IJ SOLN
20.0000 mg | Freq: Once | INTRAMUSCULAR | Status: AC
  Administered 2020-05-13: 20 mg via INTRAVENOUS
  Filled 2020-05-13: qty 1

## 2020-05-13 MED ORDER — HYDRALAZINE HCL 20 MG/ML IJ SOLN: 20.0000 mg | Freq: Once | INTRAMUSCULAR | Status: DC

## 2020-05-13 MED ORDER — SODIUM CHLORIDE 0.9 % IV SOLN
Freq: Every day | INTRAVENOUS | Status: DC | PRN
  Filled 2020-05-13: qty 10

## 2020-05-13 NOTE — Progress Notes (Signed)
BP 209/99 HR 107. Per Dr. Rogue Bussing, patient to receive Hydralazine and once systolic drops to 929W, she can proceed with treatment.   BP 179/78 HR 103, Per Dr. Rogue Bussing, okay to proceed  Final BP 190/92, HR 111. Per Dr. Rogue Bussing, okay to discharge home and to inform patient to keep a check on BP at home and contact clinic if it remains elevated. Educated patient. Patient verbalizes understanding and denies any questions and concerns.

## 2020-05-13 NOTE — Progress Notes (Signed)
[facility] NOTE  Patient Care Team: [place] as PCP - General [name], Dunellen, FNP (Family Medicine) Rico Junker, RN as Registered Nurse Theodore Demark, RN as Registered Nurse Clent Jacks, RN as Oncology Nurse Navigator Stark Klein, MD as Consulting Physician (General Surgery)  CHIEF COMPLAINTS/PURPOSE OF CONSULTATION: pancreas adenocarcinoma   Oncology History Overview Note  #Pancreas adenocarcinoma-Stage IB- [id number]-[EUS- Dr.Spaete; [facility]/GI; mass 22x23mm; invading/abutting superior mesenteric vein; 2 enlarged lymph nodes peripancreatic/porta hepatis largest 10 x 5.8 mm-nonpathologic based on EUS criteria/no biopsy]-borderline resectable.  PET scan no evidence of distant metastatic disease.  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 Stevan Born September]  # 01/02/2020-FOLFIRINOX; Neulasta.  # Borderline DM; HTN   # SURVIVORSHIP:   # GENETICS:   DIAGNOSIS: Pancreatic cancer  STAGE:  IB/borderline resectable;  GOALS: cure  CURRENT/MOST RECENT THERAPY : Neoadjuvant-FOLFIRINOX    Cancer of head of pancreas ([place])  12/04/2019 Initial Diagnosis   Cancer of head of pancreas ([place])   12/19/2019 - 12/19/2019 Chemotherapy   The patient had PACLitaxel-protein bound (ABRAXANE) chemo infusion 250 mg, 125 mg/m2 = 250 mg, Intravenous,  Once, 1 of 4 cycles Administration: 250 mg (12/19/2019) gemcitabine (GEMZAR) 2,000 mg in sodium chloride 0.9 % 250 mL chemo infusion, 1,976 mg, Intravenous,  Once, 1 of 4 cycles Administration: 2,000 mg (12/19/2019)  for chemotherapy treatment.    01/02/2020 -  Chemotherapy    Patient is on Treatment Plan: PANCREAS MODIFIED FOLFIRINOX Q14D X 4 CYCLES       HISTORY OF PRESENTING ILLNESS:  Robin Daniels 65 y.o.  female borderline resectable pancreatic adeno ca currently  on neoadjuvant chemotherapy with FOLFIRINOX is here for a follow up.  Patient denies any worsening headaches.  She states to be compliant with her blood pressure medications.  States her blood pressure at home has been in systolic 400Q.  However does not check her blood pressure on a daily  basis.  Mild tingling and numbness in extremities.  Awaiting to see surgery on 25 February.    Review of Systems  Constitutional: Positive for weight loss. Negative for chills, diaphoresis, fever and malaise/fatigue.  HENT: Negative for nosebleeds and sore throat.   Eyes: Negative for double vision.  Respiratory: Negative for cough, hemoptysis, sputum production, shortness of breath and wheezing.   Cardiovascular: Negative for chest pain, palpitations, orthopnea and leg swelling.  Gastrointestinal: Negative for abdominal pain, blood in stool, constipation, diarrhea, heartburn, melena, nausea and vomiting.  Genitourinary: Negative for dysuria, frequency and urgency.  Musculoskeletal: Negative for back pain and joint pain.  Skin: Negative.  Negative for itching and rash.  Neurological: Positive for tingling. Negative for dizziness, focal weakness, weakness and headaches.  Endo/Heme/Allergies: Does not bruise/bleed easily.  Psychiatric/Behavioral: Negative for depression. The patient is not nervous/anxious and does not have insomnia.      MEDICAL HISTORY:  Past Medical History:  Diagnosis Date  . Anemia    history of  . Anxiety 09/11/2014  . Arthritis   . Cancer of head of pancreas ([place]) 12/04/2019  . Diabetes mellitus without complication ([place])   . Fluttering heart   . Hyperlipidemia   . Hypertension     SURGICAL HISTORY: Past Surgical History:  Procedure Laterality Date  . ENDOSCOPIC RETROGRADE CHOLANGIOPANCREATOGRAPHY (ERCP) WITH PROPOFOL N/A 11/16/2019   Procedure: ENDOSCOPIC RETROGRADE CHOLANGIOPANCREATOGRAPHY (ERCP) WITH PROPOFOL;  Surgeon: Lucilla Lame, MD;  Location: [facility] ENDOSCOPY;   Service: Endoscopy;  Laterality: N/A;  . ERCP N/A 03/27/2020   Procedure: ENDOSCOPIC RETROGRADE CHOLANGIOPANCREATOGRAPHY (ERCP);  Surgeon: Lucilla Lame, MD;  Location: [facility] ENDOSCOPY;  Service: Endoscopy;  Laterality: N/A;  . EUS N/A 11/29/2019   Procedure: FULL UPPER ENDOSCOPIC ULTRASOUND (EUS) RADIAL;  Surgeon: Reita Cliche, MD;  Location: [facility] ENDOSCOPY;  Service: Gastroenterology;  Laterality: N/A;  . IR IMAGING GUIDED PORT INSERTION  12/14/2019  . right knee replacement Right [id number]  . SHOULDER ARTHROSCOPY W/ ROTATOR CUFF REPAIR Right     SOCIAL HISTORY: Social History   Socioeconomic History  . Marital status: Divorced    Spouse name: Not on file  . Number of children: Not on file  . Years of education: Not on file  . Highest education level: Not on file  Occupational History  . Not on file  Tobacco Use  . Smoking status: Never Smoker  . Smokeless tobacco: Never Used  Vaping Use  . Vaping Use: Never used  Substance and Sexual Activity  . Alcohol use: No    Alcohol/week: 0.0 standard drinks  . Drug use: No  . Sexual activity: Never  Other Topics Concern  . Not on file  Social History Narrative   Lives in [place]; with mom and son; worked in dietary; never smoked; no alcohol.    Social Determinants of Health   Financial Resource Strain: Not on file  Food Insecurity: Not on file  Transportation Needs: Not on file  Physical Activity: Not on file  Stress: Not on file  Social Connections: Not on file  Intimate Partner Violence: Not on file    FAMILY HISTORY: Family History  Problem Relation Age of Onset  . Diabetes Mother   . Hyperlipidemia Mother   . Hypertension Mother   . Vision loss Mother   . Arthritis Father   . Hyperlipidemia Father   . Hypertension Father   . Breast cancer Maternal Aunt     ALLERGIES:  has No Known Allergies.  MEDICATIONS:  Current Outpatient Medications  Medication Sig Dispense Refill  . lidocaine-prilocaine (EMLA) cream  Apply topically once.    . metoprolol succinate (TOPROL-XL) 50 MG 24 hr tablet Take 50 mg by mouth daily. Take with or immediately following a meal.    . Potassium Chloride ER 20 MEQ TBCR Take 20 mEq by mouth in the morning and at bedtime. 60 tablet 1  . vitamin B-12 (CYANOCOBALAMIN) 1000 MCG tablet Take 1 tablet (1,000 mcg total) by mouth daily.    Marland Kitchen amLODipine (NORVASC) 10 MG tablet Take 10 mg by mouth daily.  (Patient not taking: No sig reported)    . diphenoxylate-atropine (LOMOTIL) 2.5-0.025 MG tablet Take 1 tablet by mouth 4 (four) times daily as needed for diarrhea or loose stools. Take it along with immodium (Patient not taking: No sig reported) 45 tablet 0   No current facility-administered medications for this visit.   Facility-Administered Medications Ordered in Other Visits  Medication Dose Route Frequency Provider Last Rate Last Admin  . atropine injection 0.5 mg  0.5 mg Intravenous Once Charlaine Dalton R, MD      . atropine injection 0.5 mg  0.5 mg Intravenous Once Charlaine Dalton R, MD      . dexamethasone (DECADRON) 10 mg in sodium chloride 0.9 % 50 mL IVPB  10 mg Intravenous Once Charlaine Dalton R, MD      . dextrose 5 % solution   Intravenous Once Cammie Sickle, MD      .  fluorouracil (ADRUCIL) 4,700 mg in sodium chloride 0.9 % 56 mL chemo infusion  2,400 mg/m2 (Treatment Plan Recorded) Intravenous 1 day or 1 dose Charlaine Dalton R, MD      . fosaprepitant (EMEND) 150 mg in sodium chloride 0.9 % 145 mL IVPB  150 mg Intravenous Once Charlaine Dalton R, MD      . irinotecan (CAMPTOSAR) 300 mg in sodium chloride 0.9 % 500 mL chemo infusion  150 mg/m2 (Treatment Plan Recorded) Intravenous Once Charlaine Dalton R, MD      . leucovorin 800 mg in sodium chloride 0.9 % 250 mL infusion  800 mg Intravenous Once Charlaine Dalton R, MD      . oxaliplatin (ELOXATIN) 165 mg in dextrose 5 % 500 mL chemo infusion  85 mg/m2 (Treatment Plan Recorded) Intravenous  Once Charlaine Dalton R, MD      . palonosetron (ALOXI) injection 0.25 mg  0.25 mg Intravenous Once Charlaine Dalton R, MD      . potassium chloride 20 mEq in 100 mL IVPB  20 mEq Intravenous Once Earlie Server, MD 100 mL/hr at 05/13/20 1046 20 mEq at 05/13/20 1046      .  PHYSICAL EXAMINATION: ECOG PERFORMANCE STATUS: 1 - Symptomatic but completely ambulatory  Vitals:   05/13/20 0917  BP: (!) 209/99  Pulse: (!) 107  Resp: 16  Temp: 98.1 F (36.7 C)  SpO2: 100%   Filed Weights   05/13/20 0917  Weight: 189 lb (85.7 kg)    Physical Exam HENT:     Head: Normocephalic and atraumatic.     Mouth/Throat:     Pharynx: No oropharyngeal exudate.  Eyes:     Pupils: Pupils are equal, round, and reactive to light.  Cardiovascular:     Rate and Rhythm: Normal rate and regular rhythm.  Pulmonary:     Effort: Pulmonary effort is normal. No respiratory distress.     Breath sounds: Normal breath sounds. No wheezing.  Abdominal:     General: Bowel sounds are normal. There is no distension.     Palpations: Abdomen is soft. There is no mass.     Tenderness: There is no abdominal tenderness. There is no guarding or rebound.  Musculoskeletal:        General: No tenderness. Normal range of motion.     Cervical back: Normal range of motion and neck supple.  Skin:    General: Skin is warm.  Neurological:     Mental Status: She is alert and oriented to person, place, and time.  Psychiatric:        Mood and Affect: Affect normal.      LABORATORY DATA:  I have reviewed the data as listed Lab Results  Component Value Date   WBC 4.1 05/13/2020   HGB 8.4 (L) 05/13/2020   HCT 26.6 (L) 05/13/2020   MCV 80.1 05/13/2020   PLT 139 (L) 05/13/2020   Recent Labs    11/21/19 1449 12/04/19 1327 12/19/19 0833 01/02/20 0831 04/15/20 1007 04/29/20 0806 05/13/20 0852  NA 133* 136 139   < > 138 137 138  K 4.2 3.6 3.2*   < > 3.5 3.1* 3.3*  CL 91* 102 102   < > 104 102 101  CO2 30 26 26     < > 25 24 25   GLUCOSE 169* 126* 168*   < > 152* 138* 145*  BUN 15 7* 8   < > 5* 7* 5*  CREATININE 1.02* 0.90 0.89   < >  0.81 0.75 0.68  CALCIUM 9.0 8.7* 8.9   < > 9.1 9.0 8.9  GFRNONAA 57* >60 >60   < > >60 >60 >60  GFRAA >60 >60 >60  --   --   --   --   PROT 8.3* 7.3 7.6   < > 7.6 7.5 7.6  ALBUMIN 3.4* 3.5 3.5   < > 3.5 3.4* 3.2*  AST 45* 19 35   < > 36 32 30  ALT 137* 22 50*   < > 21 18 20   ALKPHOS 405* 156* 294*   < > 153* 143* 152*  BILITOT 1.8* 0.9 0.7   < > 0.3 0.4 0.4   < > = values in this interval not displayed.    RADIOGRAPHIC STUDIES: I have personally reviewed the radiological images as listed and agreed with the findings in the report. No results found.  ASSESSMENT & PLAN:   Cancer of head of pancreas ([place]) #  Pancreas adenocarcinoma-Stage IB-/boderline resectable ;Given the abutment of the SMV;  neoadjuvant chemotherapy- on FOLFIRINX. S/p 5 cycles of chemo-CT  12/17-ill-defined pancreatic lesion similar size; possible mild abutment of the portal vein/proximal SMV.  Significant improvement of the cystic lesion adjacent to the neck of pancreas [currently 9 mm previously 2.5 cm]; slight increase in size of the peripancreatic lymph node [<cm in size].  Tumor marker normalized.  Awaiting appt on Feb 25th in [place].   # proceed with cycle #10   Continue FOLFIRINOX Labs today reviewed;  acceptable for treatment.CA 19-9 improving.  I would recommend surgery after this cycle after evaluation with surgery next week.  # Mucositis- sec chemotherapy-grade 1 through 2.  Continue salt and baking soda rinses.   # Hypertension-poorly controlled [202/99]; Continue Norvasc/Metoprlol.[ per pt At home- 140s]; bring log. ADD giving hydralazine 20 mg IVP x1.   # Hypokalemia-today potassium is 3.3. Continue Kdur 20 BID.  # Myalgias/Jaw discomfort/PN- G-1-2;--STABLE;? oxaliplatin-Irinotecan AEs; plan pre-and post atropine.   # mediport malfunction: flushing; not drawing; NOV- dye study  shows a fibrin sheath.     # DISPOSITION:  # FOLFIRONOX today;  pump off d-3/udenyca # in 2 weeks-MD; labs- cbc.cmp;ca-19-9; possible KCL over 1 hour; FOLFIRINOX; d-3 pump-OFF;udenyca;Dr.B   All questions were answered. The patient knows to call the clinic with any problems, questions or concerns.    Cammie Sickle, MD 05/13/2020 11:21 AM

## 2020-05-13 NOTE — Assessment & Plan Note (Addendum)
#    Pancreas adenocarcinoma-Stage IB-/boderline resectable ;Given the abutment of the SMV;  neoadjuvant chemotherapy- on FOLFIRINX. S/p 5 cycles of chemo-CT  12/17-ill-defined pancreatic lesion similar size; possible mild abutment of the portal vein/proximal SMV.  Significant improvement of the cystic lesion adjacent to the neck of pancreas [currently 9 mm previously 2.5 cm]; slight increase in size of the peripancreatic lymph node [<cm in size].  Tumor marker normalized.  Awaiting appt on Feb 25th in [place].   # proceed with cycle #10   Continue FOLFIRINOX Labs today reviewed;  acceptable for treatment.CA 19-9 improving.  I would recommend surgery after this cycle after evaluation with surgery next week.  # Mucositis- sec chemotherapy-grade 1 through 2.  Continue salt and baking soda rinses.   # Hypertension-poorly controlled [202/99]; Continue Norvasc/Metoprlol.[ per pt At home- 140s]; bring log. ADD giving hydralazine 20 mg IVP x1.   # Hypokalemia-today potassium is 3.3. Continue Kdur 20 BID.  # Myalgias/Jaw discomfort/PN- G-1-2;--STABLE;? oxaliplatin-Irinotecan AEs; plan pre-and post atropine.   # mediport malfunction: flushing; not drawing; NOV- dye study shows a fibrin sheath.     # DISPOSITION:  # FOLFIRONOX today;  pump off d-3/udenyca # in 2 weeks-MD; labs- cbc.cmp;ca-19-9; possible KCL over 1 hour; FOLFIRINOX; d-3 pump-OFF;udenyca;Dr.B

## 2020-05-14 LAB — CANCER ANTIGEN 19-9: CA 19-9: 19 U/mL (ref 0–35)

## 2020-05-15 ENCOUNTER — Inpatient Hospital Stay: Payer: Medicare Other

## 2020-05-15 VITALS — BP 178/98 | HR 103 | Resp 20

## 2020-05-15 DIAGNOSIS — C250 Malignant neoplasm of head of pancreas: Secondary | ICD-10-CM

## 2020-05-15 DIAGNOSIS — Z5111 Encounter for antineoplastic chemotherapy: Secondary | ICD-10-CM | POA: Diagnosis not present

## 2020-05-15 MED ORDER — PEGFILGRASTIM-BMEZ 6 MG/0.6ML ~~LOC~~ SOSY
6.0000 mg | PREFILLED_SYRINGE | Freq: Once | SUBCUTANEOUS | Status: AC
  Administered 2020-05-15: 6 mg via SUBCUTANEOUS
  Filled 2020-05-15: qty 0.6

## 2020-05-15 MED ORDER — SODIUM CHLORIDE 0.9% FLUSH
10.0000 mL | INTRAVENOUS | Status: DC | PRN
  Administered 2020-05-15: 10 mL
  Filled 2020-05-15: qty 10

## 2020-05-15 MED ORDER — HEPARIN SOD (PORK) LOCK FLUSH 100 UNIT/ML IV SOLN
500.0000 [IU] | Freq: Once | INTRAVENOUS | Status: AC | PRN
  Administered 2020-05-15: 500 [IU]
  Filled 2020-05-15: qty 5

## 2020-05-23 ENCOUNTER — Other Ambulatory Visit: Payer: Self-pay | Admitting: General Surgery

## 2020-05-27 ENCOUNTER — Inpatient Hospital Stay: Payer: Medicare Other

## 2020-05-27 ENCOUNTER — Inpatient Hospital Stay: Payer: Medicare Other | Attending: Oncology

## 2020-05-27 ENCOUNTER — Inpatient Hospital Stay (HOSPITAL_BASED_OUTPATIENT_CLINIC_OR_DEPARTMENT_OTHER): Payer: Medicare Other | Admitting: Internal Medicine

## 2020-05-27 DIAGNOSIS — K1231 Oral mucositis (ulcerative) due to antineoplastic therapy: Secondary | ICD-10-CM | POA: Insufficient documentation

## 2020-05-27 DIAGNOSIS — G620 Drug-induced polyneuropathy: Secondary | ICD-10-CM | POA: Insufficient documentation

## 2020-05-27 DIAGNOSIS — I10 Essential (primary) hypertension: Secondary | ICD-10-CM | POA: Insufficient documentation

## 2020-05-27 DIAGNOSIS — Z452 Encounter for adjustment and management of vascular access device: Secondary | ICD-10-CM | POA: Diagnosis not present

## 2020-05-27 DIAGNOSIS — C250 Malignant neoplasm of head of pancreas: Secondary | ICD-10-CM | POA: Diagnosis present

## 2020-05-27 DIAGNOSIS — Z95828 Presence of other vascular implants and grafts: Secondary | ICD-10-CM

## 2020-05-27 DIAGNOSIS — E876 Hypokalemia: Secondary | ICD-10-CM | POA: Diagnosis not present

## 2020-05-27 DIAGNOSIS — E119 Type 2 diabetes mellitus without complications: Secondary | ICD-10-CM | POA: Insufficient documentation

## 2020-05-27 LAB — CBC WITH DIFFERENTIAL/PLATELET
Abs Immature Granulocytes: 0.06 10*3/uL (ref 0.00–0.07)
Basophils Absolute: 0 10*3/uL (ref 0.0–0.1)
Basophils Relative: 1 %
Eosinophils Absolute: 0 10*3/uL (ref 0.0–0.5)
Eosinophils Relative: 1 %
HCT: 27.1 % — ABNORMAL LOW (ref 36.0–46.0)
Hemoglobin: 8.4 g/dL — ABNORMAL LOW (ref 12.0–15.0)
Immature Granulocytes: 2 %
Lymphocytes Relative: 34 %
Lymphs Abs: 1.2 10*3/uL (ref 0.7–4.0)
MCH: 25.8 pg — ABNORMAL LOW (ref 26.0–34.0)
MCHC: 31 g/dL (ref 30.0–36.0)
MCV: 83.4 fL (ref 80.0–100.0)
Monocytes Absolute: 0.6 10*3/uL (ref 0.1–1.0)
Monocytes Relative: 17 %
Neutro Abs: 1.7 10*3/uL (ref 1.7–7.7)
Neutrophils Relative %: 45 %
Platelets: 141 10*3/uL — ABNORMAL LOW (ref 150–400)
RBC: 3.25 MIL/uL — ABNORMAL LOW (ref 3.87–5.11)
RDW: 20.2 % — ABNORMAL HIGH (ref 11.5–15.5)
WBC: 3.7 10*3/uL — ABNORMAL LOW (ref 4.0–10.5)
nRBC: 0 % (ref 0.0–0.2)

## 2020-05-27 LAB — COMPREHENSIVE METABOLIC PANEL
ALT: 17 U/L (ref 0–44)
AST: 30 U/L (ref 15–41)
Albumin: 3.4 g/dL — ABNORMAL LOW (ref 3.5–5.0)
Alkaline Phosphatase: 167 U/L — ABNORMAL HIGH (ref 38–126)
Anion gap: 13 (ref 5–15)
BUN: 6 mg/dL — ABNORMAL LOW (ref 8–23)
CO2: 26 mmol/L (ref 22–32)
Calcium: 8.9 mg/dL (ref 8.9–10.3)
Chloride: 99 mmol/L (ref 98–111)
Creatinine, Ser: 0.66 mg/dL (ref 0.44–1.00)
GFR, Estimated: >60 mL/min (ref >60)
Glucose, Bld: 126 mg/dL — ABNORMAL HIGH (ref 70–99)
Potassium: 3.4 mmol/L — ABNORMAL LOW (ref 3.5–5.1)
Sodium: 138 mmol/L (ref 135–145)
Total Bilirubin: 0.5 mg/dL (ref 0.3–1.2)
Total Protein: 7.7 g/dL (ref 6.5–8.1)

## 2020-05-27 MED ORDER — POTASSIUM CHLORIDE ER 20 MEQ PO TBCR: 20.0000 meq | EXTENDED_RELEASE_TABLET | Freq: Two times a day (BID) | ORAL | 1 refills | Status: DC

## 2020-05-27 MED ORDER — HEPARIN SOD (PORK) LOCK FLUSH 100 UNIT/ML IV SOLN
INTRAVENOUS | Status: AC
  Filled 2020-05-27: qty 5

## 2020-05-27 MED ORDER — HEPARIN SOD (PORK) LOCK FLUSH 100 UNIT/ML IV SOLN
500.0000 [IU] | Freq: Once | INTRAVENOUS | Status: AC
  Administered 2020-05-27: 500 [IU]
  Filled 2020-05-27: qty 5

## 2020-05-27 MED ORDER — METOPROLOL SUCCINATE ER 50 MG PO TB24: 50.0000 mg | ORAL_TABLET | Freq: Every day | ORAL | 0 refills | Status: DC

## 2020-05-27 MED ORDER — AMLODIPINE BESYLATE 10 MG PO TABS: 10.0000 mg | ORAL_TABLET | Freq: Every day | ORAL | 0 refills | Status: DC

## 2020-05-27 NOTE — Assessment & Plan Note (Addendum)
#    Pancreas adenocarcinoma-Stage IB-/boderline resectable ;Given the abutment of the SMV;  neoadjuvant chemotherapy- on FOLFIRINX. S/p 5 cycles of chemo-CT  12/17-ill-defined pancreatic lesion similar size; possible mild abutment of the portal vein/proximal SMV.  Significant improvement of the cystic lesion adjacent to the neck of pancreas [currently 9 mm previously 2.5 cm]; slight increase in size of the peripancreatic lymph node [<cm in size].  Tumor marker normalized.    # S/p evaluation with Dr.Byerly, on Feb 25th-awaiting repeat imaging; plan for [facility].  Reviewed the note from surgery.  # HOLD FOLFIRINOX cycle #11 & 12-given the borderline blood counts/and help improve patient's performance status prior to surgery.  We will plan to finish 2 more cycles of adjuvant therapy post surgery.   # Mucositis- sec chemotherapy-grade 1 through 2.  Continue salt and baking soda rinses.   # Hypertension-poorly controlled [202/99]; Continue Norvasc/Metoprlol.[ per pt At home- 140s]; bring log. Refilled BP medications.   # Hypokalemia-today potassium is 3.4. Continue Kdur 20 BID; refilled  # Myalgias/Jaw discomfort/PN- G-1-2;--STABLE;? oxaliplatin-Irinotecan AEs; plan pre-and post atropine- STABLE. .   # mediport malfunction: flushing; not drawing; NOV- dye study shows a fibrin sheath.     # DISPOSITION: de-accessed # HOLD FOLFIRONOX/KCL today; CANCEL- pump off d-3/udenyca  # follow up 4 weeks-MD; labs- cbc.cmp;ca-19-9; possible KCL over 1 hour; NO chemo. Dr.B

## 2020-05-27 NOTE — Progress Notes (Signed)
Patient ran out of bp medications on Friday evening. Did not call pcp until Monday morning. pcp not able to see patient until Friday. bp today 188/83. Dr. Rogue Bussing made aware.

## 2020-05-27 NOTE — Progress Notes (Signed)
[place] NOTE  Patient Care Team: [place] as PCP - General [name], [place], FNP (Family Medicine) Rico Junker, RN as Registered Nurse Theodore Demark, RN as Registered Nurse Clent Jacks, RN as Oncology Nurse Navigator Stark Klein, MD as Consulting Physician (General Surgery)  CHIEF COMPLAINTS/PURPOSE OF CONSULTATION: pancreas adenocarcinoma   Oncology History Overview Note  #Pancreas adenocarcinoma-Stage IB- uT2uNxuMx-[EUS- Dr.Spaete; [facility]/GI; mass 22x72mm; invading/abutting superior mesenteric vein; 2 enlarged lymph nodes peripancreatic/porta hepatis largest 10 x 5.8 mm-nonpathologic based on EUS criteria/no biopsy]-borderline resectable.  PET scan no evidence of distant metastatic disease.  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 Stevan Born September]  # 01/02/2020-FOLFIRINOX; Neulasta.  # Borderline DM; HTN   # SURVIVORSHIP:   # GENETICS:   DIAGNOSIS: Pancreatic cancer  STAGE:  IB/borderline resectable;  GOALS: cure  CURRENT/MOST RECENT THERAPY : Neoadjuvant-FOLFIRINOX    Cancer of head of pancreas ([place])  12/04/2019 Initial Diagnosis   Cancer of head of pancreas ([place])   12/19/2019 - 12/19/2019 Chemotherapy   The patient had PACLitaxel-protein bound (ABRAXANE) chemo infusion 250 mg, 125 mg/m2 = 250 mg, Intravenous,  Once, 1 of 4 cycles Administration: 250 mg (12/19/2019) gemcitabine (GEMZAR) 2,000 mg in sodium chloride 0.9 % 250 mL chemo infusion, 1,976 mg, Intravenous,  Once, 1 of 4 cycles Administration: 2,000 mg (12/19/2019)  for chemotherapy treatment.    01/02/2020 -  Chemotherapy    Patient is on Treatment Plan: PANCREAS MODIFIED FOLFIRINOX Q14D X 4 CYCLES       HISTORY OF PRESENTING ILLNESS:  Robin Daniels 65 y.o.  female borderline resectable pancreatic adeno ca currently  on neoadjuvant chemotherapy with FOLFIRINOX is here for a follow up.  Patient is currently s/p 10 cycles of FOLFIRINOX chemotherapy.  In the interim patient has been evaluated by surgery for consideration of Whipple.  Patient denies any nausea vomiting abdominal pain.  Mild tingling and numbness.  States to have run out of her blood pressure medications.   Review of Systems  Constitutional: Positive for weight loss. Negative for chills, diaphoresis, fever and malaise/fatigue.  HENT: Negative for nosebleeds and sore throat.   Eyes: Negative for double vision.  Respiratory: Negative for cough, hemoptysis, sputum production, shortness of breath and wheezing.   Cardiovascular: Negative for chest pain, palpitations, orthopnea and leg swelling.  Gastrointestinal: Negative for abdominal pain, blood in stool, constipation, diarrhea, heartburn, melena, nausea and vomiting.  Genitourinary: Negative for dysuria, frequency and urgency.  Musculoskeletal: Negative for back pain and joint pain.  Skin: Negative.  Negative for itching and rash.  Neurological: Positive for tingling. Negative for dizziness, focal weakness, weakness and headaches.  Endo/Heme/Allergies: Does not bruise/bleed easily.  Psychiatric/Behavioral: Negative for depression. The patient is not nervous/anxious and does not have insomnia.      MEDICAL HISTORY:  Past Medical History:  Diagnosis Date  . Anemia    history of  . Anxiety 09/11/2014  . Arthritis   . Cancer of head of pancreas ([place]) 12/04/2019  . Diabetes mellitus without complication ([place])   . Fluttering heart   . Hyperlipidemia   . Hypertension     SURGICAL HISTORY: Past Surgical History:  Procedure Laterality Date  . ENDOSCOPIC RETROGRADE CHOLANGIOPANCREATOGRAPHY (ERCP) WITH PROPOFOL N/A 11/16/2019   Procedure: ENDOSCOPIC RETROGRADE CHOLANGIOPANCREATOGRAPHY (ERCP) WITH PROPOFOL;  Surgeon: Lucilla Lame, MD;  Location: [facility] ENDOSCOPY;  Service: Endoscopy;  Laterality:  N/A;  . ERCP N/A 03/27/2020   Procedure: ENDOSCOPIC RETROGRADE CHOLANGIOPANCREATOGRAPHY (ERCP);  Surgeon: Lucilla Lame, MD;  Location: [facility] ENDOSCOPY;  Service: Endoscopy;  Laterality: N/A;  . EUS N/A 11/29/2019   Procedure: FULL UPPER ENDOSCOPIC ULTRASOUND (EUS) RADIAL;  Surgeon: Reita Cliche, MD;  Location: [facility] ENDOSCOPY;  Service: Gastroenterology;  Laterality: N/A;  . IR IMAGING GUIDED PORT INSERTION  12/14/2019  . right knee replacement Right [id number]  . SHOULDER ARTHROSCOPY W/ ROTATOR CUFF REPAIR Right     SOCIAL HISTORY: Social History   Socioeconomic History  . Marital status: Divorced    Spouse name: Not on file  . Number of children: Not on file  . Years of education: Not on file  . Highest education level: Not on file  Occupational History  . Not on file  Tobacco Use  . Smoking status: Never Smoker  . Smokeless tobacco: Never Used  Vaping Use  . Vaping Use: Never used  Substance and Sexual Activity  . Alcohol use: No    Alcohol/week: 0.0 standard drinks  . Drug use: No  . Sexual activity: Never  Other Topics Concern  . 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    Social Determinants of Health   Financial Resource Strain: Not on file  Food Insecurity: Not on file  Transportation Needs: Not on file  Physical Activity: Not on file  Stress: Not on file  Social Connections: Not on file  Intimate Partner Violence: Not on file    FAMILY HISTORY: Family History  Problem Relation Age of Onset  . Diabetes Mother   . Hyperlipidemia Mother   . Hypertension Mother   . Vision loss Mother   . Arthritis Father   . Hyperlipidemia Father   . Hypertension Father   . Breast cancer Maternal Aunt     ALLERGIES:  has No Known Allergies.  MEDICATIONS:  Current Outpatient Medications  Medication Sig Dispense Refill  . lidocaine-prilocaine (EMLA) cream Apply topically once.    .  vitamin B-12 (CYANOCOBALAMIN) 1000 MCG tablet Take 1 tablet (1,000 mcg total) by mouth daily.    Marland Kitchen amLODipine (NORVASC) 10 MG tablet Take 1 tablet (10 mg total) by mouth daily. 30 tablet 0  . diphenoxylate-atropine (LOMOTIL) 2.5-0.025 MG tablet Take 1 tablet by mouth 4 (four) times daily as needed for diarrhea or loose stools. Take it along with immodium (Patient not taking: No sig reported) 45 tablet 0  . metoprolol succinate (TOPROL-XL) 50 MG 24 hr tablet Take 1 tablet (50 mg total) by mouth daily. Take with or immediately following a meal. 30 tablet 0  . Potassium Chloride ER 20 MEQ TBCR Take 20 mEq by mouth in the morning and at bedtime. 60 tablet 1   No current facility-administered medications for this visit.      Marland Kitchen  PHYSICAL EXAMINATION: ECOG PERFORMANCE STATUS: 1 - Symptomatic but completely ambulatory  Vitals:   05/27/20 0919  BP: (!) 188/83  Pulse: (!) 103  Resp: 20  Temp: 99.3 F (37.4 C)   Filed Weights   05/27/20 0919  Weight: 186 lb 14.4 oz (84.8 kg)    Physical Exam HENT:     Head: Normocephalic and atraumatic.     Mouth/Throat:     Pharynx: No oropharyngeal exudate.  Eyes:     Pupils: Pupils are equal, round, and reactive to light.  Cardiovascular:     Rate and  Rhythm: Normal rate and regular rhythm.  Pulmonary:     Effort: Pulmonary effort is normal. No respiratory distress.     Breath sounds: Normal breath sounds. No wheezing.  Abdominal:     General: Bowel sounds are normal. There is no distension.     Palpations: Abdomen is soft. There is no mass.     Tenderness: There is no abdominal tenderness. There is no guarding or rebound.  Musculoskeletal:        General: No tenderness. Normal range of motion.     Cervical back: Normal range of motion and neck supple.  Skin:    General: Skin is warm.  Neurological:     Mental Status: She is alert and oriented to person, place, and time.  Psychiatric:        Mood and Affect: Affect normal.       LABORATORY DATA:  I have reviewed the data as listed Lab Results  Component Value Date   WBC 3.7 (L) 05/27/2020   HGB 8.4 (L) 05/27/2020   HCT 27.1 (L) 05/27/2020   MCV 83.4 05/27/2020   PLT 141 (L) 05/27/2020   Recent Labs    11/21/19 1449 12/04/19 1327 12/19/19 0833 01/02/20 0831 04/29/20 0806 05/13/20 0852 05/27/20 0847  NA 133* 136 139   < > 137 138 138  K 4.2 3.6 3.2*   < > 3.1* 3.3* 3.4*  CL 91* 102 102   < > 102 101 99  CO2 30 26 26    < > 24 25 26   GLUCOSE 169* 126* 168*   < > 138* 145* 126*  BUN 15 7* 8   < > 7* 5* 6*  CREATININE 1.02* 0.90 0.89   < > 0.75 0.68 0.66  CALCIUM 9.0 8.7* 8.9   < > 9.0 8.9 8.9  GFRNONAA 57* >60 >60   < > >60 >60 >60  GFRAA >60 >60 >60  --   --   --   --   PROT 8.3* 7.3 7.6   < > 7.5 7.6 7.7  ALBUMIN 3.4* 3.5 3.5   < > 3.4* 3.2* 3.4*  AST 45* 19 35   < > 32 30 30  ALT 137* 22 50*   < > 18 20 17   ALKPHOS 405* 156* 294*   < > 143* 152* 167*  BILITOT 1.8* 0.9 0.7   < > 0.4 0.4 0.5   < > = values in this interval not displayed.    RADIOGRAPHIC STUDIES: I have personally reviewed the radiological images as listed and agreed with the findings in the report. No results found.  ASSESSMENT & PLAN:   Cancer of head of pancreas ([place]) #  Pancreas adenocarcinoma-Stage IB-/boderline resectable ;Given the abutment of the SMV;  neoadjuvant chemotherapy- on FOLFIRINX. S/p 5 cycles of chemo-CT  12/17-ill-defined pancreatic lesion similar size; possible mild abutment of the portal vein/proximal SMV.  Significant improvement of the cystic lesion adjacent to the neck of pancreas [currently 9 mm previously 2.5 cm]; slight increase in size of the peripancreatic lymph node [<cm in size].  Tumor marker normalized.    # S/p evaluation with Dr.Byerly, on Feb 25th-awaiting repeat imaging; plan for Whipple.   # HOLD FOLFIRINOX cycle #11 & 12-given the borderline blood counts/and help improve patient's performance status prior to surgery.  We will plan  to finish 2 more cycles of adjuvant therapy post surgery.   # Mucositis- sec chemotherapy-grade 1 through 2.  Continue salt and baking soda rinses.   #  Hypertension-poorly controlled [202/99]; Continue Norvasc/Metoprlol.[ per pt At home- 140s]; bring log. Refilled BP medications.   # Hypokalemia-today potassium is 3.4. Continue Kdur 20 BID; refilled  # Myalgias/Jaw discomfort/PN- G-1-2;--STABLE;? oxaliplatin-Irinotecan AEs; plan pre-and post atropine- STABLE. .   # mediport malfunction: flushing; not drawing; NOV- dye study shows a fibrin sheath.     # DISPOSITION: de-accessed # HOLD FOLFIRONOX/KCL today; CANCEL- pump off d-3/udenyca  # follow up 4 weeks-MD; labs- cbc.cmp;ca-19-9; possible KCL over 1 hour; NO chemo. Dr.B   All questions were answered. The patient knows to call the clinic with any problems, questions or concerns.    Cammie Sickle, MD 05/27/2020 10:38 AM

## 2020-05-28 LAB — CANCER ANTIGEN 19-9: CA 19-9: 21 U/mL (ref 0–35)

## 2020-05-29 ENCOUNTER — Inpatient Hospital Stay: Payer: Medicare Other

## 2020-06-04 ENCOUNTER — Other Ambulatory Visit: Payer: Self-pay | Admitting: Family Medicine

## 2020-06-04 DIAGNOSIS — Z13820 Encounter for screening for osteoporosis: Secondary | ICD-10-CM

## 2020-06-13 ENCOUNTER — Other Ambulatory Visit: Payer: Self-pay | Admitting: General Surgery

## 2020-06-13 DIAGNOSIS — C250 Malignant neoplasm of head of pancreas: Secondary | ICD-10-CM

## 2020-06-24 ENCOUNTER — Inpatient Hospital Stay (HOSPITAL_BASED_OUTPATIENT_CLINIC_OR_DEPARTMENT_OTHER): Payer: Medicare Other | Admitting: Internal Medicine

## 2020-06-24 ENCOUNTER — Inpatient Hospital Stay: Payer: Medicare Other

## 2020-06-24 ENCOUNTER — Encounter: Payer: Self-pay | Admitting: Internal Medicine

## 2020-06-24 DIAGNOSIS — C250 Malignant neoplasm of head of pancreas: Secondary | ICD-10-CM | POA: Diagnosis not present

## 2020-06-24 DIAGNOSIS — E876 Hypokalemia: Secondary | ICD-10-CM

## 2020-06-24 LAB — COMPREHENSIVE METABOLIC PANEL
ALT: 15 U/L (ref 0–44)
AST: 25 U/L (ref 15–41)
Albumin: 3.6 g/dL (ref 3.5–5.0)
Alkaline Phosphatase: 153 U/L — ABNORMAL HIGH (ref 38–126)
Anion gap: 11 (ref 5–15)
BUN: 9 mg/dL (ref 8–23)
CO2: 23 mmol/L (ref 22–32)
Calcium: 9.2 mg/dL (ref 8.9–10.3)
Chloride: 101 mmol/L (ref 98–111)
Creatinine, Ser: 0.77 mg/dL (ref 0.44–1.00)
GFR, Estimated: >60 mL/min (ref >60)
Glucose, Bld: 150 mg/dL — ABNORMAL HIGH (ref 70–99)
Potassium: 3.6 mmol/L (ref 3.5–5.1)
Sodium: 135 mmol/L (ref 135–145)
Total Bilirubin: 0.4 mg/dL (ref 0.3–1.2)
Total Protein: 8.3 g/dL — ABNORMAL HIGH (ref 6.5–8.1)

## 2020-06-24 LAB — CBC WITH DIFFERENTIAL/PLATELET
Abs Immature Granulocytes: 0.01 10*3/uL (ref 0.00–0.07)
Basophils Absolute: 0 10*3/uL (ref 0.0–0.1)
Basophils Relative: 0 %
Eosinophils Absolute: 0.1 10*3/uL (ref 0.0–0.5)
Eosinophils Relative: 1 %
HCT: 28.3 % — ABNORMAL LOW (ref 36.0–46.0)
Hemoglobin: 8.8 g/dL — ABNORMAL LOW (ref 12.0–15.0)
Immature Granulocytes: 0 %
Lymphocytes Relative: 45 %
Lymphs Abs: 2.1 10*3/uL (ref 0.7–4.0)
MCH: 26 pg (ref 26.0–34.0)
MCHC: 31.1 g/dL (ref 30.0–36.0)
MCV: 83.7 fL (ref 80.0–100.0)
Monocytes Absolute: 0.5 10*3/uL (ref 0.1–1.0)
Monocytes Relative: 10 %
Neutro Abs: 2.1 10*3/uL (ref 1.7–7.7)
Neutrophils Relative %: 44 %
Platelets: 226 10*3/uL (ref 150–400)
RBC: 3.38 MIL/uL — ABNORMAL LOW (ref 3.87–5.11)
RDW: 18.5 % — ABNORMAL HIGH (ref 11.5–15.5)
WBC: 4.7 10*3/uL (ref 4.0–10.5)
nRBC: 0 % (ref 0.0–0.2)

## 2020-06-24 MED ORDER — HEPARIN SOD (PORK) LOCK FLUSH 100 UNIT/ML IV SOLN
500.0000 [IU] | Freq: Once | INTRAVENOUS | Status: AC | PRN
  Administered 2020-06-24: 500 [IU]
  Filled 2020-06-24: qty 5

## 2020-06-24 MED ORDER — SODIUM CHLORIDE 0.9% FLUSH
10.0000 mL | Freq: Once | INTRAVENOUS | Status: AC | PRN
  Administered 2020-06-24: 10 mL
  Filled 2020-06-24: qty 10

## 2020-06-24 NOTE — Assessment & Plan Note (Addendum)
#    Pancreas adenocarcinoma-Stage IB-/boderline resectable ;Given the abutment of the SMV;  neoadjuvant chemotherapy- on FOLFIRINX.  Stable disease noted after 5 cycles of chemotherapy.  #Currently s/p 10 cycles of FOLFIRINOX chemotherapy [#10- on 05/13/2020]-awaiting repeat imaging on April 5.  Patient is awaiting Whipple's/definitive surgery on April 19th.   # CONTINUE TO HOLD FOLFIRINOX cycle #11 & 12- help improve patient's performance status prior to surgery.  We will plan to finish 2 more cycles of adjuvant therapy post surgery.   # Hypertension-poorly controlled 185/92]; Continue Norvasc/Metoprlol.[ per pt At home- does not check];AGAIN RE-ITERATED to bring log.   # Hypokalemia-today potassium -3.6 stable.  #Peripheral neuropathy grade 1-second chemotherapy.  Monitor for now.  # mediport malfunction: flushing; not drawing; NOV- dye study shows a fibrin sheath.     # DISPOSITION: de-accessed # MAY 9th week- MD; labs- cbc/cmp/ca-19-9; possible IVFs over 1 hour- Dr.B

## 2020-06-24 NOTE — Progress Notes (Signed)
Pt in for follow up, states has numbness in finger tips and toes.  Denies any difficulties or concerns today.

## 2020-06-24 NOTE — Progress Notes (Signed)
[place] NOTE  Patient Care Team: [place] as PCP - General [name], [place], FNP (Family Medicine) Rico Junker, RN as Registered Nurse Theodore Demark, RN as Registered Nurse Clent Jacks, RN as Oncology Nurse Navigator Stark Klein, MD as Consulting Physician (General Surgery)  CHIEF COMPLAINTS/PURPOSE OF CONSULTATION: pancreas adenocarcinoma   Oncology History Overview Note  #Pancreas adenocarcinoma-Stage IB- uT2uNxuMx-[EUS- Dr.Spaete; [facility]/GI; mass 22x3mm; invading/abutting superior mesenteric vein; 2 enlarged lymph nodes peripancreatic/porta hepatis largest 10 x 5.8 mm-nonpathologic based on EUS criteria/no biopsy]-borderline resectable.  PET scan no evidence of distant metastatic disease.  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 Stevan Born September]  # 01/02/2020-FOLFIRINOX; Neulasta.  # Borderline DM; HTN   # SURVIVORSHIP:   # GENETICS:   DIAGNOSIS: Pancreatic cancer  STAGE:  IB/borderline resectable;  GOALS: cure  CURRENT/MOST RECENT THERAPY : Neoadjuvant-FOLFIRINOX    Cancer of head of pancreas ([place])  12/04/2019 Initial Diagnosis   Cancer of head of pancreas ([place])   12/19/2019 - 12/19/2019 Chemotherapy   The patient had PACLitaxel-protein bound (ABRAXANE) chemo infusion 250 mg, 125 mg/m2 = 250 mg, Intravenous,  Once, 1 of 4 cycles Administration: 250 mg (12/19/2019) gemcitabine (GEMZAR) 2,000 mg in sodium chloride 0.9 % 250 mL chemo infusion, 1,976 mg, Intravenous,  Once, 1 of 4 cycles Administration: 2,000 mg (12/19/2019)  for chemotherapy treatment.    01/02/2020 -  Chemotherapy    Patient is on Treatment Plan: PANCREAS MODIFIED FOLFIRINOX Q14D X 4 CYCLES       HISTORY OF PRESENTING ILLNESS:  Robin Daniels 65 y.o.  female borderline resectable pancreatic adeno ca currently  on neoadjuvant chemotherapy with FOLFIRINOX is here for a follow up.  Patient is currently s/p 10 cycles of FOLFIRINOX chemotherapy.  In the interim patient has been evaluated by surgery for consideration of Whipple.  Surgery scheduled on April 19.  Patient denies any nausea vomiting abdominal pain.  Complains of mild tingling numbness in extremities.  Patient states to be compliant with her blood pressure medications; however she does not check her blood pressure at home.  Review of Systems  Constitutional: Positive for weight loss. Negative for chills, diaphoresis, fever and malaise/fatigue.  HENT: Negative for nosebleeds and sore throat.   Eyes: Negative for double vision.  Respiratory: Negative for cough, hemoptysis, sputum production, shortness of breath and wheezing.   Cardiovascular: Negative for chest pain, palpitations, orthopnea and leg swelling.  Gastrointestinal: Negative for abdominal pain, blood in stool, constipation, diarrhea, heartburn, melena, nausea and vomiting.  Genitourinary: Negative for dysuria, frequency and urgency.  Musculoskeletal: Negative for back pain and joint pain.  Skin: Negative.  Negative for itching and rash.  Neurological: Positive for tingling. Negative for dizziness, focal weakness, weakness and headaches.  Endo/Heme/Allergies: Does not bruise/bleed easily.  Psychiatric/Behavioral: Negative for depression. The patient is not nervous/anxious and does not have insomnia.      MEDICAL HISTORY:  Past Medical History:  Diagnosis Date  . Anemia    history of  . Anxiety 09/11/2014  . Arthritis   . Cancer of head of pancreas ([place]) 12/04/2019  . Diabetes mellitus without complication ([place])   . Fluttering heart   . Hyperlipidemia   . Hypertension     SURGICAL HISTORY: Past Surgical History:  Procedure Laterality Date  . ENDOSCOPIC RETROGRADE CHOLANGIOPANCREATOGRAPHY (ERCP) WITH PROPOFOL N/A 11/16/2019   Procedure: ENDOSCOPIC RETROGRADE  CHOLANGIOPANCREATOGRAPHY (ERCP) WITH PROPOFOL;  Surgeon: Lucilla Lame, MD;  Location: [facility] ENDOSCOPY;  Service: Endoscopy;  Laterality: N/A;  . ERCP N/A 03/27/2020   Procedure: ENDOSCOPIC RETROGRADE CHOLANGIOPANCREATOGRAPHY (ERCP);  Surgeon: Lucilla Lame, MD;  [facility] ENDOSCOPY;  Service: Endoscopy;  Laterality: N/A;  . EUS N/A 11/29/2019   Procedure: FULL UPPER ENDOSCOPIC ULTRASOUND (EUS) RADIAL;  Surgeon: Reita Cliche, MD;  Location: [facility] ENDOSCOPY;  Service: Gastroenterology;  Laterality: N/A;  . IR IMAGING GUIDED PORT INSERTION  12/14/2019  . right knee replacement Right [id number]  . SHOULDER ARTHROSCOPY W/ ROTATOR CUFF REPAIR Right     SOCIAL HISTORY: Social History   Socioeconomic History  . Marital status: Divorced    Spouse name: Not on file  . Number of children: Not on file  . Years of education: Not on file  . Highest education level: Not on file  Occupational History  . Not on file  Tobacco Use  . Smoking status: Never Smoker  . Smokeless tobacco: Never Used  Vaping Use  . Vaping Use: Never used  Substance and Sexual Activity  . Alcohol use: No    Alcohol/week: 0.0 standard drinks  . Drug use: No  . Sexual activity: Never  Other Topics Concern  . 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    Social Determinants of Health   Financial Resource Strain: Not on file  Food Insecurity: Not on file  Transportation Needs: Not on file  Physical Activity: Not on file  Stress: Not on file  Social Connections: Not on file  Intimate Partner Violence: Not on file    FAMILY HISTORY: Family History  Problem Relation Age of Onset  . Diabetes Mother   . Hyperlipidemia Mother   . Hypertension Mother   . Vision loss Mother   . Arthritis Father   . Hyperlipidemia Father   . Hypertension Father   . Breast cancer Maternal Aunt     ALLERGIES:  has No Known Allergies.  MEDICATIONS:   Current Outpatient Medications  Medication Sig Dispense Refill  . amLODipine (NORVASC) 10 MG tablet Take 1 tablet (10 mg total) by mouth daily. 30 tablet 0  . lidocaine-prilocaine (EMLA) cream Apply topically once.    . metoprolol succinate (TOPROL-XL) 50 MG 24 hr tablet Take 1 tablet (50 mg total) by mouth daily. Take with or immediately following a meal. 30 tablet 0  . Potassium Chloride ER 20 MEQ TBCR Take 20 mEq by mouth in the morning and at bedtime. 60 tablet 1  . vitamin B-12 (CYANOCOBALAMIN) 1000 MCG tablet Take 1 tablet (1,000 mcg total) by mouth daily.    . diphenoxylate-atropine (LOMOTIL) 2.5-0.025 MG tablet Take 1 tablet by mouth 4 (four) times daily as needed for diarrhea or loose stools. Take it along with immodium (Patient not taking: No sig reported) 45 tablet 0   No current facility-administered medications for this visit.      Marland Kitchen  PHYSICAL EXAMINATION: ECOG PERFORMANCE STATUS: 1 - Symptomatic but completely ambulatory  Vitals:   06/24/20 1007  BP: (!) 185/92  Pulse: 100  Resp: 18  Temp: (!) 97.4 F (36.3 C)  SpO2: 99%   Filed Weights   06/24/20 1007  Weight: 186 lb (84.4 kg)    Physical Exam HENT:     Head: Normocephalic and atraumatic.     Mouth/Throat:     Pharynx: No oropharyngeal exudate.  Eyes:  Pupils: Pupils are equal, round, and reactive to light.  Cardiovascular:     Rate and Rhythm: Normal rate and regular rhythm.  Pulmonary:     Effort: Pulmonary effort is normal. No respiratory distress.     Breath sounds: Normal breath sounds. No wheezing.  Abdominal:     General: Bowel sounds are normal. There is no distension.     Palpations: Abdomen is soft. There is no mass.     Tenderness: There is no abdominal tenderness. There is no guarding or rebound.  Musculoskeletal:        General: No tenderness. Normal range of motion.     Cervical back: Normal range of motion and neck supple.  Skin:    General: Skin is warm.  Neurological:      Mental Status: She is alert and oriented to person, place, and time.  Psychiatric:        Mood and Affect: Affect normal.      LABORATORY DATA:  I have reviewed the data as listed Lab Results  Component Value Date   WBC 4.7 06/24/2020   HGB 8.8 (L) 06/24/2020   HCT 28.3 (L) 06/24/2020   MCV 83.7 06/24/2020   PLT 226 06/24/2020   Recent Labs    11/21/19 1449 12/04/19 1327 12/19/19 0833 01/02/20 0831 05/13/20 0852 05/27/20 0847 06/24/20 0955  NA 133* 136 139   < > 138 138 135  K 4.2 3.6 3.2*   < > 3.3* 3.4* 3.6  CL 91* 102 102   < > 101 99 101  CO2 30 26 26    < > 25 26 23   GLUCOSE 169* 126* 168*   < > 145* 126* 150*  BUN 15 7* 8   < > 5* 6* 9  CREATININE 1.02* 0.90 0.89   < > 0.68 0.66 0.77  CALCIUM 9.0 8.7* 8.9   < > 8.9 8.9 9.2  GFRNONAA 57* >60 >60   < > >60 >60 >60  GFRAA >60 >60 >60  --   --   --   --   PROT 8.3* 7.3 7.6   < > 7.6 7.7 8.3*  ALBUMIN 3.4* 3.5 3.5   < > 3.2* 3.4* 3.6  AST 45* 19 35   < > 30 30 25   ALT 137* 22 50*   < > 20 17 15   ALKPHOS 405* 156* 294*   < > 152* 167* 153*  BILITOT 1.8* 0.9 0.7   < > 0.4 0.5 0.4   < > = values in this interval not displayed.    RADIOGRAPHIC STUDIES: I have personally reviewed the radiological images as listed and agreed with the findings in the report. No results found.  ASSESSMENT & PLAN:   Cancer of head of pancreas ([place]) #  Pancreas adenocarcinoma-Stage IB-/boderline resectable ;Given the abutment of the SMV;  neoadjuvant chemotherapy- on FOLFIRINX.  Stable disease noted after 5 cycles of chemotherapy.  #Currently s/p 10 cycles of FOLFIRINOX chemotherapy [#10- on 05/13/2020]-awaiting repeat imaging on April 5.  Patient is awaiting Whipple's/definitive surgery on April 19th.   # CONTINUE TO HOLD FOLFIRINOX cycle #11 & 12- help improve patient's performance status prior to surgery.  We will plan to finish 2 more cycles of adjuvant therapy post surgery.   # Hypertension-poorly controlled 185/92]; Continue  Norvasc/Metoprlol.[ per pt At home- does not check];AGAIN RE-ITERATED to bring log.   # Hypokalemia-today potassium -3.6 stable.  #Peripheral neuropathy grade 1-second chemotherapy.  Monitor for now.  # mediport  malfunction: flushing; not drawing; NOV- dye study shows a fibrin sheath.     # DISPOSITION: de-accessed # MAY 9th week- MD; labs- cbc/cmp/ca-19-9; possible IVFs over 1 hour- Dr.B   All questions were answered. The patient knows to call the clinic with any problems, questions or concerns.    Cammie Sickle, MD 06/24/2020 12:35 PM

## 2020-06-25 LAB — CANCER ANTIGEN 19-9: CA 19-9: 15 U/mL (ref 0–35)

## 2020-07-01 ENCOUNTER — Ambulatory Visit
Admission: RE | Admit: 2020-07-01 | Discharge: 2020-07-01 | Disposition: A | Discharge status: Home / Self Care | Payer: Medicare Other | Source: Ambulatory Visit | Attending: General Surgery | Admitting: General Surgery

## 2020-07-01 DIAGNOSIS — C250 Malignant neoplasm of head of pancreas: Secondary | ICD-10-CM

## 2020-07-01 MED ORDER — IOPAMIDOL (ISOVUE-300) INJECTION 61%
100.0000 mL | Freq: Once | INTRAVENOUS | Status: AC | PRN
  Administered 2020-07-01: 100 mL via INTRAVENOUS

## 2020-07-10 NOTE — Progress Notes (Addendum)
Surgical Instructions    Your procedure is scheduled on 07/15/20.  Report to [facility] Main Entrance "A" at 5:30 A.M., then check in with the Admitting office.  Call this number if you have problems the morning of surgery:  [phone]   If you have any questions prior to your surgery date call [phone]: Open Monday-Friday 8am-4pm    Remember:  Do not eat  after midnight the night before your surgery  You may drink clear liquids until 4:30the morning of your surgery.   Clear liquids allowed are: Water, Non-Citrus Juices (without pulp), Carbonated Beverages, Clear Tea, Black Coffee Only, and Gatorade  Please complete your bottle of water that was provided to you by 4:30 the morning of surgery.  Please, if able, drink it in one setting. DO NOT SIP.    Take these medicines the morning of surgery with A SIP OF WATER: amLODipine (NORVASC) metoprolol succinate (TOPROL-XL) acetaminophen (TYLENOL) - if needed  As of today, STOP taking any Aspirin (unless otherwise instructed by your surgeon) Aleve, Naproxen, Ibuprofen, Motrin, Advil, Goody's, BC's, all herbal medications, fish oil, and all vitamins.                     Do not wear jewelry, make up, or nail polish            Do not wear lotions, powders, perfumes or deodorant.            Do not shave 48 hours prior to surgery.             Do not bring valuables to the hospital.            [facility] is not responsible for any belongings or valuables.  Do NOT Smoke (Tobacco/Vaping) or drink Alcohol 24 hours prior to your procedure If you use a CPAP at night, you may bring all equipment for your overnight stay.   Contacts, glasses, dentures or bridgework may not be worn into surgery, please bring cases for these belongings   For patients admitted to the hospital, discharge time will be determined by your treatment team.   Patients discharged the day of surgery will not be allowed to drive home, and someone needs to stay with them  for 24 hours.    Special instructions:   [place]- Preparing For Surgery  Before surgery, you can play an important role. Because skin is not sterile, your skin needs to be as free of germs as possible. You can reduce the number of germs on your skin by washing with CHG (chlorahexidine gluconate) Soap before surgery.  CHG is an antiseptic cleaner which kills germs and bonds with the skin to continue killing germs even after washing.    Oral Hygiene is also important to reduce your risk of infection.  Remember - BRUSH YOUR TEETH THE MORNING OF SURGERY WITH YOUR REGULAR TOOTHPASTE  Please do not use if you have an allergy to CHG or antibacterial soaps. If your skin becomes reddened/irritated stop using the CHG.  Do not shave (including legs and underarms) for at least 48 hours prior to first CHG shower. It is OK to shave your face.  Please follow these instructions carefully.   1. Shower the NIGHT BEFORE SURGERY and the MORNING OF SURGERY  2. If you chose to wash your hair, wash your hair first as usual with your normal shampoo.  3. After you shampoo, rinse your hair and body thoroughly to remove the shampoo.  4.  Wash Face and genitals (private parts) with your normal soap.   5.  Shower the NIGHT BEFORE SURGERY and the MORNING OF SURGERY with CHG Soap.   6. Use CHG Soap as you would any other liquid soap. You can apply CHG directly to the skin and wash gently with a scrungie or a clean washcloth.   7. Apply the CHG Soap to your body ONLY FROM THE NECK DOWN.  Do not use on open wounds or open sores. Avoid contact with your eyes, ears, mouth and genitals (private parts). Wash Face and genitals (private parts)  with your normal soap.   8. Wash thoroughly, paying special attention to the area where your surgery will be performed.  9. Thoroughly rinse your body with warm water from the neck down.  10. DO NOT shower/wash with your normal soap after using and rinsing off the CHG  Soap.  11. Pat yourself dry with a CLEAN TOWEL.  12. Wear CLEAN PAJAMAS to bed the night before surgery  13. Place CLEAN SHEETS on your bed the night before your surgery  14. DO NOT SLEEP WITH PETS.   Day of Surgery: Take a shower with CHG soap. Wear Clean/Comfortable clothing the morning of surgery Do not apply any deodorants/lotions.   Remember to brush your teeth WITH YOUR REGULAR TOOTHPASTE.   Please read over the following fact sheets that you were given.

## 2020-07-11 ENCOUNTER — Other Ambulatory Visit: Payer: Self-pay

## 2020-07-11 ENCOUNTER — Other Ambulatory Visit (HOSPITAL_COMMUNITY): Payer: Medicare Other

## 2020-07-11 ENCOUNTER — Encounter (HOSPITAL_COMMUNITY)
Admission: RE | Admit: 2020-07-11 | Discharge: 2020-07-11 | Disposition: A | Discharge status: Home / Self Care | Payer: Medicare Other | Source: Ambulatory Visit | Attending: General Surgery | Admitting: General Surgery

## 2020-07-11 ENCOUNTER — Encounter (HOSPITAL_COMMUNITY): Payer: Self-pay

## 2020-07-11 DIAGNOSIS — Z20822 Contact with and (suspected) exposure to covid-19: Secondary | ICD-10-CM | POA: Insufficient documentation

## 2020-07-11 DIAGNOSIS — Z01812 Encounter for preprocedural laboratory examination: Secondary | ICD-10-CM | POA: Insufficient documentation

## 2020-07-11 HISTORY — DX: Other specified postprocedural states: Z98.890

## 2020-07-11 HISTORY — DX: Nausea with vomiting, unspecified: R11.2

## 2020-07-11 HISTORY — DX: Other complications of anesthesia, initial encounter: T88.59XA

## 2020-07-11 HISTORY — DX: Prediabetes: R73.03

## 2020-07-11 LAB — COMPREHENSIVE METABOLIC PANEL
ALT: 18 U/L (ref 0–44)
AST: 23 U/L (ref 15–41)
Albumin: 3.6 g/dL (ref 3.5–5.0)
Alkaline Phosphatase: 154 U/L — ABNORMAL HIGH (ref 38–126)
Anion gap: 7 (ref 5–15)
BUN: 9 mg/dL (ref 8–23)
CO2: 24 mmol/L (ref 22–32)
Calcium: 9.2 mg/dL (ref 8.9–10.3)
Chloride: 106 mmol/L (ref 98–111)
Creatinine, Ser: 0.83 mg/dL (ref 0.44–1.00)
GFR, Estimated: >60 mL/min (ref >60)
Glucose, Bld: 131 mg/dL — ABNORMAL HIGH (ref 70–99)
Potassium: 3.2 mmol/L — ABNORMAL LOW (ref 3.5–5.1)
Sodium: 137 mmol/L (ref 135–145)
Total Bilirubin: 0.7 mg/dL (ref 0.3–1.2)
Total Protein: 8.2 g/dL — ABNORMAL HIGH (ref 6.5–8.1)

## 2020-07-11 LAB — URINALYSIS, ROUTINE W REFLEX MICROSCOPIC
Bilirubin Urine: NEGATIVE
Glucose, UA: NEGATIVE mg/dL
Hgb urine dipstick: NEGATIVE
Ketones, ur: NEGATIVE mg/dL
Leukocytes,Ua: NEGATIVE
Nitrite: NEGATIVE
Protein, ur: NEGATIVE mg/dL
Specific Gravity, Urine: 1.016 (ref 1.005–1.030)
pH: 5 (ref 5.0–8.0)

## 2020-07-11 LAB — CBC WITH DIFFERENTIAL/PLATELET
Abs Immature Granulocytes: 0.01 10*3/uL (ref 0.00–0.07)
Basophils Absolute: 0 10*3/uL (ref 0.0–0.1)
Basophils Relative: 0 %
Eosinophils Absolute: 0 10*3/uL (ref 0.0–0.5)
Eosinophils Relative: 1 %
HCT: 30.9 % — ABNORMAL LOW (ref 36.0–46.0)
Hemoglobin: 9.3 g/dL — ABNORMAL LOW (ref 12.0–15.0)
Immature Granulocytes: 0 %
Lymphocytes Relative: 38 %
Lymphs Abs: 1.6 10*3/uL (ref 0.7–4.0)
MCH: 26.1 pg (ref 26.0–34.0)
MCHC: 30.1 g/dL (ref 30.0–36.0)
MCV: 86.6 fL (ref 80.0–100.0)
Monocytes Absolute: 0.3 10*3/uL (ref 0.1–1.0)
Monocytes Relative: 8 %
Neutro Abs: 2.1 10*3/uL (ref 1.7–7.7)
Neutrophils Relative %: 53 %
Platelets: 290 10*3/uL (ref 150–400)
RBC: 3.57 MIL/uL — ABNORMAL LOW (ref 3.87–5.11)
RDW: 16.2 % — ABNORMAL HIGH (ref 11.5–15.5)
WBC: 4.1 10*3/uL (ref 4.0–10.5)
nRBC: 0 % (ref 0.0–0.2)

## 2020-07-11 LAB — GLUCOSE, CAPILLARY: Glucose-Capillary: 129 mg/dL — ABNORMAL HIGH (ref 70–99)

## 2020-07-11 LAB — PREPARE RBC (CROSSMATCH)

## 2020-07-11 LAB — HEMOGLOBIN A1C
Hgb A1c MFr Bld: 5.2 % (ref 4.8–5.6)
Mean Plasma Glucose: 103 mg/dL

## 2020-07-11 LAB — PROTIME-INR
INR: 1.1 (ref 0.8–1.2)
Prothrombin Time: 13.7 seconds (ref 11.4–15.2)

## 2020-07-11 LAB — SARS CORONAVIRUS 2 (TAT 6-24 HRS): SARS Coronavirus 2: NEGATIVE

## 2020-07-11 NOTE — Progress Notes (Signed)
PCP: [place] Cardiologist: Denies  EKG: 09/10/19 CXR: 09/08/19 ECHO: denies Stress Test: denies Cardiac Cath: denies  Fasting Blood Sugar- does not check  Checks Blood Sugar__0_ times a day  OSA/CPAP: No  ASA/Blood Thinners: No  Covid test 07/11/20 at PAT  Anesthesia Review: Yes,Hgb 9.3  Patient denies shortness of breath, fever, cough, and chest pain at PAT appointment.  Patient verbalized understanding of instructions provided today at the PAT appointment.  Patient asked to review instructions at home and day of surgery.

## 2020-07-11 NOTE — Progress Notes (Signed)
PCP: [place] Cardiologist: Denies  EKG: 09/10/19 CXR: 09/08/19 ECHO: denies Stress Test: denies Cardiac Cath: denies  Fasting Blood Sugar- does not check  Checks Blood Sugar__0_ times a day  OSA/CPAP: No  ASA/Blood Thinners: No  Covid test 07/11/20 at PAT  Anesthesia Review: No  Patient denies shortness of breath, fever, cough, and chest pain at PAT appointment.  Patient verbalized understanding of instructions provided today at the PAT appointment.  Patient asked to review instructions at home and day of surgery.

## 2020-07-14 NOTE — H&P (Signed)
Robin Daniels Location: [place] Office Patient #: [id number] DOB: 06-05-1955 Divorced / Language: English / Race: Black or African American Female   History of Present Illness  The patient is a 65 year old female who presents for a follow-up for Pancreatic cancer. Pt is a 65 yo F referred by Dr. Rogue Bussing for new dx of adenocarcinoma of the pancreatic head diagnosed 11/2019. She developed abnormal colored urine and jaundice in August 2021. PCP sent her to hospital for evaluation. She was found to have a pancreatic head mass with dilated ducts. She had ERCP/stenting followed by [facility]. FNA was confirmative of cancer with uT2N1 and abutment of SMV. She had port placed and started chemotherapy. She has not had any significant side effects from chemo. She has had resolution of abnormal urine.   She never had pain or n/v prior to diagnosis. She did not have any significant weight loss. She has had borderline diabetes for a while. She denies diarrhea. She had a father with prostate cancer and a maternal aunt with breast cancer.   She has had 10 cycles of folfirinox. Repeat imaging shows no dramatic change, but no obvious involvement of the SMV/portal vein. No collaterals seen. She had to get her port replaced. She also has had some mucositis. She is understandably nervous. She still hasn't had any significant weight loss. She is having loss of taste from her chemo.   CT panc 03/13/2020 IMPRESSION: 1. Pancreatic lesion with ill-defined margins appears similar accounting for very limited visualization of discrete lesion on the current scan. 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 No signs of arterial involvement. 2. Cystic area adjacent to the neck of the pancreas is nearly completely resolved. Perhaps a very small 9 mm focus at the site of a previously 2.5 x 1.5 cm area. 3. Small lymph nodes about the porta hepatis and pancreas  show little change. Slightly increased size of a peripancreatic lymph node, this remains less than a cm. 4. Persistent biliary duct distension with biliary stent in situ. 5. Gallbladder with mural stratification and mucosal enhancement, less distended than on the prior study. This change likely relates to degree of distension and prior inflammation. Correlate with any symptoms that would suggest ongoing gallbladder pathology/cholecystitis. Reportedly the patient was asymptomatic at the time of scan. 6. Aortic atherosclerosis.  MR wtih MRCP 11/14/2019 IMPRESSION: New diffuse biliary ductal dilatation, with abrupt stricture of the distal common bile duct in the pancreatic head.  3 cm irregular predominantly solid lesion with cystic areas in the pancreatic head, highly suspicious for pancreatic carcinoma. Consider endoscopic ultrasound for further evaluation.  Mild lymphadenopathy in the porta hepatis and portacaval region, suspicious for metastatic disease.  Cholelithiasis and mild diffuse gallbladder wall thickening. Although this may be due to biliary obstruction, acute cholecystitis cannot definitely be excluded   PET 11/28/2019 IMPRESSION: 1. Mild radiotracer uptake localizes to the head of pancreas in the area surrounding the common bile duct stent. Cannot rule out underlying pancreatic neoplasm. Consider further evaluation with endoscopic ultrasound with tissue sampling if indicated. 2. No signs of FDG avid liver metastasis or upper abdominal nodal metastasis. 3. There is asymmetric increased uptake within the right tonsillar region with a mildly FDG avid right level 2 lymph node. Findings may be related to upper respiratory tract viral infection. Underlying head neck neoplasm would be difficult to exclude. Correlate for any clinical signs or symptoms of recent upper respiratory tract viral infection. If clinically indicated contrast  enhanced CT or MRI of the neck and or  direct visualization is advised.   CT pancreatic protocol 12/11/2019 IMPRESSION: 1. Redemonstrated, ill-defined hypodense lesion of the superior pancreatic head, difficult to discretely measure although approximately 2.0 x 2.0 cm. This is traversed by the common bile duct and a common bile duct stent. 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 2. There is an additional cystic lesion in the pancreatic head and neck measuring approximately 2.5 x 1.5 cm. This is of uncertain significance and may reflect a pseudocyst or IPMN. 3. The pancreatic duct is nearly effaced in the central pancreatic head. There is mild dilatation of the pancreatic duct in the neck up to 5 mm. 4. Primary lesion appears to be well separated from adjacent vascular structures with the possible exception of the peripheral portion of the portal vein which may abut along a small segment of the anterior surface of the vessel. The celiac vessels, superior mesenteric artery, superior mesenteric vein, and splenic vessels do not appear to be involved. 5. No evidence of lymphadenopathy or metastatic disease in the abdomen. 6. Distended gallbladder with cholelithiasis. 7. Aortic Atherosclerosis (ICD10-I70.0).   ERCP [facility].  [facility] Spaete MD 11/29/2019 EUS Impression: - A mass was identified in the pancreatic head. Tissue was obtained from this exam, and results are pending. However, the endosonographic appearance is highly suspicious for adenocarcinoma. This was staged T2 Nx Mx by endosonographic criteria. The staging applies if malignancy is confirmed. Fine needle biopsy performed. - One stent was visualized endosonographically in the common bile duct. - Two enlarged lymph nodes were visualized in the peripancreatic region and porta hepatis region. - There was no evidence of significant pathology in the left lobe of the liver.  Cytology 11/29/2019 A. PANCREATIC HEAD MASS; EUS GUIDED FINE-NEEDLE  ASPIRATION: - POSITIVE FOR MALIGNANCY. - ADENOCARCINOMA.  Ca 19-9 98, was 167 initially with jaundice.    Allergies  No Known Drug Allergies  Allergies Reconciled   Medication History Vitamin B 12 (500MCG Tablet, Oral) Active. Metoprolol Succinate ER (50MG  Tablet ER 24HR, Oral) Active. Potassium Chloride (20MEQ Packet, Oral) Active. Lisinopril (Oral) Specific strength unknown - Active. Medications Reconciled    Review of Systems All other systems negative  Vitals  Weight: 188 lb Height: 63in Body Surface Area: 1.88 m Body Mass Index: 33.3 kg/m  Temp.: 97.74F  Pulse: 106 (Regular)  BP: 180/100(Sitting, Left Arm, Standard)       Physical Exam  General Mental Status-Alert. General Appearance-Consistent with stated age. Hydration-Well hydrated. Voice-Normal.  Head and Neck Head-normocephalic, atraumatic with no lesions or palpable masses.  Eye Sclera/Conjunctiva - Bilateral-No scleral icterus.  Chest and Lung Exam Chest and lung exam reveals -quiet, even and easy respiratory effort with no use of accessory muscles. Inspection Chest Wall - Normal. Back - normal.  Breast - Did not examine.  Cardiovascular Cardiovascular examination reveals -normal pedal pulses bilaterally. Note: regular rate and rhythm  Abdomen Inspection-Inspection Normal. Palpation/Percussion Palpation and Percussion of the abdomen reveal - Soft, Non Tender, No Rebound tenderness, No Rigidity (guarding) and No hepatosplenomegaly.  Peripheral Vascular Upper Extremity Inspection - Bilateral - Normal - No Clubbing, No Cyanosis, No Edema, Pulses Intact. Lower Extremity Palpation - Edema - Bilateral - No edema - Bilateral.  Neurologic Neurologic evaluation reveals -alert and oriented x 3 with no impairment of recent or remote memory. Mental Status-Normal.  Musculoskeletal Global Assessment -Note: no gross deformities.  Normal Exam -  Left-Upper Extremity Strength Normal and Lower Extremity Strength Normal. Normal Exam -  Right-Upper Extremity Strength Normal and Lower Extremity Strength Normal.  Lymphatic Head & Neck  General Head & Neck Lymphatics: Bilateral - Description - Normal. Axillary  General Axillary Region: Bilateral - Description - Normal. Tenderness - Non Tender.    Assessment & Plan ADENOCARCINOMA OF HEAD OF PANCREAS (C25.0) Impression: Pt has received neoadjuvant chemotherapy. I will set up [facility]. She has a good support system at home and should be able to go there post op.  Since scans are from december, I will get repeat CT c/a/p before surgery date.  I discussed the surgery with the patient including diagrams of anatomy. I discussed the potential for diagnostic laparoscopy. In the case of pancreatic cancer, if spread of the disease is found, we will abort the procedure and not proceed with resection. The rationale for this was discussed with the patient. There has not been data to support resection of Stage IV disease in terms of survival benefit.  We discussed possible complications including: Potential of aborting procedure if tumor is invading the superior mesenteric or hepatic arteries Bleeding Infection and possible wound complications such as hernia Damage to adjacent structures Leak of anastamoses, primarily pancreatic Possible need for other procedures Possible prolonged nausea with possible need for external feeding. Possible prolonged hospital stay. Possible development of diabetes or worsening of current diabetes. Possible pancreatic exocrine insufficiency Prolonged fatigue/weakness/appetite Possible early recurrence of cancer   The patient understands and wishes to proceed. The patient has been advised to turn in disability paperwork to our office. Current Plans Pt Education - [facility]ipple pt info You are being scheduled for surgery- Our schedulers will call you.  You  should hear from our office's scheduling department within 5 working days about the location, date, and time of surgery. We try to make accommodations for patient's preferences in scheduling surgery, but sometimes the OR schedule or the surgeon's schedule prevents [place] from making those accommodations.  If you have not heard from our office [phone]) in 5 working days, call the office and ask for your surgeon's nurse.  If you have other questions about your diagnosis, plan, or surgery, call the office and ask for your surgeon's nurse.  Pt Education - [facility] Colon Bowel Prep 2018 ERAS/Miralax/Antibiotics

## 2020-07-14 NOTE — Progress Notes (Signed)
Anesthesia Chart Review:  Previous eval by cardiology at [facility] clinic for report of palpitations.  Echo 10/2016 showed normal LV size and systolic function, grade 1 diastolic dysfunction, mild MR.  72-hour Holter monitor 10/2018 showed predominant sinus rhythm with occasional premature ventricular contractions.  She reportedly eliminated caffeine from her diet and her symptoms improved.    History of poorly controlled hypertension.  Blood pressure elevated at 164/86 at preadmission testing appointment.  Follows with oncology for pancreas adenocarcinoma.  She has completed 10 cycles of FOLFIRINOX chemotherapy (#10 on 05/13/2020), currently on hold to improve patient's status prior to surgery.  Preop labs reviewed, potassium mildly low at 3.2, hemoglobin chronically low, 9.3 on preop labs which is near her recent baseline.  Remainder of labs unremarkable.  EKG 09/08/2019: Normal sinus rhythm.  Rate 79.  Moderate voltage criteria for LVH, may be normal variant ( R in aVL , Cornell product ). Nonspecific T wave abnormality. Prolonged QT (QTC 504).  Echo 11/15/2016 (Care Everywhere): This result has an attachment that is not available.   Normal left ventricular systolic function, ejection fraction > 68%   Diastolic dysfunction - grade I (normal filling pressures)   Normal right ventricular systolic function   Mitral regurgitation - mild   Wynonia Musty [facility] Short Stay Center/Anesthesiology Phone [phone] 07/14/2020 9:17 AM

## 2020-07-14 NOTE — Anesthesia Preprocedure Evaluation (Addendum)
Anesthesia Evaluation  Patient identified by MRN, date of birth, ID band Patient awake    Reviewed: Allergy & Precautions, NPO status , Patient's Chart, lab work & pertinent test results, reviewed documented beta blocker date and time   History of Anesthesia Complications (+) PONV and history of anesthetic complications  Airway Mallampati: II  TM Distance: >3 FB Neck ROM: Full    Dental  (+) Teeth Intact, Dental Advisory Given   Pulmonary neg pulmonary ROS,    Pulmonary exam normal breath sounds clear to auscultation       Cardiovascular hypertension, Pt. on medications and Pt. on home beta blockers Normal cardiovascular exam Rhythm:Regular Rate:Normal     Neuro/Psych  Headaches, PSYCHIATRIC DISORDERS Anxiety    GI/Hepatic negative GI ROS, Pancreatic mass   Endo/Other  Obesity   Renal/GU negative Renal ROS     Musculoskeletal  (+) Arthritis ,   Abdominal   Peds  Hematology  (+) Blood dyscrasia, anemia , Plt 290k   Anesthesia Other Findings   Reproductive/Obstetrics                           Anesthesia Physical Anesthesia Plan  ASA: IV  Anesthesia Plan: General   Post-op Pain Management:  Regional for Post-op pain   Induction: Intravenous  PONV Risk Score and Plan: 4 or greater and Midazolam, Propofol infusion, Dexamethasone and Ondansetron  Airway Management Planned: Oral ETT  Additional Equipment: Arterial line, CVP and Ultrasound Guidance Line Placement  Intra-op Plan:   Post-operative Plan: Possible Post-op intubation/ventilation  Informed Consent: I have reviewed the patients History and Physical, chart, labs and discussed the procedure including the risks, benefits and alternatives for the proposed anesthesia with the patient or authorized representative who has indicated his/her understanding and acceptance.     Dental advisory given  Plan Discussed with:  CRNA  Anesthesia Plan Comments: (2 large bore PIV (or CVL if unable to obtain adequate access), arterial line, and epidural in pre-op.  PAT note by Karoline Caldwell, PA-C: Previous eval by cardiology at [facility] clinic for report of palpitations.  Echo 10/2016 showed normal LV size and systolic function, grade 1 diastolic dysfunction, mild MR.  72-hour Holter monitor 10/2018 showed predominant sinus rhythm with occasional premature ventricular contractions.  She reportedly eliminated caffeine from her diet and her symptoms improved.    History of poorly controlled hypertension.  Blood pressure elevated at 164/86 at preadmission testing appointment.  Follows with oncology for pancreas adenocarcinoma.  She has completed 10 cycles of FOLFIRINOX chemotherapy (#10 on 05/13/2020), currently on hold to improve patient's status prior to surgery.  Preop labs reviewed, potassium mildly low at 3.2, hemoglobin chronically low, 9.3 on preop labs which is near her recent baseline.  Remainder of labs unremarkable.  EKG 09/08/2019: Normal sinus rhythm.  Rate 79.  Moderate voltage criteria for LVH, may be normal variant ( R in aVL , Cornell product ). Nonspecific T wave abnormality. Prolonged QT (QTC 504).  Echo 11/15/2016 (Care Everywhere): This result has an attachment that is not available.   Normal left ventricular systolic function, ejection fraction > 82%   Diastolic dysfunction - grade I (normal filling pressures)   Normal right ventricular systolic function   Mitral regurgitation - mild  )      Anesthesia Quick Evaluation

## 2020-07-15 ENCOUNTER — Other Ambulatory Visit: Payer: Self-pay

## 2020-07-15 ENCOUNTER — Inpatient Hospital Stay (HOSPITAL_COMMUNITY)
Admission: RE | Admit: 2020-07-15 | Discharge: 2020-07-22 | DRG: 406 | Disposition: A | Discharge status: Home Health | Payer: Medicare Other | Attending: General Surgery | Admitting: General Surgery

## 2020-07-15 ENCOUNTER — Encounter (HOSPITAL_COMMUNITY): Payer: Self-pay | Admitting: General Surgery

## 2020-07-15 ENCOUNTER — Inpatient Hospital Stay (HOSPITAL_COMMUNITY): Payer: Medicare Other | Admitting: Vascular Surgery

## 2020-07-15 ENCOUNTER — Encounter (HOSPITAL_COMMUNITY)
Admission: RE | Disposition: A | Discharge status: Home Health | Payer: Self-pay | Source: Home / Self Care | Attending: General Surgery

## 2020-07-15 ENCOUNTER — Inpatient Hospital Stay (HOSPITAL_COMMUNITY): Payer: Medicare Other | Admitting: Anesthesiology

## 2020-07-15 ENCOUNTER — Inpatient Hospital Stay (HOSPITAL_COMMUNITY): Payer: Medicare Other

## 2020-07-15 DIAGNOSIS — E440 Moderate protein-calorie malnutrition: Secondary | ICD-10-CM | POA: Diagnosis present

## 2020-07-15 DIAGNOSIS — D62 Acute posthemorrhagic anemia: Secondary | ICD-10-CM | POA: Diagnosis not present

## 2020-07-15 DIAGNOSIS — T451X5A Adverse effect of antineoplastic and immunosuppressive drugs, initial encounter: Secondary | ICD-10-CM | POA: Diagnosis present

## 2020-07-15 DIAGNOSIS — Y833 Surgical operation with formation of external stoma as the cause of abnormal reaction of the patient, or of later complication, without mention of misadventure at the time of the procedure: Secondary | ICD-10-CM | POA: Diagnosis not present

## 2020-07-15 DIAGNOSIS — K8020 Calculus of gallbladder without cholecystitis without obstruction: Secondary | ICD-10-CM | POA: Diagnosis present

## 2020-07-15 DIAGNOSIS — Z452 Encounter for adjustment and management of vascular access device: Secondary | ICD-10-CM

## 2020-07-15 DIAGNOSIS — E861 Hypovolemia: Secondary | ICD-10-CM | POA: Diagnosis present

## 2020-07-15 DIAGNOSIS — Z6833 Body mass index (BMI) 33.0-33.9, adult: Secondary | ICD-10-CM | POA: Diagnosis not present

## 2020-07-15 DIAGNOSIS — Z803 Family history of malignant neoplasm of breast: Secondary | ICD-10-CM

## 2020-07-15 DIAGNOSIS — I959 Hypotension, unspecified: Secondary | ICD-10-CM | POA: Diagnosis not present

## 2020-07-15 DIAGNOSIS — I700 Atherosclerosis of aorta: Secondary | ICD-10-CM | POA: Diagnosis present

## 2020-07-15 DIAGNOSIS — Z8042 Family history of malignant neoplasm of prostate: Secondary | ICD-10-CM

## 2020-07-15 DIAGNOSIS — C250 Malignant neoplasm of head of pancreas: Secondary | ICD-10-CM | POA: Diagnosis present

## 2020-07-15 DIAGNOSIS — R7303 Prediabetes: Secondary | ICD-10-CM | POA: Diagnosis present

## 2020-07-15 DIAGNOSIS — E8809 Other disorders of plasma-protein metabolism, not elsewhere classified: Secondary | ICD-10-CM | POA: Diagnosis present

## 2020-07-15 DIAGNOSIS — E876 Hypokalemia: Secondary | ICD-10-CM | POA: Diagnosis not present

## 2020-07-15 DIAGNOSIS — I10 Essential (primary) hypertension: Secondary | ICD-10-CM | POA: Diagnosis present

## 2020-07-15 DIAGNOSIS — C259 Malignant neoplasm of pancreas, unspecified: Secondary | ICD-10-CM | POA: Diagnosis present

## 2020-07-15 DIAGNOSIS — K1230 Oral mucositis (ulcerative), unspecified: Secondary | ICD-10-CM | POA: Diagnosis present

## 2020-07-15 DIAGNOSIS — Z9221 Personal history of antineoplastic chemotherapy: Secondary | ICD-10-CM

## 2020-07-15 DIAGNOSIS — D630 Anemia in neoplastic disease: Secondary | ICD-10-CM | POA: Diagnosis present

## 2020-07-15 DIAGNOSIS — K219 Gastro-esophageal reflux disease without esophagitis: Secondary | ICD-10-CM | POA: Diagnosis present

## 2020-07-15 DIAGNOSIS — R8290 Unspecified abnormal findings in urine: Secondary | ICD-10-CM | POA: Diagnosis present

## 2020-07-15 DIAGNOSIS — D6481 Anemia due to antineoplastic chemotherapy: Secondary | ICD-10-CM | POA: Diagnosis present

## 2020-07-15 DIAGNOSIS — K9413 Enterostomy malfunction: Secondary | ICD-10-CM | POA: Diagnosis not present

## 2020-07-15 HISTORY — PX: WHIPPLE PROCEDURE: SHX2667

## 2020-07-15 HISTORY — PX: JEJUNOSTOMY: SHX313

## 2020-07-15 HISTORY — PX: LAPAROSCOPY: SHX197

## 2020-07-15 LAB — POCT I-STAT 7, (LYTES, BLD GAS, ICA,H+H)
Acid-Base Excess: 0 mmol/L (ref 0.0–2.0)
Acid-Base Excess: 1 mmol/L (ref 0.0–2.0)
Acid-base deficit: 1 mmol/L (ref 0.0–2.0)
Bicarbonate: 24.1 mmol/L (ref 20.0–28.0)
Bicarbonate: 22.8 mmol/L (ref 20.0–28.0)
Bicarbonate: 25.6 mmol/L (ref 20.0–28.0)
Calcium, Ion: 1.2 mmol/L (ref 1.15–1.40)
Calcium, Ion: 1.19 mmol/L (ref 1.15–1.40)
Calcium, Ion: 1.1 mmol/L — ABNORMAL LOW (ref 1.15–1.40)
HCT: 24 % — ABNORMAL LOW (ref 36.0–46.0)
HCT: 25 % — ABNORMAL LOW (ref 36.0–46.0)
HCT: 22 % — ABNORMAL LOW (ref 36.0–46.0)
Hemoglobin: 8.2 g/dL — ABNORMAL LOW (ref 12.0–15.0)
Hemoglobin: 8.5 g/dL — ABNORMAL LOW (ref 12.0–15.0)
Hemoglobin: 7.5 g/dL — ABNORMAL LOW (ref 12.0–15.0)
O2 Saturation: 100 %
O2 Saturation: 100 %
O2 Saturation: 100 %
Potassium: 3.6 mmol/L (ref 3.5–5.1)
Potassium: 3.6 mmol/L (ref 3.5–5.1)
Potassium: 3.8 mmol/L (ref 3.5–5.1)
Sodium: 137 mmol/L (ref 135–145)
Sodium: 136 mmol/L (ref 135–145)
Sodium: 137 mmol/L (ref 135–145)
TCO2: 25 mmol/L (ref 22–32)
TCO2: 24 mmol/L (ref 22–32)
TCO2: 27 mmol/L (ref 22–32)
pCO2 arterial: 38.3 mmHg (ref 32.0–48.0)
pCO2 arterial: 34.7 mmHg (ref 32.0–48.0)
pCO2 arterial: 38.1 mmHg (ref 32.0–48.0)
pH, Arterial: 7.408 (ref 7.350–7.450)
pH, Arterial: 7.426 (ref 7.350–7.450)
pH, Arterial: 7.435 (ref 7.350–7.450)
pO2, Arterial: 189 mmHg — ABNORMAL HIGH (ref 83.0–108.0)
pO2, Arterial: 225 mmHg — ABNORMAL HIGH (ref 83.0–108.0)
pO2, Arterial: 247 mmHg — ABNORMAL HIGH (ref 83.0–108.0)

## 2020-07-15 LAB — CBC
HCT: 29.4 % — ABNORMAL LOW (ref 36.0–46.0)
Hemoglobin: 9.4 g/dL — ABNORMAL LOW (ref 12.0–15.0)
MCH: 27.3 pg (ref 26.0–34.0)
MCHC: 32 g/dL (ref 30.0–36.0)
MCV: 85.5 fL (ref 80.0–100.0)
Platelets: 202 10*3/uL (ref 150–400)
RBC: 3.44 MIL/uL — ABNORMAL LOW (ref 3.87–5.11)
RDW: 14.9 % (ref 11.5–15.5)
WBC: 8.5 10*3/uL (ref 4.0–10.5)
nRBC: 0 % (ref 0.0–0.2)

## 2020-07-15 LAB — BASIC METABOLIC PANEL
Anion gap: 8 (ref 5–15)
BUN: 7 mg/dL — ABNORMAL LOW (ref 8–23)
CO2: 24 mmol/L (ref 22–32)
Calcium: 8.6 mg/dL — ABNORMAL LOW (ref 8.9–10.3)
Chloride: 104 mmol/L (ref 98–111)
Creatinine, Ser: 0.84 mg/dL (ref 0.44–1.00)
GFR, Estimated: >60 mL/min (ref >60)
Glucose, Bld: 167 mg/dL — ABNORMAL HIGH (ref 70–99)
Potassium: 3.9 mmol/L (ref 3.5–5.1)
Sodium: 136 mmol/L (ref 135–145)

## 2020-07-15 LAB — ABO/RH: ABO/RH(D): B POS

## 2020-07-15 SURGERY — WHIPPLE PROCEDURE
Anesthesia: General | Site: Abdomen

## 2020-07-15 MED ORDER — FENTANYL CITRATE (PF) 100 MCG/2ML IJ SOLN
INTRAMUSCULAR | Status: AC
  Filled 2020-07-15: qty 2

## 2020-07-15 MED ORDER — PHENYLEPHRINE 40 MCG/ML (10ML) SYRINGE FOR IV PUSH (FOR BLOOD PRESSURE SUPPORT)
PREFILLED_SYRINGE | INTRAVENOUS | Status: DC | PRN
  Administered 2020-07-15 (×2): 80 ug via INTRAVENOUS
  Administered 2020-07-15: 40 ug via INTRAVENOUS
  Administered 2020-07-15 (×3): 80 ug via INTRAVENOUS
  Administered 2020-07-15: 40 ug via INTRAVENOUS
  Administered 2020-07-15: 80 ug via INTRAVENOUS
  Administered 2020-07-15: 40 ug via INTRAVENOUS

## 2020-07-15 MED ORDER — HYDROMORPHONE 1 MG/ML IV SOLN
INTRAVENOUS | Status: DC
Start: 2020-07-15 — End: 2020-07-18
  Administered 2020-07-16: 30 mg via INTRAVENOUS
  Administered 2020-07-16: 1.2 mg via INTRAVENOUS
  Administered 2020-07-16: 0.6 mg via INTRAVENOUS
  Administered 2020-07-16: 1.1 mL via INTRAVENOUS
  Administered 2020-07-16: 0.6 mg via INTRAVENOUS
  Administered 2020-07-17: 0.2 mg via INTRAVENOUS
  Administered 2020-07-17: 0 mg via INTRAVENOUS
  Administered 2020-07-17 (×2): 0.4 mg via INTRAVENOUS
  Filled 2020-07-15: qty 30

## 2020-07-15 MED ORDER — PROPOFOL 10 MG/ML IV BOLUS
INTRAVENOUS | Status: AC
  Filled 2020-07-15: qty 20

## 2020-07-15 MED ORDER — ACETAMINOPHEN 500 MG PO TABS
1000.0000 mg | ORAL_TABLET | ORAL | Status: AC
  Administered 2020-07-15: 1000 mg via ORAL
  Filled 2020-07-15: qty 2

## 2020-07-15 MED ORDER — DIPHENHYDRAMINE HCL 50 MG/ML IJ SOLN: 12.5000 mg | Freq: Four times a day (QID) | INTRAMUSCULAR | Status: DC | PRN

## 2020-07-15 MED ORDER — ONDANSETRON HCL 4 MG/2ML IJ SOLN
INTRAMUSCULAR | Status: DC | PRN
  Administered 2020-07-15: 4 mg via INTRAVENOUS

## 2020-07-15 MED ORDER — PANTOPRAZOLE SODIUM 40 MG IV SOLR
40.0000 mg | Freq: Every day | INTRAVENOUS | Status: DC
  Administered 2020-07-15 – 2020-07-17 (×3): 40 mg via INTRAVENOUS
  Filled 2020-07-15 (×3): qty 40

## 2020-07-15 MED ORDER — ORAL CARE MOUTH RINSE: 15.0000 mL | Freq: Once | OROMUCOSAL | Status: AC

## 2020-07-15 MED ORDER — CHLORHEXIDINE GLUCONATE 0.12 % MT SOLN
15.0000 mL | Freq: Once | OROMUCOSAL | Status: AC
  Administered 2020-07-15: 15 mL via OROMUCOSAL
  Filled 2020-07-15: qty 15

## 2020-07-15 MED ORDER — FENTANYL CITRATE (PF) 100 MCG/2ML IJ SOLN
INTRAMUSCULAR | Status: DC | PRN
  Administered 2020-07-15: 50 ug via INTRAVENOUS
  Administered 2020-07-15 (×2): 25 ug via INTRAVENOUS
  Administered 2020-07-15: 50 ug via INTRAVENOUS
  Administered 2020-07-15: 25 ug via INTRAVENOUS
  Administered 2020-07-15: 50 ug via INTRAVENOUS

## 2020-07-15 MED ORDER — 0.9 % SODIUM CHLORIDE (POUR BTL) OPTIME
TOPICAL | Status: DC | PRN
  Administered 2020-07-15: 2000 mL
  Administered 2020-07-15: 1000 mL

## 2020-07-15 MED ORDER — PROCHLORPERAZINE MALEATE 10 MG PO TABS
10.0000 mg | ORAL_TABLET | Freq: Four times a day (QID) | ORAL | Status: DC | PRN
  Filled 2020-07-15: qty 1

## 2020-07-15 MED ORDER — LIDOCAINE 2% (20 MG/ML) 5 ML SYRINGE
INTRAMUSCULAR | Status: AC
  Filled 2020-07-15: qty 5

## 2020-07-15 MED ORDER — SODIUM CHLORIDE 0.9 % IV SOLN
2.0000 mL/h | Status: AC
  Administered 2020-07-15: 8 mL/h via EPIDURAL
  Filled 2020-07-15: qty 100

## 2020-07-15 MED ORDER — CHLORHEXIDINE GLUCONATE CLOTH 2 % EX PADS: 6.0000 | MEDICATED_PAD | Freq: Once | CUTANEOUS | Status: DC

## 2020-07-15 MED ORDER — PHENYLEPHRINE 40 MCG/ML (10ML) SYRINGE FOR IV PUSH (FOR BLOOD PRESSURE SUPPORT)
PREFILLED_SYRINGE | INTRAVENOUS | Status: AC
  Filled 2020-07-15: qty 10

## 2020-07-15 MED ORDER — PROPOFOL 10 MG/ML IV BOLUS
INTRAVENOUS | Status: DC | PRN
  Administered 2020-07-15: 100 mg via INTRAVENOUS

## 2020-07-15 MED ORDER — LACTATED RINGERS IV SOLN: INTRAVENOUS | Status: DC | PRN

## 2020-07-15 MED ORDER — LIDOCAINE HCL 1 % IJ SOLN
INTRAMUSCULAR | Status: DC | PRN
  Administered 2020-07-15: 7 mL

## 2020-07-15 MED ORDER — PROCHLORPERAZINE EDISYLATE 10 MG/2ML IJ SOLN: 5.0000 mg | Freq: Four times a day (QID) | INTRAMUSCULAR | Status: DC | PRN

## 2020-07-15 MED ORDER — ONDANSETRON 4 MG PO TBDP: 4.0000 mg | ORAL_TABLET | Freq: Four times a day (QID) | ORAL | Status: DC | PRN

## 2020-07-15 MED ORDER — FENTANYL CITRATE (PF) 100 MCG/2ML IJ SOLN
25.0000 ug | INTRAMUSCULAR | Status: DC | PRN
  Administered 2020-07-15 (×2): 50 ug via INTRAVENOUS

## 2020-07-15 MED ORDER — WATER FOR IRRIGATION, STERILE IR SOLN
Status: DC | PRN
  Administered 2020-07-15 (×2): 1000 mL

## 2020-07-15 MED ORDER — ENSURE PRE-SURGERY PO LIQD: 296.0000 mL | Freq: Once | ORAL | Status: DC

## 2020-07-15 MED ORDER — ROCURONIUM BROMIDE 10 MG/ML (PF) SYRINGE
PREFILLED_SYRINGE | INTRAVENOUS | Status: AC
  Filled 2020-07-15: qty 10

## 2020-07-15 MED ORDER — BUPIVACAINE-EPINEPHRINE (PF) 0.25% -1:200000 IJ SOLN
INTRAMUSCULAR | Status: AC
  Filled 2020-07-15: qty 30

## 2020-07-15 MED ORDER — ACETAMINOPHEN 10 MG/ML IV SOLN
1000.0000 mg | Freq: Four times a day (QID) | INTRAVENOUS | Status: AC
  Administered 2020-07-15 – 2020-07-16 (×4): 1000 mg via INTRAVENOUS
  Filled 2020-07-15 (×4): qty 100

## 2020-07-15 MED ORDER — DIPHENHYDRAMINE HCL 12.5 MG/5ML PO ELIX: 12.5000 mg | ORAL_SOLUTION | Freq: Four times a day (QID) | ORAL | Status: DC | PRN

## 2020-07-15 MED ORDER — ONDANSETRON HCL 4 MG/2ML IJ SOLN
INTRAMUSCULAR | Status: AC
  Filled 2020-07-15: qty 2

## 2020-07-15 MED ORDER — SODIUM CHLORIDE 0.9 % IV SOLN: INTRAVENOUS | Status: DC | PRN

## 2020-07-15 MED ORDER — ROPIVACAINE HCL 2 MG/ML IJ SOLN
6.0000 mL/h | INTRAMUSCULAR | Status: DC
  Administered 2020-07-15: 8 mL/h via EPIDURAL
  Administered 2020-07-17 – 2020-07-18 (×2): 6 mL/h via EPIDURAL
  Filled 2020-07-15 (×8): qty 200

## 2020-07-15 MED ORDER — LIDOCAINE 2% (20 MG/ML) 5 ML SYRINGE
INTRAMUSCULAR | Status: DC | PRN
  Administered 2020-07-15: 40 mg via INTRAVENOUS

## 2020-07-15 MED ORDER — HYDRALAZINE HCL 20 MG/ML IJ SOLN: 10.0000 mg | INTRAMUSCULAR | Status: DC | PRN

## 2020-07-15 MED ORDER — ENSURE PRE-SURGERY PO LIQD: 592.0000 mL | Freq: Once | ORAL | Status: DC

## 2020-07-15 MED ORDER — KCL IN DEXTROSE-NACL 40-5-0.45 MEQ/L-%-% IV SOLN
INTRAVENOUS | Status: DC
  Filled 2020-07-15 (×5): qty 1000

## 2020-07-15 MED ORDER — DEXAMETHASONE SODIUM PHOSPHATE 10 MG/ML IJ SOLN
INTRAMUSCULAR | Status: AC
  Filled 2020-07-15: qty 1

## 2020-07-15 MED ORDER — ONDANSETRON HCL 4 MG/2ML IJ SOLN
4.0000 mg | Freq: Four times a day (QID) | INTRAMUSCULAR | Status: DC | PRN
  Administered 2020-07-20: 4 mg via INTRAVENOUS
  Filled 2020-07-15: qty 2

## 2020-07-15 MED ORDER — NALOXONE HCL 0.4 MG/ML IJ SOLN: 0.4000 mg | INTRAMUSCULAR | Status: DC | PRN

## 2020-07-15 MED ORDER — VISTASEAL 10 ML SINGLE DOSE KIT
10.0000 mL | PACK | Freq: Once | CUTANEOUS | Status: AC
  Administered 2020-07-15: 10 mL via TOPICAL
  Filled 2020-07-15: qty 10

## 2020-07-15 MED ORDER — ONDANSETRON HCL 4 MG/2ML IJ SOLN: 4.0000 mg | Freq: Once | INTRAMUSCULAR | Status: DC | PRN

## 2020-07-15 MED ORDER — METOPROLOL TARTRATE 5 MG/5ML IV SOLN
5.0000 mg | Freq: Four times a day (QID) | INTRAVENOUS | Status: AC
  Administered 2020-07-15 – 2020-07-16 (×2): 5 mg via INTRAVENOUS
  Filled 2020-07-15 (×2): qty 5

## 2020-07-15 MED ORDER — MIDAZOLAM HCL 2 MG/2ML IJ SOLN
INTRAMUSCULAR | Status: AC
  Filled 2020-07-15: qty 2

## 2020-07-15 MED ORDER — SODIUM CHLORIDE (PF) 0.9 % IJ SOLN
INTRAMUSCULAR | Status: AC
  Filled 2020-07-15: qty 10

## 2020-07-15 MED ORDER — SUGAMMADEX SODIUM 200 MG/2ML IV SOLN
INTRAVENOUS | Status: DC | PRN
  Administered 2020-07-15: 200 mg via INTRAVENOUS

## 2020-07-15 MED ORDER — LACTATED RINGERS IV SOLN: INTRAVENOUS | Status: DC

## 2020-07-15 MED ORDER — LIDOCAINE HCL (PF) 1 % IJ SOLN
INTRAMUSCULAR | Status: AC
  Filled 2020-07-15: qty 30

## 2020-07-15 MED ORDER — MIDAZOLAM HCL 5 MG/5ML IJ SOLN
INTRAMUSCULAR | Status: DC | PRN
  Administered 2020-07-15 (×3): 1 mg via INTRAVENOUS

## 2020-07-15 MED ORDER — PHENYLEPHRINE HCL-NACL 10-0.9 MG/250ML-% IV SOLN
INTRAVENOUS | Status: DC | PRN
  Administered 2020-07-15: 30 ug/min via INTRAVENOUS

## 2020-07-15 MED ORDER — SODIUM CHLORIDE 0.9% FLUSH: 9.0000 mL | INTRAVENOUS | Status: DC | PRN

## 2020-07-15 MED ORDER — ALBUMIN HUMAN 5 % IV SOLN: INTRAVENOUS | Status: DC | PRN

## 2020-07-15 MED ORDER — ONDANSETRON HCL 4 MG/2ML IJ SOLN
4.0000 mg | Freq: Four times a day (QID) | INTRAMUSCULAR | Status: DC | PRN
  Administered 2020-07-15: 4 mg via INTRAVENOUS
  Filled 2020-07-15: qty 2

## 2020-07-15 MED ORDER — DEXAMETHASONE SODIUM PHOSPHATE 10 MG/ML IJ SOLN
INTRAMUSCULAR | Status: DC | PRN
  Administered 2020-07-15: 10 mg via INTRAVENOUS

## 2020-07-15 MED ORDER — PHENYLEPHRINE HCL (PRESSORS) 10 MG/ML IV SOLN
INTRAVENOUS | Status: AC
  Filled 2020-07-15: qty 2

## 2020-07-15 MED ORDER — METHOCARBAMOL 1000 MG/10ML IJ SOLN
500.0000 mg | Freq: Three times a day (TID) | INTRAVENOUS | Status: DC | PRN
  Administered 2020-07-16 – 2020-07-17 (×3): 500 mg via INTRAVENOUS
  Filled 2020-07-15 (×4): qty 5

## 2020-07-15 MED ORDER — ROCURONIUM BROMIDE 100 MG/10ML IV SOLN
INTRAVENOUS | Status: DC | PRN
  Administered 2020-07-15: 30 mg via INTRAVENOUS
  Administered 2020-07-15 (×2): 20 mg via INTRAVENOUS
  Administered 2020-07-15: 60 mg via INTRAVENOUS
  Administered 2020-07-15: 30 mg via INTRAVENOUS

## 2020-07-15 MED ORDER — PROPOFOL 500 MG/50ML IV EMUL
INTRAVENOUS | Status: DC | PRN
  Administered 2020-07-15: 25 ug/kg/min via INTRAVENOUS

## 2020-07-15 MED ORDER — DIPHENHYDRAMINE HCL 12.5 MG/5ML PO ELIX
12.5000 mg | ORAL_SOLUTION | Freq: Four times a day (QID) | ORAL | Status: DC | PRN
  Filled 2020-07-15: qty 5

## 2020-07-15 MED ORDER — CEFAZOLIN SODIUM-DEXTROSE 2-4 GM/100ML-% IV SOLN
2.0000 g | Freq: Three times a day (TID) | INTRAVENOUS | Status: AC
  Administered 2020-07-15: 2 g via INTRAVENOUS
  Filled 2020-07-15: qty 100

## 2020-07-15 MED ORDER — CEFAZOLIN SODIUM-DEXTROSE 2-4 GM/100ML-% IV SOLN
2.0000 g | INTRAVENOUS | Status: AC
  Administered 2020-07-15 (×2): 2 g via INTRAVENOUS
  Filled 2020-07-15: qty 100

## 2020-07-15 MED ORDER — CEFAZOLIN SODIUM 1 G IJ SOLR
INTRAMUSCULAR | Status: AC
  Filled 2020-07-15: qty 20

## 2020-07-15 MED ORDER — FENTANYL CITRATE (PF) 250 MCG/5ML IJ SOLN
INTRAMUSCULAR | Status: AC
  Filled 2020-07-15: qty 5

## 2020-07-15 SURGICAL SUPPLY — 126 items
ADH SKN CLS APL DERMABOND .7 (GAUZE/BANDAGES/DRESSINGS)
APL PRP STRL LF DISP 70% ISPRP (MISCELLANEOUS) ×2
BAG BILE T-TUBES STRL (MISCELLANEOUS) ×8 IMPLANT
BAG DRN 9.5 2 ADJ BELT ADPR (MISCELLANEOUS) ×4
BIOPATCH RED 1 DISK 7.0 (GAUZE/BANDAGES/DRESSINGS) ×6 IMPLANT
BIOPATCH RED 1IN DISK 7.0MM (GAUZE/BANDAGES/DRESSINGS) ×2
BLADE CLIPPER SURG (BLADE) IMPLANT
BLADE SURG 10 STRL SS (BLADE) ×4 IMPLANT
BOOT SUTURE AID YELLOW STND (SUTURE) ×8 IMPLANT
BRR ADH 5X3 SEPRAFILM 6 SHT (MISCELLANEOUS)
CANISTER SUCT 3000ML PPV (MISCELLANEOUS) IMPLANT
CATH KIT ON-Q SILVERSOAK 7.5IN (CATHETERS) IMPLANT
CATH ROBINSON RED A/P 16FR (CATHETERS) IMPLANT
CHLORAPREP W/TINT 26 (MISCELLANEOUS) ×4 IMPLANT
CLIP VESOCCLUDE LG 6/CT (CLIP) ×4 IMPLANT
CLIP VESOCCLUDE MED 24/CT (CLIP) ×4 IMPLANT
CLIP VESOLOCK LG 6/CT PURPLE (CLIP) ×16 IMPLANT
CLIP VESOLOCK MED 6/CT (CLIP) ×8 IMPLANT
CLIP VESOLOCK MED LG 6/CT (CLIP) ×4 IMPLANT
CNTNR URN SCR LID CUP LEK RST (MISCELLANEOUS) IMPLANT
CONT SPEC 4OZ STRL OR WHT (MISCELLANEOUS)
COUNTER NEEDLE 20 DBL MAG RED (NEEDLE) ×4 IMPLANT
COVER MAYO STAND STRL (DRAPES) ×4 IMPLANT
COVER SURGICAL LIGHT HANDLE (MISCELLANEOUS) ×4 IMPLANT
COVER WAND RF STERILE (DRAPES) ×4 IMPLANT
DECANTER SPIKE VIAL GLASS SM (MISCELLANEOUS) ×4 IMPLANT
DERMABOND ADVANCED (GAUZE/BANDAGES/DRESSINGS)
DERMABOND ADVANCED .7 DNX12 (GAUZE/BANDAGES/DRESSINGS) IMPLANT
DRAIN CHANNEL 19F RND (DRAIN) ×8 IMPLANT
DRAIN PENROSE 0.5X18 (DRAIN) IMPLANT
DRAPE LAPAROSCOPIC ABDOMINAL (DRAPES) IMPLANT
DRAPE WARM FLUID 44X44 (DRAPES) ×4 IMPLANT
DRSG COVADERM 4X10 (GAUZE/BANDAGES/DRESSINGS) IMPLANT
DRSG COVADERM 4X14 (GAUZE/BANDAGES/DRESSINGS) ×4 IMPLANT
DRSG COVADERM 4X6 (GAUZE/BANDAGES/DRESSINGS) IMPLANT
DRSG COVADERM 4X8 (GAUZE/BANDAGES/DRESSINGS) IMPLANT
DRSG COVADERM PLUS 2X2 (GAUZE/BANDAGES/DRESSINGS) ×4 IMPLANT
DRSG TEGADERM 4X10 (GAUZE/BANDAGES/DRESSINGS) ×4 IMPLANT
DRSG TEGADERM 4X4.75 (GAUZE/BANDAGES/DRESSINGS) IMPLANT
DRSG TELFA 3X8 NADH (GAUZE/BANDAGES/DRESSINGS) IMPLANT
ELECT BLADE 4.0 EZ CLEAN MEGAD (MISCELLANEOUS) ×4
ELECT BLADE 6.5 EXT (BLADE) ×4 IMPLANT
ELECT CAUTERY BLADE 6.4 (BLADE) IMPLANT
ELECT REM PT RETURN 9FT ADLT (ELECTROSURGICAL) ×4
ELECTRODE BLDE 4.0 EZ CLN MEGD (MISCELLANEOUS) ×2 IMPLANT
ELECTRODE REM PT RTRN 9FT ADLT (ELECTROSURGICAL) ×2 IMPLANT
GAUZE 4X4 16PLY RFD (DISPOSABLE) IMPLANT
GAUZE SPONGE 4X4 12PLY STRL (GAUZE/BANDAGES/DRESSINGS) IMPLANT
GEL ULTRASOUND 20GR AQUASONIC (MISCELLANEOUS) IMPLANT
GLOVE BIO SURGEON STRL SZ 6 (GLOVE) ×8 IMPLANT
GLOVE SURG UNDER LTX SZ6.5 (GLOVE) ×8 IMPLANT
GOWN STRL REUS W/ TWL LRG LVL3 (GOWN DISPOSABLE) ×4 IMPLANT
GOWN STRL REUS W/TWL 2XL LVL3 (GOWN DISPOSABLE) ×8 IMPLANT
GOWN STRL REUS W/TWL LRG LVL3 (GOWN DISPOSABLE) ×8
HANDLE SUCTION POOLE (INSTRUMENTS) IMPLANT
HEMOSTAT SURGICEL 2X14 (HEMOSTASIS) IMPLANT
KIT BASIN OR (CUSTOM PROCEDURE TRAY) ×4 IMPLANT
KIT MARKER MARGIN INK (KITS) ×4 IMPLANT
KIT TUBE JEJUNAL 16FR (CATHETERS) ×4 IMPLANT
KIT TURNOVER KIT B (KITS) ×4 IMPLANT
L-HOOK LAP DISP 36CM (ELECTROSURGICAL) ×4
LHOOK LAP DISP 36CM (ELECTROSURGICAL) ×2 IMPLANT
LOOP VESSEL MAXI BLUE (MISCELLANEOUS) ×4 IMPLANT
LOOP VESSEL MINI RED (MISCELLANEOUS) ×4 IMPLANT
NEEDLE 22X1 1/2 (OR ONLY) (NEEDLE) ×4 IMPLANT
NS IRRIG 1000ML POUR BTL (IV SOLUTION) ×12 IMPLANT
PACK GENERAL/GYN (CUSTOM PROCEDURE TRAY) IMPLANT
PAD ARMBOARD 7.5X6 YLW CONV (MISCELLANEOUS) ×8 IMPLANT
PENCIL BUTTON HOLSTER BLD 10FT (ELECTRODE) IMPLANT
PENCIL SMOKE EVACUATOR (MISCELLANEOUS) ×4 IMPLANT
PLUG CATH AND CAP STER (CATHETERS) IMPLANT
RELOAD PROXIMATE 75MM BLUE (ENDOMECHANICALS) ×12 IMPLANT
RELOAD PROXIMATE 75MM GREEN (ENDOMECHANICALS) IMPLANT
SCISSORS LAP 5X35 DISP (ENDOMECHANICALS) IMPLANT
SEPRAFILM PROCEDURAL PACK 3X5 (MISCELLANEOUS) IMPLANT
SET IRRIG TUBING LAPAROSCOPIC (IRRIGATION / IRRIGATOR) IMPLANT
SET TUBE SMOKE EVAC HIGH FLOW (TUBING) ×4 IMPLANT
SHEARS FOC LG CVD HARMONIC 17C (MISCELLANEOUS) ×4 IMPLANT
SLEEVE ENDOPATH XCEL 5M (ENDOMECHANICALS) ×4 IMPLANT
SLEEVE SUCTION 125 (MISCELLANEOUS) IMPLANT
SLEEVE SUCTION CATH 165 (SLEEVE) IMPLANT
SPONGE INTESTINAL PEANUT (DISPOSABLE) IMPLANT
SPONGE LAP 18X18 RF (DISPOSABLE) ×20 IMPLANT
SPONGE SURGIFOAM ABS GEL 100 (HEMOSTASIS) IMPLANT
STAPLER PROXIMATE 75MM BLUE (STAPLE) ×4 IMPLANT
STAPLER VISISTAT 35W (STAPLE) ×4 IMPLANT
SUCTION POOLE HANDLE (INSTRUMENTS)
SUT 5.0 PDS RB-1 (SUTURE) ×10
SUT ETHILON 2 0 FS 18 (SUTURE) ×16 IMPLANT
SUT ETHILON 2 LR (SUTURE) IMPLANT
SUT MNCRL AB 4-0 PS2 18 (SUTURE) IMPLANT
SUT PDS AB 1 TP1 96 (SUTURE) ×8 IMPLANT
SUT PDS AB 3-0 SH 27 (SUTURE) ×16 IMPLANT
SUT PDS AB 4-0 RB1 27 (SUTURE) ×56 IMPLANT
SUT PDS PLUS AB 5-0 RB-1 (SUTURE) ×10 IMPLANT
SUT PROLENE 3 0 SH 48 (SUTURE) ×8 IMPLANT
SUT PROLENE 4 0 RB 1 (SUTURE) ×8
SUT PROLENE 4-0 RB1 .5 CRCL 36 (SUTURE) ×4 IMPLANT
SUT PROLENE 5 0 RB 1 DA (SUTURE) IMPLANT
SUT SILK 2 0 SH CR/8 (SUTURE) ×12 IMPLANT
SUT SILK 2 0 TIES 10X30 (SUTURE) ×4 IMPLANT
SUT SILK 3 0 SH CR/8 (SUTURE) ×4 IMPLANT
SUT SILK 3 0 TIES 10X30 (SUTURE) ×4 IMPLANT
SUT VIC AB 2-0 CT1 27 (SUTURE) ×4
SUT VIC AB 2-0 CT1 TAPERPNT 27 (SUTURE) ×2 IMPLANT
SUT VIC AB 2-0 SH 18 (SUTURE) IMPLANT
SUT VIC AB 3-0 SH 18 (SUTURE) IMPLANT
SUT VIC AB 3-0 SH 27 (SUTURE) ×4
SUT VIC AB 3-0 SH 27X BRD (SUTURE) ×2 IMPLANT
SUT VICRYL AB 2 0 TIES (SUTURE) IMPLANT
SYR BULB IRRIG 60ML STRL (SYRINGE) ×4 IMPLANT
TAPE UMBILICAL 1/8 X36 TWILL (MISCELLANEOUS) IMPLANT
TOWEL GREEN STERILE (TOWEL DISPOSABLE) ×4 IMPLANT
TOWEL GREEN STERILE FF (TOWEL DISPOSABLE) ×4 IMPLANT
TRAY FOLEY MTR SLVR 14FR STAT (SET/KITS/TRAYS/PACK) ×4 IMPLANT
TRAY LAPAROSCOPIC MC (CUSTOM PROCEDURE TRAY) ×4 IMPLANT
TROCAR XCEL BLUNT TIP 100MML (ENDOMECHANICALS) IMPLANT
TROCAR XCEL NON-BLD 11X100MML (ENDOMECHANICALS) IMPLANT
TROCAR XCEL NON-BLD 5MMX100MML (ENDOMECHANICALS) ×4 IMPLANT
TUBE CONNECTING 12'X1/4 (SUCTIONS) ×1
TUBE CONNECTING 12X1/4 (SUCTIONS) ×3 IMPLANT
TUBE FEEDING 8FR 16IN STR KANG (MISCELLANEOUS) IMPLANT
TUBE FEEDING ENTERAL 5FR 16IN (TUBING) ×4 IMPLANT
TUNNELER SHEATH ON-Q 16GX12 DP (PAIN MANAGEMENT) IMPLANT
WARMER LAPAROSCOPE (MISCELLANEOUS) ×4 IMPLANT
YANKAUER SUCT BULB TIP NO VENT (SUCTIONS) ×4 IMPLANT

## 2020-07-15 NOTE — Anesthesia Procedure Notes (Addendum)
Procedure Name: Intubation Date/Time: 07/15/2020 8:05 AM Performed by: Janene Harvey, CRNA Pre-anesthesia Checklist: Patient identified, Emergency Drugs available, Suction available and Patient being monitored Patient Re-evaluated:Patient Re-evaluated prior to induction Oxygen Delivery Method: Circle system utilized Preoxygenation: Pre-oxygenation with 100% oxygen Induction Type: IV induction Ventilation: Mask ventilation without difficulty Laryngoscope Size: Mac and 3 Grade View: Grade II Tube type: Oral Tube size: 7.0 mm Number of attempts: 1 Airway Equipment and Method: Stylet and Oral airway Placement Confirmation: ETT inserted through vocal cords under direct vision,  positive ETCO2 and breath sounds checked- equal and bilateral Secured at: 20 cm Tube secured with: Tape Dental Injury: Teeth and Oropharynx as per pre-operative assessment  Comments: Intubation by [facility]

## 2020-07-15 NOTE — Anesthesia Postprocedure Evaluation (Signed)
Anesthesia Post Note  Patient: Robin Daniels  Procedure(s) Performed: WHIPPLE PROCEDURE (N/A Abdomen) DIAGNOSTIC LAPAROSCOPY (N/A Abdomen) JEJUNOSTOMY (Left Abdomen)     Patient location during evaluation: PACU Anesthesia Type: General Level of consciousness: awake and alert Pain management: pain level controlled Vital Signs Assessment: post-procedure vital signs reviewed and stable Respiratory status: spontaneous breathing, nonlabored ventilation, respiratory function stable and patient connected to nasal cannula oxygen Cardiovascular status: blood pressure returned to baseline and stable Postop Assessment: no apparent nausea or vomiting Anesthetic complications: no   No complications documented.  Last Vitals:  Vitals:   07/15/20 1415 07/15/20 1430  BP: 120/71 123/69  Pulse: 72 75  Resp: 18 (!) 26  Temp:    SpO2: 100% 100%    Last Pain:  Vitals:   07/15/20 1430  TempSrc:   PainSc: 0-No pain                 Catalina Gravel

## 2020-07-15 NOTE — Anesthesia Procedure Notes (Signed)
Central Venous Catheter Insertion Performed by: Catalina Gravel, MD, anesthesiologist Start/End4/19/2022 7:00 AM, 07/15/2020 7:10 AM Preanesthetic checklist: patient identified, IV checked, site marked, risks and benefits discussed, surgical consent, monitors and equipment checked, pre-op evaluation, timeout performed and anesthesia consent Position: Trendelenburg Lidocaine 1% used for infiltration and patient sedated Hand hygiene performed , maximum sterile barriers used  and Seldinger technique used Catheter size: 8 Fr Total catheter length 16. Central line was placed.Double lumen Procedure performed using ultrasound guided technique. Ultrasound Notes:anatomy identified, needle tip was noted to be adjacent to the nerve/plexus identified, no ultrasound evidence of intravascular and/or intraneural injection and image(s) printed for medical record Attempts: 1 Following insertion, line sutured, dressing applied and Biopatch. Post procedure assessment: blood return through all ports, free fluid flow and no air  Patient tolerated the procedure well with no immediate complications.

## 2020-07-15 NOTE — Anesthesia Procedure Notes (Signed)
Epidural Patient location during procedure: pre-op Start time: 07/15/2020 7:20 AM End time: 07/15/2020 7:30 AM  Staffing Anesthesiologist: Catalina Gravel, MD Performed: anesthesiologist   Preanesthetic Checklist Completed: patient identified, IV checked, site marked, risks and benefits discussed, surgical consent, monitors and equipment checked, pre-op evaluation and timeout performed  Epidural Patient position: sitting Prep: DuraPrep Patient monitoring: blood pressure and continuous pulse ox Approach: midline Location: thoracic (1-12) (T8) Injection technique: LOR air  Needle:  Needle type: Tuohy  Needle gauge: 17 G Needle length: 9 cm Needle insertion depth: 4 cm Catheter size: 19 Gauge Catheter at skin depth: 9 cm Test dose: negative and Other (1% Lidocaine)  Additional Notes Patient identified.  Risk benefits discussed including failed block, incomplete pain control, headache, nerve damage, paralysis, blood pressure changes, nausea, vomiting, reactions to medication both toxic or allergic, and postpartum back pain.  Patient expressed understanding and wished to proceed.  All questions were answered.  Sterile technique used throughout procedure and epidural site dressed with sterile barrier dressing. No paresthesia or other complications noted. The patient did not experience any signs of intravascular injection such as tinnitus or metallic taste in mouth nor signs of intrathecal spread such as rapid motor block. Please see nursing notes for vital signs. Reason for block:at surgeon's request, post-op pain management and procedure for pain

## 2020-07-15 NOTE — Addendum Note (Signed)
Addendum  created 07/15/20 1506 by Catalina Gravel, MD   Order list changed, Pharmacy for encounter modified

## 2020-07-15 NOTE — Progress Notes (Addendum)
On call Dr Kae Heller paged from Trauma services to clarify pain control orders. Patient has an epidural infusion of Naropin at 65mL/hr currently infusing. Orders to add Dilaudid PCA also active. Patient states pain is controlled Pain 2/10. Will await response from MD for clarification.   2220: Per Dr. Kae Heller okay to provide both medications. PCA pump ordered from supplies services. PCA to be set up when equipment available.

## 2020-07-15 NOTE — Anesthesia Procedure Notes (Signed)
Arterial Line Insertion Start/End4/19/2022 7:10 AM Performed by: Janene Harvey, CRNA  Patient location: Pre-op. Preanesthetic checklist: patient identified, IV checked, risks and benefits discussed and pre-op evaluation Lidocaine 1% used for infiltration and patient sedated Right, radial was placed Catheter size: 20 G Hand hygiene performed  and maximum sterile barriers used  Allen's test indicative of satisfactory collateral circulation Attempts: 2 Procedure performed without using ultrasound guided technique. Following insertion, dressing applied and Biopatch. Post procedure assessment: normal  Patient tolerated the procedure well with no immediate complications. Additional procedure comments: Attempted by SRNA x1, attempt x1 by CRNA successful.

## 2020-07-15 NOTE — Interval H&P Note (Signed)
History and Physical Interval Note:  07/15/2020 7:44 AM  Robin Daniels  has presented today for surgery, with the diagnosis of PANCREATIC CANCER.  The various methods of treatment have been discussed with the patient and family. After consideration of risks, benefits and other options for treatment, the patient has consented to  Procedure(s): WHIPPLE PROCEDURE (N/A) LAPAROSCOPY DIAGNOSTIC WITH POSSIBLE FEEDING TUBE (N/A) as a surgical intervention.  The patient's history has been reviewed, patient examined, no change in status, stable for surgery.  I have reviewed the patient's chart and labs.  Questions were answered to the patient's satisfaction.     Stark Klein

## 2020-07-15 NOTE — Op Note (Signed)
PREOPERATIVE DIAGNOSIS: adenocarcinoma of the pancreatic head, uT2N1, now s/p neoadjuvant chemotherapy  POSTOPERATIVE DIAGNOSIS: Same.   PROCEDURES PERFORMED:   Diagnostic laparoscopy  Classic pancreaticoduodenectomy   Placement of pancreatic duct stent   Feeding tube  SURGEON: Stark Klein, MD   ASSISTANT: Caryn Bee, MD   ANESTHESIA: General and epidural   FINDINGS:  Red distended firm gallbladder filled with pus.  3 cm pancreatic head mass. Soft pancreatic tissue. 14 mm common bile duct. 3 mm pancreatic duct  SPECIMENS:  1. Pancreaticoduodenectomy with gallbladder:  2. Portal nodes  3. Culture of gallbladder pus to microbiology  ESTIMATED BLOOD LOSS: 400 mL.   COMPLICATIONS: None known.   PROCEDURE:   Pt was identified in the holding area and taken to  the operating room, and placed supine on the operating room  table. General anesthesia was induced. The patient's abdomen was  prepped and draped in a sterile fashion, after a Foley catheter was  placed. A time-out was performed according to the surgical safety check  list. When all was correct we continued.   The patient was placed in reverse trendelenburg position and rotated to the right.  The left subcostal margin was anesthetized with local anesthesia.  A 5 mm optiview trocar was placed under direct visualization.  The abdomen was insufflated with carbon dioxide.  The abdomen was examined.  A second port was placed in the upper midline to be able to better visualize the right liver.  No evidence of carcinomatosis was seen.    A midline incision was made from the xiphoid to just above the umbilicus. The subcutaneous tissues were divided with the Bovie cautery. The peritoneum was entered in the center of the abdomen. Digital retraction was then used to elevate the preperitoneal fat, and this was taken with the cautery as well. The subcutaneous tissues and fascia of the muscular layers were taken laterally with the  cautery.  Care was taken to protect the underlying viscera.   The Bookwalter self-retaining retractor was placed to assist with visualization.  The porta was identified. The duodenum was kocherized extensively with blunt dissection and with cautery. The gallbladder was taken off the liver with a combination of blunt dissection and cautery. The infundibulum was densely adherent to the common bile duct.  There was spillage of a dramatic amount of pus.  This was cultured. These adhesions were divided bluntly.  The cystic duct was tied off.  The cystic duct was divided and the gallbladder was passed off.   The lesser sac was opened and the omentum was taken off and brought superiorly with the stomach.  The common bile duct was skeletonized near the duodenum. A vessel loop was passed around it. The gastroduodenal artery, as well as the common hepatic artery were skeletonized. The proper hepatic artery was traced out to make sure that flow was going to both sides of the liver when the GDA was clamped. The GDA was test clamped with the bulldog, with good flow to the liver and no signs of ischemia. This was divided with 2-0 silk ties and then clipped. The proper hepatic artery was reflected upward, and the anterior portal vein was exposed. A Kelly clamp was passed underneath the pancreas at the superior mesenteric vein, and this passed easily with no signs of tumor involvement.   Attention was then directed to the stomach, and the omentum was taken  off of the stomach at the border of the antrum and the body. The  gastrohepatic ligament was  taken down with the harmonic, and care was  taken to make sure there was not a replaced left hepatic artery in this  location. The stomach was divided with the GIA-75 stapler. The border  of the stomach was oversewn with a 3-0 running PDS suture.   Attention was then directed to the small bowel. Around 10 cm past the  ligament of Treitz was located, and this was divided  with the 75-GIA.  The distal portion of the jejunum was also oversewn with a 3-0 PDS  suture. The fourth portion of the duodenum was skeletonized with the  harmonic scalpel, taking down all of the mesenteric vessels. The  ligament of Treitz was taken down. The IMV was preserved.  The duodenum was then passed underneath the portal vein.   2-0 silk sutures were tied down and the inferior and superior border of the pancreas. At this point the Claiborne Billings was replaced and the pancreas was divided with the cautery.  The Bovie was used to coagulate the small bleeders at the border of the pancreas. The Overholt in combination with the harmonic and locking Weck clips were then used to take the uncinate process off of the portal vein and  the superior mesenteric artery. Care was taken not to incorporate the  superior mesenteric artery in the dissection. The specimen was then marked and passed off the table for frozen section margin.   At this point the frozens returned back as all negative.  The jejunum was then passed underneath the SMV in order to get appropriate lie for the pancreatic and biliary anastomoses. The more distal portion of the jejunum was pulled up over  the colon, and two 3-0 silks were placed through the posterior border  of the stomach for the gastrojejunostomy. The stomach and the small  bowel were opened, and a GIA-75 was used to create an end-to-end  anastomosis. The open areas of the staple line were examined to ensure  that there was hemostasis. The defect was then closed with a single  layer of running Connell suture of 3-0 PDS. Prior to a complete  closure, the NG tube was passed toward the afferent limb.   The appropriate location for the choledochojejunostomy was identified, and  the small bowel was opened approximately 15 mm. The anastamosis was created with approximately twelve 4-0 interrupted PDS sutures.   The 2 corner sutures were placed first  and then the posterior layer  was done in an interrupted fashion tying on  the inside. The superior layer was then closed with interrupted sutures as  well.    The pancreatic anastomosis was then created. The pancreas was of medium texture, and the duct was 3 mm. A pediatric feeding tube was used as a pancreatic stent. The posterior layer was formed first with 2-0 silk sutures in interrupted fashion. Five 5-0 PDS sutures were used to create a duct-to-mucosa anastamosis.  The anterior layer was then oversewn with 2-0 silks to dunk the pancreatic parenchyma.   A jejunostomy tube was placed in the left mid abdomen.  The 16 Fr replacement J tube was used.  It was passed through the abdominal wall. A pursestring suture was placed in the jejunum.  The bowel was opened.  The tube was passed distally.  1.5 mL saline was placed into the balloon.  The pursestring suture was tied down.  Three Witzel sutures were placed.  The jejunum was then pexed to the abdominal wall with 2-0 silk sutures to minimize risk of  volvulus.  The areas were then irrigated and then those anastomoses were covered  with Vistaseal.  This was allowed to dry. The abdomen was then irrigated  again and all the laparotomy sponges were removed. A lap count was  performed, which was correct. Two 19-Blake drains were placed, with the  lateral-most drain placed behind the choledochojejunostomy. The medial  Blake drain was placed just anterior and slightly superior to the  pancreaticojejunostomy.    The fascia was then closed with #1 looped running PDS sutures. The skin was irrigated and then closed with staples. The wounds were cleaned, dried and dressed with a sterile  dressing.   The patient tolerated the procedure well and was extubated and taken to  PACU in stable condition. Needle and sponge counts were correct x2.

## 2020-07-15 NOTE — Transfer of Care (Signed)
Immediate Anesthesia Transfer of Care Note  Patient: Robin Daniels  Procedure(s) Performed: WHIPPLE PROCEDURE (N/A Abdomen) DIAGNOSTIC LAPAROSCOPY (N/A Abdomen) JEJUNOSTOMY (Left Abdomen)  Patient Location: PACU  Anesthesia Type:General and Regional  Level of Consciousness: drowsy and patient cooperative  Airway & Oxygen Therapy: Patient Spontanous Breathing and Patient connected to face mask oxygen  Post-op Assessment: Report given to RN and Post -op Vital signs reviewed and stable  Post vital signs: Reviewed  Last Vitals:  Vitals Value Taken Time  BP 120/70 07/15/20 1359  Temp    Pulse 76 07/15/20 1404  Resp 19 07/15/20 1404  SpO2 100 % 07/15/20 1404  Vitals shown include unvalidated device data.  Last Pain:  Vitals:   07/15/20 0558  TempSrc: Oral  PainSc:          Complications: No complications documented.

## 2020-07-16 ENCOUNTER — Encounter (HOSPITAL_COMMUNITY): Payer: Self-pay | Admitting: Anesthesiology

## 2020-07-16 ENCOUNTER — Encounter (HOSPITAL_COMMUNITY): Payer: Self-pay | Admitting: General Surgery

## 2020-07-16 LAB — CBC
HCT: 24.6 % — ABNORMAL LOW (ref 36.0–46.0)
Hemoglobin: 8 g/dL — ABNORMAL LOW (ref 12.0–15.0)
MCH: 27.7 pg (ref 26.0–34.0)
MCHC: 32.5 g/dL (ref 30.0–36.0)
MCV: 85.1 fL (ref 80.0–100.0)
Platelets: 189 10*3/uL (ref 150–400)
RBC: 2.89 MIL/uL — ABNORMAL LOW (ref 3.87–5.11)
RDW: 15.2 % (ref 11.5–15.5)
WBC: 10.7 10*3/uL — ABNORMAL HIGH (ref 4.0–10.5)
nRBC: 0 % (ref 0.0–0.2)

## 2020-07-16 LAB — COMPREHENSIVE METABOLIC PANEL
ALT: 21 U/L (ref 0–44)
AST: 35 U/L (ref 15–41)
Albumin: 2.9 g/dL — ABNORMAL LOW (ref 3.5–5.0)
Alkaline Phosphatase: 94 U/L (ref 38–126)
Anion gap: 6 (ref 5–15)
BUN: 9 mg/dL (ref 8–23)
CO2: 24 mmol/L (ref 22–32)
Calcium: 8 mg/dL — ABNORMAL LOW (ref 8.9–10.3)
Chloride: 102 mmol/L (ref 98–111)
Creatinine, Ser: 0.84 mg/dL (ref 0.44–1.00)
GFR, Estimated: >60 mL/min (ref >60)
Glucose, Bld: 172 mg/dL — ABNORMAL HIGH (ref 70–99)
Potassium: 3.9 mmol/L (ref 3.5–5.1)
Sodium: 132 mmol/L — ABNORMAL LOW (ref 135–145)
Total Bilirubin: 0.5 mg/dL (ref 0.3–1.2)
Total Protein: 6 g/dL — ABNORMAL LOW (ref 6.5–8.1)

## 2020-07-16 LAB — PROTIME-INR
INR: 1.2 (ref 0.8–1.2)
Prothrombin Time: 15.5 seconds — ABNORMAL HIGH (ref 11.4–15.2)

## 2020-07-16 LAB — MAGNESIUM: Magnesium: 1.6 mg/dL — ABNORMAL LOW (ref 1.7–2.4)

## 2020-07-16 LAB — GLUCOSE, CAPILLARY
Glucose-Capillary: 142 mg/dL — ABNORMAL HIGH (ref 70–99)
Glucose-Capillary: 150 mg/dL — ABNORMAL HIGH (ref 70–99)
Glucose-Capillary: 151 mg/dL — ABNORMAL HIGH (ref 70–99)

## 2020-07-16 LAB — PHOSPHORUS: Phosphorus: 3.1 mg/dL (ref 2.5–4.6)

## 2020-07-16 MED ORDER — MAGNESIUM SULFATE 2 GM/50ML IV SOLN
2.0000 g | Freq: Once | INTRAVENOUS | Status: AC
  Administered 2020-07-16: 2 g via INTRAVENOUS
  Filled 2020-07-16: qty 50

## 2020-07-16 MED ORDER — CHLORHEXIDINE GLUCONATE CLOTH 2 % EX PADS: 6.0000 | MEDICATED_PAD | Freq: Every day | CUTANEOUS | Status: DC

## 2020-07-16 MED ORDER — ALBUMIN HUMAN 25 % IV SOLN
25.0000 g | Freq: Four times a day (QID) | INTRAVENOUS | Status: AC
  Administered 2020-07-16 – 2020-07-18 (×8): 25 g via INTRAVENOUS
  Filled 2020-07-16 (×8): qty 100

## 2020-07-16 MED ORDER — LACTATED RINGERS IV BOLUS
500.0000 mL | Freq: Once | INTRAVENOUS | Status: AC
  Administered 2020-07-16: 500 mL via INTRAVENOUS

## 2020-07-16 MED ORDER — CHLORHEXIDINE GLUCONATE 0.12 % MT SOLN
15.0000 mL | Freq: Two times a day (BID) | OROMUCOSAL | Status: DC
  Administered 2020-07-16 – 2020-07-19 (×7): 15 mL via OROMUCOSAL
  Filled 2020-07-16 (×5): qty 15

## 2020-07-16 MED ORDER — ORAL CARE MOUTH RINSE
15.0000 mL | Freq: Two times a day (BID) | OROMUCOSAL | Status: DC
  Administered 2020-07-16 – 2020-07-18 (×5): 15 mL via OROMUCOSAL

## 2020-07-16 MED ORDER — CHLORHEXIDINE GLUCONATE CLOTH 2 % EX PADS
6.0000 | MEDICATED_PAD | Freq: Every day | CUTANEOUS | Status: DC
  Administered 2020-07-16 – 2020-07-19 (×3): 6 via TOPICAL

## 2020-07-16 MED ORDER — VITAL HIGH PROTEIN PO LIQD: 1000.0000 mL | ORAL | Status: DC

## 2020-07-16 MED ORDER — ACETAMINOPHEN 10 MG/ML IV SOLN
1000.0000 mg | Freq: Once | INTRAVENOUS | Status: AC
  Administered 2020-07-17: 1000 mg via INTRAVENOUS

## 2020-07-16 MED ORDER — ACETAMINOPHEN 10 MG/ML IV SOLN
1000.0000 mg | Freq: Four times a day (QID) | INTRAVENOUS | Status: DC
  Filled 2020-07-16: qty 100

## 2020-07-16 MED ORDER — SODIUM CHLORIDE 0.9 % IV SOLN: INTRAVENOUS | Status: DC | PRN

## 2020-07-16 NOTE — Progress Notes (Signed)
1 Day Post-Op   Subjective/Chief Complaint: Denies pain.  Had two small episodes of emesis last night.  Also had an episode of hypotension that resolved with 500 mL bolus.     Objective: Vital signs in last 24 hours: Temp:  [98 F (36.7 C)-99.1 F (37.3 C)] 98 F (36.7 C) (04/20 0800) Pulse Rate:  [72-86] 80 (04/20 0715) Resp:  [10-45] 17 (04/20 0715) BP: (92-138)/(58-74) 92/59 (04/20 0600) SpO2:  [99 %-100 %] 100 % (04/20 0715) Arterial Line BP: (88-168)/(50-74) 117/50 (04/20 0715) Last BM Date: 07/15/20  Intake/Output from previous day: 04/19 0701 - 04/20 0700 In: 5802 [I.V.:3974.3; Blood:315; IV Piggyback:1340.2] Out: 2715 [Urine:2030; Emesis/NG output:30; Drains:155; Blood:500] Intake/Output this shift: No intake/output data recorded.  General appearance: alert, cooperative and no distress Resp: breathing comfortably Cardio: regular rate and rhythm GI: soft, approp tender at incision, drains serosang, dressing wtih some bloody drainage dried on it.  J tube intact Extremities: extremities normal, atraumatic, no cyanosis or edema  Lab Results:  Recent Labs    07/15/20 1702 07/16/20 0340  WBC 8.5 10.7*  HGB 9.4* 8.0*  HCT 29.4* 24.6*  PLT 202 189   BMET Recent Labs    07/15/20 1702 07/16/20 0340  NA 136 132*  K 3.9 3.9  CL 104 102  CO2 24 24  GLUCOSE 167* 172*  BUN 7* 9  CREATININE 0.84 0.84  CALCIUM 8.6* 8.0*   PT/INR Recent Labs    07/16/20 0340  LABPROT 15.5*  INR 1.2   ABG Recent Labs    07/15/20 1054 07/15/20 1257  PHART 7.426 7.435  HCO3 22.8 25.6    Studies/Results: DG Chest Port 1 View  Result Date: 07/15/2020 CLINICAL DATA:  Central line placement EXAM: PORTABLE CHEST 1 VIEW COMPARISON:  09/08/2019 FINDINGS: Interval placement of right jugular Port-A-Cath with tip in the mid SVC. No pneumothorax Interval placement of left jugular central venous catheter with the tip in the SVC. No pneumothorax NG tube in the stomach  . Cardiac and  mediastinal contours normal. Lungs clear without infiltrate or effusion. IMPRESSION: Satisfactory Port-A-Cath placement Satisfactory central line placement NG tube in the stomach. Electronically Signed   By: Franchot Gallo M.D.   On: 07/15/2020 15:59    Anti-infectives: Anti-infectives (From admission, onward)   Start     Dose/Rate Route Frequency Ordered Stop   07/15/20 2200  ceFAZolin (ANCEF) IVPB 2g/100 mL premix        2 g 200 mL/hr over 30 Minutes Intravenous Every 8 hours 07/15/20 1721 07/15/20 2212   07/15/20 0600  ceFAZolin (ANCEF) IVPB 2g/100 mL premix        2 g 200 mL/hr over 30 Minutes Intravenous On call to O.R. 07/15/20 0546 07/15/20 1210      Assessment/Plan: s/p Procedure(s): WHIPPLE PROCEDURE (N/A) DIAGNOSTIC LAPAROSCOPY (N/A) JEJUNOSTOMY (Left) adenocarcinoma of pancreatic head, uT2N1, s/p neoadjuvant chemotherapy - await surgical pathology   Brief hypotension - likely multifactorial from neurogenic (epidural), hypovolemia/ABL anemia.  Improved Hypomagnesemia - repleted Pre op hypokalemia - on IV fluids with K in them Moderate protein calorie malnutrition - Nutrition consult. Start trickle feeds tomorrow and hope to advance to goal. Will need oral supplements once on a diet.   Tentatively plan d/c NGT tomorrow and start sips of clears.   Anemia - multifactorial as well.  Chronic anemia from illness and chemotherapy, ABL anemia, dilutional anemia  Keep foley for urinary output monitoring, hope to d/c tomorrow OOB today, incentive spirometry PT/OT consult PPI for reflux  Hypertension - holding oral meds and on IV medication with hold orders.   Albumin boluses for hypoalbuminemia and as volume expander.  Pain control - Epidural planned for 4-5 days.  Appears to be working well with minimal PCA use.  If not using PCA much again today, will d/c PCA tomorrow and go to PRN narcotic dosing.  Getting 24 h IV tylenol as well.    ICU today given overnight hypotension.      LOS: 1 day    Stark Klein 07/16/2020

## 2020-07-16 NOTE — Anesthesia Post-op Follow-up Note (Signed)
  Anesthesia Pain Follow-up Note  Patient: Robin Daniels  Day #: 1  Date of Follow-up: 07/16/2020 Time: 7:03 AM  Last Vitals:  Vitals:   07/16/20 0500 07/16/20 0600  BP: 110/63 (!) 92/59  Pulse: 84 83  Resp: (!) 22 20  Temp:    SpO2: 100% 99%    Level of Consciousness: alert  Pain: mild   Side Effects:None  Catheter Site Exam:clean, dry, no drainage  Epidural / Intrathecal (From admission, onward)   Start     Dose/Rate Route Frequency Ordered Stop   07/15/20 1815  ropivacaine (PF) 2 mg/mL (0.2%) (NAROPIN) injection        6 mL/hr 6 mL/hr  Epidural Continuous 07/15/20 1721         Plan: Modify therapy to reduce side effects at surgeon's request  Called to reduce rate of epidural due to episode of hypotension. Rate decreased to 6cc/hr. Patient doing well.  Receiving fluid bolus at time of visit.  Catalina Gravel

## 2020-07-16 NOTE — Progress Notes (Signed)
Dr. Windle Guard with Trauma services paged for BP 77/51 MAP 61. Orders received to give 500 mL LR bolus. Anesthesia CRNA called to request decrease in epidural infusion. CRNA came to bedside and epidural reduced to 6 mL/hr

## 2020-07-17 LAB — COMPREHENSIVE METABOLIC PANEL
ALT: 15 U/L (ref 0–44)
AST: 24 U/L (ref 15–41)
Albumin: 3.5 g/dL (ref 3.5–5.0)
Alkaline Phosphatase: 79 U/L (ref 38–126)
Anion gap: 9 (ref 5–15)
BUN: 7 mg/dL — ABNORMAL LOW (ref 8–23)
CO2: 22 mmol/L (ref 22–32)
Calcium: 9 mg/dL (ref 8.9–10.3)
Chloride: 103 mmol/L (ref 98–111)
Creatinine, Ser: 0.77 mg/dL (ref 0.44–1.00)
GFR, Estimated: >60 mL/min (ref >60)
Glucose, Bld: 129 mg/dL — ABNORMAL HIGH (ref 70–99)
Potassium: 4 mmol/L (ref 3.5–5.1)
Sodium: 134 mmol/L — ABNORMAL LOW (ref 135–145)
Total Bilirubin: 0.8 mg/dL (ref 0.3–1.2)
Total Protein: 6.6 g/dL (ref 6.5–8.1)

## 2020-07-17 LAB — CBC
HCT: 24.3 % — ABNORMAL LOW (ref 36.0–46.0)
Hemoglobin: 7.8 g/dL — ABNORMAL LOW (ref 12.0–15.0)
MCH: 27.1 pg (ref 26.0–34.0)
MCHC: 32.1 g/dL (ref 30.0–36.0)
MCV: 84.4 fL (ref 80.0–100.0)
Platelets: 186 10*3/uL (ref 150–400)
RBC: 2.88 MIL/uL — ABNORMAL LOW (ref 3.87–5.11)
RDW: 15.4 % (ref 11.5–15.5)
WBC: 11.9 10*3/uL — ABNORMAL HIGH (ref 4.0–10.5)
nRBC: 0 % (ref 0.0–0.2)

## 2020-07-17 LAB — GLUCOSE, CAPILLARY
Glucose-Capillary: 133 mg/dL — ABNORMAL HIGH (ref 70–99)
Glucose-Capillary: 158 mg/dL — ABNORMAL HIGH (ref 70–99)
Glucose-Capillary: 170 mg/dL — ABNORMAL HIGH (ref 70–99)
Glucose-Capillary: 145 mg/dL — ABNORMAL HIGH (ref 70–99)
Glucose-Capillary: 162 mg/dL — ABNORMAL HIGH (ref 70–99)
Glucose-Capillary: 151 mg/dL — ABNORMAL HIGH (ref 70–99)

## 2020-07-17 LAB — PHOSPHORUS: Phosphorus: 1.6 mg/dL — ABNORMAL LOW (ref 2.5–4.6)

## 2020-07-17 LAB — PROTIME-INR
INR: 1.3 — ABNORMAL HIGH (ref 0.8–1.2)
Prothrombin Time: 15.9 seconds — ABNORMAL HIGH (ref 11.4–15.2)

## 2020-07-17 LAB — MAGNESIUM: Magnesium: 2 mg/dL (ref 1.7–2.4)

## 2020-07-17 LAB — SURGICAL PATHOLOGY

## 2020-07-17 MED ORDER — OSMOLITE 1.5 CAL PO LIQD
1000.0000 mL | ORAL | Status: DC
  Administered 2020-07-17: 1000 mL via JEJUNOSTOMY
  Filled 2020-07-17: qty 1000

## 2020-07-17 MED ORDER — PIPERACILLIN-TAZOBACTAM 3.375 G IVPB
3.3750 g | Freq: Three times a day (TID) | INTRAVENOUS | Status: DC
  Administered 2020-07-17 – 2020-07-22 (×16): 3.375 g via INTRAVENOUS
  Filled 2020-07-17 (×16): qty 50

## 2020-07-17 MED ORDER — METOPROLOL SUCCINATE ER 25 MG PO TB24
50.0000 mg | ORAL_TABLET | Freq: Every day | ORAL | Status: DC
  Administered 2020-07-17 – 2020-07-22 (×6): 50 mg via ORAL
  Filled 2020-07-17: qty 2
  Filled 2020-07-17 (×3): qty 1
  Filled 2020-07-17 (×2): qty 2

## 2020-07-17 MED ORDER — ACETAMINOPHEN 325 MG PO TABS
650.0000 mg | ORAL_TABLET | Freq: Four times a day (QID) | ORAL | Status: DC
  Administered 2020-07-17 – 2020-07-22 (×19): 650 mg via ORAL
  Filled 2020-07-17 (×21): qty 2

## 2020-07-17 NOTE — Progress Notes (Signed)
2 Days Post-Op   Subjective/Chief Complaint: Low grade fever, tachycardia and hypertension over night. Zosyn started and blood culture collected due to spillage of pus from the gallbladder intraoperatively. Restarting home dose of metoprolol this morning, holding amlodipine for now. She denies any pain. No nausea or vomiting, NG with low output and will therefore be removed today. Good urine output after some additional IV fluids yesterday. Will remove foley today. She got out of bed yesterday 3 times but remained in her room. Poor effort on incentive spirometer, therefore encouraged on morning rounds to continue working with that today to get off the supplemental oxygen.   Objective: Vital signs in last 24 hours: Temp:  [97.6 F (36.4 C)-101.6 F (38.7 C)] 98.6 F (37 C) (04/21 0800) Pulse Rate:  [79-126] 99 (04/21 0800) Resp:  [13-30] 24 (04/21 0800) BP: (126-168)/(58-89) 136/71 (04/21 0800) SpO2:  [90 %-99 %] 95 % (04/21 0800) Weight:  [86.7 kg] 86.7 kg (04/21 0218) Last BM Date: 07/15/20  Intake/Output from previous day: 04/20 0701 - 04/21 0700 In: 2836.2 [I.V.:2018.8; IV Piggyback:615.4] Out: 2940 [Urine:2625; Emesis/NG output:150; Drains:165] Intake/Output this shift: Total I/O In: 116 [I.V.:110; Other:6] Out: -   General appearance: alert and oriented, slightly fatigued, NAD GI: soft, nontender, incision c/d/i, drains with thin serosanguinous drainage, J-tube in place Extremities: no peripheral edema  Lab Results:  Recent Labs    07/16/20 0340 07/17/20 0250  WBC 10.7* 11.9*  HGB 8.0* 7.8*  HCT 24.6* 24.3*  PLT 189 186   BMET Recent Labs    07/16/20 0340 07/17/20 0250  NA 132* 134*  K 3.9 4.0  CL 102 103  CO2 24 22  GLUCOSE 172* 129*  BUN 9 7*  CREATININE 0.84 0.77  CALCIUM 8.0* 9.0   PT/INR Recent Labs    07/16/20 0340 07/17/20 0250  LABPROT 15.5* 15.9*  INR 1.2 1.3*   ABG Recent Labs    07/15/20 1054 07/15/20 1257  PHART 7.426 7.435   HCO3 22.8 25.6    Studies/Results: DG Chest Port 1 View  Result Date: 07/15/2020 CLINICAL DATA:  Central line placement EXAM: PORTABLE CHEST 1 VIEW COMPARISON:  09/08/2019 FINDINGS: Interval placement of right jugular Port-A-Cath with tip in the mid SVC. No pneumothorax Interval placement of left jugular central venous catheter with the tip in the SVC. No pneumothorax NG tube in the stomach  . Cardiac and mediastinal contours normal. Lungs clear without infiltrate or effusion. IMPRESSION: Satisfactory Port-A-Cath placement Satisfactory central line placement NG tube in the stomach. Electronically Signed   By: Franchot Gallo M.D.   On: 07/15/2020 15:59    Anti-infectives: Anti-infectives (From admission, onward)   Start     Dose/Rate Route Frequency Ordered Stop   07/17/20 0330  piperacillin-tazobactam (ZOSYN) IVPB 3.375 g        3.375 g 12.5 mL/hr over 240 Minutes Intravenous Every 8 hours 07/17/20 0223     07/15/20 2200  ceFAZolin (ANCEF) IVPB 2g/100 mL premix        2 g 200 mL/hr over 30 Minutes Intravenous Every 8 hours 07/15/20 1721 07/15/20 2212   07/15/20 0600  ceFAZolin (ANCEF) IVPB 2g/100 mL premix        2 g 200 mL/hr over 30 Minutes Intravenous On call to O.R. 07/15/20 0546 07/15/20 1210      Assessment/Plan: s/p Procedure(s): WHIPPLE PROCEDURE (N/A) DIAGNOSTIC LAPAROSCOPY (N/A) JEJUNOSTOMY (Left) on 4/19. Her postop course was complicated by low grade fever on 4/20 for which antibiotics were started. Awaiting  intraop cultures. Otherwise she is recovering well and progressing as expected/  Plan: - remove urinary catheter today - remove NG tube today, stick with sips and ice chips for now - start trickle tube feeds through J tube today - keep JP drains in place - continue epidural and PCA for pain control, added PO tylenol this am - restart home metoprolol today, hold amlodipine for now - continue incentive spirometry, goal is ambulating in the hallway today   LOS: 2  days   Caryn Bee 07/17/2020

## 2020-07-17 NOTE — Evaluation (Signed)
Physical Therapy Evaluation Patient Details Name: Robin Daniels MRN: [id number] DOB: 01/24/1956 Today's Date: 07/17/2020   History of Present Illness  The pt is a 65 yo female presenting 4/19 for whipple with placement of pancreatic duct stent and feeding tube due to pancreatic cancer. PMH includes: neoadjuvant chemotherapy for pancreatic cancer.  Clinical Impression  Pt in bed upon arrival of PT, agreeable to evaluation at this time. Prior to admission the pt was completely independent without need for AD, living with family. The pt now presents with minor limitations in functional mobility, strength, power, and activity tolerance due to above dx and resulting pain, and will continue to benefit from skilled PT to address these deficits. The pt was able to demo good initial mobility and short bout of ambulation in the room without use of AD or LOB, will benefit from continued skilled PT to progress functional endurance, strength, and dynamic stability to facilitate return home to prior level of functional independence and mobility.      Follow Up Recommendations No PT follow up;Supervision for mobility/OOB    Equipment Recommendations  None recommended by PT    Recommendations for Other Services       Precautions / Restrictions Precautions Precautions: Fall Precaution Comments: NG tube, PCA pump Restrictions Weight Bearing Restrictions: No      Mobility  Bed Mobility Overal bed mobility: Modified Independent             General bed mobility comments: pt with use of bed rails, but able to complete without assist, pt managing lines    Transfers Overall transfer level: Needs assistance Equipment used: None Transfers: Sit to/from Stand;Stand Pivot Transfers Sit to Stand: Supervision Stand pivot transfers: Supervision       General transfer comment: supervision for line management and safety  Ambulation/Gait Ambulation/Gait assistance: Supervision Gait Distance (Feet):  10 Feet (x2) Assistive device: None Gait Pattern/deviations: Step-through pattern;Decreased stride length Gait velocity: decreased   General Gait Details: small strides with slow but steady gait, limited by NG tube at this time, but pt able to complete multiple bouts without LOB or reports of change in pain      Balance Overall balance assessment: Mild deficits observed, not formally tested                                           Pertinent Vitals/Pain Pain Assessment: Faces Faces Pain Scale: Hurts a little bit Pain Location: abdomen Pain Descriptors / Indicators: Grimacing;Sore Pain Intervention(s): Monitored during session;Repositioned    Home Living Family/patient expects to be discharged to:: Private residence Living Arrangements: Children;Parent Available Help at Discharge: Family;Available 24 hours/day Type of Home: Apartment Home Access: Stairs to enter Entrance Stairs-Rails: Left Entrance Stairs-Number of Steps: 10 Home Layout: One level Home Equipment: Cane - quad;Grab bars - tub/shower      Prior Function Level of Independence: Independent         Comments: independent without use of AD, not currently working     Hand Dominance   Dominant Hand: Right    Extremity/Trunk Assessment   Upper Extremity Assessment Upper Extremity Assessment: Overall WFL for tasks assessed    Lower Extremity Assessment Lower Extremity Assessment: Overall WFL for tasks assessed    Cervical / Trunk Assessment Cervical / Trunk Assessment: Normal  Communication   Communication: No difficulties  Cognition Arousal/Alertness: Awake/alert Behavior During Therapy: WFL for  tasks assessed/performed Overall Cognitive Status: Within Functional Limits for tasks assessed                                        General Comments General comments (skin integrity, edema, etc.): VSS, NG tube on suction    Exercises     Assessment/Plan    PT  Assessment Patient needs continued PT services  PT Problem List Decreased strength;Decreased activity tolerance;Decreased mobility;Pain       PT Treatment Interventions DME instruction;Gait training;Stair training;Functional mobility training;Therapeutic activities;Therapeutic exercise;Balance training;Patient/family education    PT Goals (Current goals can be found in the Care Plan section)  Acute Rehab PT Goals Patient Stated Goal: return home PT Goal Formulation: With patient Time For Goal Achievement: 07/31/20 Potential to Achieve Goals: Good    Frequency Min 4X/week    AM-PAC PT "6 Clicks" Mobility  Outcome Measure Help needed turning from your back to your side while in a flat bed without using bedrails?: None Help needed moving from lying on your back to sitting on the side of a flat bed without using bedrails?: A Little Help needed moving to and from a bed to a chair (including a wheelchair)?: A Little Help needed standing up from a chair using your arms (e.g., wheelchair or bedside chair)?: A Little Help needed to walk in hospital room?: A Little Help needed climbing 3-5 steps with a railing? : A Little 6 Click Score: 19    End of Session   Activity Tolerance: Patient tolerated treatment well Patient left: in chair;with call bell/phone within reach Nurse Communication: Mobility status PT Visit Diagnosis: Other abnormalities of gait and mobility (R26.89)    Time: 2355-7322 PT Time Calculation (min) (ACUTE ONLY): 29 min   Charges:   PT Evaluation $PT Eval Low Complexity: 1 Low PT Treatments $Gait Training: 8-22 mins        Karma Ganja, PT, DPT   Acute Rehabilitation Department Pager #: [phone]  Otho Bellows 07/17/2020, 9:53 AM

## 2020-07-17 NOTE — Progress Notes (Signed)
2 Days Post-Op   Subjective/Chief Complaint: Had fever and tachycardia overnight.  Pt denies pain.  Pt denies n/v.  Was able to get OOB to chair yesterday.       Objective: Vital signs in last 24 hours: Temp:  [97.6 F (36.4 C)-101.6 F (38.7 C)] 101.6 F (38.7 C) (04/21 0000) Pulse Rate:  [79-126] 118 (04/21 0200) Resp:  [10-30] 19 (04/21 0200) BP: (92-168)/(58-89) 130/58 (04/21 0200) SpO2:  [90 %-100 %] 90 % (04/21 0200) Arterial Line BP: (88-142)/(50-68) 132/55 (04/20 0800) Last BM Date: 07/15/20  Intake/Output from previous day: 04/20 0701 - 04/21 0700 In: 2189.7 [I.V.:1629.1; IV Piggyback:399.6] Out: 845 [Urine:625; Emesis/NG output:150; Drains:70] Intake/Output this shift: Total I/O In: 547.5 [I.V.:466.4; Other:31.1; IV Piggyback:50] Out: -   General appearance: alert, cooperative and no distress Resp: breathing comfortably Cardio: regular rate and rhythm GI: soft, approp tender at incision, drains serosang, dressing wtih some bloody drainage dried on it.  J tube intact Extremities: extremities normal, atraumatic, no cyanosis or edema  Lab Results:  Recent Labs    07/15/20 1702 07/16/20 0340  WBC 8.5 10.7*  HGB 9.4* 8.0*  HCT 29.4* 24.6*  PLT 202 189   BMET Recent Labs    07/15/20 1702 07/16/20 0340  NA 136 132*  K 3.9 3.9  CL 104 102  CO2 24 24  GLUCOSE 167* 172*  BUN 7* 9  CREATININE 0.84 0.84  CALCIUM 8.6* 8.0*   PT/INR Recent Labs    07/16/20 0340  LABPROT 15.5*  INR 1.2   ABG Recent Labs    07/15/20 1054 07/15/20 1257  PHART 7.426 7.435  HCO3 22.8 25.6    Studies/Results: DG Chest Port 1 View  Result Date: 07/15/2020 CLINICAL DATA:  Central line placement EXAM: PORTABLE CHEST 1 VIEW COMPARISON:  09/08/2019 FINDINGS: Interval placement of right jugular Port-A-Cath with tip in the mid SVC. No pneumothorax Interval placement of left jugular central venous catheter with the tip in the SVC. No pneumothorax NG tube in the stomach  .  Cardiac and mediastinal contours normal. Lungs clear without infiltrate or effusion. IMPRESSION: Satisfactory Port-A-Cath placement Satisfactory central line placement NG tube in the stomach. Electronically Signed   By: Franchot Gallo M.D.   On: 07/15/2020 15:59    Anti-infectives: Anti-infectives (From admission, onward)   Start     Dose/Rate Route Frequency Ordered Stop   07/15/20 2200  ceFAZolin (ANCEF) IVPB 2g/100 mL premix        2 g 200 mL/hr over 30 Minutes Intravenous Every 8 hours 07/15/20 1721 07/15/20 2212   07/15/20 0600  ceFAZolin (ANCEF) IVPB 2g/100 mL premix        2 g 200 mL/hr over 30 Minutes Intravenous On call to O.R. 07/15/20 0546 07/15/20 1210      Assessment/Plan: s/p Procedure(s): WHIPPLE PROCEDURE (N/A) DIAGNOSTIC LAPAROSCOPY (N/A) JEJUNOSTOMY (Left) adenocarcinoma of pancreatic head, uT2N1, s/p neoadjuvant chemotherapy - await surgical pathology   Brief hypotension - improved, continue albumin boluses Hypomagnesemia - repleted, await recheck today. Pre op hypokalemia - on IV fluids with K in them Moderate protein calorie malnutrition - Nutrition consult pending. Start trickle feeds todayl. Will need oral supplements once on a diet.   NGT still with a fair amount in canister not yet measured.  Will allow sips of clears today and plan d/c ngt tomorrow.   Anemia - multifactorial as well.  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   Albumin boluses for hypoalbuminemia and as volume expander.  Pain control - Epidural planned for 4-5 days total.  Is using more PCA since getting OOB.   ICU today for fever/tachycardia Start antibiotics for frank spillage of pus from gallbladder.  Culture pending, but gram stain shows polymicrobial growth.  Abundant GPCs. -  start zosyn   LOS: 2 days   Milus Height, MD FACS Surgical Oncology, General Surgery, Trauma and [place] [facility], [place] for weekday/non holidays Check [email or web address] for coverage night/weekend/holidays  Do not use SecureChat as it is not reliable for timely patient care.

## 2020-07-17 NOTE — Progress Notes (Signed)
Initial Nutrition Assessment  DOCUMENTATION CODES:   Not applicable  INTERVENTION:   Initiate tube feeding via J-tube: Osmolite 1.5 at 20 ml/h (480 ml per day)   As able recommend advance TF to goal:  Osmolite 1.5 @ 55 ml/hr  Prosource TF 45 ml BID  Provides 2060 kcal, 104 gm protein, 1003 ml free water daily    NUTRITION DIAGNOSIS:   Increased nutrient needs related to cancer and cancer related treatments as evidenced by estimated needs.  GOAL:   Patient will meet greater than or equal to 90% of their needs  MONITOR:   TF tolerance,Diet advancement  REASON FOR ASSESSMENT:   Consult Enteral/tube feeding initiation and management  ASSESSMENT:   Pt with PMH of anemia, HLD, HTN, anxiety, and adenocarcinoma of pancreatic head, T2N1 s/p neoadjuvant chemotherapy (s/p 10 cycles of folfirinox with some mucositis and loss of taste from chemo) admitted for surgery s/p classic pancreaticoduodenectomy, placement of pancreatic duct stent, and jejunostomy placement; culture of gallbladder pus of microbiology.   Pt discussed during ICU rounds and with RN.  Per chart review weight has remained stable x 5 months   4/21 trickle TF initiated via J-tube; NG removed  Medications reviewed and include: D51/2NS with 40 mEq/L @ 100 ml/hr  Labs reviewed: Na 134, PO4: 1.6 CBG's: 133-158   UOP: 2625 ml 16 F NG: 150 ml output x 24 hours Drain 1: 95 ml Drain 2: 70 ml  I&O: +3 L 16 F Jejunostomy: clamped currently    Diet Order:   Diet Order            Diet NPO time specified Except for: Ice Chips, Sips with Meds  Diet effective now                 EDUCATION NEEDS:   No education needs have been identified at this time  Skin:  Skin Assessment: Reviewed RN Assessment  Last BM:  4/19  Height:   Ht Readings from Last 1 Encounters:  07/15/20 5\' 3"  (1.6 m)    Weight:   Wt Readings from Last 1 Encounters:  07/17/20 86.7 kg    Ideal Body Weight:  52.2 kg  BMI:   Body mass index is 33.86 kg/m.  Estimated Nutritional Needs:   Kcal:  2000-2200  Protein:  100-115 grams  Fluid:  >2 L/day  Lockie Pares., RD, LDN, CNSC See AMiON for contact information

## 2020-07-17 NOTE — Anesthesia Post-op Follow-up Note (Signed)
  Anesthesia Pain Follow-up Note  Patient: Robin Daniels  Day #: 2  Date of Follow-up: 07/17/2020 Time: 1:06 PM  Last Vitals:  Vitals:   07/17/20 1137 07/17/20 1200  BP:  (!) 151/77  Pulse:  88  Resp:  (!) 26  Temp: 37.1 C   SpO2:  98%    Level of Consciousness: alert  Pain: mild   Side Effects:None  Catheter Site Exam:clean, dry, no drainage  Epidural / Intrathecal (From admission, onward)   Start     Dose/Rate Route Frequency Ordered Stop   07/15/20 1815  ropivacaine (PF) 2 mg/mL (0.2%) (NAROPIN) injection        6 mL/hr 6 mL/hr  Epidural Continuous 07/15/20 1721         Plan: Continue current therapy of postop epidural at surgeon's request  Lamija Besse,W. EDMOND

## 2020-07-18 LAB — CBC
HCT: 23.1 % — ABNORMAL LOW (ref 36.0–46.0)
Hemoglobin: 7.4 g/dL — ABNORMAL LOW (ref 12.0–15.0)
MCH: 27.3 pg (ref 26.0–34.0)
MCHC: 32 g/dL (ref 30.0–36.0)
MCV: 85.2 fL (ref 80.0–100.0)
Platelets: 167 10*3/uL (ref 150–400)
RBC: 2.71 MIL/uL — ABNORMAL LOW (ref 3.87–5.11)
RDW: 15.3 % (ref 11.5–15.5)
WBC: 9.6 10*3/uL (ref 4.0–10.5)
nRBC: 0 % (ref 0.0–0.2)

## 2020-07-18 LAB — PROTIME-INR
INR: 1.2 (ref 0.8–1.2)
Prothrombin Time: 15.1 seconds (ref 11.4–15.2)

## 2020-07-18 LAB — GLUCOSE, CAPILLARY
Glucose-Capillary: 148 mg/dL — ABNORMAL HIGH (ref 70–99)
Glucose-Capillary: 149 mg/dL — ABNORMAL HIGH (ref 70–99)
Glucose-Capillary: 169 mg/dL — ABNORMAL HIGH (ref 70–99)
Glucose-Capillary: 152 mg/dL — ABNORMAL HIGH (ref 70–99)
Glucose-Capillary: 120 mg/dL — ABNORMAL HIGH (ref 70–99)
Glucose-Capillary: 154 mg/dL — ABNORMAL HIGH (ref 70–99)

## 2020-07-18 LAB — COMPREHENSIVE METABOLIC PANEL
ALT: 12 U/L (ref 0–44)
AST: 17 U/L (ref 15–41)
Albumin: 4 g/dL (ref 3.5–5.0)
Alkaline Phosphatase: 67 U/L (ref 38–126)
Anion gap: 8 (ref 5–15)
BUN: 5 mg/dL — ABNORMAL LOW (ref 8–23)
CO2: 24 mmol/L (ref 22–32)
Calcium: 9.2 mg/dL (ref 8.9–10.3)
Chloride: 100 mmol/L (ref 98–111)
Creatinine, Ser: 0.79 mg/dL (ref 0.44–1.00)
GFR, Estimated: >60 mL/min (ref >60)
Glucose, Bld: 131 mg/dL — ABNORMAL HIGH (ref 70–99)
Potassium: 3.5 mmol/L (ref 3.5–5.1)
Sodium: 132 mmol/L — ABNORMAL LOW (ref 135–145)
Total Bilirubin: 1.1 mg/dL (ref 0.3–1.2)
Total Protein: 6.9 g/dL (ref 6.5–8.1)

## 2020-07-18 LAB — PHOSPHORUS: Phosphorus: 1.7 mg/dL — ABNORMAL LOW (ref 2.5–4.6)

## 2020-07-18 LAB — MAGNESIUM: Magnesium: 1.9 mg/dL (ref 1.7–2.4)

## 2020-07-18 MED ORDER — POTASSIUM PHOSPHATES 15 MMOLE/5ML IV SOLN
30.0000 mmol | Freq: Once | INTRAVENOUS | Status: AC
  Administered 2020-07-18: 30 mmol via INTRAVENOUS
  Filled 2020-07-18: qty 10

## 2020-07-18 MED ORDER — OSMOLITE 1.5 CAL PO LIQD: 1000.0000 mL | ORAL | Status: DC

## 2020-07-18 MED ORDER — OSMOLITE 1.5 CAL PO LIQD
1000.0000 mL | ORAL | Status: DC
  Administered 2020-07-18: 1000 mL via JEJUNOSTOMY

## 2020-07-18 MED ORDER — HEPARIN SODIUM (PORCINE) 5000 UNIT/ML IJ SOLN
5000.0000 [IU] | Freq: Three times a day (TID) | INTRAMUSCULAR | Status: DC
  Administered 2020-07-18 – 2020-07-20 (×6): 5000 [IU] via SUBCUTANEOUS
  Filled 2020-07-18 (×6): qty 1

## 2020-07-18 MED ORDER — OXYCODONE HCL 5 MG PO TABS
5.0000 mg | ORAL_TABLET | ORAL | Status: DC | PRN
  Administered 2020-07-19 – 2020-07-20 (×3): 5 mg via ORAL
  Filled 2020-07-18 (×3): qty 1

## 2020-07-18 MED ORDER — AMLODIPINE BESYLATE 10 MG PO TABS
10.0000 mg | ORAL_TABLET | Freq: Every day | ORAL | Status: DC
  Administered 2020-07-18 – 2020-07-22 (×5): 10 mg via ORAL
  Filled 2020-07-18: qty 1
  Filled 2020-07-18: qty 2
  Filled 2020-07-18 (×4): qty 1

## 2020-07-18 MED ORDER — SENNOSIDES-DOCUSATE SODIUM 8.6-50 MG PO TABS
1.0000 | ORAL_TABLET | Freq: Two times a day (BID) | ORAL | Status: DC
  Administered 2020-07-18: 1 via ORAL
  Filled 2020-07-18: qty 1

## 2020-07-18 MED ORDER — PANTOPRAZOLE SODIUM 40 MG PO TBEC
40.0000 mg | DELAYED_RELEASE_TABLET | Freq: Every day | ORAL | Status: DC
  Administered 2020-07-18 – 2020-07-21 (×4): 40 mg via ORAL
  Filled 2020-07-18 (×4): qty 1

## 2020-07-18 MED ORDER — MAGNESIUM SULFATE 2 GM/50ML IV SOLN
2.0000 g | Freq: Once | INTRAVENOUS | Status: AC
  Administered 2020-07-18: 2 g via INTRAVENOUS
  Filled 2020-07-18: qty 50

## 2020-07-18 MED ORDER — HYDROMORPHONE HCL 1 MG/ML IJ SOLN: 0.5000 mg | INTRAMUSCULAR | Status: DC | PRN

## 2020-07-18 NOTE — Progress Notes (Signed)
Physical Therapy Treatment Patient Details Name: Robin Daniels MRN: [id number] DOB: 04/30/1955 Today's Date: 07/18/2020    History of Present Illness The pt is a 65 yo female presenting 4/19 for whipple with placement of pancreatic duct stent and feeding tube due to pancreatic cancer. PMH includes: neoadjuvant chemotherapy for pancreatic cancer.    PT Comments    The pt continues to demo great progress with therapy this morning. She was able to increase hallway ambulation to 150 ft with navigation of x4 stairs in preparation for management of 10 steps to enter her home. The pt reports no change in pain with mobility, was able to complete with good stability with single UE support. Will continue to benefit from skilled PT to further progress mobility, facilitate return home with good independence once medically cleared.     Follow Up Recommendations  No PT follow up;Supervision for mobility/OOB     Equipment Recommendations  None recommended by PT    Recommendations for Other Services       Precautions / Restrictions Precautions Precautions: Fall Precaution Comments: PCA pump, 2 drains R abdomen Restrictions Weight Bearing Restrictions: No    Mobility  Bed Mobility Overal bed mobility: Modified Independent             General bed mobility comments: pt OOB in recliner at start and end of session    Transfers Overall transfer level: Needs assistance Equipment used: None Transfers: Sit to/from Stand Sit to Stand: Supervision         General transfer comment: supervision, pt reaching for single UE support or IV pole, but able to static stand without UE support  Ambulation/Gait Ambulation/Gait assistance: Supervision Gait Distance (Feet): 150 Feet Assistive device: IV Pole;None Gait Pattern/deviations: Step-through pattern;Decreased stride length Gait velocity: 0.4 m/s Gait velocity interpretation: <1.8 ft/sec, indicate of risk for recurrent falls General Gait  Details: short strides, but slow and steady gait   Stairs Stairs: Yes Stairs assistance: Min guard Stair Management: One rail Left;Step to pattern;Forwards Number of Stairs: 4 General stair comments: step-to pattern with L rail, no instability, no change in pain       Balance Overall balance assessment: Mild deficits observed, not formally tested                                          Cognition Arousal/Alertness: Awake/alert Behavior During Therapy: WFL for tasks assessed/performed Overall Cognitive Status: Within Functional Limits for tasks assessed                                        Exercises      General Comments General comments (skin integrity, edema, etc.): VSS      Pertinent Vitals/Pain Pain Assessment: Faces Faces Pain Scale: Hurts a little bit Pain Location: abdomen Pain Descriptors / Indicators: Grimacing;Sore Pain Intervention(s): Limited activity within patient's tolerance;Monitored during session;Repositioned           PT Goals (current goals can now be found in the care plan section) Acute Rehab PT Goals Patient Stated Goal: return home PT Goal Formulation: With patient Time For Goal Achievement: 07/31/20 Potential to Achieve Goals: Good Progress towards PT goals: Progressing toward goals    Frequency    Min 4X/week      PT Plan Current plan remains  appropriate       AM-PAC PT "6 Clicks" Mobility   Outcome Measure  Help needed turning from your back to your side while in a flat bed without using bedrails?: None Help needed moving from lying on your back to sitting on the side of a flat bed without using bedrails?: A Little Help needed moving to and from a bed to a chair (including a wheelchair)?: A Little Help needed standing up from a chair using your arms (e.g., wheelchair or bedside chair)?: A Little Help needed to walk in hospital room?: A Little Help needed climbing 3-5 steps with a  railing? : A Little 6 Click Score: 19    End of Session Equipment Utilized During Treatment: Gait belt Activity Tolerance: Patient tolerated treatment well;No increased pain Patient left: in chair;with call bell/phone within reach;with nursing/sitter in room Nurse Communication: Mobility status PT Visit Diagnosis: Other abnormalities of gait and mobility (R26.89)     Time: 3159-4585 PT Time Calculation (min) (ACUTE ONLY): 20 min  Charges:  $Gait Training: 8-22 mins                     Karma Ganja, PT, DPT   Acute Rehabilitation Department Pager #: [phone]   Otho Bellows 07/18/2020, 8:35 AM

## 2020-07-18 NOTE — Evaluation (Signed)
Occupational Therapy Evaluation and Discharge Patient Details Name: Robin Daniels MRN: [id number] DOB: 12-Nov-1955 Today's Date: 07/18/2020    History of Present Illness The pt is a 65 yo female presenting 4/19 for whipple with placement of pancreatic duct stent and feeding tube due to pancreatic cancer. PMH includes: neoadjuvant chemotherapy for pancreatic cancer.   Clinical Impression   This 65 yo female admitted and underwent above presents to acute OT with all education completed with pt at an overall S level (and she will have this at home). No further OT needs, we will D/C from acute OT.    Follow Up Recommendations  No OT follow up;Supervision - Intermittent    Equipment Recommendations  Tub/shower seat;Other (comment) (Medicaid secondary)       Precautions / Restrictions Precautions Precautions: Fall Precaution Comments: PCA pump, 2 drains R abdomen Restrictions Weight Bearing Restrictions: No      Mobility Bed Mobility   General bed mobility comments: pt OOB in recliner at start and end of session    Transfers Overall transfer level: Needs assistance Equipment used: None Transfers: Sit to/from Stand Sit to Stand: Supervision            Balance Overall balance assessment: Mild deficits observed, not formally tested                                         ADL either performed or assessed with clinical judgement   ADL                                         General ADL Comments: Overall at a S level; tub seat recommended with pt instructed to ask surgeon when she can get a shower.     Vision Patient Visual Report: No change from baseline              Pertinent Vitals/Pain Pain Assessment: No/denies pain      Hand Dominance Right   Extremity/Trunk Assessment Upper Extremity Assessment Upper Extremity Assessment: Overall WFL for tasks assessed           Communication Communication Communication: No  difficulties   Cognition Arousal/Alertness: Awake/alert Behavior During Therapy: WFL for tasks assessed/performed Overall Cognitive Status: Within Functional Limits for tasks assessed                                     General Comments  VSS            Home Living Family/patient expects to be discharged to:: Private residence Living Arrangements: Children;Parent;Other relatives (sisters) Available Help at Discharge: Family;Available 24 hours/day Type of Home: Apartment Home Access: Stairs to enter Entrance Stairs-Number of Steps: 10 Entrance Stairs-Rails: Left Home Layout: One level     Bathroom Shower/Tub: Tub/shower unit;Curtain   Bathroom Toilet: Handicapped height     Home Equipment: Cane - quad;Grab bars - tub/shower          Prior Functioning/Environment Level of Independence: Independent        Comments: independent without use of AD, 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)        OT Problem List: Impaired balance (sitting and/or standing) (feel this will get better then more she  is up and about with PT and floor staff.)         OT Goals(Current goals can be found in the care plan section) Acute Rehab OT Goals Patient Stated Goal: return home  OT Frequency:                AM-PAC OT "6 Clicks" Daily Activity     Outcome Measure Help from another person eating meals?: None Help from another person taking care of personal grooming?: A Little (S) Help from another person toileting, which includes using toliet, bedpan, or urinal?: A Little (S) Help from another person bathing (including washing, rinsing, drying)?: A Little (S) Help from another person to put on and taking off regular upper body clothing?: A Little (S) Help from another person to put on and taking off regular lower body clothing?: A Little (S) 6 Click Score: 19   End of Session Nurse Communication:  (pt brushed her teeth and washed her face)  Activity  Tolerance: Patient tolerated treatment well Patient left: in chair;with call bell/phone within reach  OT Visit Diagnosis: Unsteadiness on feet (R26.81);Muscle weakness (generalized) (M62.81)                Time: 4403-4742 OT Time Calculation (min): 28 min Charges:  OT General Charges $OT Visit: 1 Visit OT Evaluation $OT Eval Moderate Complexity: 1 Mod OT Treatments $Self Care/Home Management : 8-22 mins  [address]en Circle, OTR/L Acute [employer] Pager [phone] Office [phone]     Almon Register 07/18/2020, 9:31 AM

## 2020-07-18 NOTE — Progress Notes (Signed)
3 Days Post-Op   Subjective/Chief Complaint: Again low grade fever, tachycardia and hypertension over night. She remains on Zosyn. Restarting home amlodipine today. She is otherwise doing very well. No nausea, minimal pain. Labs within expected limits. She was able to ambulated within her room yesterday, goal is to get in the hallway today. Drains remain serous with largely unchanged volume.    Objective: Vital signs in last 24 hours: Temp:  [98.7 F (37.1 C)-102.1 F (38.9 C)] 98.7 F (37.1 C) (04/22 0800) Pulse Rate:  [85-110] 87 (04/22 0700) Resp:  [20-36] 26 (04/22 0700) BP: (141-172)/(74-95) 163/80 (04/22 0700) SpO2:  [94 %-98 %] 95 % (04/22 0600) Weight:  [85.9 kg] 85.9 kg (04/22 0400) Last BM Date: 07/18/20  Intake/Output from previous day: 04/21 0701 - 04/22 0700 In: 2199.9 [I.V.:1255.9; NG/GT:280; IV Piggyback:538.4] Out: 1610 [Urine:1125; Drains:295] Intake/Output this shift: No intake/output data recorded.  General appearance: alert and oriented, NAD GI: soft, nontender, incision c/d/i, drains with thin serosanguinous drainage, J-tube in place Extremities: no peripheral edema  Lab Results:  Recent Labs    07/17/20 0250 07/18/20 0348  WBC 11.9* 9.6  HGB 7.8* 7.4*  HCT 24.3* 23.1*  PLT 186 167   BMET Recent Labs    07/17/20 0250 07/18/20 0348  NA 134* 132*  K 4.0 3.5  CL 103 100  CO2 22 24  GLUCOSE 129* 131*  BUN 7* 5*  CREATININE 0.77 0.79  CALCIUM 9.0 9.2   PT/INR Recent Labs    07/17/20 0250 07/18/20 0348  LABPROT 15.9* 15.1  INR 1.3* 1.2   ABG Recent Labs    07/15/20 1054 07/15/20 1257  PHART 7.426 7.435  HCO3 22.8 25.6    Studies/Results: No results found.  Anti-infectives: Anti-infectives (From admission, onward)   Start     Dose/Rate Route Frequency Ordered Stop   07/17/20 0330  piperacillin-tazobactam (ZOSYN) IVPB 3.375 g        3.375 g 12.5 mL/hr over 240 Minutes Intravenous Every 8 hours 07/17/20 0223     07/15/20  2200  ceFAZolin (ANCEF) IVPB 2g/100 mL premix        2 g 200 mL/hr over 30 Minutes Intravenous Every 8 hours 07/15/20 1721 07/15/20 2212   07/15/20 0600  ceFAZolin (ANCEF) IVPB 2g/100 mL premix        2 g 200 mL/hr over 30 Minutes Intravenous On call to O.R. 07/15/20 0546 07/15/20 1210      Assessment/Plan: s/p Procedure(s): WHIPPLE PROCEDURE (N/A) DIAGNOSTIC LAPAROSCOPY (N/A) JEJUNOSTOMY (Left) on 4/19 (POD3). Her postop course has been complicated by intermittent fevers for which she is on Zosyn. Otherwise she is progressing very well. Had return of bowel function today and is starting to eat in addition to tube feeds.   Plan: - start clear liquid diet - increase tube feeds to 35ml/hr this morning, goal is 32ml - discontinue maintenance IV fluids - discontinue PCA, start PRN Oxycodone and Dilaudid - restart home amlodipine today - start DVT prophylaxis with SQH - keep JP drains in place - continue incentive spirometry, goal is ambulating in the hallway today   LOS: 3 days   Caryn Bee 07/18/2020

## 2020-07-18 NOTE — Anesthesia Post-op Follow-up Note (Signed)
  Anesthesia Pain Follow-up Note  Patient: Robin Daniels  Day #: 3  Date of Follow-up: 07/18/2020 Time: 11:03 AM  Last Vitals:  Vitals:   07/18/20 0900 07/18/20 1000  BP: 125/73 136/69  Pulse: 84 77  Resp: (!) 22 (!) 26  Temp:    SpO2: 99% 97%    Level of Consciousness: alert  Pain: mild   Side Effects:None  Catheter Site Exam:clean  Anti-Coag Meds (From admission, onward)   Start     Dose/Rate Route Frequency Ordered Stop   07/18/20 1400  heparin injection 5,000 Units        5,000 Units Subcutaneous Every 8 hours 07/18/20 0711      Epidural / Intrathecal (From admission, onward)   Start     Dose/Rate Route Frequency Ordered Stop   07/15/20 1815  ropivacaine (PF) 2 mg/mL (0.2%) (NAROPIN) injection        6 mL/hr 6 mL/hr  Epidural Continuous 07/15/20 1721         Plan: Continue current therapy of postop epidural at surgeon's request. Site c/d/i. Patient mobility and diet not limited. Will maintain rate at 6cc/hr. No additional hypotensive events post dose reduction 07/16/20.   [place]

## 2020-07-19 LAB — GLUCOSE, CAPILLARY
Glucose-Capillary: 164 mg/dL — ABNORMAL HIGH (ref 70–99)
Glucose-Capillary: 160 mg/dL — ABNORMAL HIGH (ref 70–99)
Glucose-Capillary: 169 mg/dL — ABNORMAL HIGH (ref 70–99)
Glucose-Capillary: 159 mg/dL — ABNORMAL HIGH (ref 70–99)

## 2020-07-19 LAB — BPAM RBC
Blood Product Expiration Date: 202205052359
Blood Product Expiration Date: 202205072359
Blood Product Expiration Date: 202205082359
Blood Product Expiration Date: 202205082359
Blood Product Expiration Date: 202205102359
Blood Product Expiration Date: 202205112359
Blood Product Expiration Date: 202205112359
Blood Product Expiration Date: 202205112359
ISSUE DATE / TIME: 202204181450
ISSUE DATE / TIME: 202204190742
ISSUE DATE / TIME: 202204200555
ISSUE DATE / TIME: 202204200906
Unit Type and Rh: 7300
Unit Type and Rh: 7300
Unit Type and Rh: 7300
Unit Type and Rh: 7300
Unit Type and Rh: 7300
Unit Type and Rh: 7300
Unit Type and Rh: 7300
Unit Type and Rh: 7300

## 2020-07-19 LAB — TYPE AND SCREEN
ABO/RH(D): B POS
Antibody Screen: NEGATIVE
Unit division: 0
Unit division: 0
Unit division: 0
Unit division: 0
Unit division: 0
Unit division: 0
Unit division: 0
Unit division: 0

## 2020-07-19 MED ORDER — OSMOLITE 1.5 CAL PO LIQD
1000.0000 mL | ORAL | Status: DC
  Administered 2020-07-19: 1000 mL via JEJUNOSTOMY

## 2020-07-19 MED ORDER — METHOCARBAMOL 500 MG PO TABS
500.0000 mg | ORAL_TABLET | Freq: Three times a day (TID) | ORAL | Status: DC | PRN
  Administered 2020-07-20: 500 mg via ORAL
  Filled 2020-07-19: qty 1

## 2020-07-19 MED ORDER — ENSURE ENLIVE PO LIQD
237.0000 mL | Freq: Two times a day (BID) | ORAL | Status: DC
  Administered 2020-07-19 – 2020-07-22 (×5): 237 mL via ORAL

## 2020-07-19 NOTE — Progress Notes (Signed)
Anesthesiology Note:  Epidural catheter removed. Site OK, tip intact, sterile dressing applied.  Roberts Gaudy

## 2020-07-19 NOTE — Addendum Note (Signed)
Addendum  created 07/19/20 1040 by Roberts Gaudy, MD   Clinical Note Signed

## 2020-07-19 NOTE — TOC Initial Note (Signed)
Transition of Care [facility]) - Initial/Assessment Note    Patient Details  Name: Robin Daniels MRN: [id number] Date of Birth: October 05, 1955  Transition of [facility]) CM/SW Contact:    Bartholomew Crews, RN Phone Number: [phone] 07/19/2020, 3:49 PM  Clinical Narrative:                  Notified by PT of patient DME needs - 3N1 and tub transfer bench. Referral to [employer] for delivery to room. Spoke with patient at the bedside to confirm needed DME. TOC following for transition needs.   Expected Discharge Plan: Home/Self Care Barriers to Discharge: Continued Medical Work up   Patient Goals and CMS Choice Patient states their goals for this hospitalization and ongoing recovery are:: return home CMS Medicare.gov Compare Post Acute Care list provided to:: Patient Choice offered to / list presented to : Patient  Expected Discharge Plan and Services Expected Discharge Plan: Home/Self Care   Discharge Planning Services: CM Consult Post Acute Care Choice: Durable Medical Equipment Living arrangements for the past 2 months: Apartment                 DME Arranged: 3-N-1,Tub bench DME Agency: [employer] Date DME Agency Contacted: 07/19/20 Time DME Agency Contacted: [phone] Representative spoke with at DME Agency: [place] Arranged: NA [place] Agency: NA        Prior Living Arrangements/Services Living arrangements for the past 2 months: [place] with:: Self Patient language and need for interpreter reviewed:: Yes              Criminal Activity/Legal Involvement Pertinent to Current Situation/Hospitalization: No - Comment as needed  Activities of Daily Living Home Assistive Devices/Equipment: Eyeglasses ADL Screening (condition at time of admission) Patient's cognitive ability adequate to safely complete daily activities?: Yes Is the patient deaf or have difficulty hearing?: No Does the patient have difficulty seeing, even when wearing glasses/contacts?: No Does the patient  have difficulty concentrating, remembering, or making decisions?: No Patient able to express need for assistance with ADLs?: Yes Does the patient have difficulty dressing or bathing?: No Independently performs ADLs?: Yes (appropriate for developmental age) Does the patient have difficulty walking or climbing stairs?: No Weakness of Legs: Both Weakness of Arms/Hands: Both  Permission Sought/Granted                  Emotional Assessment   Attitude/Demeanor/Rapport: Engaged Affect (typically observed): Accepting Orientation: : Oriented to Self,Oriented to  Time,Oriented to Place,Oriented to Situation Alcohol / Substance Use: Not Applicable Psych Involvement: No (comment)  Admission diagnosis:  Pancreatic cancer ([place]) [C25.9] 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 Pancreatic cancer ([place]) 07/15/2020  . 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 Anemia due to antineoplastic chemotherapy 04/01/2020  . Cancer of head of pancreas ([place]) 12/04/2019  . Hypokalemia 11/14/2019  . Elevated liver enzymes 11/14/2019  . Bilateral primary osteoarthritis of hip 09/12/2019  . Obesity 12/07/2015  . Heart palpitations 02/18/2015  . Hypertension goal BP (blood pressure) < 140/90 10/17/2014  . Pure hypercholesterolemia 10/17/2014  . H/O total knee replacement 10/17/2014  . Anxiety 09/11/2014  . HLD (hyperlipidemia) 09/11/2014  . Migraine without aura and responsive to treatment 09/11/2014  . Nocturnal cough 09/11/2014  . Anemia, iron deficiency 09/11/2014  . Plantar fasciitis 09/11/2014  . IFG (impaired fasting glucose) 09/11/2014  . Avitaminosis D 09/11/2014  . Absence of interventricular septum 09/11/2014  PCP:  [place]:   [facility] [address] ([address]), [place] [address] [place]) [place] [address] Phone: [phone] Fax:  [phone]     Social Determinants of Health (SDOH) Interventions    Readmission Risk Interventions No flowsheet data found.

## 2020-07-19 NOTE — Progress Notes (Signed)
Physical Therapy Treatment Patient Details Name: Robin Daniels MRN: [id number] DOB: 06-Mar-1956 Today's Date: 07/19/2020    History of Present Illness The pt is a 65 yo female presenting 4/19 for whipple with placement of pancreatic duct stent and feeding tube due to pancreatic cancer. PMH includes: neoadjuvant chemotherapy for pancreatic cancer.    PT Comments    Pt making steady progress towards her physical therapy goals, demonstrating improved activity tolerance and increased ambulation distance. Ambulating x 250 feet with no assistive device at a supervision level. Pt does intermittently reach for external support, but declines use of single point cane. Pt reports she will have supervision for stair negotiation and mobility upon discharge.     Follow Up Recommendations  No PT follow up;Supervision for mobility/OOB     Equipment Recommendations  3in1 (PT)    Recommendations for Other Services       Precautions / Restrictions Precautions Precautions: Fall;Other (comment) Precaution Comments: G tube, 2 drains R abdomen Restrictions Weight Bearing Restrictions: No    Mobility  Bed Mobility               General bed mobility comments: OOB in recliner    Transfers Overall transfer level: Needs assistance Equipment used: None Transfers: Sit to/from Stand Sit to Stand: Supervision            Ambulation/Gait Ambulation/Gait assistance: Supervision Gait Distance (Feet): 250 Feet Assistive device: None Gait Pattern/deviations: Step-through pattern;Decreased stride length Gait velocity: decreased Gait velocity interpretation: <1.31 ft/sec, indicative of household ambulator General Gait Details: Pt reaching out for intermittent external support, slow speed, no overt LOB. supervision for safety   Stairs             Wheelchair Mobility    Modified Rankin (Stroke Patients Only)       Balance Overall balance assessment: Mild deficits observed, not  formally tested                                          Cognition Arousal/Alertness: Awake/alert Behavior During Therapy: WFL for tasks assessed/performed Overall Cognitive Status: Within Functional Limits for tasks assessed                                        Exercises      General Comments        Pertinent Vitals/Pain Pain Assessment: Faces Faces Pain Scale: Hurts a little bit Pain Location: abdomen Pain Descriptors / Indicators: Grimacing;Sore Pain Intervention(s): Monitored during session    Home Living                      Prior Function            PT Goals (current goals can now be found in the care plan section) Acute Rehab PT Goals Patient Stated Goal: return home Potential to Achieve Goals: Good Progress towards PT goals: Progressing toward goals    Frequency    Min 4X/week      PT Plan Equipment recommendations need to be updated    Co-evaluation              AM-PAC PT "6 Clicks" Mobility   Outcome Measure  Help needed turning from your back to your side while in a flat bed without using  bedrails?: None Help needed moving from lying on your back to sitting on the side of a flat bed without using bedrails?: None Help needed moving to and from a bed to a chair (including a wheelchair)?: A Little Help needed standing up from a chair using your arms (e.g., wheelchair or bedside chair)?: A Little Help needed to walk in hospital room?: A Little Help needed climbing 3-5 steps with a railing? : A Little 6 Click Score: 20    End of Session   Activity Tolerance: Patient tolerated treatment well;No increased pain Patient left: in chair;with call bell/phone within reach;with nursing/sitter in room Nurse Communication: Mobility status PT Visit Diagnosis: Other abnormalities of gait and mobility (R26.89)     Time: 9767-3419 PT Time Calculation (min) (ACUTE ONLY): 16 min  Charges:  $Therapeutic  Activity: 8-22 mins                     Wyona Almas, PT, DPT Acute Rehabilitation Services Pager [phone] Office [phone]    Deno Etienne 07/19/2020, 2:55 PM

## 2020-07-19 NOTE — Progress Notes (Signed)
4 Days Post-Op   Subjective/Chief Complaint: Continues to do well. Progressing as expected. Discontinued PCA yesterday and she did not need any PRN narcotics since. Will discontinue epidural today. She tolerated a clear liquid diet yesterday but didn't take much in due to early satiety. Tube feeds advanced to goal this morning, continues to have bowel function. No problems ambulating. Drains with serous output and posterior drain with very little output, will therefore remove today.   Objective: Vital signs in last 24 hours: Temp:  [98.4 F (36.9 C)-100.4 F (38 C)] 99.3 F (37.4 C) (04/23 0500) Pulse Rate:  [77-112] 112 (04/23 0800) Resp:  [20-33] 26 (04/23 0800) BP: (124-159)/(56-83) 145/81 (04/23 0800) SpO2:  [93 %-100 %] 93 % (04/23 0800) Weight:  [87.3 kg] 87.3 kg (04/23 0332) Last BM Date: 07/18/20  Intake/Output from previous day: 04/22 0701 - 04/23 0700 In: 2107.5 [I.V.:158.2; NG/GT:1080; IV Piggyback:713.3] Out: 325 [Drains:325] Intake/Output this shift: Total I/O In: 5 [Other:12; IV Piggyback:25] Out: -   General appearance: alert and oriented, NAD GI: soft, nontender, incision c/d/i, drains with thin serosanguinous drainage, J-tube in place Extremities: no peripheral edema  Lab Results:  Recent Labs    07/17/20 0250 07/18/20 0348  WBC 11.9* 9.6  HGB 7.8* 7.4*  HCT 24.3* 23.1*  PLT 186 167   BMET Recent Labs    07/17/20 0250 07/18/20 0348  NA 134* 132*  K 4.0 3.5  CL 103 100  CO2 22 24  GLUCOSE 129* 131*  BUN 7* 5*  CREATININE 0.77 0.79  CALCIUM 9.0 9.2   PT/INR Recent Labs    07/17/20 0250 07/18/20 0348  LABPROT 15.9* 15.1  INR 1.3* 1.2   ABG No results for input(s): PHART, HCO3 in the last 72 hours.  Invalid input(s): PCO2, PO2  Studies/Results: No results found.  Anti-infectives: Anti-infectives (From admission, onward)   Start     Dose/Rate Route Frequency Ordered Stop   07/17/20 0330  piperacillin-tazobactam (ZOSYN) IVPB  3.375 g        3.375 g 12.5 mL/hr over 240 Minutes Intravenous Every 8 hours 07/17/20 0223     07/15/20 2200  ceFAZolin (ANCEF) IVPB 2g/100 mL premix        2 g 200 mL/hr over 30 Minutes Intravenous Every 8 hours 07/15/20 1721 07/15/20 2212   07/15/20 0600  ceFAZolin (ANCEF) IVPB 2g/100 mL premix        2 g 200 mL/hr over 30 Minutes Intravenous On call to O.R. 07/15/20 0546 07/15/20 1210      Assessment/Plan: s/p Procedure(s): WHIPPLE PROCEDURE (N/A) DIAGNOSTIC LAPAROSCOPY (N/A) JEJUNOSTOMY (Left) on 4/19 (POD4). Her postop course has been complicated by intermittent fevers for which she is on Zosyn. Otherwise she is progressing very well. Had return of bowel function yesterday and is slowly increasing PO intake. Added Ensure today and advanced TF to goal.   Plan: - continue clear liquid diet, added Ensure in between meals - increase tube feeds to 48ml/hr this morning, goal is 73ml - discontinue epidural today, PRN Oxycodone, scheduled Tylenol - DVT prophylaxis with SQH, resume after epidural removal - remove posterior JP drain today, keep anterior for now  - remove R IJ central line today - transfer out of ICU to the floor - labs ordered for tomorrow, no labs needed today   LOS: 4 days   Caryn Bee 07/19/2020

## 2020-07-20 LAB — CBC
HCT: 23.7 % — ABNORMAL LOW (ref 36.0–46.0)
Hemoglobin: 7.8 g/dL — ABNORMAL LOW (ref 12.0–15.0)
MCH: 27.2 pg (ref 26.0–34.0)
MCHC: 32.9 g/dL (ref 30.0–36.0)
MCV: 82.6 fL (ref 80.0–100.0)
Platelets: 221 10*3/uL (ref 150–400)
RBC: 2.87 MIL/uL — ABNORMAL LOW (ref 3.87–5.11)
RDW: 15.2 % (ref 11.5–15.5)
WBC: 8.6 10*3/uL (ref 4.0–10.5)
nRBC: 0 % (ref 0.0–0.2)

## 2020-07-20 LAB — COMPREHENSIVE METABOLIC PANEL
ALT: 12 U/L (ref 0–44)
AST: 16 U/L (ref 15–41)
Albumin: 3.4 g/dL — ABNORMAL LOW (ref 3.5–5.0)
Alkaline Phosphatase: 115 U/L (ref 38–126)
Anion gap: 10 (ref 5–15)
BUN: 5 mg/dL — ABNORMAL LOW (ref 8–23)
CO2: 24 mmol/L (ref 22–32)
Calcium: 9 mg/dL (ref 8.9–10.3)
Chloride: 100 mmol/L (ref 98–111)
Creatinine, Ser: 0.73 mg/dL (ref 0.44–1.00)
GFR, Estimated: >60 mL/min (ref >60)
Glucose, Bld: 160 mg/dL — ABNORMAL HIGH (ref 70–99)
Potassium: 3.4 mmol/L — ABNORMAL LOW (ref 3.5–5.1)
Sodium: 134 mmol/L — ABNORMAL LOW (ref 135–145)
Total Bilirubin: 0.7 mg/dL (ref 0.3–1.2)
Total Protein: 6.8 g/dL (ref 6.5–8.1)

## 2020-07-20 LAB — GLUCOSE, CAPILLARY
Glucose-Capillary: 144 mg/dL — ABNORMAL HIGH (ref 70–99)
Glucose-Capillary: 158 mg/dL — ABNORMAL HIGH (ref 70–99)
Glucose-Capillary: 184 mg/dL — ABNORMAL HIGH (ref 70–99)
Glucose-Capillary: 132 mg/dL — ABNORMAL HIGH (ref 70–99)
Glucose-Capillary: 158 mg/dL — ABNORMAL HIGH (ref 70–99)
Glucose-Capillary: 159 mg/dL — ABNORMAL HIGH (ref 70–99)

## 2020-07-20 LAB — MAGNESIUM: Magnesium: 1.9 mg/dL (ref 1.7–2.4)

## 2020-07-20 LAB — PHOSPHORUS: Phosphorus: 2.8 mg/dL (ref 2.5–4.6)

## 2020-07-20 MED ORDER — POTASSIUM CHLORIDE 20 MEQ PO PACK
60.0000 meq | PACK | Freq: Once | ORAL | Status: AC
  Administered 2020-07-20: 60 meq via ORAL
  Filled 2020-07-20: qty 3

## 2020-07-20 MED ORDER — ENOXAPARIN SODIUM 40 MG/0.4ML ~~LOC~~ SOLN
40.0000 mg | SUBCUTANEOUS | Status: DC
  Administered 2020-07-20 – 2020-07-22 (×3): 40 mg via SUBCUTANEOUS
  Filled 2020-07-20 (×3): qty 0.4

## 2020-07-20 MED ORDER — OSMOLITE 1.5 CAL PO LIQD
1000.0000 mL | ORAL | Status: AC
  Administered 2020-07-20: 1000 mL via JEJUNOSTOMY

## 2020-07-20 NOTE — Progress Notes (Signed)
5 Days Post-Op   Subjective/Chief Complaint: Continues to do very well, despite intermittent tachycardia and very low grade fever. Epidural was removed yesterday and she denies any pain today. Advancing to soft diet today and cycling tube feeds for 14 hours over night.   Objective: Vital signs in last 24 hours: Temp:  [97.5 F (36.4 C)-100.5 F (38.1 C)] 99.4 F (37.4 C) (04/24 0100) Pulse Rate:  [89-112] 102 (04/24 0100) Resp:  [17-29] 18 (04/24 0100) BP: (127-170)/(62-82) 170/80 (04/24 0100) SpO2:  [93 %-100 %] 100 % (04/24 0100) Last BM Date: 07/19/20  Intake/Output from previous day: 04/23 0701 - 04/24 0700 In: 495.3 [P.O.:300; NG/GT:129; IV Piggyback:46.9] Out: -  Intake/Output this shift: No intake/output data recorded.  General appearance: alert and oriented, NAD GI: soft, nontender, incision c/d/i, drain with thin serous drainage, J-tube in place Extremities: no peripheral edema  Lab Results:  Recent Labs    07/18/20 0348 07/20/20 0101  WBC 9.6 8.6  HGB 7.4* 7.8*  HCT 23.1* 23.7*  PLT 167 221   BMET Recent Labs    07/18/20 0348 07/20/20 0101  NA 132* 134*  K 3.5 3.4*  CL 100 100  CO2 24 24  GLUCOSE 131* 160*  BUN 5* 5*  CREATININE 0.79 0.73  CALCIUM 9.2 9.0   PT/INR Recent Labs    07/18/20 0348  LABPROT 15.1  INR 1.2   ABG No results for input(s): PHART, HCO3 in the last 72 hours.  Invalid input(s): PCO2, PO2  Studies/Results: No results found.  Anti-infectives: Anti-infectives (From admission, onward)   Start     Dose/Rate Route Frequency Ordered Stop   07/17/20 0330  piperacillin-tazobactam (ZOSYN) IVPB 3.375 g        3.375 g 12.5 mL/hr over 240 Minutes Intravenous Every 8 hours 07/17/20 0223     07/15/20 2200  ceFAZolin (ANCEF) IVPB 2g/100 mL premix        2 g 200 mL/hr over 30 Minutes Intravenous Every 8 hours 07/15/20 1721 07/15/20 2212   07/15/20 0600  ceFAZolin (ANCEF) IVPB 2g/100 mL premix        2 g 200 mL/hr over 30  Minutes Intravenous On call to O.R. 07/15/20 0546 07/15/20 1210      Assessment/Plan: s/p Procedure(s): WHIPPLE PROCEDURE (N/A) DIAGNOSTIC LAPAROSCOPY (N/A) JEJUNOSTOMY (Left) on 4/19 (POD5). Her postop course has been complicated by intermittent fevers for which she is on Zosyn. Otherwise she is progressing very well. All lines have been removed. Advanced to soft diet today. One drain remaining which will be removed later today vs tomorrow.   Plan: - advance to soft diet, Ensure in between meals - cycling tube feeds to 56ml/hr for 14 hours over night - PRN Oxycodone, scheduled Tylenol - DVT prophylaxis with Lovenox - potential removal of remaining drain today - supplementing potassium - anticipated discharge home tomorrow   LOS: 5 days   Caryn Bee 07/20/2020

## 2020-07-21 LAB — AEROBIC/ANAEROBIC CULTURE W GRAM STAIN (SURGICAL/DEEP WOUND)

## 2020-07-21 LAB — GLUCOSE, CAPILLARY
Glucose-Capillary: 125 mg/dL — ABNORMAL HIGH (ref 70–99)
Glucose-Capillary: 129 mg/dL — ABNORMAL HIGH (ref 70–99)
Glucose-Capillary: 129 mg/dL — ABNORMAL HIGH (ref 70–99)
Glucose-Capillary: 130 mg/dL — ABNORMAL HIGH (ref 70–99)
Glucose-Capillary: 132 mg/dL — ABNORMAL HIGH (ref 70–99)
Glucose-Capillary: 154 mg/dL — ABNORMAL HIGH (ref 70–99)

## 2020-07-21 MED ORDER — SODIUM BICARBONATE 650 MG PO TABS
650.0000 mg | ORAL_TABLET | Freq: Once | ORAL | Status: AC
  Administered 2020-07-21: 650 mg
  Filled 2020-07-21: qty 1

## 2020-07-21 MED ORDER — SODIUM CHLORIDE 0.9% FLUSH: 10.0000 mL | INTRAVENOUS | Status: DC | PRN

## 2020-07-21 MED ORDER — LACTATED RINGERS IV SOLN: INTRAVENOUS | Status: DC

## 2020-07-21 MED ORDER — OSMOLITE 1.5 CAL PO LIQD: 1000.0000 mL | ORAL | Status: DC

## 2020-07-21 MED ORDER — PANCRELIPASE (LIP-PROT-AMYL) 10440-39150 UNITS PO TABS
20880.0000 [IU] | ORAL_TABLET | Freq: Once | ORAL | Status: AC
  Administered 2020-07-21: 20880 [IU]
  Filled 2020-07-21: qty 2

## 2020-07-21 MED ORDER — SODIUM CHLORIDE 0.9% FLUSH
10.0000 mL | Freq: Two times a day (BID) | INTRAVENOUS | Status: DC
  Administered 2020-07-22: 10 mL

## 2020-07-21 MED ORDER — CHLORHEXIDINE GLUCONATE CLOTH 2 % EX PADS
6.0000 | MEDICATED_PAD | Freq: Every day | CUTANEOUS | Status: DC
  Administered 2020-07-21 – 2020-07-22 (×2): 6 via TOPICAL

## 2020-07-21 NOTE — Progress Notes (Signed)
This nurse and MD attempted several times to unclog J tube. It was unsuccessful. Encouraged patient to eat this evening, meal order was placed and fluids at bedside. Will follow up on how much % consumed.

## 2020-07-21 NOTE — Progress Notes (Signed)
Continuous feeding paused and J-tube clamped due to being clogged. MD on call paged and made aware.  New orders given by Dr. Dema Severin : Lactated Ringer @100mL Rudean Hitt

## 2020-07-21 NOTE — Progress Notes (Signed)
Physical Therapy Treatment Patient Details Name: Robin Daniels MRN: [id number] DOB: 1955/12/19 Today's Date: 07/21/2020    History of Present Illness The pt is a 65 yo female presenting 4/19 for whipple with placement of pancreatic duct stent and feeding tube due to pancreatic cancer. PMH includes: neoadjuvant chemotherapy for pancreatic cancer.    PT Comments    Pt was able to walk fairly well and do there exercises as instructed.  Has been up in chair and discussed her struggle to get her feeding tube unclogged, which is hindering discharge to home.  Follow along with her to work on balance and quality of gait, and will expect her to make improvement based on increased gait distances and good response to exercises with cues.  PT follow up is still not expected to be needed.  See for goals of acute PT.   Follow Up Recommendations  No PT follow up;Supervision for mobility/OOB     Equipment Recommendations  3in1 (PT)    Recommendations for Other Services       Precautions / Restrictions Precautions Precautions: Fall;Other (comment) Precaution Comments: drains, g tube Restrictions Weight Bearing Restrictions: No    Mobility  Bed Mobility               General bed mobility comments: in cahir when PT arrived    Transfers Overall transfer level: Needs assistance Equipment used: None Transfers: Sit to/from Stand Sit to Stand: Supervision            Ambulation/Gait Ambulation/Gait assistance: Min guard Gait Distance (Feet): 350 Feet Assistive device: None Gait Pattern/deviations: Step-through pattern;Narrow base of support;Decreased stride length Gait velocity: decreased Gait velocity interpretation: <1.31 ft/sec, indicative of household ambulator General Gait Details: pt is not reaching for support once in hallway   Stairs             Wheelchair Mobility    Modified Rankin (Stroke Patients Only)       Balance Overall balance assessment: Needs  assistance Sitting-balance support: Feet supported Sitting balance-Leahy Scale: Good     Standing balance support: No upper extremity supported Standing balance-Leahy Scale: Fair                              Cognition Arousal/Alertness: Awake/alert Behavior During Therapy: WFL for tasks assessed/performed Overall Cognitive Status: Within Functional Limits for tasks assessed                                        Exercises General Exercises - Lower Extremity Ankle Circles/Pumps: AAROM;5 reps Quad Sets: 5 reps Long Arc Quad: Strengthening;10 reps Heel Slides: Strengthening;10 reps Hip ABduction/ADduction: Strengthening;10 reps    General Comments General comments (skin integrity, edema, etc.): pt was able to walk with IV pole and navigate obstacles.  Has limited endurance but good awareness of limits      Pertinent Vitals/Pain Pain Assessment: No/denies pain    Home Living                      Prior Function            PT Goals (current goals can now be found in the care plan section) Acute Rehab PT Goals Patient Stated Goal: return home Progress towards PT goals: Progressing toward goals    Frequency    Min 4X/week  PT Plan Equipment recommendations need to be updated    Co-evaluation              AM-PAC PT "6 Clicks" Mobility   Outcome Measure  Help needed turning from your back to your side while in a flat bed without using bedrails?: None Help needed moving from lying on your back to sitting on the side of a flat bed without using bedrails?: None Help needed moving to and from a bed to a chair (including a wheelchair)?: None Help needed standing up from a chair using your arms (e.g., wheelchair or bedside chair)?: A Little Help needed to walk in hospital room?: A Little Help needed climbing 3-5 steps with a railing? : A Little 6 Click Score: 21    End of Session Equipment Utilized During Treatment: Gait  belt Activity Tolerance: Patient limited by fatigue Patient left: in chair;with call bell/phone within reach Nurse Communication: Mobility status PT Visit Diagnosis: Other abnormalities of gait and mobility (R26.89)     Time: 6269-4854 PT Time Calculation (min) (ACUTE ONLY): 29 min  Charges:  $Gait Training: 8-22 mins $Therapeutic Exercise: 8-22 mins                    Ramond Dial 07/21/2020, 10:34 PM Mee Hives, PT MS Acute Rehab Dept. Number: [place] and [place]

## 2020-07-21 NOTE — Progress Notes (Addendum)
Brief Nutrition Follow-up  Pt now on GI soft diet; tolerating but eating very little. Pt did not eat breakfast this AM because she states she "did not know how to order." Pt just received lunch tray on visit. Pt states she did not eat dinner last night as she was full from drinking water. Pt did eat some lunch yesterday, mashed potatoes and some [place]. Pt reports she likes Ensure and will drink; per RN, pt declined Ensure earlier today  Currently J-tube is clogged per RN. MD is aware and efforts are being made to unclog.  RN indicates that pt vomited after receiving potassium chloride (Klor-Con) packets yesterday so RN gave per tube. Per RN, pt also received other meds per tube. RN indicates tube had been flushing fine yesterday, unsure what happened on night shift but J-tube clogged. Discussed med administration via J-tube with MD [place]. MD ok with "fully liquid meds per tube only if J-tube flushed with lots of water after med administration. No crushed meds or powders via J-tube." Discussed with RN  Nutrition Interventions:   TF Recommendations via J-tube  Osmolite 1.5 at 50 ml/hr x 14 hours per day  Provides 1050 kcals, 44 g of protein, 532 mL of free water. Meets only 50% of nutritional needs  Flush with at least 60 mL of free water before TF initiated and after TF stopped each day.   Pt currently meeting hydration needs via oral route  Recommend patient supplement po diet with Ensure/Boost 1-2 times daily until appetite improves.   If po intake does not improve, pt will need to increase TF regimen. Plan to forward RD inpatient notes to Outpatient Oncology RD for continuity of care. RD also explained that [place] [facility] have Dietitians on staff and should any issues arise, pt can reach out to these RDs  Provided patient with "Pump Tube Feeding Instructions" handout from [employer]. Briefly reviewed. Pt's TF regimen written on handout   Kerman Passey  MS, RDN, LDN, CNSC Registered Dietitian III Clinical Nutrition RD Pager and On-Call Pager Number Located in [place]

## 2020-07-21 NOTE — Progress Notes (Signed)
6 Days Post-Op   Subjective/Chief Complaint: Continues to recover well. Vitals within normals limits. No labs collected today. Unfortunately her J-tube got clogged last night. Unable to open back up this morning with unclogging kit, coke, and wire. She did do well with a soft diet yesterday and ate potatoes as well as [place]. She also had some Ensures between meals   Objective: Vital signs in last 24 hours: Temp:  [98.5 F (36.9 C)-99.5 F (37.5 C)] 98.5 F (36.9 C) (04/25 0358) Pulse Rate:  [97-112] 97 (04/25 0358) Resp:  [18] 18 (04/25 0358) BP: (142-162)/(72-73) 142/72 (04/25 0358) SpO2:  [85 %-100 %] 98 % (04/25 0358) Weight:  [87.1 kg] 87.1 kg (04/25 0500) Last BM Date: 07/19/20  Intake/Output from previous day: 04/24 0701 - 04/25 0700 In: 93.3 [I.V.:93.3] Out: -  Intake/Output this shift: No intake/output data recorded.  General appearance: alert and oriented, NAD GI: soft, nontender, incision c/d/i, J-tube in place Extremities: no peripheral edema  Lab Results:  Recent Labs    07/20/20 0101  WBC 8.6  HGB 7.8*  HCT 23.7*  PLT 221   BMET Recent Labs    07/20/20 0101  NA 134*  K 3.4*  CL 100  CO2 24  GLUCOSE 160*  BUN 5*  CREATININE 0.73  CALCIUM 9.0   PT/INR No results for input(s): LABPROT, INR in the last 72 hours. ABG No results for input(s): PHART, HCO3 in the last 72 hours.  Invalid input(s): PCO2, PO2  Studies/Results: No results found.  Anti-infectives: Anti-infectives (From admission, onward)   Start     Dose/Rate Route Frequency Ordered Stop   07/17/20 0330  piperacillin-tazobactam (ZOSYN) IVPB 3.375 g        3.375 g 12.5 mL/hr over 240 Minutes Intravenous Every 8 hours 07/17/20 0223     07/15/20 2200  ceFAZolin (ANCEF) IVPB 2g/100 mL premix        2 g 200 mL/hr over 30 Minutes Intravenous Every 8 hours 07/15/20 1721 07/15/20 2212   07/15/20 0600  ceFAZolin (ANCEF) IVPB 2g/100 mL premix        2 g 200 mL/hr over 30 Minutes  Intravenous On call to O.R. 07/15/20 0546 07/15/20 1210      Assessment/Plan: s/p Procedure(s): WHIPPLE PROCEDURE (N/A) DIAGNOSTIC LAPAROSCOPY (N/A) JEJUNOSTOMY (Left) on 4/19 (POD6). Her J-tube got clogged last night and is currently not usable. She did however do well with a diet yesterday and should be able to discharge home later today.  Plan: - continue soft diet and Ensure - pain meds PRN - PPI - DVT prophylaxis with Lovenox for 4 weeks - anticipated discharge home later today   LOS: 6 days   Caryn Bee 07/21/2020

## 2020-07-21 NOTE — TOC Initial Note (Addendum)
Transition of Care [facility]) - Initial/Assessment Note    Patient Details  Name: Robin Daniels MRN: [id number] Date of Birth: Nov 08, 1955  [facility] of [facility]) CM/SW Contact:    Marilu Favre, RN Phone Number: 07/21/2020, 10:33 AM  Clinical Narrative:                 Confirmed face sheet information with patient at bedside.   MD requesting tube feedings at home 14 hours a day at 50 cc/hr. NCM called dietician for calorie , protein etc . Once note entered , NCM will place home tube feeding order for MD to sign.    Cory with [facility] accepted referral for [facility] and PT start of care may be Wednesday or Thursday , patient aware.   Pam with [facility] accepted referral for home tube feeding. Pam will come to patient's hospital room and provide teaching prior to discharge today. Patient aware . Patient aware of start of care date for [facility] services and nurse will not be present daily to flush , connect and disconnect tube feedings.   Patient lives with her mother and son. Her sister and daughter visit frequently.  Patient has already received tub bench and 3 in 1 .   Bedside nurse currently working to [employer] J tube   Expected Discharge Plan: [place] [facility] Barriers to Discharge: Other (comment) (J tube clogged)   Patient Goals and CMS Choice Patient states their goals for this hospitalization and ongoing recovery are:: to return to home CMS Medicare.gov Compare Post Acute Care list provided to:: Patient Choice offered to / list presented to : Patient  Expected Discharge Plan and Services Expected Discharge Plan: [place]   Discharge Planning Services: CM Consult Post Acute Care Choice: Home Health,Durable Medical Equipment Living arrangements for the past 2 months: Apartment                 DME Arranged: Tub bench,3-N-1 DME Agency: [employer] Date DME Agency Contacted: 07/21/20 Time DME Agency Contacted: [id number] Representative spoke with at DME  Agency: [facility] Arranged: PT,RN [place]: [place] Date [place]: 07/21/20 Time [place]: 51 Representative spoke with at [place]: [place] Arrangements/Services Living arrangements for the past 2 months: Apartment Lives with:: Adult Children Patient language and need for interpreter reviewed:: Yes Do you feel safe going back to the place where you live?: Yes      Need for Family Participation in Patient Care: Yes (Comment) Care giver support system in place?: Yes (comment)   Criminal Activity/Legal Involvement Pertinent to Current Situation/Hospitalization: No - Comment as needed  Activities of Daily Living Home Assistive Devices/Equipment: Eyeglasses ADL Screening (condition at time of admission) Patient's cognitive ability adequate to safely complete daily activities?: Yes Is the patient deaf or have difficulty hearing?: No Does the patient have difficulty seeing, even when wearing glasses/contacts?: No Does the patient have difficulty concentrating, remembering, or making decisions?: No Patient able to express need for assistance with ADLs?: Yes Does the patient have difficulty dressing or bathing?: No Independently performs ADLs?: Yes (appropriate for developmental age) Does the patient have difficulty walking or climbing stairs?: No Weakness of Legs: Both Weakness of Arms/Hands: Both  Permission Sought/Granted   Permission granted to share information with : No              Emotional Assessment Appearance:: Appears stated age Attitude/Demeanor/Rapport: Engaged Affect (typically observed): Accepting  Orientation: : Oriented to Self,Oriented to Place,Oriented to  Time,Oriented to Situation Alcohol / Substance Use: Not Applicable Psych Involvement: No (comment)  Admission diagnosis:  Pancreatic cancer ([place]) [C25.9] 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 Pancreatic cancer ([place]) 07/15/2020  . 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 Anemia due to antineoplastic chemotherapy 04/01/2020  . Cancer of head of pancreas ([place]) 12/04/2019  . Hypokalemia 11/14/2019  . Elevated liver enzymes 11/14/2019  . Bilateral primary osteoarthritis of hip 09/12/2019  . Obesity 12/07/2015  . Heart palpitations 02/18/2015  . Hypertension goal BP (blood pressure) < 140/90 10/17/2014  . Pure hypercholesterolemia 10/17/2014  . H/O total knee replacement 10/17/2014  . Anxiety 09/11/2014  . HLD (hyperlipidemia) 09/11/2014  . Migraine without aura and responsive to treatment 09/11/2014  . Nocturnal cough 09/11/2014  . Anemia, iron deficiency 09/11/2014  . Plantar fasciitis 09/11/2014  . IFG (impaired fasting glucose) 09/11/2014  . Avitaminosis D 09/11/2014  . Absence of interventricular septum 09/11/2014   PCP:  [place]:   [facility] [address] ([address]), [place])  [address] Phone: [phone] Fax: [phone]     Social Determinants of Health (SDOH) Interventions    Readmission Risk Interventions No flowsheet data found.

## 2020-07-21 NOTE — Care Management (Addendum)
Possible discharge today on TF 50 cc/hr and 14 hours a day . Called Dietician [place] and placed consult order for calculations   etc.    Magdalen Spatz RN

## 2020-07-22 LAB — CULTURE, BLOOD (ROUTINE X 2)
Culture: NO GROWTH
Culture: NO GROWTH
Special Requests: ADEQUATE
Special Requests: ADEQUATE

## 2020-07-22 LAB — GLUCOSE, CAPILLARY
Glucose-Capillary: 125 mg/dL — ABNORMAL HIGH (ref 70–99)
Glucose-Capillary: 129 mg/dL — ABNORMAL HIGH (ref 70–99)
Glucose-Capillary: 139 mg/dL — ABNORMAL HIGH (ref 70–99)
Glucose-Capillary: 140 mg/dL — ABNORMAL HIGH (ref 70–99)
Glucose-Capillary: 142 mg/dL — ABNORMAL HIGH (ref 70–99)

## 2020-07-22 MED ORDER — ENSURE ENLIVE PO LIQD: 237.0000 mL | Freq: Two times a day (BID) | ORAL | 12 refills | Status: DC

## 2020-07-22 MED ORDER — METHOCARBAMOL 500 MG PO TABS: 500.0000 mg | ORAL_TABLET | Freq: Three times a day (TID) | ORAL | 1 refills | Status: DC | PRN

## 2020-07-22 MED ORDER — HEPARIN SOD (PORK) LOCK FLUSH 100 UNIT/ML IV SOLN
500.0000 [IU] | INTRAVENOUS | Status: AC | PRN
  Administered 2020-07-22: 500 [IU]
  Filled 2020-07-22: qty 5

## 2020-07-22 MED ORDER — ONDANSETRON 4 MG PO TBDP: 4.0000 mg | ORAL_TABLET | Freq: Four times a day (QID) | ORAL | 0 refills | Status: DC | PRN

## 2020-07-22 MED ORDER — AMOXICILLIN-POT CLAVULANATE 875-125 MG PO TABS: 1.0000 | ORAL_TABLET | Freq: Two times a day (BID) | ORAL | 0 refills | Status: AC

## 2020-07-22 MED ORDER — PANTOPRAZOLE SODIUM 40 MG PO TBEC: 40.0000 mg | DELAYED_RELEASE_TABLET | Freq: Every day | ORAL | 2 refills | Status: DC

## 2020-07-22 MED ORDER — OXYCODONE HCL 5 MG PO TABS: 5.0000 mg | ORAL_TABLET | ORAL | 0 refills | Status: DC | PRN

## 2020-07-22 NOTE — Progress Notes (Signed)
PT Cancellation Note  Patient Details Name: Robin Daniels MRN: [id number] DOB: 03-08-1956   Cancelled Treatment:    Reason Eval/Treat Not Completed: Other (comment). Heading home today and feels comfortable with gait.  Encouraged pt to let PCP know if she becomes weaker to get PT started later.   Ramond Dial 07/22/2020, 1:11 PM   Mee Hives, PT MS Acute Rehab Dept. Number: [place]

## 2020-07-22 NOTE — Progress Notes (Signed)
7 Days Post-Op   Subjective/Chief Complaint: No significant changes over night. Vitals stable within normal limits, mild hypertension.  She did well with dinner, ate a good amount, no nausea.  J-tube still clogged this morning. Will have one more attempt to reopen with a different wire this am.   Objective: Vital signs in last 24 hours: Temp:  [98.6 F (37 C)-99.9 F (37.7 C)] 98.6 F (37 C) (04/26 0439) Pulse Rate:  [101] 101 (04/26 0439) Resp:  [16-17] 16 (04/26 0439) BP: (156-164)/(72-86) 164/86 (04/26 0439) SpO2:  [98 %] 98 % (04/26 0439) Last BM Date: 07/21/20  Intake/Output from previous day: 04/25 0701 - 04/26 0700 In: 240 [P.O.:240] Out: -  Intake/Output this shift: No intake/output data recorded.  General appearance: alert and oriented, NAD GI: soft, nontender, incision c/d/i, J-tube in place (occluded) Extremities: no peripheral edema  Lab Results:  Recent Labs    07/20/20 0101  WBC 8.6  HGB 7.8*  HCT 23.7*  PLT 221   BMET Recent Labs    07/20/20 0101  NA 134*  K 3.4*  CL 100  CO2 24  GLUCOSE 160*  BUN 5*  CREATININE 0.73  CALCIUM 9.0   PT/INR No results for input(s): LABPROT, INR in the last 72 hours. ABG No results for input(s): PHART, HCO3 in the last 72 hours.  Invalid input(s): PCO2, PO2  Studies/Results: No results found.  Anti-infectives: Anti-infectives (From admission, onward)   Start     Dose/Rate Route Frequency Ordered Stop   07/17/20 0330  piperacillin-tazobactam (ZOSYN) IVPB 3.375 g        3.375 g 12.5 mL/hr over 240 Minutes Intravenous Every 8 hours 07/17/20 0223     07/15/20 2200  ceFAZolin (ANCEF) IVPB 2g/100 mL premix        2 g 200 mL/hr over 30 Minutes Intravenous Every 8 hours 07/15/20 1721 07/15/20 2212   07/15/20 0600  ceFAZolin (ANCEF) IVPB 2g/100 mL premix        2 g 200 mL/hr over 30 Minutes Intravenous On call to O.R. 07/15/20 0546 07/15/20 1210      Assessment/Plan: s/p Procedure(s): WHIPPLE  PROCEDURE (N/A) DIAGNOSTIC LAPAROSCOPY (N/A) JEJUNOSTOMY (Left) on 4/19 (POD7). Her J-tube got clogged 2 nights ago and is currently not usable. She has however done well with a regular diet and should be able to discharge home later today.  Plan: - continue regular diet and Ensure supplements between meals - will try to reopen J-tube one more time with different wire - pain meds PRN - PPI - DVT prophylaxis with Lovenox for 4 weeks - anticipated discharge home later today - outpatient follow up next week with labs and possible IR J-tube replacement   LOS: 7 days   Caryn Bee 07/22/2020

## 2020-07-22 NOTE — Progress Notes (Signed)
Pt discharging home, DC instructions given, verbal and written.  Pt stated understanding  and had no questions.  Phone number to provider's office provided for questions and concerns.   Daughter pick up Pt and took her home.

## 2020-07-22 NOTE — Discharge Instructions (Signed)
[facility]      [facility] °[phone] ° °ABDOMINAL SURGERY: POST OP INSTRUCTIONS ° °Always review your discharge instruction sheet given to you by the facility where your surgery was performed. ° °IF YOU HAVE DISABILITY OR FAMILY LEAVE FORMS, YOU MUST BRING THEM TO THE OFFICE FOR PROCESSING.  PLEASE DO NOT GIVE THEM TO YOUR DOCTOR. ° °1. A prescription for pain medication may be given to you upon discharge.  Take your pain medication as prescribed, if needed.  If narcotic pain medicine is not needed, then you may take acetaminophen (Tylenol) or ibuprofen (Advil) as needed. °2. Take your usually prescribed medications unless otherwise directed. °3. If you need a refill on your pain medication, please contact your pharmacy. They will contact our office to request authorization.  Prescriptions will not be filled after 5pm or on week-ends. °4. You should follow a light diet the first few days after arrival home, such as soup and crackers, pudding, etc.unless your doctor has advised otherwise. A high-fiber, low fat diet can be resumed as tolerated.   Be sure to include lots of fluids daily. Most patients will experience some swelling and bruising on the chest and neck area.  Ice packs will help.  Swelling and bruising can take several days to resolve °5. Most patients will experience some swelling and bruising in the area of the incision. Ice pack will help. Swelling and bruising can take several days to resolve..  °6. It is common to experience some constipation if taking pain medication after surgery.  Increasing fluid intake and taking a stool softener will usually help or prevent this problem from occurring.  A mild laxative (Milk of Magnesia or Miralax) should be taken according to package directions if there are no bowel movements after 48 hours. °7.  You may have steri-strips (small skin tapes) in place directly over the incision.  These strips should be left on the skin for 10-14 days.  If your  surgeon used skin glue on the incision, you may shower in 48 hours.  The glue will flake off over the next 2-3 weeks.  Any sutures or staples will be removed at the office during your follow-up visit. You may find that a light gauze bandage over your incision may keep your staples from being rubbed or pulled. You may shower and replace the bandage daily. °8. ACTIVITIES:  You may resume regular (light) daily activities beginning the next day--such as daily self-care, walking, climbing stairs--gradually increasing activities as tolerated.  You may have sexual intercourse when it is comfortable.  Refrain from any heavy lifting or straining until approved by your doctor. °a. You may drive when you no longer are taking prescription pain medication, you can comfortably wear a seatbelt, and you can safely maneuver your car and apply brakes °b. Return to Work: __________8 weeks if applicable_________________________ °9. You should see your doctor in the office for a follow-up appointment approximately two weeks after your surgery.  Make sure that you call for this appointment within a day or two after you arrive home to insure a convenient appointment time. °OTHER INSTRUCTIONS:  °_____________________________________________________________ °_____________________________________________________________ ° °WHEN TO CALL YOUR DOCTOR: °1. Fever over 101.0 °2. Inability to urinate °3. Nausea and/or vomiting °4. Extreme swelling or bruising °5. Continued bleeding from incision. °6. Increased pain, redness, or drainage from the incision. °7. Difficulty swallowing or breathing °8. Muscle cramping or spasms. °9. Numbness or tingling in hands or feet or around lips. ° °The clinic staff is   available to answer your questions during regular business hours.  Please don’t hesitate to call and ask to speak to one of the nurses if you have concerns. ° °For further questions, please visit [email or web address] ° ° ° °

## 2020-07-22 NOTE — Plan of Care (Signed)

## 2020-07-22 NOTE — Progress Notes (Signed)
Brief Nutrition Follow-up:  Pt being discharged today. Despite multiple attempts, J-tube unable to be unclogged. Pt will not be discharging with TF at this time  RD discussed with patient that she will now be relying solely on her oral intake to meet nutritional needs given the fact that the J-tube is not functioning. Discussed importance of continuing to increase po intake post discharge, importance of eating a protein source at each meal and use of oral nutrition supplements.    Recommend use of Ensure or Boost type supplement 1-2 times daily initially post discharge. Pt is in agreement  Outpatient Oncology RD at [facility] is aware of pt and plans to follow-up re: any post discharge nutrition concerns  Kerman Passey MS, RDN, LDN, CNSC Registered Dietitian III Clinical Nutrition RD Pager and On-Call Pager Number Located in [place]

## 2020-07-22 NOTE — Discharge Summary (Signed)
Physician Discharge Summary  Patient ID: Robin Daniels MRN: [id number] DOB/AGE: [birth year] 65 y.o.  Admit date: 07/15/2020 Discharge date: 07/22/2020  Admission Diagnoses: pancreatic head adenocarcinoma  Discharge Diagnoses:  Active Problems:   Pancreatic cancer [facility])   Adenocarcinoma of head of pancreas [facility])  Surgery: Open Pancreaticoduodenectomy on 07/15/2020  Discharged Condition: good  Hospital Course: Robin Daniels presented to the hospital on the morning of her surgery on 07/15/2020.  She underwent an open pancreaticoduodenectomy without any complications.  Details of the procedure are dictated in a separate operative note from the day of surgery.  Postoperatively she was admitted to the surgical ICU for postoperative care.  Here she started immediately to recover well from her surgery.  Her pain was controlled with an epidural catheter.  A NG tube was left in place postoperatively for GI decompression and we kept her hydrated with IV fluids. On postoperative day 1 she was able to get out of bed and ambulate within her room and go to the bathroom.  On the night of postop day 1 she had a low-grade fever and antibiotics with IV Zosyn were started due to concern for a intra-abdominal infection because she had frank pus in her gallbladder intraoperatively. On the morning of postop day 2 the urinary catheter was removed and she was able to void without any difficulties later in the day.  The NG tube was also removed and she was started on sips and ice chips.  Additionally, trickle tube feeds at a rate of 20 mL/h were started through the J-tube, which she all tolerated very well. Robin Daniels was able to increase her activity every day and on postop day 3 was able to walk the hallways as well as stairs with physical therapy.  She also started a liquid diet and we increased her tube feeds gradually to goal rate of 55 mL/h.  Her Dilaudid PCA was discontinued and her pain was well controlled with just  epidural.  The next day her epidural was paused in the morning and her pain continued to be well controlled.  Therefore the epidural catheter was removed in the early afternoon.  Following removal of the epidural catheter as well as her central line she was able to be transferred out of the ICU to the regular surgical ward. Here she continued to recover very well and increased her p.o. intake as well as activity every day.  Her intraoperatively placed JP drains were removed on postop day 4 and postop day 5 respectively. As she was getting ready to be discharged on postop day 6, her J-tube unfortunately clogged after the administration of potassium through the J-tube.  This required her to stay another night due to efforts to reopen the J-tube with different solutions as well as wires.  This was ultimately unsuccessful.  However, given her excellent p.o. intake we will rely on her nutrition p.o. and she was discharged home on postop day 7.  A 10-day course of antibiotics will be finished at home.   Consults: nutrition, PT, OT  Discharge Exam: Blood pressure (!) 164/86, pulse (!) 101, temperature 98.6 F (37 C), temperature source Oral, resp. rate 16, height 5\' 3"  (1.6 m), weight 87.1 kg, SpO2 98 %. General appearance:alert and oriented, in good spirits, no acute distress Resp: clear to auscultation bilateral, normal work of breathing Cardio: RRR GI: soft, nondistended, mild tenderness to palpation, incision with staples and healing well, LUQ J-tube (clogged), RLQ drain sites with some fibrinous exudate Extremities: no lower  extremity edema  Disposition: Discharge disposition: 01-Home or Self Care       Discharge Instructions    Call MD for:  difficulty breathing, headache or visual disturbances   Complete by: As directed    Call MD for:  hives   Complete by: As directed    Call MD for:  persistant nausea and vomiting   Complete by: As directed    Call MD for:  redness, tenderness, or  signs of infection (pain, swelling, redness, odor or green/yellow discharge around incision site)   Complete by: As directed    Call MD for:  severe uncontrolled pain   Complete by: As directed    Call MD for:  temperature >100.4   Complete by: As directed    Diet - low sodium heart healthy   Complete by: As directed    Increase activity slowly   Complete by: As directed        Follow-up Information    Stark Klein, MD Follow up in 2 week(s).   Specialty: General Surgery Contact information: [place] [address] [phone]        [facility], [place] Follow up on 07/29/2020.   Specialty: General Surgery Why: 10:00 Contact information: [place] [address] [phone]               Signed: Caryn Bee 07/22/2020, 1:25 PM

## 2020-07-23 ENCOUNTER — Telehealth: Payer: Self-pay | Admitting: Internal Medicine

## 2020-07-23 NOTE — Telephone Encounter (Signed)
Spoke with patient to make her aware of change in time for Friday's appt to allow for IVF in addition to seeing nutrition. Patient agreeable.

## 2020-07-24 ENCOUNTER — Telehealth: Payer: Self-pay | Admitting: *Deleted

## 2020-07-24 NOTE — Telephone Encounter (Signed)
Just received call from Carolynn Sayers, Representative from [place] and Dr Marlowe Aschoff office will be reaching out to patient to make follow-up appointment for her next week to see Dr Barry Dienes for possible tube replacement.

## 2020-07-24 NOTE — Telephone Encounter (Addendum)
Robin Daniels with [facility] called stating that she went out to open patient to services and that patient told her that J tube is clogged and is to be replaced next week, but does not know when where or by whom. [name] said she needs to speak with someone about this and that she did not open patient to home care services at this time. I do not see anything scheduled for replacement in her appointment schedule. Please return Robin Daniels's call

## 2020-07-24 NOTE — Telephone Encounter (Signed)
Robin Daniels this is the message that [facility] sent me yesterday. Robin Daniels asked me to schedule the patient for her on Friday. Dr. B had me schedule the patient on Friday as well for iv fluids.  Dr. B Please advise.  I received an update today about this patient and her feeding tube (J-tube) was clogged and is not functional so she will not be receiving nocturnal tube feeding.  The tube can't be exchanged per surgery. She is being discharged today with little oral intake and no tube feeding.  Home Health representative and inpatient RD are extremely concerned about this patient and her lack of nutrition.

## 2020-07-24 NOTE — Telephone Encounter (Signed)
Dr. Barry Dienes. Please see below phone notes. Can the feeding tube be replaced

## 2020-07-24 NOTE — Telephone Encounter (Signed)
[place]

## 2020-07-24 NOTE — Telephone Encounter (Signed)
I do not have any definitive information regarding if tube will be replaced next week.  Dr Barry Dienes would need to address if tube can safely be replaced and when.

## 2020-07-24 NOTE — Telephone Encounter (Signed)
Robin Daniels - any other updates besides the one you already provided re: the tube. It's my understanding that the tube can not be replaced. This is still correct. Pls advise so that I can communicate to patient's caregiver.

## 2020-07-25 ENCOUNTER — Other Ambulatory Visit (HOSPITAL_COMMUNITY): Payer: Self-pay | Admitting: General Surgery

## 2020-07-25 ENCOUNTER — Inpatient Hospital Stay: Payer: Medicare Other

## 2020-07-25 ENCOUNTER — Other Ambulatory Visit: Payer: Self-pay

## 2020-07-25 ENCOUNTER — Inpatient Hospital Stay: Payer: Medicare Other | Attending: Internal Medicine

## 2020-07-25 VITALS — BP 162/77 | HR 102 | Temp 97.1°F | Resp 16 | Wt 183.8 lb

## 2020-07-25 DIAGNOSIS — Z934 Other artificial openings of gastrointestinal tract status: Secondary | ICD-10-CM

## 2020-07-25 DIAGNOSIS — C259 Malignant neoplasm of pancreas, unspecified: Secondary | ICD-10-CM | POA: Diagnosis present

## 2020-07-25 DIAGNOSIS — Z452 Encounter for adjustment and management of vascular access device: Secondary | ICD-10-CM | POA: Insufficient documentation

## 2020-07-25 DIAGNOSIS — E860 Dehydration: Secondary | ICD-10-CM | POA: Insufficient documentation

## 2020-07-25 DIAGNOSIS — R633 Feeding difficulties, unspecified: Secondary | ICD-10-CM

## 2020-07-25 MED ORDER — SODIUM CHLORIDE 0.9 % IV SOLN
Freq: Once | INTRAVENOUS | Status: AC
  Filled 2020-07-25: qty 250

## 2020-07-25 MED ORDER — HEPARIN SOD (PORK) LOCK FLUSH 100 UNIT/ML IV SOLN
500.0000 [IU] | Freq: Once | INTRAVENOUS | Status: AC
  Administered 2020-07-25: 500 [IU] via INTRAVENOUS
  Filled 2020-07-25: qty 5

## 2020-07-25 MED ORDER — SODIUM CHLORIDE 0.9% FLUSH
10.0000 mL | INTRAVENOUS | Status: DC | PRN
  Administered 2020-07-25: 10 mL via INTRAVENOUS
  Filled 2020-07-25: qty 10

## 2020-07-25 NOTE — Progress Notes (Signed)
Pt is receiving 1 Liter of NS for hydration. Pt reports she is taking medications, food and liquids by mouth. She ate pancakes and hash browns for breakfast this morning. Tries to drink 3 ensure per day.Bowels are moving. Denies pain. RD, Joli, has a visit with patient as well in clinic. Pt discharged from clinic. RN ambulated with pt to downstairs lobby. 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

## 2020-07-25 NOTE — Progress Notes (Signed)
Nutrition Assessment   Reason for Assessment:  Referral inpatient RD team   ASSESSMENT:  65 year old female with pancreatic cancer. Past medical history of HLD, HTN, anemia.  Patient s/p 10 cyclees of folfirinox chemotherapy.  S/p whipple procedure on 4/19 with J-tube placement.  J-tube clogged, not functional prior to discharge.    Met with patient today in clinic.  IV fluids being given today.  Patient reports that appetite is fairly good since being home.  24 hour recall taken. Ate 2 strips of bacon, 1 scrambled egg yesterday for breakfast (~9am) ate something else around 2:30 pm but couldn't remember.  Later drank and ensure shake (high protein), no dinner.  Says that she is drinking about 16 oz water and couple of large glasses of unsweet tea.  Denies nausea, reflux, abdominal pain. Reports having bowel movement about 2 times per day (loose with more formed stool).    Patient reports that she lives with mother and son.  She has a daughter that checks on her as well as multiple sisters.  Family prepares meals for her  Patient says that she thinks she has an appointment on Wed of this coming week 5/5 to see Dr Barry Dienes  Dr Marlowe Aschoff note reviewed and noted on follow-up next week will get J-tube changed out if not eating enough  Medications: Vit b 12, protonix, zofran   Labs: no new labs taken today   Anthropometrics:   Height: 63 inches Weight: 183 lb 8 oz today in cancer center UBW: 186 lb 3/29 prior to hospitalization Last visit at cancer center BMI: 32  Noted 192 4/25 hospital weight (fluids) 2% weight loss in the last month   Estimated Energy Needs  Kcals: 2075-2400 Protein: 104-120 g Fluid: > 2 L   NUTRITION DIAGNOSIS: Inadequate oral intake related to cancer and cancer related treatment (recent surgery) as evidenced by 2% weight loss in the last month and decreased intake   INTERVENTION:  Encouraged patient to make sure she has a follow-up appointment with Dr  Barry Dienes next week and if not to call her office.  She verbalized understanding. Discussed importance of maintaining nutrition and weight for healing and recovery. If unable to maintain weight will need J-tube changed out to supplement oral nutrition.  Discussed 5-6 "mini meals" with patient Encouraged protein source at every meal.   Discussed foods to choose with recent Whipple procedure.  Handout provided Provided samples of ensure complete, boost plus, orgain and [employer] 1.4 shake.  Encouraged 2 per day, if possible Contact information given   MONITORING, EVALUATION, GOAL: weight trends, intake   Next Visit: Tuesday, May 3rd in clinic   [place]. Zenia Resides, [place], [place] Registered Dietitian [phone] (mobile)

## 2020-07-25 NOTE — Telephone Encounter (Signed)
Patient made aware of plan. She states that already has an apt with Dr. Barry Dienes next wed.

## 2020-07-25 NOTE — Telephone Encounter (Signed)
Thank you for your reply. I will have the patient/patient's family call your office to coordinate.

## 2020-07-29 ENCOUNTER — Inpatient Hospital Stay: Payer: Medicare Other | Attending: Internal Medicine

## 2020-07-29 DIAGNOSIS — E876 Hypokalemia: Secondary | ICD-10-CM | POA: Insufficient documentation

## 2020-07-29 DIAGNOSIS — Z452 Encounter for adjustment and management of vascular access device: Secondary | ICD-10-CM | POA: Insufficient documentation

## 2020-07-29 DIAGNOSIS — G620 Drug-induced polyneuropathy: Secondary | ICD-10-CM | POA: Insufficient documentation

## 2020-07-29 DIAGNOSIS — D649 Anemia, unspecified: Secondary | ICD-10-CM | POA: Insufficient documentation

## 2020-07-29 DIAGNOSIS — L299 Pruritus, unspecified: Secondary | ICD-10-CM | POA: Insufficient documentation

## 2020-07-29 DIAGNOSIS — I10 Essential (primary) hypertension: Secondary | ICD-10-CM | POA: Insufficient documentation

## 2020-07-29 DIAGNOSIS — E860 Dehydration: Secondary | ICD-10-CM | POA: Insufficient documentation

## 2020-07-29 DIAGNOSIS — C250 Malignant neoplasm of head of pancreas: Secondary | ICD-10-CM | POA: Insufficient documentation

## 2020-07-29 DIAGNOSIS — E46 Unspecified protein-calorie malnutrition: Secondary | ICD-10-CM | POA: Insufficient documentation

## 2020-07-29 NOTE — Progress Notes (Signed)
Nutrition  Patient was scheduled to see RD today in clinic but did not show up.    RD called patient but no answer.  Left message on voicemail with RD's call back number.    Josef Tourigny B. Zenia Resides, [place], [place] Registered Dietitian [phone] (mobile)

## 2020-07-30 ENCOUNTER — Other Ambulatory Visit (HOSPITAL_COMMUNITY): Payer: Self-pay | Admitting: General Surgery

## 2020-07-30 ENCOUNTER — Other Ambulatory Visit: Payer: Self-pay

## 2020-07-30 ENCOUNTER — Ambulatory Visit (HOSPITAL_COMMUNITY)
Admission: RE | Admit: 2020-07-30 | Discharge: 2020-07-30 | Disposition: A | Discharge status: Home / Self Care | Payer: Medicare Other | Source: Ambulatory Visit | Attending: General Surgery | Admitting: General Surgery

## 2020-07-30 DIAGNOSIS — Z934 Other artificial openings of gastrointestinal tract status: Secondary | ICD-10-CM

## 2020-07-30 DIAGNOSIS — R633 Feeding difficulties, unspecified: Secondary | ICD-10-CM

## 2020-07-30 HISTORY — PX: IR FLUORO RM 30-60 MIN: IMG2384

## 2020-07-30 MED ORDER — IOHEXOL 300 MG/ML  SOLN
50.0000 mL | Freq: Once | INTRAMUSCULAR | Status: AC | PRN
  Administered 2020-07-30: 25 mL

## 2020-08-05 ENCOUNTER — Inpatient Hospital Stay: Payer: Medicare Other | Admitting: Internal Medicine

## 2020-08-05 ENCOUNTER — Inpatient Hospital Stay: Payer: Medicare Other

## 2020-08-05 ENCOUNTER — Telehealth: Payer: Self-pay | Admitting: *Deleted

## 2020-08-05 ENCOUNTER — Inpatient Hospital Stay: Payer: Medicare Other | Attending: Internal Medicine

## 2020-08-05 NOTE — Progress Notes (Signed)
Nutrition  Patient was no show for nutrition appointment today.  [name] B. Zenia Resides, [place], [place] Registered Dietitian [phone] (mobile)

## 2020-08-05 NOTE — Telephone Encounter (Signed)
Left vm for patient to return my phone call. Patient no showed for her apts today.

## 2020-08-06 NOTE — Telephone Encounter (Signed)
Unable to reach patient. Left detailed vm for patient's daughter, Bronson Ing  who is on the DPR, to call me back.

## 2020-08-07 ENCOUNTER — Telehealth: Payer: Self-pay

## 2020-08-07 ENCOUNTER — Telehealth: Payer: Self-pay | Admitting: *Deleted

## 2020-08-07 DIAGNOSIS — C250 Malignant neoplasm of head of pancreas: Secondary | ICD-10-CM

## 2020-08-07 NOTE — Telephone Encounter (Signed)
Attempted to reach patient. No answer. Left detailed vm for patient to call our office back asap. Specifically asked patient to return our phone call so she could be evaluated in [facility] She does not have any further apts in our clinic and no showed for the apts she had this week. multiple attempts have been made to reach patient.   Per Desmond Lope, home health nurse visited the patient. Home Health nurse asked her "why she missed appointment to the cancer center on 5/10 and she reported that she has numbness in her feet from the chemotherapy and can't feel the pedals to drive and had no one to bring her to appointments. "

## 2020-08-07 NOTE — Telephone Encounter (Signed)
Patient called back and provided new phone number. She reports increasing neuropathy in her feet. She is not currently taking anything for the neuropathy. She believes that she can possibly drive herself tomorrow the apt as she just lives a few miles from the cancer center.   Apt given to see Robin Baller, NP tomorrow in clinic. Pt will arrive around 9 am for port lab. Nira Conn, RN will access her port. - lab/ smc / possible IV fluids

## 2020-08-07 NOTE — Telephone Encounter (Signed)
Nutrition  RD received message from RN, [name] with [facility] (home health) regarding patient not tolerating tube feeding.  RN reports that on Friday night patient started tube feeding and felt nauseated and vomited so turned tube feeding off. RN stated on voicemail that patient does not want to do any more tube feeding.  RN wanting guidance on next steps.    RD called RN, [name] back this am and left message.    RD also called patient but was unable to reach her. Left message on voice mail asking her to call RD back.    Bronco Mcgrory B. Zenia Resides, [place], [place] Registered Dietitian [phone] (mobile)

## 2020-08-07 NOTE — Telephone Encounter (Signed)
[name]. Pt can not be reached. We have attempted to reach out to the pt multiple times. No answer. Do you think she would benefit from home palliative care?

## 2020-08-07 NOTE — Telephone Encounter (Signed)
I successfully contacted the patient's daughter, who is listed as emergency contact on DPR. She states that patient is currently home. She will attempt to reach her and discuss with her on the importance of returning to the clinic for follow-up. Pt did have a whipple procedure and she has been recovering from this. I explained to daughter that our providers need to follow-up with her to discuss the symptoms of worsening neuropathy that was reported to the Home health RN. I also discussed the concern regarding transportation. Daughter states that she is not able to get off of work until after 430 pm to take her mother to any providers. I did offer even a virtual visit with smc and/or a palliaitve care consult. She will speak to mother to determine what the patient would like to do and give me a call back. My call back phone # was provided  [phone]) to discuss the patient's care with daughter.

## 2020-08-07 NOTE — Telephone Encounter (Signed)
Yes, I think that is a great idea. However, it may also be a barrier for them to schedule if she does not answer the phone. Will place order.

## 2020-08-07 NOTE — Telephone Encounter (Addendum)
Nutrition  RN, [name] from [facility] called RD back this am regarding patient.  [name] reports that on Thursday, 5/5 she went out to educate patient on J-tube feeding and how to hook up pump and start feeding at 5pm.  Received call from patient that pump was not working Camera operator did a face to face with patient and machine was not working.  New machine (pump) was delivered on Friday, 5/6.  Patient started feeding and started to get nauseated and vomited so patient stopped feeding on Friday.  [name] reports that patient says that she is eating.  Flat affect only answers questions with yes and no, hard to get patient to elaborate per RN. RN reports that patient expressed to her that she does not want to do tube feeding.   RN asked her why she missed appointment to the cancer center on 5/10 and she reported that she has numbness in her feet from the chemotherapy and can't feel the pedals to drive and had no one to bring her to appointments.    Recommendations: If patient willing to restart feeding take nausea medication at least 30 minutes before starting feeding and start at 22ml/hr.  Other option would be could try different tube feeding formula.  RN reports that she will try and get in touch with patient but she was adamant that she did not want to try tube feeding again. RD will route note to Dr Rogue Bussing and Dr Barry Dienes.  RD left message with patient earlier this am (see previous note) Patient has been instructed by RN, [name] to flush J-tube at least daily with 80ml of water to keep patent until removed.   Jaselyn Nahm B. Zenia Resides, [place], [place] Registered Dietitian [phone] (mobile)

## 2020-08-08 ENCOUNTER — Inpatient Hospital Stay (HOSPITAL_BASED_OUTPATIENT_CLINIC_OR_DEPARTMENT_OTHER): Payer: Medicare Other | Admitting: Oncology

## 2020-08-08 ENCOUNTER — Encounter: Payer: Self-pay | Admitting: Oncology

## 2020-08-08 ENCOUNTER — Inpatient Hospital Stay: Payer: Medicare Other

## 2020-08-08 ENCOUNTER — Other Ambulatory Visit: Payer: Self-pay

## 2020-08-08 ENCOUNTER — Telehealth: Payer: Self-pay | Admitting: Adult Health Nurse Practitioner

## 2020-08-08 VITALS — BP 152/85 | HR 86 | Temp 98.6°F | Resp 18 | Ht 63.0 in | Wt 183.4 lb

## 2020-08-08 DIAGNOSIS — E860 Dehydration: Secondary | ICD-10-CM

## 2020-08-08 DIAGNOSIS — C250 Malignant neoplasm of head of pancreas: Secondary | ICD-10-CM

## 2020-08-08 DIAGNOSIS — Z452 Encounter for adjustment and management of vascular access device: Secondary | ICD-10-CM | POA: Diagnosis not present

## 2020-08-08 DIAGNOSIS — G620 Drug-induced polyneuropathy: Secondary | ICD-10-CM | POA: Diagnosis not present

## 2020-08-08 DIAGNOSIS — G629 Polyneuropathy, unspecified: Secondary | ICD-10-CM

## 2020-08-08 DIAGNOSIS — E876 Hypokalemia: Secondary | ICD-10-CM | POA: Diagnosis not present

## 2020-08-08 DIAGNOSIS — E46 Unspecified protein-calorie malnutrition: Secondary | ICD-10-CM | POA: Diagnosis not present

## 2020-08-08 DIAGNOSIS — I10 Essential (primary) hypertension: Secondary | ICD-10-CM | POA: Diagnosis not present

## 2020-08-08 DIAGNOSIS — Z95828 Presence of other vascular implants and grafts: Secondary | ICD-10-CM

## 2020-08-08 DIAGNOSIS — D649 Anemia, unspecified: Secondary | ICD-10-CM | POA: Diagnosis not present

## 2020-08-08 DIAGNOSIS — L299 Pruritus, unspecified: Secondary | ICD-10-CM | POA: Diagnosis not present

## 2020-08-08 LAB — CBC WITH DIFFERENTIAL/PLATELET
Abs Immature Granulocytes: 0.01 10*3/uL (ref 0.00–0.07)
Basophils Absolute: 0 10*3/uL (ref 0.0–0.1)
Basophils Relative: 0 %
Eosinophils Absolute: 0.2 10*3/uL (ref 0.0–0.5)
Eosinophils Relative: 3 %
HCT: 26.5 % — ABNORMAL LOW (ref 36.0–46.0)
Hemoglobin: 8.4 g/dL — ABNORMAL LOW (ref 12.0–15.0)
Immature Granulocytes: 0 %
Lymphocytes Relative: 34 %
Lymphs Abs: 1.7 10*3/uL (ref 0.7–4.0)
MCH: 26.2 pg (ref 26.0–34.0)
MCHC: 31.7 g/dL (ref 30.0–36.0)
MCV: 82.6 fL (ref 80.0–100.0)
Monocytes Absolute: 0.4 10*3/uL (ref 0.1–1.0)
Monocytes Relative: 8 %
Neutro Abs: 2.7 10*3/uL (ref 1.7–7.7)
Neutrophils Relative %: 55 %
Platelets: 335 10*3/uL (ref 150–400)
RBC: 3.21 MIL/uL — ABNORMAL LOW (ref 3.87–5.11)
RDW: 15.7 % — ABNORMAL HIGH (ref 11.5–15.5)
WBC: 4.9 10*3/uL (ref 4.0–10.5)
nRBC: 0 % (ref 0.0–0.2)

## 2020-08-08 LAB — COMPREHENSIVE METABOLIC PANEL
ALT: 14 U/L (ref 0–44)
AST: 24 U/L (ref 15–41)
Albumin: 3.5 g/dL (ref 3.5–5.0)
Alkaline Phosphatase: 130 U/L — ABNORMAL HIGH (ref 38–126)
Anion gap: 9 (ref 5–15)
BUN: 6 mg/dL — ABNORMAL LOW (ref 8–23)
CO2: 26 mmol/L (ref 22–32)
Calcium: 9 mg/dL (ref 8.9–10.3)
Chloride: 101 mmol/L (ref 98–111)
Creatinine, Ser: 0.73 mg/dL (ref 0.44–1.00)
GFR, Estimated: >60 mL/min (ref >60)
Glucose, Bld: 124 mg/dL — ABNORMAL HIGH (ref 70–99)
Potassium: 3.2 mmol/L — ABNORMAL LOW (ref 3.5–5.1)
Sodium: 136 mmol/L (ref 135–145)
Total Bilirubin: 0.3 mg/dL (ref 0.3–1.2)
Total Protein: 8.1 g/dL (ref 6.5–8.1)

## 2020-08-08 MED ORDER — GABAPENTIN 100 MG PO CAPS: 100.0000 mg | ORAL_CAPSULE | Freq: Three times a day (TID) | ORAL | 2 refills | Status: DC

## 2020-08-08 MED ORDER — SODIUM CHLORIDE 0.9 % IV SOLN
INTRAVENOUS | Status: DC
  Filled 2020-08-08 (×2): qty 250

## 2020-08-08 MED ORDER — SODIUM CHLORIDE 0.9% FLUSH
10.0000 mL | Freq: Once | INTRAVENOUS | Status: AC
Start: 2020-08-08 — End: 2020-08-08
  Administered 2020-08-08: 10 mL via INTRAVENOUS
  Filled 2020-08-08: qty 10

## 2020-08-08 MED ORDER — POTASSIUM CHLORIDE 20 MEQ/100ML IV SOLN
20.0000 meq | Freq: Once | INTRAVENOUS | Status: AC
  Administered 2020-08-08: 20 meq via INTRAVENOUS

## 2020-08-08 MED ORDER — HEPARIN SOD (PORK) LOCK FLUSH 100 UNIT/ML IV SOLN
500.0000 [IU] | Freq: Once | INTRAVENOUS | Status: AC
Start: 2020-08-08 — End: 2020-08-08
  Administered 2020-08-08: 500 [IU]
  Filled 2020-08-08: qty 5

## 2020-08-08 NOTE — Telephone Encounter (Signed)
Attempted to contact patient to schedule a Palliative Consult, no answer - left message with reason for call along with my name and call back number.

## 2020-08-08 NOTE — Progress Notes (Signed)
Symptom Management Consult note [facility]  Telephone:([phone] [phone]  Patient Care Team: [place] as PCP - General [name], [place], [place] (Family Medicine) Rico Junker, RN as Registered Nurse Theodore Demark, RN as Registered Nurse Clent Jacks, RN as Oncology Nurse Navigator Stark Klein, MD as Consulting Physician (General Surgery)   Name of the patient: Robin Daniels  [id number]  1955-09-16   Date of visit: 08/08/2020   Diagnosis- 1. Cancer of head of pancreas ([place]) - CBC with Differential - Comprehensive metabolic panel - Cancer antigen 19-9   Chief complaint/ Reason for visit- dehydration//malnutrition   Heme/Onc history: Mrs. Caroll is a 65 year old female with past medical history significant for migraines, hypertension, plantar fasciitis, anxiety, hyperlipidemia, anemia, heart palpitations who was recently diagnosed with pancreatic cancer.  She received 10 cycles of FOLFIRINOX from 11/15/19-05/15/20.  She is status post Whipple with Dr. Barry Dienes (07/15/20).  Appears to have tolerated surgery well.  She had an J-tube placed prior to surgery to help with nutrition which unfortunately became clogged prior to discharge from the hospital.  She had that replaced last week.  She is able to eat foods by mouth.  She is not currently using her J-tube.  Interval history-Today, patient presents to [facility] for concerns of dehydration and neuropathy.  She missed an appointment this past week due to the inability to drive to the clinic because she could "not feel her feet".  Symptoms have been present for several months but appear to be worsening or more pronounced.  She is currently not taking any medications for neuropathy.  States she is unable to really feel anything to pick it up and feels like she has " frostbite in both her feet".  She denies any falls or problems with ambulation.  She is able to eat and drink by mouth.  She  has not used her PEG tube in over a week due to intolerance.  She states when she started the tube feeds, she immediately became nauseated and vomited.  She is followed by our dietitian here at the cancer center and has a visit scheduled for today.  Her weight appears to be stable.  She denies any nausea, vomiting or diarrhea.  States she eats "when she wants to".  She does drink supplements and is able to get a couple of those in per day.  ECOG FS:1 - Symptomatic but completely ambulatory  Review of systems- Review of Systems  Constitutional: Negative.  Negative for chills, fever, malaise/fatigue and weight loss.  HENT: Negative for congestion, ear pain and tinnitus.   Eyes: Negative.  Negative for blurred vision and double vision.  Respiratory: Negative.  Negative for cough, sputum production and shortness of breath.   Cardiovascular: Negative.  Negative for chest pain, palpitations and leg swelling.  Gastrointestinal: Positive for nausea and vomiting. Negative for abdominal pain, constipation and diarrhea.  Genitourinary: Negative for dysuria, frequency and urgency.  Musculoskeletal: Negative for back pain and falls.  Skin: Negative.  Negative for rash.  Neurological: Positive for tingling and weakness. Negative for headaches.  Endo/Heme/Allergies: Negative.  Does not bruise/bleed easily.  Psychiatric/Behavioral: Negative for depression. The patient has insomnia. The patient is not nervous/anxious.      Current treatment- s/p Whipple on 07/15/2020.  She received 10 cycles of FOLFIRINOX and plan is to continue 2 additional cycles when she heals from her surgery.  No Known Allergies   Past Medical History:  Diagnosis Date  .  Anemia    history of  . Anxiety 09/11/2014  . Arthritis   . Cancer of head of pancreas ([place]) 12/04/2019  . Complication of anesthesia   . Fluttering heart   . Hyperlipidemia   . Hypertension   . PONV (postoperative nausea and vomiting)   . Pre-diabetes       Past Surgical History:  Procedure Laterality Date  . ENDOSCOPIC RETROGRADE CHOLANGIOPANCREATOGRAPHY (ERCP) WITH PROPOFOL N/A 11/16/2019   Procedure: ENDOSCOPIC RETROGRADE CHOLANGIOPANCREATOGRAPHY (ERCP) WITH PROPOFOL;  Surgeon: Lucilla Lame, MD;  Location: [facility] ENDOSCOPY;  Service: Endoscopy;  Laterality: N/A;  . ERCP N/A 03/27/2020   Procedure: ENDOSCOPIC RETROGRADE CHOLANGIOPANCREATOGRAPHY (ERCP);  Surgeon: Lucilla Lame, MD;  Location: [facility] ENDOSCOPY;  Service: Endoscopy;  Laterality: N/A;  . EUS N/A 11/29/2019   Procedure: FULL UPPER ENDOSCOPIC ULTRASOUND (EUS) RADIAL;  Surgeon: Reita Cliche, MD;  Location: [facility] ENDOSCOPY;  Service: Gastroenterology;  Laterality: N/A;  . IR FLUORO RM 30-60 MIN  07/30/2020  . IR IMAGING GUIDED PORT INSERTION  12/14/2019  . JEJUNOSTOMY Left 07/15/2020   Procedure: JEJUNOSTOMY;  Surgeon: Stark Klein, MD;  Location: [place];  Service: General;  Laterality: Left;  . LAPAROSCOPY N/A 07/15/2020   Procedure: DIAGNOSTIC LAPAROSCOPY;  Surgeon: Stark Klein, MD;  Location: [place];  Service: General;  Laterality: N/A;  . right knee replacement Right [id number]  . SHOULDER ARTHROSCOPY W/ ROTATOR CUFF REPAIR Right   . WHIPPLE PROCEDURE N/A 07/15/2020   Procedure: WHIPPLE PROCEDURE;  Surgeon: Stark Klein, MD;  Location: [place];  Service: General;  Laterality: N/A;    Social History   Socioeconomic History  . Marital status: Divorced    Spouse name: Not on file  . Number of children: Not on file  . Years of education: Not on file  . Highest education level: Not on file  Occupational History  . Not on file  Tobacco Use  . Smoking status: Never Smoker  . Smokeless tobacco: Never Used  Vaping Use  . Vaping Use: Never used  Substance and Sexual Activity  . Alcohol use: No    Alcohol/week: 0.0 standard drinks  . Drug use: No  . Sexual activity: Never  Other Topics Concern  . Not on file  Social History Narrative   Lives in [place]; with mom and son;  worked in dietary; never smoked; no alcohol.    Social Determinants of Health   Financial Resource Strain: Not on file  Food Insecurity: Not on file  Transportation Needs: Not on file  Physical Activity: Not on file  Stress: Not on file  Social Connections: Not on file  Intimate Partner Violence: Not on file    Family History  Problem Relation Age of Onset  . Diabetes Mother   . Hyperlipidemia Mother   . Hypertension Mother   . Vision loss Mother   . Arthritis Father   . Hyperlipidemia Father   . Hypertension Father   . Breast cancer Maternal Aunt      Current Outpatient Medications:  .  amLODipine (NORVASC) 10 MG tablet, Take 1 tablet (10 mg total) by mouth daily., Disp: 30 tablet, Rfl: 0 .  feeding supplement (ENSURE ENLIVE / ENSURE PLUS) LIQD, Take 237 mLs by mouth 2 (two) times daily between meals., Disp: 237 mL, Rfl: 12 .  gabapentin (NEURONTIN) 100 MG capsule, Take 1 capsule (100 mg total) by mouth 3 (three) times daily., Disp: 90 capsule, Rfl: 2 .  metoprolol succinate (TOPROL-XL) 50 MG 24 hr tablet, Take 1 tablet (50  mg total) by mouth daily. Take with or immediately following a meal., Disp: 30 tablet, Rfl: 0 .  ondansetron (ZOFRAN-ODT) 4 MG disintegrating tablet, Take 1 tablet (4 mg total) by mouth every 6 (six) hours as needed for nausea., Disp: 20 tablet, Rfl: 0 .  Potassium Chloride ER 20 MEQ TBCR, Take 20 mEq by mouth in the morning and at bedtime., Disp: 60 tablet, Rfl: 1 .  acetaminophen (TYLENOL) 325 MG tablet, Take 650 mg by mouth every 6 (six) hours as needed for moderate pain. (Patient not taking: Reported on 08/08/2020), Disp: , Rfl:  .  diphenoxylate-atropine (LOMOTIL) 2.5-0.025 MG tablet, Take 1 tablet by mouth 4 (four) times daily as needed for diarrhea or loose stools. Take it along with immodium (Patient not taking: Reported on 08/08/2020), Disp: 45 tablet, Rfl: 0 .  lidocaine-prilocaine (EMLA) cream, Apply 1 application topically daily as needed (numbing).  (Patient not taking: Reported on 08/08/2020), Disp: , Rfl:  .  methocarbamol (ROBAXIN) 500 MG tablet, Take 1 tablet (500 mg total) by mouth every 8 (eight) hours as needed for muscle spasms. (Patient not taking: Reported on 08/08/2020), Disp: 10 tablet, Rfl: 1 .  oxyCODONE (OXY IR/ROXICODONE) 5 MG immediate release tablet, Take 1 tablet (5 mg total) by mouth every 4 (four) hours as needed for moderate pain. (Patient not taking: Reported on 08/08/2020), Disp: 5 tablet, Rfl: 0 .  pantoprazole (PROTONIX) 40 MG tablet, Take 1 tablet (40 mg total) by mouth at bedtime. (Patient not taking: Reported on 08/08/2020), Disp: 30 tablet, Rfl: 2 .  vitamin B-12 (CYANOCOBALAMIN) 1000 MCG tablet, Take 1 tablet (1,000 mcg total) by mouth daily., Disp: , Rfl:   Physical exam:  Vitals:   08/08/20 0900  BP: (!) 152/85  Pulse: 86  Resp: 18  Temp: 98.6 F (37 C)  TempSrc: Tympanic  SpO2: 100%  Weight: 183 lb 6.4 oz (83.2 kg)  Height: 5\' 3"  (1.6 m)   Physical Exam Constitutional:      Appearance: Normal appearance.  HENT:     Head: Normocephalic and atraumatic.  Eyes:     Pupils: Pupils are equal, round, and reactive to light.  Cardiovascular:     Rate and Rhythm: Normal rate and regular rhythm.     Heart sounds: Normal heart sounds. No murmur heard.   Pulmonary:     Effort: Pulmonary effort is normal.     Breath sounds: Normal breath sounds. No wheezing.  Abdominal:     General: Bowel sounds are normal. There is no distension.     Palpations: Abdomen is soft.     Tenderness: There is no abdominal tenderness.  Musculoskeletal:        General: Normal range of motion.     Cervical back: Normal range of motion.  Skin:    General: Skin is warm and dry.     Findings: No rash.  Neurological:     Mental Status: She is alert and oriented to person, place, and time.  Psychiatric:        Judgment: Judgment normal.      CMP Latest Ref Rng & Units 08/08/2020  Glucose 70 - 99 mg/dL 124(H)  BUN 8 - 23  mg/dL 6(L)  Creatinine 0.44 - 1.00 mg/dL 0.73  Sodium 135 - 145 mmol/L 136  Potassium 3.5 - 5.1 mmol/L 3.2(L)  Chloride 98 - 111 mmol/L 101  CO2 22 - 32 mmol/L 26  Calcium 8.9 - 10.3 mg/dL 9.0  Total Protein 6.5 - 8.1 g/dL 8.1  Total Bilirubin 0.3 - 1.2 mg/dL 0.3  Alkaline Phos 38 - 126 U/L 130(H)  AST 15 - 41 U/L 24  ALT 0 - 44 U/L 14   CBC Latest Ref Rng & Units 08/08/2020  WBC 4.0 - 10.5 K/uL 4.9  Hemoglobin 12.0 - 15.0 g/dL 8.4(L)  Hematocrit 36.0 - 46.0 % 26.5(L)  Platelets 150 - 400 K/uL 335    No images are attached to the encounter.  IR Fluoro Rm 30-60 Min  Result Date: 07/30/2020 INDICATION: 65 year old female with a history of pancreatic cancer status post Whipple with a surgically placed percutaneous jejunostomy tube which was placed approximately 14 days ago. The tube is partially clogged. Patient presents for attempted unclogging versus exchange over wire. EXAM: IR FLUORO RM 0-60 MIN MEDICATIONS: None. ANESTHESIA/SEDATION: None. CONTRAST:  57mL OMNIPAQUE IOHEXOL 300 MG/ML SOLN - administered into the gastric lumen. FLUOROSCOPY TIME:  Fluoroscopy Time: 1 minutes 30 seconds (48 mGy). COMPLICATIONS: None immediate. PROCEDURE: Informed written consent was obtained from the patient after a thorough discussion of the procedural risks, benefits and alternatives. All questions were addressed. Maximal Sterile Barrier Technique was utilized including caps, mask, sterile gowns, sterile gloves, sterile drape, hand hygiene and skin antiseptic. A timeout was performed prior to the initiation of the procedure. Initial contrast injection demonstrates a very difficult to inject tube. There is an oblong filling defect in the distal tube consistent with a partial obstruction. Numerous efforts were made to declot the tube including forceful saline injection, installation of coca-cola, and use of the unclogger device. Fluoroscopy was used intermittently to assess the positioning of the tube and  obstruction with hand injections of contrast material. Ultimately, the obstruction was removed during a forceful injection of saline. Follow-up digital subtraction angiography with contrast injection demonstrates a well-positioned percutaneous jejunostomy tube and no further evidence of obstruction. IMPRESSION: Successful unclogging of surgically placed jejunostomy tube with fluoroscopic guidance. Electronically Signed   By: Jacqulynn Cadet M.D.   On: 07/30/2020 13:41   DG Chest Port 1 View  Result Date: 07/15/2020 CLINICAL DATA:  Central line placement EXAM: PORTABLE CHEST 1 VIEW COMPARISON:  09/08/2019 FINDINGS: Interval placement of right jugular Port-A-Cath with tip in the mid SVC. No pneumothorax Interval placement of left jugular central venous catheter with the tip in the SVC. No pneumothorax NG tube in the stomach  . Cardiac and mediastinal contours normal. Lungs clear without infiltrate or effusion. IMPRESSION: Satisfactory Port-A-Cath placement Satisfactory central line placement NG tube in the stomach. Electronically Signed   By: Franchot Gallo M.D.   On: 07/15/2020 15:59     Assessment and plan- Patient is a 65 y.o. female who presents today for follow-up and to assess labs.   Pancreatic cancer: -She is status post 10 cycles of FOLFIRINOX with plans to complete 2 additional cycles when she recovers from surgery. -She is status post Whipple with Dr. Barry Dienes on 07/15/2020. -Appears to have tolerated surgery well. -Had J-tube placed to help with nutrition but unfortunately became clogged after surgery. -She is able to tolerate food by mouth at this time.  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 -Currently not taking any medications. -Recommend starting on gabapentin 100 mg 3 times daily but initially starting 1 tablet only at bedtime and can increased as able to tolerate.  -We will see if this improves her neuropathy and will adjust accordingly. -Also recommend she try acupuncture.   Referral sent to [facility] to get this arranged.  Dehydration/electrolyte derangement: -Labs from 08/08/2020 show potassium of 3.2 and  hemoglobin of 8.4.  The rest of her blood counts are normal. -Proceed with 1 L NaCl +20 mEq potassium through her IV. -Recommend she push potassium rich foods. -She is also meeting with our dietitian after our visit today.  Disposition: -RTC in 2 weeks with repeat lab work (CBC, CMP) plus or minus IV fluids and to discuss with Dr. Rogue Bussing treatment moving forward. -She will also need a visit with Zachary George our dietitian -Start gabapentin 100 mg 3 times daily.   Visit Diagnosis 1. Dehydration   2. Cancer of head of pancreas ([place])   3. Hypokalemia   4. Neuropathy     Patient expressed understanding and was in agreement with this plan. She also understands that She can call clinic at any time with any questions, concerns, or complaints.   Greater than 50% was spent in counseling and coordination of care with this patient including but not limited to discussion of the relevant topics above (See A&P) including, but not limited to diagnosis and management of acute and chronic medical conditions.   Thank you for allowing me to participate in the care of this very pleasant patient.    Jacquelin Hawking, NP [place] at [facility] Cell - [phone] Pager- [phone] 08/08/2020 1:59 PM

## 2020-08-08 NOTE — Progress Notes (Signed)
Nutrition Follow-up:  Patient with pancreatic cancer, s/p neoadjuvant chemotherapy and whipple on 4/19 with J-tube.  J-tube replaced on 5/4.    Met with patient following [facility] visit.  Patient reports that she is eating.  Ate 2 pieces of jelly toast for breakfast yesterday.  Ate baked potato and 1/2 fish stick for dinner last night.  Likes the boost plus strawberry shake the best and the strawberry kiwi boost soothe. Patient does not drink oral nutrition supplement shakes daily.  Dinks water, gingerale and pepsi.  Denies GI issues with oral intake.    Reports on Friday, 5/6 started J-tube feeding at 50 ml/hr at 4:50pm and about 7 pm started getting nauseated and vomited so she turned tube feeding off.  "I can't handle that stuff."  It was awful." Patient did not take nausea medications. Has been flushing J-tube BID while not in use.      Medications: reviewed  Labs: K 3.2 (supplemented), glucose 124  Anthropometrics:   Weight 183 lb 6.4 oz today stable  183 lb 8 oz on 4/29  186 lb 3/29 prior to hospitalization   NUTRITION DIAGNOSIS: Inadequate oral intake continues   INTERVENTION:  Discussed with patient that current oral intake is not enough to meet calorie and protein needs.  Encouraged patient to consider starting J-tube feeding at 25m/hr but taking nausea medication at least 30 minutes prior to starting feeding. If tolerating at 25ml/hr day1, increase by 10ml daily until at 50ml/hr.   Patient wants to increase oral intake.  Discussed importance of protein food at every meal.  Drink at least 3 shakes per day (high calorie ones). More samples given today.   Patient knows to flush J-tube daily if not using for tube feeding     MONITORING, EVALUATION, GOAL: weight trends, intake, ?? Tube feeding   NEXT VISIT: Friday, May 27   Joli B. Allen, RD, LDN Registered Dietitian [phone] (mobile)     

## 2020-08-09 LAB — CANCER ANTIGEN 19-9: CA 19-9: 11 U/mL (ref 0–35)

## 2020-08-11 ENCOUNTER — Telehealth: Payer: Self-pay | Admitting: *Deleted

## 2020-08-11 NOTE — Telephone Encounter (Signed)
Attempted to reach patient to provide the ca 19-9 results. Patient not at home. Results are normal.  Will attempt to reach patient at a later time.

## 2020-08-13 ENCOUNTER — Telehealth: Payer: Self-pay | Admitting: Adult Health Nurse Practitioner

## 2020-08-13 LAB — FUNGAL ORGANISM REFLEX

## 2020-08-13 LAB — FUNGUS CULTURE RESULT

## 2020-08-13 LAB — FUNGUS CULTURE WITH STAIN

## 2020-08-13 NOTE — Telephone Encounter (Signed)
Attempted to contact patient to schedule a Palliative Consult, no answer - left message with reason for call and also offered to answer any questions regarding Palliative services that she may have.  Left my name and call back number.

## 2020-08-18 ENCOUNTER — Telehealth: Payer: Self-pay | Admitting: Adult Health Nurse Practitioner

## 2020-08-18 NOTE — Telephone Encounter (Signed)
Spoke with patient regarding the Palliative referral and after explaining why I was calling she stated that a nurse was already coming and found out that it was [facility] coming out. I tried to explain to her the difference betwee home health and Palliative services.   Patient seemed surprised about the referral and said that this was not discussed with her at the [facility] and she stated that she was doing fine and doing her own thing and she didn't feel like she needed all this these services.

## 2020-08-22 ENCOUNTER — Inpatient Hospital Stay: Payer: Medicare Other

## 2020-08-22 ENCOUNTER — Other Ambulatory Visit: Payer: Self-pay

## 2020-08-22 ENCOUNTER — Inpatient Hospital Stay (HOSPITAL_BASED_OUTPATIENT_CLINIC_OR_DEPARTMENT_OTHER): Payer: Medicare Other | Admitting: Oncology

## 2020-08-22 VITALS — BP 153/78 | HR 82 | Temp 96.0°F | Resp 16 | Wt 171.0 lb

## 2020-08-22 DIAGNOSIS — C250 Malignant neoplasm of head of pancreas: Secondary | ICD-10-CM

## 2020-08-22 DIAGNOSIS — G629 Polyneuropathy, unspecified: Secondary | ICD-10-CM

## 2020-08-22 DIAGNOSIS — D649 Anemia, unspecified: Secondary | ICD-10-CM | POA: Diagnosis not present

## 2020-08-22 DIAGNOSIS — E876 Hypokalemia: Secondary | ICD-10-CM

## 2020-08-22 DIAGNOSIS — E860 Dehydration: Secondary | ICD-10-CM

## 2020-08-22 LAB — CBC WITH DIFFERENTIAL/PLATELET
Abs Immature Granulocytes: 0 10*3/uL (ref 0.00–0.07)
Basophils Absolute: 0 10*3/uL (ref 0.0–0.1)
Basophils Relative: 0 %
Eosinophils Absolute: 0.1 10*3/uL (ref 0.0–0.5)
Eosinophils Relative: 2 %
HCT: 27.2 % — ABNORMAL LOW (ref 36.0–46.0)
Hemoglobin: 8.5 g/dL — ABNORMAL LOW (ref 12.0–15.0)
Immature Granulocytes: 0 %
Lymphocytes Relative: 45 %
Lymphs Abs: 1.7 10*3/uL (ref 0.7–4.0)
MCH: 25.1 pg — ABNORMAL LOW (ref 26.0–34.0)
MCHC: 31.3 g/dL (ref 30.0–36.0)
MCV: 80.5 fL (ref 80.0–100.0)
Monocytes Absolute: 0.3 10*3/uL (ref 0.1–1.0)
Monocytes Relative: 8 %
Neutro Abs: 1.7 10*3/uL (ref 1.7–7.7)
Neutrophils Relative %: 45 %
Platelets: 255 10*3/uL (ref 150–400)
RBC: 3.38 MIL/uL — ABNORMAL LOW (ref 3.87–5.11)
RDW: 15.9 % — ABNORMAL HIGH (ref 11.5–15.5)
WBC: 3.8 10*3/uL — ABNORMAL LOW (ref 4.0–10.5)
nRBC: 0 % (ref 0.0–0.2)

## 2020-08-22 LAB — COMPREHENSIVE METABOLIC PANEL
ALT: 20 U/L (ref 0–44)
AST: 30 U/L (ref 15–41)
Albumin: 3.6 g/dL (ref 3.5–5.0)
Alkaline Phosphatase: 139 U/L — ABNORMAL HIGH (ref 38–126)
Anion gap: 12 (ref 5–15)
BUN: 8 mg/dL (ref 8–23)
CO2: 24 mmol/L (ref 22–32)
Calcium: 9 mg/dL (ref 8.9–10.3)
Chloride: 102 mmol/L (ref 98–111)
Creatinine, Ser: 0.71 mg/dL (ref 0.44–1.00)
GFR, Estimated: >60 mL/min (ref >60)
Glucose, Bld: 125 mg/dL — ABNORMAL HIGH (ref 70–99)
Potassium: 3.4 mmol/L — ABNORMAL LOW (ref 3.5–5.1)
Sodium: 138 mmol/L (ref 135–145)
Total Bilirubin: 0.6 mg/dL (ref 0.3–1.2)
Total Protein: 7.5 g/dL (ref 6.5–8.1)

## 2020-08-22 MED ORDER — SODIUM CHLORIDE 0.9% FLUSH
3.0000 mL | Freq: Once | INTRAVENOUS | Status: DC | PRN
  Filled 2020-08-22: qty 3

## 2020-08-22 MED ORDER — DULOXETINE HCL 20 MG PO CPEP: 20.0000 mg | ORAL_CAPSULE | Freq: Every day | ORAL | 3 refills | Status: DC

## 2020-08-22 MED ORDER — HEPARIN SOD (PORK) LOCK FLUSH 100 UNIT/ML IV SOLN
500.0000 [IU] | Freq: Once | INTRAVENOUS | Status: AC | PRN
  Administered 2020-08-22: 500 [IU]
  Filled 2020-08-22: qty 5

## 2020-08-22 MED ORDER — SODIUM CHLORIDE 0.9% FLUSH
10.0000 mL | Freq: Once | INTRAVENOUS | Status: AC | PRN
  Administered 2020-08-22: 10 mL
  Filled 2020-08-22: qty 10

## 2020-08-22 NOTE — Progress Notes (Signed)
Symptom Management Consult note [facility]  Telephone:([phone] [phone]  Patient Care Team: [place] as PCP - General [name], [place], [place] (Family Medicine) Rico Junker, RN as Registered Nurse Theodore Demark, RN as Registered Nurse Clent Jacks, RN as Oncology Nurse Navigator Stark Klein, MD as Consulting Physician (General Surgery)   Name of the patient: Robin Daniels  [id number]  December 19, 1955   Date of visit: 08/22/2020   Diagnosis- 1. Cancer of head of pancreas ([place]) - CBC with Differential - Comprehensive metabolic panel - Cancer antigen 19-9   Chief complaint/ Reason for visit- dehydration  Heme/Onc history: Robin Daniels is a 65 year old female with past medical history significant for migraines, hypertension, plantar fasciitis, anxiety, hyperlipidemia, anemia, heart palpitations who was recently diagnosed with pancreatic cancer.  She received 10 cycles of FOLFIRINOX from 11/15/19-05/15/20.  She is status post Whipple with Dr. Barry Dienes (07/15/20).  Appears to have tolerated surgery well.  She had an J-tube placed prior to surgery to help with nutrition which unfortunately became clogged prior to discharge from the hospital.  She had that replaced a few weeks ago week.  She is able to eat foods by mouth.  She is not currently using her J-tube.  She is seen frequently by our dietitian to make sure she is getting adequate intake.  She was approximately 2 weeks ago for IV fluids and follow-up post Whipple surgery.  She had minimal weight loss (about 3 pounds) and was eating fairly well.  Interval history-today, she presents for follow-up and to assess lab work.  She reports doing well.  She was previously given gabapentin to help with upper and lower extremity neuropathy but states the medication made her vision "blurry".  She took 1 dose and has not taken any since.  Her neuropathy is still bothering her and her feet  feel "swollen".  She is not using her PEG tube.  She is having normal daily bowel movements.  She rest when she feels tired.  She takes naps occasionally throughout the day.  Reports occasional "itching spells".  She was wondering if it was okay for her to take Benadryl.  ECOG FS:1 - Symptomatic but completely ambulatory  Review of systems- Review of Systems  Constitutional: Positive for malaise/fatigue and weight loss.  Skin: Positive for itching.  Neurological: Positive for sensory change and weakness.  Psychiatric/Behavioral: The patient has insomnia.      Current treatment- s/p Whipple on 07/15/2020.  She received 10 cycles of FOLFIRINOX and plan is to continue 2 additional cycles when she heals from her surgery.  No Known Allergies   Past Medical History:  Diagnosis Date  . Anemia    history of  . Anxiety 09/11/2014  . Arthritis   . Cancer of head of pancreas ([place]) 12/04/2019  . Complication of anesthesia   . Fluttering heart   . Hyperlipidemia   . Hypertension   . PONV (postoperative nausea and vomiting)   . Pre-diabetes      Past Surgical History:  Procedure Laterality Date  . ENDOSCOPIC RETROGRADE CHOLANGIOPANCREATOGRAPHY (ERCP) WITH PROPOFOL N/A 11/16/2019   Procedure: ENDOSCOPIC RETROGRADE CHOLANGIOPANCREATOGRAPHY (ERCP) WITH PROPOFOL;  Surgeon: Lucilla Lame, MD;  Location: [facility] ENDOSCOPY;  Service: Endoscopy;  Laterality: N/A;  . ERCP N/A 03/27/2020   Procedure: ENDOSCOPIC RETROGRADE CHOLANGIOPANCREATOGRAPHY (ERCP);  Surgeon: Lucilla Lame, MD;  Location: [facility] ENDOSCOPY;  Service: Endoscopy;  Laterality: N/A;  . EUS N/A 11/29/2019   Procedure: FULL UPPER ENDOSCOPIC  ULTRASOUND (EUS) RADIAL;  Surgeon: Reita Cliche, MD;  Location: [facility] ENDOSCOPY;  Service: Gastroenterology;  Laterality: N/A;  . IR FLUORO RM 30-60 MIN  07/30/2020  . IR IMAGING GUIDED PORT INSERTION  12/14/2019  . JEJUNOSTOMY Left 07/15/2020   Procedure: JEJUNOSTOMY;  Surgeon: Stark Klein, MD;  Location: [place];  Service: General;  Laterality: Left;  . LAPAROSCOPY N/A 07/15/2020   Procedure: DIAGNOSTIC LAPAROSCOPY;  Surgeon: Stark Klein, MD;  Location: [place];  Service: General;  Laterality: N/A;  . right knee replacement Right [id number]  . SHOULDER ARTHROSCOPY W/ ROTATOR CUFF REPAIR Right   . WHIPPLE PROCEDURE N/A 07/15/2020   Procedure: WHIPPLE PROCEDURE;  Surgeon: Stark Klein, MD;  Location: [place];  Service: General;  Laterality: N/A;    Social History   Socioeconomic History  . Marital status: Divorced    Spouse name: Not on file  . Number of children: Not on file  . Years of education: Not on file  . Highest education level: Not on file  Occupational History  . Not on file  Tobacco Use  . Smoking status: Never Smoker  . Smokeless tobacco: Never Used  Vaping Use  . Vaping Use: Never used  Substance and Sexual Activity  . Alcohol use: No    Alcohol/week: 0.0 standard drinks  . Drug use: No  . Sexual activity: Never  Other Topics Concern  . Not on file  Social History Narrative   Lives in [place]; with mom and son; worked in dietary; never smoked; no alcohol.    Social Determinants of Health   Financial Resource Strain: Not on file  Food Insecurity: Not on file  Transportation Needs: Not on file  Physical Activity: Not on file  Stress: Not on file  Social Connections: Not on file  Intimate Partner Violence: Not on file    Family History  Problem Relation Age of Onset  . Diabetes Mother   . Hyperlipidemia Mother   . Hypertension Mother   . Vision loss Mother   . Arthritis Father   . Hyperlipidemia Father   . Hypertension Father   . Breast cancer Maternal Aunt      Current Outpatient Medications:  .  amLODipine (NORVASC) 10 MG tablet, Take 1 tablet (10 mg total) by mouth daily., Disp: 30 tablet, Rfl: 0 .  DULoxetine (CYMBALTA) 20 MG capsule, Take 1 capsule (20 mg total) by mouth daily., Disp: 30 capsule, Rfl: 3 .  feeding supplement (ENSURE ENLIVE / ENSURE  PLUS) LIQD, Take 237 mLs by mouth 2 (two) times daily between meals., Disp: 237 mL, Rfl: 12 .  lidocaine-prilocaine (EMLA) cream, Apply 1 application topically daily as needed (numbing)., Disp: , Rfl:  .  metoprolol succinate (TOPROL-XL) 50 MG 24 hr tablet, Take 1 tablet (50 mg total) by mouth daily. Take with or immediately following a meal., Disp: 30 tablet, Rfl: 0 .  pantoprazole (PROTONIX) 40 MG tablet, Take 1 tablet (40 mg total) by mouth at bedtime., Disp: 30 tablet, Rfl: 2 .  Potassium Chloride ER 20 MEQ TBCR, Take 20 mEq by mouth in the morning and at bedtime., Disp: 60 tablet, Rfl: 1 .  vitamin B-12 (CYANOCOBALAMIN) 1000 MCG tablet, Take 1 tablet (1,000 mcg total) by mouth daily., Disp: , Rfl:  .  acetaminophen (TYLENOL) 325 MG tablet, Take 650 mg by mouth every 6 (six) hours as needed for moderate pain. (Patient not taking: No sig reported), Disp: , Rfl:  .  diphenoxylate-atropine (LOMOTIL) 2.5-0.025 MG tablet, Take  1 tablet by mouth 4 (four) times daily as needed for diarrhea or loose stools. Take it along with immodium (Patient not taking: No sig reported), Disp: 45 tablet, Rfl: 0 .  gabapentin (NEURONTIN) 100 MG capsule, Take 1 capsule (100 mg total) by mouth 3 (three) times daily. (Patient not taking: Reported on 08/22/2020), Disp: 90 capsule, Rfl: 2 .  methocarbamol (ROBAXIN) 500 MG tablet, Take 1 tablet (500 mg total) by mouth every 8 (eight) hours as needed for muscle spasms. (Patient not taking: No sig reported), Disp: 10 tablet, Rfl: 1 .  ondansetron (ZOFRAN-ODT) 4 MG disintegrating tablet, Take 1 tablet (4 mg total) by mouth every 6 (six) hours as needed for nausea. (Patient not taking: Reported on 08/22/2020), Disp: 20 tablet, Rfl: 0 .  oxyCODONE (OXY IR/ROXICODONE) 5 MG immediate release tablet, Take 1 tablet (5 mg total) by mouth every 4 (four) hours as needed for moderate pain. (Patient not taking: No sig reported), Disp: 5 tablet, Rfl: 0 No current facility-administered medications  for this visit.  Facility-Administered Medications Ordered in Other Visits:  .  sodium chloride flush (NS) 0.9 % injection 3 mL, 3 mL, Intracatheter, Once PRN, Cammie Sickle, MD  Physical exam:  Vitals:   08/22/20 0856  BP: (!) 153/78  Pulse: 82  Resp: 16  Temp: (!) 96 F (35.6 C)  TempSrc: Tympanic  SpO2: 100%  Weight: 171 lb (77.6 kg)   Physical Exam Constitutional:      Appearance: Normal appearance.  HENT:     Head: Normocephalic and atraumatic.  Eyes:     Pupils: Pupils are equal, round, and reactive to light.  Cardiovascular:     Rate and Rhythm: Normal rate and regular rhythm.     Heart sounds: Normal heart sounds. No murmur heard.   Pulmonary:     Effort: Pulmonary effort is normal.     Breath sounds: Normal breath sounds. No wheezing.  Abdominal:     General: Bowel sounds are normal. There is no distension.     Palpations: Abdomen is soft.     Tenderness: There is no abdominal tenderness.  Musculoskeletal:        General: Normal range of motion.     Cervical back: Normal range of motion.  Skin:    General: Skin is warm and dry.     Findings: No rash.  Neurological:     Mental Status: She is alert and oriented to person, place, and time.  Psychiatric:        Judgment: Judgment normal.      CMP Latest Ref Rng & Units 08/22/2020  Glucose 70 - 99 mg/dL 125(H)  BUN 8 - 23 mg/dL 8  Creatinine 0.44 - 1.00 mg/dL 0.71  Sodium 135 - 145 mmol/L 138  Potassium 3.5 - 5.1 mmol/L 3.4(L)  Chloride 98 - 111 mmol/L 102  CO2 22 - 32 mmol/L 24  Calcium 8.9 - 10.3 mg/dL 9.0  Total Protein 6.5 - 8.1 g/dL 7.5  Total Bilirubin 0.3 - 1.2 mg/dL 0.6  Alkaline Phos 38 - 126 U/L 139(H)  AST 15 - 41 U/L 30  ALT 0 - 44 U/L 20   CBC Latest Ref Rng & Units 08/22/2020  WBC 4.0 - 10.5 K/uL 3.8(L)  Hemoglobin 12.0 - 15.0 g/dL 8.5(L)  Hematocrit 36.0 - 46.0 % 27.2(L)  Platelets 150 - 400 K/uL 255    No images are attached to the encounter.  IR Fluoro Rm 30-60  Min  Result Date: 07/30/2020 INDICATION:  65 year old female with a history of pancreatic cancer status post Whipple with a surgically placed percutaneous jejunostomy tube which was placed approximately 14 days ago. The tube is partially clogged. Patient presents for attempted unclogging versus exchange over wire. EXAM: IR FLUORO RM 0-60 MIN MEDICATIONS: None. ANESTHESIA/SEDATION: None. CONTRAST:  72mL OMNIPAQUE IOHEXOL 300 MG/ML SOLN - administered into the gastric lumen. FLUOROSCOPY TIME:  Fluoroscopy Time: 1 minutes 30 seconds (48 mGy). COMPLICATIONS: None immediate. PROCEDURE: Informed written consent was obtained from the patient after a thorough discussion of the procedural risks, benefits and alternatives. All questions were addressed. Maximal Sterile Barrier Technique was utilized including caps, mask, sterile gowns, sterile gloves, sterile drape, hand hygiene and skin antiseptic. A timeout was performed prior to the initiation of the procedure. Initial contrast injection demonstrates a very difficult to inject tube. There is an oblong filling defect in the distal tube consistent with a partial obstruction. Numerous efforts were made to declot the tube including forceful saline injection, installation of coca-cola, and use of the unclogger device. Fluoroscopy was used intermittently to assess the positioning of the tube and obstruction with hand injections of contrast material. Ultimately, the obstruction was removed during a forceful injection of saline. Follow-up digital subtraction angiography with contrast injection demonstrates a well-positioned percutaneous jejunostomy tube and no further evidence of obstruction. IMPRESSION: Successful unclogging of surgically placed jejunostomy tube with fluoroscopic guidance. Electronically Signed   By: Jacqulynn Cadet M.D.   On: 07/30/2020 13:41     Assessment and plan- Patient is a 65 y.o. female who presents today for follow-up and to assess labs.    Pancreatic cancer: -She is status post 10 cycles of FOLFIRINOX with plans to complete 2 additional cycles when she recovers from surgery. -She is status post Whipple with Dr. Barry Dienes on 07/15/2020. -Appears to have tolerated surgery well. -Had J-tube placed to help with nutrition but unfortunately became clogged after surgery. -She is able to tolerate food by mouth at this time. -Presently is not scheduled for additional chemo.   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 -Unable to tolerate gabapentin-caused blurry vision. -She is hesitant to try acupuncture. -Can try Cymbalta 20 mg daily -Stop gabapentin  -Given her weight loss and possible weight loss with Cymbalta would recommend lowest dose. -Also recommend she try acupuncture.  Referral [facility] to [facility] to get this arranged.  Dehydration/electrolyte derangement: -Labs from 08/21/2020 show potassium of 3.4 and hemoglobin of 8.5.  The rest of her blood counts are normal. -No additional IV fluids needed today. -Continue potassium supplements.  Itching -Recommend OTC Zyrtec and Benadryl at bed if needed.  Weight loss -She is meeting with dietitian today. -Recommend high-protein foods.  Anemia: -We will check nutritional labs at her next visit. - Stable and slowly improving  Disposition: -RTC in 2 weeks with repeat lab work (CBC, CMP, ferritin, iron, vitamin b12) and to discuss with Dr. Rogue Bussing treatment moving forward. -She will also need a visit with Zachary George our dietitian -Start Cymbalta 20 mg daily. -Stop gabapentin.  Visit Diagnosis 1. Cancer of head of pancreas ([place])   2. Dehydration   3. Neuropathy     Patient expressed understanding and was in agreement with this plan. She also understands that She can call clinic at any time with any questions, concerns, or complaints.   Greater than 50% was spent in counseling and coordination of care with this patient including but not limited to discussion of the relevant  topics above (See A&P) including, but not limited to diagnosis and  management of acute and chronic medical conditions.   Thank you for allowing me to participate in the care of this very pleasant patient.    Jacquelin Hawking, NP [place] at [facility] Cell - [phone] Pager- [phone] 08/22/2020 10:37 AM

## 2020-08-22 NOTE — Progress Notes (Signed)
Nutrition Follow-up:  Patient with pancreatic cancer, s/p neoadjuvant chemotherapy and whipple on 4/19 with J-tube placement.  J-tube clogged on discharge but replaced 1 week later (5/4).   Met with patient in clinic prior to NP.  Patient reports that she is eating.  "I know you don't believe me but I am eating."  Says that she ate 2 eggs with cheese yesterday am with toast (~380 calories and 20gm protein).  Lunch was large paper plate of spaghetti (~493 calories and 32 g protein).  Evening meal with cheese and crackers (3 strips of block cheese and unsure how many crackers) (~400 calories and 20 g).  Drinks 2 boost plus shakes per day (strawberry) (720 calories, 28 g protein) Total 2170 calories, 100g protein).    Denies abdominal pain, stomach upset, diarrhea, constipation, nausea.  BM typically each morning, normal.    Has not used J-tube for nutrition and did not want to retry using it for nutrition. Says that she is flushing daily    Medications: reviewed  Labs: reviewed  Anthropometrics:   Weight today 171 lb  Weight 183 lb on 5/13 183 lb on 4/29 186 lb on 3/29 prior to hospitalization  7% weight loss in the last 2 weeks, significant    NUTRITION DIAGNOSIS: Inadequate oral intake continues    INTERVENTION:  Patient does not want to retry using J-tube for nutrition due to experience of nausea, vomiting previously.  RD has offered options for retrying but patient declines. Recommend increase boost plus to TID vs BID.  Patient to make sure she has 360 calorie shake. Reviewed importance of increasing calories and protein in current eating pattern.  Discussed ways to do that.      MONITORING, EVALUATION, GOAL: weight trends, intake   NEXT VISIT: when back in clinic  Robin Daniels, [place], [place] Registered Dietitian [phone] (mobile)

## 2020-08-23 LAB — CANCER ANTIGEN 19-9: CA 19-9: 14 U/mL (ref 0–35)

## 2020-09-05 ENCOUNTER — Inpatient Hospital Stay: Payer: Medicare Other

## 2020-09-05 ENCOUNTER — Encounter: Payer: Self-pay | Admitting: Internal Medicine

## 2020-09-05 ENCOUNTER — Inpatient Hospital Stay: Payer: Medicare Other | Attending: Internal Medicine

## 2020-09-05 ENCOUNTER — Inpatient Hospital Stay (HOSPITAL_BASED_OUTPATIENT_CLINIC_OR_DEPARTMENT_OTHER): Payer: Medicare Other | Admitting: Internal Medicine

## 2020-09-05 VITALS — BP 159/88 | HR 97 | Temp 96.9°F | Resp 16 | Ht 63.0 in | Wt 173.8 lb

## 2020-09-05 DIAGNOSIS — C250 Malignant neoplasm of head of pancreas: Secondary | ICD-10-CM | POA: Insufficient documentation

## 2020-09-05 DIAGNOSIS — E785 Hyperlipidemia, unspecified: Secondary | ICD-10-CM | POA: Diagnosis not present

## 2020-09-05 DIAGNOSIS — D649 Anemia, unspecified: Secondary | ICD-10-CM

## 2020-09-05 DIAGNOSIS — I10 Essential (primary) hypertension: Secondary | ICD-10-CM | POA: Insufficient documentation

## 2020-09-05 LAB — CBC WITH DIFFERENTIAL/PLATELET
Abs Immature Granulocytes: 0.01 10*3/uL (ref 0.00–0.07)
Basophils Absolute: 0 10*3/uL (ref 0.0–0.1)
Basophils Relative: 0 %
Eosinophils Absolute: 0.1 10*3/uL (ref 0.0–0.5)
Eosinophils Relative: 2 %
HCT: 28.6 % — ABNORMAL LOW (ref 36.0–46.0)
Hemoglobin: 9 g/dL — ABNORMAL LOW (ref 12.0–15.0)
Immature Granulocytes: 0 %
Lymphocytes Relative: 38 %
Lymphs Abs: 1.6 10*3/uL (ref 0.7–4.0)
MCH: 25.6 pg — ABNORMAL LOW (ref 26.0–34.0)
MCHC: 31.5 g/dL (ref 30.0–36.0)
MCV: 81.3 fL (ref 80.0–100.0)
Monocytes Absolute: 0.4 10*3/uL (ref 0.1–1.0)
Monocytes Relative: 9 %
Neutro Abs: 2.2 10*3/uL (ref 1.7–7.7)
Neutrophils Relative %: 51 %
Platelets: 267 10*3/uL (ref 150–400)
RBC: 3.52 MIL/uL — ABNORMAL LOW (ref 3.87–5.11)
RDW: 16 % — ABNORMAL HIGH (ref 11.5–15.5)
WBC: 4.3 10*3/uL (ref 4.0–10.5)
nRBC: 0 % (ref 0.0–0.2)

## 2020-09-05 LAB — FERRITIN: Ferritin: 129 ng/mL (ref 11–307)

## 2020-09-05 LAB — COMPREHENSIVE METABOLIC PANEL
ALT: 29 U/L (ref 0–44)
AST: 39 U/L (ref 15–41)
Albumin: 3.5 g/dL (ref 3.5–5.0)
Alkaline Phosphatase: 160 U/L — ABNORMAL HIGH (ref 38–126)
Anion gap: 12 (ref 5–15)
BUN: 9 mg/dL (ref 8–23)
CO2: 24 mmol/L (ref 22–32)
Calcium: 9 mg/dL (ref 8.9–10.3)
Chloride: 102 mmol/L (ref 98–111)
Creatinine, Ser: 0.75 mg/dL (ref 0.44–1.00)
GFR, Estimated: >60 mL/min (ref >60)
Glucose, Bld: 127 mg/dL — ABNORMAL HIGH (ref 70–99)
Potassium: 3.7 mmol/L (ref 3.5–5.1)
Sodium: 138 mmol/L (ref 135–145)
Total Bilirubin: 0.4 mg/dL (ref 0.3–1.2)
Total Protein: 7.8 g/dL (ref 6.5–8.1)

## 2020-09-05 LAB — IRON AND TIBC
Iron: 44 ug/dL (ref 28–170)
Saturation Ratios: 13 % (ref 10.4–31.8)
TIBC: 337 ug/dL (ref 250–450)
UIBC: 293 ug/dL

## 2020-09-05 LAB — VITAMIN B12: Vitamin B-12: 274 pg/mL (ref 180–914)

## 2020-09-05 LAB — FOLATE: Folate: 19.4 ng/mL (ref >5.9)

## 2020-09-05 NOTE — Assessment & Plan Note (Addendum)
#    Pancreas adenocarcinoma-Stage IB-/boderline resectable [Abutment of the SMV s/p neoadjuvant FOLFIRINX. #10 cycles]-s/p Whipple's [on April 19th 2022]- ypT2 [3.5cm]; pN0/11  #Discussed/reviewed with the patient the pathology report-in detail.  Discussed that presence of residual malignancy is a poor prognostic factor.  However patient not too keen on proceeding with 2 more cycles of chemo because of residual neuropathy.  See below.  Recommend repeating a scan end of July.  # DIS-CONTINUE  FOLFIRINOX cycle #11 & 12-  Sec to PN-2.  Poor tolerance to gabapentin.  Start Cymbalta 20 mg.    # Hypertension-poorly controlled 159/92]; Continue Norvasc/Metoprlol.[ per pt At home- does not check];AGAIN RE-ITERATED to bring log.   # mediport malfunction: flushing; not drawing; NOV- dye study shows a fibrin sheath.     # DISPOSITION:  # Follow up in last week of July 2022 MD; labs- cbc/cmp/ca-19-9; port flush; CT scan A/P prior- Dr.B

## 2020-09-05 NOTE — Progress Notes (Signed)
Nutrition Follow-up:  Patient with pancreatic cancer, s/p neoadjuvant chemotherapy and whipple on 4/19 with J-tube placement.  J-tube clogged prior to discharge but replaced 1 week later (5/4)  Met with patient in clinic today following MD.  Patient has only used J-tube 1 time since replacement and feeding caused nausea and vomiting so patient has never retried it.  Continues to flush tube daily.  Appetite is improving.  Reports this am ate pancake, hashbrown, egg with cheese and bacon.  Other breakfast options she usually eats eggs and bacon and toast.  Yesterday ate 2 hot dogs with chili and condiments and baked beans.  Supper last night was ham and cheese sandwich.  Ate 3 am ate cheese its.  Drinking about 1 boost plus per day. Drinking tea, pepsi, lemonade, water.    Denies, nausea, vomiting, diarrhea, abdominal pain  Medications: reviewed  Labs: glucose 127  Anthropometrics:   Weight 173 lb 12.8 today  171 lb on 5/27 183 lb on 5/13 183 lb on 4/29 186 lb on 3/29 prior to hospitalization   NUTRITION DIAGNOSIS: Inadequate oral intake improving   INTERVENTION:  Patient flushing J-tube daily Encouraged patient to continue eating high calorie, high protein foods to continue weight gain. Patient to continue boost plus to prevent weight loss.  Contact information    MONITORING, EVALUATION, GOAL: weight trends, intake   NEXT VISIT: Friday, July 29 following MD visit  Robin Daniels, [place], [place] Registered Dietitian [phone] (mobile)

## 2020-09-05 NOTE — Progress Notes (Signed)
[place] NOTE  Patient Care Team: [place] as PCP - General [name], [place], FNP (Family Medicine) Rico Junker, RN as Registered Nurse Theodore Demark, RN as Registered Nurse Clent Jacks, RN as Oncology Nurse Navigator Stark Klein, MD as Consulting Physician (General Surgery)  CHIEF COMPLAINTS/PURPOSE OF CONSULTATION: pancreas adenocarcinoma   Oncology History Overview Note  #Pancreas adenocarcinoma-Stage IB- [id number]-[EUS- Dr.Spaete; [facility]/GI; mass 22x73mm; invading/abutting superior mesenteric vein; 2 enlarged lymph nodes peripancreatic/porta hepatis largest 10 x 5.8 mm-nonpathologic based on EUS criteria/no biopsy]-borderline resectable.  PET scan no evidence of distant metastatic disease.  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 Stevan Born September]  # 01/02/2020-FOLFIRINOX; Neulasta. S/p FOLFIRINOX 10 cycles- April 19th whipples-  s/p neoadjuvant FOLFIRINX. #10 cycles]-s/p Whipple's [on April 19th 2022]- ypT2 [3.5cm]; pN0/11  PANCREAS (EXOCRINE), CARCINOMA: Resection  Procedure: Whipple procedure  Tumor Site: Head of pancreas.  Tumor Size: 3.5 cm, slide measurement.  Histologic Type: Pancreatic ductal adenocarcinoma.  Histologic Grade: Moderately differentiated.  Tumor Extension: Into peripancreatic connective tissue.  Treatment Effect: Moderate treatment effect.  Lymphovascular Invasion: Not identified.  Perineural Invasion: Present.  Margins: All surgical margins are negative for carcinoma.  Regional Lymph Nodes:       Number of Lymph Nodes with Tumor: 0       Number of Lymph Nodes Examined: 15  Distant Metastasis:       Distant Site(s) Involved: Not applicable.  Pathologic Stage Classification (pTNM, AJCC 8th Edition): ypT2, ypN0   # Borderline DM; HTN   # SURVIVORSHIP:   #  GENETICS:   DIAGNOSIS: Pancreatic cancer  STAGE:  IB/borderline resectable;  GOALS: cure  CURRENT/MOST RECENT THERAPY : Neoadjuvant-FOLFIRINOX    Cancer of head of pancreas ([place])  12/04/2019 Initial Diagnosis   Cancer of head of pancreas ([place])   12/19/2019 - 12/19/2019 Chemotherapy   The patient had PACLitaxel-protein bound (ABRAXANE) chemo infusion 250 mg, 125 mg/m2 = 250 mg, Intravenous,  Once, 1 of 4 cycles Administration: 250 mg (12/19/2019) gemcitabine (GEMZAR) 2,000 mg in sodium chloride 0.9 % 250 mL chemo infusion, 1,976 mg, Intravenous,  Once, 1 of 4 cycles Administration: 2,000 mg (12/19/2019)   for chemotherapy treatment.     01/02/2020 -  Chemotherapy    Patient is on Treatment Plan: PANCREAS MODIFIED FOLFIRINOX Q14D X 4 CYCLES        HISTORY OF PRESENTING ILLNESS:  Robin Daniels 65 y.o.  female borderline resectable pancreatic adeno ca currently s/p neoadjuvant chemotherapy with FOLFIRINOX x10 cycles-status post Whipple's on April 19 here for follow-up.  Postoperatively complication with enteral feeding needing a feeding tube.  Currently feeding tube explanted.  Patient is eating poorly.  Denies any nausea vomiting abdominal pain.  Review of Systems  Constitutional:  Positive for weight loss. Negative for chills, diaphoresis, fever and malaise/fatigue.  HENT:  Negative for nosebleeds and sore throat.   Eyes:  Negative for double vision.  Respiratory:  Negative for cough, hemoptysis, sputum production, shortness of breath and wheezing.   Cardiovascular:  Negative for chest pain, palpitations, orthopnea and leg swelling.  Gastrointestinal:  Negative for abdominal pain, blood in stool, constipation, diarrhea, heartburn, melena, nausea and vomiting.  Genitourinary:  Negative for dysuria, frequency and urgency.  Musculoskeletal:  Negative for back pain and joint pain.  Skin: Negative.  Negative for itching and rash.  Neurological:  Positive for tingling. Negative for  dizziness, focal weakness, weakness and headaches.  Endo/Heme/Allergies:  Does not bruise/bleed easily.  Psychiatric/Behavioral:  Negative for depression. The patient is not nervous/anxious and does not have insomnia.     MEDICAL HISTORY:  Past Medical History:  Diagnosis Date   Anemia    history of   Anxiety 09/11/2014   Arthritis    Cancer of head of pancreas ([place]) 08/29/9526   Complication of anesthesia    Fluttering heart    Hyperlipidemia    Hypertension    PONV (postoperative nausea and vomiting)    Pre-diabetes     SURGICAL HISTORY: Past Surgical History:  Procedure Laterality Date   ENDOSCOPIC RETROGRADE CHOLANGIOPANCREATOGRAPHY (ERCP) WITH PROPOFOL N/A 11/16/2019   Procedure: ENDOSCOPIC RETROGRADE CHOLANGIOPANCREATOGRAPHY (ERCP) WITH PROPOFOL;  Surgeon: Lucilla Lame, MD;  Location: [facility] ENDOSCOPY;  Service: Endoscopy;  Laterality: N/A;   ERCP N/A 03/27/2020   Procedure: ENDOSCOPIC RETROGRADE CHOLANGIOPANCREATOGRAPHY (ERCP);  Surgeon: Lucilla Lame, MD;  Location[facility] ENDOSCOPY;  Service: Endoscopy;  Laterality: N/A;   EUS N/A 11/29/2019   Procedure: FULL UPPER ENDOSCOPIC ULTRASOUND (EUS) RADIAL;  Surgeon: Reita Cliche, MD;  Location: [facility] ENDOSCOPY;  Service: Gastroenterology;  Laterality: N/A;   IR FLUORO RM 30-60 MIN  07/30/2020   IR IMAGING GUIDED PORT INSERTION  12/14/2019   JEJUNOSTOMY Left 07/15/2020   Procedure: Shanon Rosser;  Surgeon: Stark Klein, MD;  Location: [facility];  Service: General;  Laterality: Left;   LAPAROSCOPY N/A 07/15/2020   Procedure: DIAGNOSTIC LAPAROSCOPY;  Surgeon: Stark Klein, MD;  Location: [facility];  Service: General;  Laterality: N/A;   right knee replacement Right [id number]   SHOULDER ARTHROSCOPY W/ ROTATOR CUFF REPAIR Right    WHIPPLE PROCEDURE N/A 07/15/2020   Procedure: WHIPPLE PROCEDURE;  Surgeon: Stark Klein, MD;  Location: [facility];  Service: General;  Laterality: N/A;    SOCIAL HISTORY: Social History   Socioeconomic History   Marital  status: Divorced    Spouse name: Not on file   Number of children: Not on file   Years of education: Not on file   Highest education level: Not on file  Occupational History   Not on file  Tobacco Use   Smoking status: Never   Smokeless tobacco: Never  Vaping Use   Vaping Use: Never used  Substance and Sexual Activity   Alcohol use: No    Alcohol/week: 0.0 standard drinks   Drug use: No   Sexual activity: Never  Other Topics Concern   Not on file  Social History Narrative   Lives in [place]; with mom and son; worked in dietary; never smoked; no alcohol.    Social Determinants of Health   Financial Resource Strain: Not on file  Food Insecurity: Not on file  Transportation Needs: Not on file  Physical Activity: Not on file  Stress: Not on file  Social Connections: Not on file  Intimate Partner Violence: Not on file    FAMILY HISTORY: Family History  Problem Relation Age of Onset   Diabetes Mother    Hyperlipidemia Mother    Hypertension Mother    Vision loss Mother    Arthritis Father    Hyperlipidemia Father    Hypertension Father    Breast cancer Maternal Aunt     ALLERGIES:  has No Known Allergies.  MEDICATIONS:  Current Outpatient Medications  Medication Sig Dispense Refill   amLODipine (NORVASC) 10 MG tablet Take 1 tablet (10 mg total) by mouth daily. 30 tablet 0   feeding supplement (ENSURE ENLIVE / ENSURE  PLUS) LIQD Take 237 mLs by mouth 2 (two) times daily between meals. 237 mL 12   lidocaine-prilocaine (EMLA) cream Apply 1 application topically daily as needed (numbing).     metoprolol succinate (TOPROL-XL) 50 MG 24 hr tablet Take 1 tablet (50 mg total) by mouth daily. Take with or immediately following a meal. 30 tablet 0   pantoprazole (PROTONIX) 40 MG tablet Take 1 tablet (40 mg total) by mouth at bedtime. 30 tablet 2   Potassium Chloride ER 20 MEQ TBCR Take 20 mEq by mouth in the morning and at bedtime. 60 tablet 1   vitamin B-12 (CYANOCOBALAMIN)  1000 MCG tablet Take 1 tablet (1,000 mcg total) by mouth daily.     acetaminophen (TYLENOL) 325 MG tablet Take 650 mg by mouth every 6 (six) hours as needed for moderate pain. (Patient not taking: No sig reported)     diphenoxylate-atropine (LOMOTIL) 2.5-0.025 MG tablet Take 1 tablet by mouth 4 (four) times daily as needed for diarrhea or loose stools. Take it along with immodium (Patient not taking: No sig reported) 45 tablet 0   DULoxetine (CYMBALTA) 20 MG capsule Take 1 capsule (20 mg total) by mouth daily. (Patient not taking: Reported on 09/05/2020) 30 capsule 3   gabapentin (NEURONTIN) 100 MG capsule Take 1 capsule (100 mg total) by mouth 3 (three) times daily. (Patient not taking: No sig reported) 90 capsule 2   ondansetron (ZOFRAN-ODT) 4 MG disintegrating tablet Take 1 tablet (4 mg total) by mouth every 6 (six) hours as needed for nausea. (Patient not taking: No sig reported) 20 tablet 0   oxyCODONE (OXY IR/ROXICODONE) 5 MG immediate release tablet Take 1 tablet (5 mg total) by mouth every 4 (four) hours as needed for moderate pain. (Patient not taking: No sig reported) 5 tablet 0   No current facility-administered medications for this visit.      Marland Kitchen  PHYSICAL EXAMINATION: ECOG PERFORMANCE STATUS: 1 - Symptomatic but completely ambulatory  Vitals:   09/05/20 1028  BP: (!) 159/88  Pulse: 97  Resp: 16  Temp: (!) 96.9 F (36.1 C)  SpO2: 98%   Filed Weights   09/05/20 1028  Weight: 173 lb 12.8 oz (78.8 kg)    Physical Exam HENT:     Head: Normocephalic and atraumatic.     Mouth/Throat:     Pharynx: No oropharyngeal exudate.  Eyes:     Pupils: Pupils are equal, round, and reactive to light.  Cardiovascular:     Rate and Rhythm: Normal rate and regular rhythm.  Pulmonary:     Effort: Pulmonary effort is normal. No respiratory distress.     Breath sounds: Normal breath sounds. No wheezing.  Abdominal:     General: Bowel sounds are normal. There is no distension.      Palpations: Abdomen is soft. There is no mass.     Tenderness: no abdominal tenderness There is no guarding or rebound.  Musculoskeletal:        General: No tenderness. Normal range of motion.     Cervical back: Normal range of motion and neck supple.  Skin:    General: Skin is warm.  Neurological:     Mental Status: She is alert and oriented to person, place, and time.  Psychiatric:        Mood and Affect: Affect normal.     LABORATORY DATA:  I have reviewed the data as listed Lab Results  Component Value Date   WBC 4.3 09/05/2020   HGB  9.0 (L) 09/05/2020   HCT 28.6 (L) 09/05/2020   MCV 81.3 09/05/2020   PLT 267 09/05/2020   Recent Labs    11/21/19 1449 12/04/19 1327 12/19/19 0833 01/02/20 0831 08/08/20 0910 08/22/20 0844 09/05/20 0957  NA 133* 136 139   < > 136 138 138  K 4.2 3.6 3.2*   < > 3.2* 3.4* 3.7  CL 91* 102 102   < > 101 102 102  CO2 30 26 26    < > 26 24 24   GLUCOSE 169* 126* 168*   < > 124* 125* 127*  BUN 15 7* 8   < > 6* 8 9  CREATININE 1.02* 0.90 0.89   < > 0.73 0.71 0.75  CALCIUM 9.0 8.7* 8.9   < > 9.0 9.0 9.0  GFRNONAA 57* >60 >60   < > >60 >60 >60  GFRAA >60 >60 >60  --   --   --   --   PROT 8.3* 7.3 7.6   < > 8.1 7.5 7.8  ALBUMIN 3.4* 3.5 3.5   < > 3.5 3.6 3.5  AST 45* 19 35   < > 24 30 39  ALT 137* 22 50*   < > 14 20 29   ALKPHOS 405* 156* 294*   < > 130* 139* 160*  BILITOT 1.8* 0.9 0.7   < > 0.3 0.6 0.4   < > = values in this interval not displayed.    RADIOGRAPHIC STUDIES: I have personally reviewed the radiological images as listed and agreed with the findings in the report. No results found.  ASSESSMENT & PLAN:   Cancer of head of pancreas ([place]) #  Pancreas adenocarcinoma-Stage IB-/boderline resectable [Abutment of the SMV s/p neoadjuvant FOLFIRINX. #10 cycles]-s/p Whipple's [on April 19th 2022]- ypT2 [3.5cm]; pN0/11  #Discussed/reviewed with the patient the pathology report-in detail.  Discussed that presence of residual malignancy  is a poor prognostic factor.  However patient not too keen on proceeding with 2 more cycles of chemo because of residual neuropathy.  See below.  Recommend repeating a scan end of July.  # DIS-CONTINUE  FOLFIRINOX cycle #11 & 12-  Sec to PN-2.  Poor tolerance to gabapentin.  Start Cymbalta 20 mg.    # Hypertension-poorly controlled 159/92]; Continue Norvasc/Metoprlol.[ per pt At home- does not check];AGAIN RE-ITERATED to bring log.   # mediport malfunction: flushing; not drawing; NOV- dye study shows a fibrin sheath.     # DISPOSITION:  # Follow up in last week of July 2022 MD; labs- cbc/cmp/ca-19-9; port flush; CT scan A/P prior- Dr.B  All questions were answered. The patient knows to call the clinic with any problems, questions or concerns.    Cammie Sickle, MD 09/11/2020 10:16 PM

## 2020-09-05 NOTE — Progress Notes (Signed)
Has had increased numbness and tingling in both hands and feet that started a couple of weeks ago.

## 2020-09-06 LAB — CANCER ANTIGEN 19-9: CA 19-9: 17 U/mL (ref 0–35)

## 2020-09-11 ENCOUNTER — Encounter: Payer: Self-pay | Admitting: Internal Medicine

## 2020-10-23 ENCOUNTER — Other Ambulatory Visit: Payer: Self-pay

## 2020-10-23 ENCOUNTER — Ambulatory Visit
Admission: RE | Admit: 2020-10-23 | Discharge: 2020-10-23 | Disposition: A | Discharge status: Home / Self Care | Payer: Medicare Other | Source: Ambulatory Visit | Attending: Internal Medicine | Admitting: Internal Medicine

## 2020-10-23 DIAGNOSIS — C250 Malignant neoplasm of head of pancreas: Secondary | ICD-10-CM | POA: Diagnosis present

## 2020-10-23 LAB — POCT I-STAT CREATININE: Creatinine, Ser: 0.8 mg/dL (ref 0.44–1.00)

## 2020-10-23 MED ORDER — IOHEXOL 350 MG/ML SOLN
80.0000 mL | Freq: Once | INTRAVENOUS | Status: AC | PRN
  Administered 2020-10-23: 80 mL via INTRAVENOUS

## 2020-10-24 ENCOUNTER — Inpatient Hospital Stay (HOSPITAL_BASED_OUTPATIENT_CLINIC_OR_DEPARTMENT_OTHER): Payer: Medicare Other | Admitting: Internal Medicine

## 2020-10-24 ENCOUNTER — Encounter: Payer: Self-pay | Admitting: Internal Medicine

## 2020-10-24 ENCOUNTER — Inpatient Hospital Stay: Payer: Medicare Other

## 2020-10-24 ENCOUNTER — Inpatient Hospital Stay: Payer: Medicare Other | Attending: Internal Medicine

## 2020-10-24 ENCOUNTER — Telehealth: Payer: Self-pay | Admitting: Internal Medicine

## 2020-10-24 DIAGNOSIS — C250 Malignant neoplasm of head of pancreas: Secondary | ICD-10-CM

## 2020-10-24 DIAGNOSIS — I10 Essential (primary) hypertension: Secondary | ICD-10-CM | POA: Insufficient documentation

## 2020-10-24 DIAGNOSIS — Z452 Encounter for adjustment and management of vascular access device: Secondary | ICD-10-CM | POA: Insufficient documentation

## 2020-10-24 LAB — CBC WITH DIFFERENTIAL/PLATELET
Abs Immature Granulocytes: 0 10*3/uL (ref 0.00–0.07)
Basophils Absolute: 0 10*3/uL (ref 0.0–0.1)
Basophils Relative: 0 %
Eosinophils Absolute: 0.1 10*3/uL (ref 0.0–0.5)
Eosinophils Relative: 2 %
HCT: 29.2 % — ABNORMAL LOW (ref 36.0–46.0)
Hemoglobin: 9.1 g/dL — ABNORMAL LOW (ref 12.0–15.0)
Immature Granulocytes: 0 %
Lymphocytes Relative: 49 %
Lymphs Abs: 2.2 10*3/uL (ref 0.7–4.0)
MCH: 24.1 pg — ABNORMAL LOW (ref 26.0–34.0)
MCHC: 31.2 g/dL (ref 30.0–36.0)
MCV: 77.5 fL — ABNORMAL LOW (ref 80.0–100.0)
Monocytes Absolute: 0.4 10*3/uL (ref 0.1–1.0)
Monocytes Relative: 8 %
Neutro Abs: 1.9 10*3/uL (ref 1.7–7.7)
Neutrophils Relative %: 41 %
Platelets: 276 10*3/uL (ref 150–400)
RBC: 3.77 MIL/uL — ABNORMAL LOW (ref 3.87–5.11)
RDW: 16.3 % — ABNORMAL HIGH (ref 11.5–15.5)
WBC: 4.6 10*3/uL (ref 4.0–10.5)
nRBC: 0 % (ref 0.0–0.2)

## 2020-10-24 LAB — COMPREHENSIVE METABOLIC PANEL
ALT: 30 U/L (ref 0–44)
AST: 40 U/L (ref 15–41)
Albumin: 3.5 g/dL (ref 3.5–5.0)
Alkaline Phosphatase: 212 U/L — ABNORMAL HIGH (ref 38–126)
Anion gap: 9 (ref 5–15)
BUN: 11 mg/dL (ref 8–23)
CO2: 25 mmol/L (ref 22–32)
Calcium: 8.9 mg/dL (ref 8.9–10.3)
Chloride: 102 mmol/L (ref 98–111)
Creatinine, Ser: 0.72 mg/dL (ref 0.44–1.00)
GFR, Estimated: >60 mL/min (ref >60)
Glucose, Bld: 100 mg/dL — ABNORMAL HIGH (ref 70–99)
Potassium: 3.4 mmol/L — ABNORMAL LOW (ref 3.5–5.1)
Sodium: 136 mmol/L (ref 135–145)
Total Bilirubin: 0.3 mg/dL (ref 0.3–1.2)
Total Protein: 7.7 g/dL (ref 6.5–8.1)

## 2020-10-24 MED ORDER — HEPARIN SOD (PORK) LOCK FLUSH 100 UNIT/ML IV SOLN
INTRAVENOUS | Status: AC
  Filled 2020-10-24: qty 5

## 2020-10-24 MED ORDER — HEPARIN SOD (PORK) LOCK FLUSH 100 UNIT/ML IV SOLN
500.0000 [IU] | Freq: Once | INTRAVENOUS | Status: AC
  Administered 2020-10-24: 500 [IU] via INTRAVENOUS
  Filled 2020-10-24: qty 5

## 2020-10-24 NOTE — Progress Notes (Signed)
Having numbness in feet and hands that gets worse at night.

## 2020-10-24 NOTE — Progress Notes (Signed)
Nutrition Follow-up:   Patient with pancreatic cancer, s/p neoadjuvant chemotherapy and whipple on 4/19 with J-tube placement.  J-tube clogged prior to discharge but replaced 1 week later (5/4).    Patient left clinic after seeing MD and RD unable to see patient in person today.    RD called patient via phone for nutrition follow-up.  Patient reports that her appetite is good. She denies abdominal pain, nausea, diarrhea or constipation.  Says that she is eating most often 3 meals per day.  Breakfast is usually bacon, egg, cheese, toast with jelly or biscuit.  Lunch is sandwich or burger or hot dog. Dinner varies but usually meat and vegetables.  Drinks boost about 1-2 times per week.    Patient reports that Dr Barry Dienes removed J-tube recently  Medications: reviewed  Labs: reviewed  Anthropometrics:   Weight 171 lb today  173 lb 12.8 oz on 6/10  171 lb on 5/27 183 lb on 5/13 183 lb on 4/29 186 lb on 3/29 prior to hospitalization   NUTRITION DIAGNOSIS: Inadequate oral intake improved    INTERVENTION:  Encouraged well balanced diet including lean sources of protein for weight maintenance Discussed briefly about monitoring sodium intake with elevated blood pressure.   Patient has contact information    MONITORING, EVALUATION, GOAL: weight trends, intake   NEXT VISIT: Friday, October 28 after MD visit  Ileanna Gemmill B. Zenia Resides, [place], [place] Registered Dietitian [phone] (mobile)

## 2020-10-24 NOTE — Assessment & Plan Note (Addendum)
#    Pancreas adenocarcinoma-Stage IB-/boderline resectable [Abutment of the SMV s/p neoadjuvant FOLFIRINX. #10 cycles]-s/p Whipple's [on April 19th 2022]- ypT2 [3.5cm]; pN0/11; # DIS-CONTINUE  FOLFIRINOX cycle #11 & 12-  Sec to PN-2-3. July 2022-CT scan abdomen pelvis does not show any evidence of recurrence.  # PN--2-3.  Poor tolerance to gabapentin; recommend complaince  20 mg.  Recommend patient call us if not improved the next 1 to 2 months we will discuss other options including acupuncture.  # Hypertension-poorly controlled 159/92]; Continue Norvasc/Metoprlol.[ per pt At home- does not check];AGAIN RE-ITERATED to bring log.   # mediport:  continue port flushes q 20M. discussed re: pro and cons of keeping the port vs. Explantation.   We will keep it for now.stable;  # DISPOSITION:  # Follow up in 3 months- MD; labs; cbc/cmp/ca-19-9; port flush- Dr.B  # I reviewed the blood work- with the patient in detail; also reviewed the imaging independently [as summarized above]; and with the patient in detail.

## 2020-10-24 NOTE — Telephone Encounter (Signed)
On 7/29-I called patient and spoke to her regarding the results of the CT scan negative for any recurrence.  Follow-up as planned.  GB

## 2020-10-25 ENCOUNTER — Encounter: Payer: Self-pay | Admitting: Internal Medicine

## 2020-10-25 LAB — CANCER ANTIGEN 19-9: CA 19-9: 16 U/mL (ref 0–35)

## 2020-10-25 NOTE — Progress Notes (Signed)
[name]  Patient Care Team: [place] as PCP - General [name], [name], FNP (Family Medicine) Rico Junker, RN as Registered Nurse Theodore Demark, RN as Registered Nurse Clent Jacks, RN as Oncology Nurse Navigator Stark Klein, MD as Consulting Physician (General Surgery)  CHIEF COMPLAINTS/PURPOSE OF CONSULTATION: pancreas adenocarcinoma   Oncology History Overview Note  #Pancreas adenocarcinoma-Stage IB- [id number]-[EUS- Dr.Spaete; [facility]/GI; mass 22x34m; invading/abutting superior mesenteric vein; 2 enlarged lymph nodes peripancreatic/porta hepatis largest 10 x 5.8 mm-nonpathologic based on EUS criteria/no biopsy]-borderline resectable.  PET scan no evidence of distant metastatic disease.  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 BStevan BornSeptember]  # 01/02/2020-FOLFIRINOX; Neulasta. S/p FOLFIRINOX 10 cycles- April 19th whipples-  s/p neoadjuvant FOLFIRINX. #10 cycles]-s/p Whipple's [on April 19th 2022]- ypT2 [3.5cm]; pN0/11  PANCREAS (EXOCRINE), CARCINOMA: Resection  Procedure: Whipple procedure  Tumor Site: Head of pancreas.  Tumor Size: 3.5 cm, slide measurement.  Histologic Type: Pancreatic ductal adenocarcinoma.  Histologic Grade: Moderately differentiated.  Tumor Extension: Into peripancreatic connective tissue.  Treatment Effect: Moderate treatment effect.  Lymphovascular Invasion: Not identified.  Perineural Invasion: Present.  Margins: All surgical margins are negative for carcinoma.  Regional Lymph Nodes:       Number of Lymph Nodes with Tumor: 0       Number of Lymph Nodes Examined: 15  Distant Metastasis:       Distant Site(s) Involved: Not applicable.  Pathologic Stage Classification (pTNM, AJCC 8th Edition): ypT2, ypN0   # Borderline DM; HTN   # SURVIVORSHIP:   #  GENETICS:   DIAGNOSIS: Pancreatic cancer  STAGE:  IB/borderline resectable;  GOALS: cure  CURRENT/MOST RECENT THERAPY : Neoadjuvant-FOLFIRINOX    Cancer of head of pancreas ([name]  12/04/2019 Initial Diagnosis   Cancer of head of pancreas ([name]   12/19/2019 - 12/19/2019 Chemotherapy   The patient had PACLitaxel-protein bound (ABRAXANE) chemo infusion 250 mg, 125 mg/m2 = 250 mg, Intravenous,  Once, 1 of 4 cycles Administration: 250 mg (12/19/2019) gemcitabine (GEMZAR) 2,000 mg in sodium chloride 0.9 % 250 mL chemo infusion, 1,976 mg, Intravenous,  Once, 1 of 4 cycles Administration: 2,000 mg (12/19/2019)   for chemotherapy treatment.     01/02/2020 -  Chemotherapy    Patient is on Treatment Plan: PANCREAS MODIFIED FOLFIRINOX Q14D X 4 CYCLES        HISTORY OF PRESENTING ILLNESS:  Robin GILPATRICK89y.o.  female borderline resectable pancreatic adeno ca currently s/p neoadjuvant chemotherapy with FOLFIRINOX x10 cycles-status post Whipple's on April 19 here for follow-up/review results of the CT scan.  Patient denies any nausea vomiting abdominal pain.  Denies any chest pain.  No cough.  Her appetite is good.  Unfortunately patient has numbness in the extremities.  She does not feel the bottom of the feet.  However she has not had any falls.  She does not take her Cymbalta.  She failed Cymbalta was not helping and stopped after taking 1 to 2 weeks..  Review of Systems  Constitutional:  Negative for chills, diaphoresis, fever and malaise/fatigue.  HENT:  Negative for nosebleeds and sore throat.   Eyes:  Negative for double vision.  Respiratory:  Negative for cough, hemoptysis, sputum production, shortness of breath and wheezing.   Cardiovascular:  Negative for chest pain, palpitations, orthopnea and leg swelling.  Gastrointestinal:  Negative for abdominal pain, blood in stool,  constipation, diarrhea, heartburn, melena, nausea and vomiting.  Genitourinary:  Negative for dysuria, frequency  and urgency.  Musculoskeletal:  Negative for back pain and joint pain.  Skin: Negative.  Negative for itching and rash.  Neurological:  Positive for tingling. Negative for dizziness, focal weakness, weakness and headaches.  Endo/Heme/Allergies:  Does not bruise/bleed easily.  Psychiatric/Behavioral:  Negative for depression. The patient is not nervous/anxious and does not have insomnia.     MEDICAL HISTORY:  Past Medical History:  Diagnosis Date  . Anemia    history of  . Anxiety 09/11/2014  . Arthritis   . Cancer of head of pancreas ([place]) 12/04/2019  . Complication of anesthesia   . Fluttering heart   . Hyperlipidemia   . Hypertension   . PONV (postoperative nausea and vomiting)   . Pre-diabetes     SURGICAL HISTORY: Past Surgical History:  Procedure Laterality Date  . ENDOSCOPIC RETROGRADE CHOLANGIOPANCREATOGRAPHY (ERCP) WITH PROPOFOL N/A 11/16/2019   Procedure: ENDOSCOPIC RETROGRADE CHOLANGIOPANCREATOGRAPHY (ERCP) WITH PROPOFOL;  Surgeon: Lucilla Lame, MD;  Location: [facility] ENDOSCOPY;  Service: Endoscopy;  Laterality: N/A;  . ERCP N/A 03/27/2020   Procedure: ENDOSCOPIC RETROGRADE CHOLANGIOPANCREATOGRAPHY (ERCP);  Surgeon: Lucilla Lame, MD;  Location[facility] ENDOSCOPY;  Service: Endoscopy;  Laterality: N/A;  . EUS N/A 11/29/2019   Procedure: FULL UPPER ENDOSCOPIC ULTRASOUND (EUS) RADIAL;  Surgeon: Reita Cliche, MD;  Location: [facility] ENDOSCOPY;  Service: Gastroenterology;  Laterality: N/A;  . IR FLUORO RM 30-60 MIN  07/30/2020  . IR IMAGING GUIDED PORT INSERTION  12/14/2019  . JEJUNOSTOMY Left 07/15/2020   Procedure: JEJUNOSTOMY;  Surgeon: Stark Klein, MD;  Location: [facility];  Service: General;  Laterality: Left;  . LAPAROSCOPY N/A 07/15/2020   Procedure: DIAGNOSTIC LAPAROSCOPY;  Surgeon: Stark Klein, MD;  Location: [facility];  Service: General;  Laterality: N/A;  . right knee replacement Right [id number]  . SHOULDER ARTHROSCOPY W/ ROTATOR CUFF REPAIR Right   . WHIPPLE PROCEDURE N/A 07/15/2020    Procedure: WHIPPLE PROCEDURE;  Surgeon: Stark Klein, MD;  Location: [facility];  Service: General;  Laterality: N/A;    SOCIAL HISTORY: Social History   Socioeconomic History  . Marital status: Divorced    Spouse name: Not on file  . Number of children: Not on file  . Years of education: Not on file  . Highest education level: Not on file  Occupational History  . Not on file  Tobacco Use  . Smoking status: Never  . Smokeless tobacco: Never  Vaping Use  . Vaping Use: Never used  Substance and Sexual Activity  . Alcohol use: No    Alcohol/week: 0.0 standard drinks  . Drug use: No  . Sexual activity: Never  Other Topics Concern  . Not on file  Social History Narrative   Lives in [place]; with mom and son; worked in dietary; never smoked; no alcohol.    Social Determinants of Health   Financial Resource Strain: Not on file  Food Insecurity: Not on file  Transportation Needs: Not on file  Physical Activity: Not on file  Stress: Not on file  Social Connections: Not on file  Intimate Partner Violence: Not on file    FAMILY HISTORY: Family History  Problem Relation Age of Onset  . Diabetes Mother   . Hyperlipidemia Mother   . Hypertension Mother   . Vision loss Mother   . Arthritis Father   . Hyperlipidemia Father   . Hypertension Father   . Breast cancer Maternal Aunt  ALLERGIES:  has No Known Allergies.  MEDICATIONS:  Current Outpatient Medications  Medication Sig Dispense Refill  . amLODipine (NORVASC) 10 MG tablet Take 1 tablet (10 mg total) by mouth daily. 30 tablet 0  . feeding supplement (ENSURE ENLIVE / ENSURE PLUS) LIQD Take 237 mLs by mouth 2 (two) times daily between meals. 237 mL 12  . gabapentin (NEURONTIN) 100 MG capsule Take 1 capsule (100 mg total) by mouth 3 (three) times daily. 90 capsule 2  . lidocaine-prilocaine (EMLA) cream Apply 1 application topically daily as needed (numbing).    . metoprolol succinate (TOPROL-XL) 50 MG 24 hr tablet  Take 1 tablet (50 mg total) by mouth daily. Take with or immediately following a meal. 30 tablet 0  . Potassium Chloride ER 20 MEQ TBCR Take 20 mEq by mouth in the morning and at bedtime. 60 tablet 1  . vitamin B-12 (CYANOCOBALAMIN) 1000 MCG tablet Take 1 tablet (1,000 mcg total) by mouth daily.    Marland Kitchen acetaminophen (TYLENOL) 325 MG tablet Take 650 mg by mouth every 6 (six) hours as needed for moderate pain. (Patient not taking: No sig reported)    . diphenoxylate-atropine (LOMOTIL) 2.5-0.025 MG tablet Take 1 tablet by mouth 4 (four) times daily as needed for diarrhea or loose stools. Take it along with immodium (Patient not taking: No sig reported) 45 tablet 0  . DULoxetine (CYMBALTA) 20 MG capsule Take 1 capsule (20 mg total) by mouth daily. (Patient not taking: No sig reported) 30 capsule 3  . ondansetron (ZOFRAN-ODT) 4 MG disintegrating tablet Take 1 tablet (4 mg total) by mouth every 6 (six) hours as needed for nausea. (Patient not taking: No sig reported) 20 tablet 0  . oxyCODONE (OXY IR/ROXICODONE) 5 MG immediate release tablet Take 1 tablet (5 mg total) by mouth every 4 (four) hours as needed for moderate pain. (Patient not taking: No sig reported) 5 tablet 0  . pantoprazole (PROTONIX) 40 MG tablet Take 1 tablet (40 mg total) by mouth at bedtime. (Patient not taking: Reported on 10/24/2020) 30 tablet 2   No current facility-administered medications for this visit.      Marland Kitchen  PHYSICAL EXAMINATION: ECOG PERFORMANCE STATUS: 1 - Symptomatic but completely ambulatory  Vitals:   10/24/20 0931  BP: (!) 150/83  Pulse: 81  Resp: 16  Temp: (!) 96.2 F (35.7 C)  SpO2: 99%   Filed Weights   10/24/20 0931  Weight: 171 lb (77.6 kg)    Physical Exam HENT:     Head: Normocephalic and atraumatic.     Mouth/Throat:     Pharynx: No oropharyngeal exudate.  Eyes:     Pupils: Pupils are equal, round, and reactive to light.  Cardiovascular:     Rate and Rhythm: Normal rate and regular rhythm.   Pulmonary:     Effort: Pulmonary effort is normal. No respiratory distress.     Breath sounds: Normal breath sounds. No wheezing.  Abdominal:     General: Bowel sounds are normal. There is no distension.     Palpations: Abdomen is soft. There is no mass.     Tenderness: There is no abdominal tenderness. There is no guarding or rebound.  Musculoskeletal:        General: No tenderness. Normal range of motion.     Cervical back: Normal range of motion and neck supple.  Skin:    General: Skin is warm.  Neurological:     Mental Status: She is alert and oriented to person, place,  and time.  Psychiatric:        Mood and Affect: Affect normal.     LABORATORY DATA:  I have reviewed the data as listed Lab Results  Component Value Date   WBC 4.6 10/24/2020   HGB 9.1 (L) 10/24/2020   HCT 29.2 (L) 10/24/2020   MCV 77.5 (L) 10/24/2020   PLT 276 10/24/2020   Recent Labs    11/21/19 1449 12/04/19 1327 12/19/19 0833 01/02/20 0831 08/22/20 0844 09/05/20 0957 10/23/20 1031 10/24/20 0917  NA 133* 136 139   < > 138 138  --  136  K 4.2 3.6 3.2*   < > 3.4* 3.7  --  3.4*  CL 91* 102 102   < > 102 102  --  102  CO2 '30 26 26   '$ < > 24 24  --  25  GLUCOSE 169* 126* 168*   < > 125* 127*  --  100*  BUN 15 7* 8   < > 8 9  --  11  CREATININE 1.02* 0.90 0.89   < > 0.71 0.75 0.80 0.72  CALCIUM 9.0 8.7* 8.9   < > 9.0 9.0  --  8.9  GFRNONAA 57* >60 >60   < > >60 >60  --  >60  GFRAA >60 >60 >60  --   --   --   --   --   PROT 8.3* 7.3 7.6   < > 7.5 7.8  --  7.7  ALBUMIN 3.4* 3.5 3.5   < > 3.6 3.5  --  3.5  AST 45* 19 35   < > 30 39  --  40  ALT 137* 22 50*   < > 20 29  --  30  ALKPHOS 405* 156* 294*   < > 139* 160*  --  212*  BILITOT 1.8* 0.9 0.7   < > 0.6 0.4  --  0.3   < > = values in this interval not displayed.    RADIOGRAPHIC STUDIES: I have personally reviewed the radiological images as listed and agreed with the findings in the report. CT Abdomen Pelvis W Contrast  Result Date:  10/24/2020 CLINICAL DATA:  Restaging pancreatic adenocarcinoma. EXAM: CT ABDOMEN AND PELVIS WITH CONTRAST TECHNIQUE: Multidetector CT imaging of the abdomen and pelvis was performed using the standard protocol following bolus administration of intravenous contrast. CONTRAST:  11m OMNIPAQUE IOHEXOL 350 MG/ML SOLN COMPARISON:  07/01/2020 FINDINGS: Lower chest: The bottom margin of the right pleural lipoma is seen on today's exam. Left anterior descending coronary artery atherosclerosis. Hepatobiliary: Pneumobilia. Interval Whipple procedure, with presumably retrograde contrast filling of the pancreaticobiliary limb of small bowel by oral contrast which also progresses retrograde from the choledochoenterostomy into the biliary tree. No definite liver lesion is identified. Small amount of perihepatic ascites, new compared to the prior exam. Pancreas: Interval resection of the pancreatic head. A thin pancreatic stent is present in the pancreatic body and extending towards the pancreaticojejunostomy. Indistinct tissue planes in this vicinity but no definite residual mass observed. Spleen: Unremarkable Adrenals/Urinary Tract: Unremarkable Stomach/Bowel: Patent gastrojejunostomy.  Normal appendix. Vascular/Lymphatic: Aortoiliac atherosclerotic vascular disease. No appreciable tumor along the portal vein, celiac trunk, or SMA. Small regional mesenteric lymph nodes measure up to 0.7 cm in short axis on image 34 series 2; no pathologic adenopathy observed. Reproductive: Unremarkable Other: No supplemental non-categorized findings. Musculoskeletal: Grade 1 degenerative anterolisthesis at L3-4 and L4-5 with associated spondylosis and degenerative disc disease leading to low levels of foraminal  impingement. IMPRESSION: 1. Interval Whipple procedure without complicating feature. No active tumor is currently visible. 2. Presumed retrograde flow of oral contrast medium into the pancreaticobiliary limb of small bowel and through the  choledochoenterostomy into the biliary tree. 3. Linear pancreatic duct stent in the body of the pancreas, without dorsal pancreatic duct dilatation or findings of acute pancreatitis. 4. There is a small amount of perihepatic ascites. 5. Other imaging findings of potential clinical significance: Aortic Atherosclerosis (ICD10-I70.0). Coronary atherosclerosis. Low-level foraminal impingement at L3-4 and L4-5. Electronically Signed   By: Van Clines M.D.   On: 10/24/2020 13:30    ASSESSMENT & PLAN:   Cancer of head of pancreas ([place]) #  Pancreas adenocarcinoma-Stage IB-/boderline resectable [Abutment of the SMV s/p neoadjuvant FOLFIRINX. #10 cycles]-s/p Whipple's [on April 19th 2022]- ypT2 [3.5cm]; pN0/11; # DIS-CONTINUE  FOLFIRINOX cycle #11 & 12-  Sec to PN-2-3. July 2022-CT scan abdomen pelvis does not show any evidence of recurrence.  # PN--2-3.  Poor tolerance to gabapentin; recommend complaince  20 mg.  Recommend patient call us if not improved the next 1 to 2 months we will discuss other options including acupuncture.  # Hypertension-poorly controlled 159/92]; Continue Norvasc/Metoprlol.[ per pt At home- does not check];AGAIN RE-ITERATED to bring log.   # mediport:  continue port flushes q 37M. discussed re: pro and cons of keeping the port vs. Explantation.   We will keep it for now.stable;  # DISPOSITION:  # Follow up in 3 months- MD; labs; cbc/cmp/ca-19-9; port flush- Dr.B  # I reviewed the blood work- with the patient in detail; also reviewed the imaging independently [as summarized above]; and with the patient in detail.    All questions were answered. The patient knows to call the clinic with any problems, questions or concerns.    Cammie Sickle, MD 10/25/2020 11:33 AM

## 2020-12-11 ENCOUNTER — Telehealth: Payer: Self-pay | Admitting: *Deleted

## 2020-12-11 NOTE — Telephone Encounter (Signed)
  Robin Daniels/ [facility]- please check if pt sHe's interested in general counseling given History of pancreatic cancer. If she's interested please make a referral to genetics. Thank you,

## 2020-12-12 ENCOUNTER — Encounter: Payer: Self-pay | Admitting: Internal Medicine

## 2020-12-12 NOTE — Telephone Encounter (Signed)
Dr. Jacinto Reap- pt declined genetic testing at this time. She wants to hold off and speak to you further about the need for genetic testing at future apts. She does not feel this is necessary at this time.

## 2020-12-23 DIAGNOSIS — E440 Moderate protein-calorie malnutrition: Secondary | ICD-10-CM | POA: Insufficient documentation

## 2020-12-26 ENCOUNTER — Other Ambulatory Visit: Payer: Self-pay | Admitting: General Surgery

## 2020-12-26 DIAGNOSIS — Z1231 Encounter for screening mammogram for malignant neoplasm of breast: Secondary | ICD-10-CM

## 2020-12-29 ENCOUNTER — Telehealth: Payer: Self-pay

## 2020-12-29 ENCOUNTER — Encounter: Payer: Self-pay | Admitting: Internal Medicine

## 2020-12-29 ENCOUNTER — Other Ambulatory Visit: Payer: Self-pay

## 2020-12-29 DIAGNOSIS — Z1211 Encounter for screening for malignant neoplasm of colon: Secondary | ICD-10-CM

## 2020-12-29 MED ORDER — CLENPIQ 10-3.5-12 MG-GM -GM/160ML PO SOLN: 1.0000 | ORAL | 0 refills | Status: DC

## 2020-12-29 NOTE — Telephone Encounter (Signed)
Left a message for a call back.

## 2020-12-29 NOTE — Progress Notes (Signed)
Gastroenterology Pre-Procedure Review  Request Date: 01/13/2021 Requesting Physician: Dr. Vicente Males  PATIENT REVIEW QUESTIONS: The patient responded to the following health history questions as indicated:    1. Are you having any GI issues? no 2. Do you have a personal history of Polyps? Yes they were removed 3. Do you have a family history of Colon Cancer or Polyps? no 4. Diabetes Mellitus? no 5. Joint replacements in the past 12 months?no 6. Major health problems in the past 3 months?no 7. Any artificial heart valves, MVP, or defibrillator?no    MEDICATIONS & ALLERGIES:    Patient reports the following regarding taking any anticoagulation/antiplatelet therapy:   Plavix, Coumadin, Eliquis, Xarelto, Lovenox, Pradaxa, Brilinta, or Effient? no Aspirin? no  Patient confirms/reports the following medications:  Current Outpatient Medications  Medication Sig Dispense Refill   acetaminophen (TYLENOL) 325 MG tablet Take 650 mg by mouth every 6 (six) hours as needed for moderate pain. (Patient not taking: No sig reported)     amLODipine (NORVASC) 10 MG tablet Take 1 tablet (10 mg total) by mouth daily. 30 tablet 0   diphenoxylate-atropine (LOMOTIL) 2.5-0.025 MG tablet Take 1 tablet by mouth 4 (four) times daily as needed for diarrhea or loose stools. Take it along with immodium (Patient not taking: No sig reported) 45 tablet 0   DULoxetine (CYMBALTA) 20 MG capsule Take 1 capsule (20 mg total) by mouth daily. (Patient not taking: No sig reported) 30 capsule 3   feeding supplement (ENSURE ENLIVE / ENSURE PLUS) LIQD Take 237 mLs by mouth 2 (two) times daily between meals. 237 mL 12   gabapentin (NEURONTIN) 100 MG capsule Take 1 capsule (100 mg total) by mouth 3 (three) times daily. 90 capsule 2   lidocaine-prilocaine (EMLA) cream Apply 1 application topically daily as needed (numbing).     metoprolol succinate (TOPROL-XL) 50 MG 24 hr tablet Take 1 tablet (50 mg total) by mouth daily. Take with or  immediately following a meal. 30 tablet 0   ondansetron (ZOFRAN-ODT) 4 MG disintegrating tablet Take 1 tablet (4 mg total) by mouth every 6 (six) hours as needed for nausea. (Patient not taking: No sig reported) 20 tablet 0   oxyCODONE (OXY IR/ROXICODONE) 5 MG immediate release tablet Take 1 tablet (5 mg total) by mouth every 4 (four) hours as needed for moderate pain. (Patient not taking: No sig reported) 5 tablet 0   pantoprazole (PROTONIX) 40 MG tablet Take 1 tablet (40 mg total) by mouth at bedtime. (Patient not taking: Reported on 10/24/2020) 30 tablet 2   Potassium Chloride ER 20 MEQ TBCR Take 20 mEq by mouth in the morning and at bedtime. 60 tablet 1   vitamin B-12 (CYANOCOBALAMIN) 1000 MCG tablet Take 1 tablet (1,000 mcg total) by mouth daily.     No current facility-administered medications for this visit.    Patient confirms/reports the following allergies:  No Known Allergies  No orders of the defined types were placed in this encounter.   AUTHORIZATION INFORMATION Primary Insurance: 1D#: Group #:  Secondary Insurance: 1D#: Group #:  SCHEDULE INFORMATION: Date: 01/13/2021 Time: Location: [facility]

## 2021-01-02 ENCOUNTER — Encounter: Payer: Self-pay | Admitting: Internal Medicine

## 2021-01-13 ENCOUNTER — Ambulatory Visit
Admission: RE | Admit: 2021-01-13 | Discharge: 2021-01-13 | Disposition: A | Discharge status: Home / Self Care | Payer: Medicare Other | Source: Ambulatory Visit | Attending: Gastroenterology | Admitting: Gastroenterology

## 2021-01-13 ENCOUNTER — Ambulatory Visit: Payer: Medicare Other | Admitting: Anesthesiology

## 2021-01-13 ENCOUNTER — Encounter
Admission: RE | Disposition: A | Discharge status: Home / Self Care | Payer: Self-pay | Source: Ambulatory Visit | Attending: Gastroenterology

## 2021-01-13 ENCOUNTER — Encounter: Payer: Self-pay | Admitting: Gastroenterology

## 2021-01-13 DIAGNOSIS — D126 Benign neoplasm of colon, unspecified: Secondary | ICD-10-CM | POA: Diagnosis not present

## 2021-01-13 DIAGNOSIS — Z8249 Family history of ischemic heart disease and other diseases of the circulatory system: Secondary | ICD-10-CM | POA: Insufficient documentation

## 2021-01-13 DIAGNOSIS — Z803 Family history of malignant neoplasm of breast: Secondary | ICD-10-CM | POA: Diagnosis not present

## 2021-01-13 DIAGNOSIS — Z8349 Family history of other endocrine, nutritional and metabolic diseases: Secondary | ICD-10-CM | POA: Diagnosis not present

## 2021-01-13 DIAGNOSIS — D122 Benign neoplasm of ascending colon: Secondary | ICD-10-CM | POA: Insufficient documentation

## 2021-01-13 DIAGNOSIS — Z833 Family history of diabetes mellitus: Secondary | ICD-10-CM | POA: Diagnosis not present

## 2021-01-13 DIAGNOSIS — Z79899 Other long term (current) drug therapy: Secondary | ICD-10-CM | POA: Diagnosis not present

## 2021-01-13 DIAGNOSIS — Z1211 Encounter for screening for malignant neoplasm of colon: Secondary | ICD-10-CM | POA: Diagnosis present

## 2021-01-13 HISTORY — PX: COLONOSCOPY WITH PROPOFOL: SHX5780

## 2021-01-13 SURGERY — COLONOSCOPY WITH PROPOFOL
Anesthesia: General

## 2021-01-13 MED ORDER — PROPOFOL 10 MG/ML IV BOLUS
INTRAVENOUS | Status: DC | PRN
  Administered 2021-01-13: 100 mg via INTRAVENOUS

## 2021-01-13 MED ORDER — LIDOCAINE HCL (CARDIAC) PF 100 MG/5ML IV SOSY
PREFILLED_SYRINGE | INTRAVENOUS | Status: DC | PRN
  Administered 2021-01-13: 50 mg via INTRAVENOUS

## 2021-01-13 MED ORDER — DEXMEDETOMIDINE HCL IN NACL 200 MCG/50ML IV SOLN
INTRAVENOUS | Status: DC | PRN
  Administered 2021-01-13: 8 ug via INTRAVENOUS
  Administered 2021-01-13: 4 ug via INTRAVENOUS

## 2021-01-13 MED ORDER — PROPOFOL 500 MG/50ML IV EMUL
INTRAVENOUS | Status: DC | PRN
  Administered 2021-01-13: 120 ug/kg/min via INTRAVENOUS

## 2021-01-13 MED ORDER — SODIUM CHLORIDE 0.9 % IV SOLN: INTRAVENOUS | Status: DC

## 2021-01-13 NOTE — H&P (Signed)
   Kiran Anna, MD [address], [address], [place], [place], [address] [address], [address], [place], [place], [address] Phone: [phone]  Fax: [phone]  Primary Care Physician:  [facility]   Pre-Procedure History & Physical: HPI:  Robin Daniels is a 65 y.o. female is here for an colonoscopy.   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/04/2019   Complication of anesthesia    Fluttering heart    Hyperlipidemia    Hypertension    PONV (postoperative nausea and vomiting)    Pre-diabetes     Past Surgical History:  Procedure Laterality Date   ENDOSCOPIC RETROGRADE CHOLANGIOPANCREATOGRAPHY (ERCP) WITH PROPOFOL N/A 11/16/2019   Procedure: ENDOSCOPIC RETROGRADE CHOLANGIOPANCREATOGRAPHY (ERCP) WITH PROPOFOL;  Surgeon: Wohl, Darren, MD;  Location: [facility] ENDOSCOPY;  Service: Endoscopy;  Laterality: N/A;   ERCP N/A 03/27/2020   Procedure: ENDOSCOPIC RETROGRADE CHOLANGIOPANCREATOGRAPHY (ERCP);  Surgeon: Wohl, Darren, MD;  Location: [facility] ENDOSCOPY;  Service: Endoscopy;  Laterality: N/A;   EUS N/A 11/29/2019   Procedure: FULL UPPER ENDOSCOPIC ULTRASOUND (EUS) RADIAL;  Surgeon: Spaete, Joshua P, MD;  Location: [facility] ENDOSCOPY;  Service: Gastroenterology;  Laterality: N/A;   IR FLUORO RM 30-60 MIN  07/30/2020   IR IMAGING GUIDED PORT INSERTION  12/14/2019   JEJUNOSTOMY Left 07/15/2020   Procedure: JEJUNOSTOMY;  Surgeon: Byerly, Faera, MD;  Location: [facility] OR;  Service: General;  Laterality: Left;   LAPAROSCOPY N/A 07/15/2020   Procedure: DIAGNOSTIC LAPAROSCOPY;  Surgeon: Byerly, Faera, MD;  Location: [facility] OR;  Service: General;  Laterality: N/A;   right knee replacement Right [id number]   SHOULDER ARTHROSCOPY W/ ROTATOR CUFF REPAIR Right    WHIPPLE PROCEDURE N/A 07/15/2020   Procedure: WHIPPLE PROCEDURE;  Surgeon: Byerly, Faera, MD;  Location: [facility] OR;  Service: General;  Laterality: N/A;     Prior to Admission medications   Medication Sig Start Date End Date Taking? Authorizing Provider  amLODipine (NORVASC) 10 MG tablet Take 1 tablet (10 mg total) by mouth daily. 05/27/20  Yes Brahmanday, Govinda R, MD  acetaminophen (TYLENOL) 325 MG tablet Take 650 mg by mouth every 6 (six) hours as needed for moderate pain. Patient not taking: No sig reported    [provider]  diphenoxylate-atropine (LOMOTIL) 2.5-0.025 MG tablet Take 1 tablet by mouth 4 (four) times daily as needed for diarrhea or loose stools. Take it along with immodium Patient not taking: No sig reported 01/02/20   Brahmanday, Govinda R, MD  DULoxetine (CYMBALTA) 20 MG capsule Take 1 capsule (20 mg total) by mouth daily. Patient not taking: No sig reported 08/22/20   Burns, Jennifer E, NP  feeding supplement (ENSURE ENLIVE / ENSURE PLUS) LIQD Take 237 mLs by mouth 2 (two) times daily between meals. 07/22/20   Byerly, Faera, MD  gabapentin (NEURONTIN) 100 MG capsule Take 1 capsule (100 mg total) by mouth 3 (three) times daily. 08/08/20   Burns, Jennifer E, NP  lidocaine-prilocaine (EMLA) cream Apply 1 application topically daily as needed (numbing). 05/07/20   [provider]  metoprolol succinate (TOPROL-XL) 50 MG 24 hr tablet Take 1 tablet (50 mg total) by mouth daily. Take with or immediately following a meal. 05/27/20   Brahmanday, Govinda R, MD  ondansetron (ZOFRAN-ODT) 4 MG disintegrating tablet Take 1 tablet (4 mg total) by mouth every 6 (six) hours as needed for nausea. Patient not   taking: No sig reported 07/22/20   Stark Klein, MD  oxyCODONE (OXY IR/ROXICODONE) 5 MG immediate release tablet Take 1 tablet (5 mg total) by mouth every 4 (four) hours as needed for moderate pain. Patient not taking: No sig reported 07/22/20   Stark Klein, MD  pantoprazole (PROTONIX) 40 MG tablet Take 1 tablet (40 mg total) by mouth at bedtime. Patient not taking: Reported on 10/24/2020 07/22/20   Stark Klein, MD  Potassium  Chloride ER 20 MEQ TBCR Take 20 mEq by mouth in the morning and at bedtime. 05/27/20   Cammie Sickle, MD  Sod Picosulfate-Mag Ox-Cit Acd (CLENPIQ) 10-3.5-12 MG-GM -GM/160ML SOLN Take 1 kit by mouth as directed. At 5 PM evening before procedure, drink 1 bottle of Clenpiq, hydrate, drink (5) 8 oz of water. Then do the same thing 5 hours prior to your procedure. 12/29/20   Jonathon Bellows, MD  vitamin B-12 (CYANOCOBALAMIN) 1000 MCG tablet Take 1 tablet (1,000 mcg total) by mouth daily. 11/17/19   Jennye Boroughs, MD  prochlorperazine (COMPAZINE) 10 MG tablet Take 1 tablet (10 mg total) by mouth every 6 (six) hours as needed (Nausea or vomiting). 12/17/19 01/01/20  Cammie Sickle, MD    Allergies as of 12/30/2020   (Not on File)    Family History  Problem Relation Age of Onset   Diabetes Mother    Hyperlipidemia Mother    Hypertension Mother    Vision loss Mother    Arthritis Father    Hyperlipidemia Father    Hypertension Father    Breast cancer Maternal Aunt     Social History   Socioeconomic History   Marital status: Divorced    Spouse name: Not on file   Number of children: Not on file   Years of education: Not on file   Highest education level: Not on file  Occupational History   Not on file  Tobacco Use   Smoking status: Never   Smokeless tobacco: Never  Vaping Use   Vaping Use: Never used  Substance and Sexual Activity   Alcohol use: No    Alcohol/week: 0.0 standard drinks   Drug use: No   Sexual activity: Never  Other Topics Concern   Not on file  Social History Narrative   ** Merged History Encounter **       Lives in [place]; with mom and son; worked in dietary; never smoked; no alcohol.    Social Determinants of Health   Financial Resource Strain: Not on file  Food Insecurity: Not on file  Transportation Needs: Not on file  Physical Activity: Not on file  Stress: Not on file  Social Connections: Not on file  Intimate Partner Violence: Not on file     Review of Systems: See HPI, otherwise negative ROS  Physical Exam: BP (!) 186/87   Pulse 87   Temp (!) 96.2 F (35.7 C) (Temporal)   Resp 18   Ht 5' 3" (1.6 m)   Wt 81.6 kg   LMP  (LMP Unknown)   SpO2 100%   BMI 31.89 kg/m  General:   Alert,  pleasant and cooperative in NAD Head:  Normocephalic and atraumatic. Neck:  Supple; no masses or thyromegaly. Lungs:  Clear throughout to auscultation, normal respiratory effort.    Heart:  +S1, +S2, Regular rate and rhythm, No edema. Abdomen:  Soft, nontender and nondistended. Normal bowel sounds, without guarding, and without rebound.   Neurologic:  Alert and  oriented x4;  grossly normal neurologically.  Impression/Plan: Robin Daniels is here for an colonoscopy to be performed for Screening colonoscopy average risk   Risks, benefits, limitations, and alternatives regarding  colonoscopy have been reviewed with the patient.  Questions have been answered.  All parties agreeable.   Kiran Anna, MD  01/13/2021, 9:42 AM  

## 2021-01-13 NOTE — Transfer of Care (Signed)
Immediate Anesthesia Transfer of Care Note  Patient: Robin Daniels  Procedure(s) Performed: COLONOSCOPY WITH PROPOFOL  Patient Location: PACU and Endoscopy Unit  Anesthesia Type:General  Level of Consciousness: drowsy and patient cooperative  Airway & Oxygen Therapy: Patient Spontanous Breathing  Post-op Assessment: Report given to RN and Post -op Vital signs reviewed and stable  Post vital signs: Reviewed and stable  Last Vitals:  Vitals Value Taken Time  BP 119/68 01/13/21 1013  Temp 35.7 C 01/13/21 1010  Pulse 75 01/13/21 1015  Resp 20 01/13/21 1015  SpO2 100 % 01/13/21 1015  Vitals shown include unvalidated device data.  Last Pain:  Vitals:   01/13/21 1010  TempSrc: Temporal  PainSc:          Complications: No notable events documented.

## 2021-01-13 NOTE — Anesthesia Preprocedure Evaluation (Signed)
Anesthesia Evaluation  Patient identified by MRN, date of birth, ID band Patient awake    Reviewed: Allergy & Precautions, NPO status , Patient's Chart, lab work & pertinent test results  History of Anesthesia Complications (+) PONV and history of anesthetic complications  Airway Mallampati: III   Neck ROM: Full    Dental no notable dental hx.    Pulmonary neg pulmonary ROS,    Pulmonary exam normal breath sounds clear to auscultation       Cardiovascular hypertension, Normal cardiovascular exam Rhythm:Regular Rate:Normal     Neuro/Psych  Headaches, PSYCHIATRIC DISORDERS Anxiety    GI/Hepatic negative GI ROS,   Endo/Other  Pancreatic cancer s/p [facility] 06/2020; prediabetes  Renal/GU negative Renal ROS     Musculoskeletal  (+) Arthritis ,   Abdominal   Peds  Hematology  (+) Blood dyscrasia, anemia ,   Anesthesia Other Findings   Reproductive/Obstetrics                             Anesthesia Physical Anesthesia Plan  ASA: 3  Anesthesia Plan: General   Post-op Pain Management:    Induction: Intravenous  PONV Risk Score and Plan: 4 or greater and Propofol infusion, TIVA and Treatment may vary due to age or medical condition  Airway Management Planned: Natural Airway  Additional Equipment:   Intra-op Plan:   Post-operative Plan:   Informed Consent: I have reviewed the patients History and Physical, chart, labs and discussed the procedure including the risks, benefits and alternatives for the proposed anesthesia with the patient or authorized representative who has indicated his/her understanding and acceptance.       Plan Discussed with: CRNA  Anesthesia Plan Comments:         Anesthesia Quick Evaluation

## 2021-01-13 NOTE — Anesthesia Postprocedure Evaluation (Signed)
Anesthesia Post Note  Patient: Robin Daniels  Procedure(s) Performed: COLONOSCOPY WITH PROPOFOL  Patient location during evaluation: PACU Anesthesia Type: General Level of consciousness: awake and alert, oriented and patient cooperative Pain management: pain level controlled Vital Signs Assessment: post-procedure vital signs reviewed and stable Respiratory status: spontaneous breathing, nonlabored ventilation and respiratory function stable Cardiovascular status: blood pressure returned to baseline and stable Postop Assessment: adequate PO intake Anesthetic complications: no   No notable events documented.   Last Vitals:  Vitals:   01/13/21 1020 01/13/21 1040  BP: 119/68 (!) 159/74  Pulse: 72 69  Resp: (!) 22   Temp:    SpO2: 100% 100%    Last Pain:  Vitals:   01/13/21 1010  TempSrc: Temporal  PainSc:                  Darrin Nipper

## 2021-01-13 NOTE — Op Note (Signed)
[facility] Patient Name: Robin Daniels Procedure Date: 01/13/2021 9:43 AM MRN: [id number] Account #: [id number] Date of Birth: [birth year] Admit Type: Outpatient Age: 65 Room: [facility] ENDO ROOM 2 Gender: Female Note Status: Finalized Instrument Name: Jasper Riling [id number] Procedure:             Colonoscopy Indications:           Screening for colorectal malignant neoplasm Providers:             Jonathon Bellows MD, MD Referring MD:          No Local Md, MD (Referring MD) Medicines:             Monitored Anesthesia Care Complications:         No immediate complications. Procedure:             Pre-Anesthesia Assessment:                        - Prior to the procedure, a History and Physical was                         performed, and patient medications, allergies and                         sensitivities were reviewed. The patient's tolerance                         of previous anesthesia was reviewed.                        - The risks and benefits of the procedure and the                         sedation options and risks were discussed with the                         patient. All questions were answered and informed                         consent was obtained.                        - ASA Grade Assessment: III - A patient with severe                         systemic disease.                        After obtaining informed consent, the colonoscope was                         passed under direct vision. Throughout the procedure,                         the patient's blood pressure, pulse, and oxygen                         saturations were monitored continuously. The                         Colonoscope was  introduced through the anus and                         advanced to the the cecum, identified by the                         appendiceal orifice. The colonoscopy was performed                         with ease. The patient tolerated the procedure well.                          The quality of the bowel preparation was excellent. Findings:      The perianal and digital rectal examinations were normal.      A 5 mm polyp was found in the ascending colon. The polyp was sessile.       The polyp was removed with a cold snare. Resection and retrieval were       complete.      The exam was otherwise without abnormality on direct and retroflexion       views. Impression:            - One 5 mm polyp in the ascending colon, removed with                         a cold snare. Resected and retrieved.                        - The examination was otherwise normal on direct and                         retroflexion views. Recommendation:        - Discharge patient to home (with escort).                        - Resume previous diet.                        - Continue present medications.                        - Await pathology results.                        - Repeat colonoscopy for surveillance based on                         pathology results. Procedure Code(s):     --- Professional ---                        [phone], Colonoscopy, flexible; with removal of                         tumor(s), polyp(s), or other lesion(s) by snare                         technique Diagnosis Code(s):     --- Professional ---  Z12.11, Encounter for screening for malignant neoplasm                         of colon                        K63.5, Polyp of colon CPT copyright 2019 [employer]. All rights reserved. The codes documented in this report are preliminary and upon coder review may  be revised to meet current compliance requirements. Jonathon Bellows, MD Jonathon Bellows MD, MD 01/13/2021 10:10:39 AM This report has been signed electronically. Number of Addenda: 0 Note Initiated On: 01/13/2021 9:43 AM Scope Withdrawal Time: 0 hours 7 minutes 54 seconds  Total Procedure Duration: 0 hours 11 minutes 27 seconds  Estimated Blood Loss:  Estimated  blood loss: none.      [facility]

## 2021-01-14 ENCOUNTER — Encounter: Payer: Self-pay | Admitting: Gastroenterology

## 2021-01-14 LAB — SURGICAL PATHOLOGY

## 2021-01-23 ENCOUNTER — Inpatient Hospital Stay (HOSPITAL_BASED_OUTPATIENT_CLINIC_OR_DEPARTMENT_OTHER): Payer: Medicare Other | Admitting: Internal Medicine

## 2021-01-23 ENCOUNTER — Inpatient Hospital Stay: Payer: Medicare Other | Attending: Internal Medicine

## 2021-01-23 ENCOUNTER — Other Ambulatory Visit: Payer: Self-pay

## 2021-01-23 ENCOUNTER — Encounter: Payer: Self-pay | Admitting: Internal Medicine

## 2021-01-23 ENCOUNTER — Inpatient Hospital Stay: Payer: Medicare Other

## 2021-01-23 DIAGNOSIS — I10 Essential (primary) hypertension: Secondary | ICD-10-CM | POA: Insufficient documentation

## 2021-01-23 DIAGNOSIS — Z452 Encounter for adjustment and management of vascular access device: Secondary | ICD-10-CM | POA: Insufficient documentation

## 2021-01-23 DIAGNOSIS — C250 Malignant neoplasm of head of pancreas: Secondary | ICD-10-CM | POA: Insufficient documentation

## 2021-01-23 DIAGNOSIS — G629 Polyneuropathy, unspecified: Secondary | ICD-10-CM | POA: Insufficient documentation

## 2021-01-23 DIAGNOSIS — Z95828 Presence of other vascular implants and grafts: Secondary | ICD-10-CM

## 2021-01-23 LAB — CBC WITH DIFFERENTIAL/PLATELET
Abs Immature Granulocytes: 0.02 10*3/uL (ref 0.00–0.07)
Basophils Absolute: 0 10*3/uL (ref 0.0–0.1)
Basophils Relative: 0 %
Eosinophils Absolute: 0.1 10*3/uL (ref 0.0–0.5)
Eosinophils Relative: 1 %
HCT: 32.1 % — ABNORMAL LOW (ref 36.0–46.0)
Hemoglobin: 10.1 g/dL — ABNORMAL LOW (ref 12.0–15.0)
Immature Granulocytes: 0 %
Lymphocytes Relative: 32 %
Lymphs Abs: 2.1 10*3/uL (ref 0.7–4.0)
MCH: 24.2 pg — ABNORMAL LOW (ref 26.0–34.0)
MCHC: 31.5 g/dL (ref 30.0–36.0)
MCV: 77 fL — ABNORMAL LOW (ref 80.0–100.0)
Monocytes Absolute: 0.5 10*3/uL (ref 0.1–1.0)
Monocytes Relative: 8 %
Neutro Abs: 3.8 10*3/uL (ref 1.7–7.7)
Neutrophils Relative %: 59 %
Platelets: 273 10*3/uL (ref 150–400)
RBC: 4.17 MIL/uL (ref 3.87–5.11)
RDW: 15.4 % (ref 11.5–15.5)
WBC: 6.5 10*3/uL (ref 4.0–10.5)
nRBC: 0 % (ref 0.0–0.2)

## 2021-01-23 LAB — COMPREHENSIVE METABOLIC PANEL
ALT: 33 U/L (ref 0–44)
AST: 33 U/L (ref 15–41)
Albumin: 3.8 g/dL (ref 3.5–5.0)
Alkaline Phosphatase: 191 U/L — ABNORMAL HIGH (ref 38–126)
Anion gap: 7 (ref 5–15)
BUN: 13 mg/dL (ref 8–23)
CO2: 26 mmol/L (ref 22–32)
Calcium: 9 mg/dL (ref 8.9–10.3)
Chloride: 103 mmol/L (ref 98–111)
Creatinine, Ser: 0.78 mg/dL (ref 0.44–1.00)
GFR, Estimated: >60 mL/min (ref >60)
Glucose, Bld: 137 mg/dL — ABNORMAL HIGH (ref 70–99)
Potassium: 3.5 mmol/L (ref 3.5–5.1)
Sodium: 136 mmol/L (ref 135–145)
Total Bilirubin: 0.6 mg/dL (ref 0.3–1.2)
Total Protein: 8.1 g/dL (ref 6.5–8.1)

## 2021-01-23 MED ORDER — HEPARIN SOD (PORK) LOCK FLUSH 100 UNIT/ML IV SOLN
500.0000 [IU] | Freq: Once | INTRAVENOUS | Status: AC
  Administered 2021-01-23: 500 [IU] via INTRAVENOUS
  Filled 2021-01-23: qty 5

## 2021-01-23 MED ORDER — SODIUM CHLORIDE 0.9% FLUSH
10.0000 mL | INTRAVENOUS | Status: DC | PRN
  Administered 2021-01-23: 10 mL via INTRAVENOUS
  Filled 2021-01-23: qty 10

## 2021-01-23 MED ORDER — HEPARIN SOD (PORK) LOCK FLUSH 100 UNIT/ML IV SOLN
INTRAVENOUS | Status: AC
  Filled 2021-01-23: qty 5

## 2021-01-23 NOTE — Progress Notes (Signed)
[place] NOTE  Patient Care Team: Mildred as PCP - General [name], [place], FNP (Family Medicine) Rico Junker, RN as Registered Nurse Theodore Demark, RN as Registered Nurse Clent Jacks, RN as Oncology Nurse Navigator Stark Klein, MD as Consulting Physician (General Surgery) Cammie Sickle, MD as Consulting Physician (Internal Medicine)  CHIEF COMPLAINTS/PURPOSE OF CONSULTATION: pancreas adenocarcinoma   Oncology History Overview Note  #Pancreas adenocarcinoma-Stage IB- [id number]-[EUS- Dr.Spaete; [facility]/GI; mass 22x49m; invading/abutting superior mesenteric vein; 2 enlarged lymph nodes peripancreatic/porta hepatis largest 10 x 5.8 mm-nonpathologic based on EUS criteria/no biopsy]-borderline resectable.  PET scan no evidence of distant metastatic disease.  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 BStevan BornSeptember]  # 01/02/2020-FOLFIRINOX; Neulasta. S/p FOLFIRINOX 10 cycles- April 19th whipples-  s/p neoadjuvant FOLFIRINX. #10 cycles]-s/p Whipple's [on April 19th 2022]- ypT2 [3.5cm]; pN0/11  PANCREAS (EXOCRINE), CARCINOMA: Resection  Procedure: Whipple procedure  Tumor Site: Head of pancreas.  Tumor Size: 3.5 cm, slide measurement.  Histologic Type: Pancreatic ductal adenocarcinoma.  Histologic Grade: Moderately differentiated.  Tumor Extension: Into peripancreatic connective tissue.  Treatment Effect: Moderate treatment effect.  Lymphovascular Invasion: Not identified.  Perineural Invasion: Present.  Margins: All surgical margins are negative for carcinoma.  Regional Lymph Nodes:       Number of Lymph Nodes with Tumor: 0       Number of Lymph Nodes Examined: 15  Distant Metastasis:       Distant Site(s) Involved: Not applicable.  Pathologic Stage Classification (pTNM, AJCC 8th  Edition): ypT2, ypN0   # Borderline DM; HTN   # SURVIVORSHIP:   # GENETICS:   DIAGNOSIS: Pancreatic cancer  STAGE:  IB/borderline resectable;  GOALS: cure  CURRENT/MOST RECENT THERAPY : Neoadjuvant-FOLFIRINOX    [place]  [place] Initial Diagnosis   [place] head of [place] ([place]   12/19/2019 - 12/19/2019 Chemotherapy   The patient had PACLitaxel-protein bound (ABRAXANE) chemo infusion 250 mg, 125 mg/m2 = 250 mg, Intravenous,  Once, 1 of 4 cycles Administration: 250 mg (12/19/2019) gemcitabine (GEMZAR) 2,000 mg in sodium chloride 0.9 % 250 mL chemo infusion, 1,976 mg, Intravenous,  Once, 1 of 4 cycles Administration: 2,000 mg (12/19/2019)   for chemotherapy treatment.     01/02/2020 -  Chemotherapy    Patient is on Treatment Plan: PANCREAS MODIFIED FOLFIRINOX Q14D X 4 CYCLES        HISTORY OF PRESENTING ILLNESS:  Robin TULLOCH89y.o.  female borderline resectable pancreatic adeno ca currently s/p neoadjuvant chemotherapy with FOLFIRINOX x10 cycles-status post Whipple's on July 15, 2020  is here for follow-up.   Patient denies any nausea vomiting abdominal pain.  Admits to improvement of her neuropathy; not complete resolve.  She continues to take Cymbalta.  Patient denies any diarrhea.  Review of Systems  Constitutional:  Negative for chills, diaphoresis, fever and malaise/fatigue.  HENT:  Negative for nosebleeds and sore throat.   Eyes:  Negative for double vision.  Respiratory:  Negative for cough, hemoptysis, sputum production, shortness of breath and wheezing.   Cardiovascular:  Negative for chest pain, palpitations, orthopnea and leg swelling.  Gastrointestinal:  Negative for abdominal pain, blood in stool, constipation, diarrhea, heartburn, melena, nausea and vomiting.  Genitourinary:  Negative for dysuria, frequency and urgency.  Musculoskeletal:  Negative for back pain and joint pain.  Skin: Negative.  Negative for  itching and rash.   Neurological:  Positive for tingling. Negative for dizziness, focal weakness, weakness and headaches.  Endo/Heme/Allergies:  Does not bruise/bleed easily.  Psychiatric/Behavioral:  Negative for depression. The patient is not nervous/anxious and does not have insomnia.     MEDICAL HISTORY:  Past Medical History:  Diagnosis Date   Anemia    history of   Anxiety 09/11/2014   Arthritis    Cancer of head of pancreas ([place]) 04/06/2991   Complication of anesthesia    Fluttering heart    Hyperlipidemia    Hypertension    PONV (postoperative nausea and vomiting)    Pre-diabetes     SURGICAL HISTORY: Past Surgical History:  Procedure Laterality Date   COLONOSCOPY WITH PROPOFOL N/A 01/13/2021   Procedure: COLONOSCOPY WITH PROPOFOL;  Surgeon: Jonathon Bellows, MD;  Location: [facility] ENDOSCOPY;  Service: Gastroenterology;  Laterality: N/A;   ENDOSCOPIC RETROGRADE CHOLANGIOPANCREATOGRAPHY (ERCP) WITH PROPOFOL N/A 11/16/2019   Procedure: ENDOSCOPIC RETROGRADE CHOLANGIOPANCREATOGRAPHY (ERCP) WITH PROPOFOL;  Surgeon: Lucilla Lame, MD;  Location: [facility] ENDOSCOPY;  Service: Endoscopy;  Laterality: N/A;   ERCP N/A 03/27/2020   Procedure: ENDOSCOPIC RETROGRADE CHOLANGIOPANCREATOGRAPHY (ERCP);  Surgeon: Lucilla Lame, MD;  Location: [facility] ENDOSCOPY;  Service: Endoscopy;  Laterality: N/A;   EUS N/A 11/29/2019   Procedure: FULL UPPER ENDOSCOPIC ULTRASOUND (EUS) RADIAL;  Surgeon: Reita Cliche, MD;  Location: [facility] ENDOSCOPY;  Service: Gastroenterology;  Laterality: N/A;   IR FLUORO RM 30-60 MIN  07/30/2020   IR IMAGING GUIDED PORT INSERTION  12/14/2019   JEJUNOSTOMY Left 07/15/2020   Procedure: Shanon Rosser;  Surgeon: Stark Klein, MD;  Location: [facility];  Service: General;  Laterality: Left;   LAPAROSCOPY N/A 07/15/2020   Procedure: DIAGNOSTIC LAPAROSCOPY;  Surgeon: Stark Klein, MD;  Location: [facility];  Service: General;  Laterality: N/A;   right knee replacement Right [id number]   SHOULDER ARTHROSCOPY W/ ROTATOR CUFF  REPAIR Right    WHIPPLE PROCEDURE N/A 07/15/2020   Procedure: WHIPPLE PROCEDURE;  Surgeon: Stark Klein, MD;  Location: [facility];  Service: General;  Laterality: N/A;    SOCIAL HISTORY: Social History   Socioeconomic History   Marital status: Divorced    Spouse name: Not on file   Number of children: Not on file   Years of education: Not on file   Highest education level: Not on file  Occupational History   Not on file  Tobacco Use   Smoking status: Never   Smokeless tobacco: Never  Vaping Use   Vaping Use: Never used  Substance and Sexual Activity   Alcohol use: No    Alcohol/week: 0.0 standard drinks   Drug use: No   Sexual activity: Never  Other Topics Concern   Not on file  Social History Narrative   ** Merged History Encounter **       Lives in [place]; with mom and son; worked in dietary; never smoked; no alcohol.    Social Determinants of Health   Financial Resource Strain: Not on file  Food Insecurity: Not on file  Transportation Needs: Not on file  Physical Activity: Not on file  Stress: Not on file  Social Connections: Not on file  Intimate Partner Violence: Not on file    FAMILY HISTORY: Family History  Problem Relation Age of Onset   Diabetes Mother    Hyperlipidemia Mother    Hypertension Mother    Vision loss Mother    Arthritis Father    Hyperlipidemia Father    Hypertension Father    Breast  cancer Maternal Aunt     ALLERGIES:  has No Known Allergies.  MEDICATIONS:  Current Outpatient Medications  Medication Sig Dispense Refill   amLODipine (NORVASC) 10 MG tablet Take 1 tablet (10 mg total) by mouth daily. 30 tablet 0   gabapentin (NEURONTIN) 100 MG capsule Take 1 capsule (100 mg total) by mouth 3 (three) times daily. (Patient taking differently: Take 100 mg by mouth 3 (three) times daily. prn) 90 capsule 2   lidocaine-prilocaine (EMLA) cream Apply 1 application topically daily as needed (numbing).     metoprolol succinate (TOPROL-XL) 50  MG 24 hr tablet Take 1 tablet (50 mg total) by mouth daily. Take with or immediately following a meal. 30 tablet 0   Potassium Chloride ER 20 MEQ TBCR Take 20 mEq by mouth in the morning and at bedtime. 60 tablet 1   vitamin B-12 (CYANOCOBALAMIN) 1000 MCG tablet Take 1 tablet (1,000 mcg total) by mouth daily.     acetaminophen (TYLENOL) 325 MG tablet Take 650 mg by mouth every 6 (six) hours as needed for moderate pain. (Patient not taking: No sig reported)     diphenoxylate-atropine (LOMOTIL) 2.5-0.025 MG tablet Take 1 tablet by mouth 4 (four) times daily as needed for diarrhea or loose stools. Take it along with immodium (Patient not taking: No sig reported) 45 tablet 0   DULoxetine (CYMBALTA) 20 MG capsule Take 1 capsule (20 mg total) by mouth daily. (Patient not taking: No sig reported) 30 capsule 3   feeding supplement (ENSURE ENLIVE / ENSURE PLUS) LIQD Take 237 mLs by mouth 2 (two) times daily between meals. (Patient not taking: Reported on 01/23/2021) 237 mL 12   ondansetron (ZOFRAN-ODT) 4 MG disintegrating tablet Take 1 tablet (4 mg total) by mouth every 6 (six) hours as needed for nausea. (Patient not taking: No sig reported) 20 tablet 0   oxyCODONE (OXY IR/ROXICODONE) 5 MG immediate release tablet Take 1 tablet (5 mg total) by mouth every 4 (four) hours as needed for moderate pain. (Patient not taking: No sig reported) 5 tablet 0   pantoprazole (PROTONIX) 40 MG tablet Take 1 tablet (40 mg total) by mouth at bedtime. (Patient not taking: No sig reported) 30 tablet 2   Sod Picosulfate-Mag Ox-Cit Acd (CLENPIQ) 10-3.5-12 MG-GM -GM/160ML SOLN Take 1 kit by mouth as directed. At 5 PM evening before procedure, drink 1 bottle of Clenpiq, hydrate, drink (5) 8 oz of water. Then do the same thing 5 hours prior to your procedure. (Patient not taking: Reported on 01/23/2021) 320 mL 0   No current facility-administered medications for this visit.      Marland Kitchen  PHYSICAL EXAMINATION: ECOG PERFORMANCE STATUS: 1  - Symptomatic but completely ambulatory  Vitals:   01/23/21 1018  BP: (!) 161/91  Pulse: 86  Resp: 18  Temp: (!) 97.4 F (36.3 C)   Filed Weights   01/23/21 1018  Weight: 181 lb 3.2 oz (82.2 kg)    Physical Exam HENT:     Head: Normocephalic and atraumatic.     Mouth/Throat:     Pharynx: No oropharyngeal exudate.  Eyes:     Pupils: Pupils are equal, round, and reactive to light.  Cardiovascular:     Rate and Rhythm: Normal rate and regular rhythm.  Pulmonary:     Effort: Pulmonary effort is normal. No respiratory distress.     Breath sounds: Normal breath sounds. No wheezing.  Abdominal:     General: Bowel sounds are normal. There is no distension.  Palpations: Abdomen is soft. There is no mass.     Tenderness: There is no abdominal tenderness. There is no guarding or rebound.  Musculoskeletal:        General: No tenderness. Normal range of motion.     Cervical back: Normal range of motion and neck supple.  Skin:    General: Skin is warm.  Neurological:     Mental Status: She is alert and oriented to person, place, and time.  Psychiatric:        Mood and Affect: Affect normal.     LABORATORY DATA:  I have reviewed the data as listed Lab Results  Component Value Date   WBC 6.5 01/23/2021   HGB 10.1 (L) 01/23/2021   HCT 32.1 (L) 01/23/2021   MCV 77.0 (L) 01/23/2021   PLT 273 01/23/2021   Recent Labs    09/05/20 0957 10/23/20 1031 10/24/20 0917 01/23/21 1005  NA 138  --  136 136  K 3.7  --  3.4* 3.5  CL 102  --  102 103  CO2 24  --  25 26  GLUCOSE 127*  --  100* 137*  BUN 9  --  11 13  CREATININE 0.75 0.80 0.72 0.78  CALCIUM 9.0  --  8.9 9.0  GFRNONAA >60  --  >60 >60  PROT 7.8  --  7.7 8.1  ALBUMIN 3.5  --  3.5 3.8  AST 39  --  40 33  ALT 29  --  30 33  ALKPHOS 160*  --  212* 191*  BILITOT 0.4  --  0.3 0.6    RADIOGRAPHIC STUDIES: I have personally reviewed the radiological images as listed and agreed with the findings in the report. No  results found.  ASSESSMENT & PLAN:   Cancer of head of pancreas ([place]) #  Pancreas adenocarcinoma-Stage IB-/boderline resectable [Abutment of the SMV s/p neoadjuvant FOLFIRINX. #10 cycles]-s/p Whipple's [on April 19th 2022]- ypT2 [3.5cm]; pN0/11; # DIS-CONTINUE  FOLFIRINOX cycle #11 & 12-  Sec to PN-2-3. July 28th 2022-CT scan abdomen pelvis does not show any evidence of recurrence.Will repeat imaging in 3 months.   # PN--1-2- on cymbalta- STABLE.   # Elevated Alk Phos- 191/ improved from 212- in July 2022- CT NEG. Monitor for now.   # Hypertension-poorly controlled 161/91]; Continue Norvasc/Metoprlol.[ per pt At home- does not check];AGAIN RE-ITERATED to bring log.   # mediport:  continue port flushes q 29M. discussed re: pro and cons of keeping the port vs. Explantation.   We will keep it for now.- STABLE  # DISPOSITION:  # Follow up in 3 months- MD; labs; cbc/cmp/ca-19-9; port flush; CT CAP prior- Dr.B   All questions were answered. The patient knows to call the clinic with any problems, questions or concerns.    Cammie Sickle, MD 01/23/2021 10:39 AM

## 2021-01-23 NOTE — Progress Notes (Signed)
Survivorship Care Plan visit completed.  Treatment summary reviewed and given to patient.  ASCO answers booklet reviewed and given to patient.  CARE program and Cancer Transitions discussed with patient along with other resources cancer center offers to patients and caregivers.  Patient verbalized understanding.    

## 2021-01-23 NOTE — Progress Notes (Signed)
Patient takes Gabapentin on as needed basis.

## 2021-01-23 NOTE — Progress Notes (Signed)
Nutrition Follow-up:  Patient with pancreatic cancer, s/p neoadjuvant chemotherapy and whipple on 4/19 with J-tube placement.  J-tube has been removed.   Met with patient in clinic after MD.  Patient reports that her appetite is good, too good.  Eating for breakfast sometimes pancakes, egg with cheese toast, french toast.  Lunch maybe sandwich (tuna salad). Supper last night was [employer], french fries (1/2) .  No longer drinking oral nutrition supplements  Denies any nutrition impact symptoms    Medications: reviewed  Labs: reviewed  Anthropometrics:   Weight 181 lb 3.2 oz today  171 lb on 7/29  173 lb 12.8 oz on 6/10 183 lb on 5/13 186 lb on 3/29 prior to surgery   NUTRITION DIAGNOSIS: Inadequate oral intake improved   INTERVENTION:  Encouraged well balanced diet including lean protein foods and plant foods that patient can tolerate with GI surgery.   Agree with holding off on oral nutrition supplements at this time as intake has improved along with weight.  Contact information given to patient and encouraged to call RD if needed     NEXT VISIT: no follow-up  Available if needed  Lylie Blacklock B. Zenia Resides, [place], [place] Registered Dietitian [phone] (mobile)'

## 2021-01-23 NOTE — Assessment & Plan Note (Addendum)
#  Pancreas adenocarcinoma-Stage IB-/boderline resectable [Abutment of the SMV s/p neoadjuvant FOLFIRINX. #10 cycles]-s/p Whipple's [on April 19th 2022]- ypT2 [3.5cm]; pN0/11; # DIS-CONTINUE  FOLFIRINOX cycle #11 & 12-  Sec to PN-2-3. July 28th 2022-CT scan abdomen pelvis does not show any evidence of recurrence.Will repeat imaging in 3 months.   # PN--1-2- on cymbalta- STABLE.   # Elevated Alk Phos- 191/ improved from 212- in July 2022- CT NEG. Monitor for now.   # Hypertension-poorly controlled 161/91]; Continue Norvasc/Metoprlol.[ per pt At home- does not check];AGAIN RE-ITERATED to bring log.   # mediport:  continue port flushes q 52M. discussed re: pro and cons of keeping the port vs. Explantation.   We will keep it for now.- STABLE  # DISPOSITION:  # Follow up in 3 months- MD; labs; cbc/cmp/ca-19-9; port flush; CT CAP prior- Dr.B

## 2021-01-24 LAB — CANCER ANTIGEN 19-9: CA 19-9: 16 U/mL (ref 0–35)

## 2021-04-20 ENCOUNTER — Inpatient Hospital Stay: Payer: Medicare Other | Attending: Internal Medicine

## 2021-04-20 ENCOUNTER — Other Ambulatory Visit: Payer: Self-pay

## 2021-04-20 ENCOUNTER — Ambulatory Visit
Admission: RE | Admit: 2021-04-20 | Discharge: 2021-04-20 | Disposition: A | Discharge status: Home / Self Care | Payer: Medicare Other | Source: Ambulatory Visit | Attending: Internal Medicine | Admitting: Internal Medicine

## 2021-04-20 DIAGNOSIS — C250 Malignant neoplasm of head of pancreas: Secondary | ICD-10-CM | POA: Insufficient documentation

## 2021-04-20 DIAGNOSIS — G629 Polyneuropathy, unspecified: Secondary | ICD-10-CM | POA: Diagnosis not present

## 2021-04-20 DIAGNOSIS — I10 Essential (primary) hypertension: Secondary | ICD-10-CM | POA: Insufficient documentation

## 2021-04-20 DIAGNOSIS — Z452 Encounter for adjustment and management of vascular access device: Secondary | ICD-10-CM | POA: Diagnosis not present

## 2021-04-20 LAB — COMPREHENSIVE METABOLIC PANEL
ALT: 21 U/L (ref 0–44)
AST: 23 U/L (ref 15–41)
Albumin: 3.9 g/dL (ref 3.5–5.0)
Alkaline Phosphatase: 131 U/L — ABNORMAL HIGH (ref 38–126)
Anion gap: 9 (ref 5–15)
BUN: 12 mg/dL (ref 8–23)
CO2: 27 mmol/L (ref 22–32)
Calcium: 9.3 mg/dL (ref 8.9–10.3)
Chloride: 101 mmol/L (ref 98–111)
Creatinine, Ser: 0.63 mg/dL (ref 0.44–1.00)
GFR, Estimated: >60 mL/min (ref >60)
Glucose, Bld: 107 mg/dL — ABNORMAL HIGH (ref 70–99)
Potassium: 3.8 mmol/L (ref 3.5–5.1)
Sodium: 137 mmol/L (ref 135–145)
Total Bilirubin: 0.4 mg/dL (ref 0.3–1.2)
Total Protein: 8.2 g/dL — ABNORMAL HIGH (ref 6.5–8.1)

## 2021-04-20 LAB — CBC WITH DIFFERENTIAL/PLATELET
Abs Immature Granulocytes: 0 10*3/uL (ref 0.00–0.07)
Basophils Absolute: 0 10*3/uL (ref 0.0–0.1)
Basophils Relative: 0 %
Eosinophils Absolute: 0.1 10*3/uL (ref 0.0–0.5)
Eosinophils Relative: 2 %
HCT: 32.5 % — ABNORMAL LOW (ref 36.0–46.0)
Hemoglobin: 10.3 g/dL — ABNORMAL LOW (ref 12.0–15.0)
Immature Granulocytes: 0 %
Lymphocytes Relative: 41 %
Lymphs Abs: 2 10*3/uL (ref 0.7–4.0)
MCH: 24 pg — ABNORMAL LOW (ref 26.0–34.0)
MCHC: 31.7 g/dL (ref 30.0–36.0)
MCV: 75.6 fL — ABNORMAL LOW (ref 80.0–100.0)
Monocytes Absolute: 0.3 10*3/uL (ref 0.1–1.0)
Monocytes Relative: 7 %
Neutro Abs: 2.4 10*3/uL (ref 1.7–7.7)
Neutrophils Relative %: 50 %
Platelets: 291 10*3/uL (ref 150–400)
RBC: 4.3 MIL/uL (ref 3.87–5.11)
RDW: 16.4 % — ABNORMAL HIGH (ref 11.5–15.5)
WBC: 4.8 10*3/uL (ref 4.0–10.5)
nRBC: 0 % (ref 0.0–0.2)

## 2021-04-20 MED ORDER — HEPARIN SOD (PORK) LOCK FLUSH 100 UNIT/ML IV SOLN
500.0000 [IU] | Freq: Once | INTRAVENOUS | Status: AC
  Administered 2021-04-20: 500 [IU] via INTRAVENOUS
  Filled 2021-04-20: qty 5

## 2021-04-20 MED ORDER — IOHEXOL 300 MG/ML  SOLN
100.0000 mL | Freq: Once | INTRAMUSCULAR | Status: AC | PRN
  Administered 2021-04-20: 100 mL via INTRAVENOUS

## 2021-04-21 LAB — CANCER ANTIGEN 19-9: CA 19-9: 13 U/mL (ref 0–35)

## 2021-04-27 ENCOUNTER — Other Ambulatory Visit: Payer: Medicare Other

## 2021-04-27 ENCOUNTER — Other Ambulatory Visit: Payer: Self-pay

## 2021-04-27 ENCOUNTER — Inpatient Hospital Stay (HOSPITAL_BASED_OUTPATIENT_CLINIC_OR_DEPARTMENT_OTHER): Payer: Medicare Other | Admitting: Internal Medicine

## 2021-04-27 ENCOUNTER — Encounter: Payer: Self-pay | Admitting: Internal Medicine

## 2021-04-27 DIAGNOSIS — C250 Malignant neoplasm of head of pancreas: Secondary | ICD-10-CM

## 2021-04-27 NOTE — Progress Notes (Signed)
[facility] NOTE  Patient Care Team: [place] as PCP - General [name], [place], FNP (Family Medicine) Rico Junker, RN as Registered Nurse Theodore Demark, RN as Registered Nurse Clent Jacks, RN as Oncology Nurse Navigator Stark Klein, MD as Consulting Physician (General Surgery) Cammie Sickle, MD as Consulting Physician (Internal Medicine)  CHIEF COMPLAINTS/PURPOSE OF CONSULTATION: pancreas adenocarcinoma   Oncology History Overview Note  #Pancreas adenocarcinoma-Stage IB- [id number]-[EUS- Dr.Spaete; [facility]/GI; mass 22x86m; invading/abutting superior mesenteric vein; 2 enlarged lymph nodes peripancreatic/porta hepatis largest 10 x 5.8 mm-nonpathologic based on EUS criteria/no biopsy]-borderline resectable.  PET scan no evidence of distant metastatic disease.  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 BStevan BornSeptember]  # 01/02/2020-FOLFIRINOX; Neulasta. S/p FOLFIRINOX 10 cycles- April 19th whipples-  s/p neoadjuvant FOLFIRINX. #10 cycles]-s/p Whipple's [on April 19th 2022]- ypT2 [3.5cm]; pN0/11;   DIS-CONTINUED  FOLFIRINOX cycle #11 & 12-  Sec to PN-2-3.  PANCREAS (EXOCRINE), CARCINOMA: Resection  Procedure: Whipple procedure  Tumor Site: Head of pancreas.  Tumor Size: 3.5 cm, slide measurement.  Histologic Type: Pancreatic ductal adenocarcinoma.  Histologic Grade: Moderately differentiated.  Tumor Extension: Into peripancreatic connective tissue.  Treatment Effect: Moderate treatment effect.  Lymphovascular Invasion: Not identified.  Perineural Invasion: Present.  Margins: All surgical margins are negative for carcinoma.  Regional Lymph Nodes:       Number of Lymph Nodes with Tumor: 0       Number of Lymph Nodes Examined: 15  Distant Metastasis:       Distant Site(s) Involved: Not  applicable.  Pathologic Stage Classification (pTNM, AJCC 8th Edition): ypT2, ypN0   # Borderline DM; HTN   # SURVIVORSHIP:   # GENETICS:   DIAGNOSIS: Pancreatic cancer  STAGE:  IB/borderline resectable;  GOALS: cure    Cancer of head of pancreas ([place]  12/04/2019 Initial Diagnosis   [place] head of pancreas [place]   12/19/2019 - 12/19/2019 Chemotherapy   The patient had PACLitaxel-protein bound (ABRAXANE) chemo infusion 250 mg, 125 mg/m2 = 250 mg, Intravenous,  Once, 1 of 4 cycles Administration: 250 mg (12/19/2019) gemcitabine (GEMZAR) 2,000 mg in sodium chloride 0.9 % 250 mL chemo infusion, 1,976 mg, Intravenous,  Once, 1 of 4 cycles Administration: 2,000 mg (12/19/2019)   for chemotherapy treatment.     01/02/2020 -  Chemotherapy    Patient is on Treatment Plan: PANCREAS MODIFIED FOLFIRINOX Q14D X 4 CYCLES        HISTORY OF PRESENTING ILLNESS:  Robin AULDS89y.o.  female borderline resectable pancreatic adeno ca currently s/p neoadjuvant chemotherapy with FOLFIRINOX x10 cycles-status post Whipple's on July 15, 2020  is here for follow-up-review of the CT scan.  Patient denies any worsening nausea or vomiting or abdominal pain.  She continues to have intermittent mild abdominal discomfort which is mostly positional.  She does not need any pain medications.  Admits to improvement of her neuropathy; not complete resolve.  She continues to take Cymbalta.   Review of Systems  Constitutional:  Negative for chills, diaphoresis, fever and malaise/fatigue.  HENT:  Negative for nosebleeds and sore throat.   Eyes:  Negative for double vision.  Respiratory:  Negative for cough, hemoptysis, sputum production, shortness of breath and wheezing.   Cardiovascular:  Negative for chest pain, palpitations, orthopnea and leg swelling.  Gastrointestinal:  Negative for abdominal pain, blood in stool, constipation, diarrhea,  heartburn, melena, nausea and vomiting.  Genitourinary:  Negative  for dysuria, frequency and urgency.  Musculoskeletal:  Negative for back pain and joint pain.  Skin: Negative.  Negative for itching and rash.  Neurological:  Positive for tingling. Negative for dizziness, focal weakness, weakness and headaches.  Endo/Heme/Allergies:  Does not bruise/bleed easily.  Psychiatric/Behavioral:  Negative for depression. The patient is not nervous/anxious and does not have insomnia.     MEDICAL HISTORY:  Past Medical History:  Diagnosis Date   Anemia    history of   Anxiety 09/11/2014   Arthritis    Cancer of head of pancreas ([place]) 0/04/5850   Complication of anesthesia    Fluttering heart    Hyperlipidemia    Hypertension    PONV (postoperative nausea and vomiting)    Pre-diabetes     SURGICAL HISTORY: Past Surgical History:  Procedure Laterality Date   COLONOSCOPY WITH PROPOFOL N/A 01/13/2021   Procedure: COLONOSCOPY WITH PROPOFOL;  Surgeon: Jonathon Bellows, MD;  Location[facility] ENDOSCOPY;  Service: Gastroenterology;  Laterality: N/A;   ENDOSCOPIC RETROGRADE CHOLANGIOPANCREATOGRAPHY (ERCP) WITH PROPOFOL N/A 11/16/2019   Procedure: ENDOSCOPIC RETROGRADE CHOLANGIOPANCREATOGRAPHY (ERCP) WITH PROPOFOL;  Surgeon: Lucilla Lame, MD;  Location: [facility] ENDOSCOPY;  Service: Endoscopy;  Laterality: N/A;   ERCP N/A 03/27/2020   Procedure: ENDOSCOPIC RETROGRADE CHOLANGIOPANCREATOGRAPHY (ERCP);  Surgeon: Lucilla Lame, MD;  Location: [facility] ENDOSCOPY;  Service: Endoscopy;  Laterality: N/A;   EUS N/A 11/29/2019   Procedure: FULL UPPER ENDOSCOPIC ULTRASOUND (EUS) RADIAL;  Surgeon: Reita Cliche, MD;  Location: [facility] ENDOSCOPY;  Service: Gastroenterology;  Laterality: N/A;   IR FLUORO RM 30-60 MIN  07/30/2020   IR IMAGING GUIDED PORT INSERTION  12/14/2019   JEJUNOSTOMY Left 07/15/2020   Procedure: Shanon Rosser;  Surgeon: Stark Klein, MD;  Location: [place];  Service: General;  Laterality: Left;   LAPAROSCOPY N/A 07/15/2020   Procedure: DIAGNOSTIC LAPAROSCOPY;  Surgeon: Stark Klein,  MD;  Location: [place];  Service: General;  Laterality: N/A;   right knee replacement Right [id number]   SHOULDER ARTHROSCOPY W/ ROTATOR CUFF REPAIR Right    WHIPPLE PROCEDURE N/A 07/15/2020   Procedure: WHIPPLE PROCEDURE;  Surgeon: Stark Klein, MD;  Location: [place];  Service: General;  Laterality: N/A;    SOCIAL HISTORY: Social History   Socioeconomic History   Marital status: Divorced    Spouse name: Not on file   Number of children: Not on file   Years of education: Not on file   Highest education level: Not on file  Occupational History   Not on file  Tobacco Use   Smoking status: Never   Smokeless tobacco: Never  Vaping Use   Vaping Use: Never used  Substance and Sexual Activity   Alcohol use: No    Alcohol/week: 0.0 standard drinks   Drug use: No   Sexual activity: Never  Other Topics Concern   Not on file  Social History Narrative   ** Merged History Encounter **       [place]; with mom and son; worked in dietary; never smoked; no alcohol.    Social Determinants of Health   Financial Resource Strain: Not on file  Food Insecurity: Not on file  Transportation Needs: Not on file  Physical Activity: Not on file  Stress: Not on file  Social Connections: Not on file  Intimate Partner Violence: Not on file    FAMILY HISTORY: Family History  Problem Relation Age of Onset   Diabetes Mother    Hyperlipidemia Mother  Hypertension Mother    Vision loss Mother    Arthritis Father    Hyperlipidemia Father    Hypertension Father    Breast cancer Maternal Aunt     ALLERGIES:  has No Known Allergies.  MEDICATIONS:  Current Outpatient Medications  Medication Sig Dispense Refill   amLODipine (NORVASC) 10 MG tablet Take 1 tablet (10 mg total) by mouth daily. 30 tablet 0   lidocaine-prilocaine (EMLA) cream Apply 1 application topically daily as needed (numbing).     metoprolol succinate (TOPROL-XL) 50 MG 24 hr tablet Take 1 tablet (50 mg total) by mouth  daily. Take with or immediately following a meal. 30 tablet 0   Potassium Chloride ER 20 MEQ TBCR Take 20 mEq by mouth in the morning and at bedtime. 60 tablet 1   vitamin B-12 (CYANOCOBALAMIN) 1000 MCG tablet Take 1 tablet (1,000 mcg total) by mouth daily.     acetaminophen (TYLENOL) 325 MG tablet Take 650 mg by mouth every 6 (six) hours as needed for moderate pain. (Patient not taking: Reported on 08/08/2020)     diphenoxylate-atropine (LOMOTIL) 2.5-0.025 MG tablet Take 1 tablet by mouth 4 (four) times daily as needed for diarrhea or loose stools. Take it along with immodium (Patient not taking: Reported on 08/08/2020) 45 tablet 0   DULoxetine (CYMBALTA) 20 MG capsule Take 1 capsule (20 mg total) by mouth daily. (Patient not taking: Reported on 09/05/2020) 30 capsule 3   feeding supplement (ENSURE ENLIVE / ENSURE PLUS) LIQD Take 237 mLs by mouth 2 (two) times daily between meals. (Patient not taking: Reported on 01/23/2021) 237 mL 12   gabapentin (NEURONTIN) 100 MG capsule Take 1 capsule (100 mg total) by mouth 3 (three) times daily. (Patient not taking: Reported on 04/27/2021) 90 capsule 2   ondansetron (ZOFRAN-ODT) 4 MG disintegrating tablet Take 1 tablet (4 mg total) by mouth every 6 (six) hours as needed for nausea. (Patient not taking: Reported on 08/22/2020) 20 tablet 0   oxyCODONE (OXY IR/ROXICODONE) 5 MG immediate release tablet Take 1 tablet (5 mg total) by mouth every 4 (four) hours as needed for moderate pain. (Patient not taking: Reported on 08/08/2020) 5 tablet 0   pantoprazole (PROTONIX) 40 MG tablet Take 1 tablet (40 mg total) by mouth at bedtime. (Patient not taking: Reported on 10/24/2020) 30 tablet 2   Sod Picosulfate-Mag Ox-Cit Acd (CLENPIQ) 10-3.5-12 MG-GM -GM/160ML SOLN Take 1 kit by mouth as directed. At 5 PM evening before procedure, drink 1 bottle of Clenpiq, hydrate, drink (5) 8 oz of water. Then do the same thing 5 hours prior to your procedure. (Patient not taking: Reported on  01/23/2021) 320 mL 0   No current facility-administered medications for this visit.      Marland Kitchen  PHYSICAL EXAMINATION: ECOG PERFORMANCE STATUS: 1 - Symptomatic but completely ambulatory  Vitals:   04/27/21 1043  BP: (!) 201/85  Pulse: (!) 103  Temp: (!) 97.2 F (36.2 C)  SpO2: 100%   Filed Weights   04/27/21 1043  Weight: 187 lb (84.8 kg)    Physical Exam HENT:     Head: Normocephalic and atraumatic.     Mouth/Throat:     Pharynx: No oropharyngeal exudate.  Eyes:     Pupils: Pupils are equal, round, and reactive to light.  Cardiovascular:     Rate and Rhythm: Normal rate and regular rhythm.  Pulmonary:     Effort: Pulmonary effort is normal. No respiratory distress.     Breath sounds: Normal breath  sounds. No wheezing.  Abdominal:     General: Bowel sounds are normal. There is no distension.     Palpations: Abdomen is soft. There is no mass.     Tenderness: There is no abdominal tenderness. There is no guarding or rebound.  Musculoskeletal:        General: No tenderness. Normal range of motion.     Cervical back: Normal range of motion and neck supple.  Skin:    General: Skin is warm.  Neurological:     Mental Status: She is alert and oriented to person, place, and time.  Psychiatric:        Mood and Affect: Affect normal.     LABORATORY DATA:  I have reviewed the data as listed Lab Results  Component Value Date   WBC 4.8 04/20/2021   HGB 10.3 (L) 04/20/2021   HCT 32.5 (L) 04/20/2021   MCV 75.6 (L) 04/20/2021   PLT 291 04/20/2021   Recent Labs    10/24/20 0917 01/23/21 1005 04/20/21 0919  NA 136 136 137  K 3.4* 3.5 3.8  CL 102 103 101  CO2 _0 GLUCOSE 100* 137* 107*  BUN _1 CREATININE 0.72 0.78 0.63  CALCIUM 8.9 9.0 9.3  GFRNONAA >60 >60 >60  PROT 7.7 8.1 8.2*  ALBUMIN 3.5 3.8 3.9  AST 40 33 23  ALT 30 33 21  ALKPHOS 212* 191* 131*  BILITOT 0.3 0.6 0.4    RADIOGRAPHIC STUDIES: I have personally reviewed the radiological  images as listed and agreed with the findings in the report. CT CHEST ABDOMEN PELVIS W CONTRAST  Result Date: 04/20/2021 CLINICAL DATA:  History of pancreatic adenocarcinoma, status post Whipple procedure. Restaging. EXAM: CT CHEST, ABDOMEN, AND PELVIS WITH CONTRAST TECHNIQUE: Multidetector CT imaging of the chest, abdomen and pelvis was performed following the standard protocol during bolus administration of intravenous contrast. RADIATION DOSE REDUCTION: This exam was performed according to the departmental dose-optimization program which includes automated exposure control, adjustment of the mA and/or kV according to patient size and/or use of iterative reconstruction technique. CONTRAST:  134m OMNIPAQUE IOHEXOL 300 MG/ML  SOLN COMPARISON:  Multiple priors including most recent CT abdomen October 23, 2020 FINDINGS: CT CHEST FINDINGS Cardiovascular: Accessed right chest Port-A-Cath with tip at the superior cavoatrial junction. Aortic and branch vessel atherosclerosis without aneurysmal dilation. No central pulmonary embolus on this nondedicated study. LAD and left circumflex coronary artery calcifications. Normal size heart. No significant pericardial effusion/thickening. Mediastinum/Nodes: No supraclavicular adenopathy. No discrete thyroid nodule. No pathologically enlarged mediastinal, hilar or axillary lymph nodes. The trachea and esophagus are grossly unremarkable. Lungs/Pleura: No suspicious pulmonary nodules or masses. No focal consolidation. No pleural effusion. No pneumothorax. Similar appearance of the incidental benign right subpleural lipoma. Musculoskeletal: No chest wall mass or suspicious bone lesions identified. CT ABDOMEN PELVIS FINDINGS Hepatobiliary: No suspicious hepatic lesion. Pneumobilia similar prior. Likely retrograde contrast filling of the pancreaticobiliary limb of small-bowel by oral contrast which progresses retrograde from the choledochoenterostomy into the biliary tree. No  intrahepatic or common bile duct dilation. New cystic area near the porta hepatis along the common bile duct measuring 11 x 7 x 9 mm on images 56/2 and 54/5, nonspecific with most likely differential considerations including a dilated cystic duct or walled off fluid collection. No evidence of acute inflammation in this area. Pancreas: Surgical changes of prior Whipple procedure with a thin pancreatic stent present in the pancreatic body and extending towards the pancreaticojejunostomy. Similar  indistinct appearance of the tissue planes in the surgical bed but no definite residual mass visualized. No pancreatic ductal dilation. Spleen: Normal in size without focal abnormality. Adrenals/Urinary Tract: Bilateral adrenal glands appear normal. No hydronephrosis. Symmetric enhancement and excretion of contrast material from the kidneys. No solid enhancing renal lesion. Urinary bladder is unremarkable for degree of distension. Stomach/Bowel: Radiopaque enteric contrast material traverses the rectum. Stomach is unremarkable for degree of distension. Patent gastrojejunostomy. Normal appendix. Colonic diverticulosis without findings of acute diverticulitis. Vascular/Lymphatic: Aortic and branch vessel atherosclerosis without aneurysmal dilation. Similar appearance of the small regional mesenteric lymph nodes measuring up to 7 mm in short axis on image 68/2. No pathologically enlarged abdominal or pelvic lymph nodes. Reproductive: Uterus and bilateral adnexa are unremarkable. Other: No significant abdominopelvic free fluid. No discrete peritoneal or omental nodularity. Postsurgical change in the abdominal wall. Musculoskeletal: Multilevel degenerative changes spine. No aggressive lytic or blastic lesion of bone. IMPRESSION: 1. Surgical changes of prior Whipple procedure without evidence of visible recurrence/residual tumor. 2. No definite evidence of new or progressive disease in the chest, abdomen or pelvis. 3. Similar  appearance of the small regional mesenteric lymph nodes measuring up to 7 mm in short axis. Nonspecific continued attention on follow-up imaging suggested. 4. New cystic area near the porta hepatis along the common bile duct measuring 11 x 7 x 9 mm, no intrahepatic biliary ductal, CBD or pancreatic ductal dilation or evidence of adjacent inflammation, most likely differential considerations including a dilated cystic duct or walled off fluid collection. Consider correlation for change in patient's symptomatology including new onset pain as well as laboratory values for obstruction and short-term interval follow-up imaging to assess stability if clinically indicated. 5. Colonic diverticulosis without findings of acute diverticulitis. 6.  Aortic Atherosclerosis (ICD10-I70.0). Electronically Signed   By: Dahlia Bailiff M.D.   On: 04/20/2021 16:06    ASSESSMENT & PLAN:   Cancer of head of pancreas ([place]) #  Pancreas adenocarcinoma-Stage IB-/boderline resectable [Abutment of the SMV s/p neoadjuvant FOLFIRINX. #10 cycles]-s/p Whipple's [on April 19th 2022]- ypT2 [3.5cm]; pN0/11;Apr 20, 2021- CT scan abdomen pelvis does not show any evidence of recurrenc; small regional mesenteric lymph nodes measuring up to 7 mm in short axis.Will repeat imaging in 3-4 months.  Will order imaging at next visit.  # Incidental- CT [jan 2023-] New cystic area near the porta hepatis along the common bile duct measuring 11 x 7 x 9 mm, no intrahepatic biliary ductal, CBD or pancreatic ductal dilation or evidence of adjacent inflammation, most likely differential considerations including a dilated cystic duct or walled off fluid collection.  Patient asymptomatic.  LFTs normal-except for mild intermittent elevated alkaline phosphatase and below.  We will repeat imaging in 4 months.  # Anemia- microcytic- Stable 9-10; add iron studies/ferritin today;  colo [EUS- 2021; oct 2022- colo; Dr.Anna];    # PN--1-2- on cymbalta- STABLE.   #  Elevated Alk Phos- 131/ improved from 212- in JAN 2023- CT NEG. Monitor for now.   # Hypertension-poorly controlled 201/91]; Continue Norvasc/Metoprlol.[ per pt At home- does not check] ;AGAIN RE-ITERATED to bring log.   # mediport:  continue port flushes q 63M. discussed re: pro and cons of keeping the port vs. Explantation.   We will keep it for now.- STABLE  # #Incidental findings on CT Imaging dated: 23, 2023: Atherosclerosis/diverticulosis I reviewed/discussed/counseled the patient.   # DISPOSITION:  # add iron studies/ferritin today # Follow up in 2 months- MD; labs; cbc/cmp/ca-19-9; port flush;Dr.B  #  I reviewed the blood work- with the patient in detail; also reviewed the imaging independently [as summarized above]; and with the patient in detail.      All questions were answered. The patient knows to call the clinic with any problems, questions or concerns.    Cammie Sickle, MD 04/27/2021 11:15 AM

## 2021-04-27 NOTE — Progress Notes (Signed)
Results of CT scan.  Can we order/sch mammogram?

## 2021-04-27 NOTE — Patient Instructions (Signed)
#  Please check your blood pressure at home; if still elevated-reach out to PCP for further instructions.  Bring the log of blood pressures at next visit for our review.

## 2021-04-27 NOTE — Assessment & Plan Note (Addendum)
#  Pancreas adenocarcinoma-Stage IB-/boderline resectable [Abutment of the SMV s/p neoadjuvant FOLFIRINX. #10 cycles]-s/p Whipple's [on April 19th 2022]- ypT2 [3.5cm]; pN0/11;Apr 20, 2021- CT scan abdomen pelvis does not show any evidence of recurrenc; small regional mesenteric lymph nodes measuring up to 7 mm in short axis.Will repeat imaging in 3-4 months.  Will order imaging at next visit.  # Incidental- CT [jan 2023-] New cystic area near the porta hepatis along the common bile duct measuring 11 x 7 x 9 mm, no intrahepatic biliary ductal, CBD or pancreatic ductal dilation or evidence of adjacent inflammation, most likely differential considerations including a dilated cystic duct or walled off fluid collection.  Patient asymptomatic.  LFTs normal-except for mild intermittent elevated alkaline phosphatase and below.  We will repeat imaging in 4 months.  # Anemia- microcytic- Stable 9-10; add iron studies/ferritin today;  colo [EUS- 2021; oct 2022- colo; Dr.Anna];   # PN--1-2- on cymbalta- STABLE.   # Elevated Alk Phos- 131/ improved from 212- in JAN 2023- CT NEG. Monitor for now.   # Hypertension-poorly controlled 201/91]; Continue Norvasc/Metoprlol.[ per pt At home- does not check] ;AGAIN RE-ITERATED to bring log.   # mediport:  continue port flushes q 79M. discussed re: pro and cons of keeping the port vs. Explantation.   We will keep it for now.- STABLE  # #Incidental findings on CT Imaging dated: 23, 2023: Atherosclerosis/diverticulosis I reviewed/discussed/counseled the patient.   # DISPOSITION:  # add iron studies/ferritin today # Follow up in 2 months- MD; labs; cbc/cmp/ca-19-9; port flush;Dr.B  # I reviewed the blood work- with the patient in detail; also reviewed the imaging independently [as summarized above]; and with the patient in detail.

## 2021-05-12 ENCOUNTER — Other Ambulatory Visit: Payer: Self-pay | Admitting: Physician Assistant

## 2021-05-12 ENCOUNTER — Other Ambulatory Visit: Payer: Self-pay | Admitting: Family Medicine

## 2021-05-12 DIAGNOSIS — Z1231 Encounter for screening mammogram for malignant neoplasm of breast: Secondary | ICD-10-CM

## 2021-05-12 DIAGNOSIS — Z13820 Encounter for screening for osteoporosis: Secondary | ICD-10-CM

## 2021-06-02 ENCOUNTER — Encounter: Payer: Self-pay | Admitting: Ophthalmology

## 2021-06-09 ENCOUNTER — Ambulatory Visit: Payer: Medicare Other | Admitting: Anesthesiology

## 2021-06-09 ENCOUNTER — Other Ambulatory Visit: Payer: Self-pay

## 2021-06-09 ENCOUNTER — Ambulatory Visit
Admission: RE | Admit: 2021-06-09 | Discharge: 2021-06-09 | Disposition: A | Discharge status: Home / Self Care | Payer: Medicare Other | Attending: Ophthalmology | Admitting: Ophthalmology

## 2021-06-09 ENCOUNTER — Encounter
Admission: RE | Disposition: A | Discharge status: Home / Self Care | Payer: Self-pay | Source: Home / Self Care | Attending: Ophthalmology

## 2021-06-09 DIAGNOSIS — F419 Anxiety disorder, unspecified: Secondary | ICD-10-CM | POA: Insufficient documentation

## 2021-06-09 DIAGNOSIS — I10 Essential (primary) hypertension: Secondary | ICD-10-CM | POA: Insufficient documentation

## 2021-06-09 DIAGNOSIS — H2511 Age-related nuclear cataract, right eye: Secondary | ICD-10-CM | POA: Insufficient documentation

## 2021-06-09 HISTORY — DX: Dizziness and giddiness: R42

## 2021-06-09 SURGERY — PHACOEMULSIFICATION, CATARACT, WITH IOL INSERTION
Anesthesia: Monitor Anesthesia Care | Site: Eye | Laterality: Right

## 2021-06-09 MED ORDER — TETRACAINE HCL 0.5 % OP SOLN
1.0000 [drp] | OPHTHALMIC | Status: DC | PRN
  Administered 2021-06-09 (×3): 1 [drp] via OPHTHALMIC

## 2021-06-09 MED ORDER — ACETAMINOPHEN 160 MG/5ML PO SOLN: 325.0000 mg | Freq: Once | ORAL | Status: DC

## 2021-06-09 MED ORDER — SIGHTPATH DOSE#1 BSS IO SOLN
INTRAOCULAR | Status: DC | PRN
  Administered 2021-06-09: 1 mL

## 2021-06-09 MED ORDER — FENTANYL CITRATE (PF) 100 MCG/2ML IJ SOLN
INTRAMUSCULAR | Status: DC | PRN
Start: 2021-06-09 — End: 2021-06-09
  Administered 2021-06-09: 50 ug via INTRAVENOUS

## 2021-06-09 MED ORDER — SIGHTPATH DOSE#1 BSS IO SOLN
INTRAOCULAR | Status: DC | PRN
  Administered 2021-06-09: 68 mL via OPHTHALMIC

## 2021-06-09 MED ORDER — ACETAMINOPHEN 325 MG PO TABS: 325.0000 mg | ORAL_TABLET | Freq: Once | ORAL | Status: DC

## 2021-06-09 MED ORDER — SIGHTPATH DOSE#1 BSS IO SOLN
INTRAOCULAR | Status: DC | PRN
  Administered 2021-06-09: 15 mL

## 2021-06-09 MED ORDER — MIDAZOLAM HCL 2 MG/2ML IJ SOLN
INTRAMUSCULAR | Status: DC | PRN
  Administered 2021-06-09: .5 mg via INTRAVENOUS
  Administered 2021-06-09: 1 mg via INTRAVENOUS

## 2021-06-09 MED ORDER — SIGHTPATH DOSE#1 NA CHONDROIT SULF-NA HYALURON 40-17 MG/ML IO SOLN
INTRAOCULAR | Status: DC | PRN
  Administered 2021-06-09: 1 mL via INTRAOCULAR

## 2021-06-09 MED ORDER — ARMC OPHTHALMIC DILATING DROPS
1.0000 "application " | OPHTHALMIC | Status: DC | PRN
  Administered 2021-06-09 (×3): 1 via OPHTHALMIC

## 2021-06-09 MED ORDER — MOXIFLOXACIN HCL 0.5 % OP SOLN
OPHTHALMIC | Status: DC | PRN
  Administered 2021-06-09: 0.2 mL via OPHTHALMIC

## 2021-06-09 MED ORDER — BRIMONIDINE TARTRATE-TIMOLOL 0.2-0.5 % OP SOLN
OPHTHALMIC | Status: DC | PRN
  Administered 2021-06-09: 1 [drp] via OPHTHALMIC

## 2021-06-09 MED ORDER — LACTATED RINGERS IV SOLN: INTRAVENOUS | Status: DC

## 2021-06-09 SURGICAL SUPPLY — 16 items
CANNULA ANT/CHMB 27G (MISCELLANEOUS) IMPLANT
CANNULA ANT/CHMB 27GA (MISCELLANEOUS) IMPLANT
CATARACT SUITE SIGHTPATH (MISCELLANEOUS) ×2 IMPLANT
FEE CATARACT SUITE SIGHTPATH (MISCELLANEOUS) ×1 IMPLANT
GLOVE SURG ENC TEXT LTX SZ8 (GLOVE) ×2 IMPLANT
GLOVE SURG TRIUMPH 8.0 PF LTX (GLOVE) ×2 IMPLANT
LENS IOL TECNIS EYHANCE 18.5 (Intraocular Lens) ×1 IMPLANT
NDL FILTER BLUNT 18X1 1/2 (NEEDLE) ×1 IMPLANT
NEEDLE FILTER BLUNT 18X 1/2SAF (NEEDLE) ×1
NEEDLE FILTER BLUNT 18X1 1/2 (NEEDLE) ×1 IMPLANT
PACK VIT ANT 23G (MISCELLANEOUS) IMPLANT
RING MALYGIN (MISCELLANEOUS) ×1 IMPLANT
SUT ETHILON 10-0 CS-B-6CS-B-6 (SUTURE)
SUTURE EHLN 10-0 CS-B-6CS-B-6 (SUTURE) IMPLANT
SYR 3ML LL SCALE MARK (SYRINGE) ×2 IMPLANT
WATER STERILE IRR 250ML POUR (IV SOLUTION) ×2 IMPLANT

## 2021-06-09 NOTE — Anesthesia Preprocedure Evaluation (Signed)
Anesthesia Evaluation  ?Patient identified by MRN, date of birth, ID band ?Patient awake ? ? ? ?Reviewed: ?Allergy & Precautions, H&P , NPO status , Patient's Chart, lab work & pertinent test results ? ?History of Anesthesia Complications ?(+) PONV and history of anesthetic complications ? ?Airway ?Mallampati: II ? ?TM Distance: >3 FB ?Neck ROM: full ? ? ? Dental ?no notable dental hx. ? ?  ?Pulmonary ? ?  ?Pulmonary exam normal ?breath sounds clear to auscultation ? ? ? ? ? ? Cardiovascular ?hypertension, Normal cardiovascular exam ?Rhythm:regular Rate:Normal ? ? ?  ?Neuro/Psych ?Anxiety   ? GI/Hepatic ?  ?Endo/Other  ? ? Renal/GU ?  ? ?  ?Musculoskeletal ? ? Abdominal ?  ?Peds ? Hematology ?  ?Anesthesia Other Findings ? ? Reproductive/Obstetrics ? ?  ? ? ? ? ? ? ? ? ? ? ? ? ? ?  ?  ? ? ? ? ? ? ? ? ?Anesthesia Physical ?Anesthesia Plan ? ?ASA: 2 ? ?Anesthesia Plan: MAC  ? ?Post-op Pain Management: Minimal or no pain anticipated  ? ?Induction:  ? ?PONV Risk Score and Plan: 3 and Treatment may vary due to age or medical condition, TIVA and Midazolam ? ?Airway Management Planned:  ? ?Additional Equipment:  ? ?Intra-op Plan:  ? ?Post-operative Plan:  ? ?Informed Consent: I have reviewed the patients History and Physical, chart, labs and discussed the procedure including the risks, benefits and alternatives for the proposed anesthesia with the patient or authorized representative who has indicated his/her understanding and acceptance.  ? ? ? ?Dental Advisory Given ? ?Plan Discussed with: CRNA ? ?Anesthesia Plan Comments:   ? ? ? ? ? ? ?Anesthesia Quick Evaluation ? ?

## 2021-06-09 NOTE — Anesthesia Postprocedure Evaluation (Signed)
Anesthesia Post Note ? ?Patient: Robin Daniels ? ?Procedure(s) Performed: CATARACT EXTRACTION PHACO AND INTRAOCULAR LENS PLACEMENT (IOC) RIGHT malyugin (Right: Eye) ? ? ?  ?Patient location during evaluation: PACU ?Anesthesia Type: MAC ?Level of consciousness: awake and alert and oriented ?Pain management: satisfactory to patient ?Vital Signs Assessment: post-procedure vital signs reviewed and stable ?Respiratory status: spontaneous breathing, nonlabored ventilation and respiratory function stable ?Cardiovascular status: blood pressure returned to baseline and stable ?Postop Assessment: Adequate PO intake and No signs of nausea or vomiting ?Anesthetic complications: no ? ? ?No notable events documented. ? ?Raliegh Ip ? ? ? ? ? ?

## 2021-06-09 NOTE — H&P (Signed)
[place]  ? ?Primary Care Physician:  [facility] ?Ophthalmologist: Dr. George Ina ? ?Pre-Procedure History & Physical: ?HPI:  Robin Daniels is a 66 y.o. female here for cataract surgery. ?  ?Past Medical History:  ?Diagnosis Date  ? Anemia   ? history of  ? Anxiety 09/11/2014  ? Arthritis   ? Cancer of head of pancreas ([place]) 12/04/2019  ? Complication of anesthesia   ? Fluttering heart   ? Hyperlipidemia   ? Hypertension   ? PONV (postoperative nausea and vomiting)   ? Pre-diabetes   ? Vertigo   ? none recently  ? ? ?Past Surgical History:  ?Procedure Laterality Date  ? COLONOSCOPY WITH PROPOFOL N/A 01/13/2021  ? Procedure: COLONOSCOPY WITH PROPOFOL;  Surgeon: Jonathon Bellows, MD;  Location: [facility] ENDOSCOPY;  Service: Gastroenterology;  Laterality: N/A;  ? ENDOSCOPIC RETROGRADE CHOLANGIOPANCREATOGRAPHY (ERCP) WITH PROPOFOL N/A 11/16/2019  ? Procedure: ENDOSCOPIC RETROGRADE CHOLANGIOPANCREATOGRAPHY (ERCP) WITH PROPOFOL;  Surgeon: Lucilla Lame, MD;  Location: [facility] ENDOSCOPY;  Service: Endoscopy;  Laterality: N/A;  ? ERCP N/A 03/27/2020  ? Procedure: ENDOSCOPIC RETROGRADE CHOLANGIOPANCREATOGRAPHY (ERCP);  Surgeon: Lucilla Lame, MD;  Location: [facility] ENDOSCOPY;  Service: Endoscopy;  Laterality: N/A;  ? EUS N/A 11/29/2019  ? Procedure: FULL UPPER ENDOSCOPIC ULTRASOUND (EUS) RADIAL;  Surgeon: Reita Cliche, MD;  Location: [facility] ENDOSCOPY;  Service: Gastroenterology;  Laterality: N/A;  ? IR FLUORO RM 30-60 MIN  07/30/2020  ? IR IMAGING GUIDED PORT INSERTION  12/14/2019  ? JEJUNOSTOMY Left 07/15/2020  ? Procedure: JEJUNOSTOMY;  Surgeon: Stark Klein, MD;  Location: [place];  Service: General;  Laterality: Left;  ? LAPAROSCOPY N/A 07/15/2020  ? Procedure: DIAGNOSTIC LAPAROSCOPY;  Surgeon: Stark Klein, MD;  Location: [place];  Service: General;  Laterality: N/A;  ? right knee replacement Right [id number]  ? SHOULDER ARTHROSCOPY W/ ROTATOR CUFF REPAIR Right   ? WHIPPLE PROCEDURE N/A 07/15/2020  ? Procedure: WHIPPLE  PROCEDURE;  Surgeon: Stark Klein, MD;  Location: [place];  Service: General;  Laterality: N/A;  ? ? ?Prior to Admission medications   ?Medication Sig Start Date End Date Taking? Authorizing Provider  ?amLODipine (NORVASC) 10 MG tablet Take 1 tablet (10 mg total) by mouth daily. 05/27/20  Yes Cammie Sickle, MD  ?Cobalamin Combinations (VITAMIN B12-FOLIC ACID PO) Take by mouth daily. 1000 mcg/400 mcg   Yes [provider]  ?DULoxetine (CYMBALTA) 20 MG capsule Take 1 capsule (20 mg total) by mouth daily. 08/22/20  Yes Jacquelin Hawking, NP  ?lisinopril (ZESTRIL) 10 MG tablet Take 10 mg by mouth daily.   Yes [provider]  ?metoprolol succinate (TOPROL-XL) 50 MG 24 hr tablet Take 1 tablet (50 mg total) by mouth daily. Take with or immediately following a meal. 05/27/20  Yes Cammie Sickle, MD  ?Potassium Chloride ER 20 MEQ TBCR Take 20 mEq by mouth in the morning and at bedtime. 05/27/20  Yes Cammie Sickle, MD  ?vitamin B-12 (CYANOCOBALAMIN) 1000 MCG tablet Take 1 tablet (1,000 mcg total) by mouth daily. 11/17/19  Yes Jennye Boroughs, MD  ?acetaminophen (TYLENOL) 325 MG tablet Take 650 mg by mouth every 6 (six) hours as needed for moderate pain. ?Patient not taking: Reported on 08/08/2020    [provider]  ?diphenoxylate-atropine (LOMOTIL) 2.5-0.025 MG tablet Take 1 tablet by mouth 4 (four) times daily as needed for diarrhea or loose stools. Take it along with immodium ?Patient not taking: Reported on 08/08/2020 01/02/20   Cammie Sickle, MD  ?feeding  supplement (ENSURE ENLIVE / ENSURE PLUS) LIQD Take 237 mLs by mouth 2 (two) times daily between meals. ?Patient not taking: Reported on 01/23/2021 07/22/20   Stark Klein, MD  ?gabapentin (NEURONTIN) 100 MG capsule Take 1 capsule (100 mg total) by mouth 3 (three) times daily. ?Patient not taking: Reported on 04/27/2021 08/08/20   Jacquelin Hawking, NP  ?lidocaine-prilocaine (EMLA) cream Apply 1 application topically daily as  needed (numbing). ?Patient not taking: Reported on 06/02/2021 05/07/20   [provider]  ?ondansetron (ZOFRAN-ODT) 4 MG disintegrating tablet Take 1 tablet (4 mg total) by mouth every 6 (six) hours as needed for nausea. ?Patient not taking: Reported on 08/22/2020 07/22/20   Stark Klein, MD  ?oxyCODONE (OXY IR/ROXICODONE) 5 MG immediate release tablet Take 1 tablet (5 mg total) by mouth every 4 (four) hours as needed for moderate pain. ?Patient not taking: Reported on 06/02/2021 07/22/20   Stark Klein, MD  ?pantoprazole (PROTONIX) 40 MG tablet Take 1 tablet (40 mg total) by mouth at bedtime. ?Patient not taking: Reported on 10/24/2020 07/22/20   Stark Klein, MD  ?Sod Picosulfate-Mag Ox-Cit Acd (CLENPIQ) 10-3.5-12 MG-GM -GM/160ML SOLN Take 1 kit by mouth as directed. At 5 PM evening before procedure, drink 1 bottle of Clenpiq, hydrate, drink (5) 8 oz of water. Then do the same thing 5 hours prior to your procedure. ?Patient not taking: Reported on 01/23/2021 12/29/20   Jonathon Bellows, MD  ?prochlorperazine (COMPAZINE) 10 MG tablet Take 1 tablet (10 mg total) by mouth every 6 (six) hours as needed (Nausea or vomiting). 12/17/19 01/01/20  Cammie Sickle, MD  ? ? ?Allergies as of 05/12/2021  ? (No Known Allergies)  ? ? ?Family History  ?Problem Relation Age of Onset  ? Diabetes Mother   ? Hyperlipidemia Mother   ? Hypertension Mother   ? Vision loss Mother   ? Arthritis Father   ? Hyperlipidemia Father   ? Hypertension Father   ? Breast cancer Maternal Aunt   ? ? ?Social History  ? ?Socioeconomic History  ? Marital status: Divorced  ?  Spouse name: Not on file  ? Number of children: Not on file  ? Years of education: Not on file  ? Highest education level: Not on file  ?Occupational History  ? Not on file  ?Tobacco Use  ? Smoking status: Never  ? Smokeless tobacco: Never  ?Vaping Use  ? Vaping Use: Never used  ?Substance and Sexual Activity  ? Alcohol use: No  ?  Alcohol/week: 0.0 standard drinks  ? Drug use: No  ?  Sexual activity: Never  ?Other Topics Concern  ? Not on file  ?Social History Narrative  ? ** Merged History Encounter **  ?    ? 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   ? ?Social Determinants of Health  ? ?Financial Resource Strain: Not on file  ?Food Insecurity: Not on file  ?Transportation Needs: Not on file  ?Physical Activity: Not on file  ?Stress: Not on file  ?Social Connections: Not on file  ?Intimate Partner Violence: Not on file  ? ? ?Review of Systems: ?See HPI, otherwise negative ROS ? ?Physical Exam: ?BP (!) 179/85   Pulse 81   Temp (!) 97.3 ?F (36.3 ?C) (Temporal)   Resp 20   Ht 5' 3" (1.6 m)   Wt 85.3 kg   LMP  (LMP Unknown)   SpO2 99%   BMI 33.30 kg/m?  ?General:  Alert, cooperative in NAD ?Head:  Normocephalic and atraumatic. ?Respiratory:  Normal work of breathing. ?Cardiovascular:  RRR ? ?Impression/Plan: ?Robin Daniels is here for cataract surgery. ? ?Risks, benefits, limitations, and alternatives regarding cataract surgery have been reviewed with the patient.  Questions have been answered.  All parties agreeable. ? ? ?Birder Robson, MD  06/09/2021, 10:31 AM ? ? ?

## 2021-06-09 NOTE — Discharge Instructions (Signed)

## 2021-06-09 NOTE — Op Note (Signed)
PREOPERATIVE DIAGNOSIS:  Nuclear sclerotic cataract of the right eye. ?  ?POSTOPERATIVE DIAGNOSIS:  Cataract ?  ?OPERATIVE PROCEDURE:ORPROCALL@ ?  ?SURGEON:  Birder Robson, MD. ?  ?ANESTHESIA: ? ?Anesthesiologist: Ronelle Nigh, MD ?CRNA: Dionne Bucy, CRNA ? ?1.      Managed anesthesia care. ?2.      0.22m of Shugarcaine was instilled in the eye following the paracentesis. ?  ?COMPLICATIONS:  None. ?  ?TECHNIQUE:   Stop and chop ?  ?DESCRIPTION OF PROCEDURE:  The patient was examined and consented in the preoperative holding area where the aforementioned topical anesthesia was applied to the right eye and then brought back to the Operating Room where the right eye was prepped and draped in the usual sterile ophthalmic fashion and a lid speculum was placed. A paracentesis was created with the side port blade and the anterior chamber was filled with viscoelastic. A near clear corneal incision was performed with the steel keratome. A continuous curvilinear capsulorrhexis was performed with a cystotome followed by the capsulorrhexis forceps. Hydrodissection and hydrodelineation were carried out with BSS on a blunt cannula. The lens was removed in a stop and chop  technique and the remaining cortical material was removed with the irrigation-aspiration handpiece. The capsular bag was inflated with viscoelastic and the Technis [id number]  lens was placed in the capsular bag without complication. The remaining viscoelastic was removed from the eye with the irrigation-aspiration handpiece. The wounds were hydrated. The anterior chamber was flushed with BSS and the eye was inflated to physiologic pressure. 0.125mof Vigamox was placed in the anterior chamber. The wounds were found to be water tight. The eye was dressed with Combigan. The patient was given protective glasses to wear throughout the day and a shield with which to sleep tonight. The patient was also given drops with which to begin a drop regimen today and will  follow-up with me in one day. ?Implant Name Type Inv. Item Serial No. Manufacturer Lot No. LRB No. Used Action  ?LENS IOL TECNIS EYHANCE 18.5 - [id number] Lens LENS IOL TECNIS EYHANCE 18.5 [id number]  Right 1 Implanted  ? ?Procedure(s) with comments: ?CATARACT EXTRACTION PHACO AND INTRAOCULAR LENS PLACEMENT (IOC) RIGHT malyugin (Right) - 12.40 ?1:07.0 ? ?Electronically signed: WiBirder Robson/14/2023 1:22 PM ? ?

## 2021-06-09 NOTE — Anesthesia Procedure Notes (Signed)
Procedure Name: [place] ?Date/Time: 06/09/2021 10:42 AM ?Performed by: Dionne Bucy, CRNA ?Pre-anesthesia Checklist: Patient identified, Emergency Drugs available, Suction available, Patient being monitored and Timeout performed ?Patient Re-evaluated:Patient Re-evaluated prior to induction ?Oxygen Delivery Method: Nasal cannula ?Placement Confirmation: positive ETCO2 ? ? ? ? ?

## 2021-06-09 NOTE — Transfer of Care (Signed)
Immediate Anesthesia Transfer of Care Note ? ?Patient: Robin Daniels ? ?Procedure(s) Performed: CATARACT EXTRACTION PHACO AND INTRAOCULAR LENS PLACEMENT (I[facility]) RIGHT malyugin (Right: Eye) ? ?Patient Location: PACU ? ?Anesthesia Type: MAC ? ?Level of Consciousness: awake, alert  and patient cooperative ? ?Airway and Oxygen Therapy: Patient Spontanous Breathing and Patient connected to supplemental oxygen ? ?Post-op Assessment: Post-op Vital signs reviewed, Patient's Cardiovascular Status Stable, Respiratory Function Stable, Patent Airway and No signs of Nausea or vomiting ? ?Post-op Vital Signs: Reviewed and stable ? ?Complications: No notable events documented. ? ?

## 2021-06-10 ENCOUNTER — Encounter: Payer: Self-pay | Admitting: Ophthalmology

## 2021-06-15 ENCOUNTER — Other Ambulatory Visit: Payer: Self-pay | Admitting: *Deleted

## 2021-06-15 DIAGNOSIS — D649 Anemia, unspecified: Secondary | ICD-10-CM

## 2021-06-23 ENCOUNTER — Other Ambulatory Visit: Payer: Self-pay

## 2021-06-23 ENCOUNTER — Inpatient Hospital Stay: Payer: Medicare Other | Attending: Internal Medicine | Admitting: Medical Oncology

## 2021-06-23 ENCOUNTER — Encounter: Payer: Self-pay | Admitting: Medical Oncology

## 2021-06-23 ENCOUNTER — Inpatient Hospital Stay: Payer: Medicare Other

## 2021-06-23 VITALS — BP 158/84 | HR 81 | Temp 97.6°F | Resp 16 | Wt 192.6 lb

## 2021-06-23 DIAGNOSIS — D649 Anemia, unspecified: Secondary | ICD-10-CM | POA: Diagnosis not present

## 2021-06-23 DIAGNOSIS — G629 Polyneuropathy, unspecified: Secondary | ICD-10-CM

## 2021-06-23 DIAGNOSIS — Z452 Encounter for adjustment and management of vascular access device: Secondary | ICD-10-CM | POA: Diagnosis not present

## 2021-06-23 DIAGNOSIS — C250 Malignant neoplasm of head of pancreas: Secondary | ICD-10-CM

## 2021-06-23 DIAGNOSIS — R748 Abnormal levels of other serum enzymes: Secondary | ICD-10-CM | POA: Insufficient documentation

## 2021-06-23 DIAGNOSIS — Z95828 Presence of other vascular implants and grafts: Secondary | ICD-10-CM

## 2021-06-23 DIAGNOSIS — I10 Essential (primary) hypertension: Secondary | ICD-10-CM | POA: Diagnosis not present

## 2021-06-23 DIAGNOSIS — D509 Iron deficiency anemia, unspecified: Secondary | ICD-10-CM | POA: Diagnosis not present

## 2021-06-23 LAB — IRON AND TIBC
Iron: 57 ug/dL (ref 28–170)
Saturation Ratios: 15 % (ref 10.4–31.8)
TIBC: 393 ug/dL (ref 250–450)
UIBC: 336 ug/dL

## 2021-06-23 LAB — COMPREHENSIVE METABOLIC PANEL
ALT: 16 U/L (ref 0–44)
AST: 23 U/L (ref 15–41)
Albumin: 3.7 g/dL (ref 3.5–5.0)
Alkaline Phosphatase: 112 U/L (ref 38–126)
Anion gap: 7 (ref 5–15)
BUN: 10 mg/dL (ref 8–23)
CO2: 26 mmol/L (ref 22–32)
Calcium: 8.8 mg/dL — ABNORMAL LOW (ref 8.9–10.3)
Chloride: 104 mmol/L (ref 98–111)
Creatinine, Ser: 0.71 mg/dL (ref 0.44–1.00)
GFR, Estimated: >60 mL/min (ref >60)
Glucose, Bld: 110 mg/dL — ABNORMAL HIGH (ref 70–99)
Potassium: 3.5 mmol/L (ref 3.5–5.1)
Sodium: 137 mmol/L (ref 135–145)
Total Bilirubin: 0.3 mg/dL (ref 0.3–1.2)
Total Protein: 7.6 g/dL (ref 6.5–8.1)

## 2021-06-23 LAB — CBC WITH DIFFERENTIAL/PLATELET
Abs Immature Granulocytes: 0.01 10*3/uL (ref 0.00–0.07)
Basophils Absolute: 0 10*3/uL (ref 0.0–0.1)
Basophils Relative: 0 %
Eosinophils Absolute: 0.1 10*3/uL (ref 0.0–0.5)
Eosinophils Relative: 2 %
HCT: 32.1 % — ABNORMAL LOW (ref 36.0–46.0)
Hemoglobin: 10.1 g/dL — ABNORMAL LOW (ref 12.0–15.0)
Immature Granulocytes: 0 %
Lymphocytes Relative: 41 %
Lymphs Abs: 2 10*3/uL (ref 0.7–4.0)
MCH: 23.9 pg — ABNORMAL LOW (ref 26.0–34.0)
MCHC: 31.5 g/dL (ref 30.0–36.0)
MCV: 75.9 fL — ABNORMAL LOW (ref 80.0–100.0)
Monocytes Absolute: 0.4 10*3/uL (ref 0.1–1.0)
Monocytes Relative: 9 %
Neutro Abs: 2.3 10*3/uL (ref 1.7–7.7)
Neutrophils Relative %: 48 %
Platelets: 288 10*3/uL (ref 150–400)
RBC: 4.23 MIL/uL (ref 3.87–5.11)
RDW: 15.4 % (ref 11.5–15.5)
WBC: 4.9 10*3/uL (ref 4.0–10.5)
nRBC: 0 % (ref 0.0–0.2)

## 2021-06-23 LAB — FERRITIN: Ferritin: 27 ng/mL (ref 11–307)

## 2021-06-23 MED ORDER — SODIUM CHLORIDE 0.9% FLUSH
10.0000 mL | INTRAVENOUS | Status: AC | PRN
  Administered 2021-06-23: 10 mL via INTRAVENOUS
  Filled 2021-06-23: qty 10

## 2021-06-23 MED ORDER — HEPARIN SOD (PORK) LOCK FLUSH 100 UNIT/ML IV SOLN
500.0000 [IU] | Freq: Once | INTRAVENOUS | Status: AC
  Administered 2021-06-23: 500 [IU] via INTRAVENOUS
  Filled 2021-06-23: qty 5

## 2021-06-23 NOTE — Progress Notes (Addendum)
Robin Daniels ?CONSULT NOTE ? ?Patient Care Team: ?[place] as PCP - General ?Gennette Pac, [place] (Family Medicine) ?Rico Junker, RN as Registered Nurse ?Theodore Demark, RN as Registered Nurse ?Clent Jacks, RN as Oncology Nurse Navigator ?Stark Klein, MD as Consulting Physician (General Surgery) ?Cammie Sickle, MD as Consulting Physician (Internal Medicine) ? ?CHIEF COMPLAINTS/PURPOSE OF CONSULTATION: pancreas adenocarcinoma ? ? ?Oncology History Overview Note  ?#Pancreas adenocarcinoma-Stage IB- [id number]-[EUS- Dr.Spaete; [facility]/GI; mass 22x76m; invading/abutting superior mesenteric vein; 2 enlarged lymph nodes peripancreatic/porta hepatis largest 10 x 5.8 mm-nonpathologic based on EUS criteria/no biopsy]-borderline resectable.  PET scan no evidence of distant metastatic disease. ? ?#Biliary obstruction status post ERCP and stenting [Dr.Wohl]- ? ?# SEP, 22,2021- GEM-ABRAXANE s/p cycle 1-d1 [discontinued secondary to shortage]; s/p evaluation with Dr. BStevan BornSeptember] ? ?# 01/02/2020-FOLFIRINOX; Neulasta. S/p FOLFIRINOX 10 cycles- April 19th whipples-  s/p neoadjuvant FOLFIRINX. #10 cycles]-s/p Whipple's [on April 19th 2022]- ypT2 [3.5cm]; pN0/11;   DIS-CONTINUED  FOLFIRINOX cycle #11 & 12-  Sec to PN-2-3. ? ?PANCREAS (EXOCRINE), CARCINOMA: Resection  ?Procedure: Whipple procedure  ?Tumor Site: Head of pancreas.  ?Tumor Size: 3.5 cm, slide measurement.  ?Histologic Type: Pancreatic ductal adenocarcinoma.  ?Histologic Grade: Moderately differentiated.  ?Tumor Extension: Into peripancreatic connective tissue.  ?Treatment Effect: Moderate treatment effect.  ?Lymphovascular Invasion: Not identified.  ?Perineural Invasion: Present.  ?Margins: All surgical margins are negative for carcinoma.  ?Regional Lymph Nodes:  ?     Number of Lymph Nodes with Tumor: 0  ?     Number of Lymph Nodes Examined: 15  ?Distant Metastasis:  ?     Distant Site(s) Involved: Not  applicable.  ?Pathologic Stage Classification (pTNM, AJCC 8th Edition): ypT2, ypN0  ? ?# Borderline DM; HTN  ? ?# SURVIVORSHIP:  ? ?# GENETICS:  ? ?DIAGNOSIS: Pancreatic cancer ? ?STAGE:  IB/borderline resectable;  GOALS: cure ? ?  ?Cancer of head of pancreas ([place]  ?12/04/2019 Initial Diagnosis  ? Cancer of head of pancreas ([facility] ?  ?12/19/2019 - 12/19/2019 Chemotherapy  ? The patient had PACLitaxel-protein bound (ABRAXANE) chemo infusion 250 mg, 125 mg/m2 = 250 mg, Intravenous,  Once, 1 of 4 cycles ?Administration: 250 mg (12/19/2019) ?gemcitabine (GEMZAR) 2,000 mg in sodium chloride 0.9 % 250 mL chemo infusion, 1,976 mg, Intravenous,  Once, 1 of 4 cycles ?Administration: 2,000 mg (12/19/2019) ? ? for chemotherapy treatment.  ? ?  ?01/02/2020 -  Chemotherapy  ?  Patient is on Treatment Plan: PANCREAS MODIFIED FOLFIRINOX Q14D X 4 CYCLES ? ?  ? ?  ? ?HISTORY OF PRESENTING ILLNESS:  ?LVERGINIA [id number].o.  female borderline resectable pancreatic adeno ca currently s/p neoadjuvant chemotherapy with FOLFIRINOX x10 cycles-status post Whipple's on July 15, 2020  is here for follow-up ? ?Today she reports that she is doing well. Her HTN has improved with closer monitoring. Patient denies any worsening nausea or vomiting or abdominal pain. She does not need any pain medications as this is managed by her pain specialist.  ? ?Admits to improvement of her neuropathy with dose increase of Cymbalta from 30 mg to 60 mg; though not a complete resolve. ? ?Review of Systems  ?Constitutional:  Negative for chills, diaphoresis, fever and malaise/fatigue.  ?HENT:  Negative for nosebleeds and sore throat.   ?Eyes:  Negative for double vision.  ?Respiratory:  Negative for cough, hemoptysis, sputum production, shortness of breath and wheezing.   ?Cardiovascular:  Negative for chest pain, palpitations, orthopnea and leg swelling.  ?  Gastrointestinal:  Negative for abdominal pain, blood in stool, constipation, diarrhea, heartburn, melena, nausea  and vomiting.  ?Genitourinary:  Negative for dysuria, frequency and urgency.  ?Musculoskeletal:  Negative for back pain and joint pain.  ?Skin: Negative.  Negative for itching and rash.  ?Neurological:  Positive for tingling. Negative for dizziness, focal weakness, weakness and headaches.  ?Endo/Heme/Allergies:  Does not bruise/bleed easily.  ?Psychiatric/Behavioral:  Negative for depression. The patient is not nervous/anxious and does not have insomnia.    ? ?MEDICAL HISTORY:  ?Past Medical History:  ?Diagnosis Date  ? Anemia   ? history of  ? Anxiety 09/11/2014  ? Arthritis   ? 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 Complication of anesthesia   ? Fluttering heart   ? Hyperlipidemia   ? Hypertension   ? PONV (postoperative nausea and vomiting)   ? Pre-diabetes   ? Vertigo   ? none recently  ? ? ?SURGICAL HISTORY: ?Past Surgical History:  ?Procedure Laterality Date  ? CATARACT EXTRACTION W/PHACO Right 06/09/2021  ? Procedure: CATARACT EXTRACTION PHACO AND INTRAOCULAR LENS PLACEMENT ([place]) RIGHT malyugin;  Surgeon: Birder Robson, MD;  Location: [place];  Service: Ophthalmology;  Laterality: Right;  12.40 ?1:07.0  ? COLONOSCOPY WITH PROPOFOL N/A 01/13/2021  ? Procedure: COLONOSCOPY WITH PROPOFOL;  Surgeon: Jonathon Bellows, MD;  Location: [facility] ENDOSCOPY;  Service: Gastroenterology;  Laterality: N/A;  ? ENDOSCOPIC RETROGRADE CHOLANGIOPANCREATOGRAPHY (ERCP) WITH PROPOFOL N/A 11/16/2019  ? Procedure: ENDOSCOPIC RETROGRADE CHOLANGIOPANCREATOGRAPHY (ERCP) WITH PROPOFOL;  Surgeon: Lucilla Lame, MD;  Location: [facility] ENDOSCOPY;  Service: Endoscopy;  Laterality: N/A;  ? ERCP N/A 03/27/2020  ? Procedure: ENDOSCOPIC RETROGRADE CHOLANGIOPANCREATOGRAPHY (ERCP);  Surgeon: Lucilla Lame, MD;  Location: [facility] ENDOSCOPY;  Service: Endoscopy;  Laterality: N/A;  ? EUS N/A 11/29/2019  ? Procedure: FULL UPPER ENDOSCOPIC ULTRASOUND (EUS) RADIAL;  Surgeon: Reita Cliche, MD;  Location: [facility] ENDOSCOPY;  Service:  Gastroenterology;  Laterality: N/A;  ? IR FLUORO RM 30-60 MIN  07/30/2020  ? IR IMAGING GUIDED PORT INSERTION  12/14/2019  ? JEJUNOSTOMY Left 07/15/2020  ? Procedure: JEJUNOSTOMY;  Surgeon: Stark Klein, MD;  Location: [place];  Service: General;  Laterality: Left;  ? LAPAROSCOPY N/A 07/15/2020  ? Procedure: DIAGNOSTIC LAPAROSCOPY;  Surgeon: Stark Klein, MD;  Location: [place];  Service: General;  Laterality: N/A;  ? right knee replacement Right [id number]  ? SHOULDER ARTHROSCOPY W/ ROTATOR CUFF REPAIR Right   ? WHIPPLE PROCEDURE N/A 07/15/2020  ? Procedure: WHIPPLE PROCEDURE;  Surgeon: Stark Klein, MD;  Location: [place];  Service: General;  Laterality: N/A;  ? ? ?SOCIAL HISTORY: ?Social History  ? ?Socioeconomic History  ? Marital status: Divorced  ?  Spouse name: Not on file  ? Number of children: Not on file  ? Years of education: Not on file  ? Highest education level: Not on file  ?Occupational History  ? Not on file  ?Tobacco Use  ? Smoking status: Never  ? Smokeless tobacco: Never  ?Vaping Use  ? Vaping Use: Never used  ?Substance and Sexual Activity  ? Alcohol use: No  ?  Alcohol/week: 0.0 standard drinks  ? Drug use: No  ? Sexual activity: Never  ?Other Topics Concern  ? Not on file  ?Social History Narrative  ? ** Merged History Encounter **  ?    ? 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   ? ?Social Determinants of Health  ? ?Financial Resource Strain: Not on file  ?  Food Insecurity: Not on file  ?Transportation Needs: Not on file  ?Physical Activity: Not on file  ?Stress: Not on file  ?Social Connections: Not on file  ?Intimate Partner Violence: Not on file  ? ? ?FAMILY HISTORY: ?Family History  ?Problem Relation Age of Onset  ? Diabetes Mother   ? Hyperlipidemia Mother   ? Hypertension Mother   ? Vision loss Mother   ? Arthritis Father   ? Hyperlipidemia Father   ? Hypertension Father   ? Breast cancer Maternal Aunt   ? ? ?ALLERGIES:  has No Known  Allergies. ? ?MEDICATIONS:  ?Current Outpatient Medications  ?Medication Sig Dispense Refill  ? acetaminophen (TYLENOL) 325 MG tablet Take 650 mg by mouth every 6 (six) hours as needed for moderate pain.    ? amLODipine (NORVASC) 10 MG table

## 2021-06-23 NOTE — Progress Notes (Signed)
Pt in for follow up, denies any concerns today. 

## 2021-06-24 LAB — CANCER ANTIGEN 19-9: CA 19-9: 14 U/mL (ref 0–35)

## 2021-06-25 ENCOUNTER — Ambulatory Visit
Admission: RE | Admit: 2021-06-25 | Discharge: 2021-06-25 | Disposition: A | Discharge status: Home / Self Care | Payer: Medicare Other | Source: Ambulatory Visit | Attending: Physician Assistant | Admitting: Physician Assistant

## 2021-06-25 DIAGNOSIS — Z780 Asymptomatic menopausal state: Secondary | ICD-10-CM | POA: Insufficient documentation

## 2021-06-25 DIAGNOSIS — Z13820 Encounter for screening for osteoporosis: Secondary | ICD-10-CM | POA: Diagnosis present

## 2021-06-25 DIAGNOSIS — Z1231 Encounter for screening mammogram for malignant neoplasm of breast: Secondary | ICD-10-CM | POA: Diagnosis not present

## 2021-06-25 DIAGNOSIS — Z9221 Personal history of antineoplastic chemotherapy: Secondary | ICD-10-CM | POA: Diagnosis not present

## 2021-06-25 DIAGNOSIS — Z8507 Personal history of malignant neoplasm of pancreas: Secondary | ICD-10-CM | POA: Insufficient documentation

## 2021-07-21 ENCOUNTER — Ambulatory Visit: Payer: Medicare Other

## 2021-07-21 ENCOUNTER — Ambulatory Visit
Admission: RE | Admit: 2021-07-21 | Discharge: 2021-07-21 | Disposition: A | Discharge status: Home / Self Care | Payer: Medicare Other | Source: Ambulatory Visit | Attending: Medical Oncology | Admitting: Medical Oncology

## 2021-07-21 DIAGNOSIS — C250 Malignant neoplasm of head of pancreas: Secondary | ICD-10-CM | POA: Diagnosis not present

## 2021-07-21 MED ORDER — IOHEXOL 300 MG/ML  SOLN
100.0000 mL | Freq: Once | INTRAMUSCULAR | Status: AC | PRN
  Administered 2021-07-21: 100 mL via INTRAVENOUS

## 2021-07-24 ENCOUNTER — Inpatient Hospital Stay: Payer: Medicare Other | Attending: Internal Medicine | Admitting: Internal Medicine

## 2021-07-24 DIAGNOSIS — Z8507 Personal history of malignant neoplasm of pancreas: Secondary | ICD-10-CM | POA: Insufficient documentation

## 2021-07-24 DIAGNOSIS — D649 Anemia, unspecified: Secondary | ICD-10-CM | POA: Insufficient documentation

## 2021-07-24 DIAGNOSIS — R7401 Elevation of levels of liver transaminase levels: Secondary | ICD-10-CM | POA: Diagnosis not present

## 2021-07-24 DIAGNOSIS — Z79899 Other long term (current) drug therapy: Secondary | ICD-10-CM | POA: Insufficient documentation

## 2021-07-24 DIAGNOSIS — C250 Malignant neoplasm of head of pancreas: Secondary | ICD-10-CM

## 2021-07-24 DIAGNOSIS — I10 Essential (primary) hypertension: Secondary | ICD-10-CM | POA: Insufficient documentation

## 2021-07-24 DIAGNOSIS — Z9221 Personal history of antineoplastic chemotherapy: Secondary | ICD-10-CM | POA: Insufficient documentation

## 2021-07-24 NOTE — Progress Notes (Signed)
Still has neuropathy in her feet that is worse at night and no relief with the Cymbalta.   ?

## 2021-07-24 NOTE — Progress Notes (Signed)
[place] ?CONSULT NOTE ? ?Patient Care Team: ?[place] as PCP - General ?Gennette Pac, [place] (Family Medicine) ?Rico Junker, RN as Registered Nurse ?Theodore Demark, RN as Registered Nurse ?Clent Jacks, RN as Oncology Nurse Navigator ?Stark Klein, MD as Consulting Physician (General Surgery) ?Cammie Sickle, MD as Consulting Physician (Internal Medicine) ? ?CHIEF COMPLAINTS/PURPOSE OF CONSULTATION: pancreas adenocarcinoma ? ? ?Oncology History Overview Note  ?#Pancreas adenocarcinoma-Stage IB- [id number]-[EUS- Dr.Spaete; [facility]/GI; mass 22x20m; invading/abutting superior mesenteric vein; 2 enlarged lymph nodes peripancreatic/porta hepatis largest 10 x 5.8 mm-nonpathologic based on EUS criteria/no biopsy]-borderline resectable.  PET scan no evidence of distant metastatic disease. ? ?#Biliary obstruction status post ERCP and stenting [Dr.Wohl]- ? ?# SEP, 22,2021- GEM-ABRAXANE s/p cycle 1-d1 [discontinued secondary to shortage]; s/p evaluation with Dr. BStevan BornSeptember] ? ?# 01/02/2020-FOLFIRINOX; Neulasta. S/p FOLFIRINOX 10 cycles- April 19th whipples-  s/p neoadjuvant FOLFIRINX. #10 cycles]-s/p Whipple's [on April 19th 2022]- ypT2 [3.5cm]; pN0/11;   DIS-CONTINUED  FOLFIRINOX cycle #11 & 12-  Sec to PN-2-3. ? ?PANCREAS (EXOCRINE), CARCINOMA: Resection  ?Procedure: Whipple procedure  ?Tumor Site: Head of pancreas.  ?Tumor Size: 3.5 cm, slide measurement.  ?Histologic Type: Pancreatic ductal adenocarcinoma.  ?Histologic Grade: Moderately differentiated.  ?Tumor Extension: Into peripancreatic connective tissue.  ?Treatment Effect: Moderate treatment effect.  ?Lymphovascular Invasion: Not identified.  ?Perineural Invasion: Present.  ?Margins: All surgical margins are negative for carcinoma.  ?Regional Lymph Nodes:  ?     Number of Lymph Nodes with Tumor: 0  ?     Number of Lymph Nodes Examined: 15  ?Distant Metastasis:  ?     Distant Site(s) Involved: Not  applicable.  ?Pathologic Stage Classification (pTNM, AJCC 8th Edition): ypT2, ypN0  ? ?# Borderline DM; HTN  ? ?# SURVIVORSHIP:  ? ?# GENETICS:  ? ?DIAGNOSIS: Pancreatic cancer ? ?STAGE:  IB/borderline resectable;  GOALS: cure ? ?  ?Cancer of head of pancreas ([facility] [place]  ?12/04/2019 Initial Diagnosis  ? Cancer of head of pancreas ([facility] ?  ?12/19/2019 - 12/19/2019 Chemotherapy  ? The patient had PACLitaxel-protein bound (ABRAXANE) chemo infusion 250 mg, 125 mg/m2 = 250 mg, Intravenous,  Once, 1 of 4 cycles ?Administration: 250 mg (12/19/2019) ?gemcitabine (GEMZAR) 2,000 mg in sodium chloride 0.9 % 250 mL chemo infusion, 1,976 mg, Intravenous,  Once, 1 of 4 cycles ?Administration: 2,000 mg (12/19/2019) ? ? for chemotherapy treatment.  ? ?  ?01/02/2020 -  Chemotherapy  ?  Patient is on Treatment Plan: PANCREAS MODIFIED FOLFIRINOX Q14D X 4 CYCLES ? ?  ? ?  ? ?HISTORY OF PRESENTING ILLNESS:  ?Robin SYLVIA89y.o.  female borderline resectable pancreatic adeno ca currently s/p neoadjuvant chemotherapy with FOLFIRINOX x10 cycles-status post Whipple's on July 15, 2020  is here for follow-up-review of the CT scan. ? ?Patient continues to have tingling and numbness in the extremities.  Denies any worsening. She continues to take Cymbalta.  ? ?Denies any worsening abdominal pain.  No nausea no vomiting.  No fever no chills. ? ? ?Review of Systems  ?Constitutional:  Negative for chills, diaphoresis, fever and malaise/fatigue.  ?HENT:  Negative for nosebleeds and sore throat.   ?Eyes:  Negative for double vision.  ?Respiratory:  Negative for cough, hemoptysis, sputum production, shortness of breath and wheezing.   ?Cardiovascular:  Negative for chest pain, palpitations, orthopnea and leg swelling.  ?Gastrointestinal:  Negative for abdominal pain, blood in stool, constipation, diarrhea, heartburn, melena, nausea and vomiting.  ?Genitourinary:  Negative for dysuria,  frequency and urgency.  ?Musculoskeletal:  Negative for back pain and joint  pain.  ?Skin: Negative.  Negative for itching and rash.  ?Neurological:  Positive for tingling. Negative for dizziness, focal weakness, weakness and headaches.  ?Endo/Heme/Allergies:  Does not bruise/bleed easily.  ?Psychiatric/Behavioral:  Negative for depression. The patient is not nervous/anxious and does not have insomnia.    ? ?MEDICAL HISTORY:  ?Past Medical History:  ?Diagnosis Date  ? Anemia   ? history of  ? Anxiety 09/11/2014  ? Arthritis   ? Cancer of head of pancreas ([place]) 12/04/2019  ? Complication of anesthesia   ? Fluttering heart   ? Hyperlipidemia   ? Hypertension   ? PONV (postoperative nausea and vomiting)   ? Pre-diabetes   ? Vertigo   ? none recently  ? ? ?SURGICAL HISTORY: ?Past Surgical History:  ?Procedure Laterality Date  ? CATARACT EXTRACTION W/PHACO Right 06/09/2021  ? Procedure: CATARACT EXTRACTION PHACO AND INTRAOCULAR LENS PLACEMENT ([place]) RIGHT malyugin;  Surgeon: Birder Robson, MD;  Location: [facility];  Service: Ophthalmology;  Laterality: Right;  12.40 ?1:07.0  ? COLONOSCOPY WITH PROPOFOL N/A 01/13/2021  ? Procedure: COLONOSCOPY WITH PROPOFOL;  Surgeon: Jonathon Bellows, MD;  Location: [facility] ENDOSCOPY;  Service: Gastroenterology;  Laterality: N/A;  ? ENDOSCOPIC RETROGRADE CHOLANGIOPANCREATOGRAPHY (ERCP) WITH PROPOFOL N/A 11/16/2019  ? Procedure: ENDOSCOPIC RETROGRADE CHOLANGIOPANCREATOGRAPHY (ERCP) WITH PROPOFOL;  Surgeon: Lucilla Lame, MD;  Location: [facility] ENDOSCOPY;  Service: Endoscopy;  Laterality: N/A;  ? ERCP N/A 03/27/2020  ? Procedure: ENDOSCOPIC RETROGRADE CHOLANGIOPANCREATOGRAPHY (ERCP);  Surgeon: Lucilla Lame, MD;  Location: [facility] ENDOSCOPY;  Service: Endoscopy;  Laterality: N/A;  ? EUS N/A 11/29/2019  ? Procedure: FULL UPPER ENDOSCOPIC ULTRASOUND (EUS) RADIAL;  Surgeon: Reita Cliche, MD;  Location: [facility] ENDOSCOPY;  Service: Gastroenterology;  Laterality: N/A;  ? IR FLUORO RM 30-60 MIN  07/30/2020  ? IR IMAGING GUIDED PORT INSERTION  12/14/2019  ? JEJUNOSTOMY Left  07/15/2020  ? Procedure: JEJUNOSTOMY;  Surgeon: Stark Klein, MD;  Location: [facility];  Service: General;  Laterality: Left;  ? LAPAROSCOPY N/A 07/15/2020  ? Procedure: DIAGNOSTIC LAPAROSCOPY;  Surgeon: Stark Klein, MD;  Location: [facility];  Service: General;  Laterality: N/A;  ? right knee replacement Right [id number]  ? SHOULDER ARTHROSCOPY W/ ROTATOR CUFF REPAIR Right   ? WHIPPLE PROCEDURE N/A 07/15/2020  ? Procedure: WHIPPLE PROCEDURE;  Surgeon: Stark Klein, MD;  Location: [facility];  Service: General;  Laterality: N/A;  ? ? ?SOCIAL HISTORY: ?Social History  ? ?Socioeconomic History  ? Marital status: Divorced  ?  Spouse name: Not on file  ? Number of children: Not on file  ? Years of education: Not on file  ? Highest education level: Not on file  ?Occupational History  ? Not on file  ?Tobacco Use  ? Smoking status: Never  ? Smokeless tobacco: Never  ?Vaping Use  ? Vaping Use: Never used  ?Substance and Sexual Activity  ? Alcohol use: No  ?  Alcohol/week: 0.0 standard drinks  ? Drug use: No  ? Sexual activity: Never  ?Other Topics Concern  ? Not on file  ?Social History Narrative  ? ** Merged History Encounter **  ?    ? Lives in [place]; with mom and son; worked in dietary; never smoked; no alcohol.   ? ?Social Determinants of Health  ? ?Financial Resource Strain: Not on file  ?Food Insecurity: Not on file  ?Transportation Needs: Not on file  ?Physical Activity: Not on file  ?Stress: Not on  file  ?Social Connections: Not on file  ?Intimate Partner Violence: Not on file  ? ? ?FAMILY HISTORY: ?Family History  ?Problem Relation Age of Onset  ? Diabetes Mother   ? Hyperlipidemia Mother   ? Hypertension Mother   ? Vision loss Mother   ? Arthritis Father   ? Hyperlipidemia Father   ? Hypertension Father   ? Breast cancer Maternal Aunt   ? ? ?ALLERGIES:  has No Known Allergies. ? ?MEDICATIONS:  ?Current Outpatient Medications  ?Medication Sig Dispense Refill  ? acetaminophen (TYLENOL) 325 MG tablet Take 650 mg by mouth every  6 (six) hours as needed for moderate pain.    ? amLODipine (NORVASC) 10 MG tablet Take 1 tablet (10 mg total) by mouth daily. 30 tablet 0  ? Calcium Carbonate-Vit D-Min (CALCIUM 600+D PLUS MINERALS PO) Take 1

## 2021-07-24 NOTE — Assessment & Plan Note (Addendum)
#  Pancreas adenocarcinoma-Stage IB-/boderline resectable [Abutment of the SMV s/p neoadjuvant FOLFIRINX. #10 cycles]-s/p Whipple's [on April 19th 2022]- ypT2 [3.5cm]; pN0/11; April 25th, 2023- CT scan abdomen pelvis- No evidence of pancreatic cancer recurrence. No lymphadenopathy.No hepatic metastasis. No peritoneal metastasis. Two low-density collections in the vicinity of the pancreatic enteric anastomosis and biliary anastomosis. Favor benign postsurgical collections. Recommend continued close attention on ?follow-up. STABLE; tumor makers- WNL.  Will repeat CT scan in 4 months; ? ? ?# Anemia- microcytic- Stable 9-10; add iron studies/ferritin today;  colo [EUS- 2021; oct 2022- colo; Dr.Anna];  ?? ?# PN--1-2- on cymbalta- STABLE.  ? ?# Elevated Alk Phos- improved from 212- in APRIL 2023- CT NEG. Monitor for now. STABLE.  ? ?# Hypertension-poorly controlled 201/91]; Continue Norvasc/Metoprlol.[ per pt At home- does not check] ;AGAIN RE-ITERATED to bring log.  ? ?# mediport:  continue port flushes q 47M. discussed re: pro and cons of keeping the port vs. Explantation.   We will keep it for now.- STABLE ? ?# #Incidental findings on CT Imaging dated: APRIL 2023: umbilcal hernia I reviewed/discussed/counseled the patient.  ? ?# DISPOSITION:  ?# port flush in 2 months ?# Follow up in 4 months- MD; labs; cbc/cmp/ca-19-9; port flush; CT AP prior-;Dr.B ? ?# I reviewed the blood work- with the patient in detail; also reviewed the imaging independently [as summarized above]; and with the patient in detail.  ? ? ? ? ?

## 2021-08-24 ENCOUNTER — Other Ambulatory Visit: Payer: Self-pay | Admitting: Oncology

## 2021-08-25 ENCOUNTER — Encounter: Payer: Self-pay | Admitting: Internal Medicine

## 2021-09-23 ENCOUNTER — Inpatient Hospital Stay: Payer: Medicare Other | Attending: Internal Medicine

## 2021-09-23 DIAGNOSIS — Z8507 Personal history of malignant neoplasm of pancreas: Secondary | ICD-10-CM | POA: Insufficient documentation

## 2021-09-23 DIAGNOSIS — Z452 Encounter for adjustment and management of vascular access device: Secondary | ICD-10-CM | POA: Insufficient documentation

## 2021-09-23 DIAGNOSIS — Z95828 Presence of other vascular implants and grafts: Secondary | ICD-10-CM

## 2021-09-23 MED ORDER — HEPARIN SOD (PORK) LOCK FLUSH 100 UNIT/ML IV SOLN
500.0000 [IU] | Freq: Once | INTRAVENOUS | Status: AC
  Administered 2021-09-23: 500 [IU] via INTRAVENOUS
  Filled 2021-09-23: qty 5

## 2021-09-23 MED ORDER — SODIUM CHLORIDE 0.9% FLUSH
10.0000 mL | Freq: Once | INTRAVENOUS | Status: AC
  Administered 2021-09-23: 10 mL via INTRAVENOUS
  Filled 2021-09-23: qty 10

## 2021-10-19 ENCOUNTER — Other Ambulatory Visit: Payer: Self-pay

## 2021-10-24 ENCOUNTER — Other Ambulatory Visit: Payer: Self-pay

## 2021-10-27 ENCOUNTER — Other Ambulatory Visit: Payer: Self-pay

## 2021-10-28 ENCOUNTER — Other Ambulatory Visit: Payer: Self-pay

## 2021-11-04 DIAGNOSIS — G620 Drug-induced polyneuropathy: Secondary | ICD-10-CM | POA: Insufficient documentation

## 2021-11-04 DIAGNOSIS — I700 Atherosclerosis of aorta: Secondary | ICD-10-CM | POA: Insufficient documentation

## 2021-11-16 ENCOUNTER — Ambulatory Visit
Admission: RE | Admit: 2021-11-16 | Discharge: 2021-11-16 | Disposition: A | Discharge status: Home / Self Care | Payer: Medicare Other | Source: Ambulatory Visit | Attending: Internal Medicine | Admitting: Internal Medicine

## 2021-11-16 ENCOUNTER — Ambulatory Visit: Payer: Medicare Other

## 2021-11-16 DIAGNOSIS — C250 Malignant neoplasm of head of pancreas: Secondary | ICD-10-CM | POA: Insufficient documentation

## 2021-11-16 LAB — POCT I-STAT CREATININE: Creatinine, Ser: 0.9 mg/dL (ref 0.44–1.00)

## 2021-11-16 MED ORDER — IOHEXOL 300 MG/ML  SOLN
100.0000 mL | Freq: Once | INTRAMUSCULAR | Status: AC | PRN
  Administered 2021-11-16: 100 mL via INTRAVENOUS

## 2021-11-23 ENCOUNTER — Inpatient Hospital Stay: Payer: Medicare Other | Attending: Internal Medicine

## 2021-11-23 ENCOUNTER — Ambulatory Visit: Payer: Medicare Other | Admitting: Internal Medicine

## 2021-11-23 ENCOUNTER — Inpatient Hospital Stay (HOSPITAL_BASED_OUTPATIENT_CLINIC_OR_DEPARTMENT_OTHER): Payer: Medicare Other | Admitting: Nurse Practitioner

## 2021-11-23 ENCOUNTER — Encounter: Payer: Self-pay | Admitting: Nurse Practitioner

## 2021-11-23 VITALS — BP 179/89 | HR 80 | Temp 96.5°F | Resp 16 | Wt 198.0 lb

## 2021-11-23 DIAGNOSIS — E876 Hypokalemia: Secondary | ICD-10-CM | POA: Diagnosis not present

## 2021-11-23 DIAGNOSIS — Z452 Encounter for adjustment and management of vascular access device: Secondary | ICD-10-CM | POA: Insufficient documentation

## 2021-11-23 DIAGNOSIS — Z95828 Presence of other vascular implants and grafts: Secondary | ICD-10-CM

## 2021-11-23 DIAGNOSIS — Z08 Encounter for follow-up examination after completed treatment for malignant neoplasm: Secondary | ICD-10-CM | POA: Diagnosis present

## 2021-11-23 DIAGNOSIS — C250 Malignant neoplasm of head of pancreas: Secondary | ICD-10-CM

## 2021-11-23 DIAGNOSIS — I119 Hypertensive heart disease without heart failure: Secondary | ICD-10-CM | POA: Diagnosis not present

## 2021-11-23 DIAGNOSIS — Z79899 Other long term (current) drug therapy: Secondary | ICD-10-CM | POA: Diagnosis not present

## 2021-11-23 DIAGNOSIS — Z8507 Personal history of malignant neoplasm of pancreas: Secondary | ICD-10-CM

## 2021-11-23 DIAGNOSIS — G629 Polyneuropathy, unspecified: Secondary | ICD-10-CM | POA: Insufficient documentation

## 2021-11-23 DIAGNOSIS — D509 Iron deficiency anemia, unspecified: Secondary | ICD-10-CM | POA: Insufficient documentation

## 2021-11-23 LAB — COMPREHENSIVE METABOLIC PANEL
ALT: 15 U/L (ref 0–44)
AST: 21 U/L (ref 15–41)
Albumin: 3.8 g/dL (ref 3.5–5.0)
Alkaline Phosphatase: 120 U/L (ref 38–126)
Anion gap: 8 (ref 5–15)
BUN: 16 mg/dL (ref 8–23)
CO2: 25 mmol/L (ref 22–32)
Calcium: 8.6 mg/dL — ABNORMAL LOW (ref 8.9–10.3)
Chloride: 104 mmol/L (ref 98–111)
Creatinine, Ser: 0.78 mg/dL (ref 0.44–1.00)
GFR, Estimated: >60 mL/min (ref >60)
Glucose, Bld: 108 mg/dL — ABNORMAL HIGH (ref 70–99)
Potassium: 3.2 mmol/L — ABNORMAL LOW (ref 3.5–5.1)
Sodium: 137 mmol/L (ref 135–145)
Total Bilirubin: 0.4 mg/dL (ref 0.3–1.2)
Total Protein: 8 g/dL (ref 6.5–8.1)

## 2021-11-23 LAB — CBC WITH DIFFERENTIAL/PLATELET
Abs Immature Granulocytes: 0.01 10*3/uL (ref 0.00–0.07)
Basophils Absolute: 0 10*3/uL (ref 0.0–0.1)
Basophils Relative: 1 %
Eosinophils Absolute: 0.1 10*3/uL (ref 0.0–0.5)
Eosinophils Relative: 2 %
HCT: 33 % — ABNORMAL LOW (ref 36.0–46.0)
Hemoglobin: 10.5 g/dL — ABNORMAL LOW (ref 12.0–15.0)
Immature Granulocytes: 0 %
Lymphocytes Relative: 40 %
Lymphs Abs: 2.3 10*3/uL (ref 0.7–4.0)
MCH: 23.8 pg — ABNORMAL LOW (ref 26.0–34.0)
MCHC: 31.8 g/dL (ref 30.0–36.0)
MCV: 74.8 fL — ABNORMAL LOW (ref 80.0–100.0)
Monocytes Absolute: 0.5 10*3/uL (ref 0.1–1.0)
Monocytes Relative: 9 %
Neutro Abs: 2.8 10*3/uL (ref 1.7–7.7)
Neutrophils Relative %: 48 %
Platelets: 327 10*3/uL (ref 150–400)
RBC: 4.41 MIL/uL (ref 3.87–5.11)
RDW: 15.8 % — ABNORMAL HIGH (ref 11.5–15.5)
WBC: 5.7 10*3/uL (ref 4.0–10.5)
nRBC: 0 % (ref 0.0–0.2)

## 2021-11-23 MED ORDER — HEPARIN SOD (PORK) LOCK FLUSH 100 UNIT/ML IV SOLN
500.0000 [IU] | Freq: Once | INTRAVENOUS | Status: AC
  Administered 2021-11-23: 500 [IU] via INTRAVENOUS
  Filled 2021-11-23: qty 5

## 2021-11-23 MED ORDER — SODIUM CHLORIDE 0.9% FLUSH
10.0000 mL | Freq: Once | INTRAVENOUS | Status: AC
  Administered 2021-11-23: 10 mL via INTRAVENOUS
  Filled 2021-11-23: qty 10

## 2021-11-23 NOTE — Progress Notes (Signed)
[place] NOTE  Patient Care Team: [place] as PCP - General [name], [place], [place] (Family Medicine) Rico Junker, RN as Registered Nurse [place], Alyssa Grove, RN (Inactive) as Registered Nurse Clent Jacks, RN as Oncology Nurse Navigator Stark Klein, MD as Consulting Physician (General Surgery) Cammie Sickle, MD as Consulting Physician (Internal Medicine)  CHIEF COMPLAINTS/PURPOSE OF CONSULTATION: adenocarcinoma of pancreas  Oncology History Overview Note  #Pancreas adenocarcinoma-Stage IB- [id number]-[EUS- Dr.Spaete; [facility]/GI; mass 22x51m; invading/abutting superior mesenteric vein; 2 enlarged lymph nodes peripancreatic/porta hepatis largest 10 x 5.8 mm-nonpathologic based on EUS criteria/no biopsy]-borderline resectable.  PET scan no evidence of distant metastatic disease.  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 BStevan BornSeptember]  # 01/02/2020-FOLFIRINOX; Neulasta. S/p FOLFIRINOX 10 cycles- April 19th whipples-  s/p neoadjuvant FOLFIRINX. #10 cycles]-s/p Whipple's [on April 19th 2022]- ypT2 [3.5cm]; pN0/11;   DIS-CONTINUED  FOLFIRINOX cycle #11 & 12-  Sec to PN-2-3.  PANCREAS (EXOCRINE), CARCINOMA: Resection  Procedure: Whipple procedure  Tumor Site: Head of pancreas.  Tumor Size: 3.5 cm, slide measurement.  Histologic Type: Pancreatic ductal adenocarcinoma.  Histologic Grade: Moderately differentiated.  Tumor Extension: Into peripancreatic connective tissue.  Treatment Effect: Moderate treatment effect.  Lymphovascular Invasion: Not identified.  Perineural Invasion: Present.  Margins: All surgical margins are negative for carcinoma.  Regional Lymph Nodes:       Number of Lymph Nodes with Tumor: 0       Number of Lymph Nodes Examined: 15  Distant Metastasis:       Distant Site(s)  Involved: Not applicable.  Pathologic Stage Classification (pTNM, AJCC 8th Edition): ypT2, ypN0   # Borderline DM; HTN   # SURVIVORSHIP:   # GENETICS:   DIAGNOSIS: Pancreatic cancer  STAGE:  IB/borderline resectable;  GOALS: cure    Can[place] of head of pancreas [place]  12/04/2019 Initial Diagnosis   [place] head of pancreas ([place]   12/19/2019 - 12/19/2019 Chemotherapy   The patient had PACLitaxel-protein bound (ABRAXANE) chemo infusion 250 mg, 125 mg/m2 = 250 mg, Intravenous,  Once, 1 of 4 cycles Administration: 250 mg (12/19/2019) gemcitabine (GEMZAR) 2,000 mg in sodium chloride 0.9 % 250 mL chemo infusion, 1,976 mg, Intravenous,  Once, 1 of 4 cycles Administration: 2,000 mg (12/19/2019)  for chemotherapy treatment.    01/02/2020 - 05/15/2020 Chemotherapy   Patient is on Treatment Plan : PANCREAS Modified FOLFIRINOX q14d x 4 cycles      HISTORY OF PRESENTING ILLNESS:  LTUERE [id number].o.  female borderline resectable pancreatic adenocarcinoma, s/p neoadjuvant chemotherapy with FOLFIRINOX x 10 followed by Whipple on 07/15/20, currently on surveillance who returns to clinic for follow up and discussion of CT results.   She continues cymbalta for peripheral neuropathy which is unchanged. No abdominal pain, nausea, vomiting. No changes in bowel movements. No fevers, chills, or unexpected weight loss.    Review of Systems  Constitutional:  Negative for chills, diaphoresis, fever and malaise/fatigue.  HENT:  Negative for nosebleeds and sore throat.   Eyes:  Negative for double vision.  Respiratory:  Negative for cough, hemoptysis, sputum production, shortness of breath and wheezing.   Cardiovascular:  Negative for chest pain, palpitations, orthopnea and leg swelling.  Gastrointestinal:  Negative for abdominal pain, blood in stool, constipation, diarrhea, heartburn, melena, nausea and vomiting.  Genitourinary:  Negative for dysuria, frequency and urgency.  Musculoskeletal:  Negative for  back pain and joint pain.  Skin: Negative.  Negative for itching and rash.  Neurological:  Positive for tingling. Negative for dizziness, focal weakness, weakness and headaches.  Endo/Heme/Allergies:  Does not bruise/bleed easily.  Psychiatric/Behavioral:  Negative for depression. The patient is not nervous/anxious and does not have insomnia.      MEDICAL HISTORY:  Past Medical History:  Diagnosis Date   Anemia    history of   Anxiety 09/11/2014   Arthritis    Cancer of head of pancreas ([place]) 70/03/7492   Complication of anesthesia    Fluttering heart    Hyperlipidemia    Hypertension    PONV (postoperative nausea and vomiting)    Pre-diabetes    Vertigo    none recently    SURGICAL HISTORY: Past Surgical History:  Procedure Laterality Date   CATARACT EXTRACTION W/PHACO Right 06/09/2021   Procedure: CATARACT EXTRACTION PHACO AND INTRAOCULAR LENS PLACEMENT ([place]) RIGHT malyugin;  Surgeon: Birder Robson, MD;  Location: [place];  Service: Ophthalmology;  Laterality: Right;  12.40 1:07.0   COLONOSCOPY WITH PROPOFOL N/A 01/13/2021   Procedure: COLONOSCOPY WITH PROPOFOL;  Surgeon: Jonathon Bellows, MD;  Location[facility] ENDOSCOPY;  Service: Gastroenterology;  Laterality: N/A;   ENDOSCOPIC RETROGRADE CHOLANGIOPANCREATOGRAPHY (ERCP) WITH PROPOFOL N/A 11/16/2019   Procedure: ENDOSCOPIC RETROGRADE CHOLANGIOPANCREATOGRAPHY (ERCP) WITH PROPOFOL;  Surgeon: Lucilla Lame, MD;  Location: [facility] ENDOSCOPY;  Service: Endoscopy;  Laterality: N/A;   ERCP N/A 03/27/2020   Procedure: ENDOSCOPIC RETROGRADE CHOLANGIOPANCREATOGRAPHY (ERCP);  Surgeon: Lucilla Lame, MD;  Location: [facility] ENDOSCOPY;  Service: Endoscopy;  Laterality: N/A;   EUS N/A 11/29/2019   Procedure: FULL UPPER ENDOSCOPIC ULTRASOUND (EUS) RADIAL;  Surgeon: Reita Cliche, MD;  Location: [facility] ENDOSCOPY;  Service: Gastroenterology;  Laterality: N/A;   IR FLUORO RM 30-60 MIN  07/30/2020   IR IMAGING GUIDED PORT INSERTION  12/14/2019    JEJUNOSTOMY Left 07/15/2020   Procedure: Shanon Rosser;  Surgeon: Stark Klein, MD;  Location: [place];  Service: General;  Laterality: Left;   LAPAROSCOPY N/A 07/15/2020   Procedure: DIAGNOSTIC LAPAROSCOPY;  Surgeon: Stark Klein, MD;  Location: [place];  Service: General;  Laterality: N/A;   right knee replacement Right [id number]   SHOULDER ARTHROSCOPY W/ ROTATOR CUFF REPAIR Right    WHIPPLE PROCEDURE N/A 07/15/2020   Procedure: WHIPPLE PROCEDURE;  Surgeon: Stark Klein, MD;  Location: [place];  Service: General;  Laterality: N/A;    SOCIAL HISTORY: Social History   Socioeconomic History   Marital status: Divorced    Spouse name: Not on file   Number of children: Not on file   Years of education: Not on file   Highest education level: Not on file  Occupational History   Not on file  Tobacco Use   Smoking status: Never   Smokeless tobacco: Never  Vaping Use   Vaping Use: Never used  Substance and Sexual Activity   Alcohol use: No    Alcohol/week: 0.0 standard drinks of alcohol   Drug use: No   Sexual activity: Never  Other Topics Concern   Not on file  Social History Narrative   ** Merged History Encounter **       Lives in [place]; with mom and son; worked in dietary; never smoked; no alcohol.    Social Determinants of Health   Financial Resource Strain: Not on file  Food Insecurity: Not on file  Transportation Needs: Not on file  Physical Activity: Not on file  Stress: Not on file  Social Connections: Not on  file  Intimate Partner Violence: Not on file    FAMILY HISTORY: Family History  Problem Relation Age of Onset   Diabetes Mother    Hyperlipidemia Mother    Hypertension Mother    Vision loss Mother    Arthritis Father    Hyperlipidemia Father    Hypertension Father    Breast cancer Maternal Aunt     ALLERGIES:  has No Known Allergies.  MEDICATIONS:  Current Outpatient Medications  Medication Sig Dispense Refill   acetaminophen (TYLENOL) 325 MG  tablet Take 650 mg by mouth every 6 (six) hours as needed for moderate pain.     amLODipine (NORVASC) 10 MG tablet Take 1 tablet (10 mg total) by mouth daily. 30 tablet 0   Calcium Carbonate-Vit D-Min (CALCIUM 600+D PLUS MINERALS PO) Take 1 tablet by mouth in the morning and at bedtime.     DULoxetine (CYMBALTA) 20 MG capsule Take 1 capsule by mouth once daily (Patient not taking: Reported on 11/23/2021) 30 capsule 0   gabapentin (NEURONTIN) 300 MG capsule Take 300 mg by mouth daily.     lisinopril (ZESTRIL) 10 MG tablet Take 10 mg by mouth daily.     metoprolol succinate (TOPROL-XL) 50 MG 24 hr tablet Take 1 tablet (50 mg total) by mouth daily. Take with or immediately following a meal. 30 tablet 0   Potassium Chloride ER 20 MEQ TBCR Take 20 mEq by mouth in the morning and at bedtime. 60 tablet 1   vitamin B-12 (CYANOCOBALAMIN) 1000 MCG tablet Take 1 tablet (1,000 mcg total) by mouth daily.     No current facility-administered medications for this visit.   Facility-Administered Medications Ordered in Other Visits  Medication Dose Route Frequency Provider Last Rate Last Admin   sodium chloride flush (NS) 0.9 % injection 10 mL  10 mL Intravenous PRN Charlaine Dalton R, MD   10 mL at 06/23/21 1012    PHYSICAL EXAMINATION: ECOG PERFORMANCE STATUS: 1 - Symptomatic but completely ambulatory  Vitals:   11/23/21 1036  BP: (!) 179/89  Pulse: 80  Resp: 16  Temp: (!) 96.5 F (35.8 C)  SpO2: 100%   Filed Weights   11/23/21 1036  Weight: 198 lb (89.8 kg)    Physical Exam HENT:     Head: Normocephalic and atraumatic.     Mouth/Throat:     Pharynx: No oropharyngeal exudate.  Eyes:     Pupils: Pupils are equal, round, and reactive to light.  Cardiovascular:     Rate and Rhythm: Normal rate and regular rhythm.  Pulmonary:     Effort: Pulmonary effort is normal. No respiratory distress.     Breath sounds: Normal breath sounds. No wheezing.  Abdominal:     General: Bowel sounds are  normal. There is no distension.     Palpations: Abdomen is soft. There is no mass.     Tenderness: There is no abdominal tenderness. There is no guarding or rebound.  Musculoskeletal:        General: No tenderness. Normal range of motion.     Cervical back: Normal range of motion and neck supple.  Skin:    General: Skin is warm.  Neurological:     Mental Status: She is alert and oriented to person, place, and time.  Psychiatric:        Mood and Affect: Affect normal.     LABORATORY DATA:  I have reviewed the data as listed Lab Results  Component Value Date   WBC 5.7  11/23/2021   HGB 10.5 (L) 11/23/2021   HCT 33.0 (L) 11/23/2021   MCV 74.8 (L) 11/23/2021   PLT 327 11/23/2021   Recent Labs    04/20/21 0919 06/23/21 1004 11/16/21 0907 11/23/21 1007  NA 137 137  --  137  K 3.8 3.5  --  3.2*  CL 101 104  --  104  CO2 27 26  --  25  GLUCOSE 107* 110*  --  108*  BUN 12 10  --  16  CREATININE 0.63 0.71 0.90 0.78  CALCIUM 9.3 8.8*  --  8.6*  GFRNONAA >60 >60  --  >60  PROT 8.2* 7.6  --  8.0  ALBUMIN 3.9 3.7  --  3.8  AST 23 23  --  21  ALT 21 16  --  15  ALKPHOS 131* 112  --  120  BILITOT 0.4 0.3  --  0.4    Component Ref Range & Units 5 mo ago (06/23/21) 7 mo ago (04/20/21) 10 mo ago (01/23/21) 1 yr ago (10/24/20) 1 yr ago (09/05/20) 1 yr ago (08/22/20) 1 yr ago (08/08/20)  CA 19-9 0 - 35 U/mL 14  13 CM  16 CM  16 CM  17 CM  14 CM  11 CM     RADIOGRAPHIC STUDIES: I have personally reviewed the radiological images as listed and agreed with the findings in the report. CT ABDOMEN PELVIS W CONTRAST  Result Date: 11/17/2021 CLINICAL DATA:  Status post Whipple procedure done in 2022 for pancreatic cancer. Asymptomatic. EXAM: CT ABDOMEN AND PELVIS WITH CONTRAST TECHNIQUE: Multidetector CT imaging of the abdomen and pelvis was performed using the standard protocol following bolus administration of intravenous contrast. RADIATION DOSE REDUCTION: This exam was performed  according to the departmental dose-optimization program which includes automated exposure control, adjustment of the mA and/or kV according to patient size and/or use of iterative reconstruction technique. CONTRAST:  147m OMNIPAQUE IOHEXOL 300 MG/ML  SOLN COMPARISON:  07/21/2021 FINDINGS: Lower chest: Clear lung bases. Borderline cardiomegaly. 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 Hepatobiliary: No focal liver lesion. Pneumobilia is similar. [place] jejunostomy. Pancreas: Status post Whipple procedure, including pancreatic head resection and pancreatic neck to jejunal anastomosis. The pancreatic duct stent is similar in position. The lower density foci on the prior exam are decreased. Example maximally 1.2 cm on 24/2 versus 1.9 cm on the prior exam. No findings to suggest local recurrence. No peripancreatic edema. Spleen: Normal in size, without focal abnormality. Adrenals/Urinary Tract: Normal adrenal glands. Normal kidneys, without hydronephrosis. Normal urinary bladder. Stomach/Bowel: Gastrojejunostomy. Scattered colonic diverticula. Normal terminal ileum and appendix. Again identified is small bowel within a right paramidline area of abdominal wall laxity on 44/2. No obstruction or other acute complication. Otherwise normal small bowel. Vascular/Lymphatic: Aortic atherosclerosis. Small nodes within the jejunal mesentery are unchanged. No abdominopelvic adenopathy. Reproductive: Normal uterus and adnexa. Other: No significant free fluid. No evidence of omental or peritoneal disease. Musculoskeletal: No acute osseous abnormality. Trace L4-5 anterolisthesis. IMPRESSION: 1. Status post Whipple procedure, without findings of recurrent or metastatic disease. The low-density areas detailed on the prior exam are less conspicuous today, likely postoperative. 2. Similar nonobstructive small bowel within an area of right paramidline ventral abdominal wall laxity. 3.  Aortic Atherosclerosis (ICD10-I70.0).  Electronically Signed   By: KAbigail MiyamotoM.D.   On: 11/17/2021 10:57    ASSESSMENT & PLAN:   No problem-specific Assessment & Plan notes found for this encounter.  Cancer of head of  pancreas ([place]) #  Pancreas adenocarcinoma-Stage IB-/boderline resectable [Abutment of the SMV s/p neoadjuvant FOLFIRINX. #10 cycles]-s/p Whipple's [on April 19th 2022]- ypT2 [3.5cm]; pN0/11; April 25th, 2023- CT scan abdomen pelvis- No evidence of pancreatic cancer recurrence. No lymphadenopathy.No hepatic metastasis. No peritoneal metastasis. Two low-density collections in the vicinity of the pancreatic enteric anastomosis and biliary anastomosis. Favor benign postsurgical collections. CT from 11/16/21 independently reviewed- Postsurgical low density accumulations have now improved on repeat scan in August 2023. No evidence of malignancy. Clinically, asymptomatic. Tumor marker pending today but has been WNL. Continue surveillance. Repeat CT in 4 months.    # Anemia- microcytic- Stable. Hemoglobin 10.5. Ferritin and iron studies were stable. [place] in 2021 and 2022 with Dr. Vicente Males.     # PN--1-2- on cymbalta- STABLE.    # Elevated Alk Phos- improved from 212- in APRIL 2023- CT NEG. Monitor for now. STABLE.    # Hypertension-poorly controlled 201/91- now 179/89]; Continue Norvasc/Metoprlol. Monitor at home and keep log of bp. Per patient, white coat syndrome. Follow up with PCP.   # weight gain- 8 lb weight gain. Follow up with pcp. Dietary discretion encouraged.    # Mediport: continue port flushes every 2-3 months. Discussed re: pro and cons of keeping the port vs. Explantation. We will keep it for now.- STABLE   # Incidental findings on CT Imaging dated: APRIL 2023: umbilcal hernia. Again seen on imaging. Clinically asymptomatic. Reviewed monitoring. Avoid constipation.    # Hypokalemia- K 3.2. Encouraged K rich foods.   # Hypocalcemia- encouraged calcium supplement with vitamin D. Can follow up with pcp for  monitoring.    DISPOSITION:  # 2 mo- port flush 15 weeks- CT A/P Week later/4 mo- port/lab (cbc, cmp, ca19-9), Dr. Rogue Bussing- la  All questions were answered. The patient knows to call the clinic with any problems, questions or concerns.   Verlon Au, NP 11/23/2021

## 2021-11-23 NOTE — Progress Notes (Signed)
Pt returns for CT scan results. She has no new concerns at this time. Anxious about her CT scan results. Pt states that she has a new PCP at [facility] and was told that she lad low iron levels.

## 2021-11-24 LAB — CANCER ANTIGEN 19-9: CA 19-9: 12 U/mL (ref 0–35)

## 2022-01-22 ENCOUNTER — Inpatient Hospital Stay: Payer: Medicare Other | Attending: Internal Medicine

## 2022-01-22 DIAGNOSIS — Z08 Encounter for follow-up examination after completed treatment for malignant neoplasm: Secondary | ICD-10-CM | POA: Diagnosis present

## 2022-01-22 DIAGNOSIS — Z8507 Personal history of malignant neoplasm of pancreas: Secondary | ICD-10-CM | POA: Insufficient documentation

## 2022-01-22 DIAGNOSIS — C250 Malignant neoplasm of head of pancreas: Secondary | ICD-10-CM

## 2022-01-22 DIAGNOSIS — Z95828 Presence of other vascular implants and grafts: Secondary | ICD-10-CM

## 2022-01-22 LAB — CBC WITH DIFFERENTIAL/PLATELET
Abs Immature Granulocytes: 0.01 10*3/uL (ref 0.00–0.07)
Basophils Absolute: 0 10*3/uL (ref 0.0–0.1)
Basophils Relative: 0 %
Eosinophils Absolute: 0.1 10*3/uL (ref 0.0–0.5)
Eosinophils Relative: 1 %
HCT: 31.7 % — ABNORMAL LOW (ref 36.0–46.0)
Hemoglobin: 9.9 g/dL — ABNORMAL LOW (ref 12.0–15.0)
Immature Granulocytes: 0 %
Lymphocytes Relative: 36 %
Lymphs Abs: 1.7 10*3/uL (ref 0.7–4.0)
MCH: 23.8 pg — ABNORMAL LOW (ref 26.0–34.0)
MCHC: 31.2 g/dL (ref 30.0–36.0)
MCV: 76.2 fL — ABNORMAL LOW (ref 80.0–100.0)
Monocytes Absolute: 0.4 10*3/uL (ref 0.1–1.0)
Monocytes Relative: 8 %
Neutro Abs: 2.6 10*3/uL (ref 1.7–7.7)
Neutrophils Relative %: 55 %
Platelets: 322 10*3/uL (ref 150–400)
RBC: 4.16 MIL/uL (ref 3.87–5.11)
RDW: 15.9 % — ABNORMAL HIGH (ref 11.5–15.5)
WBC: 4.7 10*3/uL (ref 4.0–10.5)
nRBC: 0 % (ref 0.0–0.2)

## 2022-01-22 LAB — COMPREHENSIVE METABOLIC PANEL
ALT: 15 U/L (ref 0–44)
AST: 24 U/L (ref 15–41)
Albumin: 3.5 g/dL (ref 3.5–5.0)
Alkaline Phosphatase: 105 U/L (ref 38–126)
Anion gap: 8 (ref 5–15)
BUN: 14 mg/dL (ref 8–23)
CO2: 24 mmol/L (ref 22–32)
Calcium: 8.6 mg/dL — ABNORMAL LOW (ref 8.9–10.3)
Chloride: 107 mmol/L (ref 98–111)
Creatinine, Ser: 0.83 mg/dL (ref 0.44–1.00)
GFR, Estimated: >60 mL/min (ref >60)
Glucose, Bld: 135 mg/dL — ABNORMAL HIGH (ref 70–99)
Potassium: 3.8 mmol/L (ref 3.5–5.1)
Sodium: 139 mmol/L (ref 135–145)
Total Bilirubin: 0.4 mg/dL (ref 0.3–1.2)
Total Protein: 7.4 g/dL (ref 6.5–8.1)

## 2022-01-22 MED ORDER — SODIUM CHLORIDE 0.9% FLUSH
10.0000 mL | Freq: Once | INTRAVENOUS | Status: AC
  Administered 2022-01-22: 10 mL via INTRAVENOUS
  Filled 2022-01-22: qty 10

## 2022-01-22 MED ORDER — HEPARIN SOD (PORK) LOCK FLUSH 100 UNIT/ML IV SOLN
500.0000 [IU] | Freq: Once | INTRAVENOUS | Status: AC
  Administered 2022-01-22: 500 [IU] via INTRAVENOUS
  Filled 2022-01-22: qty 5

## 2022-01-25 LAB — CANCER ANTIGEN 19-9: CA 19-9: 17 U/mL (ref 0–35)

## 2022-03-18 ENCOUNTER — Ambulatory Visit
Admission: RE | Admit: 2022-03-18 | Discharge: 2022-03-18 | Disposition: A | Discharge status: Home / Self Care | Payer: Medicare Other | Source: Ambulatory Visit | Attending: Nurse Practitioner | Admitting: Nurse Practitioner

## 2022-03-18 DIAGNOSIS — Z8507 Personal history of malignant neoplasm of pancreas: Secondary | ICD-10-CM | POA: Diagnosis present

## 2022-03-18 DIAGNOSIS — Z08 Encounter for follow-up examination after completed treatment for malignant neoplasm: Secondary | ICD-10-CM | POA: Diagnosis present

## 2022-03-18 DIAGNOSIS — C250 Malignant neoplasm of head of pancreas: Secondary | ICD-10-CM | POA: Insufficient documentation

## 2022-03-18 LAB — POCT I-STAT CREATININE: Creatinine, Ser: 0.8 mg/dL (ref 0.44–1.00)

## 2022-03-18 MED ORDER — IOHEXOL 300 MG/ML  SOLN
100.0000 mL | Freq: Once | INTRAMUSCULAR | Status: AC | PRN
  Administered 2022-03-18: 100 mL via INTRAVENOUS

## 2022-03-26 ENCOUNTER — Inpatient Hospital Stay: Payer: Medicare Other | Attending: Internal Medicine

## 2022-03-26 ENCOUNTER — Inpatient Hospital Stay: Payer: Medicare Other | Admitting: Internal Medicine

## 2022-03-26 ENCOUNTER — Inpatient Hospital Stay: Payer: Medicare Other

## 2022-03-26 ENCOUNTER — Encounter: Payer: Self-pay | Admitting: Internal Medicine

## 2022-03-26 ENCOUNTER — Inpatient Hospital Stay (HOSPITAL_BASED_OUTPATIENT_CLINIC_OR_DEPARTMENT_OTHER): Payer: Medicare Other | Admitting: Internal Medicine

## 2022-03-26 VITALS — BP 160/84 | HR 97 | Temp 98.6°F | Resp 20 | Wt 200.7 lb

## 2022-03-26 DIAGNOSIS — R918 Other nonspecific abnormal finding of lung field: Secondary | ICD-10-CM | POA: Diagnosis not present

## 2022-03-26 DIAGNOSIS — Z452 Encounter for adjustment and management of vascular access device: Secondary | ICD-10-CM | POA: Diagnosis not present

## 2022-03-26 DIAGNOSIS — Z8507 Personal history of malignant neoplasm of pancreas: Secondary | ICD-10-CM | POA: Insufficient documentation

## 2022-03-26 DIAGNOSIS — C250 Malignant neoplasm of head of pancreas: Secondary | ICD-10-CM

## 2022-03-26 DIAGNOSIS — F419 Anxiety disorder, unspecified: Secondary | ICD-10-CM | POA: Insufficient documentation

## 2022-03-26 DIAGNOSIS — Z79899 Other long term (current) drug therapy: Secondary | ICD-10-CM | POA: Insufficient documentation

## 2022-03-26 DIAGNOSIS — Z08 Encounter for follow-up examination after completed treatment for malignant neoplasm: Secondary | ICD-10-CM | POA: Insufficient documentation

## 2022-03-26 DIAGNOSIS — D649 Anemia, unspecified: Secondary | ICD-10-CM | POA: Insufficient documentation

## 2022-03-26 DIAGNOSIS — Z95828 Presence of other vascular implants and grafts: Secondary | ICD-10-CM

## 2022-03-26 LAB — CBC WITH DIFFERENTIAL/PLATELET
Abs Immature Granulocytes: 0.01 10*3/uL (ref 0.00–0.07)
Basophils Absolute: 0 10*3/uL (ref 0.0–0.1)
Basophils Relative: 0 %
Eosinophils Absolute: 0.1 10*3/uL (ref 0.0–0.5)
Eosinophils Relative: 2 %
HCT: 30.5 % — ABNORMAL LOW (ref 36.0–46.0)
Hemoglobin: 10 g/dL — ABNORMAL LOW (ref 12.0–15.0)
Immature Granulocytes: 0 %
Lymphocytes Relative: 41 %
Lymphs Abs: 2 10*3/uL (ref 0.7–4.0)
MCH: 24.2 pg — ABNORMAL LOW (ref 26.0–34.0)
MCHC: 32.8 g/dL (ref 30.0–36.0)
MCV: 73.8 fL — ABNORMAL LOW (ref 80.0–100.0)
Monocytes Absolute: 0.4 10*3/uL (ref 0.1–1.0)
Monocytes Relative: 7 %
Neutro Abs: 2.4 10*3/uL (ref 1.7–7.7)
Neutrophils Relative %: 50 %
Platelets: 309 10*3/uL (ref 150–400)
RBC: 4.13 MIL/uL (ref 3.87–5.11)
RDW: 15.7 % — ABNORMAL HIGH (ref 11.5–15.5)
WBC: 4.9 10*3/uL (ref 4.0–10.5)
nRBC: 0 % (ref 0.0–0.2)

## 2022-03-26 LAB — COMPREHENSIVE METABOLIC PANEL
ALT: 12 U/L (ref 0–44)
AST: 21 U/L (ref 15–41)
Albumin: 3.7 g/dL (ref 3.5–5.0)
Alkaline Phosphatase: 100 U/L (ref 38–126)
Anion gap: 9 (ref 5–15)
BUN: 15 mg/dL (ref 8–23)
CO2: 24 mmol/L (ref 22–32)
Calcium: 8.7 mg/dL — ABNORMAL LOW (ref 8.9–10.3)
Chloride: 104 mmol/L (ref 98–111)
Creatinine, Ser: 0.92 mg/dL (ref 0.44–1.00)
GFR, Estimated: >60 mL/min (ref >60)
Glucose, Bld: 130 mg/dL — ABNORMAL HIGH (ref 70–99)
Potassium: 3.5 mmol/L (ref 3.5–5.1)
Sodium: 137 mmol/L (ref 135–145)
Total Bilirubin: 0.4 mg/dL (ref 0.3–1.2)
Total Protein: 7.6 g/dL (ref 6.5–8.1)

## 2022-03-26 MED ORDER — SODIUM CHLORIDE 0.9% FLUSH
10.0000 mL | Freq: Once | INTRAVENOUS | Status: AC
  Administered 2022-03-26: 10 mL via INTRAVENOUS
  Filled 2022-03-26: qty 10

## 2022-03-26 MED ORDER — HEPARIN SOD (PORK) LOCK FLUSH 100 UNIT/ML IV SOLN
500.0000 [IU] | Freq: Once | INTRAVENOUS | Status: AC
  Administered 2022-03-26: 500 [IU] via INTRAVENOUS
  Filled 2022-03-26: qty 5

## 2022-03-26 NOTE — Assessment & Plan Note (Addendum)
#    Pancreas adenocarcinoma-Stage IB-/boderline resectable [Abutment of the SMV s/p neoadjuvant FOLFIRINX. #10 cycles]-s/p Whipple's [on April 19th 2022]- ypT2 [3.5cm]; pN0/11-CT AP- DEC 25th, 2023- Stable postoperative appearance status post Whipple procedure. No evidence of local recurrence or metastatic disease in the abdomen or pelvis.Tiny nodules at both lung bases, not clearly seen previously.  Recommended CT scan of the chest in 3 months.   # Anemia- microcytic- Stable 9-10; add iron studies/ferritin today;  colo [EUS- 2021; oct 2022- colo; Dr.Anna]; STABLE.    # PN--1-2- on cymbalta- STABLE.   # Hypertension-poorly controlled 201/91]; Continue Norvasc/Metoprlol.[ per pt At home- does not check] ;AGAIN RE-ITERATED to bring log. STABLE  # mediport:  continue port flushes q 2-19M. discussed re: pro and cons of keeping the port vs. Explantation.   We will keep it for now.- STABLE  # #Incidental findings on CT Imaging dated: DEC [id number]; umbilical hernia I reviewed/discussed/counseled the patient.   # DISPOSITION:  # port flush in 6  weeks # Follow up in 3 months- MD; labs; cbc/cmp/ca-19-9; port flush; CT chest  prior-Dr.B  # I reviewed the blood work- with the patient in detail; also reviewed the imaging independently [as summarized above]; and with the patient in detail.

## 2022-03-26 NOTE — Progress Notes (Signed)
[place] CONSULT NOTE  Patient Care Team: Entzminger, Mendel Corning, MD as PCP - General (Internal Medicine) Gennette Pac, [place] (Family Medicine) Rico Junker, RN as Registered Nurse Theodore Demark, RN (Inactive) as Registered Nurse Clent Jacks, RN as Oncology Nurse Navigator Stark Klein, MD as Consulting Physician (General Surgery) Cammie Sickle, MD as Consulting Physician (Internal Medicine)  CHIEF COMPLAINTS/PURPOSE OF CONSULTATION: pancreas adenocarcinoma   Oncology History Overview Note  #Pancreas adenocarcinoma-Stage IB- [id number]-[EUS- Dr.Spaete; [facility]/GI; mass 22x22m; invading/abutting superior mesenteric vein; 2 enlarged lymph nodes peripancreatic/porta hepatis largest 10 x 5.8 mm-nonpathologic based on EUS criteria/no biopsy]-borderline resectable.  PET scan no evidence of distant metastatic disease.  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 BStevan BornSeptember]  # 01/02/2020-FOLFIRINOX; Neulasta. S/p FOLFIRINOX 10 cycles- April 19th whipples-  s/p neoadjuvant FOLFIRINX. #10 cycles]-s/p Whipple's [on April 19th 2022]- ypT2 [3.5cm]; pN0/11;   DIS-CONTINUED  FOLFIRINOX cycle #11 & 12-  Sec to PN-2-3.  PANCREAS (EXOCRINE), CARCINOMA: Resection  Procedure: Whipple procedure  Tumor Site: Head of pancreas.  Tumor Size: 3.5 cm, slide measurement.  Histologic Type: Pancreatic ductal adenocarcinoma.  Histologic Grade: Moderately differentiated.  Tumor Extension: Into peripancreatic connective tissue.  Treatment Effect: Moderate treatment effect.  Lymphovascular Invasion: Not identified.  Perineural Invasion: Present.  Margins: All surgical margins are negative for carcinoma.  Regional Lymph Nodes:       Number of Lymph Nodes with Tumor: 0       Number of Lymph Nodes Examined: 15  Distant Metastasis:        Distant Site(s) Involved: Not applicable.  Pathologic Stage Classification (pTNM, AJCC 8th Edition): ypT2, ypN0   # Borderline DM; HTN   # SURVIVORSHIP:   # GENETICS:   DIAGNOSIS: Pancreatic cancer  STAGE:  IB/borderline resectable;  GOALS: cure    [place] head of pancreas [name]  12/[place] Initial Diagnosis   [place] of head of pancreas ([name]   12/19/2019 - 12/19/2019 Chemotherapy   The patient had PACLitaxel-protein bound (ABRAXANE) chemo infusion 250 mg, 125 mg/m2 = 250 mg, Intravenous,  Once, 1 of 4 cycles Administration: 250 mg (12/19/2019) gemcitabine (GEMZAR) 2,000 mg in sodium chloride 0.9 % 250 mL chemo infusion, 1,976 mg, Intravenous,  Once, 1 of 4 cycles Administration: 2,000 mg (12/19/2019)  for chemotherapy treatment.    01/02/2020 - 05/15/2020 Chemotherapy   Patient is on Treatment Plan : PANCREAS Modified FOLFIRINOX q14d x 4 cycles      HISTORY OF PRESENTING ILLNESS:  Robin SAVOIA89y.[id number].  female borderline resectable pancreatic adeno ca currently s/p neoadjuvant chemotherapy with FOLFIRINOX x10 cycles-status post Whipple's on July 15, 2020  is here for follow-up-review of the CT scan.  Patient continues to have tingling and numbness in the extremities.  Denies any worsening. She continues to take Cymbalta. No falls.    Denies any worsening abdominal pain.  No nausea no vomiting.  No fever no chills.   Review of Systems  Constitutional:  Negative for chills, diaphoresis, fever and malaise/fatigue.  HENT:  Negative for nosebleeds and sore throat.   Eyes:  Negative for double vision.  Respiratory:  Negative for cough, hemoptysis, sputum production, shortness of breath and wheezing.   Cardiovascular:  Negative for chest pain, palpitations, orthopnea and leg swelling.  Gastrointestinal:  Negative for abdominal pain, blood in stool, constipation, diarrhea, heartburn, melena, nausea and vomiting.  Genitourinary:  Negative  for dysuria, frequency and urgency.   Musculoskeletal:  Negative for back pain and joint pain.  Skin: Negative.  Negative for itching and rash.  Neurological:  Positive for tingling. Negative for dizziness, focal weakness, weakness and headaches.  Endo/Heme/Allergies:  Does not bruise/bleed easily.  Psychiatric/Behavioral:  Negative for depression. The patient is not nervous/anxious and does not have insomnia.      MEDICAL HISTORY:  Past Medical History:  Diagnosis Date   Anemia    history of   Anxiety 09/11/2014   Arthritis    Cancer of head of pancreas ([place]) 79/39/0300   Complication of anesthesia    Fluttering heart    Hyperlipidemia    Hypertension    PONV (postoperative nausea and vomiting)    Pre-diabetes    Vertigo    none recently    SURGICAL HISTORY: Past Surgical History:  Procedure Laterality Date   CATARACT EXTRACTION W/PHACO Right 06/09/2021   Procedure: CATARACT EXTRACTION PHACO AND INTRAOCULAR LENS PLACEMENT ([place]) RIGHT malyugin;  Surgeon: Birder Robson, MD;  Location: [place];  Service: Ophthalmology;  Laterality: Right;  12.40 1:07.0   COLONOSCOPY WITH PROPOFOL N/A 01/13/2021   Procedure: COLONOSCOPY WITH PROPOFOL;  Surgeon: Jonathon Bellows, MD;  Location[facility] ENDOSCOPY;  Service: Gastroenterology;  Laterality: N/A;   ENDOSCOPIC RETROGRADE CHOLANGIOPANCREATOGRAPHY (ERCP) WITH PROPOFOL N/A 11/16/2019   Procedure: ENDOSCOPIC RETROGRADE CHOLANGIOPANCREATOGRAPHY (ERCP) WITH PROPOFOL;  Surgeon: Lucilla Lame, MD;  Location: [facility] ENDOSCOPY;  Service: Endoscopy;  Laterality: N/A;   ERCP N/A 03/27/2020   Procedure: ENDOSCOPIC RETROGRADE CHOLANGIOPANCREATOGRAPHY (ERCP);  Surgeon: Lucilla Lame, MD;  Location[facility] ENDOSCOPY;  Service: Endoscopy;  Laterality: N/A;   EUS N/A 11/29/2019   Procedure: FULL UPPER ENDOSCOPIC ULTRASOUND (EUS) RADIAL;  Surgeon: Reita Cliche, MD;  Location: [facility] ENDOSCOPY;  Service: Gastroenterology;  Laterality: N/A;   IR FLUORO RM 30-60 MIN  07/30/2020   IR IMAGING  GUIDED PORT INSERTION  12/14/2019   JEJUNOSTOMY Left 07/15/2020   Procedure: Shanon Rosser;  Surgeon: Stark Klein, MD;  Location: [place];  Service: General;  Laterality: Left;   LAPAROSCOPY N/A 07/15/2020   Procedure: DIAGNOSTIC LAPAROSCOPY;  Surgeon: Stark Klein, MD;  Location: [place];  Service: General;  Laterality: N/A;   right knee replacement Right [id number]   SHOULDER ARTHROSCOPY W/ ROTATOR CUFF REPAIR Right    WHIPPLE PROCEDURE N/A 07/15/2020   Procedure: WHIPPLE PROCEDURE;  Surgeon: Stark Klein, MD;  Location: [place];  Service: General;  Laterality: N/A;    SOCIAL HISTORY: Social History   Socioeconomic History   Marital status: Divorced    Spouse name: Not on file   Number of children: Not on file   Years of education: Not on file   Highest education level: Not on file  Occupational History   Not on file  Tobacco Use   Smoking status: Never   Smokeless tobacco: Never  Vaping Use   Vaping Use: Never used  Substance and Sexual Activity   Alcohol use: No    Alcohol/week: 0.0 standard drinks of alcohol   Drug use: No   Sexual activity: Never  Other Topics Concern   Not on file  Social History Narrative   ** Merged History Encounter **       Lives in [place]; with mom and son; worked in dietary; never smoked; no alcohol.    Social Determinants of Health   Financial Resource Strain: Not on file  Food Insecurity: Not on file  Transportation Needs: Not on file  Physical Activity: Not on  file  Stress: Not on file  Social Connections: Not on file  Intimate Partner Violence: Not on file    FAMILY HISTORY: Family History  Problem Relation Age of Onset   Diabetes Mother    Hyperlipidemia Mother    Hypertension Mother    Vision loss Mother    Arthritis Father    Hyperlipidemia Father    Hypertension Father    Breast cancer Maternal Aunt     ALLERGIES:  has No Known Allergies.  MEDICATIONS:  Current Outpatient Medications  Medication Sig Dispense Refill    acetaminophen (TYLENOL) 325 MG tablet Take 650 mg by mouth every 6 (six) hours as needed for moderate pain.     amLODipine (NORVASC) 10 MG tablet Take 1 tablet (10 mg total) by mouth daily. 30 tablet 0   Calcium Carbonate-Vit D-Min (CALCIUM 600+D PLUS MINERALS PO) Take 1 tablet by mouth in the morning and at bedtime.     DULoxetine (CYMBALTA) 20 MG capsule Take 1 capsule by mouth once daily (Patient not taking: Reported on 11/23/2021) 30 capsule 0   gabapentin (NEURONTIN) 300 MG capsule Take 300 mg by mouth daily.     lisinopril (ZESTRIL) 10 MG tablet Take 10 mg by mouth daily.     metoprolol succinate (TOPROL-XL) 50 MG 24 hr tablet Take 1 tablet (50 mg total) by mouth daily. Take with or immediately following a meal. 30 tablet 0   Potassium Chloride ER 20 MEQ TBCR Take 20 mEq by mouth in the morning and at bedtime. 60 tablet 1   vitamin B-12 (CYANOCOBALAMIN) 1000 MCG tablet Take 1 tablet (1,000 mcg total) by mouth daily.     No current facility-administered medications for this visit.   Facility-Administered Medications Ordered in Other Visits  Medication Dose Route Frequency Provider Last Rate Last Admin   sodium chloride flush (NS) 0.9 % injection 10 mL  10 mL Intravenous PRN Cammie Sickle, MD   10 mL at 06/23/21 1012      .  PHYSICAL EXAMINATION: ECOG PERFORMANCE STATUS: 1 - Symptomatic but completely ambulatory  Vitals:   03/26/22 1129  BP: (!) 160/84  Pulse: 97  Resp: 20  Temp: 98.6 F (37 C)  SpO2: 100%    Filed Weights   03/26/22 1129  Weight: 200 lb 11.2 oz (91 kg)     Physical Exam HENT:     Head: Normocephalic and atraumatic.     Mouth/Throat:     Pharynx: No oropharyngeal exudate.  Eyes:     Pupils: Pupils are equal, round, and reactive to light.  Cardiovascular:     Rate and Rhythm: Normal rate and regular rhythm.  Pulmonary:     Effort: Pulmonary effort is normal. No respiratory distress.     Breath sounds: Normal breath sounds. No wheezing.   Abdominal:     General: Bowel sounds are normal. There is no distension.     Palpations: Abdomen is soft. There is no mass.     Tenderness: There is no abdominal tenderness. There is no guarding or rebound.  Musculoskeletal:        General: No tenderness. Normal range of motion.     Cervical back: Normal range of motion and neck supple.  Skin:    General: Skin is warm.  Neurological:     Mental Status: She is alert and oriented to person, place, and time.  Psychiatric:        Mood and Affect: Affect normal.      LABORATORY  DATA:  I have reviewed the data as listed Lab Results  Component Value Date   WBC 4.9 03/26/2022   HGB 10.0 (L) 03/26/2022   HCT 30.5 (L) 03/26/2022   MCV 73.8 (L) 03/26/2022   PLT 309 03/26/2022   Recent Labs    11/23/21 1007 01/22/22 1032 03/18/22 0949 03/26/22 1112  NA 137 139  --  137  K 3.2* 3.8  --  3.5  CL 104 107  --  104  CO2 25 24  --  24  GLUCOSE 108* 135*  --  130*  BUN 16 14  --  15  CREATININE 0.78 0.83 0.80 0.92  CALCIUM 8.6* 8.6*  --  8.7*  GFRNONAA >60 >60  --  >60  PROT 8.0 7.4  --  7.6  ALBUMIN 3.8 3.5  --  3.7  AST 21 24  --  21  ALT 15 15  --  12  ALKPHOS 120 105  --  100  BILITOT 0.4 0.4  --  0.4    RADIOGRAPHIC STUDIES: I have personally reviewed the radiological images as listed and agreed with the findings in the report. CT Abdomen Pelvis W Contrast  Result Date: 03/22/2022 CLINICAL DATA:  Pancreatic cancer diagnosed 2 years ago with Whipple procedure. Chemotherapy completed last January. Restaging. * Tracking Code: BO * EXAM: CT ABDOMEN AND PELVIS WITH CONTRAST TECHNIQUE: Multidetector CT imaging of the abdomen and pelvis was performed using the standard protocol following bolus administration of intravenous contrast. RADIATION DOSE REDUCTION: This exam was performed according to the departmental dose-optimization program which includes automated exposure control, adjustment of the mA and/or kV according to patient  size and/or use of iterative reconstruction technique. CONTRAST:  153m OMNIPAQUE IOHEXOL 300 MG/ML  SOLN COMPARISON:  Abdominopelvic CT 11/16/2021 and 07/21/2021. FINDINGS: Lower chest: Pleural based lipoma posteriorly in the right hemithorax is incompletely visualized, although grossly stable. Mild coronary artery atherosclerosis. No significant pleural or pericardial effusion. There are tiny nodules at both lung bases measuring 5 mm in the right lower lobe on image 13/4 and 2-3 mm in the left lower lobe on image 17/4, not clearly seen previously. Hepatobiliary: The liver is normal in density without suspicious focal abnormality. Pneumobilia post cholecystectomy and Whipple procedure. Pancreas: Stable postsurgical appearance of the pancreatic body and neck status post Whipple procedure. Low-density structure near the anastomosis measuring 1.3 x 0.7 cm on image 27/2 is unchanged and may reflect a small bowel diverticulum. Pancreatic stent remains in place. No evidence of pancreatic ductal dilatation, recurrent mass lesion or surrounding inflammation. Spleen: Normal in size without focal abnormality. Adrenals/Urinary Tract: Both adrenal glands appear normal. No evidence of urinary tract calculus, suspicious renal lesion or hydronephrosis. The bladder appears unremarkable for its degree of distention. Stomach/Bowel: No enteric contrast administered. There are postsurgical changes from previous gastrojejunostomy and Whipple procedure. Loops of small bowel in the right upper quadrant are not opacified with contrast and appears somewhat matted, although similar to the previous study. Persistent extension of small bowel into a periumbilical hernia. No evidence of incarceration or obstruction. The appendix and colon appear normal aside from mild distal colonic diverticulosis. Vascular/Lymphatic: There are no enlarged abdominal or pelvic lymph nodes. Mild aortic and branch vessel atherosclerosis without evidence of  aneurysm. The portal, superior mesenteric and splenic veins are patent. Reproductive: The uterus and ovaries appear unremarkable. No evidence of adnexal mass. Other: No ascites or peritoneal nodularity. Periumbilical hernia containing small bowel. Musculoskeletal: No acute or significant osseous findings. Stable  mild spondylosis. IMPRESSION: 1. Stable postoperative appearance status post Whipple procedure. No evidence of local recurrence or metastatic disease in the abdomen or pelvis. 2. Tiny nodules at both lung bases, not clearly seen previously. These are nonspecific, although given the patient's history, consider full chest CT in 3-6 months. 3. Stable periumbilical hernia containing small bowel. No evidence of incarceration or obstruction. 4.  Aortic Atherosclerosis (ICD10-I70.0). Electronically Signed   By: Richardean Sale M.D.   On: 03/22/2022 14:12    ASSESSMENT & PLAN:   Cancer of head of pancreas ([place]) #  Pancreas adenocarcinoma-Stage IB-/boderline resectable [Abutment of the SMV s/p neoadjuvant FOLFIRINX. #10 cycles]-s/p Whipple's [on April 19th 2022]- ypT2 [3.5cm]; pN0/11-CT AP- DEC 25th, 2023- Stable postoperative appearance status post Whipple procedure. No evidence of local recurrence or metastatic disease in the abdomen or pelvis.Tiny nodules at both lung bases, not clearly seen previously.  Recommended CT scan of the chest in 3 months.   # Anemia- microcytic- Stable 9-10; add iron studies/ferritin today;  colo [EUS- 2021; oct 2022- colo; Dr.Anna];    # PN--1-2- on cymbalta- STABLE.   # Elevated Alk Phos- improved from 212- in APRIL 2023- CT NEG. Monitor for now. STABLE.   # Hypertension-poorly controlled 201/91]; Continue Norvasc/Metoprlol.[ per pt At home- does not check] ;AGAIN RE-ITERATED to bring log.   # mediport:  continue port flushes q 56M. discussed re: pro and cons of keeping the port vs. Explantation.   We will keep it for now.- STABLE  # #Incidental findings on CT Imaging  dated: DEC [id number]; umbilical hernia I reviewed/discussed/counseled the patient.   # DISPOSITION:  # port flush in 2 months # Follow up in 4 months- MD; labs; cbc/cmp/ca-19-9; port flush; CT AP prior-;Dr.B  # I reviewed the blood work- with the patient in detail; also reviewed the imaging independently [as summarized above]; and with the patient in detail.       All questions were answered. The patient knows to call the clinic with any problems, questions or concerns.    Cammie Sickle, MD 03/26/2022 11:45 AM

## 2022-03-26 NOTE — Progress Notes (Signed)
Patient has no concerns today. 

## 2022-03-27 LAB — CANCER ANTIGEN 19-9: CA 19-9: 21 U/mL (ref 0–35)

## 2022-04-14 ENCOUNTER — Encounter: Payer: Self-pay | Admitting: Internal Medicine

## 2022-04-23 ENCOUNTER — Ambulatory Visit
Admission: RE | Admit: 2022-04-23 | Discharge: 2022-04-23 | Disposition: A | Discharge status: Home / Self Care | Payer: 59 | Source: Ambulatory Visit | Attending: Internal Medicine | Admitting: Internal Medicine

## 2022-04-23 DIAGNOSIS — C250 Malignant neoplasm of head of pancreas: Secondary | ICD-10-CM | POA: Insufficient documentation

## 2022-04-23 DIAGNOSIS — R918 Other nonspecific abnormal finding of lung field: Secondary | ICD-10-CM | POA: Diagnosis present

## 2022-04-23 MED ORDER — IOHEXOL 300 MG/ML  SOLN
75.0000 mL | Freq: Once | INTRAMUSCULAR | Status: AC | PRN
  Administered 2022-04-23: 75 mL via INTRAVENOUS

## 2022-05-07 ENCOUNTER — Inpatient Hospital Stay: Payer: 59 | Attending: Internal Medicine

## 2022-05-07 DIAGNOSIS — Z452 Encounter for adjustment and management of vascular access device: Secondary | ICD-10-CM | POA: Insufficient documentation

## 2022-05-07 DIAGNOSIS — Z8507 Personal history of malignant neoplasm of pancreas: Secondary | ICD-10-CM | POA: Insufficient documentation

## 2022-05-07 DIAGNOSIS — C250 Malignant neoplasm of head of pancreas: Secondary | ICD-10-CM

## 2022-05-07 MED ORDER — HEPARIN SOD (PORK) LOCK FLUSH 100 UNIT/ML IV SOLN
500.0000 [IU] | Freq: Once | INTRAVENOUS | Status: AC
  Administered 2022-05-07: 500 [IU] via INTRAVENOUS
  Filled 2022-05-07: qty 5

## 2022-06-25 ENCOUNTER — Inpatient Hospital Stay: Payer: 59

## 2022-06-25 ENCOUNTER — Other Ambulatory Visit: Payer: Medicare Other

## 2022-06-25 ENCOUNTER — Encounter: Payer: Self-pay | Admitting: Nurse Practitioner

## 2022-06-25 ENCOUNTER — Inpatient Hospital Stay: Payer: 59 | Attending: Internal Medicine | Admitting: Nurse Practitioner

## 2022-06-25 VITALS — BP 173/82 | HR 77 | Temp 97.7°F | Wt 195.0 lb

## 2022-06-25 DIAGNOSIS — Z452 Encounter for adjustment and management of vascular access device: Secondary | ICD-10-CM | POA: Diagnosis not present

## 2022-06-25 DIAGNOSIS — Z95828 Presence of other vascular implants and grafts: Secondary | ICD-10-CM

## 2022-06-25 DIAGNOSIS — C250 Malignant neoplasm of head of pancreas: Secondary | ICD-10-CM

## 2022-06-25 DIAGNOSIS — Z8507 Personal history of malignant neoplasm of pancreas: Secondary | ICD-10-CM | POA: Diagnosis present

## 2022-06-25 DIAGNOSIS — R918 Other nonspecific abnormal finding of lung field: Secondary | ICD-10-CM

## 2022-06-25 DIAGNOSIS — Z08 Encounter for follow-up examination after completed treatment for malignant neoplasm: Secondary | ICD-10-CM | POA: Diagnosis not present

## 2022-06-25 LAB — COMPREHENSIVE METABOLIC PANEL
ALT: 12 U/L (ref 0–44)
AST: 20 U/L (ref 15–41)
Albumin: 3.6 g/dL (ref 3.5–5.0)
Alkaline Phosphatase: 95 U/L (ref 38–126)
Anion gap: 10 (ref 5–15)
BUN: 10 mg/dL (ref 8–23)
CO2: 26 mmol/L (ref 22–32)
Calcium: 8.9 mg/dL (ref 8.9–10.3)
Chloride: 102 mmol/L (ref 98–111)
Creatinine, Ser: 0.73 mg/dL (ref 0.44–1.00)
GFR, Estimated: >60 mL/min (ref >60)
Glucose, Bld: 110 mg/dL — ABNORMAL HIGH (ref 70–99)
Potassium: 3 mmol/L — ABNORMAL LOW (ref 3.5–5.1)
Sodium: 138 mmol/L (ref 135–145)
Total Bilirubin: 0.6 mg/dL (ref 0.3–1.2)
Total Protein: 7.8 g/dL (ref 6.5–8.1)

## 2022-06-25 LAB — CBC WITH DIFFERENTIAL/PLATELET
Abs Immature Granulocytes: 0.01 10*3/uL (ref 0.00–0.07)
Basophils Absolute: 0 10*3/uL (ref 0.0–0.1)
Basophils Relative: 0 %
Eosinophils Absolute: 0.1 10*3/uL (ref 0.0–0.5)
Eosinophils Relative: 2 %
HCT: 31.2 % — ABNORMAL LOW (ref 36.0–46.0)
Hemoglobin: 9.8 g/dL — ABNORMAL LOW (ref 12.0–15.0)
Immature Granulocytes: 0 %
Lymphocytes Relative: 42 %
Lymphs Abs: 2.1 10*3/uL (ref 0.7–4.0)
MCH: 23.3 pg — ABNORMAL LOW (ref 26.0–34.0)
MCHC: 31.4 g/dL (ref 30.0–36.0)
MCV: 74.3 fL — ABNORMAL LOW (ref 80.0–100.0)
Monocytes Absolute: 0.4 10*3/uL (ref 0.1–1.0)
Monocytes Relative: 8 %
Neutro Abs: 2.4 10*3/uL (ref 1.7–7.7)
Neutrophils Relative %: 48 %
Platelets: 329 10*3/uL (ref 150–400)
RBC: 4.2 MIL/uL (ref 3.87–5.11)
RDW: 15.9 % — ABNORMAL HIGH (ref 11.5–15.5)
WBC: 5 10*3/uL (ref 4.0–10.5)
nRBC: 0 % (ref 0.0–0.2)

## 2022-06-25 MED ORDER — SODIUM CHLORIDE 0.9% FLUSH
10.0000 mL | Freq: Once | INTRAVENOUS | Status: AC
  Administered 2022-06-25: 10 mL via INTRAVENOUS
  Filled 2022-06-25: qty 10

## 2022-06-25 MED ORDER — HEPARIN SOD (PORK) LOCK FLUSH 100 UNIT/ML IV SOLN
500.0000 [IU] | Freq: Once | INTRAVENOUS | Status: AC
  Administered 2022-06-25: 500 [IU] via INTRAVENOUS
  Filled 2022-06-25: qty 5

## 2022-06-25 NOTE — Progress Notes (Signed)
[facility] CONSULT NOTE  Patient Care Team: Entzminger, Danton Clap, MD as PCP - General (Internal Medicine) Toy Cookey, FNP (Family Medicine) Jim Like, RN as Registered Nurse Scarlett Presto, RN (Inactive) as Registered Nurse Benita Gutter, RN as Oncology Nurse Navigator Almond Lint, MD as Consulting Physician (General Surgery) Earna Coder, MD as Consulting Physician (Internal Medicine)  CHIEF COMPLAINTS/PURPOSE OF CONSULTATION: pancreas adenocarcinoma  Oncology History Overview Note  #Pancreas adenocarcinoma-Stage IB- [id number]-[EUS- Dr.Spaete; [facility]; mass 22x60mm; invading/abutting superior mesenteric vein; 2 enlarged lymph nodes peripancreatic/porta hepatis largest 10 x 5.8 mm-nonpathologic based on EUS criteria/no biopsy]-borderline resectable.  PET scan no evidence of distant metastatic disease.  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 Fredirick Maudlin September]  # 01/02/2020-FOLFIRINOX; Neulasta. S/p FOLFIRINOX 10 cycles- April 19th whipples-  s/p neoadjuvant FOLFIRINX. #10 cycles]-s/p Whipple's [on April 19th 2022]- ypT2 [3.5cm]; pN0/11;   DIS-CONTINUED  FOLFIRINOX cycle #11 & 12-  Sec to PN-2-3.  PANCREAS (EXOCRINE), CARCINOMA: Resection  Procedure: Whipple procedure  Tumor Site: Head of pancreas.  Tumor Size: 3.5 cm, slide measurement.  Histologic Type: Pancreatic ductal adenocarcinoma.  Histologic Grade: Moderately differentiated.  Tumor Extension: Into peripancreatic connective tissue.  Treatment Effect: Moderate treatment effect.  Lymphovascular Invasion: Not identified.  Perineural Invasion: Present.  Margins: All surgical margins are negative for carcinoma.  Regional Lymph Nodes:       Number of Lymph Nodes with Tumor: 0       Number of Lymph Nodes Examined: 15  Distant Metastasis:       Distant  Site(s) Involved: Not applicable.  Pathologic Stage Classification (pTNM, AJCC 8th Edition): ypT2, ypN0   # Borderline DM; HTN   # SURVIVORSHIP:   # GENETICS:   DIAGNOSIS: Pancreatic cancer  STAGE:  IB/borderline resectable;  GOALS: cure    Cancer of head of pancreas (HCC)  12/04/2019 Initial Diagnosis   Cancer of head of pancreas (HCC)   12/19/2019 - 12/19/2019 Chemotherapy   The patient had PACLitaxel-protein bound (ABRAXANE) chemo infusion 250 mg, 125 mg/m2 = 250 mg, Intravenous,  Once, 1 of 4 cycles Administration: 250 mg (12/19/2019) gemcitabine (GEMZAR) 2,000 mg in sodium chloride 0.9 % 250 mL chemo infusion, 1,976 mg, Intravenous,  Once, 1 of 4 cycles Administration: 2,000 mg (12/19/2019)  for chemotherapy treatment.    01/02/2020 - 05/15/2020 Chemotherapy   Patient is on Treatment Plan : PANCREAS Modified FOLFIRINOX q14d x 4 cycles      HISTORY OF PRESENTING ILLNESS:  Robin Daniels 67 y.o.  female borderline resectable pancreatic adeno ca currently s/p neoadjuvant chemotherapy with FOLFIRINOX x10 cycles-status post Whipple's on July 15, 2020  is here for follow-up-review of the CT scan. Patient continues to have tingling and numbness in the extremities.  Denies any worsening. She continues to take Cymbalta. No falls.  Denies any worsening abdominal pain.  No nausea no vomiting.  No fever no chills.Weight is stable.   Review of Systems  Constitutional:  Negative for chills, diaphoresis, fever and malaise/fatigue.  HENT:  Negative for nosebleeds and sore throat.   Eyes:  Negative for double vision.  Respiratory:  Negative for cough, hemoptysis, sputum production, shortness of breath and wheezing.   Cardiovascular:  Negative for chest pain, palpitations, orthopnea and leg swelling.  Gastrointestinal:  Negative for abdominal pain, blood in stool, constipation, diarrhea, heartburn, melena, nausea and vomiting.  Genitourinary:  Negative for dysuria,  frequency and urgency.   Musculoskeletal:  Negative for back pain and joint pain.  Skin: Negative.  Negative for itching and rash.  Neurological:  Positive for tingling. Negative for dizziness, focal weakness, weakness and headaches.  Endo/Heme/Allergies:  Does not bruise/bleed easily.  Psychiatric/Behavioral:  Negative for depression. The patient is not nervous/anxious and does not have insomnia.      MEDICAL HISTORY:  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/04/2019   Complication of anesthesia    Fluttering heart    Hyperlipidemia    Hypertension    PONV (postoperative nausea and vomiting)    Pre-diabetes    Vertigo    none recently    SURGICAL HISTORY: Past Surgical History:  Procedure Laterality Date   CATARACT EXTRACTION W/PHACO Right 06/09/2021   Procedure: CATARACT EXTRACTION PHACO AND INTRAOCULAR LENS PLACEMENT (IOC) RIGHT malyugin;  Surgeon: Galen Manila, MD;  Location[facility];  Service: Ophthalmology;  Laterality: Right;  12.40 1:07.0   COLONOSCOPY WITH PROPOFOL N/A 01/13/2021   Procedure: COLONOSCOPY WITH PROPOFOL;  Surgeon: Wyline Mood, MD;  Location[facility] ENDOSCOPY;  Service: Gastroenterology;  Laterality: N/A;   ENDOSCOPIC RETROGRADE CHOLANGIOPANCREATOGRAPHY (ERCP) WITH PROPOFOL N/A 11/16/2019   Procedure: ENDOSCOPIC RETROGRADE CHOLANGIOPANCREATOGRAPHY (ERCP) WITH PROPOFOL;  Surgeon: Midge Minium, MD;  Location: [facility] ENDOSCOPY;  Service: Endoscopy;  Laterality: N/A;   ERCP N/A 03/27/2020   Procedure: ENDOSCOPIC RETROGRADE CHOLANGIOPANCREATOGRAPHY (ERCP);  Surgeon: Midge Minium, MD;  Location: [facility] ENDOSCOPY;  Service: Endoscopy;  Laterality: N/A;   EUS N/A 11/29/2019   Procedure: FULL UPPER ENDOSCOPIC ULTRASOUND (EUS) RADIAL;  Surgeon: Doren Custard, MD;  Location: [facility] ENDOSCOPY;  Service: Gastroenterology;  Laterality: N/A;   IR FLUORO RM 30-60 MIN  07/30/2020   IR IMAGING  GUIDED PORT INSERTION  12/14/2019   JEJUNOSTOMY Left 07/15/2020   Procedure: Rance Muir;  Surgeon: Almond Lint, MD;  Location: [facility] OR;  Service: General;  Laterality: Left;   LAPAROSCOPY N/A 07/15/2020   Procedure: DIAGNOSTIC LAPAROSCOPY;  Surgeon: Almond Lint, MD;  Location: [facility] OR;  Service: General;  Laterality: N/A;   right knee replacement Right [id number]   SHOULDER ARTHROSCOPY W/ ROTATOR CUFF REPAIR Right    WHIPPLE PROCEDURE N/A 07/15/2020   Procedure: WHIPPLE PROCEDURE;  Surgeon: Almond Lint, MD;  Location: [facility] OR;  Service: General;  Laterality: N/A;    SOCIAL HISTORY: Social History   Socioeconomic History   Marital status: Divorced    Spouse name: Not on file   Number of children: Not on file   Years of education: Not on file   Highest education level: Not on file  Occupational History   Not on file  Tobacco Use   Smoking status: Never   Smokeless tobacco: Never  Vaping Use   Vaping Use: Never used  Substance and Sexual Activity   Alcohol use: No    Alcohol/week: 0.0 standard drinks of alcohol   Drug use: No   Sexual activity: Never  Other Topics Concern   Not on file  Social History Narrative   ** Merged History Encounter **       Lives in [place]; with mom and son; worked in dietary; never smoked; no alcohol.    Social Determinants of Health   Financial Resource Strain: Not on file  Food Insecurity: Not on file  Transportation Needs: Not on file  Physical Activity: Not on file  Stress: Not on file  Social Connections: Not on file  Intimate Partner Violence: Not on file    FAMILY HISTORY: Family History  Problem Relation Age of Onset   Diabetes Mother    Hyperlipidemia Mother    Hypertension Mother    Vision loss Mother    Arthritis Father    Hyperlipidemia Father    Hypertension Father    Breast cancer Maternal Aunt     ALLERGIES:  has No Known Allergies.  MEDICATIONS:  Current Outpatient Medications  Medication Sig Dispense Refill    acetaminophen (TYLENOL) 325 MG tablet Take 650 mg by mouth every 6 (six) hours as needed for moderate pain.     amLODipine (NORVASC) 10 MG tablet Take 1 tablet (10 mg total) by mouth daily. 30 tablet 0   Calcium Carbonate-Vit D-Min (CALCIUM 600+D PLUS MINERALS PO) Take 1 tablet by mouth in the morning and at bedtime.     DULoxetine (CYMBALTA) 20 MG capsule Take 1 capsule by mouth once daily (Patient not taking: Reported on 11/23/2021) 30 capsule 0   gabapentin (NEURONTIN) 300 MG capsule Take 300 mg by mouth daily.     lisinopril (ZESTRIL) 10 MG tablet Take 10 mg by mouth daily.     metoprolol succinate (TOPROL-XL) 50 MG 24 hr tablet Take 1 tablet (50 mg total) by mouth daily. Take with or immediately following a meal. 30 tablet 0   Potassium Chloride ER 20 MEQ TBCR Take 20 mEq by mouth in the morning and at bedtime. 60 tablet 1   vitamin B-12 (CYANOCOBALAMIN) 1000 MCG tablet Take 1 tablet (1,000 mcg total) by mouth daily.     No current facility-administered medications for this visit.   Facility-Administered Medications Ordered in Other Visits  Medication Dose Route Frequency Provider Last Rate Last Admin   sodium chloride flush (NS) 0.9 % injection 10 mL  10 mL Intravenous PRN Louretta Shorten R, MD   10 mL at 06/23/21 1012      PHYSICAL EXAMINATION: ECOG PERFORMANCE STATUS: 0 - Asymptomatic  Vitals:   06/25/22 1037  BP: (!) 173/82  Pulse: 77  Temp: 97.7 F (36.5 C)  SpO2: 100%   Filed Weights   06/25/22 1037  Weight: 195 lb (88.5 kg)   Physical Exam Vitals reviewed.  Constitutional:      Appearance: She is not ill-appearing.  HENT:     Head: Normocephalic and atraumatic.     Mouth/Throat:     Pharynx: No oropharyngeal exudate.  Eyes:     General: No scleral icterus. Cardiovascular:     Rate and Rhythm: Normal rate and regular rhythm.  Pulmonary:     Effort: Pulmonary effort is normal. No respiratory distress.     Breath sounds: No wheezing.  Abdominal:      General: There is no distension.     Palpations: Abdomen is soft.     Tenderness: There is no abdominal tenderness. There is no guarding.  Musculoskeletal:        General: No tenderness or deformity.  Lymphadenopathy:     Cervical: No cervical adenopathy.  Skin:    General: Skin is warm.     Coloration: Skin is not pale.  Neurological:     Mental Status: She is alert and oriented to person, place, and time.  Psychiatric:        Mood and Affect: Mood and affect normal.        Behavior: Behavior normal.    LABORATORY DATA:  I have reviewed  the data as listed Lab Results  Component Value Date   WBC 5.0 06/25/2022   HGB 9.8 (L) 06/25/2022   HCT 31.2 (L) 06/25/2022   MCV 74.3 (L) 06/25/2022   PLT 329 06/25/2022   Recent Labs    01/22/22 1032 03/18/22 0949 03/26/22 1112 06/25/22 1025  NA 139  --  137 138  K 3.8  --  3.5 3.0*  CL 107  --  104 102  CO2 24  --  24 26  GLUCOSE 135*  --  130* 110*  BUN 14  --  15 10  CREATININE 0.83 0.80 0.92 0.73  CALCIUM 8.6*  --  8.7* 8.9  GFRNONAA >60  --  >60 >60  PROT 7.4  --  7.6 7.8  ALBUMIN 3.5  --  3.7 3.6  AST 24  --  21 20  ALT 15  --  12 12  ALKPHOS 105  --  100 95  BILITOT 0.4  --  0.4 0.6   Component Ref Range & Units 1 mo ago (06/25/22) 4 mo ago (03/26/22) 6 mo ago (01/22/22) 8 mo ago (11/23/21) 1 yr ago (06/23/21) 1 yr ago (04/20/21) 1 yr ago (01/23/21)  CA 19-9 0 - 35 U/mL 24 21 CM 17 CM 12 CM 14 CM 13 CM 16 CM     RADIOGRAPHIC STUDIES: I have personally reviewed the radiological images as listed and agreed with the findings in the report. No results found.  ASSESSMENT & PLAN:   No problem-specific Assessment & Plan notes found for this encounter.  #  Pancreas adenocarcinoma-Stage IB-/boderline resectable [Abutment of the SMV s/p neoadjuvant FOLFIRINX. #10 cycles]-s/p Whipple's [on April 19th 2022]- ypT2 [3.5cm]; pN0/11-CT AP- DEC 25th, 2023- Stable postoperative appearance status post Whipple procedure.  Clinically, no evidence of recurrence. CA 19-9 pending at time of visit but has slowly up trended over past year. Was 12-13, now 25. Her last CT was December 2023 which was negative for recurrent disease. Plan to get a CT abdomen pelvis next month for surveillance.   # Small lung nodules- repeat imaging 04/23/22 showed resolution of tiny nodules at lung bases. No additional follow up recommended.    # Anemia- microcytic- Stable 9-10. 06/23/21- ferritin was normal but decreased at 27, iron sat normal at 13%. EUS & [place] 2021- 2022 with Dr Tobi Bastos  # PN--1-2- on cymbalta- STABLE.   # Hypokalemia- K 3.0. Encouraged her to keep taking her oral potassium. Refill provided.    # Elevated Alk Phos- improved from 212- in APRIL 2023- CT NEG. Today, normalized to 95. Monitor.    # Hypertension-poorly controlled 201/91. Today, 173/82. Continue Norvasc/Metoprolol. Poorly controlled. Strongly encouraged home monitoring. Follow up with PCP/Cardiology.     # mediport:  continue port flushes q 68M. discussed re: pro and cons of keeping the port vs. Explantation.   We will keep it for now.- STABLE   # Incidental findings on CT Imaging dated: DEC 2023-atherosclerosis & umbilical hernia . She'll discuss with PCP.   # Port a cath: functioning appropriately.    DISPOSITION:  Ct April 2024 2 mo- port flush 3 months- lab (cbc, cmp, ca 19-9), see Dr B- la   All questions were answered. The patient knows to call the clinic with any problems, questions or concerns.   Alinda Dooms, NP 06/25/2022

## 2022-06-27 LAB — CANCER ANTIGEN 19-9: CA 19-9: 24 U/mL (ref 0–35)

## 2022-07-16 IMAGING — CT NM PET TUM IMG INITIAL (PI) SKULL BASE T - THIGH
1 of 9 series · 1 of 25 positions shown · non-contrast
Comparison: MRI 11/14/2019

CLINICAL DATA: Initial treatment strategy for pancreatic mass.

EXAM:
NUCLEAR MEDICINE PET SKULL BASE TO THIGH
TECHNIQUE: 10.96 mCi F-18 FDG was injected intravenously. Full-ring PET imaging
was performed from the skull base to thigh after the radiotracer. CT
data was obtained and used for attenuation correction and anatomic
localization.
Fasting blood glucose: 138 mg/dl

[Series 3: ct wb 5.0 b30f · axial · 5.0mm · 0.98mm/px · 1 of 287 slices shown]
[im 287/287  brain]
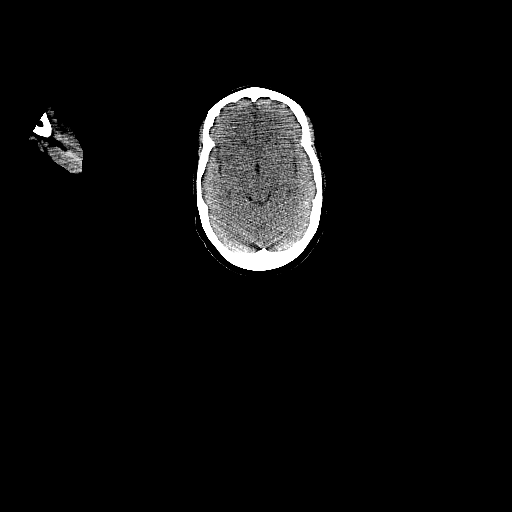

[1 of 25 positions shown; findings below may reference images not displayed]

FINDINGS: Mediastinal blood pool activity: SUV max

Liver activity: SUV max NA

NECK: Mild asymmetric increased radiotracer uptake within the right
tonsillar region is identified when compared with the left. SUV max
in the right tonsillar region is equal to 7.966 versus 5.91 on the
contralateral side. Additionally, there is a right level 2 lymph
node which exhibits an SUV max of 5.01.

Incidental CT findings: none

CHEST: No FDG avid supraclavicular, axillary, mediastinal or hilar
lymph nodes. No FDG avid pulmonary nodule or mass identified. There
is a large fat attenuation mass overlying the posterior and lateral
right lower lobe. No corresponding FDG uptake is identified within
this structure which is favored to represent a benign lipoma.

Incidental CT findings: Aortic atherosclerosis. Coronary artery
calcifications.

ABDOMEN/PELVIS: No focal liver abnormality identified. Pneumobilia
is identified status post common bile duct stent. Mild increased
radiotracer uptake localizes to the head of pancreas surrounding the
common bile duct stent with SUV max of 5.39. No FDG avid foci within
the spleen or adrenal glands. No FDG avid abdominopelvic lymph
nodes. The cystic lesions within head of pancreas are better seen on
recent MRI.

Incidental CT findings: Mild aortic atherosclerosis. Gallstones
identified.

SKELETON: No focal hypermetabolic activity to suggest skeletal
metastasis.

Incidental CT findings: none
IMPRESSION: 1. Mild radiotracer uptake localizes to the head of pancreas in the
area surrounding the common bile duct stent. Cannot rule out
underlying pancreatic neoplasm. Consider further evaluation with
endoscopic ultrasound with tissue sampling if indicated.
2. No signs of FDG avid liver metastasis or upper abdominal nodal
metastasis.
3. There is asymmetric increased uptake within the right tonsillar
region with a mildly FDG avid right level 2 lymph node. Findings may
be related to upper respiratory tract viral infection. Underlying
head neck neoplasm would be difficult to exclude. Correlate for any
clinical signs or symptoms of recent upper respiratory tract viral
infection. If clinically indicated contrast enhanced CT or MRI of
the neck and or direct visualization is advised.

## 2022-07-19 ENCOUNTER — Ambulatory Visit
Admission: RE | Admit: 2022-07-19 | Discharge: 2022-07-19 | Disposition: A | Discharge status: Home / Self Care | Payer: 59 | Source: Ambulatory Visit | Attending: Nurse Practitioner | Admitting: Nurse Practitioner

## 2022-07-19 DIAGNOSIS — Z08 Encounter for follow-up examination after completed treatment for malignant neoplasm: Secondary | ICD-10-CM | POA: Insufficient documentation

## 2022-07-19 DIAGNOSIS — Z8507 Personal history of malignant neoplasm of pancreas: Secondary | ICD-10-CM | POA: Diagnosis present

## 2022-07-19 MED ORDER — IOHEXOL 300 MG/ML  SOLN
100.0000 mL | Freq: Once | INTRAMUSCULAR | Status: AC | PRN
  Administered 2022-07-19: 100 mL via INTRAVENOUS

## 2022-07-23 ENCOUNTER — Telehealth: Payer: Self-pay | Admitting: Nurse Practitioner

## 2022-07-23 DIAGNOSIS — C250 Malignant neoplasm of head of pancreas: Secondary | ICD-10-CM

## 2022-07-23 NOTE — Telephone Encounter (Signed)
Reviewed ct results with patient which did not show evidence of malignancy. She'll follow up with Dr Donneta Romberg as planned.

## 2022-07-29 ENCOUNTER — Encounter: Payer: Self-pay | Admitting: Internal Medicine

## 2022-07-29 MED ORDER — POTASSIUM CHLORIDE ER 20 MEQ PO TBCR: 20.0000 meq | EXTENDED_RELEASE_TABLET | Freq: Every day | ORAL | 1 refills | Status: DC

## 2022-08-03 ENCOUNTER — Other Ambulatory Visit: Payer: Self-pay | Admitting: Internal Medicine

## 2022-08-03 DIAGNOSIS — Z1231 Encounter for screening mammogram for malignant neoplasm of breast: Secondary | ICD-10-CM

## 2022-08-06 ENCOUNTER — Ambulatory Visit
Admission: RE | Admit: 2022-08-06 | Discharge: 2022-08-06 | Disposition: A | Discharge status: Home / Self Care | Payer: 59 | Source: Ambulatory Visit | Attending: Internal Medicine | Admitting: Internal Medicine

## 2022-08-06 DIAGNOSIS — Z1231 Encounter for screening mammogram for malignant neoplasm of breast: Secondary | ICD-10-CM | POA: Diagnosis present

## 2022-09-17 ENCOUNTER — Other Ambulatory Visit: Payer: Self-pay | Admitting: *Deleted

## 2022-09-17 DIAGNOSIS — C250 Malignant neoplasm of head of pancreas: Secondary | ICD-10-CM

## 2022-09-22 ENCOUNTER — Encounter: Payer: Self-pay | Admitting: Internal Medicine

## 2022-09-22 ENCOUNTER — Inpatient Hospital Stay (HOSPITAL_BASED_OUTPATIENT_CLINIC_OR_DEPARTMENT_OTHER): Payer: 59 | Admitting: Internal Medicine

## 2022-09-22 ENCOUNTER — Inpatient Hospital Stay: Payer: 59 | Attending: Internal Medicine

## 2022-09-22 VITALS — BP 165/95 | HR 73 | Temp 93.2°F | Resp 18 | Ht 64.0 in | Wt 190.0 lb

## 2022-09-22 DIAGNOSIS — Z8507 Personal history of malignant neoplasm of pancreas: Secondary | ICD-10-CM | POA: Diagnosis present

## 2022-09-22 DIAGNOSIS — I700 Atherosclerosis of aorta: Secondary | ICD-10-CM | POA: Diagnosis not present

## 2022-09-22 DIAGNOSIS — I10 Essential (primary) hypertension: Secondary | ICD-10-CM | POA: Diagnosis not present

## 2022-09-22 DIAGNOSIS — D509 Iron deficiency anemia, unspecified: Secondary | ICD-10-CM | POA: Insufficient documentation

## 2022-09-22 DIAGNOSIS — E876 Hypokalemia: Secondary | ICD-10-CM | POA: Insufficient documentation

## 2022-09-22 DIAGNOSIS — C250 Malignant neoplasm of head of pancreas: Secondary | ICD-10-CM

## 2022-09-22 DIAGNOSIS — Z452 Encounter for adjustment and management of vascular access device: Secondary | ICD-10-CM | POA: Insufficient documentation

## 2022-09-22 DIAGNOSIS — Z08 Encounter for follow-up examination after completed treatment for malignant neoplasm: Secondary | ICD-10-CM | POA: Insufficient documentation

## 2022-09-22 DIAGNOSIS — G629 Polyneuropathy, unspecified: Secondary | ICD-10-CM | POA: Diagnosis not present

## 2022-09-22 DIAGNOSIS — Z79899 Other long term (current) drug therapy: Secondary | ICD-10-CM | POA: Insufficient documentation

## 2022-09-22 DIAGNOSIS — Z95828 Presence of other vascular implants and grafts: Secondary | ICD-10-CM

## 2022-09-22 LAB — CMP (CANCER CENTER ONLY)
ALT: 10 U/L (ref 0–44)
AST: 15 U/L (ref 15–41)
Albumin: 3.6 g/dL (ref 3.5–5.0)
Alkaline Phosphatase: 78 U/L (ref 38–126)
Anion gap: 8 (ref 5–15)
BUN: 10 mg/dL (ref 8–23)
CO2: 26 mmol/L (ref 22–32)
Calcium: 8.6 mg/dL — ABNORMAL LOW (ref 8.9–10.3)
Chloride: 103 mmol/L (ref 98–111)
Creatinine: 0.79 mg/dL (ref 0.44–1.00)
GFR, Estimated: >60 mL/min (ref >60)
Glucose, Bld: 107 mg/dL — ABNORMAL HIGH (ref 70–99)
Potassium: 3.2 mmol/L — ABNORMAL LOW (ref 3.5–5.1)
Sodium: 137 mmol/L (ref 135–145)
Total Bilirubin: 0.2 mg/dL — ABNORMAL LOW (ref 0.3–1.2)
Total Protein: 7.2 g/dL (ref 6.5–8.1)

## 2022-09-22 LAB — CBC WITH DIFFERENTIAL/PLATELET
Abs Immature Granulocytes: 0.01 10*3/uL (ref 0.00–0.07)
Basophils Absolute: 0 10*3/uL (ref 0.0–0.1)
Basophils Relative: 1 %
Eosinophils Absolute: 0.1 10*3/uL (ref 0.0–0.5)
Eosinophils Relative: 3 %
HCT: 30.3 % — ABNORMAL LOW (ref 36.0–46.0)
Hemoglobin: 9.7 g/dL — ABNORMAL LOW (ref 12.0–15.0)
Immature Granulocytes: 0 %
Lymphocytes Relative: 46 %
Lymphs Abs: 2 10*3/uL (ref 0.7–4.0)
MCH: 23.8 pg — ABNORMAL LOW (ref 26.0–34.0)
MCHC: 32 g/dL (ref 30.0–36.0)
MCV: 74.3 fL — ABNORMAL LOW (ref 80.0–100.0)
Monocytes Absolute: 0.4 10*3/uL (ref 0.1–1.0)
Monocytes Relative: 8 %
Neutro Abs: 1.8 10*3/uL (ref 1.7–7.7)
Neutrophils Relative %: 42 %
Platelets: 280 10*3/uL (ref 150–400)
RBC: 4.08 MIL/uL (ref 3.87–5.11)
RDW: 15.8 % — ABNORMAL HIGH (ref 11.5–15.5)
WBC: 4.3 10*3/uL (ref 4.0–10.5)
nRBC: 0 % (ref 0.0–0.2)

## 2022-09-22 MED ORDER — HEPARIN SOD (PORK) LOCK FLUSH 100 UNIT/ML IV SOLN
500.0000 [IU] | Freq: Once | INTRAVENOUS | Status: AC
  Administered 2022-09-22: 500 [IU] via INTRAVENOUS
  Filled 2022-09-22: qty 5

## 2022-09-22 MED ORDER — SODIUM CHLORIDE 0.9% FLUSH
10.0000 mL | Freq: Once | INTRAVENOUS | Status: AC
  Administered 2022-09-22: 10 mL via INTRAVENOUS
  Filled 2022-09-22: qty 10

## 2022-09-22 NOTE — Progress Notes (Signed)
[facility] CONSULT NOTE  Patient Care Team: Entzminger, Danton Clap, MD as PCP - General (Internal Medicine) Toy Cookey, FNP (Family Medicine) Jim Like, RN as Registered Nurse Scarlett Presto, RN (Inactive) as Registered Nurse Benita Gutter, RN as Oncology Nurse Navigator Almond Lint, MD as Consulting Physician (General Surgery) Earna Coder, MD as Consulting Physician (Internal Medicine)  CHIEF COMPLAINTS/PURPOSE OF CONSULTATION: pancreas adenocarcinoma  Oncology History Overview Note  #Pancreas adenocarcinoma-Stage IB- [id number]-[EUS- Dr.Spaete; [facility]/GI; mass 22x69mm; invading/abutting superior mesenteric vein; 2 enlarged lymph nodes peripancreatic/porta hepatis largest 10 x 5.8 mm-nonpathologic based on EUS criteria/no biopsy]-borderline resectable.  PET scan no evidence of distant metastatic disease.  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 Fredirick Maudlin September]  # 01/02/2020-FOLFIRINOX; Neulasta. S/p FOLFIRINOX 10 cycles- April 19th whipples-  s/p neoadjuvant FOLFIRINX. #10 cycles]-s/p Whipple's [on April 19th 2022]- ypT2 [3.5cm]; pN0/11;   DIS-CONTINUED  FOLFIRINOX cycle #11 & 12-  Sec to PN-2-3.  PANCREAS (EXOCRINE), CARCINOMA: Resection  Procedure: Whipple procedure  Tumor Site: Head of pancreas.  Tumor Size: 3.5 cm, slide measurement.  Histologic Type: Pancreatic ductal adenocarcinoma.  Histologic Grade: Moderately differentiated.  Tumor Extension: Into peripancreatic connective tissue.  Treatment Effect: Moderate treatment effect.  Lymphovascular Invasion: Not identified.  Perineural Invasion: Present.  Margins: All surgical margins are negative for carcinoma.  Regional Lymph Nodes:       Number of Lymph Nodes with Tumor: 0       Number of Lymph Nodes Examined: 15  Distant Metastasis:       Distant  Site(s) Involved: Not applicable.  Pathologic Stage Classification (pTNM, AJCC 8th Edition): ypT2, ypN0   # Borderline DM; HTN   # SURVIVORSHIP:   # GENETICS:   DIAGNOSIS: Pancreatic cancer  STAGE:  IB/borderline resectable;  GOALS: cure    Cancer of head of pancreas (HCC)  12/04/2019 Initial Diagnosis   Cancer of head of pancreas (HCC)   12/19/2019 - 12/19/2019 Chemotherapy   The patient had PACLitaxel-protein bound (ABRAXANE) chemo infusion 250 mg, 125 mg/m2 = 250 mg, Intravenous,  Once, 1 of 4 cycles Administration: 250 mg (12/19/2019) gemcitabine (GEMZAR) 2,000 mg in sodium chloride 0.9 % 250 mL chemo infusion, 1,976 mg, Intravenous,  Once, 1 of 4 cycles Administration: 2,000 mg (12/19/2019)  for chemotherapy treatment.    01/02/2020 - 05/15/2020 Chemotherapy   Patient is on Treatment Plan : PANCREAS Modified FOLFIRINOX q14d x 4 cycles      HISTORY OF PRESENTING ILLNESS: Patient ambulating-independently.  Alone.  Robin Daniels 67 y.o.  female borderline resectable pancreatic adeno ca currently s/p neoadjuvant chemotherapy with FOLFIRINOX x10 cycles-status post Whipple's on July 15, 2020  is here for follow-up-review of the CT scan.   Patient continues to have tingling and numbness in the extremities.  Notes worsening of the neuropathy.  Currently on Cymbalta; also received single dose of gabapentin.No falls.     Denies any worsening abdominal pain.  No nausea no vomiting.  No fever no chills.Weight is stable.   Review of Systems  Constitutional:  Negative for chills, diaphoresis, fever and malaise/fatigue.  HENT:  Negative for nosebleeds and sore throat.   Eyes:  Negative for double vision.  Respiratory:  Negative for cough, hemoptysis, sputum production, shortness of breath and wheezing.   Cardiovascular:  Negative for chest pain, palpitations, orthopnea and leg swelling.  Gastrointestinal:  Negative for abdominal pain, blood  in stool, constipation, diarrhea, heartburn,  melena, nausea and vomiting.  Genitourinary:  Negative for dysuria, frequency and urgency.  Musculoskeletal:  Negative for back pain and joint pain.  Skin: Negative.  Negative for itching and rash.  Neurological:  Positive for tingling. Negative for dizziness, focal weakness, weakness and headaches.  Endo/Heme/Allergies:  Does not bruise/bleed easily.  Psychiatric/Behavioral:  Negative for depression. The patient is not nervous/anxious and does not have insomnia.      MEDICAL HISTORY:  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/04/2019   Complication of anesthesia    Fluttering heart    Hyperlipidemia    Hypertension    PONV (postoperative nausea and vomiting)    Pre-diabetes    Vertigo    none recently    SURGICAL HISTORY: Past Surgical History:  Procedure Laterality Date   CATARACT EXTRACTION W/PHACO Right 06/09/2021   Procedure: CATARACT EXTRACTION PHACO AND INTRAOCULAR LENS PLACEMENT (IOC) RIGHT malyugin;  Surgeon: Galen Manila, MD;  Location: [facility];  Service: Ophthalmology;  Laterality: Right;  12.40 1:07.0   COLONOSCOPY WITH PROPOFOL N/A 01/13/2021   Procedure: COLONOSCOPY WITH PROPOFOL;  Surgeon: Wyline Mood, MD;  Location: [facility];  Service: Gastroenterology;  Laterality: N/A;   ENDOSCOPIC RETROGRADE CHOLANGIOPANCREATOGRAPHY (ERCP) WITH PROPOFOL N/A 11/16/2019   Procedure: ENDOSCOPIC RETROGRADE CHOLANGIOPANCREATOGRAPHY (ERCP) WITH PROPOFOL;  Surgeon: Midge Minium, MD;  Location: [facility] ENDOSCOPY;  Service: Endoscopy;  Laterality: N/A;   ERCP N/A 03/27/2020   Procedure: ENDOSCOPIC RETROGRADE CHOLANGIOPANCREATOGRAPHY (ERCP);  Surgeon: Midge Minium, MD;  Location: [facility] ENDOSCOPY;  Service: Endoscopy;  Laterality: N/A;   EUS N/A 11/29/2019   Procedure: FULL UPPER ENDOSCOPIC ULTRASOUND (EUS) RADIAL;  Surgeon: Doren Custard, MD;  Location: [facility] ENDOSCOPY;   Service: Gastroenterology;  Laterality: N/A;   IR FLUORO RM 30-60 MIN  07/30/2020   IR IMAGING GUIDED PORT INSERTION  12/14/2019   JEJUNOSTOMY Left 07/15/2020   Procedure: Rance Muir;  Surgeon: Almond Lint, MD;  Location: [facility] OR;  Service: General;  Laterality: Left;   LAPAROSCOPY N/A 07/15/2020   Procedure: DIAGNOSTIC LAPAROSCOPY;  Surgeon: Almond Lint, MD;  Location: [facility] OR;  Service: General;  Laterality: N/A;   right knee replacement Right [id number]   SHOULDER ARTHROSCOPY W/ ROTATOR CUFF REPAIR Right    WHIPPLE PROCEDURE N/A 07/15/2020   Procedure: WHIPPLE PROCEDURE;  Surgeon: Almond Lint, MD;  Location: [facility] OR;  Service: General;  Laterality: N/A;    SOCIAL HISTORY: Social History   Socioeconomic History   Marital status: Divorced    Spouse name: Not on file   Number of children: Not on file   Years of education: Not on file   Highest education level: Not on file  Occupational History   Not on file  Tobacco Use   Smoking status: Never   Smokeless tobacco: Never  Vaping Use   Vaping Use: Never used  Substance and Sexual Activity   Alcohol use: No    Alcohol/week: 0.0 standard drinks of alcohol   Drug use: No   Sexual activity: Never  Other Topics Concern   Not on file  Social History Narrative   ** Merged History Encounter **       Lives in [place]; with mom and son; worked in dietary; never smoked; no alcohol.    Social Determinants of Health   Financial Resource Strain: Not on file  Food Insecurity: Not  on file  Transportation Needs: Not on file  Physical Activity: Not on file  Stress: Not on file  Social Connections: Not on file  Intimate Partner Violence: Not on file    FAMILY HISTORY: Family History  Problem Relation Age of Onset   Diabetes Mother    Hyperlipidemia Mother    Hypertension Mother    Vision loss Mother    Arthritis Father    Hyperlipidemia Father    Hypertension Father    Breast cancer Maternal Aunt     ALLERGIES:  has No Known  Allergies.  MEDICATIONS:  Current Outpatient Medications  Medication Sig Dispense Refill   acetaminophen (TYLENOL) 325 MG tablet Take 650 mg by mouth every 6 (six) hours as needed for moderate pain.     amLODipine (NORVASC) 10 MG tablet Take 1 tablet (10 mg total) by mouth daily. 30 tablet 0   Calcium Carbonate-Vit D-Min (CALCIUM 600+D PLUS MINERALS PO) Take 1 tablet by mouth in the morning and at bedtime.     DULoxetine (CYMBALTA) 20 MG capsule Take 1 capsule by mouth once daily (Patient not taking: Reported on 11/23/2021) 30 capsule 0   gabapentin (NEURONTIN) 300 MG capsule Take 300 mg by mouth daily.     lisinopril (ZESTRIL) 10 MG tablet Take 10 mg by mouth daily.     metoprolol succinate (TOPROL-XL) 50 MG 24 hr tablet Take 1 tablet (50 mg total) by mouth daily. Take with or immediately following a meal. 30 tablet 0   Potassium Chloride ER 20 MEQ TBCR Take 1 tablet (20 mEq total) by mouth daily. 60 tablet 1   vitamin B-12 (CYANOCOBALAMIN) 1000 MCG tablet Take 1 tablet (1,000 mcg total) by mouth daily.     No current facility-administered medications for this visit.   Facility-Administered Medications Ordered in Other Visits  Medication Dose Route Frequency Provider Last Rate Last Admin   sodium chloride flush (NS) 0.9 % injection 10 mL  10 mL Intravenous PRN Louretta Shorten R, MD   10 mL at 06/23/21 1012      PHYSICAL EXAMINATION: ECOG PERFORMANCE STATUS: 0 - Asymptomatic  Vitals:   09/22/22 1029 09/22/22 1035  BP: (!) 167/84 (!) 165/95  Pulse: 78 73  Resp: 18   Temp: (!) 93.2 F (34 C)   SpO2: 100%    Filed Weights   09/22/22 1029  Weight: 190 lb (86.2 kg)   Physical Exam Vitals reviewed.  Constitutional:      Appearance: She is not ill-appearing.  HENT:     Head: Normocephalic and atraumatic.     Mouth/Throat:     Pharynx: No oropharyngeal exudate.  Eyes:     General: No scleral icterus. Cardiovascular:     Rate and Rhythm: Normal rate and regular rhythm.   Pulmonary:     Effort: Pulmonary effort is normal. No respiratory distress.     Breath sounds: No wheezing.  Abdominal:     General: There is no distension.     Palpations: Abdomen is soft.     Tenderness: There is no abdominal tenderness. There is no guarding.  Musculoskeletal:        General: No tenderness or deformity.  Lymphadenopathy:     Cervical: No cervical adenopathy.  Skin:    General: Skin is warm.     Coloration: Skin is not pale.  Neurological:     Mental Status: She is alert and oriented to person, place, and time.  Psychiatric:        Mood  and Affect: Mood and affect normal.        Behavior: Behavior normal.    LABORATORY DATA:  I have reviewed the data as listed Lab Results  Component Value Date   WBC 4.3 09/22/2022   HGB 9.7 (L) 09/22/2022   HCT 30.3 (L) 09/22/2022   MCV 74.3 (L) 09/22/2022   PLT 280 09/22/2022   Recent Labs    03/26/22 1112 06/25/22 1025 09/22/22 1016  NA 137 138 137  K 3.5 3.0* 3.2*  CL 104 102 103  CO2 24 26 26   GLUCOSE 130* 110* 107*  BUN 15 10 10   CREATININE 0.92 0.73 0.79  CALCIUM 8.7* 8.9 8.6*  GFRNONAA >60 >60 >60  PROT 7.6 7.8 7.2  ALBUMIN 3.7 3.6 3.6  AST 21 20 15   ALT 12 12 10   ALKPHOS 100 95 78  BILITOT 0.4 0.6 0.2*   Component Ref Range & Units 1 mo ago (06/25/22) 4 mo ago (03/26/22) 6 mo ago (01/22/22) 8 mo ago (11/23/21) 1 yr ago (06/23/21) 1 yr ago (04/20/21) 1 yr ago (01/23/21)  CA 19-9 0 - 35 U/mL 24 21 CM 17 CM 12 CM 14 CM 13 CM 16 CM     RADIOGRAPHIC STUDIES: I have personally reviewed the radiological images as listed and agreed with the findings in the report. No results found.  ASSESSMENT & PLAN:   Cancer of head of pancreas (HCC) #  Pancreas adenocarcinoma-Stage IB-/boderline resectable [Abutment of the SMV s/p neoadjuvant FOLFIRINX. #10 cycles]-s/p Whipple's [on April 19th 2022]- ypT2 [3.5cm]; pN0/11. 24th, APRIL 2024-  Whipple procedure with stable positioning of the pancreatic stent. No  evidence of recurrent or metastatic disease.  # Anemia- microcytic- Stable 9-10; NO IDA- ;  colo [EUS- 2021; oct 2022- colo; Dr.Anna]; stable.   # Chronic mild hypokalemia recommend dietary supplementation.   # PN--1-2- on cymbalta; worse; recently PCP- gapapetin increased to 600 mg-monitor for now.  Discussed regarding acupuncture/costs.  # Hypertension-poorly controlled 180s]; Continue Norvasc/Metoprlol.[ per pt At home- 130/70s ;AGAIN RE-ITERATED to bring log. stable.  # mediport:  continue port flushes q 2-41M. discussed re: pro and cons of keeping the port vs. Explantation.   We will keep it for now.- stable.   # #Incidental findings on CT Imaging dated:APRIL 2024- Liver appears slightly steatotic; Aortic atherosclerosis  I reviewed/discussed/counseled the patient.   # DISPOSITION:  # port flush in 2 months-  # Follow up in 4  months- MD; labs; cbc/cmp/ca-19-9; iron studies; ferritin-Vit D 25 OH levels; port flush; CT AP prior- -Dr.B  # I reviewed the blood work- with the patient in detail; also reviewed the imaging independently [as summarized above]; and with the patient in detail.      Earna Coder, MD 09/22/2022

## 2022-09-22 NOTE — Progress Notes (Signed)
No concerns for the provider today. 

## 2022-09-22 NOTE — Assessment & Plan Note (Addendum)
#    Pancreas adenocarcinoma-Stage IB-/boderline resectable [Abutment of the SMV s/p neoadjuvant FOLFIRINX. #10 cycles]-s/p Whipple's [on April 19th 2022]- ypT2 [3.5cm]; pN0/11. 24th, APRIL 2024-  Whipple procedure with stable positioning of the pancreatic stent. No evidence of recurrent or metastatic disease.  # Anemia- microcytic- Stable 9-10; NO IDA- ;  colo [EUS- 2021; oct 2022- colo; Dr.Anna]; stable.   # Chronic mild hypokalemia recommend dietary supplementation.   # PN--1-2- on cymbalta; worse; recently PCP- gapapetin increased to 600 mg-monitor for now.  Discussed regarding acupuncture/costs.  # Hypertension-poorly controlled 180s]; Continue Norvasc/Metoprlol.[ per pt At home- 130/70s ;AGAIN RE-ITERATED to bring log. stable.  # mediport:  continue port flushes q 2-66M. discussed re: pro and cons of keeping the port vs. Explantation.   We will keep it for now.- stable.   # #Incidental findings on CT Imaging dated:APRIL 2024- Liver appears slightly steatotic; Aortic atherosclerosis  I reviewed/discussed/counseled the patient.   # DISPOSITION:  # port flush in 2 months-  # Follow up in 4  months- MD; labs; cbc/cmp/ca-19-9; iron studies; ferritin-Vit D 25 OH levels; port flush; CT AP prior- -Dr.B  # I reviewed the blood work- with the patient in detail; also reviewed the imaging independently [as summarized above]; and with the patient in detail.

## 2022-09-23 LAB — CANCER ANTIGEN 19-9: CA 19-9: 65 U/mL — ABNORMAL HIGH (ref 0–35)

## 2022-10-19 ENCOUNTER — Other Ambulatory Visit: Payer: Self-pay | Admitting: Internal Medicine

## 2022-10-19 ENCOUNTER — Ambulatory Visit
Admission: RE | Admit: 2022-10-19 | Discharge: 2022-10-19 | Disposition: A | Discharge status: Home / Self Care | Payer: 59 | Source: Ambulatory Visit | Attending: Internal Medicine | Admitting: Internal Medicine

## 2022-10-19 DIAGNOSIS — M25562 Pain in left knee: Secondary | ICD-10-CM

## 2022-10-19 DIAGNOSIS — M1712 Unilateral primary osteoarthritis, left knee: Secondary | ICD-10-CM

## 2022-10-21 ENCOUNTER — Other Ambulatory Visit: Payer: Self-pay | Admitting: Internal Medicine

## 2022-10-21 DIAGNOSIS — C250 Malignant neoplasm of head of pancreas: Secondary | ICD-10-CM

## 2022-10-21 NOTE — Progress Notes (Signed)
Called pt re; elevated cancer marker.   Please schedule CAT scan ASAP- follow up in one week- MD labs- CBCCMP, ca-19-9. Thank

## 2022-10-22 ENCOUNTER — Other Ambulatory Visit: Payer: Self-pay | Admitting: *Deleted

## 2022-10-22 DIAGNOSIS — C250 Malignant neoplasm of head of pancreas: Secondary | ICD-10-CM

## 2022-10-25 ENCOUNTER — Ambulatory Visit
Admission: RE | Admit: 2022-10-25 | Discharge: 2022-10-25 | Disposition: A | Discharge status: Home / Self Care | Payer: 59 | Source: Ambulatory Visit | Attending: Internal Medicine | Admitting: Internal Medicine

## 2022-10-25 DIAGNOSIS — C250 Malignant neoplasm of head of pancreas: Secondary | ICD-10-CM | POA: Diagnosis present

## 2022-10-25 MED ORDER — IOHEXOL 300 MG/ML  SOLN
100.0000 mL | Freq: Once | INTRAMUSCULAR | Status: AC | PRN
  Administered 2022-10-25: 100 mL via INTRAVENOUS

## 2022-10-27 ENCOUNTER — Telehealth: Payer: Self-pay | Admitting: Internal Medicine

## 2022-10-27 NOTE — Telephone Encounter (Signed)
Spoke to patient regarding results of the CT scan-no evidence of any recurrence.  Recommend keep appointments as planned-  GB  FYI-

## 2022-10-29 ENCOUNTER — Encounter: Payer: Self-pay | Admitting: Internal Medicine

## 2022-10-29 ENCOUNTER — Inpatient Hospital Stay (HOSPITAL_BASED_OUTPATIENT_CLINIC_OR_DEPARTMENT_OTHER): Payer: 59 | Admitting: Internal Medicine

## 2022-10-29 ENCOUNTER — Telehealth: Payer: Self-pay | Admitting: *Deleted

## 2022-10-29 ENCOUNTER — Other Ambulatory Visit: Payer: Self-pay | Admitting: *Deleted

## 2022-10-29 ENCOUNTER — Inpatient Hospital Stay: Payer: 59 | Attending: Internal Medicine

## 2022-10-29 VITALS — BP 171/85 | HR 94 | Temp 96.3°F | Ht 64.0 in | Wt 185.8 lb

## 2022-10-29 DIAGNOSIS — I700 Atherosclerosis of aorta: Secondary | ICD-10-CM | POA: Insufficient documentation

## 2022-10-29 DIAGNOSIS — Z79899 Other long term (current) drug therapy: Secondary | ICD-10-CM | POA: Insufficient documentation

## 2022-10-29 DIAGNOSIS — C250 Malignant neoplasm of head of pancreas: Secondary | ICD-10-CM

## 2022-10-29 DIAGNOSIS — G629 Polyneuropathy, unspecified: Secondary | ICD-10-CM | POA: Insufficient documentation

## 2022-10-29 DIAGNOSIS — D509 Iron deficiency anemia, unspecified: Secondary | ICD-10-CM | POA: Diagnosis not present

## 2022-10-29 DIAGNOSIS — R970 Elevated carcinoembryonic antigen [CEA]: Secondary | ICD-10-CM | POA: Insufficient documentation

## 2022-10-29 DIAGNOSIS — I10 Essential (primary) hypertension: Secondary | ICD-10-CM | POA: Diagnosis not present

## 2022-10-29 DIAGNOSIS — E876 Hypokalemia: Secondary | ICD-10-CM | POA: Insufficient documentation

## 2022-10-29 DIAGNOSIS — Z452 Encounter for adjustment and management of vascular access device: Secondary | ICD-10-CM | POA: Diagnosis present

## 2022-10-29 DIAGNOSIS — Z8507 Personal history of malignant neoplasm of pancreas: Secondary | ICD-10-CM | POA: Insufficient documentation

## 2022-10-29 DIAGNOSIS — Z95828 Presence of other vascular implants and grafts: Secondary | ICD-10-CM

## 2022-10-29 LAB — CMP (CANCER CENTER ONLY)
ALT: 12 U/L (ref 0–44)
AST: 19 U/L (ref 15–41)
Albumin: 3.7 g/dL (ref 3.5–5.0)
Alkaline Phosphatase: 83 U/L (ref 38–126)
Anion gap: 10 (ref 5–15)
BUN: 16 mg/dL (ref 8–23)
CO2: 25 mmol/L (ref 22–32)
Calcium: 8.8 mg/dL — ABNORMAL LOW (ref 8.9–10.3)
Chloride: 102 mmol/L (ref 98–111)
Creatinine: 0.91 mg/dL (ref 0.44–1.00)
GFR, Estimated: >60 mL/min (ref >60)
Glucose, Bld: 164 mg/dL — ABNORMAL HIGH (ref 70–99)
Potassium: 2.8 mmol/L — ABNORMAL LOW (ref 3.5–5.1)
Sodium: 137 mmol/L (ref 135–145)
Total Bilirubin: 0.4 mg/dL (ref 0.3–1.2)
Total Protein: 7.7 g/dL (ref 6.5–8.1)

## 2022-10-29 LAB — CBC WITH DIFFERENTIAL (CANCER CENTER ONLY)
Abs Immature Granulocytes: 0.01 10*3/uL (ref 0.00–0.07)
Basophils Absolute: 0 10*3/uL (ref 0.0–0.1)
Basophils Relative: 0 %
Eosinophils Absolute: 0.1 10*3/uL (ref 0.0–0.5)
Eosinophils Relative: 2 %
HCT: 31.9 % — ABNORMAL LOW (ref 36.0–46.0)
Hemoglobin: 10.3 g/dL — ABNORMAL LOW (ref 12.0–15.0)
Immature Granulocytes: 0 %
Lymphocytes Relative: 39 %
Lymphs Abs: 1.9 10*3/uL (ref 0.7–4.0)
MCH: 23.6 pg — ABNORMAL LOW (ref 26.0–34.0)
MCHC: 32.3 g/dL (ref 30.0–36.0)
MCV: 73 fL — ABNORMAL LOW (ref 80.0–100.0)
Monocytes Absolute: 0.3 10*3/uL (ref 0.1–1.0)
Monocytes Relative: 7 %
Neutro Abs: 2.5 10*3/uL (ref 1.7–7.7)
Neutrophils Relative %: 52 %
Platelet Count: 330 10*3/uL (ref 150–400)
RBC: 4.37 MIL/uL (ref 3.87–5.11)
RDW: 15.8 % — ABNORMAL HIGH (ref 11.5–15.5)
WBC Count: 4.8 10*3/uL (ref 4.0–10.5)
nRBC: 0 % (ref 0.0–0.2)

## 2022-10-29 MED ORDER — POTASSIUM CHLORIDE CRYS ER 20 MEQ PO TBCR: 20.0000 meq | EXTENDED_RELEASE_TABLET | Freq: Two times a day (BID) | ORAL | 1 refills | Status: DC

## 2022-10-29 MED ORDER — SODIUM CHLORIDE 0.9% FLUSH
10.0000 mL | Freq: Once | INTRAVENOUS | Status: AC
  Administered 2022-10-29: 10 mL via INTRAVENOUS
  Filled 2022-10-29: qty 10

## 2022-10-29 MED ORDER — HEPARIN SOD (PORK) LOCK FLUSH 100 UNIT/ML IV SOLN
500.0000 [IU] | Freq: Once | INTRAVENOUS | Status: AC
  Administered 2022-10-29: 500 [IU] via INTRAVENOUS
  Filled 2022-10-29: qty 5

## 2022-10-29 NOTE — Assessment & Plan Note (Addendum)
#    Pancreas adenocarcinoma-Stage IB-/boderline resectable [Abutment of the SMV s/p neoadjuvant FOLFIRINX. #10 cycles]-s/p Whipple's [on April 19th 2022]- ypT2 [3.5cm]; pN0/11.   # CT CAP- 24th, APRIL 2024-  Whipple procedure with stable positioning of the pancreatic stent. No evidence of recurrent or metastatic disease; However, Tumor marker- ca- 19-9- 65; elevated JUNE 2024. Awaiting labs from today. If still elevated will get PET scan.   # Anemia- microcytic- Stable 9-10; NO IDA- ;  colo [EUS- 2021; oct 2022- colo; Dr.Anna]; stable.   # Severe hypokalemia potassium 2.8-recommend K-Dur 20 mEq twice daily.  Prescription sent to patient.   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  Discussed regarding acupuncture/costs- stable.   # Hypertension-poorly controlled 180s]; Continue Norvasc/Metoprlol.[ per pt At home- 130/70s ;AGAIN RE-ITERATED to bring log. stable.  # mediport:  continue port flushes q 2-84M. discussed re: pro and cons of keeping the port vs. Explantation.   We will keep it for now.- stable.   # #Incidental findings on CT Imaging dated:APRIL 2024- Liver appears slightly steatotic; Aortic atherosclerosis  I reviewed/discussed/counseled the patient.   # DISPOSITION:  # keep October appt as planned. -Dr.B  # I reviewed the blood work- with the patient in detail; also reviewed the imaging independently [as summarized above]; and with the patient in detail.

## 2022-10-29 NOTE — Progress Notes (Signed)
No concerns today 

## 2022-10-29 NOTE — Progress Notes (Signed)
[facility] CONSULT NOTE  Patient Care Team: Entzminger, Danton Clap, MD as PCP - General (Internal Medicine) Toy Cookey, FNP (Family Medicine) Jim Like, RN as Registered Nurse Scarlett Presto, RN (Inactive) as Registered Nurse Benita Gutter, RN as Oncology Nurse Navigator Almond Lint, MD as Consulting Physician (General Surgery) Earna Coder, MD as Consulting Physician (Internal Medicine)  CHIEF COMPLAINTS/PURPOSE OF CONSULTATION: pancreas adenocarcinoma  Oncology History Overview Note  #Pancreas adenocarcinoma-Stage IB- [id number]-[EUS- Dr.Spaete; [facility]/GI; mass 22x59mm; invading/abutting superior mesenteric vein; 2 enlarged lymph nodes peripancreatic/porta hepatis largest 10 x 5.8 mm-nonpathologic based on EUS criteria/no biopsy]-borderline resectable.  PET scan no evidence of distant metastatic disease.  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 Fredirick Maudlin September]  # 01/02/2020-FOLFIRINOX; Neulasta. S/p FOLFIRINOX 10 cycles- April 19th whipples-  s/p neoadjuvant FOLFIRINX. #10 cycles]-s/p Whipple's [on April 19th 2022]- ypT2 [3.5cm]; pN0/11;   DIS-CONTINUED  FOLFIRINOX cycle #11 & 12-  Sec to PN-2-3.  PANCREAS (EXOCRINE), CARCINOMA: Resection  Procedure: Whipple procedure  Tumor Site: Head of pancreas.  Tumor Size: 3.5 cm, slide measurement.  Histologic Type: Pancreatic ductal adenocarcinoma.  Histologic Grade: Moderately differentiated.  Tumor Extension: Into peripancreatic connective tissue.  Treatment Effect: Moderate treatment effect.  Lymphovascular Invasion: Not identified.  Perineural Invasion: Present.  Margins: All surgical margins are negative for carcinoma.  Regional Lymph Nodes:       Number of Lymph Nodes with Tumor: 0       Number of Lymph Nodes Examined: 15  Distant Metastasis:       Distant  Site(s) Involved: Not applicable.  Pathologic Stage Classification (pTNM, AJCC 8th Edition): ypT2, ypN0   # Borderline DM; HTN   # SURVIVORSHIP:   # GENETICS:   DIAGNOSIS: Pancreatic cancer  STAGE:  IB/borderline resectable;  GOALS: cure    Cancer of head of pancreas (HCC)  12/04/2019 Initial Diagnosis   Cancer of head of pancreas (HCC)   12/19/2019 - 12/19/2019 Chemotherapy   The patient had PACLitaxel-protein bound (ABRAXANE) chemo infusion 250 mg, 125 mg/m2 = 250 mg, Intravenous,  Once, 1 of 4 cycles Administration: 250 mg (12/19/2019) gemcitabine (GEMZAR) 2,000 mg in sodium chloride 0.9 % 250 mL chemo infusion, 1,976 mg, Intravenous,  Once, 1 of 4 cycles Administration: 2,000 mg (12/19/2019)  for chemotherapy treatment.    01/02/2020 - 05/15/2020 Chemotherapy   Patient is on Treatment Plan : PANCREAS Modified FOLFIRINOX q14d x 4 cycles      HISTORY OF PRESENTING ILLNESS: Patient ambulating-independently.  Alone.  Robin Daniels 67 y.o.  female borderline resectable stage IB pancreatic adeno ca currently s/p neoadjuvant chemotherapy with FOLFIRINOX x10 cycles-status post Whipple's on July 15, 2020  is here for follow-up-review of the CT scan.   Patient had elevated tumor marker on recent blood work.  Patient had a CT scan for further evaluation.  Patient continues to have tingling and numbness in the extremities- currently on gabapentin not significantly better or worse.  No falls.  Denies any worsening abdominal pain.  No nausea no vomiting.  No fever no chills.Weight is stable.   Review of Systems  Constitutional:  Negative for chills, diaphoresis, fever and malaise/fatigue.  HENT:  Negative for nosebleeds and sore throat.   Eyes:  Negative for double vision.  Respiratory:  Negative for cough, hemoptysis, sputum production, shortness of breath and wheezing.   Cardiovascular:  Negative for chest pain, palpitations,  orthopnea and leg swelling.  Gastrointestinal:  Negative for  abdominal pain, blood in stool, constipation, diarrhea, heartburn, melena, nausea and vomiting.  Genitourinary:  Negative for dysuria, frequency and urgency.  Musculoskeletal:  Negative for back pain and joint pain.  Skin: Negative.  Negative for itching and rash.  Neurological:  Positive for tingling. Negative for dizziness, focal weakness, weakness and headaches.  Endo/Heme/Allergies:  Does not bruise/bleed easily.  Psychiatric/Behavioral:  Negative for depression. The patient is not nervous/anxious and does not have insomnia.      MEDICAL HISTORY:  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/04/2019   Complication of anesthesia    Fluttering heart    Hyperlipidemia    Hypertension    PONV (postoperative nausea and vomiting)    Pre-diabetes    Vertigo    none recently    SURGICAL HISTORY: Past Surgical History:  Procedure Laterality Date   CATARACT EXTRACTION W/PHACO Right 06/09/2021   Procedure: CATARACT EXTRACTION PHACO AND INTRAOCULAR LENS PLACEMENT (IOC) RIGHT malyugin;  Surgeon: Galen Manila, MD;  Location: [facility];  Service: Ophthalmology;  Laterality: Right;  12.40 1:07.0   COLONOSCOPY WITH PROPOFOL N/A 01/13/2021   Procedure: COLONOSCOPY WITH PROPOFOL;  Surgeon: Wyline Mood, MD;  Location[facility] END[facility]COPY;  Service: Gastroenterology;  Laterality: N/A;   ENDOSCOPIC RETROGRADE CHOLANGIOPANCREATOGRAPHY (ERCP) WITH PROPOFOL N/A 11/16/2019   Procedure: ENDOSCOPIC RETROGRADE CHOLANGIOPANCREATOGRAPHY (ERCP) WITH PROPOFOL;  Surgeon: Midge Minium, MD;  Location: [facility] ENDOSCOPY;  Service: Endoscopy;  Laterality: N/A;   ERCP N/A 03/27/2020   Procedure: ENDOSCOPIC RETROGRADE CHOLANGIOPANCREATOGRAPHY (ERCP);  Surgeon: Midge Minium, MD;  Location: [facility] ENDOSCOPY;  Service: Endoscopy;  Laterality: N/A;   EUS N/A 11/29/2019   Procedure: FULL UPPER ENDOSCOPIC ULTRASOUND (EUS) RADIAL;   Surgeon: Doren Custard, MD;  Location: [facility] ENDOSCOPY;  Service: Gastroenterology;  Laterality: N/A;   IR FLUORO RM 30-60 MIN  07/30/2020   IR IMAGING GUIDED PORT INSERTION  12/14/2019   JEJUNOSTOMY Left 07/15/2020   Procedure: Rance Muir;  Surgeon: Almond Lint, MD;  Location: [facility] OR;  Service: General;  Laterality: Left;   LAPAROSCOPY N/A 07/15/2020   Procedure: DIAGNOSTIC LAPAROSCOPY;  Surgeon: Almond Lint, MD;  Location: [facility] OR;  Service: General;  Laterality: N/A;   right knee replacement Right [id number]   SHOULDER ARTHROSCOPY W/ ROTATOR CUFF REPAIR Right    WHIPPLE PROCEDURE N/A 07/15/2020   Procedure: WHIPPLE PROCEDURE;  Surgeon: Almond Lint, MD;  Location[facility] OR;  Service: General;  Laterality: N/A;    SOCIAL HISTORY: Social History   Socioeconomic History   Marital status: Divorced    Spouse name: Not on file   Number of children: Not on file   Years of education: Not on file   Highest education level: Not on file  Occupational History   Not on file  Tobacco Use   Smoking status: Never   Smokeless tobacco: Never  Vaping Use   Vaping status: Never Used  Substance and Sexual Activity   Alcohol use: No    Alcohol/week: 0.0 standard drinks of alcohol   Drug use: No   Sexual activity: Never  Other Topics Concern   Not on file  Social History Narrative   ** Merged History Encounter **       Lives in [place]; with mom and son; worked in dietary; never smoked; no alcohol.    Social Determinants of Health  Financial Resource Strain: Not on file  Food Insecurity: Not on file  Transportation Needs: Not on file  Physical Activity: Not on file  Stress: Not on file  Social Connections: Not on file  Intimate Partner Violence: Not on file    FAMILY HISTORY: Family History  Problem Relation Age of Onset   Diabetes Mother    Hyperlipidemia Mother    Hypertension Mother    Vision loss Mother    Arthritis Father    Hyperlipidemia Father    Hypertension Father     Breast cancer Maternal Aunt     ALLERGIES:  has No Known Allergies.  MEDICATIONS:  Current Outpatient Medications  Medication Sig Dispense Refill   acetaminophen (TYLENOL) 325 MG tablet Take 650 mg by mouth every 6 (six) hours as needed for moderate pain.     amLODipine (NORVASC) 10 MG tablet Take 1 tablet (10 mg total) by mouth daily. 30 tablet 0   Calcium Carbonate-Vit D-Min (CALCIUM 600+D PLUS MINERALS PO) Take 1 tablet by mouth in the morning and at bedtime.     DULoxetine (CYMBALTA) 20 MG capsule Take 1 capsule by mouth once daily (Patient not taking: Reported on 11/23/2021) 30 capsule 0   gabapentin (NEURONTIN) 300 MG capsule Take 300 mg by mouth daily.     lisinopril (ZESTRIL) 10 MG tablet Take 10 mg by mouth daily.     metoprolol succinate (TOPROL-XL) 50 MG 24 hr tablet Take 1 tablet (50 mg total) by mouth daily. Take with or immediately following a meal. 30 tablet 0   Potassium Chloride ER 20 MEQ TBCR Take 1 tablet (20 mEq total) by mouth daily. 60 tablet 1   vitamin B-12 (CYANOCOBALAMIN) 1000 MCG tablet Take 1 tablet (1,000 mcg total) by mouth daily.     No current facility-administered medications for this visit.   Facility-Administered Medications Ordered in Other Visits  Medication Dose Route Frequency Provider Last Rate Last Admin   sodium chloride flush (NS) 0.9 % injection 10 mL  10 mL Intravenous PRN Louretta Shorten R, MD   10 mL at 06/23/21 1012      PHYSICAL EXAMINATION: ECOG PERFORMANCE STATUS: 0 - Asymptomatic  Vitals:   10/29/22 0939  BP: (!) 171/85  Pulse: 94  Temp: (!) 96.3 F (35.7 C)  SpO2: 99%   Filed Weights   10/29/22 0939  Weight: 185 lb 12.8 oz (84.3 kg)   Physical Exam Vitals reviewed.  Constitutional:      Appearance: She is not ill-appearing.  HENT:     Head: Normocephalic and atraumatic.     Mouth/Throat:     Pharynx: No oropharyngeal exudate.  Eyes:     General: No scleral icterus. Cardiovascular:     Rate and Rhythm: Normal  rate and regular rhythm.  Pulmonary:     Effort: Pulmonary effort is normal. No respiratory distress.     Breath sounds: No wheezing.  Abdominal:     General: There is no distension.     Palpations: Abdomen is soft.     Tenderness: There is no abdominal tenderness. There is no guarding.  Musculoskeletal:        General: No tenderness or deformity.  Lymphadenopathy:     Cervical: No cervical adenopathy.  Skin:    General: Skin is warm.     Coloration: Skin is not pale.  Neurological:     Mental Status: She is alert and oriented to person, place, and time.  Psychiatric:  Mood and Affect: Mood and affect normal.        Behavior: Behavior normal.    LABORATORY DATA:  I have reviewed the data as listed Lab Results  Component Value Date   WBC 4.8 10/29/2022   HGB 10.3 (L) 10/29/2022   HCT 31.9 (L) 10/29/2022   MCV 73.0 (L) 10/29/2022   PLT 330 10/29/2022   Recent Labs    06/25/22 1025 09/22/22 1016 10/29/22 0944  NA 138 137 137  K 3.0* 3.2* 2.8*  CL 102 103 102  CO2 26 26 25   GLUCOSE 110* 107* 164*  BUN 10 10 16   CREATININE 0.73 0.79 0.91  CALCIUM 8.9 8.6* 8.8*  GFRNONAA >60 >60 >60  PROT 7.8 7.2 7.7  ALBUMIN 3.6 3.6 3.7  AST 20 15 19   ALT 12 10 12   ALKPHOS 95 78 83  BILITOT 0.6 0.2* 0.4   Component Ref Range & Units 1 mo ago (06/25/22) 4 mo ago (03/26/22) 6 mo ago (01/22/22) 8 mo ago (11/23/21) 1 yr ago (06/23/21) 1 yr ago (04/20/21) 1 yr ago (01/23/21)  CA 19-9 0 - 35 U/mL 24 21 CM 17 CM 12 CM 14 CM 13 CM 16 CM     RADIOGRAPHIC STUDIES: I have personally reviewed the radiological images as listed and agreed with the findings in the report. CT CHEST ABDOMEN PELVIS W CONTRAST  Result Date: 10/27/2022 CLINICAL DATA:  Pancreatic cancer restaging, status post Whipple procedure * Tracking Code: BO * EXAM: CT CHEST, ABDOMEN, AND PELVIS WITH CONTRAST TECHNIQUE: Multidetector CT imaging of the chest, abdomen and pelvis was performed following the standard  protocol during bolus administration of intravenous contrast. RADIATION DOSE REDUCTION: This exam was performed according to the departmental dose-optimization program which includes automated exposure control, adjustment of the mA and/or kV according to patient size and/or use of iterative reconstruction technique. CONTRAST:  OMNIPAQUE IOHEXOL 300 MG/ML  SOLN COMPARISON:  CT abdomen pelvis, 07/19/2022, CT chest, 04/23/2022 FINDINGS: CT CHEST FINDINGS Cardiovascular: Right chest port catheter. Aortic atherosclerosis. Normal heart size. Left coronary artery calcifications. No pericardial effusion. Mediastinum/Nodes: No enlarged mediastinal, hilar, or axillary lymph nodes. Thyroid gland, trachea, and esophagus demonstrate no significant findings. Lungs/Pleura: Minimal centrilobular emphysema. Background of fine ground-glass centrilobular nodules, most concentrated in the lung apices. No pleural effusion or pneumothorax. 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 Musculoskeletal: No chest wall abnormality. No acute osseous findings. CT ABDOMEN PELVIS FINDINGS Hepatobiliary: No focal liver abnormality is seen. Status post cholecystectomy and hepatojejunostomy. Unchanged mild postoperative biliary ductal dilatation and pneumobilia. Pancreas: Status post Whipple pancreaticoduodenectomy with pancreaticojejunostomy. Pancreatic duct stent remains in unchanged position (series 2, image 61). No pancreatic ductal dilatation or surrounding inflammatory changes. Spleen: Normal in size without significant abnormality. Adrenals/Urinary Tract: Adrenal glands are unremarkable. Kidneys are normal, without renal calculi, solid lesion, or hydronephrosis. Bladder is unremarkable. Stomach/Bowel: Status post distal gastrectomy and gastrojejunostomy. Appendix appears normal. No evidence of bowel wall thickening, distention, or  inflammatory changes. Descending and sigmoid diverticulosis. Vascular/Lymphatic: Scattered aortic atherosclerosis. No enlarged abdominal or pelvic lymph nodes. Reproductive: No mass or other abnormality. Other: Small midline ventral hernia containing nonobstructed loops of small bowel (series 2, image 77) no ascites. Musculoskeletal: No acute osseous findings. IMPRESSION: 1. Status post Whipple pancreaticoduodenectomy with pancreaticojejunostomy and other associated postoperative findings. Pancreatic duct stent remains in unchanged position. No pancreatic ductal dilatation. 2. No evidence of lymphadenopathy or metastatic disease in the  chest, abdomen, or pelvis. 3. Coronary artery disease. Aortic Atherosclerosis (ICD10-I70.0) and Emphysema (ICD10-J43.9). Electronically Signed   By: Jearld Lesch M.D.   On: 10/27/2022 08:14   DG Knee 3 Views Left  Result Date: 10/26/2022 CLINICAL DATA:  Acute pain of left knee. Primary osteoarthritis of left knee. Pain for 2 weeks. EXAM: LEFT KNEE - 3 VIEW COMPARISON:  None Available. FINDINGS: Medial tibiofemoral joint space narrowing. There is moderate tricompartmental peripheral spurring. Small knee joint effusion. No fracture. No evidence of focal bone abnormality or bone destruction. Small quadriceps tendon enthesophyte. No focal soft tissue abnormalities are seen. IMPRESSION: Moderate tricompartmental osteoarthritis with small joint effusion. Electronically Signed   By: Narda Rutherford M.D.   On: 10/26/2022 11:16    ASSESSMENT & PLAN:   Cancer of head of pancreas (HCC) #  Pancreas adenocarcinoma-Stage IB-/boderline resectable [Abutment of the SMV s/p neoadjuvant FOLFIRINX. #10 cycles]-s/p Whipple's [on April 19th 2022]- ypT2 [3.5cm]; pN0/11.   # CT CAP- 24th, APRIL 2024-  Whipple procedure with stable positioning of the pancreatic stent. No evidence of recurrent or metastatic disease; However, Tumor marker- ca- 19-9- 65; elevated JUNE 2024. Awaiting labs from today.  If still elevated will get PET scan.   # Anemia- microcytic- Stable 9-10; NO IDA- ;  colo [EUS- 2021; oct 2022- colo; Dr.Anna]; stable.   # Chronic mild hypokalemia recommend dietary supplementation- stable.    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  Discussed regarding acupuncture/costs- stable.   # Hypertension-poorly controlled 180s]; Continue Norvasc/Metoprlol.[ per pt At home- 130/70s ;AGAIN RE-ITERATED to bring log. stable.  # mediport:  continue port flushes q 2-55M. discussed re: pro and cons of keeping the port vs. Explantation.   We will keep it for now.- stable.   # #Incidental findings on CT Imaging dated:APRIL 2024- Liver appears slightly steatotic; Aortic atherosclerosis  I reviewed/discussed/counseled the patient.   # DISPOSITION:  # keep October appt as planned. -Dr.B  # I reviewed the blood work- with the patient in detail; also reviewed the imaging independently [as summarized above]; and with the patient in detail.      Earna Coder, MD 10/29/2022

## 2022-10-29 NOTE — Telephone Encounter (Signed)
Informed pt her potassium is low; Prescription has been sent to her pharmacy; to take 1 pill twice a day until we tell her otherwise. Pt understands.

## 2022-11-01 ENCOUNTER — Other Ambulatory Visit: Payer: Self-pay | Admitting: Internal Medicine

## 2022-11-01 ENCOUNTER — Other Ambulatory Visit: Payer: Self-pay

## 2022-11-01 DIAGNOSIS — C250 Malignant neoplasm of head of pancreas: Secondary | ICD-10-CM

## 2022-11-01 NOTE — Progress Notes (Signed)
Spoke to Pt re: rising CA-19-9. Recommend PET ASAP.  Please schedule follow up in 3-4 days after PET scan- MD; labs- bmp.  Thanks GB

## 2022-11-01 NOTE — Progress Notes (Signed)
Lab order entered.

## 2022-11-09 ENCOUNTER — Ambulatory Visit
Admission: RE | Admit: 2022-11-09 | Discharge: 2022-11-09 | Disposition: A | Discharge status: Home / Self Care | Payer: 59 | Source: Ambulatory Visit | Attending: Internal Medicine | Admitting: Internal Medicine

## 2022-11-09 DIAGNOSIS — C250 Malignant neoplasm of head of pancreas: Secondary | ICD-10-CM | POA: Diagnosis present

## 2022-11-09 LAB — GLUCOSE, CAPILLARY: Glucose-Capillary: 113 mg/dL — ABNORMAL HIGH (ref 70–99)

## 2022-11-09 MED ORDER — FLUDEOXYGLUCOSE F - 18 (FDG) INJECTION
10.1300 | Freq: Once | INTRAVENOUS | Status: AC | PRN
  Administered 2022-11-09: 10.13 via INTRAVENOUS

## 2022-11-17 ENCOUNTER — Inpatient Hospital Stay: Payer: 59

## 2022-11-17 ENCOUNTER — Encounter: Payer: Self-pay | Admitting: Internal Medicine

## 2022-11-17 ENCOUNTER — Inpatient Hospital Stay (HOSPITAL_BASED_OUTPATIENT_CLINIC_OR_DEPARTMENT_OTHER): Payer: 59 | Admitting: Internal Medicine

## 2022-11-17 VITALS — Ht 64.0 in | Wt 182.6 lb

## 2022-11-17 DIAGNOSIS — Z95828 Presence of other vascular implants and grafts: Secondary | ICD-10-CM

## 2022-11-17 DIAGNOSIS — C250 Malignant neoplasm of head of pancreas: Secondary | ICD-10-CM | POA: Diagnosis not present

## 2022-11-17 DIAGNOSIS — Z452 Encounter for adjustment and management of vascular access device: Secondary | ICD-10-CM | POA: Diagnosis not present

## 2022-11-17 LAB — BASIC METABOLIC PANEL
Anion gap: 8 (ref 5–15)
BUN: 10 mg/dL (ref 8–23)
CO2: 23 mmol/L (ref 22–32)
Calcium: 8.9 mg/dL (ref 8.9–10.3)
Chloride: 105 mmol/L (ref 98–111)
Creatinine, Ser: 0.8 mg/dL (ref 0.44–1.00)
GFR, Estimated: >60 mL/min (ref >60)
Glucose, Bld: 127 mg/dL — ABNORMAL HIGH (ref 70–99)
Potassium: 3.3 mmol/L — ABNORMAL LOW (ref 3.5–5.1)
Sodium: 136 mmol/L (ref 135–145)

## 2022-11-17 MED ORDER — HEPARIN SOD (PORK) LOCK FLUSH 100 UNIT/ML IV SOLN
500.0000 [IU] | Freq: Once | INTRAVENOUS | Status: AC
  Administered 2022-11-17: 500 [IU] via INTRAVENOUS
  Filled 2022-11-17: qty 5

## 2022-11-17 MED ORDER — SODIUM CHLORIDE 0.9% FLUSH
10.0000 mL | Freq: Once | INTRAVENOUS | Status: AC
  Administered 2022-11-17: 10 mL via INTRAVENOUS
  Filled 2022-11-17: qty 10

## 2022-11-17 NOTE — Progress Notes (Signed)
Pt voices being so stressed about hearing bad news about her cancer. Can't sleep. Has an appetite but the stress makes her not able to eat. Denies pain or nausea. She is 2 years out from her whipple surgery. Recent PET scan. Patient requested not to have vitals checked as she is far too stressed.

## 2022-11-17 NOTE — Progress Notes (Signed)
[facility] CONSULT NOTE  Patient Care Team: Entzminger, Danton Clap, MD as PCP - General (Internal Medicine) Toy Cookey, FNP (Family Medicine) Jim Like, RN as Registered Nurse Scarlett Presto, RN (Inactive) as Registered Nurse Benita Gutter, RN as Oncology Nurse Navigator Almond Lint, MD as Consulting Physician (General Surgery) Earna Coder, MD as Consulting Physician (Internal Medicine)  CHIEF COMPLAINTS/PURPOSE OF CONSULTATION: pancreas adenocarcinoma  Oncology History Overview Note  #Pancreas adenocarcinoma-Stage IB- [id number]-[EUS- Dr.Spaete; [facility]/GI; mass 22x4mm; invading/abutting superior mesenteric vein; 2 enlarged lymph nodes peripancreatic/porta hepatis largest 10 x 5.8 mm-nonpathologic based on EUS criteria/no biopsy]-borderline resectable.  PET scan no evidence of distant metastatic disease.  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 Fredirick Maudlin September]  # 01/02/2020-FOLFIRINOX; Neulasta. S/p FOLFIRINOX 10 cycles- April 19th whipples-  s/p neoadjuvant FOLFIRINX. #10 cycles]-s/p Whipple's [on April 19th 2022]- ypT2 [3.5cm]; pN0/11;   DIS-CONTINUED  FOLFIRINOX cycle #11 & 12-  Sec to PN-2-3.  PANCREAS (EXOCRINE), CARCINOMA: Resection  Procedure: Whipple procedure  Tumor Site: Head of pancreas.  Tumor Size: 3.5 cm, slide measurement.  Histologic Type: Pancreatic ductal adenocarcinoma.  Histologic Grade: Moderately differentiated.  Tumor Extension: Into peripancreatic connective tissue.  Treatment Effect: Moderate treatment effect.  Lymphovascular Invasion: Not identified.  Perineural Invasion: Present.  Margins: All surgical margins are negative for carcinoma.  Regional Lymph Nodes:       Number of Lymph Nodes with Tumor: 0       Number of Lymph Nodes Examined: 15  Distant Metastasis:       Distant  Site(s) Involved: Not applicable.  Pathologic Stage Classification (pTNM, AJCC 8th Edition): ypT2, ypN0   # Borderline DM; HTN   # SURVIVORSHIP:   # GENETICS:   DIAGNOSIS: Pancreatic cancer  STAGE:  IB/borderline resectable;  GOALS: cure    Cancer of head of pancreas (HCC)  12/04/2019 Initial Diagnosis   Cancer of head of pancreas (HCC)   12/19/2019 - 12/19/2019 Chemotherapy   The patient had PACLitaxel-protein bound (ABRAXANE) chemo infusion 250 mg, 125 mg/m2 = 250 mg, Intravenous,  Once, 1 of 4 cycles Administration: 250 mg (12/19/2019) gemcitabine (GEMZAR) 2,000 mg in sodium chloride 0.9 % 250 mL chemo infusion, 1,976 mg, Intravenous,  Once, 1 of 4 cycles Administration: 2,000 mg (12/19/2019)  for chemotherapy treatment.    01/02/2020 - 05/15/2020 Chemotherapy   Patient is on Treatment Plan : PANCREAS Modified FOLFIRINOX q14d x 4 cycles      HISTORY OF PRESENTING ILLNESS: Patient ambulating-independently.  Alone.  Robin Daniels 67 y.o.  female borderline resectable stage IB pancreatic adeno ca currently s/p neoadjuvant chemotherapy with FOLFIRINOX x10 cycles-status post Whipple's on July 15, 2020  is here for follow-up-review of the PET scan.  Patient  voices being so stressed about hearing bad news about her cancer. Can't sleep. Has an appetite but the stress makes her not able to eat.   Denies pain or nausea. She is 2 years out from her whipple surgery. Recent PET scan. Patient requested not to have vitals checked as she is far too stressed.  Patient has been taking potassium pills since last visit.  Patient continues to have tingling and numbness in the extremities- currently on gabapentin not significantly better or worse.  No falls.  Denies any worsening abdominal pain.  No nausea no vomiting.  No fever no chills.Weight is stable.   Review of Systems  Constitutional:  Negative for chills, diaphoresis, fever and malaise/fatigue.  HENT:  Negative for nosebleeds and sore  throat.   Eyes:  Negative for double vision.  Respiratory:  Negative for cough, hemoptysis, sputum production, shortness of breath and wheezing.   Cardiovascular:  Negative for chest pain, palpitations, orthopnea and leg swelling.  Gastrointestinal:  Negative for abdominal pain, blood in stool, constipation, diarrhea, heartburn, melena, nausea and vomiting.  Genitourinary:  Negative for dysuria, frequency and urgency.  Musculoskeletal:  Negative for back pain and joint pain.  Skin: Negative.  Negative for itching and rash.  Neurological:  Positive for tingling. Negative for dizziness, focal weakness, weakness and headaches.  Endo/Heme/Allergies:  Does not bruise/bleed easily.  Psychiatric/Behavioral:  Negative for depression. The patient is not nervous/anxious and does not have insomnia.      MEDICAL HISTORY:  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/04/2019   Complication of anesthesia    Fluttering heart    Hyperlipidemia    Hypertension    PONV (postoperative nausea and vomiting)    Pre-diabetes    Vertigo    none recently    SURGICAL HISTORY: Past Surgical History:  Procedure Laterality Date   CATARACT EXTRACTION W/PHACO Right 06/09/2021   Procedure: CATARACT EXTRACTION PHACO AND INTRAOCULAR LENS PLACEMENT (IOC) RIGHT malyugin;  Surgeon: Galen Manila, MD;  Location: [facility];  Service: Ophthalmology;  Laterality: Right;  12.40 1:07.0   COLONOSCOPY WITH PROPOFOL N/A 01/13/2021   Procedure: COLONOSCOPY WITH PROPOFOL;  Surgeon: Wyline Mood, MD;  Location: [facility] ENDOSCOPY;  Service: Gastroenterology;  Laterality: N/A;   ENDOSCOPIC RETROGRADE CHOLANGIOPANCREATOGRAPHY (ERCP) WITH PROPOFOL N/A 11/16/2019   Procedure: ENDOSCOPIC RETROGRADE CHOLANGIOPANCREATOGRAPHY (ERCP) WITH PROPOFOL;  Surgeon: Midge Minium, MD;  Location: [facility] ENDOSCOPY;  Service: Endoscopy;  Laterality: N/A;    ERCP N/A 03/27/2020   Procedure: ENDOSCOPIC RETROGRADE CHOLANGIOPANCREATOGRAPHY (ERCP);  Surgeon: Midge Minium, MD;  Location: [facility] ENDOSCOPY;  Service: Endoscopy;  Laterality: N/A;   EUS N/A 11/29/2019   Procedure: FULL UPPER ENDOSCOPIC ULTRASOUND (EUS) RADIAL;  Surgeon: Doren Custard, MD;  Location: [facility] ENDOSCOPY;  Service: Gastroenterology;  Laterality: N/A;   IR FLUORO RM 30-60 MIN  07/30/2020   IR IMAGING GUIDED PORT INSERTION  12/14/2019   JEJUNOSTOMY Left 07/15/2020   Procedure: Rance Muir;  Surgeon: Almond Lint, MD;  Location: [facility] OR;  Service: General;  Laterality: Left;   LAPAROSCOPY N/A 07/15/2020   Procedure: DIAGNOSTIC LAPAROSCOPY;  Surgeon: Almond Lint, MD;  Location: [facility] OR;  Service: General;  Laterality: N/A;   right knee replacement Right [id number]   SHOULDER ARTHROSCOPY W/ ROTATOR CUFF REPAIR Right    WHIPPLE PROCEDURE N/A 07/15/2020   Procedure: WHIPPLE PROCEDURE;  Surgeon: Almond Lint, MD;  Location: [facility] OR;  Service: General;  Laterality: N/A;    SOCIAL HISTORY: Social History   Socioeconomic History   Marital status: Divorced    Spouse name: Not on file   Number of children: Not on file   Years of education: Not on file   Highest education level: Not on file  Occupational History   Not on file  Tobacco Use   Smoking status: Never   Smokeless tobacco: Never  Vaping Use   Vaping status: Never Used  Substance and Sexual Activity   Alcohol use: No    Alcohol/week: 0.0 standard drinks of alcohol   Drug use: No   Sexual  activity: Never  Other Topics Concern   Not on file  Social History Narrative   ** Merged History Encounter **       Lives in [place]; with mom and son; worked in dietary; never smoked; no alcohol.    Social Determinants of Health   Financial Resource Strain: Not on file  Food Insecurity: Not on file  Transportation Needs: Not on file  Physical Activity: Not on file  Stress: Not on file  Social Connections: Not on file   Intimate Partner Violence: Not on file    FAMILY HISTORY: Family History  Problem Relation Age of Onset   Diabetes Mother    Hyperlipidemia Mother    Hypertension Mother    Vision loss Mother    Arthritis Father    Hyperlipidemia Father    Hypertension Father    Breast cancer Maternal Aunt     ALLERGIES:  has No Known Allergies.  MEDICATIONS:  Current Outpatient Medications  Medication Sig Dispense Refill   acetaminophen (TYLENOL) 325 MG tablet Take 650 mg by mouth every 6 (six) hours as needed for moderate pain.     amLODipine (NORVASC) 10 MG tablet Take 1 tablet (10 mg total) by mouth daily. 30 tablet 0   Calcium Carbonate-Vit D-Min (CALCIUM 600+D PLUS MINERALS PO) Take 1 tablet by mouth in the morning and at bedtime.     gabapentin (NEURONTIN) 300 MG capsule Take 300 mg by mouth daily.     lisinopril (ZESTRIL) 10 MG tablet Take 10 mg by mouth daily.     metoprolol succinate (TOPROL-XL) 50 MG 24 hr tablet Take 1 tablet (50 mg total) by mouth daily. Take with or immediately following a meal. 30 tablet 0   potassium chloride SA (KLOR-CON M) 20 MEQ tablet Take 1 tablet (20 mEq total) by mouth 2 (two) times daily. 60 tablet 1   vitamin B-12 (CYANOCOBALAMIN) 1000 MCG tablet Take 1 tablet (1,000 mcg total) by mouth daily.     No current facility-administered medications for this visit.   Facility-Administered Medications Ordered in Other Visits  Medication Dose Route Frequency Provider Last Rate Last Admin   sodium chloride flush (NS) 0.9 % injection 10 mL  10 mL Intravenous PRN Earna Coder, MD   10 mL at 06/23/21 1012      PHYSICAL EXAMINATION: ECOG PERFORMANCE STATUS: 0 - Asymptomatic  There were no vitals filed for this visit.  Filed Weights   11/17/22 1326  Weight: 182 lb 9.6 oz (82.8 kg)   Physical Exam Vitals reviewed.  Constitutional:      Appearance: She is not ill-appearing.  HENT:     Head: Normocephalic and atraumatic.     Mouth/Throat:      Pharynx: No oropharyngeal exudate.  Eyes:     General: No scleral icterus. Cardiovascular:     Rate and Rhythm: Normal rate and regular rhythm.  Pulmonary:     Effort: Pulmonary effort is normal. No respiratory distress.     Breath sounds: No wheezing.  Abdominal:     General: There is no distension.     Palpations: Abdomen is soft.     Tenderness: There is no abdominal tenderness. There is no guarding.  Musculoskeletal:        General: No tenderness or deformity.  Lymphadenopathy:     Cervical: No cervical adenopathy.  Skin:    General: Skin is warm.     Coloration: Skin is not pale.  Neurological:     Mental Status: She  is alert and oriented to person, place, and time.  Psychiatric:        Mood and Affect: Mood and affect normal.        Behavior: Behavior normal.    LABORATORY DATA:  I have reviewed the data as listed Lab Results  Component Value Date   WBC 4.8 10/29/2022   HGB 10.3 (L) 10/29/2022   HCT 31.9 (L) 10/29/2022   MCV 73.0 (L) 10/29/2022   PLT 330 10/29/2022   Recent Labs    06/25/22 1025 09/22/22 1016 10/29/22 0944 11/17/22 1312  NA 138 137 137 136  K 3.0* 3.2* 2.8* 3.3*  CL 102 103 102 105  CO2 26 26 25 23   GLUCOSE 110* 107* 164* 127*  BUN 10 10 16 10   CREATININE 0.73 0.79 0.91 0.80  CALCIUM 8.9 8.6* 8.8* 8.9  GFRNONAA >60 >60 >60 >60  PROT 7.8 7.2 7.7  --   ALBUMIN 3.6 3.6 3.7  --   AST 20 15 19   --   ALT 12 10 12   --   ALKPHOS 95 78 83  --   BILITOT 0.6 0.2* 0.4  --    Component Ref Range & Units 1 mo ago (06/25/22) 4 mo ago (03/26/22) 6 mo ago (01/22/22) 8 mo ago (11/23/21) 1 yr ago (06/23/21) 1 yr ago (04/20/21) 1 yr ago (01/23/21)  CA 19-9 0 - 35 U/mL 24 21 CM 17 CM 12 CM 14 CM 13 CM 16 CM     RADIOGRAPHIC STUDIES: I have personally reviewed the radiological images as listed and agreed with the findings in the report. NM PET Image Restage (PS) Skull Base to Thigh (F-18 FDG)  Result Date: 11/15/2022 CLINICAL DATA:  Subsequent  treatment strategy for pancreatic cancer with rising tumor markers. EXAM: NUCLEAR MEDICINE PET SKULL BASE TO THIGH TECHNIQUE: 10.1 mCi F-18 FDG was injected intravenously. Full-ring PET imaging was performed from the skull base to thigh after the radiotracer. CT data was obtained and used for attenuation correction and anatomic localization. Fasting blood glucose: 113 mg/dl COMPARISON:  CT chest abdomen pelvis 10/25/2022 and PET 11/28/2019. FINDINGS: Mediastinal blood pool activity: SUV max 3.0 Liver activity: SUV max NA NECK: No abnormal hypermetabolism. Incidental CT findings: None. CHEST: Possible focal hypermetabolism along the medial aspect of left thyroid, SUV max 3.7, without a definitive CT correlate. No additional abnormal hypermetabolism. Incidental CT findings: Right IJ Port-A-Cath terminates in the low SVC. Atherosclerotic calcification of the aorta and coronary arteries. Heart is mildly enlarged. No pericardial or pleural effusion. 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 ABDOMEN/PELVIS: Abnormal hypermetabolism associated with the pancreaticojejunostomy is centered along the internally in stent, SUV max 7.6 (4/80). Lack of IV contrast limits CT comparison. Hypermetabolic aortocaval lymph node measures approximately 8 mm (4/79), SUV max 3.7. Borderline hypermetabolic left periaortic lymph node measures 6 mm (4/81), SUV max 3.1. No additional abnormal hypermetabolism. Incidental CT findings: Pneumobilia. Liver is otherwise grossly unremarkable. Cholecystectomy. Adrenal glands, kidneys and spleen are grossly unremarkable. Pancreaticoduodenectomy, pancreaticojejunostomy and gastrojejunostomy. Stomach, small bowel and colon are otherwise grossly unremarkable. SKELETON: No abnormal hypermetabolism. Incidental CT findings: Degenerative changes in the spine. IMPRESSION: 1. Whipple procedure with abnormal hypermetabolism centered at the pancreaticojejunostomy, worrisome for disease  recurrence. Associated pancreaticojejunostomy stent in place. 2. Hypermetabolic adjacent retroperitoneal lymph nodes, worrisome for metastatic disease. 3. Focal hypermetabolism along the medial aspect of the left thyroid. Recommend thyroid ultrasound as malignancy cannot be excluded. 4. Aortic atherosclerosis (ICD10-I70.0). Coronary artery calcification. Electronically Signed  By: Leanna Battles M.D.   On: 11/15/2022 11:24   CT CHEST ABDOMEN PELVIS W CONTRAST  Result Date: 10/27/2022 CLINICAL DATA:  Pancreatic cancer restaging, status post Whipple procedure * Tracking Code: BO * EXAM: CT CHEST, ABDOMEN, AND PELVIS WITH CONTRAST TECHNIQUE: Multidetector CT imaging of the chest, abdomen and pelvis was performed following the standard protocol during bolus administration of intravenous contrast. RADIATION DOSE REDUCTION: This exam was performed according to the departmental dose-optimization program which includes automated exposure control, adjustment of the mA and/or kV according to patient size and/or use of iterative reconstruction technique. CONTRAST:  OMNIPAQUE IOHEXOL 300 MG/ML  SOLN COMPARISON:  CT abdomen pelvis, 07/19/2022, CT chest, 04/23/2022 FINDINGS: CT CHEST FINDINGS Cardiovascular: Right chest port catheter. Aortic atherosclerosis. Normal heart size. Left coronary artery calcifications. No pericardial effusion. Mediastinum/Nodes: No enlarged mediastinal, hilar, or axillary lymph nodes. Thyroid gland, trachea, and esophagus demonstrate no significant findings. Lungs/Pleura: Minimal centrilobular emphysema. Background of fine ground-glass centrilobular nodules, most concentrated in the lung apices. No pleural effusion or pneumothorax. 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 Musculoskeletal: No chest wall abnormality. No acute osseous findings. CT ABDOMEN PELVIS FINDINGS Hepatobiliary: No  focal liver abnormality is seen. Status post cholecystectomy and hepatojejunostomy. Unchanged mild postoperative biliary ductal dilatation and pneumobilia. Pancreas: Status post Whipple pancreaticoduodenectomy with pancreaticojejunostomy. Pancreatic duct stent remains in unchanged position (series 2, image 61). No pancreatic ductal dilatation or surrounding inflammatory changes. Spleen: Normal in size without significant abnormality. Adrenals/Urinary Tract: Adrenal glands are unremarkable. Kidneys are normal, without renal calculi, solid lesion, or hydronephrosis. Bladder is unremarkable. Stomach/Bowel: Status post distal gastrectomy and gastrojejunostomy. Appendix appears normal. No evidence of bowel wall thickening, distention, or inflammatory changes. Descending and sigmoid diverticulosis. Vascular/Lymphatic: Scattered aortic atherosclerosis. No enlarged abdominal or pelvic lymph nodes. Reproductive: No mass or other abnormality. Other: Small midline ventral hernia containing nonobstructed loops of small bowel (series 2, image 77) no ascites. Musculoskeletal: No acute osseous findings. IMPRESSION: 1. Status post Whipple pancreaticoduodenectomy with pancreaticojejunostomy and other associated postoperative findings. Pancreatic duct stent remains in unchanged position. No pancreatic ductal dilatation. 2. No evidence of lymphadenopathy or metastatic disease in the chest, abdomen, or pelvis. 3. Coronary artery disease. Aortic Atherosclerosis (ICD10-I70.0) and Emphysema (ICD10-J43.9). Electronically Signed   By: Jearld Lesch M.D.   On: 10/27/2022 08:14   DG Knee 3 Views Left  Result Date: 10/26/2022 CLINICAL DATA:  Acute pain of left knee. Primary osteoarthritis of left knee. Pain for 2 weeks. EXAM: LEFT KNEE - 3 VIEW COMPARISON:  None Available. FINDINGS: Medial tibiofemoral joint space narrowing. There is moderate tricompartmental peripheral spurring. Small knee joint effusion. No fracture. No evidence of focal  bone abnormality or bone destruction. Small quadriceps tendon enthesophyte. No focal soft tissue abnormalities are seen. IMPRESSION: Moderate tricompartmental osteoarthritis with small joint effusion. Electronically Signed   By: Narda Rutherford M.D.   On: 10/26/2022 11:16    ASSESSMENT & PLAN:   Cancer of head of pancreas (HCC) # AUG 2024- STAGE IV/METASTATIC- Recurrent Pancreas adenocarcinoma [2021-Stage IB- s/p neoadjuvant FOLFIRINX. #10 cycles]-s/p Whipple's [on April 19th 2022]- ypT2ypN0.  AUG 18th, 2024- PET scan- Whipple procedure with abnormal hypermetabolism centered at the pancreaticojejunostomy, worrisome for disease recurrence. Associated pancreaticojejunostomy stent in place; Hypermetabolic adjacent retroperitoneal lymph nodes, worrisome for metastatic disease. Ca 19-9 rising.   # Reviewed with the patient regarding concerns for recurrent pancreatic cancer-given the imaging findings and also rising CA 19-9.  #  I reviewed the stage-4 incurable cancer.  However palliative chemotherapy with recommended follow-up controlling the disease/also help live longer.  Discussed that the median survival is around 1 year.  To the patient's question-regarding life expectancy without chemo-my best guess was about 6 months.  After the discussion patient stated she did not want to talk any further at this time.  Patient did not want me to reach out to her family.  She will reach out to family; and bring a family member to the next visit.   # Incidental PET scan- AUG 2024- Focal hypermetabolism along the medial aspect of the left thyroid. Recommend thyroid ultrasound as malignancy cannot be excluded.   # Anemia- microcytic- Stable 9-10; NO IDA- ;  colo [EUS- 2021; oct 2022- colo; Dr.Anna]; stable.   # Severe hypokalemia potassium 2.8-.  Potassium 3.4.  Continue K-Dur 20 mEq twice daily.    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  Discussed regarding acupuncture/costs-  stable.   # Hypertension-poorly controlled 180s]; Continue Norvasc/Metoprlol.[ per pt At home- 130/70s ;AGAIN RE-ITERATED to bring log. stable.  # mediport:  continue port flushes q 2-19M.stable.   # DISPOSITION:  # -NGS-[facility] [path  2022-  [place], [facility]]-  # follow up next Tuesday- MD; no labs- Dr.B  # I reviewed the blood work- with the patient in detail; also reviewed the imaging independently [as summarized above]; and with the patient in detail.   # 40 minutes face-to-face with the patient discussing the above plan of care; more than 50% of time spent on prognosis/ natural history; counseling and coordination.  Earna Coder, MD 11/17/2022

## 2022-11-17 NOTE — Assessment & Plan Note (Addendum)
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 #10 cycles]-s/p Whipple's [on April 19th 2022]- ypT2ypN0.  AUG 18th, 2024- PET scan- Whipple procedure with abnormal hypermetabolism centered at the pancreaticojejunostomy, worrisome for disease recurrence. Associated pancreaticojejunostomy stent in place; Hypermetabolic adjacent retroperitoneal lymph nodes, worrisome for metastatic disease. Ca 19-9 rising.   # Reviewed with the patient regarding concerns for recurrent pancreatic cancer-given the imaging findings and also rising CA 19-9.  # I reviewed the stage-4 incurable cancer.  However palliative chemotherapy with recommended follow-up controlling the disease/also help live longer.  Discussed that the median survival is around 1 year.  To the patient's question-regarding life expectancy without chemo-my best guess was about 6 months.  After the discussion patient stated she did not want to talk any further at this time.  Patient did not want me to reach out to her family.  She will reach out to family; and bring a family member to the next visit.   # Incidental PET scan- AUG 2024- Focal hypermetabolism along the medial aspect of the left thyroid. Recommend thyroid ultrasound as malignancy cannot be excluded.   # Anemia- microcytic- Stable 9-10; NO IDA- ;  colo [EUS- 2021; oct 2022- colo; Dr.Anna]; stable.   # Severe hypokalemia potassium 2.8-.  Potassium 3.4.  Continue K-Dur 20 mEq twice daily.    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  Discussed regarding acupuncture/costs- stable.   # Hypertension-poorly controlled 180s]; Continue Norvasc/Metoprlol.[ per pt At home- 130/70s ;AGAIN RE-ITERATED to bring log. stable.  # mediport:  continue port flushes q 2-50M.stable.   # DISPOSITION:  # -[facility]GS-[facility] [path  2022-  [place], [facility]]-  # follow up next Tuesday- MD; no labs- Dr.B  # I reviewed the blood work-  with the patient in detail; also reviewed the imaging independently [as summarized above]; and with the patient in detail.   # 40 minutes face-to-face with the patient discussing the above plan of care; more than 50% of time spent on prognosis/ natural history; counseling and coordination.

## 2022-11-23 ENCOUNTER — Encounter: Payer: Self-pay | Admitting: Internal Medicine

## 2022-11-23 ENCOUNTER — Inpatient Hospital Stay (HOSPITAL_BASED_OUTPATIENT_CLINIC_OR_DEPARTMENT_OTHER): Payer: 59 | Admitting: Internal Medicine

## 2022-11-23 VITALS — BP 151/79 | HR 96 | Temp 96.2°F | Ht 64.0 in | Wt 179.0 lb

## 2022-11-23 DIAGNOSIS — G629 Polyneuropathy, unspecified: Secondary | ICD-10-CM | POA: Diagnosis not present

## 2022-11-23 DIAGNOSIS — Z452 Encounter for adjustment and management of vascular access device: Secondary | ICD-10-CM | POA: Diagnosis not present

## 2022-11-23 DIAGNOSIS — C250 Malignant neoplasm of head of pancreas: Secondary | ICD-10-CM

## 2022-11-23 MED ORDER — ONDANSETRON HCL 8 MG PO TABS: ORAL_TABLET | ORAL | 1 refills | Status: DC

## 2022-11-23 MED ORDER — PROCHLORPERAZINE MALEATE 10 MG PO TABS: 10.0000 mg | ORAL_TABLET | Freq: Four times a day (QID) | ORAL | 1 refills | Status: DC | PRN

## 2022-11-23 MED ORDER — LIDOCAINE-PRILOCAINE 2.5-2.5 % EX CREA: TOPICAL_CREAM | CUTANEOUS | 3 refills | Status: DC

## 2022-11-23 NOTE — Progress Notes (Signed)
DISCONTINUE ON PATHWAY REGIMEN - Pancreatic Adenocarcinoma     A cycle is every 14 days:     Oxaliplatin      Leucovorin      Irinotecan      Fluorouracil   **Always confirm dose/schedule in your pharmacy ordering system**  REASON: Other Reason PRIOR TREATMENT: PANOS94: mFOLFIRINOX q14 Days x 4 Cycles TREATMENT RESPONSE: Partial Response (PR)  START ON PATHWAY REGIMEN - Pancreatic Adenocarcinoma     A cycle is every 28 days:     Nab-paclitaxel (protein bound)      Gemcitabine   **Always confirm dose/schedule in your pharmacy ordering system**  Patient Characteristics: Metastatic Disease, First Line, PS = 0,1, BRCA1/2 and PALB2  Mutation Absent/Unknown Therapeutic Status: Metastatic Disease Line of Therapy: First Line ECOG Performance Status: 1 BRCA1/2 Mutation Status: Awaiting Test Results PALB2 Mutation Status: Awaiting Test Results Intent of Therapy: Non-Curative / Palliative Intent, Discussed with Patient

## 2022-11-23 NOTE — Progress Notes (Signed)
Pt states she is stressed out. Not able to eat or sleep.

## 2022-11-23 NOTE — Progress Notes (Signed)
[facility] CONSULT NOTE  Patient Care Team: Entzminger, Danton Clap, MD as PCP - General (Internal Medicine) Toy Cookey, FNP (Family Medicine) Jim Like, RN as Registered Nurse Scarlett Presto, RN (Inactive) as Registered Nurse Benita Gutter, RN as Oncology Nurse Navigator Almond Lint, MD as Consulting Physician (General Surgery) Earna Coder, MD as Consulting Physician (Internal Medicine)  CHIEF COMPLAINTS/PURPOSE OF CONSULTATION: pancreas adenocarcinoma  Oncology History Overview Note  #Pancreas adenocarcinoma-Stage IB- [id number]-[EUS- Dr.Spaete; [facility]/GI; mass 22x64mm; invading/abutting superior mesenteric vein; 2 enlarged lymph nodes peripancreatic/porta hepatis largest 10 x 5.8 mm-nonpathologic based on EUS criteria/no biopsy]-borderline resectable.  PET scan no evidence of distant metastatic disease.  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 Fredirick Maudlin September]  # 01/02/2020-FOLFIRINOX; Neulasta. S/p FOLFIRINOX 10 cycles- April 19th whipples-  s/p neoadjuvant FOLFIRINX. #10 cycles]-s/p Whipple's [on April 19th 2022]- ypT2 [3.5cm]; pN0/11;   DIS-CONTINUED  FOLFIRINOX cycle #11 & 12-  Sec to PN-2-3.  PANCREAS (EXOCRINE), CARCINOMA: Resection  Procedure: Whipple procedure  Tumor Site: Head of pancreas.  Tumor Size: 3.5 cm, slide measurement.  Histologic Type: Pancreatic ductal adenocarcinoma.  Histologic Grade: Moderately differentiated.  Tumor Extension: Into peripancreatic connective tissue.  Treatment Effect: Moderate treatment effect.  Lymphovascular Invasion: Not identified.  Perineural Invasion: Present.  Margins: All surgical margins are negative for carcinoma.  Regional Lymph Nodes:       Number of Lymph Nodes with Tumor: 0       Number of Lymph Nodes Examined: 15  Distant Metastasis:       Distant  Site(s) Involved: Not applicable.  Pathologic Stage Classification (pTNM, AJCC 8th Edition): ypT2, ypN0   # Borderline DM; HTN   AUG 18th, 2024- PET scan- Whipple procedure with abnormal hypermetabolism centered at the pancreaticojejunostomy, worrisome for disease recurrence. Associated pancreaticojejunostomy stent in place; Hypermetabolic adjacent retroperitoneal lymph nodes, worrisome for metastatic disease. Ca 19-9 rising.    SEP 3rd, 2024- single gem [3 weeks on 1 week off]-because of neuropathy.    Cancer of head of pancreas (HCC)  12/04/2019 Initial Diagnosis   Cancer of head of pancreas (HCC)   12/19/2019 - 12/19/2019 Chemotherapy   The patient had PACLitaxel-protein bound (ABRAXANE) chemo infusion 250 mg, 125 mg/m2 = 250 mg, Intravenous,  Once, 1 of 4 cycles Administration: 250 mg (12/19/2019) gemcitabine (GEMZAR) 2,000 mg in sodium chloride 0.9 % 250 mL chemo infusion, 1,976 mg, Intravenous,  Once, 1 of 4 cycles Administration: 2,000 mg (12/19/2019)  for chemotherapy treatment.    01/02/2020 - 05/15/2020 Chemotherapy   Patient is on Treatment Plan : PANCREAS Modified FOLFIRINOX q14d x 4 cycles     11/30/2022 -  Chemotherapy   Patient is on Treatment Plan : PANCREAS Gemcitabine D1,8, (1000) q21d      HISTORY OF PRESENTING ILLNESS: Patient ambulating-independently.  With her daughter.   EVENIE BALTA 67 y.o.  female history of stage IB pancreatic adeno ca currently s/p neoadjuvant chemotherapy with FOLFIRINOX x10 cycles-status post Whipple's on July 15, 2020  is here for follow-up-review of the PET scan.  Pt states she is stressed out. Not able to eat or sleep.  Denies pain or nausea.Patient has been taking potassium pills since last visit.  Patient continues to have tingling and numbness in the extremities- currently on gabapentin not significantly better or worse.  No falls.  Denies any worsening abdominal pain.  No fever no chills.Weight  is stable.   Review of Systems   Constitutional:  Negative for chills, diaphoresis, fever and malaise/fatigue.  HENT:  Negative for nosebleeds and sore throat.   Eyes:  Negative for double vision.  Respiratory:  Negative for cough, hemoptysis, sputum production, shortness of breath and wheezing.   Cardiovascular:  Negative for chest pain, palpitations, orthopnea and leg swelling.  Gastrointestinal:  Negative for abdominal pain, blood in stool, constipation, diarrhea, heartburn, melena, nausea and vomiting.  Genitourinary:  Negative for dysuria, frequency and urgency.  Musculoskeletal:  Negative for back pain and joint pain.  Skin: Negative.  Negative for itching and rash.  Neurological:  Positive for tingling. Negative for dizziness, focal weakness, weakness and headaches.  Endo/Heme/Allergies:  Does not bruise/bleed easily.  Psychiatric/Behavioral:  Negative for depression. The patient is not nervous/anxious and does not have insomnia.      MEDICAL HISTORY:  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/04/2019   Complication of anesthesia    Fluttering heart    Hyperlipidemia    Hypertension    PONV (postoperative nausea and vomiting)    Pre-diabetes    Vertigo    none recently    SURGICAL HISTORY: Past Surgical History:  Procedure Laterality Date   CATARACT EXTRACTION W/PHACO Right 06/09/2021   Procedure: CATARACT EXTRACTION PHACO AND INTRAOCULAR LENS PLACEMENT (IOC) RIGHT malyugin;  Surgeon: Galen Manila, MD;  Location[facility];  Service: Ophthalmology;  Laterality: Right;  12.40 1:07.0   COLONOSCOPY WITH PROPOFOL N/A 01/13/2021   Procedure: COLONOSCOPY WITH PROPOFOL;  Surgeon: Wyline Mood, MD;  Location: [facility] ENDOSCOPY;  Service: Gastroenterology;  Laterality: N/A;   ENDOSCOPIC RETROGRADE CHOLANGIOPANCREATOGRAPHY (ERCP) WITH PROPOFOL N/A 11/16/2019   Procedure: ENDOSCOPIC RETROGRADE  CHOLANGIOPANCREATOGRAPHY (ERCP) WITH PROPOFOL;  Surgeon: Midge Minium, MD;  Location: [facility] ENDOSCOPY;  Service: Endoscopy;  Laterality: N/A;   ERCP N/A 03/27/2020   Procedure: ENDOSCOPIC RETROGRADE CHOLANGIOPANCREATOGRAPHY (ERCP);  Surgeon: Midge Minium, MD;  Location: [facility] ENDOSCOPY;  Service: Endoscopy;  Laterality: N/A;   EUS N/A 11/29/2019   Procedure: FULL UPPER ENDOSCOPIC ULTRASOUND (EUS) RADIAL;  Surgeon: Doren Custard, MD;  Location: [facility] ENDOSCOPY;  Service: Gastroenterology;  Laterality: N/A;   IR FLUORO RM 30-60 MIN  07/30/2020   IR IMAGING GUIDED PORT INSERTION  12/14/2019   JEJUNOSTOMY Left 07/15/2020   Procedure: Rance Muir;  Surgeon: Almond Lint, MD;  Location: [facility] OR;  Service: General;  Laterality: Left;   LAPAROSCOPY N/A 07/15/2020   Procedure: DIAGNOSTIC LAPAROSCOPY;  Surgeon: Almond Lint, MD;  Location: [facility] OR;  Service: General;  Laterality: N/A;   right knee replacement Right [id number]   SHOULDER ARTHROSCOPY W/ ROTATOR CUFF REPAIR Right    WHIPPLE PROCEDURE N/A 07/15/2020   Procedure: WHIPPLE PROCEDURE;  Surgeon: Almond Lint, MD;  Location: [facility];  Service: General;  Laterality: N/A;    SOCIAL HISTORY: Social History   Socioeconomic History   Marital status: Divorced    Spouse name: Not on file   Number of children: Not on file   Years of education: Not on file   Highest education level: Not on file  Occupational History   Not on file  Tobacco Use   Smoking status: Never   Smokeless tobacco: Never  Vaping Use   Vaping status: Never Used  Substance and Sexual Activity   Alcohol use: No    Alcohol/week: 0.0 standard drinks of alcohol  Drug use: No   Sexual activity: Never  Other Topics Concern   Not on file  Social History Narrative   ** Merged History Encounter **       Lives in [place]; with mom and son; worked in dietary; never smoked; no alcohol.    Social Determinants of Health   Financial Resource Strain: Not on file  Food Insecurity: Not  on file  Transportation Needs: Not on file  Physical Activity: Not on file  Stress: Not on file  Social Connections: Not on file  Intimate Partner Violence: Not on file    FAMILY HISTORY: Family History  Problem Relation Age of Onset   Diabetes Mother    Hyperlipidemia Mother    Hypertension Mother    Vision loss Mother    Arthritis Father    Hyperlipidemia Father    Hypertension Father    Breast cancer Maternal Aunt     ALLERGIES:  has No Known Allergies.  MEDICATIONS:  Current Outpatient Medications  Medication Sig Dispense Refill   acetaminophen (TYLENOL) 325 MG tablet Take 650 mg by mouth every 6 (six) hours as needed for moderate pain.     amLODipine (NORVASC) 10 MG tablet Take 1 tablet (10 mg total) by mouth daily. 30 tablet 0   Calcium Carbonate-Vit D-Min (CALCIUM 600+D PLUS MINERALS PO) Take 1 tablet by mouth in the morning and at bedtime.     gabapentin (NEURONTIN) 300 MG capsule Take 300 mg by mouth daily.     lidocaine-prilocaine (EMLA) cream Apply on the port. 30 -45 min  prior to port access. 30 g 3   lisinopril (ZESTRIL) 10 MG tablet Take 10 mg by mouth daily.     metoprolol succinate (TOPROL-XL) 50 MG 24 hr tablet Take 1 tablet (50 mg total) by mouth daily. Take with or immediately following a meal. 30 tablet 0   ondansetron (ZOFRAN) 8 MG tablet One pill every 8 hours as needed for nausea/vomitting. 40 tablet 1   potassium chloride SA (KLOR-CON M) 20 MEQ tablet Take 1 tablet (20 mEq total) by mouth 2 (two) times daily. 60 tablet 1   prochlorperazine (COMPAZINE) 10 MG tablet Take 1 tablet (10 mg total) by mouth every 6 (six) hours as needed for nausea or vomiting. 40 tablet 1   vitamin B-12 (CYANOCOBALAMIN) 1000 MCG tablet Take 1 tablet (1,000 mcg total) by mouth daily.     No current facility-administered medications for this visit.   Facility-Administered Medications Ordered in Other Visits  Medication Dose Route Frequency Provider Last Rate Last Admin    sodium chloride flush (NS) 0.9 % injection 10 mL  10 mL Intravenous PRN Louretta Shorten R, MD   10 mL at 06/23/21 1012      PHYSICAL EXAMINATION: ECOG PERFORMANCE STATUS: 0 - Asymptomatic  Vitals:   11/23/22 0826  BP: (!) 151/79  Pulse: 96  Temp: (!) 96.2 F (35.7 C)  SpO2: 100%    Filed Weights   11/23/22 0826  Weight: 179 lb (81.2 kg)   Physical Exam Vitals reviewed.  Constitutional:      Appearance: She is not ill-appearing.  HENT:     Head: Normocephalic and atraumatic.     Mouth/Throat:     Pharynx: No oropharyngeal exudate.  Eyes:     General: No scleral icterus. Cardiovascular:     Rate and Rhythm: Normal rate and regular rhythm.  Pulmonary:     Effort: Pulmonary effort is normal. No respiratory distress.     Breath  sounds: No wheezing.  Abdominal:     General: There is no distension.     Palpations: Abdomen is soft.     Tenderness: There is no abdominal tenderness. There is no guarding.  Musculoskeletal:        General: No tenderness or deformity.  Lymphadenopathy:     Cervical: No cervical adenopathy.  Skin:    General: Skin is warm.     Coloration: Skin is not pale.  Neurological:     Mental Status: She is alert and oriented to person, place, and time.  Psychiatric:        Mood and Affect: Mood and affect normal.        Behavior: Behavior normal.    LABORATORY DATA:  I have reviewed the data as listed Lab Results  Component Value Date   WBC 4.8 10/29/2022   HGB 10.3 (L) 10/29/2022   HCT 31.9 (L) 10/29/2022   MCV 73.0 (L) 10/29/2022   PLT 330 10/29/2022   Recent Labs    06/25/22 1025 09/22/22 1016 10/29/22 0944 11/17/22 1312  NA 138 137 137 136  K 3.0* 3.2* 2.8* 3.3*  CL 102 103 102 105  CO2 26 26 25 23   GLUCOSE 110* 107* 164* 127*  BUN 10 10 16 10   CREATININE 0.73 0.79 0.91 0.80  CALCIUM 8.9 8.6* 8.8* 8.9  GFRNONAA >60 >60 >60 >60  PROT 7.8 7.2 7.7  --   ALBUMIN 3.6 3.6 3.7  --   AST 20 15 19   --   ALT 12 10 12   --    ALKPHOS 95 78 83  --   BILITOT 0.6 0.2* 0.4  --    Component Ref Range & Units 1 mo ago (06/25/22) 4 mo ago (03/26/22) 6 mo ago (01/22/22) 8 mo ago (11/23/21) 1 yr ago (06/23/21) 1 yr ago (04/20/21) 1 yr ago (01/23/21)  CA 19-9 0 - 35 U/mL 24 21 CM 17 CM 12 CM 14 CM 13 CM 16 CM     RADIOGRAPHIC STUDIES: I have personally reviewed the radiological images as listed and agreed with the findings in the report. NM PET Image Restage (PS) Skull Base to Thigh (F-18 FDG)  Result Date: 11/15/2022 CLINICAL DATA:  Subsequent treatment strategy for pancreatic cancer with rising tumor markers. EXAM: NUCLEAR MEDICINE PET SKULL BASE TO THIGH TECHNIQUE: 10.1 mCi F-18 FDG was injected intravenously. Full-ring PET imaging was performed from the skull base to thigh after the radiotracer. CT data was obtained and used for attenuation correction and anatomic localization. Fasting blood glucose: 113 mg/dl COMPARISON:  CT chest abdomen pelvis 10/25/2022 and PET 11/28/2019. FINDINGS: Mediastinal blood pool activity: SUV max 3.0 Liver activity: SUV max NA NECK: No abnormal hypermetabolism. Incidental CT findings: None. CHEST: Possible focal hypermetabolism along the medial aspect of left thyroid, SUV max 3.7, without a definitive CT correlate. No additional abnormal hypermetabolism. Incidental CT findings: Right IJ Port-A-Cath terminates in the low SVC. Atherosclerotic calcification of the aorta and coronary arteries. Heart is mildly enlarged. No pericardial or pleural effusion. 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 ABDOMEN/PELVIS: Abnormal hypermetabolism associated with the pancreaticojejunostomy is centered along the internally in stent, SUV max 7.6 (4/80). Lack of IV contrast limits CT comparison. Hypermetabolic aortocaval lymph node measures approximately 8 mm (4/79), SUV max 3.7. Borderline hypermetabolic left periaortic lymph node measures 6 mm (4/81), SUV max 3.1. No additional abnormal  hypermetabolism. Incidental CT findings: Pneumobilia. Liver is otherwise grossly unremarkable. Cholecystectomy. Adrenal glands, kidneys and spleen  are grossly unremarkable. Pancreaticoduodenectomy, pancreaticojejunostomy and gastrojejunostomy. Stomach, small bowel and colon are otherwise grossly unremarkable. SKELETON: No abnormal hypermetabolism. Incidental CT findings: Degenerative changes in the spine. IMPRESSION: 1. Whipple procedure with abnormal hypermetabolism centered at the pancreaticojejunostomy, worrisome for disease recurrence. Associated pancreaticojejunostomy stent in place. 2. Hypermetabolic adjacent retroperitoneal lymph nodes, worrisome for metastatic disease. 3. Focal hypermetabolism along the medial aspect of the left thyroid. Recommend thyroid ultrasound as malignancy cannot be excluded. 4. Aortic atherosclerosis (ICD10-I70.0). Coronary artery calcification. Electronically Signed   By: Leanna Battles M.D.   On: 11/15/2022 11:24   CT CHEST ABDOMEN PELVIS W CONTRAST  Result Date: 10/27/2022 CLINICAL DATA:  Pancreatic cancer restaging, status post Whipple procedure * Tracking Code: BO * EXAM: CT CHEST, ABDOMEN, AND PELVIS WITH CONTRAST TECHNIQUE: Multidetector CT imaging of the chest, abdomen and pelvis was performed following the standard protocol during bolus administration of intravenous contrast. RADIATION DOSE REDUCTION: This exam was performed according to the departmental dose-optimization program which includes automated exposure control, adjustment of the mA and/or kV according to patient size and/or use of iterative reconstruction technique. CONTRAST:  OMNIPAQUE IOHEXOL 300 MG/ML  SOLN COMPARISON:  CT abdomen pelvis, 07/19/2022, CT chest, 04/23/2022 FINDINGS: CT CHEST FINDINGS Cardiovascular: Right chest port catheter. Aortic atherosclerosis. Normal heart size. Left coronary artery calcifications. No pericardial effusion. Mediastinum/Nodes: No enlarged mediastinal, hilar, or  axillary lymph nodes. Thyroid gland, trachea, and esophagus demonstrate no significant findings. Lungs/Pleura: Minimal centrilobular emphysema. Background of fine ground-glass centrilobular nodules, most concentrated in the lung apices. No pleural effusion or pneumothorax. 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 Musculoskeletal: No chest wall abnormality. No acute osseous findings. CT ABDOMEN PELVIS FINDINGS Hepatobiliary: No focal liver abnormality is seen. Status post cholecystectomy and hepatojejunostomy. Unchanged mild postoperative biliary ductal dilatation and pneumobilia. Pancreas: Status post Whipple pancreaticoduodenectomy with pancreaticojejunostomy. Pancreatic duct stent remains in unchanged position (series 2, image 61). No pancreatic ductal dilatation or surrounding inflammatory changes. Spleen: Normal in size without significant abnormality. Adrenals/Urinary Tract: Adrenal glands are unremarkable. Kidneys are normal, without renal calculi, solid lesion, or hydronephrosis. Bladder is unremarkable. Stomach/Bowel: Status post distal gastrectomy and gastrojejunostomy. Appendix appears normal. No evidence of bowel wall thickening, distention, or inflammatory changes. Descending and sigmoid diverticulosis. Vascular/Lymphatic: Scattered aortic atherosclerosis. No enlarged abdominal or pelvic lymph nodes. Reproductive: No mass or other abnormality. Other: Small midline ventral hernia containing nonobstructed loops of small bowel (series 2, image 77) no ascites. Musculoskeletal: No acute osseous findings. IMPRESSION: 1. Status post Whipple pancreaticoduodenectomy with pancreaticojejunostomy and other associated postoperative findings. Pancreatic duct stent remains in unchanged position. No pancreatic ductal dilatation. 2. No evidence of lymphadenopathy or metastatic disease in the chest, abdomen, or pelvis.  3. Coronary artery disease. Aortic Atherosclerosis (ICD10-I70.0) and Emphysema (ICD10-J43.9). Electronically Signed   By: Jearld Lesch M.D.   On: 10/27/2022 08:14    ASSESSMENT & PLAN:   Cancer of head of pancreas (HCC) # AUG 2024- STAGE IV/METASTATIC- Recurrent Pancreas adenocarcinoma [2021-Stage IB- s/p neoadjuvant FOLFIRINX. #10 cycles]-s/p Whipple's [on April 19th 2022]- ypT2ypN0.  AUG 18th, 2024- PET scan- Whipple procedure with abnormal hypermetabolism centered at the pancreaticojejunostomy, worrisome for disease recurrence. Associated pancreaticojejunostomy stent in place; Hypermetabolic adjacent retroperitoneal lymph nodes, worrisome for metastatic disease. Ca 19-9 rising.  NGS-tempus- pending.   # Reviewed with the patient regarding concerns for recurrent pancreatic cancer-given the imaging findings and also rising CA 19-9. Will review imaging at tumor conference.  #  As reviewed with patient and daughter that unfortunately stage-4 he is incurable cancer.  However palliative chemotherapy with recommended follow-up controlling the disease/also help live longer.  Discussed that the median survival is around 1 year.  To the patient's question-regarding life expectancy without chemo-my best guess was about 6 months.  I discussed that these numbers do not belong to the patient; and median survival only.  # Given the ongoing neuropathy-limited disease noted on imaging and start the patient gemcitabine chemotherapy alone.  Recommend gemcitabine weekly x 2 every 3 weeks. #  I reviewed the chemotherapy; and the schedule in detail.  I also discussed the potential side effects including but not limited to-increasing fatigue, nausea vomiting, diarrhea, hair loss, sores in the mouth, increase risk of infection.  Also reviewed the multiple strategies to avoid/mitigate similar side effects including preemptive medications-for nausea vomiting.  Also discussed at length regarding good oral hygiene/hydration and  in general precautions regarding duration of infections.  Chemotherapy related side effects are usually minimal.   # Incidental PET scan- AUG 2024- Focal hypermetabolism along the medial aspect of the left thyroid. Recommend thyroid ultrasound as malignancy cannot be excluded.  Hold off any further workup given patient's pancreatic cancer.  # Anemia- microcytic- Stable 9-10; NO IDA- ;  colo [EUS- 2021; oct 2022- colo; Dr.Anna]; stable.   # Severe hypokalemia potassium 2.8-.  Potassium 3.4.  Continue K-Dur 20 mEq twice daily. Stable.    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  Poorly controlled.  Recommend evaluation with neurology.  # Hypertension-poorly controlled 180s]; Continue Norvasc/Metoprlol.[ per pt At home- 130/70s ;AGAIN RE-ITERATED to bring log. stable.  # mediport:  continue port flushes q 2-50M.stable.   # Anxiety/depression/insomnia: Likely secondary to recurrent disease diagnosis.  Recommend evaluation with Dr. Windy Fast for consideration of antidepressants.  # DISPOSITION:  # referral to Dr.vaslow re: peripheral neuropathy # referral to Josh/ re: anxiety/ and depression # follow up in 1 week- MD; labs-port-cbc/cmp; Ca 19-9- gem chemo # follow up in 2 week-  labs-port-cbc/cmp; gem chemo -Dr.B  # I reviewed the blood work- with the patient in detail; also reviewed the imaging independently [as summarized above]; and with the patient in detail.   # 40 minutes face-to-face with the patient discussing the above plan of care; more than 50% of time spent on prognosis/ natural history; counseling and coordination.   Earna Coder, MD 11/23/2022

## 2022-11-23 NOTE — Assessment & Plan Note (Addendum)
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 #10 cycles]-s/p Whipple's [on April 19th 2022]- ypT2ypN0.  AUG 18th, 2024- PET scan- Whipple procedure with abnormal hypermetabolism centered at the pancreaticojejunostomy, worrisome for disease recurrence. Associated pancreaticojejunostomy stent in place; Hypermetabolic adjacent retroperitoneal lymph nodes, worrisome for metastatic disease. Ca 19-9 rising.  NGS-tempus- pending.   # Reviewed with the patient regarding concerns for recurrent pancreatic cancer-given the imaging findings and also rising CA 19-9. Will review imaging at tumor conference.  # As reviewed with patient and daughter that unfortunately stage-4 he is incurable cancer.  However palliative chemotherapy with recommended follow-up controlling the disease/also help live longer.  Discussed that the median survival is around 1 year.  To the patient's question-regarding life expectancy without chemo-my best guess was about 6 months.  I discussed that these numbers do not belong to the patient; and median survival only.  # Given the ongoing neuropathy-limited disease noted on imaging and start the patient gemcitabine chemotherapy alone.  Recommend gemcitabine weekly x 2 every 3 weeks. #  I reviewed the chemotherapy; and the schedule in detail.  I also discussed the potential side effects including but not limited to-increasing fatigue, nausea vomiting, diarrhea, hair loss, sores in the mouth, increase risk of infection.  Also reviewed the multiple strategies to avoid/mitigate similar side effects including preemptive medications-for nausea vomiting.  Also discussed at length regarding good oral hygiene/hydration and in general precautions regarding duration of infections.  Chemotherapy related side effects are usually minimal.   # Incidental PET scan- AUG 2024- Focal hypermetabolism along the medial aspect of the left thyroid. Recommend  thyroid ultrasound as malignancy cannot be excluded.  Hold off any further workup given patient's pancreatic cancer.  # Anemia- microcytic- Stable 9-10; NO IDA- ;  colo [EUS- 2021; oct 2022- colo; Dr.Anna]; stable.   # Severe hypokalemia potassium 2.8-.  Potassium 3.4.  Continue K-Dur 20 mEq twice daily. Stable.    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  Poorly controlled.  Recommend evaluation with neurology.  # Hypertension-poorly controlled 180s]; Continue Norvasc/Metoprlol.[ per pt At home- 130/70s ;AGAIN RE-ITERATED to bring log. stable.  # mediport:  continue port flushes q 2-12M.stable.   # Anxiety/depression/insomnia: Likely secondary to recurrent disease diagnosis.  Recommend evaluation with Dr. Windy Fast for consideration of antidepressants.  # DISPOSITION:  # referral to Dr.vaslow re: peripheral neuropathy # referral to Josh/ re: anxiety/ and depression # follow up in 1 week- MD; labs-port-cbc/cmp; Ca 19-9- gem chemo # follow up in 2 week-  labs-port-cbc/cmp; gem chemo -Dr.B  # I reviewed the blood work- with the patient in detail; also reviewed the imaging independently [as summarized above]; and with the patient in detail.   # 40 minutes face-to-face with the patient discussing the above plan of care; more than 50% of time spent on prognosis/ natural history; counseling and coordination.

## 2022-11-24 ENCOUNTER — Other Ambulatory Visit: Payer: Self-pay

## 2022-11-25 ENCOUNTER — Other Ambulatory Visit: Payer: 59

## 2022-11-28 ENCOUNTER — Other Ambulatory Visit: Payer: Self-pay

## 2022-11-30 ENCOUNTER — Encounter: Payer: Self-pay | Admitting: Internal Medicine

## 2022-11-30 ENCOUNTER — Inpatient Hospital Stay: Payer: 59 | Attending: Internal Medicine

## 2022-11-30 ENCOUNTER — Inpatient Hospital Stay (HOSPITAL_BASED_OUTPATIENT_CLINIC_OR_DEPARTMENT_OTHER): Payer: 59 | Admitting: Hospice and Palliative Medicine

## 2022-11-30 ENCOUNTER — Inpatient Hospital Stay (HOSPITAL_BASED_OUTPATIENT_CLINIC_OR_DEPARTMENT_OTHER): Payer: 59 | Admitting: Internal Medicine

## 2022-11-30 ENCOUNTER — Inpatient Hospital Stay: Payer: 59

## 2022-11-30 VITALS — BP 181/92 | HR 99 | Temp 96.2°F | Ht 64.0 in | Wt 179.0 lb

## 2022-11-30 DIAGNOSIS — R630 Anorexia: Secondary | ICD-10-CM | POA: Diagnosis not present

## 2022-11-30 DIAGNOSIS — Z79899 Other long term (current) drug therapy: Secondary | ICD-10-CM | POA: Diagnosis not present

## 2022-11-30 DIAGNOSIS — G629 Polyneuropathy, unspecified: Secondary | ICD-10-CM | POA: Insufficient documentation

## 2022-11-30 DIAGNOSIS — D509 Iron deficiency anemia, unspecified: Secondary | ICD-10-CM | POA: Insufficient documentation

## 2022-11-30 DIAGNOSIS — I10 Essential (primary) hypertension: Secondary | ICD-10-CM | POA: Diagnosis not present

## 2022-11-30 DIAGNOSIS — I700 Atherosclerosis of aorta: Secondary | ICD-10-CM | POA: Diagnosis not present

## 2022-11-30 DIAGNOSIS — F419 Anxiety disorder, unspecified: Secondary | ICD-10-CM

## 2022-11-30 DIAGNOSIS — Z5111 Encounter for antineoplastic chemotherapy: Secondary | ICD-10-CM | POA: Diagnosis present

## 2022-11-30 DIAGNOSIS — C250 Malignant neoplasm of head of pancreas: Secondary | ICD-10-CM

## 2022-11-30 DIAGNOSIS — E876 Hypokalemia: Secondary | ICD-10-CM | POA: Diagnosis not present

## 2022-11-30 DIAGNOSIS — Z515 Encounter for palliative care: Secondary | ICD-10-CM | POA: Diagnosis not present

## 2022-11-30 DIAGNOSIS — F32A Depression, unspecified: Secondary | ICD-10-CM | POA: Insufficient documentation

## 2022-11-30 DIAGNOSIS — M1990 Unspecified osteoarthritis, unspecified site: Secondary | ICD-10-CM | POA: Diagnosis not present

## 2022-11-30 DIAGNOSIS — E559 Vitamin D deficiency, unspecified: Secondary | ICD-10-CM | POA: Diagnosis not present

## 2022-11-30 LAB — CMP (CANCER CENTER ONLY)
ALT: 10 U/L (ref 0–44)
AST: 15 U/L (ref 15–41)
Albumin: 3.8 g/dL (ref 3.5–5.0)
Alkaline Phosphatase: 73 U/L (ref 38–126)
Anion gap: 11 (ref 5–15)
BUN: 9 mg/dL (ref 8–23)
CO2: 24 mmol/L (ref 22–32)
Calcium: 8.8 mg/dL — ABNORMAL LOW (ref 8.9–10.3)
Chloride: 101 mmol/L (ref 98–111)
Creatinine: 0.91 mg/dL (ref 0.44–1.00)
GFR, Estimated: >60 mL/min (ref >60)
Glucose, Bld: 116 mg/dL — ABNORMAL HIGH (ref 70–99)
Potassium: 3.3 mmol/L — ABNORMAL LOW (ref 3.5–5.1)
Sodium: 136 mmol/L (ref 135–145)
Total Bilirubin: 0.6 mg/dL (ref 0.3–1.2)
Total Protein: 7.3 g/dL (ref 6.5–8.1)

## 2022-11-30 LAB — CBC WITH DIFFERENTIAL (CANCER CENTER ONLY)
Abs Immature Granulocytes: 0.01 10*3/uL (ref 0.00–0.07)
Basophils Absolute: 0 10*3/uL (ref 0.0–0.1)
Basophils Relative: 0 %
Eosinophils Absolute: 0 10*3/uL (ref 0.0–0.5)
Eosinophils Relative: 0 %
HCT: 32.6 % — ABNORMAL LOW (ref 36.0–46.0)
Hemoglobin: 10.1 g/dL — ABNORMAL LOW (ref 12.0–15.0)
Immature Granulocytes: 0 %
Lymphocytes Relative: 29 %
Lymphs Abs: 1.5 10*3/uL (ref 0.7–4.0)
MCH: 23.1 pg — ABNORMAL LOW (ref 26.0–34.0)
MCHC: 31 g/dL (ref 30.0–36.0)
MCV: 74.4 fL — ABNORMAL LOW (ref 80.0–100.0)
Monocytes Absolute: 0.4 10*3/uL (ref 0.1–1.0)
Monocytes Relative: 8 %
Neutro Abs: 3.2 10*3/uL (ref 1.7–7.7)
Neutrophils Relative %: 63 %
Platelet Count: 271 10*3/uL (ref 150–400)
RBC: 4.38 MIL/uL (ref 3.87–5.11)
RDW: 15.9 % — ABNORMAL HIGH (ref 11.5–15.5)
WBC Count: 5.1 10*3/uL (ref 4.0–10.5)
nRBC: 0 % (ref 0.0–0.2)

## 2022-11-30 MED ORDER — DULOXETINE HCL 30 MG PO CPEP: 30.0000 mg | ORAL_CAPSULE | Freq: Every day | ORAL | 3 refills | Status: DC

## 2022-11-30 MED ORDER — HEPARIN SOD (PORK) LOCK FLUSH 100 UNIT/ML IV SOLN
500.0000 [IU] | Freq: Once | INTRAVENOUS | Status: DC | PRN
  Filled 2022-11-30: qty 5

## 2022-11-30 MED ORDER — PROCHLORPERAZINE MALEATE 10 MG PO TABS
10.0000 mg | ORAL_TABLET | Freq: Once | ORAL | Status: AC
  Administered 2022-11-30: 10 mg via ORAL
  Filled 2022-11-30: qty 1

## 2022-11-30 MED ORDER — SODIUM CHLORIDE 0.9 % IV SOLN
1000.0000 mg/m2 | Freq: Once | INTRAVENOUS | Status: AC
  Administered 2022-11-30: 1900 mg via INTRAVENOUS
  Filled 2022-11-30: qty 49.97

## 2022-11-30 MED ORDER — SODIUM CHLORIDE 0.9 % IV SOLN
Freq: Once | INTRAVENOUS | Status: AC
  Filled 2022-11-30: qty 250

## 2022-11-30 NOTE — Patient Instructions (Signed)
[facility]  Discharge Instructions: Thank you for choosing [facility] to provide your oncology and hematology care.  If you have a lab appointment with the Cancer Center, please go directly to the Cancer Center and check in at the registration area.  Wear comfortable clothing and clothing appropriate for easy access to any Portacath or PICC line.   We strive to give you quality time with your provider. You may need to reschedule your appointment if you arrive late (15 or more minutes).  Arriving late affects you and other patients whose appointments are after yours.  Also, if you miss three or more appointments without notifying the office, you may be dismissed from the clinic at the provider's discretion.      For prescription refill requests, have your pharmacy contact our office and allow 72 hours for refills to be completed.    Today you received the following chemotherapy and/or immunotherapy agents Gemzar       To help prevent nausea and vomiting after your treatment, we encourage you to take your nausea medication as directed.  BELOW ARE SYMPTOMS THAT SHOULD BE REPORTED IMMEDIATELY: *FEVER GREATER THAN 100.4 F (38 C) OR HIGHER *CHILLS OR SWEATING *NAUSEA AND VOMITING THAT IS NOT CONTROLLED WITH YOUR NAUSEA MEDICATION *UNUSUAL SHORTNESS OF BREATH *UNUSUAL BRUISING OR BLEEDING *URINARY PROBLEMS (pain or burning when urinating, or frequent urination) *BOWEL PROBLEMS (unusual diarrhea, constipation, pain near the anus) TENDERNESS IN MOUTH AND THROAT WITH OR WITHOUT PRESENCE OF ULCERS (sore throat, sores in mouth, or a toothache) UNUSUAL RASH, SWELLING OR PAIN  UNUSUAL VAGINAL DISCHARGE OR ITCHING   Items with * indicate a potential emergency and should be followed up as soon as possible or go to the Emergency Department if any problems should occur.  Please show the CHEMOTHERAPY ALERT CARD or IMMUNOTHERAPY ALERT CARD at check-in to  the Emergency Department and triage nurse.  Should you have questions after your visit or need to cancel or reschedule your appointment, please contact [facility]  [phone] and follow the prompts.  Office hours are 8:00 a.m. to 4:30 p.m. Monday - Friday. Please note that voicemails left after 4:00 p.m. may not be returned until the following business day.  We are closed weekends and major holidays. You have access to a nurse at all times for urgent questions. Please call the main number to the clinic [phone] and follow the prompts.  For any non-urgent questions, you may also contact your provider using MyChart. We now offer e-Visits for anyone 18 and older to request care online for non-urgent symptoms. For details visit [email or web address].   Also download the MyChart app! Go to the app store, search "MyChart", open the app, select [facility], and log in with your MyChart username and password.    

## 2022-11-30 NOTE — Progress Notes (Signed)
[facility] CONSULT NOTE  Patient Care Team: Entzminger, Danton Clap, MD as PCP - General (Internal Medicine) Toy Cookey, FNP (Family Medicine) Jim Like, RN as Registered Nurse Scarlett Presto, RN (Inactive) as Registered Nurse Benita Gutter, RN as Oncology Nurse Navigator Almond Lint, MD as Consulting Physician (General Surgery) Earna Coder, MD as Consulting Physician (Internal Medicine)  CHIEF COMPLAINTS/PURPOSE OF CONSULTATION: pancreas adenocarcinoma  Oncology History Overview Note  #Pancreas adenocarcinoma-Stage IB- [id number]-[EUS- Dr.Spaete; [facility]/GI; mass 22x58mm; invading/abutting superior mesenteric vein; 2 enlarged lymph nodes peripancreatic/porta hepatis largest 10 x 5.8 mm-nonpathologic based on EUS criteria/no biopsy]-borderline resectable.  PET scan no evidence of distant metastatic disease.  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 Fredirick Maudlin September]  # 01/02/2020-FOLFIRINOX; Neulasta. S/p FOLFIRINOX 10 cycles- April 19th whipples-  s/p neoadjuvant FOLFIRINX. #10 cycles]-s/p Whipple's [on April 19th 2022]- ypT2 [3.5cm]; pN0/11;   DIS-CONTINUED  FOLFIRINOX cycle #11 & 12-  Sec to PN-2-3.  PANCREAS (EXOCRINE), CARCINOMA: Resection  Procedure: Whipple procedure  Tumor Site: Head of pancreas.  Tumor Size: 3.5 cm, slide measurement.  Histologic Type: Pancreatic ductal adenocarcinoma.  Histologic Grade: Moderately differentiated.  Tumor Extension: Into peripancreatic connective tissue.  Treatment Effect: Moderate treatment effect.  Lymphovascular Invasion: Not identified.  Perineural Invasion: Present.  Margins: All surgical margins are negative for carcinoma.  Regional Lymph Nodes:       Number of Lymph Nodes with Tumor: 0       Number of Lymph Nodes Examined: 15  Distant Metastasis:       Distant  Site(s) Involved: Not applicable.  Pathologic Stage Classification (pTNM, AJCC 8th Edition): ypT2, ypN0   # Borderline DM; HTN   AUG 18th, 2024- PET scan- Whipple procedure with abnormal hypermetabolism centered at the pancreaticojejunostomy, worrisome for disease recurrence. Associated pancreaticojejunostomy stent in place; Hypermetabolic adjacent retroperitoneal lymph nodes, worrisome for metastatic disease. Ca 19-9 rising.    SEP 3rd, 2024- single gem [3 weeks on 1 week off]-because of neuropathy.    Cancer of head of pancreas (HCC)  12/04/2019 Initial Diagnosis   Cancer of head of pancreas (HCC)   12/19/2019 - 12/19/2019 Chemotherapy   The patient had PACLitaxel-protein bound (ABRAXANE) chemo infusion 250 mg, 125 mg/m2 = 250 mg, Intravenous,  Once, 1 of 4 cycles Administration: 250 mg (12/19/2019) gemcitabine (GEMZAR) 2,000 mg in sodium chloride 0.9 % 250 mL chemo infusion, 1,976 mg, Intravenous,  Once, 1 of 4 cycles Administration: 2,000 mg (12/19/2019)  for chemotherapy treatment.    01/02/2020 - 05/15/2020 Chemotherapy   Patient is on Treatment Plan : PANCREAS Modified FOLFIRINOX q14d x 4 cycles     11/30/2022 -  Chemotherapy   Patient is on Treatment Plan : PANCREAS Gemcitabine D1,8, (1000) q21d      HISTORY OF PRESENTING ILLNESS: Patient ambulating-independently.  With her daughter.   [facility] 67 y.o.  female history recurrent stage IV  pancreatic adeno ca  is here for follow-up- and proceed with chemo therapy.   Pt states she is stressed out. Not able to eat or sleep.  Denies pain or nausea.Patient has been taking potassium pills since last visit.  Patient continues to have tingling and numbness in the extremities- currently on gabapentin not significantly better or worse.  No falls.  Denies any worsening abdominal pain.  No fever no chills.Weight is stable.   Review of Systems  Constitutional:  Negative  for chills, diaphoresis, fever and malaise/fatigue.  HENT:   Negative for nosebleeds and sore throat.   Eyes:  Negative for double vision.  Respiratory:  Negative for cough, hemoptysis, sputum production, shortness of breath and wheezing.   Cardiovascular:  Negative for chest pain, palpitations, orthopnea and leg swelling.  Gastrointestinal:  Negative for abdominal pain, blood in stool, constipation, diarrhea, heartburn, melena, nausea and vomiting.  Genitourinary:  Negative for dysuria, frequency and urgency.  Musculoskeletal:  Negative for back pain and joint pain.  Skin: Negative.  Negative for itching and rash.  Neurological:  Positive for tingling. Negative for dizziness, focal weakness, weakness and headaches.  Endo/Heme/Allergies:  Does not bruise/bleed easily.  Psychiatric/Behavioral:  Negative for depression. The patient is not nervous/anxious and does not have insomnia.      MEDICAL HISTORY:  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/04/2019   Complication of anesthesia    Fluttering heart    Hyperlipidemia    Hypertension    PONV (postoperative nausea and vomiting)    Pre-diabetes    Vertigo    none recently    SURGICAL HISTORY: Past Surgical History:  Procedure Laterality Date   CATARACT EXTRACTION W/PHACO Right 06/09/2021   Procedure: CATARACT EXTRACTION PHACO AND INTRAOCULAR LENS PLACEMENT (IOC) RIGHT malyugin;  Surgeon: Galen Manila, MD;  Location: [facility];  Service: Ophthalmology;  Laterality: Right;  12.40 1:07.0   COLONOSCOPY WITH PROPOFOL N/A 01/13/2021   Procedure: COLONOSCOPY WITH PROPOFOL;  Surgeon: Wyline Mood, MD;  Location: [facility];  Service: Gastroenterology;  Laterality: N/A;   ENDOSCOPIC RETROGRADE CHOLANGIOPANCREATOGRAPHY (ERCP) WITH PROPOFOL N/A 11/16/2019   Procedure: ENDOSCOPIC RETROGRADE CHOLANGIOPANCREATOGRAPHY (ERCP) WITH PROPOFOL;  Surgeon: Midge Minium, MD;  Location: [facility];   Service: Endoscopy;  Laterality: N/A;   ERCP N/A 03/27/2020   Procedure: ENDOSCOPIC RETROGRADE CHOLANGIOPANCREATOGRAPHY (ERCP);  Surgeon: Midge Minium, MD;  Location: [facility];  Service: Endoscopy;  Laterality: N/A;   EUS N/A 11/29/2019   Procedure: FULL UPPER ENDOSCOPIC ULTRASOUND (EUS) RADIAL;  Surgeon: Doren Custard, MD;  Location: [facility];  Service: Gastroenterology;  Laterality: N/A;   IR FLUORO RM 30-60 MIN  07/30/2020   IR IMAGING GUIDED PORT INSERTION  12/14/2019   JEJUNOSTOMY Left 07/15/2020   Procedure: Rance Muir;  Surgeon: Almond Lint, MD;  Location: [facility];  Service: General;  Laterality: Left;   LAPAROSCOPY N/A 07/15/2020   Procedure: DIAGNOSTIC LAPAROSCOPY;  Surgeon: Almond Lint, MD;  Location: [facility] OR;  Service: General;  Laterality: N/A;   right knee replacement Right [id number]   SHOULDER ARTHROSCOPY W/ ROTATOR CUFF REPAIR Right    WHIPPLE PROCEDURE N/A 07/15/2020   Procedure: WHIPPLE PROCEDURE;  Surgeon: Almond Lint, MD;  Location: [facility];  Service: General;  Laterality: N/A;    SOCIAL HISTORY: Social History   Socioeconomic History   Marital status: Divorced    Spouse name: Not on file   Number of children: Not on file   Years of education: Not on file   Highest education level: Not on file  Occupational History   Not on file  Tobacco Use   Smoking status: Never   Smokeless tobacco: Never  Vaping Use   Vaping status: Never Used  Substance and Sexual Activity   Alcohol use: No    Alcohol/week: 0.0 standard drinks of alcohol   Drug use: No   Sexual activity: Never  Other Topics Concern   Not on file  Social History Narrative   ** Merged History Encounter **       Lives in [place]; with mom and son; worked in dietary; never smoked; no alcohol.    Social Determinants of Health   Financial Resource Strain: Not on file  Food Insecurity: Not on file  Transportation Needs: Not on file  Physical Activity: Not on file  Stress: Not on file   Social Connections: Not on file  Intimate Partner Violence: Not on file    FAMILY HISTORY: Family History  Problem Relation Age of Onset   Diabetes Mother    Hyperlipidemia Mother    Hypertension Mother    Vision loss Mother    Arthritis Father    Hyperlipidemia Father    Hypertension Father    Breast cancer Maternal Aunt     ALLERGIES:  has No Known Allergies.  MEDICATIONS:  Current Outpatient Medications  Medication Sig Dispense Refill   acetaminophen (TYLENOL) 325 MG tablet Take 650 mg by mouth every 6 (six) hours as needed for moderate pain.     amLODipine (NORVASC) 10 MG tablet Take 1 tablet (10 mg total) by mouth daily. 30 tablet 0   Calcium Carbonate-Vit D-Min (CALCIUM 600+D PLUS MINERALS PO) Take 1 tablet by mouth in the morning and at bedtime.     gabapentin (NEURONTIN) 300 MG capsule Take 300 mg by mouth daily.     lidocaine-prilocaine (EMLA) cream Apply on the port. 30 -45 min  prior to port access. 30 g 3   lisinopril (ZESTRIL) 10 MG tablet Take 10 mg by mouth daily.     metoprolol succinate (TOPROL-XL) 50 MG 24 hr tablet Take 1 tablet (50 mg total) by mouth daily. Take with or immediately following a meal. 30 tablet 0   ondansetron (ZOFRAN) 8 MG tablet One pill every 8 hours as needed for nausea/vomitting. 40 tablet 1   potassium chloride SA (KLOR-CON M) 20 MEQ tablet Take 1 tablet (20 mEq total) by mouth 2 (two) times daily. 60 tablet 1   prochlorperazine (COMPAZINE) 10 MG tablet Take 1 tablet (10 mg total) by mouth every 6 (six) hours as needed for nausea or vomiting. 40 tablet 1   vitamin B-12 (CYANOCOBALAMIN) 1000 MCG tablet Take 1 tablet (1,000 mcg total) by mouth daily.     No current facility-administered medications for this visit.   Facility-Administered Medications Ordered in Other Visits  Medication Dose Route Frequency Provider Last Rate Last Admin   gemcitabine (GEMZAR) 1,900 mg in sodium chloride 0.9 % 250 mL chemo infusion  1,000 mg/m2 (Treatment  Plan Recorded) Intravenous Once Louretta Shorten R, MD       heparin lock flush 100 unit/mL  500 Units Intracatheter Once PRN Earna Coder, MD       sodium chloride flush (NS) 0.9 % injection 10 mL  10 mL Intravenous PRN Earna Coder, MD   10 mL at 06/23/21 1012      PHYSICAL EXAMINATION: ECOG PERFORMANCE STATUS: 0 - Asymptomatic  Vitals:   11/30/22 1252  BP: (!) 181/92  Pulse: 99  Temp: (!) 96.2 F (35.7 C)  SpO2: 99%     Filed Weights   11/30/22 1252  Weight: 179 lb (81.2 kg)    Physical Exam Vitals reviewed.  Constitutional:      Appearance: She is not ill-appearing.  HENT:     Head: Normocephalic and atraumatic.     Mouth/Throat:     Pharynx:  No oropharyngeal exudate.  Eyes:     General: No scleral icterus. Cardiovascular:     Rate and Rhythm: Normal rate and regular rhythm.  Pulmonary:     Effort: Pulmonary effort is normal. No respiratory distress.     Breath sounds: No wheezing.  Abdominal:     General: There is no distension.     Palpations: Abdomen is soft.     Tenderness: There is no abdominal tenderness. There is no guarding.  Musculoskeletal:        General: No tenderness or deformity.  Lymphadenopathy:     Cervical: No cervical adenopathy.  Skin:    General: Skin is warm.     Coloration: Skin is not pale.  Neurological:     Mental Status: She is alert and oriented to person, place, and time.  Psychiatric:        Mood and Affect: Mood and affect normal.        Behavior: Behavior normal.    LABORATORY DATA:  I have reviewed the data as listed Lab Results  Component Value Date   WBC 5.1 11/30/2022   HGB 10.1 (L) 11/30/2022   HCT 32.6 (L) 11/30/2022   MCV 74.4 (L) 11/30/2022   PLT 271 11/30/2022   Recent Labs    09/22/22 1016 10/29/22 0944 11/17/22 1312 11/30/22 1306  NA 137 137 136 136  K 3.2* 2.8* 3.3* 3.3*  CL 103 102 105 101  CO2 26 25 23 24   GLUCOSE 107* 164* 127* 116*  BUN 10 16 10 9   CREATININE 0.79  0.91 0.80 0.91  CALCIUM 8.6* 8.8* 8.9 8.8*  GFRNONAA >60 >60 >60 >60  PROT 7.2 7.7  --  7.3  ALBUMIN 3.6 3.7  --  3.8  AST 15 19  --  15  ALT 10 12  --  10  ALKPHOS 78 83  --  73  BILITOT 0.2* 0.4  --  0.6   Component Ref Range & Units 1 mo ago (06/25/22) 4 mo ago (03/26/22) 6 mo ago (01/22/22) 8 mo ago (11/23/21) 1 yr ago (06/23/21) 1 yr ago (04/20/21) 1 yr ago (01/23/21)  CA 19-9 0 - 35 U/mL 24 21 CM 17 CM 12 CM 14 CM 13 CM 16 CM     RADIOGRAPHIC STUDIES: I have personally reviewed the radiological images as listed and agreed with the findings in the report. NM PET Image Restage (PS) Skull Base to Thigh (F-18 FDG)  Result Date: 11/15/2022 CLINICAL DATA:  Subsequent treatment strategy for pancreatic cancer with rising tumor markers. EXAM: NUCLEAR MEDICINE PET SKULL BASE TO THIGH TECHNIQUE: 10.1 mCi F-18 FDG was injected intravenously. Full-ring PET imaging was performed from the skull base to thigh after the radiotracer. CT data was obtained and used for attenuation correction and anatomic localization. Fasting blood glucose: 113 mg/dl COMPARISON:  CT chest abdomen pelvis 10/25/2022 and PET 11/28/2019. FINDINGS: Mediastinal blood pool activity: SUV max 3.0 Liver activity: SUV max NA NECK: No abnormal hypermetabolism. Incidental CT findings: None. CHEST: Possible focal hypermetabolism along the medial aspect of left thyroid, SUV max 3.7, without a definitive CT correlate. No additional abnormal hypermetabolism. Incidental CT findings: Right IJ Port-A-Cath terminates in the low SVC. Atherosclerotic calcification of the aorta and coronary arteries. Heart is mildly enlarged. No pericardial or pleural effusion. 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 ABDOMEN/PELVIS: Abnormal hypermetabolism associated with the pancreaticojejunostomy is centered along the internally in stent, SUV max 7.6 (4/80). Lack of IV contrast limits CT comparison.  Hypermetabolic aortocaval lymph node  measures approximately 8 mm (4/79), SUV max 3.7. Borderline hypermetabolic left periaortic lymph node measures 6 mm (4/81), SUV max 3.1. No additional abnormal hypermetabolism. Incidental CT findings: Pneumobilia. Liver is otherwise grossly unremarkable. Cholecystectomy. Adrenal glands, kidneys and spleen are grossly unremarkable. Pancreaticoduodenectomy, pancreaticojejunostomy and gastrojejunostomy. Stomach, small bowel and colon are otherwise grossly unremarkable. SKELETON: No abnormal hypermetabolism. Incidental CT findings: Degenerative changes in the spine. IMPRESSION: 1. Whipple procedure with abnormal hypermetabolism centered at the pancreaticojejunostomy, worrisome for disease recurrence. Associated pancreaticojejunostomy stent in place. 2. Hypermetabolic adjacent retroperitoneal lymph nodes, worrisome for metastatic disease. 3. Focal hypermetabolism along the medial aspect of the left thyroid. Recommend thyroid ultrasound as malignancy cannot be excluded. 4. Aortic atherosclerosis (ICD10-I70.0). Coronary artery calcification. Electronically Signed   By: Leanna Battles M.D.   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 #10 cycles]-s/p Whipple's [on April 19th 2022]- ypT2ypN0.  AUG 18th, 2024- PET scan- Whipple procedure with abnormal hypermetabolism centered at the pancreaticojejunostomy, worrisome for disease recurrence. Associated pancreaticojejunostomy stent in place; Hypermetabolic adjacent retroperitoneal lymph nodes, worrisome for metastatic disease. Ca 19-9 rising.  NGS-tempus- pending.  # Reviewed with the patient regarding concerns for recurrent pancreatic cancer-given the imaging findings and also rising CA 19-9. Reviewed imaging at tumor conference.Discussed with Dr.Byerley.   # proceed with chemo gemcitabine cycle #1- day 1 today.  Labs-CBC/chemistries were reviewed with the patient.  # Incidental PET scan- AUG 2024- Focal hypermetabolism along the medial aspect of the left thyroid. Recommend thyroid ultrasound as malignancy cannot be excluded.  Hold off any further workup given patient's pancreatic cancer.  # Anemia- microcytic- Stable 9-10; NO IDA- ;  colo [EUS- 2021; oct 2022- colo; Dr.Anna]; stable.   # Severe hypokalemia potassium 2.8-.  Potassium 3.4.  Continue K-Dur 20 mEq twice daily. Stable.    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  Poorly controlled.  Recommend evaluation with neurology.  # Hypertension-poorly controlled 180s]; Continue Norvasc/Metoprlol.[ per pt At home- 130/70s ;AGAIN RE-ITERATED to bring log. stable.  # mediport:  continue port flushes q 2-97M.stable.   # Anxiety/depression/insomnia: Likely secondary to recurrent disease diagnosis.  Recommend evaluation with Dr. Windy Fast for consideration of antidepressants. # referral to Dr.vaslow re: peripheral neuropathy  # DISPOSITION:  # CANCEL OCT appts/ CT scan-  # chemo today As per IS-  # # follow up in 1 week-  labs-port-cbc/cmp;vit D 25-OH;  gem chemo - # follow up in 3 week- MD; labs-port-cbc/cmp; Ca 19-9- gem chemo # follow up in 4  week-  labs-port-cbc/cmp; gem chemo -Dr.B     Earna Coder, MD 11/30/2022

## 2022-11-30 NOTE — Assessment & Plan Note (Addendum)
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 #10 cycles]-s/p Whipple's [on April 19th 2022]- ypT2ypN0.  AUG 18th, 2024- PET scan- Whipple procedure with abnormal hypermetabolism centered at the pancreaticojejunostomy, worrisome for disease recurrence. Associated pancreaticojejunostomy stent in place; Hypermetabolic adjacent retroperitoneal lymph nodes, worrisome for metastatic disease. Ca 19-9 rising.  NGS-tempus- pending.  # Reviewed with the patient regarding concerns for recurrent pancreatic cancer-given the imaging findings and also rising CA 19-9. Reviewed imaging at tumor conference.Discussed with Dr.Byerley.   # proceed with chemo gemcitabine cycle #1- day 1 today. Labs-CBC/chemistries were reviewed with the patient.  # Incidental PET scan- AUG 2024- Focal hypermetabolism along the medial aspect of the left thyroid. Recommend thyroid ultrasound as malignancy cannot be excluded.  Hold off any further workup given patient's pancreatic cancer.  # Anemia- microcytic- Stable 9-10; NO IDA- ;  colo [EUS- 2021; oct 2022- colo; Dr.Anna]; stable.   # Severe hypokalemia potassium 2.8-.  Potassium 3.4.  Continue K-Dur 20 mEq twice daily. Stable.    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  Poorly controlled.  Recommend evaluation with neurology.  # Hypertension-poorly controlled 180s]; Continue Norvasc/Metoprlol.[ per pt At home- 130/70s ;AGAIN RE-ITERATED to bring log. stable.  # mediport:  continue port flushes q 2-42M.stable.   # Anxiety/depression/insomnia: Likely secondary to recurrent disease diagnosis.  Recommend evaluation with Dr. Windy Fast for consideration of antidepressants. # referral to Dr.vaslow re: peripheral neuropathy  # DISPOSITION:  # CANCEL OCT appts/ CT scan-  # chemo today As per IS-  # # follow up in 1 week-  labs-port-cbc/cmp;vit D 25-OH;  gem chemo - # follow up in 3 week- MD;  labs-port-cbc/cmp; Ca 19-9- gem chemo # follow up in 4  week-  labs-port-cbc/cmp; gem chemo -Dr.B

## 2022-11-30 NOTE — Progress Notes (Signed)
Palliative Medicine [facility] at [facility] Telephone:([phone]) [phone] Fax:(336) [phone]   Name: Robin Daniels Date: 11/30/2022 MRN: [id number]  DOB: [birth year]  Patient Care Team: Louis Matte, MD as PCP - General (Internal Medicine) Toy Cookey, FNP (Family Medicine) Jim Like, RN as Registered Nurse Scarlett Presto, RN (Inactive) as Registered Nurse Benita Gutter, RN as Oncology Nurse Navigator Almond Lint, MD as Consulting Physician (General Surgery) Earna Coder, MD as Consulting Physician (Internal Medicine)    REASON FOR CONSULTATION: Robin Daniels is a 67 y.o. 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  Patient has had depression and anxiety.  She was referred to palliative care to address goals and manage ongoing symptoms.  SOCIAL HISTORY:     reports that she has never smoked. She has never used smokeless tobacco. She reports that she does not drink alcohol and does not use drugs.  Patient divorced.  She lives at home with her mother and son.  Patient also has a daughter and multiple siblings who are involved.  Patient retired from [employer] where she worked in [employer].  ADVANCE DIRECTIVES:    CODE STATUS:   PAST MEDICAL HISTORY: 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/04/2019   Complication of anesthesia    Fluttering heart    Hyperlipidemia    Hypertension    PONV (postoperative nausea and vomiting)    Pre-diabetes    Vertigo    none recently    PAST SURGICAL HISTORY:  Past Surgical History:  Procedure Laterality Date   CATARACT EXTRACTION W/PHACO Right 06/09/2021   Procedure: CATARACT EXTRACTION PHACO AND INTRAOCULAR LENS PLACEMENT (IOC) RIGHT malyugin;  Surgeon: Galen Manila, MD;  Location: [facility];   Service: Ophthalmology;  Laterality: Right;  12.40 1:07.0   COLONOSCOPY WITH PROPOFOL N/A 01/13/2021   Procedure: COLONOSCOPY WITH PROPOFOL;  Surgeon: Wyline Mood, MD;  Location: [facility] ENDOSCOPY;  Service: Gastroenterology;  Laterality: N/A;   ENDOSCOPIC RETROGRADE CHOLANGIOPANCREATOGRAPHY (ERCP) WITH PROPOFOL N/A 11/16/2019   Procedure: ENDOSCOPIC RETROGRADE CHOLANGIOPANCREATOGRAPHY (ERCP) WITH PROPOFOL;  Surgeon: Midge Minium, MD;  Location: [facility] ENDOSCOPY;  Service: Endoscopy;  Laterality: N/A;   ERCP N/A 03/27/2020   Procedure: ENDOSCOPIC RETROGRADE CHOLANGIOPANCREATOGRAPHY (ERCP);  Surgeon: Midge Minium, MD;  Location[facility] ENDOSCOPY;  Service: Endoscopy;  Laterality: N/A;   EUS N/A 11/29/2019   Procedure: FULL UPPER ENDOSCOPIC ULTRASOUND (EUS) RADIAL;  Surgeon: Doren Custard, MD;  Location: [facility] ENDOSCOPY;  Service: Gastroenterology;  Laterality: N/A;   IR FLUORO RM 30-60 MIN  07/30/2020   IR IMAGING GUIDED PORT INSERTION  12/14/2019   JEJUNOSTOMY Left 07/15/2020   Procedure: Rance Muir;  Surgeon: Almond Lint, MD;  Location: [facility] OR;  Service: General;  Laterality: Left;   LAPAROSCOPY N/A 07/15/2020   Procedure: DIAGNOSTIC LAPAROSCOPY;  Surgeon: Almond Lint, MD;  Location: [facility] OR;  Service: General;  Laterality: N/A;   right knee replacement Right [id number]   SHOULDER ARTHROSCOPY W/ ROTATOR CUFF REPAIR Right    WHIPPLE PROCEDURE N/A 07/15/2020   Procedure: WHIPPLE PROCEDURE;  Surgeon: Almond Lint, MD;  Location: [facility] OR;  Service: General;  Laterality: N/A;    HEMATOLOGY/ONCOLOGY HISTORY:  Oncology History Overview Note  #Pancreas adenocarcinoma-Stage IB- [id number]-[EUS- Dr.Spaete; [facility]/GI; mass 22x57mm; invading/abutting superior mesenteric vein; 2 enlarged lymph nodes peripancreatic/porta hepatis  largest 10 x 5.8 mm-nonpathologic based on EUS criteria/no biopsy]-borderline resectable.  PET scan no evidence of distant metastatic disease.  #Biliary obstruction status post ERCP and stenting  [Dr.Wohl]-  # SEP, 22,2021- GEM-ABRAXANE s/p cycle 1-d1 [discontinued secondary to shortage]; s/p evaluation with Dr. Fredirick Maudlin September]  # 01/02/2020-FOLFIRINOX; Neulasta. S/p FOLFIRINOX 10 cycles- April 19th whipples-  s/p neoadjuvant FOLFIRINX. #10 cycles]-s/p Whipple's [on April 19th 2022]- ypT2 [3.5cm]; pN0/11;   DIS-CONTINUED  FOLFIRINOX cycle #11 & 12-  Sec to PN-2-3.  PANCREAS (EXOCRINE), CARCINOMA: Resection  Procedure: Whipple procedure  Tumor Site: Head of pancreas.  Tumor Size: 3.5 cm, slide measurement.  Histologic Type: Pancreatic ductal adenocarcinoma.  Histologic Grade: Moderately differentiated.  Tumor Extension: Into peripancreatic connective tissue.  Treatment Effect: Moderate treatment effect.  Lymphovascular Invasion: Not identified.  Perineural Invasion: Present.  Margins: All surgical margins are negative for carcinoma.  Regional Lymph Nodes:       Number of Lymph Nodes with Tumor: 0       Number of Lymph Nodes Examined: 15  Distant Metastasis:       Distant Site(s) Involved: Not applicable.  Pathologic Stage Classification (pTNM, AJCC 8th Edition): ypT2, ypN0   # Borderline DM; HTN   AUG 18th, 2024- PET scan- Whipple procedure with abnormal hypermetabolism centered at the pancreaticojejunostomy, worrisome for disease recurrence. Associated pancreaticojejunostomy stent in place; Hypermetabolic adjacent retroperitoneal lymph nodes, worrisome for metastatic disease. Ca 19-9 rising.    SEP 3rd, 2024- single gem [3 weeks on 1 week off]-because of neuropathy.    Cancer of head of pancreas (HCC)  12/04/2019 Initial Diagnosis   Cancer of head of pancreas (HCC)   12/19/2019 - 12/19/2019 Chemotherapy   The patient had PACLitaxel-protein bound (ABRAXANE) chemo infusion 250 mg, 125 mg/m2 = 250 mg, Intravenous,  Once, 1 of 4 cycles Administration: 250 mg (12/19/2019) gemcitabine (GEMZAR) 2,000 mg in sodium chloride 0.9 % 250 mL chemo infusion, 1,976 mg, Intravenous,   Once, 1 of 4 cycles Administration: 2,000 mg (12/19/2019)  for chemotherapy treatment.    01/02/2020 - 05/15/2020 Chemotherapy   Patient is on Treatment Plan : PANCREAS Modified FOLFIRINOX q14d x 4 cycles     11/30/2022 -  Chemotherapy   Patient is on Treatment Plan : PANCREAS Gemcitabine D1,8, (1000) q21d       ALLERGIES:  has No Known Allergies.  MEDICATIONS:  Current Outpatient Medications  Medication Sig Dispense Refill   DULoxetine (CYMBALTA) 30 MG capsule Take 1 capsule (30 mg total) by mouth daily. 30 capsule 3   acetaminophen (TYLENOL) 325 MG tablet Take 650 mg by mouth every 6 (six) hours as needed for moderate pain.     amLODipine (NORVASC) 10 MG tablet Take 1 tablet (10 mg total) by mouth daily. 30 tablet 0   Calcium Carbonate-Vit D-Min (CALCIUM 600+D PLUS MINERALS PO) Take 1 tablet by mouth in the morning and at bedtime.     gabapentin (NEURONTIN) 300 MG capsule Take 300 mg by mouth daily.     lidocaine-prilocaine (EMLA) cream Apply on the port. 30 -45 min  prior to port access. 30 g 3   lisinopril (ZESTRIL) 10 MG tablet Take 10 mg by mouth daily.     metoprolol succinate (TOPROL-XL) 50 MG 24 hr tablet Take 1 tablet (50 mg total) by mouth daily. Take with or immediately following a meal. 30 tablet 0   ondansetron (ZOFRAN) 8 MG tablet One pill every 8 hours as needed for nausea/vomitting. 40 tablet 1   potassium  chloride SA (KLOR-CON M) 20 MEQ tablet Take 1 tablet (20 mEq total) by mouth 2 (two) times daily. 60 tablet 1   prochlorperazine (COMPAZINE) 10 MG tablet Take 1 tablet (10 mg total) by mouth every 6 (six) hours as needed for nausea or vomiting. 40 tablet 1   vitamin B-12 (CYANOCOBALAMIN) 1000 MCG tablet Take 1 tablet (1,000 mcg total) by mouth daily.     No current facility-administered medications for this visit.   Facility-Administered Medications Ordered in Other Visits  Medication Dose Route Frequency Provider Last Rate Last Admin   gemcitabine (GEMZAR) 1,900 mg in  sodium chloride 0.9 % 250 mL chemo infusion  1,000 mg/m2 (Treatment Plan Recorded) Intravenous Once Louretta Shorten R, MD       heparin lock flush 100 unit/mL  500 Units Intracatheter Once PRN Earna Coder, MD       sodium chloride flush (NS) 0.9 % injection 10 mL  10 mL Intravenous PRN Earna Coder, MD   10 mL at 06/23/21 1012    VITAL SIGNS: LMP  (LMP Unknown)  There were no vitals filed for this visit.  Estimated body mass index is 30.73 kg/m as calculated from the following:   Height as of an earlier encounter on 11/30/22: 5\' 4"  (1.626 m).   Weight as of an earlier encounter on 11/30/22: 179 lb (81.2 kg).  LABS: CBC:    Component Value Date/Time   WBC 5.1 11/30/2022 1306   WBC 4.3 09/22/2022 1016   HGB 10.1 (L) 11/30/2022 1306   HCT 32.6 (L) 11/30/2022 1306   PLT 271 11/30/2022 1306   MCV 74.4 (L) 11/30/2022 1306   NEUTROABS 3.2 11/30/2022 1306   LYMPHSABS 1.5 11/30/2022 1306   MONOABS 0.4 11/30/2022 1306   EOSABS 0.0 11/30/2022 1306   BASOSABS 0.0 11/30/2022 1306   Comprehensive Metabolic Panel:    Component Value Date/Time   NA 136 11/30/2022 1306   K 3.3 (L) 11/30/2022 1306   CL 101 11/30/2022 1306   CO2 24 11/30/2022 1306   BUN 9 11/30/2022 1306   CREATININE 0.91 11/30/2022 1306   GLUCOSE 116 (H) 11/30/2022 1306   CALCIUM 8.8 (L) 11/30/2022 1306   AST 15 11/30/2022 1306   ALT 10 11/30/2022 1306   ALKPHOS 73 11/30/2022 1306   BILITOT 0.6 11/30/2022 1306   PROT 7.3 11/30/2022 1306   ALBUMIN 3.8 11/30/2022 1306    RADIOGRAPHIC STUDIES: NM PET Image Restage (PS) Skull Base to Thigh (F-18 FDG)  Result Date: 11/15/2022 CLINICAL DATA:  Subsequent treatment strategy for pancreatic cancer with rising tumor markers. EXAM: NUCLEAR MEDICINE PET SKULL BASE TO THIGH TECHNIQUE: 10.1 mCi F-18 FDG was injected intravenously. Full-ring PET imaging was performed from the skull base to thigh after the radiotracer. CT data was obtained and used for attenuation  correction and anatomic localization. Fasting blood glucose: 113 mg/dl COMPARISON:  CT chest abdomen pelvis 10/25/2022 and PET 11/28/2019. FINDINGS: Mediastinal blood pool activity: SUV max 3.0 Liver activity: SUV max NA NECK: No abnormal hypermetabolism. Incidental CT findings: None. CHEST: Possible focal hypermetabolism along the medial aspect of left thyroid, SUV max 3.7, without a definitive CT correlate. No additional abnormal hypermetabolism. Incidental CT findings: Right IJ Port-A-Cath terminates in the low SVC. Atherosclerotic calcification of the aorta and coronary arteries. Heart is mildly enlarged. No pericardial or pleural effusion. 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 ABDOMEN/PELVIS: Abnormal hypermetabolism associated with the pancreaticojejunostomy is centered along the internally in stent, SUV max 7.6 (4/80).  Lack of IV contrast limits CT comparison. Hypermetabolic aortocaval lymph node measures approximately 8 mm (4/79), SUV max 3.7. Borderline hypermetabolic left periaortic lymph node measures 6 mm (4/81), SUV max 3.1. No additional abnormal hypermetabolism. Incidental CT findings: Pneumobilia. Liver is otherwise grossly unremarkable. Cholecystectomy. Adrenal glands, kidneys and spleen are grossly unremarkable. Pancreaticoduodenectomy, pancreaticojejunostomy and gastrojejunostomy. Stomach, small bowel and colon are otherwise grossly unremarkable. SKELETON: No abnormal hypermetabolism. Incidental CT findings: Degenerative changes in the spine. IMPRESSION: 1. Whipple procedure with abnormal hypermetabolism centered at the pancreaticojejunostomy, worrisome for disease recurrence. Associated pancreaticojejunostomy stent in place. 2. Hypermetabolic adjacent retroperitoneal lymph nodes, worrisome for metastatic disease. 3. Focal hypermetabolism along the medial aspect of the left thyroid. Recommend thyroid ultrasound as malignancy cannot be excluded. 4. Aortic atherosclerosis  (ICD10-I70.0). Coronary artery calcification. Electronically Signed   By: Leanna Battles M.D.   On: 11/15/2022 11:24    PERFORMANCE STATUS (ECOG) : 1 - Symptomatic but completely ambulatory  Review of Systems Unless otherwise noted, a complete review of systems is negative.  Physical Exam General: NAD Pulmonary: Unlabored Extremities: no edema, no joint deformities Skin: no rashes Neurological: Weakness but otherwise nonfocal  IMPRESSION: I met with patient in infusion.  Introduced palliative care services and attempted to establish therapeutic rapport.  Symptomatically, patient endorses significant anxiety since she learned about her cancer recurrence and prognosis.  She denies history or previous treatment for depression or anxiety.  She says that she will take a walk when she is feeling anxious and that helps calm her.  She denies severe insomnia. She does have loss of appetite, which she attributes to feeling anxious. No SI.   She is interested in referral for counseling.  Will place referral to [facility].   Patient also endorses numbness and tingling in her feet, which extends up to her ankles.  This is likely secondary to previous treatment with oxaliplatin.  She takes gabapentin 300 mg at bedtime, which she finds helpful for sleep.  However, daytime neuropathy persists.  Will plan trial of duloxetine as this may help with both anxiety and neuropathy.  At baseline, patient lives at home with her mother and son.  She is the primary caregiver for her mother.  Patient does describe a large and supportive family.  Patient says that she is independent with her own care.  PLAN: -Continue current scope of treatment -Continue gabapentin -Start duloxetine 30 mg daily.  Consider increasing to 60 mg daily if needed. -Referral to [facility] and LCSW -Will benefit from ACP -RTC 3 weeks   Patient expressed understanding and was in agreement with this plan. She also understands that  She can call the clinic at any time with any questions, concerns, or complaints.     Time Total: 20 minutes  Visit consisted of counseling and education dealing with the complex and emotionally intense issues of symptom management and palliative care in the setting of serious and potentially life-threatening illness.Greater than 50%  of this time was spent counseling and coordinating care related to the above assessment and plan.  Signed by: Laurette Schimke, PhD, NP-C

## 2022-11-30 NOTE — Progress Notes (Signed)
Drinks boost occasionally.  Has numbness in fingers/toes.

## 2022-12-01 ENCOUNTER — Inpatient Hospital Stay: Payer: 59 | Admitting: Licensed Clinical Social Worker

## 2022-12-01 LAB — CANCER ANTIGEN 19-9: CA 19-9: 116 U/mL — ABNORMAL HIGH (ref 0–35)

## 2022-12-01 NOTE — Progress Notes (Signed)
[facility] Clinical Social Work  Initial Assessment   LURIE CETRONE is a 67 y.o. year old female contacted by phone. Clinical Social Work was referred by medical provider for assessment of psychosocial needs.   SDOH (Social Determinants of Health) assessments performed: Yes SDOH Interventions    Flowsheet Row Clinical Support from 12/01/2022 in [facility] at [facility]  SDOH Interventions   Food Insecurity Interventions Intervention Not Indicated  Housing Interventions Intervention Not Indicated  Transportation Interventions Intervention Not Indicated  Utilities Interventions Intervention Not Indicated  Financial Strain Interventions Intervention Not Indicated       SDOH Screenings   Food Insecurity: No Food Insecurity (12/01/2022)  Housing: Low Risk  (12/01/2022)  Transportation Needs: No Transportation Needs (12/01/2022)  Utilities: Not At Risk (12/01/2022)  Depression (PHQ2-9): Low Risk  (12/01/2022)  Financial Resource Strain: Low Risk  (12/01/2022)  Tobacco Use: Low Risk  (11/30/2022)     Distress Screen completed: No     No data to display            Family/Social Information:  Housing Arrangement: patient lives with her mother and adult son. Family members/support persons in your life? Family Transportation concerns: no  Employment: Retired  Income source: Actor concerns: No Type of concern: None Food access concerns: no Religious or spiritual practice: Yes-Patient reports having a religious ractice. Services Currently in place:  Medicare and Medicaid.  Coping/ Adjustment to diagnosis: Patient understands treatment plan and what happens next? yes Concerns about diagnosis and/or treatment: Feelings of anger or sadness Patient reported stressors: Adjusting to my illness Hopes and/or priorities: Family Patient enjoys time with family/ friends Current coping skills/ strengths: Capable of independent living , Investment banker, corporate , Contractor , Radio producer fund of knowledge , Motivation for treatment/growth , and Supportive family/friends     SUMMARY: Current SDOH Barriers:  Mental Health Concerns   Clinical Social Work Clinical Goal(s):  Explore community resource options for unmet needs related to:  Depression    Interventions: Discussed common feeling and emotions when being diagnosed with cancer, and the importance of support during treatment Informed patient of the support team roles and support services at [facility] Provided CSW contact information and encouraged patient to call with any questions or concerns Provided patient with information about the [employer].  She declined grant information .  She reported speaking for the first time about feeling depressed yesterday.  She said she walks to relieve her sadness.  She declined CSW offer to provide therapy at this time and has several sisters and family members who are supportive.   Follow Up Plan: Patient will contact CSW with any support or resource needs Patient verbalizes understanding of plan: Yes    Dorothey Baseman, LCSW Clinical Social Worker [facility]  Patient is participating in a Managed Medicaid Plan:  Yes

## 2022-12-02 ENCOUNTER — Encounter: Payer: Self-pay | Admitting: Internal Medicine

## 2022-12-07 ENCOUNTER — Inpatient Hospital Stay: Payer: 59

## 2022-12-07 VITALS — BP 146/76 | HR 79 | Temp 96.0°F | Resp 18 | Wt 181.3 lb

## 2022-12-07 DIAGNOSIS — Z5111 Encounter for antineoplastic chemotherapy: Secondary | ICD-10-CM | POA: Diagnosis not present

## 2022-12-07 DIAGNOSIS — C250 Malignant neoplasm of head of pancreas: Secondary | ICD-10-CM

## 2022-12-07 LAB — CBC WITH DIFFERENTIAL (CANCER CENTER ONLY)
Abs Immature Granulocytes: 0.01 10*3/uL (ref 0.00–0.07)
Basophils Absolute: 0 10*3/uL (ref 0.0–0.1)
Basophils Relative: 1 %
Eosinophils Absolute: 0 10*3/uL (ref 0.0–0.5)
Eosinophils Relative: 1 %
HCT: 30.7 % — ABNORMAL LOW (ref 36.0–46.0)
Hemoglobin: 9.6 g/dL — ABNORMAL LOW (ref 12.0–15.0)
Immature Granulocytes: 0 %
Lymphocytes Relative: 32 %
Lymphs Abs: 1.4 10*3/uL (ref 0.7–4.0)
MCH: 23.2 pg — ABNORMAL LOW (ref 26.0–34.0)
MCHC: 31.3 g/dL (ref 30.0–36.0)
MCV: 74.3 fL — ABNORMAL LOW (ref 80.0–100.0)
Monocytes Absolute: 0.2 10*3/uL (ref 0.1–1.0)
Monocytes Relative: 4 %
Neutro Abs: 2.8 10*3/uL (ref 1.7–7.7)
Neutrophils Relative %: 62 %
Platelet Count: 180 10*3/uL (ref 150–400)
RBC: 4.13 MIL/uL (ref 3.87–5.11)
RDW: 15.7 % — ABNORMAL HIGH (ref 11.5–15.5)
WBC Count: 4.4 10*3/uL (ref 4.0–10.5)
nRBC: 0 % (ref 0.0–0.2)

## 2022-12-07 LAB — VITAMIN D 25 HYDROXY (VIT D DEFICIENCY, FRACTURES): Vit D, 25-Hydroxy: 44.8 ng/mL (ref 30–100)

## 2022-12-07 LAB — CMP (CANCER CENTER ONLY)
ALT: 15 U/L (ref 0–44)
AST: 24 U/L (ref 15–41)
Albumin: 3.6 g/dL (ref 3.5–5.0)
Alkaline Phosphatase: 77 U/L (ref 38–126)
Anion gap: 9 (ref 5–15)
BUN: 12 mg/dL (ref 8–23)
CO2: 22 mmol/L (ref 22–32)
Calcium: 8.8 mg/dL — ABNORMAL LOW (ref 8.9–10.3)
Chloride: 104 mmol/L (ref 98–111)
Creatinine: 0.97 mg/dL (ref 0.44–1.00)
GFR, Estimated: >60 mL/min (ref >60)
Glucose, Bld: 197 mg/dL — ABNORMAL HIGH (ref 70–99)
Potassium: 3.4 mmol/L — ABNORMAL LOW (ref 3.5–5.1)
Sodium: 135 mmol/L (ref 135–145)
Total Bilirubin: 0.3 mg/dL (ref 0.3–1.2)
Total Protein: 7.5 g/dL (ref 6.5–8.1)

## 2022-12-07 MED ORDER — SODIUM CHLORIDE 0.9 % IV SOLN
Freq: Once | INTRAVENOUS | Status: AC
  Filled 2022-12-07: qty 250

## 2022-12-07 MED ORDER — PROCHLORPERAZINE MALEATE 10 MG PO TABS
10.0000 mg | ORAL_TABLET | Freq: Once | ORAL | Status: AC
  Administered 2022-12-07: 10 mg via ORAL
  Filled 2022-12-07: qty 1

## 2022-12-07 MED ORDER — HEPARIN SOD (PORK) LOCK FLUSH 100 UNIT/ML IV SOLN
500.0000 [IU] | Freq: Once | INTRAVENOUS | Status: AC | PRN
  Administered 2022-12-07: 500 [IU]
  Filled 2022-12-07: qty 5

## 2022-12-07 MED ORDER — SODIUM CHLORIDE 0.9 % IV SOLN
1000.0000 mg/m2 | Freq: Once | INTRAVENOUS | Status: AC
  Administered 2022-12-07: 1900 mg via INTRAVENOUS
  Filled 2022-12-07: qty 49.97

## 2022-12-07 NOTE — Progress Notes (Signed)
Nutrition Follow-up:  Re-consulted  67 year old female with recurrent stage IV pancreatic cancer.  Patient receiving gemcitabine.    Met with patient during infusion with daughter.  Patient reports that appetite is good currently.  Had been decreased recently due to stress.  For breakfast has bacon, egg, cheese, grits, juice or breakfast meat and french toast sticks or chicken sandwich.  Lunch is sandwich with potato chips.  Dinner last night was fried chicken with cabbage.  Patient lives with mother and son.  She cooks primarily.  Denies stomach upset, pain, constipation or diarrhea.      Medications: zofran, vit B 12, compazine, calcium/vit d  Labs: reviewed  Anthropometrics:   Height: 64 inches Weight: 181 lb 5.3 oz Weight 181 lb last seen by RD on 01/23/2021 BMI: 31   NUTRITION DIAGNOSIS: none at this time    INTERVENTION:  Encouraged well balanced diet including plant foods and good sources of protein Contact information provided    MONITORING, EVALUATION, GOAL: weight trends, intake   NEXT VISIT: Tuesday, Oct 1 during infusion  Mykalah Saari B. Freida Busman, RD, LDN Registered Dietitian [phone]

## 2022-12-07 NOTE — Patient Instructions (Signed)
[facility]  Discharge Instructions: Thank you for choosing [facility] to provide your oncology and hematology care.  If you have a lab appointment with the Cancer Center, please go directly to the Cancer Center and check in at the registration area.  Wear comfortable clothing and clothing appropriate for easy access to any Portacath or PICC line.   We strive to give you quality time with your provider. You may need to reschedule your appointment if you arrive late (15 or more minutes).  Arriving late affects you and other patients whose appointments are after yours.  Also, if you miss three or more appointments without notifying the office, you may be dismissed from the clinic at the provider's discretion.      For prescription refill requests, have your pharmacy contact our office and allow 72 hours for refills to be completed.    Today you received the following chemotherapy and/or immunotherapy agents Gemzar       To help prevent nausea and vomiting after your treatment, we encourage you to take your nausea medication as directed.  BELOW ARE SYMPTOMS THAT SHOULD BE REPORTED IMMEDIATELY: *FEVER GREATER THAN 100.4 F (38 C) OR HIGHER *CHILLS OR SWEATING *NAUSEA AND VOMITING THAT IS NOT CONTROLLED WITH YOUR NAUSEA MEDICATION *UNUSUAL SHORTNESS OF BREATH *UNUSUAL BRUISING OR BLEEDING *URINARY PROBLEMS (pain or burning when urinating, or frequent urination) *BOWEL PROBLEMS (unusual diarrhea, constipation, pain near the anus) TENDERNESS IN MOUTH AND THROAT WITH OR WITHOUT PRESENCE OF ULCERS (sore throat, sores in mouth, or a toothache) UNUSUAL RASH, SWELLING OR PAIN  UNUSUAL VAGINAL DISCHARGE OR ITCHING   Items with * indicate a potential emergency and should be followed up as soon as possible or go to the Emergency Department if any problems should occur.  Please show the CHEMOTHERAPY ALERT CARD or IMMUNOTHERAPY ALERT CARD at check-in to  the Emergency Department and triage nurse.  Should you have questions after your visit or need to cancel or reschedule your appointment, please contact [facility]  [phone] and follow the prompts.  Office hours are 8:00 a.m. to 4:30 p.m. Monday - Friday. Please note that voicemails left after 4:00 p.m. may not be returned until the following business day.  We are closed weekends and major holidays. You have access to a nurse at all times for urgent questions. Please call the main number to the clinic [phone] and follow the prompts.  For any non-urgent questions, you may also contact your provider using MyChart. We now offer e-Visits for anyone 18 and older to request care online for non-urgent symptoms. For details visit [email or web address].   Also download the MyChart app! Go to the app store, search "MyChart", open the app, select [facility], and log in with your MyChart username and password.    

## 2022-12-10 ENCOUNTER — Encounter: Payer: Self-pay | Admitting: Internal Medicine

## 2022-12-21 ENCOUNTER — Inpatient Hospital Stay: Payer: 59

## 2022-12-21 ENCOUNTER — Encounter: Payer: Self-pay | Admitting: Internal Medicine

## 2022-12-21 ENCOUNTER — Inpatient Hospital Stay (HOSPITAL_BASED_OUTPATIENT_CLINIC_OR_DEPARTMENT_OTHER): Payer: 59 | Admitting: Internal Medicine

## 2022-12-21 VITALS — BP 147/69 | HR 67 | Temp 96.3°F | Resp 18

## 2022-12-21 VITALS — BP 150/76 | HR 98 | Temp 97.8°F | Resp 19 | Wt 180.2 lb

## 2022-12-21 DIAGNOSIS — C250 Malignant neoplasm of head of pancreas: Secondary | ICD-10-CM

## 2022-12-21 DIAGNOSIS — Z5111 Encounter for antineoplastic chemotherapy: Secondary | ICD-10-CM | POA: Diagnosis not present

## 2022-12-21 LAB — CMP (CANCER CENTER ONLY)
ALT: 19 U/L (ref 0–44)
AST: 22 U/L (ref 15–41)
Albumin: 3.8 g/dL (ref 3.5–5.0)
Alkaline Phosphatase: 86 U/L (ref 38–126)
Anion gap: 7 (ref 5–15)
BUN: 13 mg/dL (ref 8–23)
CO2: 25 mmol/L (ref 22–32)
Calcium: 9 mg/dL (ref 8.9–10.3)
Chloride: 104 mmol/L (ref 98–111)
Creatinine: 1.01 mg/dL — ABNORMAL HIGH (ref 0.44–1.00)
GFR, Estimated: >60 mL/min (ref >60)
Glucose, Bld: 169 mg/dL — ABNORMAL HIGH (ref 70–99)
Potassium: 3.5 mmol/L (ref 3.5–5.1)
Sodium: 136 mmol/L (ref 135–145)
Total Bilirubin: 0.5 mg/dL (ref 0.3–1.2)
Total Protein: 7.6 g/dL (ref 6.5–8.1)

## 2022-12-21 LAB — CBC WITH DIFFERENTIAL (CANCER CENTER ONLY)
Abs Immature Granulocytes: 0.02 10*3/uL (ref 0.00–0.07)
Basophils Absolute: 0 10*3/uL (ref 0.0–0.1)
Basophils Relative: 0 %
Eosinophils Absolute: 0 10*3/uL (ref 0.0–0.5)
Eosinophils Relative: 0 %
HCT: 30.1 % — ABNORMAL LOW (ref 36.0–46.0)
Hemoglobin: 9.5 g/dL — ABNORMAL LOW (ref 12.0–15.0)
Immature Granulocytes: 0 %
Lymphocytes Relative: 37 %
Lymphs Abs: 1.8 10*3/uL (ref 0.7–4.0)
MCH: 23.8 pg — ABNORMAL LOW (ref 26.0–34.0)
MCHC: 31.6 g/dL (ref 30.0–36.0)
MCV: 75.4 fL — ABNORMAL LOW (ref 80.0–100.0)
Monocytes Absolute: 0.5 10*3/uL (ref 0.1–1.0)
Monocytes Relative: 9 %
Neutro Abs: 2.6 10*3/uL (ref 1.7–7.7)
Neutrophils Relative %: 54 %
Platelet Count: 624 10*3/uL — ABNORMAL HIGH (ref 150–400)
RBC: 3.99 MIL/uL (ref 3.87–5.11)
RDW: 17.7 % — ABNORMAL HIGH (ref 11.5–15.5)
WBC Count: 4.9 10*3/uL (ref 4.0–10.5)
nRBC: 0 % (ref 0.0–0.2)

## 2022-12-21 MED ORDER — SODIUM CHLORIDE 0.9 % IV SOLN
1000.0000 mg/m2 | Freq: Once | INTRAVENOUS | Status: AC
  Administered 2022-12-21: 1900 mg via INTRAVENOUS
  Filled 2022-12-21: qty 49.97

## 2022-12-21 MED ORDER — HEPARIN SOD (PORK) LOCK FLUSH 100 UNIT/ML IV SOLN
500.0000 [IU] | Freq: Once | INTRAVENOUS | Status: AC | PRN
  Administered 2022-12-21: 500 [IU]
  Filled 2022-12-21: qty 5

## 2022-12-21 MED ORDER — SODIUM CHLORIDE 0.9 % IV SOLN
Freq: Once | INTRAVENOUS | Status: AC
  Filled 2022-12-21: qty 250

## 2022-12-21 MED ORDER — PROCHLORPERAZINE MALEATE 10 MG PO TABS
10.0000 mg | ORAL_TABLET | Freq: Once | ORAL | Status: AC
  Administered 2022-12-21: 10 mg via ORAL
  Filled 2022-12-21: qty 1

## 2022-12-21 NOTE — Progress Notes (Signed)
[facility] CONSULT NOTE  Patient Care Team: Entzminger, Danton Clap, MD as PCP - General (Internal Medicine) Toy Cookey, FNP (Family Medicine) Jim Like, RN as Registered Nurse Scarlett Presto, RN (Inactive) as Registered Nurse Benita Gutter, RN as Oncology Nurse Navigator Almond Lint, MD as Consulting Physician (General Surgery) Earna Coder, MD as Consulting Physician (Internal Medicine)  CHIEF COMPLAINTS/PURPOSE OF CONSULTATION: pancreas adenocarcinoma  Oncology History Overview Note  #Pancreas adenocarcinoma-Stage IB- [id number]-[EUS- Dr.Spaete; [facility]/GI; mass 22x28mm; invading/abutting superior mesenteric vein; 2 enlarged lymph nodes peripancreatic/porta hepatis largest 10 x 5.8 mm-nonpathologic based on EUS criteria/no biopsy]-borderline resectable.  PET scan no evidence of distant metastatic disease.  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 Fredirick Maudlin September]  # 01/02/2020-FOLFIRINOX; Neulasta. S/p FOLFIRINOX 10 cycles- April 19th whipples-  s/p neoadjuvant FOLFIRINX. #10 cycles]-s/p Whipple's [on April 19th 2022]- ypT2 [3.5cm]; pN0/11;   DIS-CONTINUED  FOLFIRINOX cycle #11 & 12-  Sec to PN-2-3.  PANCREAS (EXOCRINE), CARCINOMA: Resection  Procedure: Whipple procedure  Tumor Site: Head of pancreas.  Tumor Size: 3.5 cm, slide measurement.  Histologic Type: Pancreatic ductal adenocarcinoma.  Histologic Grade: Moderately differentiated.  Tumor Extension: Into peripancreatic connective tissue.  Treatment Effect: Moderate treatment effect.  Lymphovascular Invasion: Not identified.  Perineural Invasion: Present.  Margins: All surgical margins are negative for carcinoma.  Regional Lymph Nodes:       Number of Lymph Nodes with Tumor: 0       Number of Lymph Nodes Examined: 15  Distant Metastasis:       Distant  Site(s) Involved: Not applicable.  Pathologic Stage Classification (pTNM, AJCC 8th Edition): ypT2, ypN0   # Borderline DM; HTN   AUG 18th, 2024- PET scan- Whipple procedure with abnormal hypermetabolism centered at the pancreaticojejunostomy, worrisome for disease recurrence. Associated pancreaticojejunostomy stent in place; Hypermetabolic adjacent retroperitoneal lymph nodes, worrisome for metastatic disease. Ca 19-9 rising.    SEP 3rd, 2024- single gem [3 weeks on 1 week off]-because of neuropathy.    Cancer of head of pancreas (HCC)  12/04/2019 Initial Diagnosis   Cancer of head of pancreas (HCC)   12/19/2019 - 12/19/2019 Chemotherapy   The patient had PACLitaxel-protein bound (ABRAXANE) chemo infusion 250 mg, 125 mg/m2 = 250 mg, Intravenous,  Once, 1 of 4 cycles Administration: 250 mg (12/19/2019) gemcitabine (GEMZAR) 2,000 mg in sodium chloride 0.9 % 250 mL chemo infusion, 1,976 mg, Intravenous,  Once, 1 of 4 cycles Administration: 2,000 mg (12/19/2019)  for chemotherapy treatment.    01/02/2020 - 05/15/2020 Chemotherapy   Patient is on Treatment Plan : PANCREAS Modified FOLFIRINOX q14d x 4 cycles     11/30/2022 -  Chemotherapy   Patient is on Treatment Plan : PANCREAS Gemcitabine D1,8, (1000) q21d      HISTORY OF PRESENTING ILLNESS: Patient ambulating-independently.  Alone.   Robin Daniels 67 y.o.  female history recurrent stage IV  pancreatic adeno ca  is here for follow-up- and proceed with single agent gemcitabine chemo therapy.   In the interim patient has been evaluated by symptom management clinic and also social worker-for anxiety/depression.   Denies pain or nausea. Patient has been taking potassium pills since last visit.  Patient continues to have tingling and numbness in the extremities- currently on gabapentin not significantly better or worse.  Did not start cymblata as recommended.  Denies any worsening abdominal pain.  No fever no chills.Weight  is stable.   Review of  Systems  Constitutional:  Negative for chills, diaphoresis, fever and malaise/fatigue.  HENT:  Negative for nosebleeds and sore throat.   Eyes:  Negative for double vision.  Respiratory:  Negative for cough, hemoptysis, sputum production, shortness of breath and wheezing.   Cardiovascular:  Negative for chest pain, palpitations, orthopnea and leg swelling.  Gastrointestinal:  Negative for abdominal pain, blood in stool, constipation, diarrhea, heartburn, melena, nausea and vomiting.  Genitourinary:  Negative for dysuria, frequency and urgency.  Musculoskeletal:  Negative for back pain and joint pain.  Skin: Negative.  Negative for itching and rash.  Neurological:  Positive for tingling. Negative for dizziness, focal weakness, weakness and headaches.  Endo/Heme/Allergies:  Does not bruise/bleed easily.  Psychiatric/Behavioral:  Negative for depression. The patient is not nervous/anxious and does not have insomnia.      MEDICAL HISTORY:  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/04/2019   Complication of anesthesia    Fluttering heart    Hyperlipidemia    Hypertension    PONV (postoperative nausea and vomiting)    Pre-diabetes    Vertigo    none recently    SURGICAL HISTORY: Past Surgical History:  Procedure Laterality Date   CATARACT EXTRACTION W/PHACO Right 06/09/2021   Procedure: CATARACT EXTRACTION PHACO AND INTRAOCULAR LENS PLACEMENT (IOC) RIGHT malyugin;  Surgeon: Galen Manila, MD;  Location: [facility];  Service: Ophthalmology;  Laterality: Right;  12.40 1:07.0   COLONOSCOPY WITH PROPOFOL N/A 01/13/2021   Procedure: COLONOSCOPY WITH PROPOFOL;  Surgeon: Wyline Mood, MD;  Location: [facility] END[facility]COPY;  Service: Gastroenterology;  Laterality: N/A;   ENDOSCOPIC RETROGRADE CHOLANGIOPANCREATOGRAPHY (ERCP) WITH PROPOFOL N/A 11/16/2019   Procedure: ENDOSCOPIC RETROGRADE  CHOLANGIOPANCREATOGRAPHY (ERCP) WITH PROPOFOL;  Surgeon: Midge Minium, MD;  Location: [facility] ENDOSCOPY;  Service: Endoscopy;  Laterality: N/A;   ERCP N/A 03/27/2020   Procedure: ENDOSCOPIC RETROGRADE CHOLANGIOPANCREATOGRAPHY (ERCP);  Surgeon: Midge Minium, MD;  Location: [facility] ENDOSCOPY;  Service: Endoscopy;  Laterality: N/A;   EUS N/A 11/29/2019   Procedure: FULL UPPER ENDOSCOPIC ULTRASOUND (EUS) RADIAL;  Surgeon: Doren Custard, MD;  Location: [facility] ENDOSCOPY;  Service: Gastroenterology;  Laterality: N/A;   IR FLUORO RM 30-60 MIN  07/30/2020   IR IMAGING GUIDED PORT INSERTION  12/14/2019   JEJUNOSTOMY Left 07/15/2020   Procedure: Rance Muir;  Surgeon: Almond Lint, MD;  Location: [facility] OR;  Service: General;  Laterality: Left;   LAPAROSCOPY N/A 07/15/2020   Procedure: DIAGNOSTIC LAPAROSCOPY;  Surgeon: Almond Lint, MD;  Location: [facility] OR;  Service: General;  Laterality: N/A;   right knee replacement Right [id number]   SHOULDER ARTHROSCOPY W/ ROTATOR CUFF REPAIR Right    WHIPPLE PROCEDURE N/A 07/15/2020   Procedure: WHIPPLE PROCEDURE;  Surgeon: Almond Lint, MD;  Location: [facility] OR;  Service: General;  Laterality: N/A;    SOCIAL HISTORY: Social History   Socioeconomic History   Marital status: Divorced    Spouse name: Not on file   Number of children: Not on file   Years of education: Not on file   Highest education level: Not on file  Occupational History   Not on file  Tobacco Use   Smoking status: Never   Smokeless tobacco: Never  Vaping Use   Vaping status: Never Used  Substance and Sexual Activity   Alcohol use: No    Alcohol/week: 0.0 standard drinks of alcohol  Drug use: No   Sexual activity: Never  Other Topics Concern   Not on file  Social History Narrative   ** Merged History Encounter **       Lives in [place]; with mom and son; worked in dietary; never smoked; no alcohol.    Social Determinants of Health   Financial Resource Strain: Low Risk  (12/01/2022)   Overall  Financial Resource Strain (CARDIA)    Difficulty of Paying Living Expenses: Not hard at all  Food Insecurity: No Food Insecurity (12/01/2022)   Hunger Vital Sign    Worried About Running Out of Food in the Last Year: Never true    Ran Out of Food in the Last Year: Never true  Transportation Needs: No Transportation Needs (12/01/2022)   PRAPARE - Administrator, Civil Service (Medical): No    Lack of Transportation (Non-Medical): No  Physical Activity: Not on file  Stress: Not on file  Social Connections: Not on file  Intimate Partner Violence: Not At Risk (12/01/2022)   Humiliation, Afraid, Rape, and Kick questionnaire    Fear of Current or Ex-Partner: No    Emotionally Abused: No    Physically Abused: No    Sexually Abused: No    FAMILY HISTORY: Family History  Problem Relation Age of Onset   Diabetes Mother    Hyperlipidemia Mother    Hypertension Mother    Vision loss Mother    Arthritis Father    Hyperlipidemia Father    Hypertension Father    Breast cancer Maternal Aunt     ALLERGIES:  has No Known Allergies.  MEDICATIONS:  Current Outpatient Medications  Medication Sig Dispense Refill   acetaminophen (TYLENOL) 325 MG tablet Take 650 mg by mouth every 6 (six) hours as needed for moderate pain.     amLODipine (NORVASC) 10 MG tablet Take 1 tablet (10 mg total) by mouth daily. 30 tablet 0   Calcium Carbonate-Vit D-Min (CALCIUM 600+D PLUS MINERALS PO) Take 1 tablet by mouth in the morning and at bedtime.     DULoxetine (CYMBALTA) 30 MG capsule Take 1 capsule (30 mg total) by mouth daily. 30 capsule 3   gabapentin (NEURONTIN) 300 MG capsule Take 300 mg by mouth daily.     lidocaine-prilocaine (EMLA) cream Apply on the port. 30 -45 min  prior to port access. 30 g 3   lisinopril (ZESTRIL) 10 MG tablet Take 10 mg by mouth daily.     metoprolol succinate (TOPROL-XL) 50 MG 24 hr tablet Take 1 tablet (50 mg total) by mouth daily. Take with or immediately following a meal.  30 tablet 0   ondansetron (ZOFRAN) 8 MG tablet One pill every 8 hours as needed for nausea/vomitting. 40 tablet 1   potassium chloride SA (KLOR-CON M) 20 MEQ tablet Take 1 tablet (20 mEq total) by mouth 2 (two) times daily. 60 tablet 1   prochlorperazine (COMPAZINE) 10 MG tablet Take 1 tablet (10 mg total) by mouth every 6 (six) hours as needed for nausea or vomiting. 40 tablet 1   vitamin B-12 (CYANOCOBALAMIN) 1000 MCG tablet Take 1 tablet (1,000 mcg total) by mouth daily.     No current facility-administered medications for this visit.   Facility-Administered Medications Ordered in Other Visits  Medication Dose Route Frequency Provider Last Rate Last Admin   gemcitabine (GEMZAR) 1,900 mg in sodium chloride 0.9 % 250 mL chemo infusion  1,000 mg/m2 (Treatment Plan Recorded) Intravenous Once Earna Coder, MD  sodium chloride flush (NS) 0.9 % injection 10 mL  10 mL Intravenous PRN Louretta Shorten R, MD   10 mL at 06/23/21 1012      PHYSICAL EXAMINATION: ECOG PERFORMANCE STATUS: 0 - Asymptomatic  Vitals:   12/21/22 1419  BP: (!) 150/76  Pulse: 98  Resp: 19  Temp: 97.8 F (36.6 C)  SpO2: 99%      Filed Weights   12/21/22 1419  Weight: 180 lb 3.2 oz (81.7 kg)     Physical Exam Vitals reviewed.  Constitutional:      Appearance: She is not ill-appearing.  HENT:     Head: Normocephalic and atraumatic.     Mouth/Throat:     Pharynx: No oropharyngeal exudate.  Eyes:     General: No scleral icterus. Cardiovascular:     Rate and Rhythm: Normal rate and regular rhythm.  Pulmonary:     Effort: Pulmonary effort is normal. No respiratory distress.     Breath sounds: No wheezing.  Abdominal:     General: There is no distension.     Palpations: Abdomen is soft.     Tenderness: There is no abdominal tenderness. There is no guarding.  Musculoskeletal:        General: No tenderness or deformity.  Lymphadenopathy:     Cervical: No cervical adenopathy.  Skin:     General: Skin is warm.     Coloration: Skin is not pale.  Neurological:     Mental Status: She is alert and oriented to person, place, and time.  Psychiatric:        Mood and Affect: Mood and affect normal.        Behavior: Behavior normal.    LABORATORY DATA:  I have reviewed the data as listed Lab Results  Component Value Date   WBC 4.9 12/21/2022   HGB 9.5 (L) 12/21/2022   HCT 30.1 (L) 12/21/2022   MCV 75.4 (L) 12/21/2022   PLT 624 (H) 12/21/2022   Recent Labs    11/30/22 1306 12/07/22 0941 12/21/22 1401  NA 136 135 136  K 3.3* 3.4* 3.5  CL 101 104 104  CO2 24 22 25   GLUCOSE 116* 197* 169*  BUN 9 12 13   CREATININE 0.91 0.97 1.01*  CALCIUM 8.8* 8.8* 9.0  GFRNONAA >60 >60 >60  PROT 7.3 7.5 7.6  ALBUMIN 3.8 3.6 3.8  AST 15 24 22   ALT 10 15 19   ALKPHOS 73 77 86  BILITOT 0.6 0.3 0.5   Component Ref Range & Units 1 mo ago (06/25/22) 4 mo ago (03/26/22) 6 mo ago (01/22/22) 8 mo ago (11/23/21) 1 yr ago (06/23/21) 1 yr ago (04/20/21) 1 yr ago (01/23/21)  CA 19-9 0 - 35 U/mL 24 21 CM 17 CM 12 CM 14 CM 13 CM 16 CM     RADIOGRAPHIC STUDIES: I have personally reviewed the radiological images as listed and agreed with the findings in the report. No results found.  ASSESSMENT & PLAN:   Cancer of head of pancreas (HCC) # AUG 2024- STAGE IV/METASTATIC- Recurrent Pancreas adenocarcinoma [2021-Stage IB- s/p neoadjuvant FOLFIRINX. #10 cycles]-s/p Whipple's [on April 19th 2022]- ypT2ypN0.  AUG 18th, 2024- PET scan- Whipple procedure with abnormal hypermetabolism centered at the pancreaticojejunostomy, worrisome for disease recurrence. Associated pancreaticojejunostomy stent in place; Hypermetabolic adjacent retroperitoneal lymph nodes, worrisome for metastatic disease. Ca 19-9 rising.  NGS-tempus- pending.  # Reviewed with the patient regarding concerns for recurrent pancreatic cancer-given the imaging findings and also rising CA 19-9. Reviewed  imaging at tumor  conference.Discussed with Dr.Byerley.   # proceed with chemo gemcitabine cycle #2- day 1 today. Labs-CBC/chemistries were reviewed with the patient. Will reepat scan after 3-4 cycles. Monitor ca 19-9 for now.   # Anemia- microcytic- Stable 9-10; NO IDA- ;  colo [EUS- 2021; oct 2022- colo; Dr.Anna]- stable.    # Severe hypokalemia potassium 2.8-.  Potassium 3.5. Continue K-Dur 20 mEq twice daily. Stable.    # PN--1-2- gapapetin increased to 600 mg-not well controlled;  recommend to start cymbalta-  # Hypertension-poorly controlled 180s]; Continue Norvasc/Metoprlol.[ per pt At home- 130/70s ;AGAIN RE-ITERATED to bring log. stable.  # mediport:  continue port flushes q 2-21M.stable.   # Anxiety/depression/insomnia: s/p Josh Borders- re: cymblata  # DISPOSITION:  # chemo today As per IS-  # # follow up in 1 week-  labs-port-cbc/cmp; gem chemo - # follow up in 3 week- MD; labs-port-cbc/cmp; Ca 19-9- gem chemo # follow up in 4  week-  labs-port-cbc/cmp; gem chemo -Dr.B     Earna Coder, MD 12/21/2022

## 2022-12-21 NOTE — Progress Notes (Signed)
Patient has no concerns today. 

## 2022-12-21 NOTE — Assessment & Plan Note (Addendum)
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 #10 cycles]-s/p Whipple's [on April 19th 2022]- ypT2ypN0.  AUG 18th, 2024- PET scan- Whipple procedure with abnormal hypermetabolism centered at the pancreaticojejunostomy, worrisome for disease recurrence. Associated pancreaticojejunostomy stent in place; Hypermetabolic adjacent retroperitoneal lymph nodes, worrisome for metastatic disease. Ca 19-9 rising.  NGS-tempus- pending.  # Reviewed with the patient regarding concerns for recurrent pancreatic cancer-given the imaging findings and also rising CA 19-9. Reviewed imaging at tumor conference.Discussed with Dr.Byerley.   # proceed with chemo gemcitabine cycle #2- day 1 today. Labs-CBC/chemistries were reviewed with the patient. Will reepat scan after 3-4 cycles. Monitor ca 19-9 for now.   # Anemia- microcytic- Stable 9-10; NO IDA- ;  colo [EUS- 2021; oct 2022- colo; Dr.Anna]- stable.    # Severe hypokalemia potassium 2.8-.  Potassium 3.5. Continue K-Dur 20 mEq twice daily. Stable.    # PN--1-2- gapapetin increased to 600 mg-not well controlled;  recommend to start cymbalta-  # Hypertension-poorly controlled 180s]; Continue Norvasc/Metoprlol.[ per pt At home- 130/70s ;AGAIN RE-ITERATED to bring log. stable.  # mediport:  continue port flushes q 2-35M.stable.   # Anxiety/depression/insomnia: s/p Josh Borders- re: cymblata  # DISPOSITION:  # chemo today As per IS-  # # follow up in 1 week-  labs-port-cbc/cmp; gem chemo - # follow up in 3 week- MD; labs-port-cbc/cmp; Ca 19-9- gem chemo # follow up in 4  week-  labs-port-cbc/cmp; gem chemo -Dr.B

## 2022-12-21 NOTE — Patient Instructions (Signed)
[facility]  Discharge Instructions: Thank you for choosing [facility] to provide your oncology and hematology care.  If you have a lab appointment with the Cancer Center, please go directly to the Cancer Center and check in at the registration area.  Wear comfortable clothing and clothing appropriate for easy access to any Portacath or PICC line.   We strive to give you quality time with your provider. You may need to reschedule your appointment if you arrive late (15 or more minutes).  Arriving late affects you and other patients whose appointments are after yours.  Also, if you miss three or more appointments without notifying the office, you may be dismissed from the clinic at the provider's discretion.      For prescription refill requests, have your pharmacy contact our office and allow 72 hours for refills to be completed.    Today you received the following chemotherapy and/or immunotherapy agents gemzar      To help prevent nausea and vomiting after your treatment, we encourage you to take your nausea medication as directed.  BELOW ARE SYMPTOMS THAT SHOULD BE REPORTED IMMEDIATELY: *FEVER GREATER THAN 100.4 F (38 C) OR HIGHER *CHILLS OR SWEATING *NAUSEA AND VOMITING THAT IS NOT CONTROLLED WITH YOUR NAUSEA MEDICATION *UNUSUAL SHORTNESS OF BREATH *UNUSUAL BRUISING OR BLEEDING *URINARY PROBLEMS (pain or burning when urinating, or frequent urination) *BOWEL PROBLEMS (unusual diarrhea, constipation, pain near the anus) TENDERNESS IN MOUTH AND THROAT WITH OR WITHOUT PRESENCE OF ULCERS (sore throat, sores in mouth, or a toothache) UNUSUAL RASH, SWELLING OR PAIN  UNUSUAL VAGINAL DISCHARGE OR ITCHING   Items with * indicate a potential emergency and should be followed up as soon as possible or go to the Emergency Department if any problems should occur.  Please show the CHEMOTHERAPY ALERT CARD or IMMUNOTHERAPY ALERT CARD at check-in to the  Emergency Department and triage nurse.  Should you have questions after your visit or need to cancel or reschedule your appointment, please contact [facility]  [phone] and follow the prompts.  Office hours are 8:00 a.m. to 4:30 p.m. Monday - Friday. Please note that voicemails left after 4:00 p.m. may not be returned until the following business day.  We are closed weekends and major holidays. You have access to a nurse at all times for urgent questions. Please call the main number to the clinic [phone] and follow the prompts.  For any non-urgent questions, you may also contact your provider using MyChart. We now offer e-Visits for anyone 65 and older to request care online for non-urgent symptoms. For details visit [email or web address].   Also download the MyChart app! Go to the app store, search "MyChart", open the app, select [facility], and log in with your MyChart username and password.

## 2022-12-22 LAB — CANCER ANTIGEN 19-9: CA 19-9: 87 U/mL — ABNORMAL HIGH (ref 0–35)

## 2022-12-27 ENCOUNTER — Encounter: Payer: Self-pay | Admitting: Internal Medicine

## 2022-12-28 ENCOUNTER — Inpatient Hospital Stay: Payer: 59 | Attending: Internal Medicine

## 2022-12-28 ENCOUNTER — Encounter: Payer: Self-pay | Admitting: Internal Medicine

## 2022-12-28 ENCOUNTER — Inpatient Hospital Stay: Payer: 59

## 2022-12-28 VITALS — BP 148/67 | HR 70 | Temp 97.0°F | Resp 18 | Wt 180.0 lb

## 2022-12-28 DIAGNOSIS — F32A Depression, unspecified: Secondary | ICD-10-CM | POA: Diagnosis not present

## 2022-12-28 DIAGNOSIS — F419 Anxiety disorder, unspecified: Secondary | ICD-10-CM | POA: Insufficient documentation

## 2022-12-28 DIAGNOSIS — C250 Malignant neoplasm of head of pancreas: Secondary | ICD-10-CM | POA: Diagnosis present

## 2022-12-28 DIAGNOSIS — I10 Essential (primary) hypertension: Secondary | ICD-10-CM | POA: Insufficient documentation

## 2022-12-28 DIAGNOSIS — Z5111 Encounter for antineoplastic chemotherapy: Secondary | ICD-10-CM | POA: Insufficient documentation

## 2022-12-28 DIAGNOSIS — E876 Hypokalemia: Secondary | ICD-10-CM | POA: Diagnosis not present

## 2022-12-28 DIAGNOSIS — D509 Iron deficiency anemia, unspecified: Secondary | ICD-10-CM | POA: Diagnosis not present

## 2022-12-28 DIAGNOSIS — G629 Polyneuropathy, unspecified: Secondary | ICD-10-CM | POA: Diagnosis not present

## 2022-12-28 DIAGNOSIS — G4700 Insomnia, unspecified: Secondary | ICD-10-CM | POA: Diagnosis not present

## 2022-12-28 LAB — CBC WITH DIFFERENTIAL (CANCER CENTER ONLY)
Abs Immature Granulocytes: 0.04 10*3/uL (ref 0.00–0.07)
Basophils Absolute: 0 10*3/uL (ref 0.0–0.1)
Basophils Relative: 0 %
Eosinophils Absolute: 0 10*3/uL (ref 0.0–0.5)
Eosinophils Relative: 0 %
HCT: 27.4 % — ABNORMAL LOW (ref 36.0–46.0)
Hemoglobin: 8.9 g/dL — ABNORMAL LOW (ref 12.0–15.0)
Immature Granulocytes: 1 %
Lymphocytes Relative: 53 %
Lymphs Abs: 2.1 10*3/uL (ref 0.7–4.0)
MCH: 24.3 pg — ABNORMAL LOW (ref 26.0–34.0)
MCHC: 32.5 g/dL (ref 30.0–36.0)
MCV: 74.9 fL — ABNORMAL LOW (ref 80.0–100.0)
Monocytes Absolute: 0.3 10*3/uL (ref 0.1–1.0)
Monocytes Relative: 7 %
Neutro Abs: 1.6 10*3/uL — ABNORMAL LOW (ref 1.7–7.7)
Neutrophils Relative %: 39 %
Platelet Count: 411 10*3/uL — ABNORMAL HIGH (ref 150–400)
RBC: 3.66 MIL/uL — ABNORMAL LOW (ref 3.87–5.11)
RDW: 17.3 % — ABNORMAL HIGH (ref 11.5–15.5)
WBC Count: 4 10*3/uL (ref 4.0–10.5)
nRBC: 0.5 % — ABNORMAL HIGH (ref 0.0–0.2)

## 2022-12-28 LAB — CMP (CANCER CENTER ONLY)
ALT: 32 U/L (ref 0–44)
AST: 32 U/L (ref 15–41)
Albumin: 3.6 g/dL (ref 3.5–5.0)
Alkaline Phosphatase: 84 U/L (ref 38–126)
Anion gap: 10 (ref 5–15)
BUN: 11 mg/dL (ref 8–23)
CO2: 26 mmol/L (ref 22–32)
Calcium: 9.1 mg/dL (ref 8.9–10.3)
Chloride: 99 mmol/L (ref 98–111)
Creatinine: 0.88 mg/dL (ref 0.44–1.00)
GFR, Estimated: >60 mL/min (ref >60)
Glucose, Bld: 114 mg/dL — ABNORMAL HIGH (ref 70–99)
Potassium: 3.7 mmol/L (ref 3.5–5.1)
Sodium: 135 mmol/L (ref 135–145)
Total Bilirubin: 0.3 mg/dL (ref 0.3–1.2)
Total Protein: 7.2 g/dL (ref 6.5–8.1)

## 2022-12-28 MED ORDER — SODIUM CHLORIDE 0.9 % IV SOLN
Freq: Once | INTRAVENOUS | Status: AC
  Filled 2022-12-28: qty 250

## 2022-12-28 MED ORDER — PROCHLORPERAZINE MALEATE 10 MG PO TABS
10.0000 mg | ORAL_TABLET | Freq: Once | ORAL | Status: AC
  Administered 2022-12-28: 10 mg via ORAL

## 2022-12-28 MED ORDER — HEPARIN SOD (PORK) LOCK FLUSH 100 UNIT/ML IV SOLN
500.0000 [IU] | Freq: Once | INTRAVENOUS | Status: AC | PRN
  Administered 2022-12-28: 500 [IU]
  Filled 2022-12-28: qty 5

## 2022-12-28 MED ORDER — SODIUM CHLORIDE 0.9 % IV SOLN
1000.0000 mg/m2 | Freq: Once | INTRAVENOUS | Status: AC
  Administered 2022-12-28: 1900 mg via INTRAVENOUS
  Filled 2022-12-28: qty 49.97

## 2022-12-28 NOTE — Progress Notes (Signed)
Nutrition Follow-up:   Patient with recurrent stage IV pancreatic cancer.  Patient receiving gemcitabine.  Met with patient during infusion. Reports that appetite was decreased for about a week following treatment then started to pick back up.  Has been eating eggs and cheese, fish, biscuits and gravy.  Ensure shakes cause gas.  Drinks lactaid milk.  Had one episode of nausea and took medication which helped.  No constipation or diarrhea    Medications: reviewed  Labs: reviewed  Anthropometrics:   Weight 180 lb 3.2 oz on 9/24 181 lb 5.3 oz on 12/07/22  Stable weight    NUTRITION DIAGNOSIS: none at this time    INTERVENTION:  Continue antiemetic to control nausea Encouraged good sources of protein and weight maintenance     MONITORING, EVALUATION, GOAL: weight trends, intake   NEXT VISIT: as needed  Emmarie Sannes B. Freida Busman, RD, LDN Registered Dietitian [phone]

## 2022-12-28 NOTE — Patient Instructions (Signed)

## 2022-12-30 ENCOUNTER — Inpatient Hospital Stay (HOSPITAL_BASED_OUTPATIENT_CLINIC_OR_DEPARTMENT_OTHER): Payer: 59 | Admitting: Hospice and Palliative Medicine

## 2022-12-30 DIAGNOSIS — C250 Malignant neoplasm of head of pancreas: Secondary | ICD-10-CM

## 2022-12-30 DIAGNOSIS — Z515 Encounter for palliative care: Secondary | ICD-10-CM | POA: Diagnosis not present

## 2022-12-30 DIAGNOSIS — G629 Polyneuropathy, unspecified: Secondary | ICD-10-CM | POA: Diagnosis not present

## 2022-12-30 NOTE — Progress Notes (Signed)
Virtual Visit via Telephone Note  I connected with [name] on 12/30/22 at  3:20 PM EDT by telephone and verified that I am speaking with the correct person using two identifiers.  Location: Patient: Home Provider: Clinic   I discussed the limitations, risks, security and privacy concerns of performing an evaluation and management service by telephone and the availability of in person appointments. I also discussed with the patient that there may be a patient responsible charge related to this service. The patient expressed understanding and agreed to proceed.   History of Present Illness: GERARDO Daniels is a 67 y.o. 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  Patient has had depression and anxiety.  She was referred to palliative care to address goals and manage ongoing symptoms.    Observations/Objective: I called and spoke with patient by phone.  She reports she is doing well.  Denies any significant changes or concerns.  She overall feels like she is doing better.  She says that she has started the duloxetine and that has helped the neuropathy.  Denies pain elsewhere.  Reports good appetite and stable performance status.  No other symptomatic complaints or concerns.  Was having difficulty with insomnia but says that that is also improved.  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 The patient was provided an opportunity to ask questions and all were answered. The patient agreed with the plan and demonstrated an understanding of the instructions.   The patient was advised to call back or seek an in-person evaluation if the symptoms worsen or if the condition fails to improve as  anticipated.  I provided 10 minutes of non-face-to-face time during this encounter.   Malachy Moan, NP

## 2023-01-07 NOTE — Progress Notes (Signed)
[facility] spoke with this patient and she declined to enroll in our program.   [facility] services remain avail for this patient should she express interest or need in the future.

## 2023-01-11 ENCOUNTER — Inpatient Hospital Stay: Payer: 59

## 2023-01-11 ENCOUNTER — Encounter: Payer: Self-pay | Admitting: Nurse Practitioner

## 2023-01-11 ENCOUNTER — Inpatient Hospital Stay (HOSPITAL_BASED_OUTPATIENT_CLINIC_OR_DEPARTMENT_OTHER): Payer: 59 | Admitting: Nurse Practitioner

## 2023-01-11 VITALS — BP 150/78 | HR 75 | Temp 97.6°F | Resp 18 | Ht 64.0 in | Wt 176.2 lb

## 2023-01-11 DIAGNOSIS — C250 Malignant neoplasm of head of pancreas: Secondary | ICD-10-CM

## 2023-01-11 DIAGNOSIS — Z5111 Encounter for antineoplastic chemotherapy: Secondary | ICD-10-CM

## 2023-01-11 LAB — CBC WITH DIFFERENTIAL (CANCER CENTER ONLY)
Abs Immature Granulocytes: 0.03 10*3/uL (ref 0.00–0.07)
Basophils Absolute: 0 10*3/uL (ref 0.0–0.1)
Basophils Relative: 0 %
Eosinophils Absolute: 0 10*3/uL (ref 0.0–0.5)
Eosinophils Relative: 1 %
HCT: 28 % — ABNORMAL LOW (ref 36.0–46.0)
Hemoglobin: 8.9 g/dL — ABNORMAL LOW (ref 12.0–15.0)
Immature Granulocytes: 1 %
Lymphocytes Relative: 29 %
Lymphs Abs: 1.4 10*3/uL (ref 0.7–4.0)
MCH: 24.2 pg — ABNORMAL LOW (ref 26.0–34.0)
MCHC: 31.8 g/dL (ref 30.0–36.0)
MCV: 76.1 fL — ABNORMAL LOW (ref 80.0–100.0)
Monocytes Absolute: 0.3 10*3/uL (ref 0.1–1.0)
Monocytes Relative: 7 %
Neutro Abs: 3 10*3/uL (ref 1.7–7.7)
Neutrophils Relative %: 62 %
Platelet Count: 449 10*3/uL — ABNORMAL HIGH (ref 150–400)
RBC: 3.68 MIL/uL — ABNORMAL LOW (ref 3.87–5.11)
RDW: 19.4 % — ABNORMAL HIGH (ref 11.5–15.5)
WBC Count: 4.7 10*3/uL (ref 4.0–10.5)
nRBC: 0 % (ref 0.0–0.2)

## 2023-01-11 LAB — CMP (CANCER CENTER ONLY)
ALT: 17 U/L (ref 0–44)
AST: 23 U/L (ref 15–41)
Albumin: 3.6 g/dL (ref 3.5–5.0)
Alkaline Phosphatase: 81 U/L (ref 38–126)
Anion gap: 9 (ref 5–15)
BUN: 10 mg/dL (ref 8–23)
CO2: 24 mmol/L (ref 22–32)
Calcium: 8.7 mg/dL — ABNORMAL LOW (ref 8.9–10.3)
Chloride: 102 mmol/L (ref 98–111)
Creatinine: 0.88 mg/dL (ref 0.44–1.00)
GFR, Estimated: >60 mL/min (ref >60)
Glucose, Bld: 143 mg/dL — ABNORMAL HIGH (ref 70–99)
Potassium: 3.3 mmol/L — ABNORMAL LOW (ref 3.5–5.1)
Sodium: 135 mmol/L (ref 135–145)
Total Bilirubin: 0.3 mg/dL (ref 0.3–1.2)
Total Protein: 7.3 g/dL (ref 6.5–8.1)

## 2023-01-11 MED ORDER — PROCHLORPERAZINE MALEATE 10 MG PO TABS
10.0000 mg | ORAL_TABLET | Freq: Once | ORAL | Status: AC
  Administered 2023-01-11: 10 mg via ORAL
  Filled 2023-01-11: qty 1

## 2023-01-11 MED ORDER — SODIUM CHLORIDE 0.9 % IV SOLN
Freq: Once | INTRAVENOUS | Status: AC
  Filled 2023-01-11: qty 250

## 2023-01-11 MED ORDER — HEPARIN SOD (PORK) LOCK FLUSH 100 UNIT/ML IV SOLN
500.0000 [IU] | Freq: Once | INTRAVENOUS | Status: DC | PRN
  Filled 2023-01-11: qty 5

## 2023-01-11 MED ORDER — SODIUM CHLORIDE 0.9 % IV SOLN
1000.0000 mg/m2 | Freq: Once | INTRAVENOUS | Status: AC
  Administered 2023-01-11: 1900 mg via INTRAVENOUS
  Filled 2023-01-11: qty 49.97

## 2023-01-11 NOTE — Progress Notes (Signed)
[facility] CONSULT NOTE  Patient Care Team: Entzminger, Danton Clap, MD as PCP - General (Internal Medicine) Toy Cookey, FNP (Family Medicine) Jim Like, RN as Registered Nurse Scarlett Presto, RN (Inactive) as Registered Nurse Benita Gutter, RN as Oncology Nurse Navigator Almond Lint, MD as Consulting Physician (General Surgery) Earna Coder, MD as Consulting Physician (Internal Medicine)  CHIEF COMPLAINTS/PURPOSE OF CONSULTATION: pancreas adenocarcinoma  Oncology History Overview Note  #Pancreas adenocarcinoma-Stage IB- [id number]-[EUS- Dr.Spaete; [facility]/GI; mass 22x8mm; invading/abutting superior mesenteric vein; 2 enlarged lymph nodes peripancreatic/porta hepatis largest 10 x 5.8 mm-nonpathologic based on EUS criteria/no biopsy]-borderline resectable.  PET scan no evidence of distant metastatic disease.  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 Fredirick Maudlin September]  # 01/02/2020-FOLFIRINOX; Neulasta. S/p FOLFIRINOX 10 cycles- April 19th whipples-  s/p neoadjuvant FOLFIRINX. #10 cycles]-s/p Whipple's [on April 19th 2022]- ypT2 [3.5cm]; pN0/11;   DIS-CONTINUED  FOLFIRINOX cycle #11 & 12-  Sec to PN-2-3.  PANCREAS (EXOCRINE), CARCINOMA: Resection  Procedure: Whipple procedure  Tumor Site: Head of pancreas.  Tumor Size: 3.5 cm, slide measurement.  Histologic Type: Pancreatic ductal adenocarcinoma.  Histologic Grade: Moderately differentiated.  Tumor Extension: Into peripancreatic connective tissue.  Treatment Effect: Moderate treatment effect.  Lymphovascular Invasion: Not identified.  Perineural Invasion: Present.  Margins: All surgical margins are negative for carcinoma.  Regional Lymph Nodes:       Number of Lymph Nodes with Tumor: 0       Number of Lymph Nodes Examined: 15  Distant Metastasis:       Distant  Site(s) Involved: Not applicable.  Pathologic Stage Classification (pTNM, AJCC 8th Edition): ypT2, ypN0   # Borderline DM; HTN   AUG 18th, 2024- PET scan- Whipple procedure with abnormal hypermetabolism centered at the pancreaticojejunostomy, worrisome for disease recurrence. Associated pancreaticojejunostomy stent in place; Hypermetabolic adjacent retroperitoneal lymph nodes, worrisome for metastatic disease. Ca 19-9 rising.    SEP 3rd, 2024- single gem [3 weeks on 1 week off]-because of neuropathy.    Cancer of head of pancreas (HCC)  12/04/2019 Initial Diagnosis   Cancer of head of pancreas (HCC)   12/19/2019 - 12/19/2019 Chemotherapy   The patient had PACLitaxel-protein bound (ABRAXANE) chemo infusion 250 mg, 125 mg/m2 = 250 mg, Intravenous,  Once, 1 of 4 cycles Administration: 250 mg (12/19/2019) gemcitabine (GEMZAR) 2,000 mg in sodium chloride 0.9 % 250 mL chemo infusion, 1,976 mg, Intravenous,  Once, 1 of 4 cycles Administration: 2,000 mg (12/19/2019)  for chemotherapy treatment.    01/02/2020 - 05/15/2020 Chemotherapy   Patient is on Treatment Plan : PANCREAS Modified FOLFIRINOX q14d x 4 cycles     11/30/2022 -  Chemotherapy   Patient is on Treatment Plan : PANCREAS Gemcitabine D1,8, (1000) q21d      HISTORY OF PRESENTING ILLNESS: Patient ambulating-independently.  Alone.   Robin Daniels 67 y.o.  female history recurrent stage IV  pancreatic adeno ca  is here for follow-up- and consideration of gemcitabine chemotherapy. Tolerating treatment well. She is symptomatically unchanged. Moving bowels normally. Appetite is reduced but weight is generally stable. Numbness and tingling is somewhat improved with duloxetine.    Review of Systems  Constitutional:  Negative for chills, diaphoresis, fever and malaise/fatigue.  HENT:  Negative for nosebleeds and sore throat.   Eyes:  Negative for double vision.  Respiratory:  Negative for cough, hemoptysis, sputum production, shortness of breath  and  wheezing.   Cardiovascular:  Negative for chest pain, palpitations, orthopnea and leg swelling.  Gastrointestinal:  Negative for abdominal pain, blood in stool, constipation, diarrhea, heartburn, melena, nausea and vomiting.  Genitourinary:  Negative for dysuria, frequency and urgency.  Musculoskeletal:  Negative for back pain and joint pain.  Skin: Negative.  Negative for itching and rash.  Neurological:  Positive for tingling. Negative for dizziness, focal weakness, weakness and headaches.  Endo/Heme/Allergies:  Does not bruise/bleed easily.  Psychiatric/Behavioral:  Negative for depression. The patient is not nervous/anxious and does not have insomnia.      MEDICAL HISTORY:  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/04/2019   Complication of anesthesia    Fluttering heart    Hyperlipidemia    Hypertension    PONV (postoperative nausea and vomiting)    Pre-diabetes    Vertigo    none recently    SURGICAL HISTORY: Past Surgical History:  Procedure Laterality Date   CATARACT EXTRACTION W/PHACO Right 06/09/2021   Procedure: CATARACT EXTRACTION PHACO AND INTRAOCULAR LENS PLACEMENT (IOC) RIGHT malyugin;  Surgeon: Galen Manila, MD;  Location[facility];  Service: Ophthalmology;  Laterality: Right;  12.40 1:07.0   COLONOSCOPY WITH PROPOFOL N/A 01/13/2021   Procedure: COLONOSCOPY WITH PROPOFOL;  Surgeon: Wyline Mood, MD;  Location: [facility] ENDOSCOPY;  Service: Gastroenterology;  Laterality: N/A;   ENDOSCOPIC RETROGRADE CHOLANGIOPANCREATOGRAPHY (ERCP) WITH PROPOFOL N/A 11/16/2019   Procedure: ENDOSCOPIC RETROGRADE CHOLANGIOPANCREATOGRAPHY (ERCP) WITH PROPOFOL;  Surgeon: Midge Minium, MD;  Location: [facility] ENDOSCOPY;  Service: Endoscopy;  Laterality: N/A;   ERCP N/A 03/27/2020   Procedure: ENDOSCOPIC RETROGRADE CHOLANGIOPANCREATOGRAPHY (ERCP);  Surgeon: Midge Minium, MD;  Location:  [facility] ENDOSCOPY;  Service: Endoscopy;  Laterality: N/A;   EUS N/A 11/29/2019   Procedure: FULL UPPER ENDOSCOPIC ULTRASOUND (EUS) RADIAL;  Surgeon: Doren Custard, MD;  Location: [facility] ENDOSCOPY;  Service: Gastroenterology;  Laterality: N/A;   IR FLUORO RM 30-60 MIN  07/30/2020   IR IMAGING GUIDED PORT INSERTION  12/14/2019   JEJUNOSTOMY Left 07/15/2020   Procedure: Rance Muir;  Surgeon: Almond Lint, MD;  Location: [facility] OR;  Service: General;  Laterality: Left;   LAPAROSCOPY N/A 07/15/2020   Procedure: DIAGNOSTIC LAPAROSCOPY;  Surgeon: Almond Lint, MD;  Location: [facility] OR;  Service: General;  Laterality: N/A;   right knee replacement Right [id number]   SHOULDER ARTHROSCOPY W/ ROTATOR CUFF REPAIR Right    WHIPPLE PROCEDURE N/A 07/15/2020   Procedure: WHIPPLE PROCEDURE;  Surgeon: Almond Lint, MD;  Location[facility] OR;  Service: General;  Laterality: N/A;    SOCIAL HISTORY: Social History   Socioeconomic History   Marital status: Divorced    Spouse name: Not on file   Number of children: Not on file   Years of education: Not on file   Highest education level: Not on file  Occupational History   Not on file  Tobacco Use   Smoking status: Never   Smokeless tobacco: Never  Vaping Use   Vaping status: Never Used  Substance and Sexual Activity   Alcohol use: No    Alcohol/week: 0.0 standard drinks of alcohol   Drug use: No   Sexual activity: Never  Other Topics Concern   Not on file  Social History Narrative   ** Merged History Encounter **       Lives in [place]; with mom and son; worked in dietary; never smoked;  no alcohol.    Social Determinants of Health   Financial Resource Strain: Low Risk  (12/01/2022)   Overall Financial Resource Strain (CARDIA)    Difficulty of Paying Living Expenses: Not hard at all  Food Insecurity: No Food Insecurity (12/01/2022)   Hunger Vital Sign    Worried About Running Out of Food in the Last Year: Never true    Ran Out of Food in the Last Year: Never  true  Transportation Needs: No Transportation Needs (12/01/2022)   PRAPARE - Administrator, Civil Service (Medical): No    Lack of Transportation (Non-Medical): No  Physical Activity: Not on file  Stress: Not on file  Social Connections: Not on file  Intimate Partner Violence: Not At Risk (12/01/2022)   Humiliation, Afraid, Rape, and Kick questionnaire    Fear of Current or Ex-Partner: No    Emotionally Abused: No    Physically Abused: No    Sexually Abused: No    FAMILY HISTORY: Family History  Problem Relation Age of Onset   Diabetes Mother    Hyperlipidemia Mother    Hypertension Mother    Vision loss Mother    Arthritis Father    Hyperlipidemia Father    Hypertension Father    Breast cancer Maternal Aunt     ALLERGIES:  has No Known Allergies.  MEDICATIONS:  Current Outpatient Medications  Medication Sig Dispense Refill   amLODipine (NORVASC) 10 MG tablet Take 1 tablet (10 mg total) by mouth daily. 30 tablet 0   Calcium Carbonate-Vit D-Min (CALCIUM 600+D PLUS MINERALS PO) Take 1 tablet by mouth in the morning and at bedtime.     DULoxetine (CYMBALTA) 30 MG capsule Take 1 capsule (30 mg total) by mouth daily. 30 capsule 3   gabapentin (NEURONTIN) 300 MG capsule Take 300 mg by mouth daily.     lidocaine-prilocaine (EMLA) cream Apply on the port. 30 -45 min  prior to port access. 30 g 3   lisinopril (ZESTRIL) 10 MG tablet Take 10 mg by mouth daily.     metoprolol succinate (TOPROL-XL) 50 MG 24 hr tablet Take 1 tablet (50 mg total) by mouth daily. Take with or immediately following a meal. 30 tablet 0   potassium chloride SA (KLOR-CON M) 20 MEQ tablet Take 1 tablet (20 mEq total) by mouth 2 (two) times daily. 60 tablet 1   vitamin B-12 (CYANOCOBALAMIN) 1000 MCG tablet Take 1 tablet (1,000 mcg total) by mouth daily.     acetaminophen (TYLENOL) 325 MG tablet Take 650 mg by mouth every 6 (six) hours as needed for moderate pain.     ondansetron (ZOFRAN) 8 MG tablet One  pill every 8 hours as needed for nausea/vomitting. (Patient not taking: Reported on 01/11/2023) 40 tablet 1   prochlorperazine (COMPAZINE) 10 MG tablet Take 1 tablet (10 mg total) by mouth every 6 (six) hours as needed for nausea or vomiting. (Patient not taking: Reported on 01/11/2023) 40 tablet 1   No current facility-administered medications for this visit.   Facility-Administered Medications Ordered in Other Visits  Medication Dose Route Frequency Provider Last Rate Last Admin   sodium chloride flush (NS) 0.9 % injection 10 mL  10 mL Intravenous PRN Louretta Shorten R, MD   10 mL at 06/23/21 1012      PHYSICAL EXAMINATION: ECOG PERFORMANCE STATUS: 1 - Symptomatic but completely ambulatory  Vitals:   01/11/23 1058  BP: (!) 150/78  Pulse: 75  Resp: 18  Temp: 97.6 F (36.4  C)  SpO2: 99%   Filed Weights   01/11/23 1102  Weight: 176 lb 3.2 oz (79.9 kg)   Physical Exam Constitutional:      Appearance: She is not ill-appearing.  Eyes:     General: No scleral icterus.    Conjunctiva/sclera: Conjunctivae normal.  Cardiovascular:     Rate and Rhythm: Normal rate and regular rhythm.  Abdominal:     General: There is no distension.     Palpations: Abdomen is soft.     Tenderness: There is no abdominal tenderness. There is no guarding.  Musculoskeletal:        General: No deformity.     Right lower leg: No edema.     Left lower leg: No edema.  Lymphadenopathy:     Cervical: No cervical adenopathy.  Skin:    General: Skin is warm and dry.  Neurological:     Mental Status: She is alert and oriented to person, place, and time. Mental status is at baseline.  Psychiatric:        Mood and Affect: Mood normal.        Behavior: Behavior normal.    LABORATORY DATA:  I have reviewed the data as listed Lab Results  Component Value Date   WBC 4.7 01/11/2023   HGB 8.9 (L) 01/11/2023   HCT 28.0 (L) 01/11/2023   MCV 76.1 (L) 01/11/2023   PLT 449 (H) 01/11/2023   Recent Labs     12/21/22 1401 12/28/22 1437 01/11/23 1035  NA 136 135 135  K 3.5 3.7 3.3*  CL 104 99 102  CO2 25 26 24   GLUCOSE 169* 114* 143*  BUN 13 11 10   CREATININE 1.01* 0.88 0.88  CALCIUM 9.0 9.1 8.7*  GFRNONAA >60 >60 >60  PROT 7.6 7.2 7.3  ALBUMIN 3.8 3.6 3.6  AST 22 32 23  ALT 19 32 17  ALKPHOS 86 84 81  BILITOT 0.5 0.3 0.3   Component Ref Range & Units 3 wk ago 1 mo ago 2 mo ago 3 mo ago 6 mo ago 9 mo ago 11 mo ago  CA 19-9 0 - 35 U/mL 87 High  116 High  CM 88 High  CM 65 High  CM 24 CM 21 CM 17      RADIOGRAPHIC STUDIES: I have personally reviewed the radiological images as listed and agreed with the findings in the report. No results found.  ASSESSMENT & PLAN:   # Cancer of head of pancreas - AUG 2024- STAGE IV/METASTATIC- Recurrent Pancreas adenocarcinoma [2021-Stage IB- s/p neoadjuvant FOLFIRINX. #10 cycles]- s/p Whipple's [on April 19th 2022]- ypT2ypN0.  AUG 18th, 2024- PET scan- Whipple procedure with abnormal hypermetabolism centered at the pancreaticojejunostomy, worrisome for disease recurrence. Associated pancreaticojejunostomy stent in place; Hypermetabolic adjacent retroperitoneal lymph nodes, worrisome for metastatic disease. Ca 19-9 rising.  NGS-tempus- pending.  # Reviewed with the patient regarding concerns for recurrent pancreatic cancer-given the imaging findings and also rising CA 19-9. Reviewed imaging at tumor conference which was discussed with Dr. Rowan Blase.   # Currently s/p cycle 2 of gemcitabine chemotherapy. Labs today reviewed and acceptable for treatment. Proceed with cycle 3 of chemotherapy today. Plan to repeat scans before 4th cycle. Monitor CA 19-9. Results pending.    # Anemia- microcytic- No IDA. colo [EUS- 2021; oct 2022- colo; Dr.Anna]. Likely d/t chemotherapy. Hmg 8.9. Stable.    # Severe hypokalemia -potassium 2.8. On Kdur 20 meq twice daily. Today, 3.3. Improved. 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 Started on cymbalta  which is managed by palliative care. Improved.    # Hypertension-poorly controlled 180s]; Continue Norvasc/Metoprlol. Today, 150/78. Per patient, similar readings at home but later endorses sporadic checking. Has not created log. Encouraged her to log pressures and/or bring machine.    # Mediport:  continue port flushes q 2-15M.stable.    # Anxiety/depression/insomnia: s/p Josh Borders- re: cymbalta   DISPOSITION:  Chemo today 1 week- port/lab, gemzar 2 weeks- ct c/a/p 3 week- port/lab, Dr Donneta Romberg, gem chemo 4 weeks- port/lab, gem chemo- la  No problem-specific Assessment & Plan notes found for this encounter.  Alinda Dooms, NP 01/11/2023

## 2023-01-11 NOTE — Progress Notes (Signed)
Denies pain or nausea. Bowels are normal. Neuropathy is better. Appetite is fair. Up and down. No resp issues

## 2023-01-11 NOTE — Patient Instructions (Signed)
[place] [facility]  Discharge Instructions: Thank you for choosing [facility] to provide your oncology and hematology care.  If you have a lab appointment with the Cancer Center, please go directly to the Cancer Center and check in at the registration area.  Wear comfortable clothing and clothing appropriate for easy access to any Portacath or PICC line.   We strive to give you quality time with your provider. You may need to reschedule your appointment if you arrive late (15 or more minutes).  Arriving late affects you and other patients whose appointments are after yours.  Also, if you miss three or more appointments without notifying the office, you may be dismissed from the clinic at the provider's discretion.      For prescription refill requests, have your pharmacy contact our office and allow 72 hours for refills to be completed.    Today you received the following chemotherapy and/or immunotherapy agents Gemzar      To help prevent nausea and vomiting after your treatment, we encourage you to take your nausea medication as directed.  BELOW ARE SYMPTOMS THAT SHOULD BE REPORTED IMMEDIATELY: *FEVER GREATER THAN 100.4 F (38 C) OR HIGHER *CHILLS OR SWEATING *NAUSEA AND VOMITING THAT IS NOT CONTROLLED WITH YOUR NAUSEA MEDICATION *UNUSUAL SHORTNESS OF BREATH *UNUSUAL BRUISING OR BLEEDING *URINARY PROBLEMS (pain or burning when urinating, or frequent urination) *BOWEL PROBLEMS (unusual diarrhea, constipation, pain near the anus) TENDERNESS IN MOUTH AND THROAT WITH OR WITHOUT PRESENCE OF ULCERS (sore throat, sores in mouth, or a toothache) UNUSUAL RASH, SWELLING OR PAIN  UNUSUAL VAGINAL DISCHARGE OR ITCHING   Items with * indicate a potential emergency and should be followed up as soon as possible or go to the Emergency Department if any problems should occur.  Please show the CHEMOTHERAPY ALERT CARD or IMMUNOTHERAPY ALERT CARD at check-in to the  Emergency Department and triage nurse.  Should you have questions after your visit or need to cancel or reschedule your appointment, please contact [place] [facility]  [phone] and follow the prompts.  Office hours are 8:00 a.m. to 4:30 p.m. Monday - Friday. Please note that voicemails left after 4:00 p.m. may not be returned until the following business day.  We are closed weekends and major holidays. You have access to a nurse at all times for urgent questions. Please call the main number to the clinic [phone] and follow the prompts.  For any non-urgent questions, you may also contact your provider using MyChart. We now offer e-Visits for anyone 55 and older to request care online for non-urgent symptoms. For details visit [email or web address].   Also download the MyChart app! Go to the app store, search "MyChart", open the app, select [place], and log in with your MyChart username and password.

## 2023-01-12 LAB — CANCER ANTIGEN 19-9: CA 19-9: 88 U/mL — ABNORMAL HIGH (ref 0–35)

## 2023-01-17 ENCOUNTER — Encounter: Payer: Self-pay | Admitting: Internal Medicine

## 2023-01-18 ENCOUNTER — Other Ambulatory Visit: Payer: 59

## 2023-01-18 ENCOUNTER — Inpatient Hospital Stay: Payer: 59

## 2023-01-18 ENCOUNTER — Ambulatory Visit: Payer: 59

## 2023-01-18 ENCOUNTER — Ambulatory Visit: Payer: 59 | Admitting: Internal Medicine

## 2023-01-18 VITALS — BP 167/93 | HR 76 | Temp 96.0°F | Resp 17 | Wt 176.4 lb

## 2023-01-18 DIAGNOSIS — C250 Malignant neoplasm of head of pancreas: Secondary | ICD-10-CM

## 2023-01-18 DIAGNOSIS — Z5111 Encounter for antineoplastic chemotherapy: Secondary | ICD-10-CM | POA: Diagnosis not present

## 2023-01-18 LAB — CMP (CANCER CENTER ONLY)
ALT: 47 U/L — ABNORMAL HIGH (ref 0–44)
AST: 44 U/L — ABNORMAL HIGH (ref 15–41)
Albumin: 3.5 g/dL (ref 3.5–5.0)
Alkaline Phosphatase: 85 U/L (ref 38–126)
Anion gap: 7 (ref 5–15)
BUN: 9 mg/dL (ref 8–23)
CO2: 26 mmol/L (ref 22–32)
Calcium: 8.7 mg/dL — ABNORMAL LOW (ref 8.9–10.3)
Chloride: 101 mmol/L (ref 98–111)
Creatinine: 0.93 mg/dL (ref 0.44–1.00)
GFR, Estimated: >60 mL/min (ref >60)
Glucose, Bld: 137 mg/dL — ABNORMAL HIGH (ref 70–99)
Potassium: 3.4 mmol/L — ABNORMAL LOW (ref 3.5–5.1)
Sodium: 134 mmol/L — ABNORMAL LOW (ref 135–145)
Total Bilirubin: 0.3 mg/dL (ref 0.3–1.2)
Total Protein: 6.7 g/dL (ref 6.5–8.1)

## 2023-01-18 LAB — CBC WITH DIFFERENTIAL (CANCER CENTER ONLY)
Abs Immature Granulocytes: 0.02 10*3/uL (ref 0.00–0.07)
Basophils Absolute: 0 10*3/uL (ref 0.0–0.1)
Basophils Relative: 1 %
Eosinophils Absolute: 0 10*3/uL (ref 0.0–0.5)
Eosinophils Relative: 1 %
HCT: 25.6 % — ABNORMAL LOW (ref 36.0–46.0)
Hemoglobin: 8.3 g/dL — ABNORMAL LOW (ref 12.0–15.0)
Immature Granulocytes: 1 %
Lymphocytes Relative: 44 %
Lymphs Abs: 1.7 10*3/uL (ref 0.7–4.0)
MCH: 24.9 pg — ABNORMAL LOW (ref 26.0–34.0)
MCHC: 32.4 g/dL (ref 30.0–36.0)
MCV: 76.9 fL — ABNORMAL LOW (ref 80.0–100.0)
Monocytes Absolute: 0.3 10*3/uL (ref 0.1–1.0)
Monocytes Relative: 8 %
Neutro Abs: 1.8 10*3/uL (ref 1.7–7.7)
Neutrophils Relative %: 45 %
Platelet Count: 324 10*3/uL (ref 150–400)
RBC: 3.33 MIL/uL — ABNORMAL LOW (ref 3.87–5.11)
RDW: 19.5 % — ABNORMAL HIGH (ref 11.5–15.5)
WBC Count: 3.8 10*3/uL — ABNORMAL LOW (ref 4.0–10.5)
nRBC: 0 % (ref 0.0–0.2)

## 2023-01-18 LAB — IRON AND TIBC
Iron: 33 ug/dL (ref 28–170)
Saturation Ratios: 10 % — ABNORMAL LOW (ref 10.4–31.8)
TIBC: 329 ug/dL (ref 250–450)
UIBC: 296 ug/dL

## 2023-01-18 LAB — FERRITIN: Ferritin: 96 ng/mL (ref 11–307)

## 2023-01-18 MED ORDER — SODIUM CHLORIDE 0.9 % IV SOLN
Freq: Once | INTRAVENOUS | Status: AC
  Filled 2023-01-18: qty 250

## 2023-01-18 MED ORDER — SODIUM CHLORIDE 0.9% FLUSH
10.0000 mL | Freq: Once | INTRAVENOUS | Status: AC
  Administered 2023-01-18: 10 mL via INTRAVENOUS
  Filled 2023-01-18: qty 10

## 2023-01-18 MED ORDER — HEPARIN SOD (PORK) LOCK FLUSH 100 UNIT/ML IV SOLN
500.0000 [IU] | Freq: Once | INTRAVENOUS | Status: AC
  Administered 2023-01-18: 500 [IU] via INTRAVENOUS
  Filled 2023-01-18: qty 5

## 2023-01-18 MED ORDER — SODIUM CHLORIDE 0.9 % IV SOLN
1000.0000 mg/m2 | Freq: Once | INTRAVENOUS | Status: AC
  Administered 2023-01-18: 1900 mg via INTRAVENOUS
  Filled 2023-01-18: qty 49.97

## 2023-01-18 MED ORDER — SODIUM CHLORIDE 0.9% FLUSH
10.0000 mL | INTRAVENOUS | Status: DC | PRN
  Administered 2023-01-18: 10 mL
  Filled 2023-01-18: qty 10

## 2023-01-18 MED ORDER — PROCHLORPERAZINE MALEATE 10 MG PO TABS
10.0000 mg | ORAL_TABLET | Freq: Once | ORAL | Status: AC
  Administered 2023-01-18: 10 mg via ORAL
  Filled 2023-01-18: qty 1

## 2023-01-18 NOTE — Patient Instructions (Signed)
[facility]  Discharge Instructions: Thank you for choosing [facility] to provide your oncology and hematology care.  If you have a lab appointment with the Cancer Center, please go directly to the Cancer Center and check in at the registration area.  Wear comfortable clothing and clothing appropriate for easy access to any Portacath or PICC line.   We strive to give you quality time with your provider. You may need to reschedule your appointment if you arrive late (15 or more minutes).  Arriving late affects you and other patients whose appointments are after yours.  Also, if you miss three or more appointments without notifying the office, you may be dismissed from the clinic at the provider's discretion.      For prescription refill requests, have your pharmacy contact our office and allow 72 hours for refills to be completed.    Today you received the following chemotherapy and/or immunotherapy agents Gemzar       To help prevent nausea and vomiting after your treatment, we encourage you to take your nausea medication as directed.  BELOW ARE SYMPTOMS THAT SHOULD BE REPORTED IMMEDIATELY: *FEVER GREATER THAN 100.4 F (38 C) OR HIGHER *CHILLS OR SWEATING *NAUSEA AND VOMITING THAT IS NOT CONTROLLED WITH YOUR NAUSEA MEDICATION *UNUSUAL SHORTNESS OF BREATH *UNUSUAL BRUISING OR BLEEDING *URINARY PROBLEMS (pain or burning when urinating, or frequent urination) *BOWEL PROBLEMS (unusual diarrhea, constipation, pain near the anus) TENDERNESS IN MOUTH AND THROAT WITH OR WITHOUT PRESENCE OF ULCERS (sore throat, sores in mouth, or a toothache) UNUSUAL RASH, SWELLING OR PAIN  UNUSUAL VAGINAL DISCHARGE OR ITCHING   Items with * indicate a potential emergency and should be followed up as soon as possible or go to the Emergency Department if any problems should occur.  Please show the CHEMOTHERAPY ALERT CARD or IMMUNOTHERAPY ALERT CARD at check-in to  the Emergency Department and triage nurse.  Should you have questions after your visit or need to cancel or reschedule your appointment, please contact [facility]  [phone] and follow the prompts.  Office hours are 8:00 a.m. to 4:30 p.m. Monday - Friday. Please note that voicemails left after 4:00 p.m. may not be returned until the following business day.  We are closed weekends and major holidays. You have access to a nurse at all times for urgent questions. Please call the main number to the clinic [phone] and follow the prompts.  For any non-urgent questions, you may also contact your provider using MyChart. We now offer e-Visits for anyone 18 and older to request care online for non-urgent symptoms. For details visit [email or web address].   Also download the MyChart app! Go to the app store, search "MyChart", open the app, select [facility], and log in with your MyChart username and password.    

## 2023-01-20 ENCOUNTER — Other Ambulatory Visit: Payer: 59

## 2023-01-25 ENCOUNTER — Ambulatory Visit
Admission: RE | Admit: 2023-01-25 | Discharge: 2023-01-25 | Disposition: A | Discharge status: Home / Self Care | Payer: 59 | Source: Ambulatory Visit | Attending: Nurse Practitioner | Admitting: Nurse Practitioner

## 2023-01-25 ENCOUNTER — Other Ambulatory Visit: Payer: 59

## 2023-01-25 ENCOUNTER — Ambulatory Visit: Payer: 59

## 2023-01-25 DIAGNOSIS — C250 Malignant neoplasm of head of pancreas: Secondary | ICD-10-CM | POA: Insufficient documentation

## 2023-01-25 MED ORDER — IOHEXOL 300 MG/ML  SOLN
100.0000 mL | Freq: Once | INTRAMUSCULAR | Status: AC | PRN
  Administered 2023-01-25: 100 mL via INTRAVENOUS

## 2023-01-26 ENCOUNTER — Other Ambulatory Visit: Payer: 59

## 2023-01-26 ENCOUNTER — Ambulatory Visit: Payer: 59 | Admitting: Internal Medicine

## 2023-02-01 ENCOUNTER — Encounter: Payer: Self-pay | Admitting: Nurse Practitioner

## 2023-02-01 ENCOUNTER — Inpatient Hospital Stay (HOSPITAL_BASED_OUTPATIENT_CLINIC_OR_DEPARTMENT_OTHER): Payer: 59 | Admitting: Nurse Practitioner

## 2023-02-01 ENCOUNTER — Inpatient Hospital Stay: Payer: 59 | Attending: Internal Medicine

## 2023-02-01 ENCOUNTER — Inpatient Hospital Stay: Payer: 59

## 2023-02-01 VITALS — BP 174/85 | HR 99 | Temp 96.4°F | Wt 179.0 lb

## 2023-02-01 DIAGNOSIS — Z5111 Encounter for antineoplastic chemotherapy: Secondary | ICD-10-CM | POA: Insufficient documentation

## 2023-02-01 DIAGNOSIS — G629 Polyneuropathy, unspecified: Secondary | ICD-10-CM | POA: Diagnosis not present

## 2023-02-01 DIAGNOSIS — Z452 Encounter for adjustment and management of vascular access device: Secondary | ICD-10-CM | POA: Diagnosis not present

## 2023-02-01 DIAGNOSIS — D508 Other iron deficiency anemias: Secondary | ICD-10-CM | POA: Diagnosis not present

## 2023-02-01 DIAGNOSIS — C250 Malignant neoplasm of head of pancreas: Secondary | ICD-10-CM | POA: Diagnosis not present

## 2023-02-01 DIAGNOSIS — F32A Depression, unspecified: Secondary | ICD-10-CM | POA: Diagnosis not present

## 2023-02-01 DIAGNOSIS — F419 Anxiety disorder, unspecified: Secondary | ICD-10-CM | POA: Insufficient documentation

## 2023-02-01 DIAGNOSIS — I10 Essential (primary) hypertension: Secondary | ICD-10-CM | POA: Insufficient documentation

## 2023-02-01 DIAGNOSIS — Z79899 Other long term (current) drug therapy: Secondary | ICD-10-CM | POA: Insufficient documentation

## 2023-02-01 DIAGNOSIS — E876 Hypokalemia: Secondary | ICD-10-CM | POA: Diagnosis not present

## 2023-02-01 DIAGNOSIS — D509 Iron deficiency anemia, unspecified: Secondary | ICD-10-CM | POA: Insufficient documentation

## 2023-02-01 LAB — CMP (CANCER CENTER ONLY)
ALT: 27 U/L (ref 0–44)
AST: 27 U/L (ref 15–41)
Albumin: 3.5 g/dL (ref 3.5–5.0)
Alkaline Phosphatase: 90 U/L (ref 38–126)
Anion gap: 9 (ref 5–15)
BUN: 10 mg/dL (ref 8–23)
CO2: 22 mmol/L (ref 22–32)
Calcium: 8.3 mg/dL — ABNORMAL LOW (ref 8.9–10.3)
Chloride: 103 mmol/L (ref 98–111)
Creatinine: 0.91 mg/dL (ref 0.44–1.00)
GFR, Estimated: >60 mL/min (ref >60)
Glucose, Bld: 126 mg/dL — ABNORMAL HIGH (ref 70–99)
Potassium: 3.3 mmol/L — ABNORMAL LOW (ref 3.5–5.1)
Sodium: 134 mmol/L — ABNORMAL LOW (ref 135–145)
Total Bilirubin: 0.4 mg/dL (ref <1.2)
Total Protein: 6.9 g/dL (ref 6.5–8.1)

## 2023-02-01 LAB — CBC WITH DIFFERENTIAL (CANCER CENTER ONLY)
Abs Immature Granulocytes: 0.02 10*3/uL (ref 0.00–0.07)
Basophils Absolute: 0 10*3/uL (ref 0.0–0.1)
Basophils Relative: 0 %
Eosinophils Absolute: 0 10*3/uL (ref 0.0–0.5)
Eosinophils Relative: 1 %
HCT: 26.4 % — ABNORMAL LOW (ref 36.0–46.0)
Hemoglobin: 8.5 g/dL — ABNORMAL LOW (ref 12.0–15.0)
Immature Granulocytes: 0 %
Lymphocytes Relative: 32 %
Lymphs Abs: 1.6 10*3/uL (ref 0.7–4.0)
MCH: 25.3 pg — ABNORMAL LOW (ref 26.0–34.0)
MCHC: 32.2 g/dL (ref 30.0–36.0)
MCV: 78.6 fL — ABNORMAL LOW (ref 80.0–100.0)
Monocytes Absolute: 0.5 10*3/uL (ref 0.1–1.0)
Monocytes Relative: 10 %
Neutro Abs: 2.9 10*3/uL (ref 1.7–7.7)
Neutrophils Relative %: 57 %
Platelet Count: 390 10*3/uL (ref 150–400)
RBC: 3.36 MIL/uL — ABNORMAL LOW (ref 3.87–5.11)
RDW: 22.6 % — ABNORMAL HIGH (ref 11.5–15.5)
WBC Count: 5.1 10*3/uL (ref 4.0–10.5)
nRBC: 0 % (ref 0.0–0.2)

## 2023-02-01 MED ORDER — PROCHLORPERAZINE MALEATE 10 MG PO TABS
10.0000 mg | ORAL_TABLET | Freq: Once | ORAL | Status: AC
  Administered 2023-02-01: 10 mg via ORAL
  Filled 2023-02-01: qty 1

## 2023-02-01 MED ORDER — HEPARIN SOD (PORK) LOCK FLUSH 100 UNIT/ML IV SOLN
500.0000 [IU] | Freq: Once | INTRAVENOUS | Status: AC | PRN
  Administered 2023-02-01: 500 [IU]
  Filled 2023-02-01: qty 5

## 2023-02-01 MED ORDER — GEMCITABINE HCL CHEMO INJECTION 1 GM/26.3ML
1000.0000 mg/m2 | Freq: Once | INTRAVENOUS | Status: AC
  Administered 2023-02-01: 1900 mg via INTRAVENOUS
  Filled 2023-02-01: qty 49.97

## 2023-02-01 MED ORDER — SODIUM CHLORIDE 0.9 % IV SOLN
Freq: Once | INTRAVENOUS | Status: AC
  Filled 2023-02-01: qty 250

## 2023-02-01 MED ORDER — FERROUS BISGLYCINATE CHELATE 28 MG PO CAPS: 1.0000 | ORAL_CAPSULE | Freq: Every day | ORAL | Status: DC

## 2023-02-01 NOTE — Patient Instructions (Signed)
[facility] - A DEPT OF [facility]  Discharge Instructions: Thank you for choosing [facility] to provide your oncology and hematology care.  If you have a lab appointment with the Cancer Center, please go directly to the Cancer Center and check in at the registration area.  Wear comfortable clothing and clothing appropriate for easy access to any Portacath or PICC line.   We strive to give you quality time with your provider. You may need to reschedule your appointment if you arrive late (15 or more minutes).  Arriving late affects you and other patients whose appointments are after yours.  Also, if you miss three or more appointments without notifying the office, you may be dismissed from the clinic at the provider's discretion.      For prescription refill requests, have your pharmacy contact our office and allow 72 hours for refills to be completed.    Today you received the following chemotherapy and/or immunotherapy agents GEMZAR      To help prevent nausea and vomiting after your treatment, we encourage you to take your nausea medication as directed.  BELOW ARE SYMPTOMS THAT SHOULD BE REPORTED IMMEDIATELY: *FEVER GREATER THAN 100.4 F (38 C) OR HIGHER *CHILLS OR SWEATING *NAUSEA AND VOMITING THAT IS NOT CONTROLLED WITH YOUR NAUSEA MEDICATION *UNUSUAL SHORTNESS OF BREATH *UNUSUAL BRUISING OR BLEEDING *URINARY PROBLEMS (pain or burning when urinating, or frequent urination) *BOWEL PROBLEMS (unusual diarrhea, constipation, pain near the anus) TENDERNESS IN MOUTH AND THROAT WITH OR WITHOUT PRESENCE OF ULCERS (sore throat, sores in mouth, or a toothache) UNUSUAL RASH, SWELLING OR PAIN  UNUSUAL VAGINAL DISCHARGE OR ITCHING   Items with * indicate a potential emergency and should be followed up as soon as possible or go to the Emergency Department if any problems should occur.  Please show the CHEMOTHERAPY ALERT CARD or IMMUNOTHERAPY ALERT  CARD at check-in to the Emergency Department and triage nurse.  Should you have questions after your visit or need to cancel or reschedule your appointment, please contact [facility] - A DEPT OF [facility]  [phone] and follow the prompts.  Office hours are 8:00 a.m. to 4:30 p.m. Monday - Friday. Please note that voicemails left after 4:00 p.m. may not be returned until the following business day.  We are closed weekends and major holidays. You have access to a nurse at all times for urgent questions. Please call the main number to the clinic [phone] and follow the prompts.  For any non-urgent questions, you may also contact your provider using MyChart. We now offer e-Visits for anyone 79 and older to request care online for non-urgent symptoms. For details visit [email or web address].   Also download the MyChart app! Go to the app store, search "MyChart", open the app, select , and log in with your MyChart username and password.  Gemcitabine Injection What is this medication? GEMCITABINE (jem SYE ta been) treats some types of cancer. It works by slowing down the growth of cancer cells. This medicine may be used for other purposes; ask your health care provider or pharmacist if you have questions. COMMON BRAND NAME(S): Gemzar, Infugem What should I tell my care team before I take this medication? They need to know if you have any of these conditions: Blood disorders Infection Kidney disease Liver disease Lung or breathing disease, such as asthma or COPD Recent or ongoing radiation therapy An unusual or allergic reaction to gemcitabine, other  medications, foods, dyes, or preservatives If you or your partner are pregnant or trying to get pregnant Breast-feeding How should I use this medication? This medication is injected into a vein. It is given by your care team in a hospital or clinic setting. Talk to your care team about the use of  this medication in children. Special care may be needed. Overdosage: If you think you have taken too much of this medicine contact a poison control center or emergency room at once. NOTE: This medicine is only for you. Do not share this medicine with others. What if I miss a dose? Keep appointments for follow-up doses. It is important not to miss your dose. Call your care team if you are unable to keep an appointment. What may interact with this medication? Interactions have not been studied. This list may not describe all possible interactions. Give your health care provider a list of all the medicines, herbs, non-prescription drugs, or dietary supplements you use. Also tell them if you smoke, drink alcohol, or use illegal drugs. Some items may interact with your medicine. What should I watch for while using this medication? Your condition will be monitored carefully while you are receiving this medication. This medication may make you feel generally unwell. This is not uncommon, as chemotherapy can affect healthy cells as well as cancer cells. Report any side effects. Continue your course of treatment even though you feel ill unless your care team tells you to stop. In some cases, you may be given additional medications to help with side effects. Follow all directions for their use. This medication may increase your risk of getting an infection. Call your care team for advice if you get a fever, chills, sore throat, or other symptoms of a cold or flu. Do not treat yourself. Try to avoid being around people who are sick. This medication may increase your risk to bruise or bleed. Call your care team if you notice any unusual bleeding. Be careful brushing or flossing your teeth or using a toothpick because you may get an infection or bleed more easily. If you have any dental work done, tell your dentist you are receiving this medication. Avoid taking medications that contain aspirin, acetaminophen,  ibuprofen, naproxen, or ketoprofen unless instructed by your care team. These medications may hide a fever. Talk to your care team if you or your partner wish to become pregnant or think you might be pregnant. This medication can cause serious birth defects if taken during pregnancy and for 6 months after the last dose. A negative pregnancy test is required before starting this medication. A reliable form of contraception is recommended while taking this medication and for 6 months after the last dose. Talk to your care team about effective forms of contraception. Do not father a child while taking this medication and for 3 months after the last dose. Use a condom while having sex during this time period. Do not breastfeed while taking this medication and for at least 1 week after the last dose. This medication may cause infertility. Talk to your care team if you are concerned about your fertility. What side effects may I notice from receiving this medication? Side effects that you should report to your care team as soon as possible: Allergic reactions--skin rash, itching, hives, swelling of the face, lips, tongue, or throat Capillary leak syndrome--stomach or muscle pain, unusual weakness or fatigue, feeling faint or lightheaded, decrease in the amount of urine, swelling of the ankles, hands, or  feet, trouble breathing Infection--fever, chills, cough, sore throat, wounds that don't heal, pain or trouble when passing urine, general feeling of discomfort or being unwell Liver injury--right upper belly pain, loss of appetite, nausea, light-colored stool, dark yellow or brown urine, yellowing skin or eyes, unusual weakness or fatigue Low red blood cell level--unusual weakness or fatigue, dizziness, headache, trouble breathing Lung injury--shortness of breath or trouble breathing, cough, spitting up blood, chest pain, fever Stomach pain, bloody diarrhea, pale skin, unusual weakness or fatigue, decrease in  the amount of urine, which may be signs of hemolytic uremic syndrome Sudden and severe headache, confusion, change in vision, seizures, which may be signs of posterior reversible encephalopathy syndrome (PRES) Unusual bruising or bleeding Side effects that usually do not require medical attention (report to your care team if they continue or are bothersome): Diarrhea Drowsiness Hair loss Nausea Pain, redness, or swelling with sores inside the mouth or throat Vomiting This list may not describe all possible side effects. Call your doctor for medical advice about side effects. You may report side effects to FDA at 1-800-FDA-1088. Where should I keep my medication? This medication is given in a hospital or clinic. It will not be stored at home. NOTE: This sheet is a summary. It may not cover all possible information. If you have questions about this medicine, talk to your doctor, pharmacist, or health care provider.  2024 [employer]/Gold Standard (2021-07-21 00:00:00)

## 2023-02-01 NOTE — Patient Instructions (Signed)
Recommend starting calcium 1200 mg and vitamin d 800 mcg daily.

## 2023-02-01 NOTE — Progress Notes (Signed)
[facility] CONSULT NOTE  Patient Care Team: Entzminger, Danton Clap, MD as PCP - General (Internal Medicine) Toy Cookey, FNP (Family Medicine) Jim Like, RN as Registered Nurse Scarlett Presto, RN (Inactive) as Registered Nurse Benita Gutter, RN as Oncology Nurse Navigator Almond Lint, MD as Consulting Physician (General Surgery) Earna Coder, MD as Consulting Physician (Internal Medicine)  CHIEF COMPLAINTS/PURPOSE OF CONSULTATION: adenocarcinoma of the pancreas  Oncology History Overview Note  #Pancreas adenocarcinoma-Stage IB- [id number]-[EUS- Dr.Spaete; [facility]/GI; mass 22x3mm; invading/abutting superior mesenteric vein; 2 enlarged lymph nodes peripancreatic/porta hepatis largest 10 x 5.8 mm-nonpathologic based on EUS criteria/no biopsy]-borderline resectable.  PET scan no evidence of distant metastatic disease.  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 Fredirick Maudlin September]  # 01/02/2020-FOLFIRINOX; Neulasta. S/p FOLFIRINOX 10 cycles- April 19th whipples-  s/p neoadjuvant FOLFIRINX. #10 cycles]-s/p Whipple's [on April 19th 2022]- ypT2 [3.5cm]; pN0/11;   DIS-CONTINUED  FOLFIRINOX cycle #11 & 12-  Sec to PN-2-3.  PANCREAS (EXOCRINE), CARCINOMA: Resection  Procedure: Whipple procedure  Tumor Site: Head of pancreas.  Tumor Size: 3.5 cm, slide measurement.  Histologic Type: Pancreatic ductal adenocarcinoma.  Histologic Grade: Moderately differentiated.  Tumor Extension: Into peripancreatic connective tissue.  Treatment Effect: Moderate treatment effect.  Lymphovascular Invasion: Not identified.  Perineural Invasion: Present.  Margins: All surgical margins are negative for carcinoma.  Regional Lymph Nodes:       Number of Lymph Nodes with Tumor: 0       Number of Lymph Nodes Examined: 15  Distant Metastasis:        Distant Site(s) Involved: Not applicable.  Pathologic Stage Classification (pTNM, AJCC 8th Edition): ypT2, ypN0   # Borderline DM; HTN   AUG 18th, 2024- PET scan- Whipple procedure with abnormal hypermetabolism centered at the pancreaticojejunostomy, worrisome for disease recurrence. Associated pancreaticojejunostomy stent in place; Hypermetabolic adjacent retroperitoneal lymph nodes, worrisome for metastatic disease. Ca 19-9 rising.    SEP 3rd, 2024- single gem [3 weeks on 1 week off]-because of neuropathy.    Cancer of head of pancreas (HCC)  12/04/2019 Initial Diagnosis   Cancer of head of pancreas (HCC)   12/19/2019 - 12/19/2019 Chemotherapy   The patient had PACLitaxel-protein bound (ABRAXANE) chemo infusion 250 mg, 125 mg/m2 = 250 mg, Intravenous,  Once, 1 of 4 cycles Administration: 250 mg (12/19/2019) gemcitabine (GEMZAR) 2,000 mg in sodium chloride 0.9 % 250 mL chemo infusion, 1,976 mg, Intravenous,  Once, 1 of 4 cycles Administration: 2,000 mg (12/19/2019)  for chemotherapy treatment.    01/02/2020 - 05/15/2020 Chemotherapy   Patient is on Treatment Plan : PANCREAS Modified FOLFIRINOX q14d x 4 cycles     11/30/2022 -  Chemotherapy   Patient is on Treatment Plan : PANCREAS Gemcitabine D1,8, (1000) q21d      HISTORY OF PRESENTING ILLNESS: Patient ambulating-independently.  Alone.   Robin Daniels 67 y.o. female with recurrent stage IV pancreatic adenocarcinoma, currently receiving gemcitabine chemotherapy who returns to clinic for further evaluation and consideration of continuation of treatment.        Review of Systems  Constitutional:  Negative for chills, diaphoresis, fever and malaise/fatigue.  HENT:  Negative for nosebleeds and sore throat.   Eyes:  Negative for double vision.  Respiratory:  Negative for cough, hemoptysis, sputum production, shortness of breath and wheezing.   Cardiovascular:  Negative for chest pain, palpitations, orthopnea and leg swelling.   Gastrointestinal:  Negative for abdominal pain, blood in stool, constipation, diarrhea, heartburn, melena, nausea and vomiting.  Genitourinary:  Negative for dysuria, frequency and urgency.  Musculoskeletal:  Negative for back pain and joint pain.  Skin: Negative.  Negative for itching and rash.  Neurological:  Positive for tingling. Negative for dizziness, focal weakness, weakness and headaches.  Endo/Heme/Allergies:  Does not bruise/bleed easily.  Psychiatric/Behavioral:  Negative for depression. The patient is not nervous/anxious and does not have insomnia.      MEDICAL HISTORY:  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/04/2019   Complication of anesthesia    Fluttering heart    Hyperlipidemia    Hypertension    PONV (postoperative nausea and vomiting)    Pre-diabetes    Vertigo    none recently    SURGICAL HISTORY: Past Surgical History:  Procedure Laterality Date   CATARACT EXTRACTION W/PHACO Right 06/09/2021   Procedure: CATARACT EXTRACTION PHACO AND INTRAOCULAR LENS PLACEMENT (IOC) RIGHT malyugin;  Surgeon: Galen Manila, MD;  Location[facility];  Service: Ophthalmology;  Laterality: Right;  12.40 1:07.0   COLONOSCOPY WITH PROPOFOL N/A 01/13/2021   Procedure: COLONOSCOPY WITH PROPOFOL;  Surgeon: Wyline Mood, MD;  Location: [facility] END[facility]COPY;  Service: Gastroenterology;  Laterality: N/A;   ENDOSCOPIC RETROGRADE CHOLANGIOPANCREATOGRAPHY (ERCP) WITH PROPOFOL N/A 11/16/2019   Procedure: ENDOSCOPIC RETROGRADE CHOLANGIOPANCREATOGRAPHY (ERCP) WITH PROPOFOL;  Surgeon: Midge Minium, MD;  Location: [facility] ENDOSCOPY;  Service: Endoscopy;  Laterality: N/A;   ERCP N/A 03/27/2020   Procedure: ENDOSCOPIC RETROGRADE CHOLANGIOPANCREATOGRAPHY (ERCP);  Surgeon: Midge Minium, MD;  Location: [facility] ENDOSCOPY;  Service: Endoscopy;  Laterality: N/A;   EUS N/A 11/29/2019   Procedure: FULL UPPER  ENDOSCOPIC ULTRASOUND (EUS) RADIAL;  Surgeon: Doren Custard, MD;  Location: [facility] ENDOSCOPY;  Service: Gastroenterology;  Laterality: N/A;   IR FLUORO RM 30-60 MIN  07/30/2020   IR IMAGING GUIDED PORT INSERTION  12/14/2019   JEJUNOSTOMY Left 07/15/2020   Procedure: Rance Muir;  Surgeon: Almond Lint, MD;  Location: [facility] OR;  Service: General;  Laterality: Left;   LAPAROSCOPY N/A 07/15/2020   Procedure: DIAGNOSTIC LAPAROSCOPY;  Surgeon: Almond Lint, MD;  Location: [facility] OR;  Service: General;  Laterality: N/A;   right knee replacement Right [id number]   SHOULDER ARTHROSCOPY W/ ROTATOR CUFF REPAIR Right    WHIPPLE PROCEDURE N/A 07/15/2020   Procedure: WHIPPLE PROCEDURE;  Surgeon: Almond Lint, MD;  Location[facility];  Service: General;  Laterality: N/A;    SOCIAL HISTORY: Social History   Socioeconomic History   Marital status: Divorced    Spouse name: Not on file   Number of children: Not on file   Years of education: Not on file   Highest education level: Not on file  Occupational History   Not on file  Tobacco Use   Smoking status: Never   Smokeless tobacco: Never  Vaping Use   Vaping status: Never Used  Substance and Sexual Activity   Alcohol use: No    Alcohol/week: 0.0 standard drinks of alcohol   Drug use: No   Sexual activity: Never  Other Topics Concern   Not on file  Social History Narrative   ** Merged History Encounter **       Lives in [place]; with mom and son; worked in dietary; never smoked; no alcohol.    Social Determinants of Health   Financial Resource Strain: Low Risk  (  12/01/2022)   Overall Financial Resource Strain (CARDIA)    Difficulty of Paying Living Expenses: Not hard at all  Food Insecurity: No Food Insecurity (12/01/2022)   Hunger Vital Sign    Worried About Running Out of Food in the Last Year: Never true    Ran Out of Food in the Last Year: Never true  Transportation Needs: No Transportation Needs (12/01/2022)   PRAPARE - Doctor, general practice (Medical): No    Lack of Transportation (Non-Medical): No  Physical Activity: Not on file  Stress: Not on file  Social Connections: Not on file  Intimate Partner Violence: Not At Risk (12/01/2022)   Humiliation, Afraid, Rape, and Kick questionnaire    Fear of Current or Ex-Partner: No    Emotionally Abused: No    Physically Abused: No    Sexually Abused: No    FAMILY HISTORY: Family History  Problem Relation Age of Onset   Diabetes Mother    Hyperlipidemia Mother    Hypertension Mother    Vision loss Mother    Arthritis Father    Hyperlipidemia Father    Hypertension Father    Breast cancer Maternal Aunt     ALLERGIES:  has No Known Allergies.  MEDICATIONS:  Current Outpatient Medications  Medication Sig Dispense Refill   acetaminophen (TYLENOL) 325 MG tablet Take 650 mg by mouth every 6 (six) hours as needed for moderate pain.     amLODipine (NORVASC) 10 MG tablet Take 1 tablet (10 mg total) by mouth daily. 30 tablet 0   Calcium Carbonate-Vit D-Min (CALCIUM 600+D PLUS MINERALS PO) Take 1 tablet by mouth in the morning and at bedtime.     DULoxetine (CYMBALTA) 30 MG capsule Take 1 capsule (30 mg total) by mouth daily. 30 capsule 3   gabapentin (NEURONTIN) 300 MG capsule Take 300 mg by mouth daily.     lidocaine-prilocaine (EMLA) cream Apply on the port. 30 -45 min  prior to port access. 30 g 3   lisinopril (ZESTRIL) 10 MG tablet Take 10 mg by mouth daily.     metoprolol succinate (TOPROL-XL) 50 MG 24 hr tablet Take 1 tablet (50 mg total) by mouth daily. Take with or immediately following a meal. 30 tablet 0   ondansetron (ZOFRAN) 8 MG tablet One pill every 8 hours as needed for nausea/vomitting. (Patient not taking: Reported on 01/11/2023) 40 tablet 1   potassium chloride SA (KLOR-CON M) 20 MEQ tablet Take 1 tablet (20 mEq total) by mouth 2 (two) times daily. 60 tablet 1   prochlorperazine (COMPAZINE) 10 MG tablet Take 1 tablet (10 mg total) by mouth every 6  (six) hours as needed for nausea or vomiting. (Patient not taking: Reported on 01/11/2023) 40 tablet 1   vitamin B-12 (CYANOCOBALAMIN) 1000 MCG tablet Take 1 tablet (1,000 mcg total) by mouth daily.     No current facility-administered medications for this visit.   Facility-Administered Medications Ordered in Other Visits  Medication Dose Route Frequency Provider Last Rate Last Admin   sodium chloride flush (NS) 0.9 % injection 10 mL  10 mL Intravenous PRN Louretta Shorten R, MD   10 mL at 06/23/21 1012      PHYSICAL EXAMINATION: ECOG PERFORMANCE STATUS: 1 - Symptomatic but completely ambulatory  Vitals:   02/01/23 0928  BP: (!) 174/85  Pulse: 99  Temp: (!) 96.4 F (35.8 C)  SpO2: 100%    Filed Weights   02/01/23 0928  Weight: 179 lb (81.2 kg)  Physical Exam Constitutional:      Appearance: She is not ill-appearing.  Eyes:     General: No scleral icterus.    Conjunctiva/sclera: Conjunctivae normal.  Cardiovascular:     Rate and Rhythm: Normal rate and regular rhythm.  Abdominal:     General: There is no distension.     Palpations: Abdomen is soft.     Tenderness: There is no abdominal tenderness. There is no guarding.  Musculoskeletal:        General: No deformity.     Right lower leg: No edema.     Left lower leg: No edema.  Skin:    General: Skin is warm and dry.  Neurological:     Mental Status: She is alert and oriented to person, place, and time.  Psychiatric:        Mood and Affect: Mood normal.        Behavior: Behavior normal.    LABORATORY DATA:  I have reviewed the data as listed Lab Results  Component Value Date   WBC 5.1 02/01/2023   HGB 8.5 (L) 02/01/2023   HCT 26.4 (L) 02/01/2023   MCV 78.6 (L) 02/01/2023   PLT 390 02/01/2023   Recent Labs    01/11/23 1035 01/18/23 1131 02/01/23 0916  NA 135 134* 134*  K 3.3* 3.4* 3.3*  CL 102 101 103  CO2 24 26 22   GLUCOSE 143* 137* 126*  BUN 10 9 10   CREATININE 0.88 0.93 0.91  CALCIUM  8.7* 8.7* 8.3*  GFRNONAA >60 >60 >60  PROT 7.3 6.7 6.9  ALBUMIN 3.6 3.5 3.5  AST 23 44* 27  ALT 17 47* 27  ALKPHOS 81 85 90  BILITOT 0.3 0.3 0.4   Component Ref Range & Units 3 wk ago 1 mo ago 2 mo ago 3 mo ago 6 mo ago 9 mo ago 11 mo ago  CA 19-9 0 - 35 U/mL 87 High  116 High  CM 88 High  CM 65 High  CM 24 CM 21 CM 17    Iron/TIBC/Ferritin/ %Sat    Component Value Date/Time   IRON 33 01/18/2023 1131   TIBC 329 01/18/2023 1131   FERRITIN 96 01/18/2023 1131   IRONPCTSAT 10 (L) 01/18/2023 1131     RADIOGRAPHIC STUDIES: I have personally reviewed the radiological images as listed and agreed with the findings in the report. CT CHEST ABDOMEN PELVIS W CONTRAST  Result Date: 02/01/2023 CLINICAL DATA:  Pancreatic cancer restaging, recurrence status post Whipple procedure * Tracking Code: BO * EXAM: CT CHEST, ABDOMEN, AND PELVIS WITH CONTRAST TECHNIQUE: Multidetector CT imaging of the chest, abdomen and pelvis was performed following the standard protocol during bolus administration of intravenous contrast. RADIATION DOSE REDUCTION: This exam was performed according to the departmental dose-optimization program which includes automated exposure control, adjustment of the mA and/or kV according to patient size and/or use of iterative reconstruction technique. CONTRAST:  OMNIPAQUE IOHEXOL 300 MG/ML  SOLN COMPARISON:  PET-CT, 11/09/2022 FINDINGS: CT CHEST FINDINGS Cardiovascular: Right chest port catheter. Aortic atherosclerosis. Normal heart size. Left coronary artery calcifications. No pericardial effusion. Mediastinum/Nodes: No enlarged mediastinal, hilar, or axillary lymph nodes. Thyroid gland, trachea, and esophagus demonstrate no significant findings. Lungs/Pleura: Lungs are clear. No pleural effusion or pneumothorax. Unchanged subpleural lipoma about the dependent right hemithorax (series 4, image 81) Musculoskeletal: No chest wall abnormality. No acute osseous findings. CT ABDOMEN  PELVIS FINDINGS Hepatobiliary: No focal liver abnormality is seen. Status post cholecystectomy and hepatojejunostomy. Mild biliary  dilatation. Pancreas: Status post Whipple pancreaticoduodenectomy and reanastomosis with main pancreatic duct stent. Unchanged appearance of soft tissue and decompressed bowel loops about the anastomosis, previously with abnormal FDG avidity (series 2, image 59). Significantly increased infiltrative soft tissue about the adjacent central portion of the portal vein, which is narrowed (series 2, image 57) as well as the celiac axis, also with some degree of abnormal FDG avidity on prior examination (series 2, image 55). No pancreatic ductal dilatation or surrounding inflammatory changes. Spleen: Normal in size without significant abnormality. Adrenals/Urinary Tract: Adrenal glands are unremarkable. Kidneys are normal, without renal calculi, solid lesion, or hydronephrosis. Bladder is unremarkable. Stomach/Bowel: Status post distal gastrectomy and gastrojejunostomy. Appendix appears normal. No evidence of bowel wall thickening, distention, or inflammatory changes. Vascular/Lymphatic: Aortic atherosclerosis. No enlarged abdominal or pelvic lymph nodes. Reproductive: No mass or other abnormality. Other: Fat containing midline ventral hernia (series 2, image 70). Small volume free fluid in the low pelvis (series 2, image 106). Minimal peritoneal thickening and nodularity in the left upper quadrant (series 2, image 58) Musculoskeletal: No acute osseous findings. IMPRESSION: 1. Status post Whipple pancreaticoduodenectomy and reanastomosis with main pancreatic duct stent. 2. Unchanged appearance of soft tissue and decompressed bowel loops about the anastomosis, previously with abnormal FDG avidity. 3. Significantly increased infiltrative soft tissue about the adjacent central portion of the portal vein, which is narrowed, as well as the celiac axis, also with some degree of abnormal FDG avidity  on prior examination. 4. Findings are consistent with local recurrence of pancreatic adenocarcinoma. 5. Minimal peritoneal thickening and nodularity in the left upper quadrant, new compared to prior examination, as well as small volume free fluid in the low pelvis, consistent with early peritoneal metastatic disease. 6. Previously described FDG avid lymph nodes are not enlarged by CT. 7. No evidence of distant metastatic disease in the chest. 8. Coronary artery disease. Aortic Atherosclerosis (ICD10-I70.0). Electronically Signed   By: Jearld Lesch M.D.   On: 02/01/2023 09:44    ASSESSMENT & PLAN:   # Cancer of head of pancreas - AUG 2024- STAGE IV/METASTATIC- Recurrent Pancreas adenocarcinoma [2021-Stage IB- s/p neoadjuvant FOLFIRINX. #10 cycles]- s/p Whipple's [on April 19th 2022]- ypT2ypN0.  AUG 18th, 2024- PET scan- Whipple procedure with abnormal hypermetabolism centered at the pancreaticojejunostomy, worrisome for disease recurrence. Associated pancreaticojejunostomy stent in place; Hypermetabolic adjacent retroperitoneal lymph nodes, worrisome for metastatic disease. Ca 19-9 rising.  NGS-tempus- pending.  # Reviewed with the patient regarding concerns for recurrent pancreatic cancer-given the imaging findings and also rising CA 19-9. Reviewed imaging at tumor conference which was discussed with Dr. Rowan Blase.   # Currently s/p cycle 3 of gemcitabine chemotherapy. CA 19-9 has been elevated but roughly stable. 01/21/23 CT C/A/P pending at time of visit but possibly mixed response. Clinically she is stable so we will continue treatment for now. Plan to repeat ca 19-9 at next visit.    # Anemia- microcytic- No history of IDA. colo [EUS- 2021; oct 2022- colo; Dr.Anna]. Likely d/t chemotherapy. Hmg 8.5. Ferritin normal but may be acute phase reactant. Iron sat 10%. Recommend starting oral iron supplement such as Gentle Iron- less likely to have GI side effects. Sample provided.   # Hypocalcemia- K 8.3.  Worse. Start oral calcium supplement and vitamin D.    # Severe hypokalemia -potassium 2.8. On Kdur 20 meq twice daily. Today, 3.3. Improved. 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 Started on cymbalta which is managed by palliative care.  Improved.    # Hypertension-poorly controlled 180s]; Continue Norvasc/Metoprlol. Today, 150/78. Per patient, similar readings at home but later endorses sporadic checking. Has not created log. Encouraged her to log pressures and/or bring machine.    # Mediport:  continue port flushes q 2-24M. stable.    # Anxiety/depression/insomnia: s/p Josh Borders- re: cymbalta   DISPOSITION:  Chemo today 1 week- port/lab, gemzar 11/26- port/lab, Dr Donneta Romberg, gem chemo per [facility]- la  No problem-specific Assessment & Plan notes found for this encounter.  Alinda Dooms, NP 02/01/2023

## 2023-02-04 ENCOUNTER — Encounter: Payer: Self-pay | Admitting: Internal Medicine

## 2023-02-08 ENCOUNTER — Inpatient Hospital Stay: Payer: 59

## 2023-02-08 ENCOUNTER — Encounter: Payer: Self-pay | Admitting: Internal Medicine

## 2023-02-08 VITALS — BP 160/80 | HR 95 | Temp 97.1°F | Resp 18

## 2023-02-08 DIAGNOSIS — Z5111 Encounter for antineoplastic chemotherapy: Secondary | ICD-10-CM | POA: Diagnosis not present

## 2023-02-08 DIAGNOSIS — C250 Malignant neoplasm of head of pancreas: Secondary | ICD-10-CM

## 2023-02-08 LAB — CBC WITH DIFFERENTIAL (CANCER CENTER ONLY)
Abs Immature Granulocytes: 0.03 10*3/uL (ref 0.00–0.07)
Basophils Absolute: 0 10*3/uL (ref 0.0–0.1)
Basophils Relative: 0 %
Eosinophils Absolute: 0 10*3/uL (ref 0.0–0.5)
Eosinophils Relative: 0 %
HCT: 26 % — ABNORMAL LOW (ref 36.0–46.0)
Hemoglobin: 8.4 g/dL — ABNORMAL LOW (ref 12.0–15.0)
Immature Granulocytes: 1 %
Lymphocytes Relative: 44 %
Lymphs Abs: 1.5 10*3/uL (ref 0.7–4.0)
MCH: 25.6 pg — ABNORMAL LOW (ref 26.0–34.0)
MCHC: 32.3 g/dL (ref 30.0–36.0)
MCV: 79.3 fL — ABNORMAL LOW (ref 80.0–100.0)
Monocytes Absolute: 0.3 10*3/uL (ref 0.1–1.0)
Monocytes Relative: 9 %
Neutro Abs: 1.5 10*3/uL — ABNORMAL LOW (ref 1.7–7.7)
Neutrophils Relative %: 46 %
Platelet Count: 369 10*3/uL (ref 150–400)
RBC: 3.28 MIL/uL — ABNORMAL LOW (ref 3.87–5.11)
RDW: 22 % — ABNORMAL HIGH (ref 11.5–15.5)
WBC Count: 3.4 10*3/uL — ABNORMAL LOW (ref 4.0–10.5)
nRBC: 0 % (ref 0.0–0.2)

## 2023-02-08 LAB — CMP (CANCER CENTER ONLY)
ALT: 48 U/L — ABNORMAL HIGH (ref 0–44)
AST: 53 U/L — ABNORMAL HIGH (ref 15–41)
Albumin: 3.6 g/dL (ref 3.5–5.0)
Alkaline Phosphatase: 94 U/L (ref 38–126)
Anion gap: 10 (ref 5–15)
BUN: 9 mg/dL (ref 8–23)
CO2: 24 mmol/L (ref 22–32)
Calcium: 8.9 mg/dL (ref 8.9–10.3)
Chloride: 102 mmol/L (ref 98–111)
Creatinine: 0.79 mg/dL (ref 0.44–1.00)
GFR, Estimated: >60 mL/min (ref >60)
Glucose, Bld: 166 mg/dL — ABNORMAL HIGH (ref 70–99)
Potassium: 3.3 mmol/L — ABNORMAL LOW (ref 3.5–5.1)
Sodium: 136 mmol/L (ref 135–145)
Total Bilirubin: 0.3 mg/dL (ref <1.2)
Total Protein: 7.3 g/dL (ref 6.5–8.1)

## 2023-02-08 MED ORDER — SODIUM CHLORIDE 0.9 % IV SOLN
Freq: Once | INTRAVENOUS | Status: AC
  Filled 2023-02-08: qty 250

## 2023-02-08 MED ORDER — HEPARIN SOD (PORK) LOCK FLUSH 100 UNIT/ML IV SOLN
500.0000 [IU] | Freq: Once | INTRAVENOUS | Status: AC | PRN
  Administered 2023-02-08: 500 [IU]
  Filled 2023-02-08: qty 5

## 2023-02-08 MED ORDER — PROCHLORPERAZINE MALEATE 10 MG PO TABS
10.0000 mg | ORAL_TABLET | Freq: Once | ORAL | Status: AC
Start: 2023-02-08 — End: 2023-02-08
  Administered 2023-02-08: 10 mg via ORAL
  Filled 2023-02-08: qty 1

## 2023-02-08 MED ORDER — SODIUM CHLORIDE 0.9 % IV SOLN
1000.0000 mg/m2 | Freq: Once | INTRAVENOUS | Status: AC
Start: 2023-02-08 — End: 2023-02-08
  Administered 2023-02-08: 1900 mg via INTRAVENOUS
  Filled 2023-02-08: qty 49.97

## 2023-02-08 NOTE — Patient Instructions (Signed)
 [facility] - A DEPT OF [facility]  Discharge Instructions: Thank you for choosing [facility] to provide your oncology and hematology care.  If you have a lab appointment with the Cancer Center, please go directly to the Cancer Center and check in at the registration area.  Wear comfortable clothing and clothing appropriate for easy access to any Portacath or PICC line.   We strive to give you quality time with your provider. You may need to reschedule your appointment if you arrive late (15 or more minutes).  Arriving late affects you and other patients whose appointments are after yours.  Also, if you miss three or more appointments without notifying the office, you may be dismissed from the clinic at the provider's discretion.      For prescription refill requests, have your pharmacy contact our office and allow 72 hours for refills to be completed.    Today you received the following chemotherapy and/or immunotherapy agents GEMZAR      To help prevent nausea and vomiting after your treatment, we encourage you to take your nausea medication as directed.  BELOW ARE SYMPTOMS THAT SHOULD BE REPORTED IMMEDIATELY: *FEVER GREATER THAN 100.4 F (38 C) OR HIGHER *CHILLS OR SWEATING *NAUSEA AND VOMITING THAT IS NOT CONTROLLED WITH YOUR NAUSEA MEDICATION *UNUSUAL SHORTNESS OF BREATH *UNUSUAL BRUISING OR BLEEDING *URINARY PROBLEMS (pain or burning when urinating, or frequent urination) *BOWEL PROBLEMS (unusual diarrhea, constipation, pain near the anus) TENDERNESS IN MOUTH AND THROAT WITH OR WITHOUT PRESENCE OF ULCERS (sore throat, sores in mouth, or a toothache) UNUSUAL RASH, SWELLING OR PAIN  UNUSUAL VAGINAL DISCHARGE OR ITCHING   Items with * indicate a potential emergency and should be followed up as soon as possible or go to the Emergency Department if any problems should occur.  Please show the CHEMOTHERAPY ALERT CARD or IMMUNOTHERAPY ALERT  CARD at check-in to the Emergency Department and triage nurse.  Should you have questions after your visit or need to cancel or reschedule your appointment, please contact [facility] - A DEPT OF [facility]  [phone] and follow the prompts.  Office hours are 8:00 a.m. to 4:30 p.m. Monday - Friday. Please note that voicemails left after 4:00 p.m. may not be returned until the following business day.  We are closed weekends and major holidays. You have access to a nurse at all times for urgent questions. Please call the main number to the clinic [phone] and follow the prompts.  For any non-urgent questions, you may also contact your provider using MyChart. We now offer e-Visits for anyone 79 and older to request care online for non-urgent symptoms. For details visit [email or web address].   Also download the MyChart app! Go to the app store, search "MyChart", open the app, select , and log in with your MyChart username and password.  Gemcitabine Injection What is this medication? GEMCITABINE (jem SYE ta been) treats some types of cancer. It works by slowing down the growth of cancer cells. This medicine may be used for other purposes; ask your health care provider or pharmacist if you have questions. COMMON BRAND NAME(S): Gemzar, Infugem What should I tell my care team before I take this medication? They need to know if you have any of these conditions: Blood disorders Infection Kidney disease Liver disease Lung or breathing disease, such as asthma or COPD Recent or ongoing radiation therapy An unusual or allergic reaction to gemcitabine, other  medications, foods, dyes, or preservatives If you or your partner are pregnant or trying to get pregnant Breast-feeding How should I use this medication? This medication is injected into a vein. It is given by your care team in a hospital or clinic setting. Talk to your care team about the use of  this medication in children. Special care may be needed. Overdosage: If you think you have taken too much of this medicine contact a poison control center or emergency room at once. NOTE: This medicine is only for you. Do not share this medicine with others. What if I miss a dose? Keep appointments for follow-up doses. It is important not to miss your dose. Call your care team if you are unable to keep an appointment. What may interact with this medication? Interactions have not been studied. This list may not describe all possible interactions. Give your health care provider a list of all the medicines, herbs, non-prescription drugs, or dietary supplements you use. Also tell them if you smoke, drink alcohol, or use illegal drugs. Some items may interact with your medicine. What should I watch for while using this medication? Your condition will be monitored carefully while you are receiving this medication. This medication may make you feel generally unwell. This is not uncommon, as chemotherapy can affect healthy cells as well as cancer cells. Report any side effects. Continue your course of treatment even though you feel ill unless your care team tells you to stop. In some cases, you may be given additional medications to help with side effects. Follow all directions for their use. This medication may increase your risk of getting an infection. Call your care team for advice if you get a fever, chills, sore throat, or other symptoms of a cold or flu. Do not treat yourself. Try to avoid being around people who are sick. This medication may increase your risk to bruise or bleed. Call your care team if you notice any unusual bleeding. Be careful brushing or flossing your teeth or using a toothpick because you may get an infection or bleed more easily. If you have any dental work done, tell your dentist you are receiving this medication. Avoid taking medications that contain aspirin, acetaminophen,  ibuprofen, naproxen, or ketoprofen unless instructed by your care team. These medications may hide a fever. Talk to your care team if you or your partner wish to become pregnant or think you might be pregnant. This medication can cause serious birth defects if taken during pregnancy and for 6 months after the last dose. A negative pregnancy test is required before starting this medication. A reliable form of contraception is recommended while taking this medication and for 6 months after the last dose. Talk to your care team about effective forms of contraception. Do not father a child while taking this medication and for 3 months after the last dose. Use a condom while having sex during this time period. Do not breastfeed while taking this medication and for at least 1 week after the last dose. This medication may cause infertility. Talk to your care team if you are concerned about your fertility. What side effects may I notice from receiving this medication? Side effects that you should report to your care team as soon as possible: Allergic reactions--skin rash, itching, hives, swelling of the face, lips, tongue, or throat Capillary leak syndrome--stomach or muscle pain, unusual weakness or fatigue, feeling faint or lightheaded, decrease in the amount of urine, swelling of the ankles, hands, or  feet, trouble breathing Infection--fever, chills, cough, sore throat, wounds that don't heal, pain or trouble when passing urine, general feeling of discomfort or being unwell Liver injury--right upper belly pain, loss of appetite, nausea, light-colored stool, dark yellow or brown urine, yellowing skin or eyes, unusual weakness or fatigue Low red blood cell level--unusual weakness or fatigue, dizziness, headache, trouble breathing Lung injury--shortness of breath or trouble breathing, cough, spitting up blood, chest pain, fever Stomach pain, bloody diarrhea, pale skin, unusual weakness or fatigue, decrease in  the amount of urine, which may be signs of hemolytic uremic syndrome Sudden and severe headache, confusion, change in vision, seizures, which may be signs of posterior reversible encephalopathy syndrome (PRES) Unusual bruising or bleeding Side effects that usually do not require medical attention (report to your care team if they continue or are bothersome): Diarrhea Drowsiness Hair loss Nausea Pain, redness, or swelling with sores inside the mouth or throat Vomiting This list may not describe all possible side effects. Call your doctor for medical advice about side effects. You may report side effects to FDA at 1-800-FDA-1088. Where should I keep my medication? This medication is given in a hospital or clinic. It will not be stored at home. NOTE: This sheet is a summary. It may not cover all possible information. If you have questions about this medicine, talk to your doctor, pharmacist, or health care provider.  2024 [employer]vier/Gold Standard (2021-07-21 00:00:00)

## 2023-02-22 ENCOUNTER — Other Ambulatory Visit: Payer: 59

## 2023-02-22 ENCOUNTER — Inpatient Hospital Stay (HOSPITAL_BASED_OUTPATIENT_CLINIC_OR_DEPARTMENT_OTHER): Payer: 59 | Admitting: Internal Medicine

## 2023-02-22 ENCOUNTER — Inpatient Hospital Stay: Payer: 59

## 2023-02-22 ENCOUNTER — Encounter: Payer: Self-pay | Admitting: Internal Medicine

## 2023-02-22 ENCOUNTER — Ambulatory Visit: Payer: 59 | Admitting: Internal Medicine

## 2023-02-22 ENCOUNTER — Ambulatory Visit: Payer: 59

## 2023-02-22 VITALS — BP 141/84 | HR 85 | Temp 96.4°F | Ht 64.0 in | Wt 177.2 lb

## 2023-02-22 DIAGNOSIS — C250 Malignant neoplasm of head of pancreas: Secondary | ICD-10-CM | POA: Diagnosis not present

## 2023-02-22 DIAGNOSIS — Z5111 Encounter for antineoplastic chemotherapy: Secondary | ICD-10-CM | POA: Diagnosis not present

## 2023-02-22 LAB — CMP (CANCER CENTER ONLY)
ALT: 15 U/L (ref 0–44)
AST: 19 U/L (ref 15–41)
Albumin: 3.6 g/dL (ref 3.5–5.0)
Alkaline Phosphatase: 79 U/L (ref 38–126)
Anion gap: 11 (ref 5–15)
BUN: 9 mg/dL (ref 8–23)
CO2: 23 mmol/L (ref 22–32)
Calcium: 8.6 mg/dL — ABNORMAL LOW (ref 8.9–10.3)
Chloride: 104 mmol/L (ref 98–111)
Creatinine: 0.83 mg/dL (ref 0.44–1.00)
GFR, Estimated: >60 mL/min (ref >60)
Glucose, Bld: 108 mg/dL — ABNORMAL HIGH (ref 70–99)
Potassium: 4.1 mmol/L (ref 3.5–5.1)
Sodium: 138 mmol/L (ref 135–145)
Total Bilirubin: 0.5 mg/dL (ref <1.2)
Total Protein: 7 g/dL (ref 6.5–8.1)

## 2023-02-22 LAB — CBC WITH DIFFERENTIAL (CANCER CENTER ONLY)
Abs Immature Granulocytes: 0.02 10*3/uL (ref 0.00–0.07)
Basophils Absolute: 0 10*3/uL (ref 0.0–0.1)
Basophils Relative: 1 %
Eosinophils Absolute: 0.1 10*3/uL (ref 0.0–0.5)
Eosinophils Relative: 1 %
HCT: 26.4 % — ABNORMAL LOW (ref 36.0–46.0)
Hemoglobin: 8.4 g/dL — ABNORMAL LOW (ref 12.0–15.0)
Immature Granulocytes: 0 %
Lymphocytes Relative: 29 %
Lymphs Abs: 1.5 10*3/uL (ref 0.7–4.0)
MCH: 26.3 pg (ref 26.0–34.0)
MCHC: 31.8 g/dL (ref 30.0–36.0)
MCV: 82.8 fL (ref 80.0–100.0)
Monocytes Absolute: 0.5 10*3/uL (ref 0.1–1.0)
Monocytes Relative: 10 %
Neutro Abs: 3 10*3/uL (ref 1.7–7.7)
Neutrophils Relative %: 59 %
Platelet Count: 373 10*3/uL (ref 150–400)
RBC: 3.19 MIL/uL — ABNORMAL LOW (ref 3.87–5.11)
RDW: 23.1 % — ABNORMAL HIGH (ref 11.5–15.5)
WBC Count: 5.2 10*3/uL (ref 4.0–10.5)
nRBC: 0 % (ref 0.0–0.2)

## 2023-02-22 MED ORDER — HEPARIN SOD (PORK) LOCK FLUSH 100 UNIT/ML IV SOLN
500.0000 [IU] | Freq: Once | INTRAVENOUS | Status: AC
  Administered 2023-02-22: 500 [IU] via INTRAVENOUS
  Filled 2023-02-22: qty 5

## 2023-02-22 MED ORDER — DIPHENOXYLATE-ATROPINE 2.5-0.025 MG PO TABS: 1.0000 | ORAL_TABLET | Freq: Four times a day (QID) | ORAL | 0 refills | Status: DC | PRN

## 2023-02-22 NOTE — Progress Notes (Signed)
DISCONTINUE ON PATHWAY REGIMEN - Pancreatic Adenocarcinoma     A cycle is every 28 days:     Nab-paclitaxel (protein bound)      Gemcitabine   **Always confirm dose/schedule in your pharmacy ordering system**  REASON: Disease Progression PRIOR TREATMENT: PANOS51: Nab-Paclitaxel (Abraxane) 125 mg/m2 D1, 8, 15 + Gemcitabine 1,000 mg/m2 D1, 8, 15 q28 Days Until Progression or Toxicity TREATMENT RESPONSE: Progressive Disease (PD)  START ON PATHWAY REGIMEN - Pancreatic Adenocarcinoma     A cycle is every 14 days:     Liposomal irinotecan      Leucovorin      Fluorouracil   **Always confirm dose/schedule in your pharmacy ordering system**  Patient Characteristics: Metastatic Disease, Second Line, MSS/pMMR or MSI Unknown, Gemcitabine-Based Therapy First Line Therapeutic Status: Metastatic Disease Line of Therapy: Second Line Microsatellite/Mismatch Repair Status: MSS/pMMR Intent of Therapy: Non-Curative / Palliative Intent, Discussed with Patient

## 2023-02-22 NOTE — Addendum Note (Signed)
Addended by: Earna Coder on: 02/22/2023 03:31 PM   Modules accepted: Orders

## 2023-02-22 NOTE — Addendum Note (Signed)
Addended by: Earna Coder on: 02/22/2023 03:56 PM   Modules accepted: Orders

## 2023-02-22 NOTE — Progress Notes (Signed)
[facility] CONSULT NOTE  Patient Care Team: Entzminger, Danton Clap, MD as PCP - General (Internal Medicine) Toy Cookey, FNP (Family Medicine) Jim Like, RN as Registered Nurse Scarlett Presto, RN (Inactive) as Registered Nurse Benita Gutter, RN as Oncology Nurse Navigator Almond Lint, MD as Consulting Physician (General Surgery) Earna Coder, MD as Consulting Physician (Internal Medicine)  CHIEF COMPLAINTS/PURPOSE OF CONSULTATION: pancreas adenocarcinoma  Oncology History Overview Note  #Pancreas adenocarcinoma-Stage IB- [id number]-[EUS- Dr.Spaete; [facility]/GI; mass 22x61mm; invading/abutting superior mesenteric vein; 2 enlarged lymph nodes peripancreatic/porta hepatis largest 10 x 5.8 mm-nonpathologic based on EUS criteria/no biopsy]-borderline resectable.  PET scan no evidence of distant metastatic disease.  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 Fredirick Maudlin September]  # 01/02/2020-FOLFIRINOX; Neulasta. S/p FOLFIRINOX 10 cycles- April 19th whipples-  s/p neoadjuvant FOLFIRINX. #10 cycles]-s/p Whipple's [on April 19th 2022]- ypT2 [3.5cm]; pN0/11;   DIS-CONTINUED  FOLFIRINOX cycle #11 & 12-  Sec to PN-2-3.  PANCREAS (EXOCRINE), CARCINOMA: Resection  Procedure: Whipple procedure  Tumor Site: Head of pancreas.  Tumor Size: 3.5 cm, slide measurement.  Histologic Type: Pancreatic ductal adenocarcinoma.  Histologic Grade: Moderately differentiated.  Tumor Extension: Into peripancreatic connective tissue.  Treatment Effect: Moderate treatment effect.  Lymphovascular Invasion: Not identified.  Perineural Invasion: Present.  Margins: All surgical margins are negative for carcinoma.  Regional Lymph Nodes:       Number of Lymph Nodes with Tumor: 0       Number of Lymph Nodes Examined: 15  Distant Metastasis:       Distant  Site(s) Involved: Not applicable.  Pathologic Stage Classification (pTNM, AJCC 8th Edition): ypT2, ypN0   # Borderline DM; HTN   AUG 18th, 2024- PET scan- Whipple procedure with abnormal hypermetabolism centered at the pancreaticojejunostomy, worrisome for disease recurrence. Associated pancreaticojejunostomy stent in place; Hypermetabolic adjacent retroperitoneal lymph nodes, worrisome for metastatic disease. Ca 19-9 rising.    SEP 3rd, 2024- single gem [3 weeks on 1 week off]-because of neuropathy.  # OCT 29th, 2024-CT scan abdomen pelvis-progression.  # DEC 2024- start NALIRI.     Cancer of head of pancreas (HCC)  12/04/2019 Initial Diagnosis   Cancer of head of pancreas (HCC)   12/19/2019 - 12/19/2019 Chemotherapy   The patient had PACLitaxel-protein bound (ABRAXANE) chemo infusion 250 mg, 125 mg/m2 = 250 mg, Intravenous,  Once, 1 of 4 cycles Administration: 250 mg (12/19/2019) gemcitabine (GEMZAR) 2,000 mg in sodium chloride 0.9 % 250 mL chemo infusion, 1,976 mg, Intravenous,  Once, 1 of 4 cycles Administration: 2,000 mg (12/19/2019)  for chemotherapy treatment.    01/02/2020 - 05/15/2020 Chemotherapy   Patient is on Treatment Plan : PANCREAS Modified FOLFIRINOX q14d x 4 cycles     11/30/2022 - 02/08/2023 Chemotherapy   Patient is on Treatment Plan : PANCREAS Gemcitabine D1,8, (1000) q21d     02/23/2023 -  Chemotherapy   Patient is on Treatment Plan : PANCREAS Liposomal Irinotecan + Leucovorin + 5-FU IVCI q14d      HISTORY OF PRESENTING ILLNESS: Patient ambulating-independently.  Alone.   Robin Daniels 67 y.o.  female history recurrent stage IV  pancreatic adeno ca currently on single agent gemcitabine chemo therapy-history reviewed the results of the recent CAT scan.  Patient otherwise tolerating chemotherapy well.  Denies pain or nausea. Patient has been taking potassium pills since last visit.  Patient continues to have tingling and numbness  in the extremities- currently on  gabapentin not significantly better or worse.  Did not start cymblata as recommended.  Denies any worsening abdominal pain.  No fever no chills. Weight is stable.   Review of Systems  Constitutional:  Negative for chills, diaphoresis, fever and malaise/fatigue.  HENT:  Negative for nosebleeds and sore throat.   Eyes:  Negative for double vision.  Respiratory:  Negative for cough, hemoptysis, sputum production, shortness of breath and wheezing.   Cardiovascular:  Negative for chest pain, palpitations, orthopnea and leg swelling.  Gastrointestinal:  Negative for abdominal pain, blood in stool, constipation, diarrhea, heartburn, melena, nausea and vomiting.  Genitourinary:  Negative for dysuria, frequency and urgency.  Musculoskeletal:  Negative for back pain and joint pain.  Skin: Negative.  Negative for itching and rash.  Neurological:  Positive for tingling. Negative for dizziness, focal weakness, weakness and headaches.  Endo/Heme/Allergies:  Does not bruise/bleed easily.  Psychiatric/Behavioral:  Negative for depression. The patient is not nervous/anxious and does not have insomnia.      MEDICAL HISTORY:  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/04/2019   Complication of anesthesia    Fluttering heart    Hyperlipidemia    Hypertension    PONV (postoperative nausea and vomiting)    Pre-diabetes    Vertigo    none recently    SURGICAL HISTORY: Past Surgical History:  Procedure Laterality Date   CATARACT EXTRACTION W/PHACO Right 06/09/2021   Procedure: CATARACT EXTRACTION PHACO AND INTRAOCULAR LENS PLACEMENT (IOC) RIGHT malyugin;  Surgeon: Galen Manila, MD;  Location: [facility];  Service: Ophthalmology;  Laterality: Right;  12.40 1:07.0   COLONOSCOPY WITH PROPOFOL N/A 01/13/2021   Procedure: COLONOSCOPY WITH PROPOFOL;  Surgeon: Wyline Mood, MD;  Location: [facility];   Service: Gastroenterology;  Laterality: N/A;   ENDOSCOPIC RETROGRADE CHOLANGIOPANCREATOGRAPHY (ERCP) WITH PROPOFOL N/A 11/16/2019   Procedure: ENDOSCOPIC RETROGRADE CHOLANGIOPANCREATOGRAPHY (ERCP) WITH PROPOFOL;  Surgeon: Midge Minium, MD;  Location: [facility] ENDOSCOPY;  Service: Endoscopy;  Laterality: N/A;   ERCP N/A 03/27/2020   Procedure: ENDOSCOPIC RETROGRADE CHOLANGIOPANCREATOGRAPHY (ERCP);  Surgeon: Midge Minium, MD;  Location: [facility] ENDOSCOPY;  Service: Endoscopy;  Laterality: N/A;   EUS N/A 11/29/2019   Procedure: FULL UPPER ENDOSCOPIC ULTRASOUND (EUS) RADIAL;  Surgeon: Doren Custard, MD;  Location: [facility] ENDOSCOPY;  Service: Gastroenterology;  Laterality: N/A;   IR FLUORO RM 30-60 MIN  07/30/2020   IR IMAGING GUIDED PORT INSERTION  12/14/2019   JEJUNOSTOMY Left 07/15/2020   Procedure: Rance Muir;  Surgeon: Almond Lint, MD;  Location: [facility] OR;  Service: General;  Laterality: Left;   LAPAROSCOPY N/A 07/15/2020   Procedure: DIAGNOSTIC LAPAROSCOPY;  Surgeon: Almond Lint, MD;  Location: [facility] OR;  Service: General;  Laterality: N/A;   right knee replacement Right [id number]   SHOULDER ARTHROSCOPY W/ ROTATOR CUFF REPAIR Right    WHIPPLE PROCEDURE N/A 07/15/2020   Procedure: WHIPPLE PROCEDURE;  Surgeon: Almond Lint, MD;  Location: [facility] OR;  Service: General;  Laterality: N/A;    SOCIAL HISTORY: Social History   Socioeconomic History   Marital status: Divorced    Spouse name: Not on file   Number of children: Not on file   Years of education: Not on file   Highest education level: Not on file  Occupational History   Not on file  Tobacco Use   Smoking status: Never   Smokeless  tobacco: Never  Vaping Use   Vaping status: Never Used  Substance and Sexual Activity   Alcohol use: No    Alcohol/week: 0.0 standard drinks of alcohol   Drug use: No   Sexual activity: Never  Other Topics Concern   Not on file  Social History Narrative   ** Merged History Encounter **       Lives in [place];  with mom and son; worked in dietary; never smoked; no alcohol.    Social Determinants of Health   Financial Resource Strain: Low Risk  (12/01/2022)   Overall Financial Resource Strain (CARDIA)    Difficulty of Paying Living Expenses: Not hard at all  Food Insecurity: No Food Insecurity (12/01/2022)   Hunger Vital Sign    Worried About Running Out of Food in the Last Year: Never true    Ran Out of Food in the Last Year: Never true  Transportation Needs: No Transportation Needs (12/01/2022)   PRAPARE - Administrator, Civil Service (Medical): No    Lack of Transportation (Non-Medical): No  Physical Activity: Not on file  Stress: Not on file  Social Connections: Not on file  Intimate Partner Violence: Not At Risk (12/01/2022)   Humiliation, Afraid, Rape, and Kick questionnaire    Fear of Current or Ex-Partner: No    Emotionally Abused: No    Physically Abused: No    Sexually Abused: No    FAMILY HISTORY: Family History  Problem Relation Age of Onset   Diabetes Mother    Hyperlipidemia Mother    Hypertension Mother    Vision loss Mother    Arthritis Father    Hyperlipidemia Father    Hypertension Father    Breast cancer Maternal Aunt     ALLERGIES:  has No Known Allergies.  MEDICATIONS:  Current Outpatient Medications  Medication Sig Dispense Refill   acetaminophen (TYLENOL) 325 MG tablet Take 650 mg by mouth every 6 (six) hours as needed for moderate pain.     amLODipine (NORVASC) 10 MG tablet Take 1 tablet (10 mg total) by mouth daily. 30 tablet 0   Calcium Carbonate-Vit D-Min (CALCIUM 600+D PLUS MINERALS PO) Take 1 tablet by mouth in the morning and at bedtime.     diphenoxylate-atropine (LOMOTIL) 2.5-0.025 MG tablet Take 1 tablet by mouth 4 (four) times daily as needed for diarrhea or loose stools. Take it along with immodium 60 tablet 0   DULoxetine (CYMBALTA) 30 MG capsule Take 1 capsule (30 mg total) by mouth daily. 30 capsule 3   Ferrous Bisglycinate Chelate 28  MG CAPS Take 1 tablet by mouth daily.     gabapentin (NEURONTIN) 300 MG capsule Take 300 mg by mouth daily.     lidocaine-prilocaine (EMLA) cream Apply on the port. 30 -45 min  prior to port access. 30 g 3   lisinopril (ZESTRIL) 10 MG tablet Take 10 mg by mouth daily.     metoprolol succinate (TOPROL-XL) 50 MG 24 hr tablet Take 1 tablet (50 mg total) by mouth daily. Take with or immediately following a meal. 30 tablet 0   ondansetron (ZOFRAN) 8 MG tablet One pill every 8 hours as needed for nausea/vomitting. 40 tablet 1   potassium chloride SA (KLOR-CON M) 20 MEQ tablet Take 1 tablet (20 mEq total) by mouth 2 (two) times daily. 60 tablet 1   prochlorperazine (COMPAZINE) 10 MG tablet Take 1 tablet (10 mg total) by mouth every 6 (six) hours as needed for nausea or vomiting.  40 tablet 1   vitamin B-12 (CYANOCOBALAMIN) 1000 MCG tablet Take 1 tablet (1,000 mcg total) by mouth daily.     No current facility-administered medications for this visit.   Facility-Administered Medications Ordered in Other Visits  Medication Dose Route Frequency Provider Last Rate Last Admin   sodium chloride flush (NS) 0.9 % injection 10 mL  10 mL Intravenous PRN Earna Coder, MD   10 mL at 06/23/21 1012      PHYSICAL EXAMINATION: ECOG PERFORMANCE STATUS: 0 - Asymptomatic  Vitals:   02/22/23 1251 02/22/23 1308  BP: (!) 159/85 (!) 141/84  Pulse: 85   Temp: (!) 96.4 F (35.8 C)   SpO2: 100%        Filed Weights   02/22/23 1251  Weight: 177 lb 3.2 oz (80.4 kg)      Physical Exam Vitals reviewed.  Constitutional:      Appearance: She is not ill-appearing.  HENT:     Head: Normocephalic and atraumatic.     Mouth/Throat:     Pharynx: No oropharyngeal exudate.  Eyes:     General: No scleral icterus. Cardiovascular:     Rate and Rhythm: Normal rate and regular rhythm.  Pulmonary:     Effort: Pulmonary effort is normal. No respiratory distress.     Breath sounds: No wheezing.  Abdominal:      General: There is no distension.     Palpations: Abdomen is soft.     Tenderness: There is no abdominal tenderness. There is no guarding.  Musculoskeletal:        General: No tenderness or deformity.  Lymphadenopathy:     Cervical: No cervical adenopathy.  Skin:    General: Skin is warm.     Coloration: Skin is not pale.  Neurological:     Mental Status: She is alert and oriented to person, place, and time.  Psychiatric:        Mood and Affect: Mood and affect normal.        Behavior: Behavior normal.    LABORATORY DATA:  I have reviewed the data as listed Lab Results  Component Value Date   WBC 5.2 02/22/2023   HGB 8.4 (L) 02/22/2023   HCT 26.4 (L) 02/22/2023   MCV 82.8 02/22/2023   PLT 373 02/22/2023   Recent Labs    02/01/23 0916 02/08/23 0942 02/22/23 1234  NA 134* 136 138  K 3.3* 3.3* 4.1  CL 103 102 104  CO2 22 24 23   GLUCOSE 126* 166* 108*  BUN 10 9 9   CREATININE 0.91 0.79 0.83  CALCIUM 8.3* 8.9 8.6*  GFRNONAA >60 >60 >60  PROT 6.9 7.3 7.0  ALBUMIN 3.5 3.6 3.6  AST 27 53* 19  ALT 27 48* 15  ALKPHOS 90 94 79  BILITOT 0.4 0.3 0.5   Component Ref Range & Units 1 mo ago (06/25/22) 4 mo ago (03/26/22) 6 mo ago (01/22/22) 8 mo ago (11/23/21) 1 yr ago (06/23/21) 1 yr ago (04/20/21) 1 yr ago (01/23/21)  CA 19-9 0 - 35 U/mL 24 21 CM 17 CM 12 CM 14 CM 13 CM 16 CM     RADIOGRAPHIC STUDIES: I have personally reviewed the radiological images as listed and agreed with the findings in the report. CT CHEST ABDOMEN PELVIS W CONTRAST  Result Date: 02/01/2023 CLINICAL DATA:  Pancreatic cancer restaging, recurrence status post Whipple procedure * Tracking Code: BO * EXAM: CT CHEST, ABDOMEN, AND PELVIS WITH CONTRAST TECHNIQUE: Multidetector CT imaging of the  chest, abdomen and pelvis was performed following the standard protocol during bolus administration of intravenous contrast. RADIATION DOSE REDUCTION: This exam was performed according to the departmental  dose-optimization program which includes automated exposure control, adjustment of the mA and/or kV according to patient size and/or use of iterative reconstruction technique. CONTRAST:  OMNIPAQUE IOHEXOL 300 MG/ML  SOLN COMPARISON:  PET-CT, 11/09/2022 FINDINGS: CT CHEST FINDINGS Cardiovascular: Right chest port catheter. Aortic atherosclerosis. Normal heart size. Left coronary artery calcifications. No pericardial effusion. Mediastinum/Nodes: No enlarged mediastinal, hilar, or axillary lymph nodes. Thyroid gland, trachea, and esophagus demonstrate no significant findings. Lungs/Pleura: Lungs are clear. No pleural effusion or pneumothorax. Unchanged subpleural lipoma about the dependent right hemithorax (series 4, image 81) Musculoskeletal: No chest wall abnormality. No acute osseous findings. CT ABDOMEN PELVIS FINDINGS Hepatobiliary: No focal liver abnormality is seen. Status post cholecystectomy and hepatojejunostomy. Mild biliary dilatation. Pancreas: Status post Whipple pancreaticoduodenectomy and reanastomosis with main pancreatic duct stent. Unchanged appearance of soft tissue and decompressed bowel loops about the anastomosis, previously with abnormal FDG avidity (series 2, image 59). Significantly increased infiltrative soft tissue about the adjacent central portion of the portal vein, which is narrowed (series 2, image 57) as well as the celiac axis, also with some degree of abnormal FDG avidity on prior examination (series 2, image 55). No pancreatic ductal dilatation or surrounding inflammatory changes. Spleen: Normal in size without significant abnormality. Adrenals/Urinary Tract: Adrenal glands are unremarkable. Kidneys are normal, without renal calculi, solid lesion, or hydronephrosis. Bladder is unremarkable. Stomach/Bowel: Status post distal gastrectomy and gastrojejunostomy. Appendix appears normal. No evidence of bowel wall thickening, distention, or inflammatory changes. Vascular/Lymphatic:  Aortic atherosclerosis. No enlarged abdominal or pelvic lymph nodes. Reproductive: No mass or other abnormality. Other: Fat containing midline ventral hernia (series 2, image 70). Small volume free fluid in the low pelvis (series 2, image 106). Minimal peritoneal thickening and nodularity in the left upper quadrant (series 2, image 58) Musculoskeletal: No acute osseous findings. IMPRESSION: 1. Status post Whipple pancreaticoduodenectomy and reanastomosis with main pancreatic duct stent. 2. Unchanged appearance of soft tissue and decompressed bowel loops about the anastomosis, previously with abnormal FDG avidity. 3. Significantly increased infiltrative soft tissue about the adjacent central portion of the portal vein, which is narrowed, as well as the celiac axis, also with some degree of abnormal FDG avidity on prior examination. 4. Findings are consistent with local recurrence of pancreatic adenocarcinoma. 5. Minimal peritoneal thickening and nodularity in the left upper quadrant, new compared to prior examination, as well as small volume free fluid in the low pelvis, consistent with early peritoneal metastatic disease. 6. Previously described FDG avid lymph nodes are not enlarged by CT. 7. No evidence of distant metastatic disease in the chest. 8. Coronary artery disease. Aortic Atherosclerosis (ICD10-I70.0). Electronically Signed   By: Jearld Lesch M.D.   On: 02/01/2023 09:44    ASSESSMENT & PLAN:   Cancer of head of pancreas (HCC) # AUG 2024- STAGE IV/METASTATIC- Recurrent [No biopsy] Pancreas adenocarcinoma-   NGS-tempus-No targets noted however MSI-QNS.   Currently on single agent gemcitabine- s/p 3 ccyles of gemcitabine- OCT 29th, 2024- CT CAP- Status post Whipple pancreaticoduodenectomy and reanastomosis with main pancreatic duct stent;  Unchanged appearance of soft tissue and decompressed bowel loops bout the anastomosis, previously with abnormal FDG avidity; Significantly increased infiltrative soft  tissue about the adjacent central portion of the portal vein, which is narrowed, as well as the celiac axis, also with some degree of abnormal FDG avidity  on prior examination; Findings are consistent with local recurrence of pancreatic adenocarcinoma; Minimal peritoneal thickening and nodularity in the left upper quadrant, new compared to prior examination, as well as small volume free fluid in the low pelvis, consistent with early peritoneal metastatic disease.  Previously described FDG avid lymph nodes are not enlarged by CT;  No evidence of distant metastatic disease in the chest.  # I reviewed the findings of the CAT scan with the patient in detail concerning for progressive disease.  However recommend a PET scan for further  Evaluation. Also await tumor marker.   # I discussed given the likely progression recommend discontinuation of current chemotherapy recommend proceeding with NALIRI [ given PN G-2-3]; discussed the potential side effects including but not limited to diarrhea.  Sent a prescription for Lomotil. Will plan to start treatment approximately 2 weeks.  # Anemia- microcytic- Stable 9-10; NO IDA- ;  colo [EUS- 2021; oct 2022- colo; Dr.Anna]- stable.    # Severe hypokalemia potassium 2.8-.  Potassium 3.5. Continue K-Dur 20 mEq twice daily. Stable.    # PN--1-2- gapapetin increased to 600 mg-not well controlled;  recommend to start cymbal  # Hypertension-poorly controlled 180s]; Continue Norvasc/Metoprlol.[ per pt At home- 130/70s ;AGAIN RE-ITERATED to bring log. stable.  # mediport:  continue port flushes q 2-73M.stable.   # Anxiety/depression/insomnia: s/p Josh Borders- re: cymblata  # DISPOSITION:  # HOLD chemo today; de-access # PET scan ASAP  # follow up in 2 week-MD:  labs-port-cbc/cmp; ca 19-9; 5FU pump+ irinotecan [new]; pump off in after 2 days--Dr.B  # I reviewed the blood work- with the patient in detail; also reviewed the imaging independently [as summarized above];  and with the patient in detail.      Earna Coder, MD 02/22/2023

## 2023-02-22 NOTE — Assessment & Plan Note (Addendum)
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   Currently on single agent gemcitabine- s/p 3 ccyles of gemcitabine- OCT 29th, 2024- CT CAP- Status post Whipple pancreaticoduodenectomy and reanastomosis with main pancreatic duct stent;  Unchanged appearance of soft tissue and decompressed bowel loops bout the anastomosis, previously with abnormal FDG avidity; Significantly increased infiltrative soft tissue about the adjacent central portion of the portal vein, which is narrowed, as well as the celiac axis, also with some degree of abnormal FDG avidity on prior examination; Findings are consistent with local recurrence of pancreatic adenocarcinoma; Minimal peritoneal thickening and nodularity in the left upper quadrant, new compared to prior examination, as well as small volume free fluid in the low pelvis, consistent with early peritoneal metastatic disease.  Previously described FDG avid lymph nodes are not enlarged by CT;  No evidence of distant metastatic disease in the chest.  # I reviewed the findings of the CAT scan with the patient in detail concerning for progressive disease.  However recommend a PET scan for further  Evaluation. Also await tumor marker.   # I discussed given the likely progression recommend discontinuation of current chemotherapy recommend proceeding with NALIRI [ given PN G-2-3]; discussed the potential side effects including but not limited to diarrhea.  Sent a prescription for Lomotil. Will plan to start treatment approximately 2 weeks.  # Anemia- microcytic- Stable 9-10; NO IDA- ;  colo [EUS- 2021; oct 2022- colo; Dr.Anna]- stable.    # Severe hypokalemia potassium 2.8-.  Potassium 3.5. Continue K-Dur 20 mEq twice daily. Stable.    # PN--1-2- gapapetin increased to 600 mg-not well controlled;  recommend to start cymbal  # Hypertension-poorly controlled 180s]; Continue Norvasc/Metoprlol.[ per pt  At home- 130/70s ;AGAIN RE-ITERATED to bring log. stable.  # mediport:  continue port flushes q 2-76M.stable.   # Anxiety/depression/insomnia: s/p Josh Borders- re: cymblata  # DISPOSITION:  # HOLD chemo today; de-access # PET scan ASAP  # follow up in 2 week-MD:  labs-port-cbc/cmp; ca 19-9; 5FU pump+ irinotecan [new]; pump off in after 2 days--Dr.B  # I reviewed the blood work- with the patient in detail; also reviewed the imaging independently [as summarized above]; and with the patient in detail.

## 2023-02-22 NOTE — Progress Notes (Signed)
 No questions/concerns today.

## 2023-02-23 LAB — CANCER ANTIGEN 19-9: CA 19-9: 160 U/mL — ABNORMAL HIGH (ref 0–35)

## 2023-02-25 ENCOUNTER — Encounter
Admission: RE | Admit: 2023-02-25 | Discharge: 2023-02-25 | Disposition: A | Discharge status: Home / Self Care | Payer: 59 | Source: Ambulatory Visit | Attending: Internal Medicine | Admitting: Internal Medicine

## 2023-02-25 DIAGNOSIS — C250 Malignant neoplasm of head of pancreas: Secondary | ICD-10-CM | POA: Diagnosis present

## 2023-02-25 LAB — GLUCOSE, CAPILLARY: Glucose-Capillary: 116 mg/dL — ABNORMAL HIGH (ref 70–99)

## 2023-02-25 MED ORDER — FLUDEOXYGLUCOSE F - 18 (FDG) INJECTION
9.2000 | Freq: Once | INTRAVENOUS | Status: AC
  Administered 2023-02-25: 9.62 via INTRAVENOUS

## 2023-02-28 ENCOUNTER — Telehealth: Payer: 59 | Admitting: Hospice and Palliative Medicine

## 2023-03-01 ENCOUNTER — Inpatient Hospital Stay: Payer: 59

## 2023-03-03 ENCOUNTER — Inpatient Hospital Stay: Payer: 59 | Attending: Internal Medicine | Admitting: Hospice and Palliative Medicine

## 2023-03-03 DIAGNOSIS — E119 Type 2 diabetes mellitus without complications: Secondary | ICD-10-CM | POA: Insufficient documentation

## 2023-03-03 DIAGNOSIS — I10 Essential (primary) hypertension: Secondary | ICD-10-CM | POA: Insufficient documentation

## 2023-03-03 DIAGNOSIS — Z515 Encounter for palliative care: Secondary | ICD-10-CM

## 2023-03-03 DIAGNOSIS — Z5111 Encounter for antineoplastic chemotherapy: Secondary | ICD-10-CM | POA: Insufficient documentation

## 2023-03-03 DIAGNOSIS — Z79899 Other long term (current) drug therapy: Secondary | ICD-10-CM | POA: Insufficient documentation

## 2023-03-03 DIAGNOSIS — C250 Malignant neoplasm of head of pancreas: Secondary | ICD-10-CM | POA: Insufficient documentation

## 2023-03-03 DIAGNOSIS — F419 Anxiety disorder, unspecified: Secondary | ICD-10-CM | POA: Insufficient documentation

## 2023-03-03 DIAGNOSIS — R112 Nausea with vomiting, unspecified: Secondary | ICD-10-CM | POA: Insufficient documentation

## 2023-03-03 DIAGNOSIS — G4700 Insomnia, unspecified: Secondary | ICD-10-CM | POA: Insufficient documentation

## 2023-03-03 DIAGNOSIS — F32A Depression, unspecified: Secondary | ICD-10-CM | POA: Insufficient documentation

## 2023-03-03 DIAGNOSIS — Z452 Encounter for adjustment and management of vascular access device: Secondary | ICD-10-CM | POA: Insufficient documentation

## 2023-03-03 DIAGNOSIS — E876 Hypokalemia: Secondary | ICD-10-CM | POA: Insufficient documentation

## 2023-03-03 DIAGNOSIS — D509 Iron deficiency anemia, unspecified: Secondary | ICD-10-CM | POA: Insufficient documentation

## 2023-03-03 NOTE — Progress Notes (Signed)
Virtual Visit via Telephone Note  I connected with Robin Daniels on 03/03/23 at  3:20 PM EST by telephone and verified that I am speaking with the correct person using two identifiers.  Location: Patient: Home Provider: Clinic   I discussed the limitations, risks, security and privacy concerns of performing an evaluation and management service by telephone and the availability of in person appointments. I also discussed with the patient that there may be a patient responsible charge related to this service. The patient expressed understanding and agreed to proceed.   History of Present Illness: Robin Daniels is a 67 y.o. 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  Patient has had depression and anxiety.  She was referred to palliative care to address goals and manage ongoing symptoms.    Observations/Objective: I called and spoke with patient by phone.    Patient says she is doing well.  Denies any significant changes or concerns.  No symptomatic complaints at present.  Reports improved neuropathic symptoms on duloxetine/gabapentin.  Denies anxiety depression.  Reports stable appetite and performance status.  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 The patient was provided an opportunity to ask questions and all were answered. The patient agreed with the plan and demonstrated an understanding of the instructions.   The patient was advised to call back or seek an in-person evaluation if the symptoms worsen or if the condition fails to improve as anticipated.  I provided 10 minutes of non-face-to-face time during this encounter.   Malachy Moan, NP

## 2023-03-07 NOTE — Progress Notes (Signed)
Pharmacist Chemotherapy Monitoring - Initial Assessment    Anticipated start date: 03/14/23   The following has been reviewed per standard work regarding the patient's treatment regimen: The patient's diagnosis, treatment plan and drug doses, and organ/hematologic function Lab orders and baseline tests specific to treatment regimen  The treatment plan start date, drug sequencing, and pre-medications Prior authorization status  Patient's documented medication list, including drug-drug interaction screen and prescriptions for anti-emetics and supportive care specific to the treatment regimen The drug concentrations, fluid compatibility, administration routes, and timing of the medications to be used The patient's access for treatment and lifetime cumulative dose history, if applicable  The patient's medication allergies and previous infusion related reactions, if applicable   Changes made to treatment plan:  N/A  Follow up needed:  N/A   Sharen Hones, PharmD, BCPS Clinical Pharmacist   03/07/2023  3:13 PM

## 2023-03-08 ENCOUNTER — Encounter: Payer: Self-pay | Admitting: Internal Medicine

## 2023-03-10 ENCOUNTER — Ambulatory Visit: Payer: 59

## 2023-03-10 ENCOUNTER — Ambulatory Visit: Payer: 59 | Admitting: Internal Medicine

## 2023-03-10 ENCOUNTER — Other Ambulatory Visit: Payer: 59

## 2023-03-14 ENCOUNTER — Inpatient Hospital Stay: Payer: 59

## 2023-03-14 ENCOUNTER — Inpatient Hospital Stay (HOSPITAL_BASED_OUTPATIENT_CLINIC_OR_DEPARTMENT_OTHER): Payer: 59 | Admitting: Internal Medicine

## 2023-03-14 ENCOUNTER — Encounter: Payer: Self-pay | Admitting: Internal Medicine

## 2023-03-14 VITALS — BP 134/69 | HR 94 | Temp 96.2°F | Ht 64.0 in | Wt 166.6 lb

## 2023-03-14 DIAGNOSIS — I10 Essential (primary) hypertension: Secondary | ICD-10-CM | POA: Diagnosis not present

## 2023-03-14 DIAGNOSIS — Z5111 Encounter for antineoplastic chemotherapy: Secondary | ICD-10-CM | POA: Diagnosis present

## 2023-03-14 DIAGNOSIS — G893 Neoplasm related pain (acute) (chronic): Secondary | ICD-10-CM

## 2023-03-14 DIAGNOSIS — Z79899 Other long term (current) drug therapy: Secondary | ICD-10-CM | POA: Diagnosis not present

## 2023-03-14 DIAGNOSIS — D509 Iron deficiency anemia, unspecified: Secondary | ICD-10-CM | POA: Diagnosis not present

## 2023-03-14 DIAGNOSIS — E876 Hypokalemia: Secondary | ICD-10-CM | POA: Diagnosis not present

## 2023-03-14 DIAGNOSIS — E119 Type 2 diabetes mellitus without complications: Secondary | ICD-10-CM | POA: Diagnosis not present

## 2023-03-14 DIAGNOSIS — R112 Nausea with vomiting, unspecified: Secondary | ICD-10-CM | POA: Diagnosis not present

## 2023-03-14 DIAGNOSIS — R748 Abnormal levels of other serum enzymes: Secondary | ICD-10-CM

## 2023-03-14 DIAGNOSIS — F32A Depression, unspecified: Secondary | ICD-10-CM | POA: Diagnosis not present

## 2023-03-14 DIAGNOSIS — C250 Malignant neoplasm of head of pancreas: Secondary | ICD-10-CM | POA: Diagnosis present

## 2023-03-14 DIAGNOSIS — Z452 Encounter for adjustment and management of vascular access device: Secondary | ICD-10-CM | POA: Diagnosis not present

## 2023-03-14 DIAGNOSIS — F419 Anxiety disorder, unspecified: Secondary | ICD-10-CM | POA: Diagnosis not present

## 2023-03-14 DIAGNOSIS — G4700 Insomnia, unspecified: Secondary | ICD-10-CM | POA: Diagnosis not present

## 2023-03-14 LAB — CBC WITH DIFFERENTIAL (CANCER CENTER ONLY)
Abs Immature Granulocytes: 0.01 10*3/uL (ref 0.00–0.07)
Basophils Absolute: 0 10*3/uL (ref 0.0–0.1)
Basophils Relative: 1 %
Eosinophils Absolute: 0 10*3/uL (ref 0.0–0.5)
Eosinophils Relative: 1 %
HCT: 30.6 % — ABNORMAL LOW (ref 36.0–46.0)
Hemoglobin: 10 g/dL — ABNORMAL LOW (ref 12.0–15.0)
Immature Granulocytes: 0 %
Lymphocytes Relative: 27 %
Lymphs Abs: 1.4 10*3/uL (ref 0.7–4.0)
MCH: 26.3 pg (ref 26.0–34.0)
MCHC: 32.7 g/dL (ref 30.0–36.0)
MCV: 80.5 fL (ref 80.0–100.0)
Monocytes Absolute: 0.4 10*3/uL (ref 0.1–1.0)
Monocytes Relative: 9 %
Neutro Abs: 3.1 10*3/uL (ref 1.7–7.7)
Neutrophils Relative %: 62 %
Platelet Count: 258 10*3/uL (ref 150–400)
RBC: 3.8 MIL/uL — ABNORMAL LOW (ref 3.87–5.11)
RDW: 18 % — ABNORMAL HIGH (ref 11.5–15.5)
WBC Count: 5 10*3/uL (ref 4.0–10.5)
nRBC: 0 % (ref 0.0–0.2)

## 2023-03-14 LAB — CMP (CANCER CENTER ONLY)
ALT: 12 U/L (ref 0–44)
AST: 16 U/L (ref 15–41)
Albumin: 3.8 g/dL (ref 3.5–5.0)
Alkaline Phosphatase: 65 U/L (ref 38–126)
Anion gap: 11 (ref 5–15)
BUN: 8 mg/dL (ref 8–23)
CO2: 24 mmol/L (ref 22–32)
Calcium: 9.2 mg/dL (ref 8.9–10.3)
Chloride: 102 mmol/L (ref 98–111)
Creatinine: 0.73 mg/dL (ref 0.44–1.00)
GFR, Estimated: >60 mL/min (ref >60)
Glucose, Bld: 127 mg/dL — ABNORMAL HIGH (ref 70–99)
Potassium: 3.5 mmol/L (ref 3.5–5.1)
Sodium: 137 mmol/L (ref 135–145)
Total Bilirubin: 0.7 mg/dL (ref <1.2)
Total Protein: 7.8 g/dL (ref 6.5–8.1)

## 2023-03-14 MED ORDER — SODIUM CHLORIDE 0.9 % IV SOLN
68.0000 mg/m2 | Freq: Once | INTRAVENOUS | Status: AC
  Administered 2023-03-14: 129 mg via INTRAVENOUS
  Filled 2023-03-14: qty 30

## 2023-03-14 MED ORDER — ATROPINE SULFATE 1 MG/ML IV SOLN
0.5000 mg | Freq: Once | INTRAVENOUS | Status: AC | PRN
  Administered 2023-03-14: 0.5 mg via INTRAVENOUS
  Filled 2023-03-14: qty 1

## 2023-03-14 MED ORDER — SODIUM CHLORIDE 0.9 % IV SOLN
4500.0000 mg | INTRAVENOUS | Status: DC
  Administered 2023-03-14: 4500 mg via INTRAVENOUS
  Filled 2023-03-14: qty 90

## 2023-03-14 MED ORDER — SODIUM CHLORIDE 0.9 % IV SOLN
INTRAVENOUS | Status: DC
  Filled 2023-03-14: qty 250

## 2023-03-14 MED ORDER — DEXAMETHASONE SODIUM PHOSPHATE 10 MG/ML IJ SOLN
10.0000 mg | Freq: Once | INTRAMUSCULAR | Status: AC
  Administered 2023-03-14: 10 mg via INTRAVENOUS
  Filled 2023-03-14: qty 1

## 2023-03-14 MED ORDER — SODIUM CHLORIDE 0.9 % IV SOLN
400.0000 mg/m2 | Freq: Once | INTRAVENOUS | Status: AC
  Administered 2023-03-14: 764 mg via INTRAVENOUS
  Filled 2023-03-14: qty 38.2

## 2023-03-14 MED ORDER — SODIUM CHLORIDE 0.9 % IV SOLN: 2400.0000 mg/m2 | INTRAVENOUS | Status: DC

## 2023-03-14 MED ORDER — TRAMADOL HCL 50 MG PO TABS: 50.0000 mg | ORAL_TABLET | Freq: Three times a day (TID) | ORAL | 0 refills | Status: DC | PRN

## 2023-03-14 MED ORDER — OXYCODONE HCL 5 MG PO TABS
5.0000 mg | ORAL_TABLET | Freq: Once | ORAL | Status: AC
  Administered 2023-03-14: 5 mg via ORAL
  Filled 2023-03-14: qty 1

## 2023-03-14 MED ORDER — PALONOSETRON HCL INJECTION 0.25 MG/5ML
0.2500 mg | Freq: Once | INTRAVENOUS | Status: AC
  Administered 2023-03-14: 0.25 mg via INTRAVENOUS
  Filled 2023-03-14: qty 5

## 2023-03-14 NOTE — Progress Notes (Signed)
Per Dr. Donneta Romberg,  OK to use current BSA of 1.85 for 5FU. New 5FU pump dose reduced to 4500 mg ( 2400 mg/m2)   Sharen Hones, PharmD, BCPS Clinical Pharmacist

## 2023-03-14 NOTE — Patient Instructions (Addendum)
[facility] - A DEPT OF [facility]  Discharge Instructions: Thank you for choosing [facility] to provide your oncology and hematology care.  If you have a lab appointment with the Cancer Center, please go directly to the Cancer Center and check in at the registration area.  Wear comfortable clothing and clothing appropriate for easy access to any Portacath or PICC line.   We strive to give you quality time with your provider. You may need to reschedule your appointment if you arrive late (15 or more minutes).  Arriving late affects you and other patients whose appointments are after yours.  Also, if you miss three or more appointments without notifying the office, you may be dismissed from the clinic at the provider's discretion.      For prescription refill requests, have your pharmacy contact our office and allow 72 hours for refills to be completed.    Today you received the following chemotherapy and/or immunotherapy agents IRINOTECAN, LEUCOVORIN, 5FU      To help prevent nausea and vomiting after your treatment, we encourage you to take your nausea medication as directed.  BELOW ARE SYMPTOMS THAT SHOULD BE REPORTED IMMEDIATELY: *FEVER GREATER THAN 100.4 F (38 C) OR HIGHER *CHILLS OR SWEATING *NAUSEA AND VOMITING THAT IS NOT CONTROLLED WITH YOUR NAUSEA MEDICATION *UNUSUAL SHORTNESS OF BREATH *UNUSUAL BRUISING OR BLEEDING *URINARY PROBLEMS (pain or burning when urinating, or frequent urination) *BOWEL PROBLEMS (unusual diarrhea, constipation, pain near the anus) TENDERNESS IN MOUTH AND THROAT WITH OR WITHOUT PRESENCE OF ULCERS (sore throat, sores in mouth, or a toothache) UNUSUAL RASH, SWELLING OR PAIN  UNUSUAL VAGINAL DISCHARGE OR ITCHING   Items with * indicate a potential emergency and should be followed up as soon as possible or go to the Emergency Department if any problems should occur.  Please show the CHEMOTHERAPY ALERT CARD  or IMMUNOTHERAPY ALERT CARD at check-in to the Emergency Department and triage nurse.  Should you have questions after your visit or need to cancel or reschedule your appointment, please contact [facility] - A DEPT OF [facility]  [phone] and follow the prompts.  Office hours are 8:00 a.m. to 4:30 p.m. Monday - Friday. Please note that voicemails left after 4:00 p.m. may not be returned until the following business day.  We are closed weekends and major holidays. You have access to a nurse at all times for urgent questions. Please call the main number to the clinic [phone] and follow the prompts.  For any non-urgent questions, you may also contact your provider using MyChart. We now offer e-Visits for anyone 9 and older to request care online for non-urgent symptoms. For details visit [email or web address].   Also download the MyChart app! Go to the app store, search "MyChart", open the app, select , and log in with your MyChart username and password.  Irinotecan Liposome Injection What is this medication? IRINOTECAN LIPOSOME (eye ri noe TEE kan LIP oh som) treats pancreatic cancer. It works by slowing down the growth of cancer cells. This medicine may be used for other purposes; ask your health care provider or pharmacist if you have questions. COMMON BRAND NAME(S): ONIVYDE What should I tell my care team before I take this medication? They need to know if you have any of these conditions: Blockage in your bowels Dehydration Infection Low white blood cell levels Lung disease An unusual or allergic reaction to irinotecan  liposome, irinotecan, other medications, foods, dyes, or preservatives Pregnant or trying to get pregnant Breast-feeding How should I use this medication? This medication is injected into a vein. It is given by your care team in a hospital or clinic setting. Talk to your care team about the use of this medication in  children. Special care may be needed. Overdosage: If you think you have taken too much of this medicine contact a poison control center or emergency room at once. NOTE: This medicine is only for you. Do not share this medicine with others. What if I miss a dose? Keep appointments for follow-up doses. It is important not to miss your dose. Call your care team if you are unable to keep an appointment. What may interact with this medication? Do not take this medication with any of the following: Itraconazole This medication may also interact with the following: Certain antivirals for HIV or AIDS Certain medications for seizures, such as carbamazepine, fosphenytoin, phenytoin, phenobarbital Clarithromycin Gemfibrozil Nefazodone Rifabutin Rifampin Rifapentine St. John's Wort Voriconazole This list may not describe all possible interactions. Give your health care provider a list of all the medicines, herbs, non-prescription drugs, or dietary supplements you use. Also tell them if you smoke, drink alcohol, or use illegal drugs. Some items may interact with your medicine. What should I watch for while using this medication? This medication may make you feel generally unwell. This is not uncommon as chemotherapy can affect healthy cells as well as cancer cells. Report any side effects. Continue your course of treatment even though you feel ill unless your care team tells you to stop. You may need blood work while you are taking this medication. This medication can cause serious side effects and allergic reactions. To reduce your risk, your care team may give you other medications to take before receiving this one. Be sure to follow the directions from your care team. Check with your care team if you get an attack of diarrhea, nausea and vomiting, or if you sweat a lot. The loss of too much body fluid can make it dangerous for you to take this medication. This medication may cause infertility. Talk to  your care team if you are concerned about your fertility. Talk to your care team if you wish to become pregnant or if you think you might be pregnant. This medication can cause serious birth defects if taken during pregnancy or if you get pregnant within 7 months after stopping therapy. A negative pregnancy test is required before starting this medication. A reliable form of contraception is recommended while taking this medication and for 7 months after stopping it. Talk to your care team about reliable forms of contraception. Use a condom during sex and for 4 months after stopping therapy. Tell your care team right away if you think your partner might be pregnant. This medication can cause serious birth defects. Do not breast-feed while taking this medication and for 1 month after stopping therapy. This medication may increase your risk of getting an infection. Call your care team for advice if you get a fever, chills, sore throat, or other symptoms of a cold or flu. Do not treat yourself. Try to avoid being around people who are sick. Avoid taking medications that contain aspirin, acetaminophen, ibuprofen, naproxen, or ketoprofen unless instructed by your care team. These medications may hide a fever. Be careful brushing or flossing your teeth or using a toothpick because you may get an infection or bleed more easily. If you  have any dental work done, tell your dentist you are receiving this medication. What side effects may I notice from receiving this medication? Side effects that you should report to your care team as soon as possible: Allergic reactions or angioedema--skin rash, itching or hives, swelling of the face, eyes, lips, tongue, arms, or legs, trouble swallowing or breathing Dry cough, shortness of breath or trouble breathing Diarrhea Infection--fever, chills, cough, or sore throat Side effects that usually do not require medical attention (report to your care team if they continue or  are bothersome): Fatigue Loss of appetite Nausea Pain, redness, or swelling with sores inside the mouth or throat Vomiting This list may not describe all possible side effects. Call your doctor for medical advice about side effects. You may report side effects to FDA at 1-800-FDA-1088. Where should I keep my medication? This medication is given in a hospital or clinic. It will not be stored at home. NOTE: This sheet is a summary. It may not cover all possible information. If you have questions about this medicine, talk to your doctor, pharmacist, or health care provider.  2024 Elsevier/Gold Standard (2021-05-14 00:00:00)   Leucovorin Injection What is this medication? LEUCOVORIN (loo koe VOR in) prevents side effects from certain medications, such as methotrexate. It works by increasing folate levels. This helps protect healthy cells in your body. It may also be used to treat anemia caused by low levels of folate. It can also be used with fluorouracil, a type of chemotherapy, to treat colorectal cancer. It works by increasing the effects of fluorouracil in the body. This medicine may be used for other purposes; ask your health care provider or pharmacist if you have questions. What should I tell my care team before I take this medication? They need to know if you have any of these conditions: Anemia from low levels of vitamin B12 in the blood An unusual or allergic reaction to leucovorin, folic acid, other medications, foods, dyes, or preservatives Pregnant or trying to get pregnant Breastfeeding How should I use this medication? This medication is injected into a vein or a muscle. It is given by your care team in a hospital or clinic setting. Talk to your care team about the use of this medication in children. Special care may be needed. Overdosage: If you think you have taken too much of this medicine contact a poison control center or emergency room at once. NOTE: This medicine is only  for you. Do not share this medicine with others. What if I miss a dose? Keep appointments for follow-up doses. It is important not to miss your dose. Call your care team if you are unable to keep an appointment. What may interact with this medication? Capecitabine Fluorouracil Phenobarbital Phenytoin Primidone Trimethoprim;sulfamethoxazole This list may not describe all possible interactions. Give your health care provider a list of all the medicines, herbs, non-prescription drugs, or dietary supplements you use. Also tell them if you smoke, drink alcohol, or use illegal drugs. Some items may interact with your medicine. What should I watch for while using this medication? Your condition will be monitored carefully while you are receiving this medication. This medication may increase the side effects of 5-fluorouracil. Tell your care team if you have diarrhea or mouth sores that do not get better or that get worse. What side effects may I notice from receiving this medication? Side effects that you should report to your care team as soon as possible: Allergic reactions--skin rash, itching, hives, swelling of  the face, lips, tongue, or throat This list may not describe all possible side effects. Call your doctor for medical advice about side effects. You may report side effects to FDA at 1-800-FDA-1088. Where should I keep my medication? This medication is given in a hospital or clinic. It will not be stored at home. NOTE: This sheet is a summary. It may not cover all possible information. If you have questions about this medicine, talk to your doctor, pharmacist, or health care provider.  2024 Elsevier/Gold Standard (2021-08-18 00:00:00)    Fluorouracil Injection What is this medication? FLUOROURACIL (flure oh YOOR a sil) treats some types of cancer. It works by slowing down the growth of cancer cells. This medicine may be used for other purposes; ask your health care provider or  pharmacist if you have questions. COMMON BRAND NAME(S): Adrucil What should I tell my care team before I take this medication? They need to know if you have any of these conditions: Blood disorders Dihydropyrimidine dehydrogenase (DPD) deficiency Infection, such as chickenpox, cold sores, herpes Kidney disease Liver disease Poor nutrition Recent or ongoing radiation therapy An unusual or allergic reaction to fluorouracil, other medications, foods, dyes, or preservatives If you or your partner are pregnant or trying to get pregnant Breast-feeding How should I use this medication? This medication is injected into a vein. It is administered by your care team in a hospital or clinic setting. Talk to your care team about the use of this medication in children. Special care may be needed. Overdosage: If you think you have taken too much of this medicine contact a poison control center or emergency room at once. NOTE: This medicine is only for you. Do not share this medicine with others. What if I miss a dose? Keep appointments for follow-up doses. It is important not to miss your dose. Call your care team if you are unable to keep an appointment. What may interact with this medication? Do not take this medication with any of the following: Live virus vaccines This medication may also interact with the following: Medications that treat or prevent blood clots, such as warfarin, enoxaparin, dalteparin This list may not describe all possible interactions. Give your health care provider a list of all the medicines, herbs, non-prescription drugs, or dietary supplements you use. Also tell them if you smoke, drink alcohol, or use illegal drugs. Some items may interact with your medicine. What should I watch for while using this medication? Your condition will be monitored carefully while you are receiving this medication. This medication may make you feel generally unwell. This is not uncommon as  chemotherapy can affect healthy cells as well as cancer cells. Report any side effects. Continue your course of treatment even though you feel ill unless your care team tells you to stop. In some cases, you may be given additional medications to help with side effects. Follow all directions for their use. This medication may increase your risk of getting an infection. Call your care team for advice if you get a fever, chills, sore throat, or other symptoms of a cold or flu. Do not treat yourself. Try to avoid being around people who are sick. This medication may increase your risk to bruise or bleed. Call your care team if you notice any unusual bleeding. Be careful brushing or flossing your teeth or using a toothpick because you may get an infection or bleed more easily. If you have any dental work done, tell your dentist you are receiving this  medication. Avoid taking medications that contain aspirin, acetaminophen, ibuprofen, naproxen, or ketoprofen unless instructed by your care team. These medications may hide a fever. Do not treat diarrhea with over the counter products. Contact your care team if you have diarrhea that lasts more than 2 days or if it is severe and watery. This medication can make you more sensitive to the sun. Keep out of the sun. If you cannot avoid being in the sun, wear protective clothing and sunscreen. Do not use sun lamps, tanning beds, or tanning booths. Talk to your care team if you or your partner wish to become pregnant or think you might be pregnant. This medication can cause serious birth defects if taken during pregnancy and for 3 months after the last dose. A reliable form of contraception is recommended while taking this medication and for 3 months after the last dose. Talk to your care team about effective forms of contraception. Do not father a child while taking this medication and for 3 months after the last dose. Use a condom while having sex during this time  period. Do not breastfeed while taking this medication. This medication may cause infertility. Talk to your care team if you are concerned about your fertility. What side effects may I notice from receiving this medication? Side effects that you should report to your care team as soon as possible: Allergic reactions--skin rash, itching, hives, swelling of the face, lips, tongue, or throat Heart attack--pain or tightness in the chest, shoulders, arms, or jaw, nausea, shortness of breath, cold or clammy skin, feeling faint or lightheaded Heart failure--shortness of breath, swelling of the ankles, feet, or hands, sudden weight gain, unusual weakness or fatigue Heart rhythm changes--fast or irregular heartbeat, dizziness, feeling faint or lightheaded, chest pain, trouble breathing High ammonia level--unusual weakness or fatigue, confusion, loss of appetite, nausea, vomiting, seizures Infection--fever, chills, cough, sore throat, wounds that don't heal, pain or trouble when passing urine, general feeling of discomfort or being unwell Low red blood cell level--unusual weakness or fatigue, dizziness, headache, trouble breathing Pain, tingling, or numbness in the hands or feet, muscle weakness, change in vision, confusion or trouble speaking, loss of balance or coordination, trouble walking, seizures Redness, swelling, and blistering of the skin over hands and feet Severe or prolonged diarrhea Unusual bruising or bleeding Side effects that usually do not require medical attention (report to your care team if they continue or are bothersome): Dry skin Headache Increased tears Nausea Pain, redness, or swelling with sores inside the mouth or throat Sensitivity to light Vomiting This list may not describe all possible side effects. Call your doctor for medical advice about side effects. You may report side effects to FDA at 1-800-FDA-1088. Where should I keep my medication? This medication is given in  a hospital or clinic. It will not be stored at home. NOTE: This sheet is a summary. It may not cover all possible information. If you have questions about this medicine, talk to your doctor, pharmacist, or health care provider.  2024 Elsevier/Gold Standard (2021-07-21 00:00:00)

## 2023-03-14 NOTE — Progress Notes (Signed)
[facility] CONSULT NOTE  Patient Care Team: Entzminger, Danton Clap, MD as PCP - General (Internal Medicine) Toy Cookey, FNP (Family Medicine) Jim Like, RN as Registered Nurse Scarlett Presto, RN (Inactive) as Registered Nurse Benita Gutter, RN as Oncology Nurse Navigator Almond Lint, MD as Consulting Physician (General Surgery) Earna Coder, MD as Consulting Physician (Internal Medicine)  CHIEF COMPLAINTS/PURPOSE OF CONSULTATION: pancreas adenocarcinoma  Oncology History Overview Note  #Pancreas adenocarcinoma-Stage IB- [id number]-[EUS- Dr.Spaete; [facility]/GI; mass 22x38mm; invading/abutting superior mesenteric vein; 2 enlarged lymph nodes peripancreatic/porta hepatis largest 10 x 5.8 mm-nonpathologic based on EUS criteria/no biopsy]-borderline resectable.  PET scan no evidence of distant metastatic disease.  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 Fredirick Maudlin September]  # 01/02/2020-FOLFIRINOX; Neulasta. S/p FOLFIRINOX 10 cycles- April 19th whipples-  s/p neoadjuvant FOLFIRINX. #10 cycles]-s/p Whipple's [on April 19th 2022]- ypT2 [3.5cm]; pN0/11;   DIS-CONTINUED  FOLFIRINOX cycle #11 & 12-  Sec to PN-2-3.  PANCREAS (EXOCRINE), CARCINOMA: Resection  Procedure: Whipple procedure  Tumor Site: Head of pancreas.  Tumor Size: 3.5 cm, slide measurement.  Histologic Type: Pancreatic ductal adenocarcinoma.  Histologic Grade: Moderately differentiated.  Tumor Extension: Into peripancreatic connective tissue.  Treatment Effect: Moderate treatment effect.  Lymphovascular Invasion: Not identified.  Perineural Invasion: Present.  Margins: All surgical margins are negative for carcinoma.  Regional Lymph Nodes:       Number of Lymph Nodes with Tumor: 0       Number of Lymph Nodes Examined: 15  Distant Metastasis:       Distant  Site(s) Involved: Not applicable.  Pathologic Stage Classification (pTNM, AJCC 8th Edition): ypT2, ypN0   # Borderline DM; HTN   AUG 18th, 2024- PET scan- Whipple procedure with abnormal hypermetabolism centered at the pancreaticojejunostomy, worrisome for disease recurrence. Associated pancreaticojejunostomy stent in place; Hypermetabolic adjacent retroperitoneal lymph nodes, worrisome for metastatic disease. Ca 19-9 rising.    SEP 3rd, 2024- single gem [3 weeks on 1 week off]-because of neuropathy.  # OCT 29th, 2024-CT scan abdomen pelvis-progression.  # DEC 2024- start NALIRI.     Cancer of head of pancreas (HCC)  12/04/2019 Initial Diagnosis   Cancer of head of pancreas (HCC)   12/19/2019 - 12/19/2019 Chemotherapy   The patient had PACLitaxel-protein bound (ABRAXANE) chemo infusion 250 mg, 125 mg/m2 = 250 mg, Intravenous,  Once, 1 of 4 cycles Administration: 250 mg (12/19/2019) gemcitabine (GEMZAR) 2,000 mg in sodium chloride 0.9 % 250 mL chemo infusion, 1,976 mg, Intravenous,  Once, 1 of 4 cycles Administration: 2,000 mg (12/19/2019)  for chemotherapy treatment.    01/02/2020 - 05/15/2020 Chemotherapy   Patient is on Treatment Plan : PANCREAS Modified FOLFIRINOX q14d x 4 cycles     11/30/2022 - 02/08/2023 Chemotherapy   Patient is on Treatment Plan : PANCREAS Gemcitabine D1,8, (1000) q21d     03/14/2023 -  Chemotherapy   Patient is on Treatment Plan : PANCREAS Liposomal Irinotecan + Leucovorin + 5-FU IVCI q14d      HISTORY OF PRESENTING ILLNESS: Patient ambulating-independently.  Alone.   Robin Daniels 67 y.o.  female history recurrent stage IV  pancreatic adeno ca currently on single agent gemcitabine chemo therapy-history reviewed the results of the recent PET scan.  c/o back pain 8/10, not sure what she did. Not sleeping good due to the pain, x2 weeks.  Patient denies any worsening abdominal pain.  Having occasional nausea and vomiting.   Patient continues to have tingling  and numbness in the extremities- currently on gabapentin not significantly better or worse.   No fever no chills. Weight is stable.   Review of Systems  Constitutional:  Negative for chills, diaphoresis, fever and malaise/fatigue.  HENT:  Negative for nosebleeds and sore throat.   Eyes:  Negative for double vision.  Respiratory:  Negative for cough, hemoptysis, sputum production, shortness of breath and wheezing.   Cardiovascular:  Negative for chest pain, palpitations, orthopnea and leg swelling.  Gastrointestinal:  Negative for abdominal pain, blood in stool, constipation, diarrhea, heartburn, melena, nausea and vomiting.  Genitourinary:  Negative for dysuria, frequency and urgency.  Musculoskeletal:  Negative for back pain and joint pain.  Skin: Negative.  Negative for itching and rash.  Neurological:  Positive for tingling. Negative for dizziness, focal weakness, weakness and headaches.  Endo/Heme/Allergies:  Does not bruise/bleed easily.  Psychiatric/Behavioral:  Negative for depression. The patient is not nervous/anxious and does not have insomnia.      MEDICAL HISTORY:  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/04/2019   Complication of anesthesia    Fluttering heart    Hyperlipidemia    Hypertension    PONV (postoperative nausea and vomiting)    Pre-diabetes    Vertigo    none recently    SURGICAL HISTORY: Past Surgical History:  Procedure Laterality Date   CATARACT EXTRACTION W/PHACO Right 06/09/2021   Procedure: CATARACT EXTRACTION PHACO AND INTRAOCULAR LENS PLACEMENT (IOC) RIGHT malyugin;  Surgeon: Galen Manila, MD;  Location: [facility];  Service: Ophthalmology;  Laterality: Right;  12.40 1:07.0   COLONOSCOPY WITH PROPOFOL N/A 01/13/2021   Procedure: COLONOSCOPY WITH PROPOFOL;  Surgeon: Wyline Mood, MD;  Location: [facility];  Service: Gastroenterology;   Laterality: N/A;   ENDOSCOPIC RETROGRADE CHOLANGIOPANCREATOGRAPHY (ERCP) WITH PROPOFOL N/A 11/16/2019   Procedure: ENDOSCOPIC RETROGRADE CHOLANGIOPANCREATOGRAPHY (ERCP) WITH PROPOFOL;  Surgeon: Midge Minium, MD;  Location: [facility] ENDOSCOPY;  Service: Endoscopy;  Laterality: N/A;   ERCP N/A 03/27/2020   Procedure: ENDOSCOPIC RETROGRADE CHOLANGIOPANCREATOGRAPHY (ERCP);  Surgeon: Midge Minium, MD;  Location: [facility] ENDOSCOPY;  Service: Endoscopy;  Laterality: N/A;   EUS N/A 11/29/2019   Procedure: FULL UPPER ENDOSCOPIC ULTRASOUND (EUS) RADIAL;  Surgeon: Doren Custard, MD;  Location: [facility] ENDOSCOPY;  Service: Gastroenterology;  Laterality: N/A;   IR FLUORO RM 30-60 MIN  07/30/2020   IR IMAGING GUIDED PORT INSERTION  12/14/2019   JEJUNOSTOMY Left 07/15/2020   Procedure: Rance Muir;  Surgeon: Almond Lint, MD;  Location: [facility] OR;  Service: General;  Laterality: Left;   LAPAROSCOPY N/A 07/15/2020   Procedure: DIAGNOSTIC LAPAROSCOPY;  Surgeon: Almond Lint, MD;  Location: [facility] OR;  Service: General;  Laterality: N/A;   right knee replacement Right [id number]   SHOULDER ARTHROSCOPY W/ ROTATOR CUFF REPAIR Right    WHIPPLE PROCEDURE N/A 07/15/2020   Procedure: WHIPPLE PROCEDURE;  Surgeon: Almond Lint, MD;  Location: [facility] OR;  Service: General;  Laterality: N/A;    SOCIAL HISTORY: Social History   Socioeconomic History   Marital status: Divorced    Spouse name: Not on file   Number of children: Not on file   Years of education: Not on file   Highest education level: Not on file  Occupational History   Not on file  Tobacco Use   Smoking status: Never  Smokeless tobacco: Never  Vaping Use   Vaping status: Never Used  Substance and Sexual Activity   Alcohol use: No    Alcohol/week: 0.0 standard drinks of alcohol   Drug use: No   Sexual activity: Never  Other Topics Concern   Not on file  Social History Narrative   ** Merged History Encounter **       Lives in [place]; with mom and son; worked in  dietary; never smoked; no alcohol.    Social Drivers of Corporate investment banker Strain: Low Risk  (12/01/2022)   Overall Financial Resource Strain (CARDIA)    Difficulty of Paying Living Expenses: Not hard at all  Food Insecurity: No Food Insecurity (12/01/2022)   Hunger Vital Sign    Worried About Running Out of Food in the Last Year: Never true    Ran Out of Food in the Last Year: Never true  Transportation Needs: No Transportation Needs (12/01/2022)   PRAPARE - Administrator, Civil Service (Medical): No    Lack of Transportation (Non-Medical): No  Physical Activity: Not on file  Stress: Not on file  Social Connections: Not on file  Intimate Partner Violence: Not At Risk (12/01/2022)   Humiliation, Afraid, Rape, and Kick questionnaire    Fear of Current or Ex-Partner: No    Emotionally Abused: No    Physically Abused: No    Sexually Abused: No    FAMILY HISTORY: Family History  Problem Relation Age of Onset   Diabetes Mother    Hyperlipidemia Mother    Hypertension Mother    Vision loss Mother    Arthritis Father    Hyperlipidemia Father    Hypertension Father    Breast cancer Maternal Aunt     ALLERGIES:  has no known allergies.  MEDICATIONS:  Current Outpatient Medications  Medication Sig Dispense Refill   acetaminophen (TYLENOL) 325 MG tablet Take 650 mg by mouth every 6 (six) hours as needed for moderate pain.     amLODipine (NORVASC) 10 MG tablet Take 1 tablet (10 mg total) by mouth daily. 30 tablet 0   Calcium Carbonate-Vit D-Min (CALCIUM 600+D PLUS MINERALS PO) Take 1 tablet by mouth in the morning and at bedtime.     diphenoxylate-atropine (LOMOTIL) 2.5-0.025 MG tablet Take 1 tablet by mouth 4 (four) times daily as needed for diarrhea or loose stools. Take it along with immodium 60 tablet 0   DULoxetine (CYMBALTA) 30 MG capsule Take 1 capsule (30 mg total) by mouth daily. 30 capsule 3   Ferrous Bisglycinate Chelate 28 MG CAPS Take 1 tablet by mouth  daily.     gabapentin (NEURONTIN) 300 MG capsule Take 300 mg by mouth daily.     lidocaine-prilocaine (EMLA) cream Apply on the port. 30 -45 min  prior to port access. 30 g 3   lisinopril (ZESTRIL) 10 MG tablet Take 10 mg by mouth daily.     metoprolol succinate (TOPROL-XL) 50 MG 24 hr tablet Take 1 tablet (50 mg total) by mouth daily. Take with or immediately following a meal. 30 tablet 0   ondansetron (ZOFRAN) 8 MG tablet One pill every 8 hours as needed for nausea/vomitting. 40 tablet 1   potassium chloride SA (KLOR-CON M) 20 MEQ tablet Take 1 tablet (20 mEq total) by mouth 2 (two) times daily. 60 tablet 1   prochlorperazine (COMPAZINE) 10 MG tablet Take 1 tablet (10 mg total) by mouth every 6 (six) hours as needed for nausea or  vomiting. 40 tablet 1   traMADol (ULTRAM) 50 MG tablet Take 1 tablet (50 mg total) by mouth every 8 (eight) hours as needed. 90 tablet 0   vitamin B-12 (CYANOCOBALAMIN) 1000 MCG tablet Take 1 tablet (1,000 mcg total) by mouth daily.     No current facility-administered medications for this visit.   Facility-Administered Medications Ordered in Other Visits  Medication Dose Route Frequency Provider Last Rate Last Admin   0.9 %  sodium chloride infusion   Intravenous Continuous Earna Coder, MD 10 mL/hr at 03/14/23 1012 New Bag at 03/14/23 1012   atropine injection 0.5 mg  0.5 mg Intravenous Once PRN Earna Coder, MD       fluorouracil (ADRUCIL) 4,500 mg in sodium chloride 0.9 % 60 mL chemo infusion  4,500 mg Intravenous 1 day or 1 dose Earna Coder, MD       irinotecan LIPOSOME (ONIVYDE) 129 mg in sodium chloride 0.9 % 500 mL chemo infusion  68 mg/m2 (Treatment Plan Recorded) Intravenous Once Earna Coder, MD       leucovorin 764 mg in sodium chloride 0.9 % 250 mL infusion  400 mg/m2 (Treatment Plan Recorded) Intravenous Once Louretta Shorten R, MD       sodium chloride flush (NS) 0.9 % injection 10 mL  10 mL Intravenous PRN  Earna Coder, MD   10 mL at 06/23/21 1012      PHYSICAL EXAMINATION: ECOG PERFORMANCE STATUS: 0 - Asymptomatic  Vitals:   03/14/23 0856 03/14/23 0911  BP: (!) 159/81 134/69  Pulse: 94   Temp: (!) 96.2 F (35.7 C)   SpO2: 98%        Filed Weights   03/14/23 0856  Weight: 166 lb 9.6 oz (75.6 kg)      Physical Exam Vitals reviewed.  Constitutional:      Appearance: She is not ill-appearing.  HENT:     Head: Normocephalic and atraumatic.     Mouth/Throat:     Pharynx: No oropharyngeal exudate.  Eyes:     General: No scleral icterus. Cardiovascular:     Rate and Rhythm: Normal rate and regular rhythm.  Pulmonary:     Effort: Pulmonary effort is normal. No respiratory distress.     Breath sounds: No wheezing.  Abdominal:     General: There is no distension.     Palpations: Abdomen is soft.     Tenderness: There is no abdominal tenderness. There is no guarding.  Musculoskeletal:        General: No tenderness or deformity.  Lymphadenopathy:     Cervical: No cervical adenopathy.  Skin:    General: Skin is warm.     Coloration: Skin is not pale.  Neurological:     Mental Status: She is alert and oriented to person, place, and time.  Psychiatric:        Mood and Affect: Mood and affect normal.        Behavior: Behavior normal.    LABORATORY DATA:  I have reviewed the data as listed Lab Results  Component Value Date   WBC 5.0 03/14/2023   HGB 10.0 (L) 03/14/2023   HCT 30.6 (L) 03/14/2023   MCV 80.5 03/14/2023   PLT 258 03/14/2023   Recent Labs    02/08/23 0942 02/22/23 1234 03/14/23 0838  NA 136 138 137  K 3.3* 4.1 3.5  CL 102 104 102  CO2 24 23 24   GLUCOSE 166* 108* 127*  BUN 9 9 8  CREATININE 0.79 0.83 0.73  CALCIUM 8.9 8.6* 9.2  GFRNONAA >60 >60 >60  PROT 7.3 7.0 7.8  ALBUMIN 3.6 3.6 3.8  AST 53* 19 16  ALT 48* 15 12  ALKPHOS 94 79 65  BILITOT 0.3 0.5 0.7   Component Ref Range & Units 1 mo ago (06/25/22) 4 mo ago (03/26/22) 6  mo ago (01/22/22) 8 mo ago (11/23/21) 1 yr ago (06/23/21) 1 yr ago (04/20/21) 1 yr ago (01/23/21)  CA 19-9 0 - 35 U/mL 24 21 CM 17 CM 12 CM 14 CM 13 CM 16 CM     RADIOGRAPHIC STUDIES: I have personally reviewed the radiological images as listed and agreed with the findings in the report. NM PET Image Restage (PS) Skull Base to Thigh (F-18 FDG) Result Date: 03/02/2023 CLINICAL DATA:  Subsequent treatment strategy for pancreatic cancer. EXAM: NUCLEAR MEDICINE PET SKULL BASE TO THIGH TECHNIQUE: 9.6 mCi F-18 FDG was injected intravenously. Full-ring PET imaging was performed from the skull base to thigh after the radiotracer. CT data was obtained and used for attenuation correction and anatomic localization. Fasting blood glucose: 116 mg/dl COMPARISON:  PET-CT 46/96/2952 FINDINGS: NECK: No hypermetabolic lymph nodes in the neck. Incidental CT findings: None. CHEST: Incidental CT findings: No hypermetabolic mediastinal or hilar nodes. No suspicious pulmonary nodules on the CT scan. Ovoid sub pleural fat density collection in the RIGHT lower lobe measures 6.6 by 1.9 cm. No metabolic activity. Benign lipoma. No suspicious pulmonary nodules ABDOMEN/PELVIS: Very intense metabolic activity at the junction the pancreas and duodenum with SUV max 8.4 compared SUV max equal 7.6. No significant change. Hypermetabolic tissue medial to the head of the pancreas adjacent to the SMA with SUV max equal 7.3 compared SUV max equal 6.7. The volume of tissue involved appears slightly greater (image 83). Focus metabolic activity just ventral to the aorta at the level of the takeoff of the SMA with SUV max equal 5.4 on image 86 is new from prior. Soft tissue nodule soft tissue suggesting adenopathy (image 86/series 6). No focal abnormal activity in the liver to suggest liver metastasis. No evidence metastatic disease in the pelvis. Hypermetabolic omental peritoneal metastasis Incidental CT findings: Post Whipple procedure.  SKELETON: No focal hypermetabolic activity to suggest skeletal metastasis. Incidental CT findings: None. IMPRESSION: 1. Intense metabolic activity at the junction of the pancreas and duodenum not changed from prior. Differential includes postsurgical inflammation versus tumor recurrence. 2. New hypermetabolic tissue position between the pancreas in the SMA is concerning for nodal metastasis. 3. New smaller focus hypermotility just RIGHT of the takeoff of the SMA is also concerning for nodal metastasis. 4. overall concern for progression of local peripancreatic nodal metastasis. Electronically Signed   By: Genevive Bi M.D.   On: 03/02/2023 14:32    ASSESSMENT & PLAN:   Cancer of head of pancreas (HCC) # AUG 2024- STAGE IV/METASTATIC- Recurrent [No biopsy] Pancreas adenocarcinoma-   NGS-tempus-No targets noted however MSI-QNS.   Currently on single agent gemcitabine- s/p 3 ccyles of gemcitabine- OCT 29th, 2024- CT CAP- Status post Whipple pancreaticoduodenectomy and reanastomosis with main pancreatic duct stent;  Unchanged appearance of soft tissue and decompressed bowel loops bout the anastomosis, previously with abnormal FDG avidity; Significantly increased infiltrative soft tissue about the adjacent central portion of the portal vein, which is narrowed, as well as the celiac axis, also with some degree of abnormal FDG avidity on prior examination; Findings are consistent with local recurrence of pancreatic adenocarcinoma; Minimal peritoneal thickening and nodularity in  the left upper quadrant, new compared to prior examination, as well as small volume free fluid in the low pelvis, consistent with early peritoneal metastatic disease.  Previously described FDG avid lymph nodes are not enlarged by CT;  No evidence of distant metastatic disease in the chest. PET scan:  Intense metabolic activity at the junction of the pancreas and duodenum not changed from prior. New hypermetabolic tissue position between  the pancreas in the SMA is concerning for nodal metastasis;  New smaller focus hypermotility just RIGHT of the takeoff of the SMA is also concerning for nodal metastasis; . overall concern for progression of local peripancreatic nodal metastasis.  # Proceed with NALIRI cycle #1 today. Labs-CBC/chemistries were reviewed with the patient. Will plan imaging after 3- 4 cycles.  Will monitor tumor marker closely.   # c/o back pain 8/10- secondary to progressive disease: start tramadol prn; if not improved recommend oxycodone.   # Anemia- microcytic- Stable 9-10; NO IDA- ;  colo [EUS- 2021; oct 2022- colo; Dr.Anna]- stable.    # Severe hypokalemia potassium 2.8-.  Potassium 3.5. Continue K-Dur 20 mEq twice daily. Stable.    # PN--1-2- gapapetin increased to 600 mg-not well controlled;  recommend to start cymbal  # Hypertension-poorly controlled 180s]; Continue Norvasc/Metoprlol.[ per pt At home- 130/70s ;AGAIN RE-ITERATED to bring log. stable.  # mediport:  continue port flushes q 2-37M.stable.   # Anxiety/depression/insomnia: s/p Josh Borders- re: cymblata  # DISPOSITION:  # Follow up in 2 week-X- MD/Dr.A:  labs-port-cbc/cmp; ca 19-9; 5FU pump+ nab-irinotecan - pump off in after 2 days-- # follow up in 4  week-MD:  labs-port-cbc/cmp; ca 19-9; 5FU pump+ nab-irinotecan [new]; pump off in after 2 days--Dr.B  # I reviewed the blood work- with the patient in detail; also reviewed the imaging independently [as summarized above]; and with the patient in detail.    Earna Coder, MD 03/14/2023

## 2023-03-14 NOTE — Progress Notes (Signed)
C/o back pain 8/10, not sure what she did. Not sleeping good due to the pain, x2 weeks.  Having occasional nausea and vomiting.   PET 02/25/23.

## 2023-03-14 NOTE — Assessment & Plan Note (Addendum)
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   Currently on single agent gemcitabine- s/p 3 ccyles of gemcitabine- OCT 29th, 2024- CT CAP- Status post Whipple pancreaticoduodenectomy and reanastomosis with main pancreatic duct stent;  Unchanged appearance of soft tissue and decompressed bowel loops bout the anastomosis, previously with abnormal FDG avidity; Significantly increased infiltrative soft tissue about the adjacent central portion of the portal vein, which is narrowed, as well as the celiac axis, also with some degree of abnormal FDG avidity on prior examination; Findings are consistent with local recurrence of pancreatic adenocarcinoma; Minimal peritoneal thickening and nodularity in the left upper quadrant, new compared to prior examination, as well as small volume free fluid in the low pelvis, consistent with early peritoneal metastatic disease.  Previously described FDG avid lymph nodes are not enlarged by CT;  No evidence of distant metastatic disease in the chest. PET scan:  Intense metabolic activity at the junction of the pancreas and duodenum not changed from prior. New hypermetabolic tissue position between the pancreas in the SMA is concerning for nodal metastasis;  New smaller focus hypermotility just RIGHT of the takeoff of the SMA is also concerning for nodal metastasis; . overall concern for progression of local peripancreatic nodal metastasis.  # Proceed with NALIRI cycle #1 today. Labs-CBC/chemistries were reviewed with the patient. Will plan imaging after 3- 4 cycles.  Will monitor tumor marker closely.   # c/o back pain 8/10- secondary to progressive disease: start tramadol prn; if not improved recommend oxycodone.   # Anemia- microcytic- Stable 9-10; NO IDA- ;  colo [EUS- 2021; oct 2022- colo; Dr.Anna]- stable.    # Severe hypokalemia potassium 2.8-.  Potassium 3.5. Continue K-Dur 20 mEq twice  daily. Stable.    # PN--1-2- gapapetin increased to 600 mg-not well controlled;  recommend to start cymbal  # Hypertension-poorly controlled 180s]; Continue Norvasc/Metoprlol.[ per pt At home- 130/70s ;AGAIN RE-ITERATED to bring log. stable.  # mediport:  continue port flushes q 2-4M.stable.   # Anxiety/depression/insomnia: s/p Josh Borders- re: cymblata  # DISPOSITION:  # Follow up in 2 week-X- MD/Dr.A:  labs-port-cbc/cmp; ca 19-9; 5FU pump+ nab-irinotecan - pump off in after 2 days-- # follow up in 4  week-MD:  labs-port-cbc/cmp; ca 19-9; 5FU pump+ nab-irinotecan [new]; pump off in after 2 days--Dr.B  # I reviewed the blood work- with the patient in detail; also reviewed the imaging independently [as summarized above]; and with the patient in detail.

## 2023-03-15 LAB — CANCER ANTIGEN 19-9: CA 19-9: 451 U/mL — ABNORMAL HIGH (ref 0–35)

## 2023-03-16 ENCOUNTER — Inpatient Hospital Stay: Payer: 59

## 2023-03-16 VITALS — BP 140/57 | HR 96 | Temp 98.0°F | Resp 18

## 2023-03-16 DIAGNOSIS — C250 Malignant neoplasm of head of pancreas: Secondary | ICD-10-CM

## 2023-03-16 DIAGNOSIS — Z5111 Encounter for antineoplastic chemotherapy: Secondary | ICD-10-CM | POA: Diagnosis not present

## 2023-03-16 MED ORDER — HEPARIN SOD (PORK) LOCK FLUSH 100 UNIT/ML IV SOLN
500.0000 [IU] | Freq: Once | INTRAVENOUS | Status: AC | PRN
Start: 2023-03-16 — End: 2023-03-16
  Administered 2023-03-16: 500 [IU]
  Filled 2023-03-16: qty 5

## 2023-03-16 MED ORDER — SODIUM CHLORIDE 0.9% FLUSH
10.0000 mL | INTRAVENOUS | Status: DC | PRN
  Administered 2023-03-16: 10 mL
  Filled 2023-03-16: qty 10

## 2023-03-29 ENCOUNTER — Inpatient Hospital Stay (HOSPITAL_BASED_OUTPATIENT_CLINIC_OR_DEPARTMENT_OTHER): Payer: 59 | Admitting: Nurse Practitioner

## 2023-03-29 ENCOUNTER — Telehealth: Payer: Self-pay | Admitting: Internal Medicine

## 2023-03-29 ENCOUNTER — Inpatient Hospital Stay: Payer: 59

## 2023-03-29 ENCOUNTER — Encounter: Payer: Self-pay | Admitting: Internal Medicine

## 2023-03-29 ENCOUNTER — Encounter: Payer: Self-pay | Admitting: Nurse Practitioner

## 2023-03-29 VITALS — BP 143/86 | HR 104 | Temp 96.2°F | Wt 165.0 lb

## 2023-03-29 DIAGNOSIS — Z5111 Encounter for antineoplastic chemotherapy: Secondary | ICD-10-CM | POA: Diagnosis not present

## 2023-03-29 DIAGNOSIS — C250 Malignant neoplasm of head of pancreas: Secondary | ICD-10-CM | POA: Diagnosis not present

## 2023-03-29 LAB — CMP (CANCER CENTER ONLY)
ALT: 12 U/L (ref 0–44)
AST: 20 U/L (ref 15–41)
Albumin: 3.2 g/dL — ABNORMAL LOW (ref 3.5–5.0)
Alkaline Phosphatase: 66 U/L (ref 38–126)
Anion gap: 11 (ref 5–15)
BUN: 7 mg/dL — ABNORMAL LOW (ref 8–23)
CO2: 22 mmol/L (ref 22–32)
Calcium: 8.7 mg/dL — ABNORMAL LOW (ref 8.9–10.3)
Chloride: 103 mmol/L (ref 98–111)
Creatinine: 0.93 mg/dL (ref 0.44–1.00)
GFR, Estimated: >60 mL/min (ref >60)
Glucose, Bld: 176 mg/dL — ABNORMAL HIGH (ref 70–99)
Potassium: 2.8 mmol/L — ABNORMAL LOW (ref 3.5–5.1)
Sodium: 136 mmol/L (ref 135–145)
Total Bilirubin: 0.3 mg/dL (ref 0.0–1.2)
Total Protein: 7 g/dL (ref 6.5–8.1)

## 2023-03-29 LAB — CBC WITH DIFFERENTIAL (CANCER CENTER ONLY)
Abs Immature Granulocytes: 0 10*3/uL (ref 0.00–0.07)
Basophils Absolute: 0 10*3/uL (ref 0.0–0.1)
Basophils Relative: 1 %
Eosinophils Absolute: 0.1 10*3/uL (ref 0.0–0.5)
Eosinophils Relative: 3 %
HCT: 27.8 % — ABNORMAL LOW (ref 36.0–46.0)
Hemoglobin: 8.9 g/dL — ABNORMAL LOW (ref 12.0–15.0)
Immature Granulocytes: 0 %
Lymphocytes Relative: 54 %
Lymphs Abs: 1.1 10*3/uL (ref 0.7–4.0)
MCH: 26 pg (ref 26.0–34.0)
MCHC: 32 g/dL (ref 30.0–36.0)
MCV: 81.3 fL (ref 80.0–100.0)
Monocytes Absolute: 0.3 10*3/uL (ref 0.1–1.0)
Monocytes Relative: 13 %
Neutro Abs: 0.6 10*3/uL — ABNORMAL LOW (ref 1.7–7.7)
Neutrophils Relative %: 29 %
Platelet Count: 279 10*3/uL (ref 150–400)
RBC: 3.42 MIL/uL — ABNORMAL LOW (ref 3.87–5.11)
RDW: 16.5 % — ABNORMAL HIGH (ref 11.5–15.5)
WBC Count: 2.1 10*3/uL — ABNORMAL LOW (ref 4.0–10.5)
nRBC: 0 % (ref 0.0–0.2)

## 2023-03-29 MED ORDER — HEPARIN SOD (PORK) LOCK FLUSH 100 UNIT/ML IV SOLN
500.0000 [IU] | Freq: Once | INTRAVENOUS | Status: AC
Start: 2023-03-29 — End: 2023-03-29
  Administered 2023-03-29: 500 [IU] via INTRAVENOUS
  Filled 2023-03-29: qty 5

## 2023-03-29 MED ORDER — POTASSIUM CHLORIDE CRYS ER 20 MEQ PO TBCR: 20.0000 meq | EXTENDED_RELEASE_TABLET | Freq: Three times a day (TID) | ORAL | Status: DC

## 2023-03-29 NOTE — Progress Notes (Signed)
 Nutrition  RD was scheduled to see patient during infusion but infusion cancelled for today.    Ryson Bacha B. Freida Busman, RD, LDN Registered Dietitian [phone]

## 2023-03-29 NOTE — Progress Notes (Signed)
 [facility] CONSULT NOTE  Patient Care Team: Entzminger, Ethridge LABOR, MD as PCP - General (Internal Medicine) Wilbert Perkins, FNP (Family Medicine) Cindie Jesusa HERO, RN as Registered Nurse Dannielle Arlean FALCON, RN (Inactive) as Registered Nurse Maurie Rayfield BIRCH, RN as Oncology Nurse Navigator Aron Shoulders, MD as Consulting Physician (General Surgery) Rennie Cindy SAUNDERS, MD as Consulting Physician (Internal Medicine)  CHIEF COMPLAINTS/PURPOSE OF CONSULTATION: pancreas adenocarcinoma  Oncology History Overview Note  #Pancreas adenocarcinoma-Stage IB- [id number]-[EUS- Dr.Spaete; [facility]/GI; mass 22x82mm; invading/abutting superior mesenteric vein; 2 enlarged lymph nodes peripancreatic/porta hepatis largest 10 x 5.8 mm-nonpathologic based on EUS criteria/no biopsy]-borderline resectable.  PET scan no evidence of distant metastatic disease.  #Biliary obstruction status post ERCP and stenting [Dr.Wohl]-  # SEP, 22,2021- GEM-ABRAXANE  s/p cycle 1-d1 [discontinued secondary to shortage]; s/p evaluation with Dr. Aron kast September]  # 01/02/2020-FOLFIRINOX; Neulasta . S/p FOLFIRINOX 10 cycles- April 19th whipples-  s/p neoadjuvant FOLFIRINX. #10 cycles]-s/p Whipple's [on April 19th 2022]- ypT2 [3.5cm]; pN0/11;   DIS-CONTINUED  FOLFIRINOX cycle #11 & 12-  Sec to PN-2-3.  PANCREAS (EXOCRINE), CARCINOMA: Resection  Procedure: Whipple procedure  Tumor Site: Head of pancreas.  Tumor Size: 3.5 cm, slide measurement.  Histologic Type: Pancreatic ductal adenocarcinoma.  Histologic Grade: Moderately differentiated.  Tumor Extension: Into peripancreatic connective tissue.  Treatment Effect: Moderate treatment effect.  Lymphovascular Invasion: Not identified.  Perineural Invasion: Present.  Margins: All surgical margins are negative for carcinoma.  Regional Lymph Nodes:       Number of Lymph Nodes with Tumor: 0       Number of Lymph Nodes Examined: 15  Distant Metastasis:       Distant  Site(s) Involved: Not applicable.  Pathologic Stage Classification (pTNM, AJCC 8th Edition): ypT2, ypN0   # Borderline DM; HTN   AUG 18th, 2024- PET scan- Whipple procedure with abnormal hypermetabolism centered at the pancreaticojejunostomy, worrisome for disease recurrence. Associated pancreaticojejunostomy stent in place; Hypermetabolic adjacent retroperitoneal lymph nodes, worrisome for metastatic disease. Ca 19-9 rising.    SEP 3rd, 2024- single gem [3 weeks on 1 week off]-because of neuropathy.  # OCT 29th, 2024-CT scan abdomen pelvis-progression.  # DEC 2024- start NALIRI.     Cancer of head of pancreas (HCC)  12/04/2019 Initial Diagnosis   Cancer of head of pancreas (HCC)   12/19/2019 - 12/19/2019 Chemotherapy   The patient had PACLitaxel -protein bound (ABRAXANE ) chemo infusion 250 mg, 125 mg/m2 = 250 mg, Intravenous,  Once, 1 of 4 cycles Administration: 250 mg (12/19/2019) gemcitabine  (GEMZAR ) 2,000 mg in sodium chloride  0.9 % 250 mL chemo infusion, 1,976 mg, Intravenous,  Once, 1 of 4 cycles Administration: 2,000 mg (12/19/2019)  for chemotherapy treatment.    01/02/2020 - 05/15/2020 Chemotherapy   Patient is on Treatment Plan : PANCREAS Modified FOLFIRINOX q14d x 4 cycles     11/30/2022 - 02/08/2023 Chemotherapy   Patient is on Treatment Plan : PANCREAS Gemcitabine  D1,8, (1000) q21d     03/14/2023 -  Chemotherapy   Patient is on Treatment Plan : PANCREAS Liposomal Irinotecan  + Leucovorin  + 5-FU IVCI q14d      HISTORY OF PRESENTING ILLNESS: Patient ambulating-independently.  Alone.   Robin Daniels 67 y.o.  female with recurrent stage IV  pancreatic adenocarcinoma, on second line  NALIRI, cycle 1 on 03/14/23, who returns to clinic for consideration of cycle 2. She feels well. Energy has improved in past couple of days. Pain is stable. Denies worsening abdominal pain. Weight is stable.   Review of Systems  Constitutional:  Negative for chills, diaphoresis, fever and  malaise/fatigue.  HENT:  Negative for nosebleeds and sore throat.   Eyes:  Negative for double vision.  Respiratory:  Negative for cough, hemoptysis, sputum production, shortness of breath and wheezing.   Cardiovascular:  Negative for chest pain, palpitations, orthopnea and leg swelling.  Gastrointestinal:  Negative for abdominal pain, blood in stool, constipation, diarrhea, heartburn, melena, nausea and vomiting.  Genitourinary:  Negative for dysuria, frequency and urgency.  Musculoskeletal:  Negative for back pain and joint pain.  Skin: Negative.  Negative for itching and rash.  Neurological:  Positive for tingling. Negative for dizziness, focal weakness, weakness and headaches.  Endo/Heme/Allergies:  Does not bruise/bleed easily.  Psychiatric/Behavioral:  Negative for depression. The patient is not nervous/anxious and does not have insomnia.      MEDICAL HISTORY:  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/04/2019   Complication of anesthesia    Fluttering heart    Hyperlipidemia    Hypertension    PONV (postoperative nausea and vomiting)    Pre-diabetes    Vertigo    none recently    SURGICAL HISTORY: Past Surgical History:  Procedure Laterality Date   CATARACT EXTRACTION W/PHACO Right 06/09/2021   Procedure: CATARACT EXTRACTION PHACO AND INTRAOCULAR LENS PLACEMENT (IOC) RIGHT malyugin;  Surgeon: Jaye Fallow, MD;  Location: [facility];  Service: Ophthalmology;  Laterality: Right;  12.40 1:07.0   COLONOSCOPY WITH PROPOFOL  N/A 01/13/2021   Procedure: COLONOSCOPY WITH PROPOFOL ;  Surgeon: Therisa Bi, MD;  Location[facility] END[facility]COPY;  Service: Gastroenterology;  Laterality: N/A;   ENDOSCOPIC RETROGRADE CHOLANGIOPANCREATOGRAPHY (ERCP) WITH PROPOFOL  N/A 11/16/2019   Procedure: ENDOSCOPIC RETROGRADE CHOLANGIOPANCREATOGRAPHY (ERCP) WITH PROPOFOL ;  Surgeon: Jinny Carmine, MD;   Location: [facility] ENDOSCOPY;  Service: Endoscopy;  Laterality: N/A;   ERCP N/A 03/27/2020   Procedure: ENDOSCOPIC RETROGRADE CHOLANGIOPANCREATOGRAPHY (ERCP);  Surgeon: Jinny Carmine, MD;  Location: [facility] ENDOSCOPY;  Service: Endoscopy;  Laterality: N/A;   EUS N/A 11/29/2019   Procedure: FULL UPPER ENDOSCOPIC ULTRASOUND (EUS) RADIAL;  Surgeon: Elta Fonda SQUIBB, MD;  Location: [facility] ENDOSCOPY;  Service: Gastroenterology;  Laterality: N/A;   IR FLUORO RM 30-60 MIN  07/30/2020   IR IMAGING GUIDED PORT INSERTION  12/14/2019   JEJUNOSTOMY Left 07/15/2020   Procedure: DEXTER;  Surgeon: Aron Shoulders, MD;  Location: [facility] OR;  Service: General;  Laterality: Left;   LAPAROSCOPY N/A 07/15/2020   Procedure: DIAGNOSTIC LAPAROSCOPY;  Surgeon: Aron Shoulders, MD;  Location: [facility] OR;  Service: General;  Laterality: N/A;   right knee replacement Right [id number]   SHOULDER ARTHROSCOPY W/ ROTATOR CUFF REPAIR Right    WHIPPLE PROCEDURE N/A 07/15/2020   Procedure: WHIPPLE PROCEDURE;  Surgeon: Aron Shoulders, MD;  Location: [facility] OR;  Service: General;  Laterality: N/A;    SOCIAL HISTORY: Social History   Socioeconomic History   Marital status: Divorced    Spouse name: Not on file   Number of children: Not on file   Years of education: Not on file   Highest education level: Not on file  Occupational History   Not on file  Tobacco Use   Smoking status: Never   Smokeless tobacco: Never  Vaping Use   Vaping status: Never Used  Substance and Sexual Activity   Alcohol use: No    Alcohol/week: 0.0 standard drinks of alcohol   Drug use: No   Sexual  activity: Never  Other Topics Concern   Not on file  Social History Narrative   ** Merged History Encounter **       Lives in [place]; with mom and son; worked in dietary; never smoked; no alcohol.    Social Drivers of Corporate Investment Banker Strain: Low Risk  (12/01/2022)   Overall Financial Resource Strain (CARDIA)    Difficulty of Paying Living Expenses: Not hard  at all  Food Insecurity: No Food Insecurity (12/01/2022)   Hunger Vital Sign    Worried About Running Out of Food in the Last Year: Never true    Ran Out of Food in the Last Year: Never true  Transportation Needs: No Transportation Needs (12/01/2022)   PRAPARE - Administrator, Civil Service (Medical): No    Lack of Transportation (Non-Medical): No  Physical Activity: Not on file  Stress: Not on file  Social Connections: Not on file  Intimate Partner Violence: Not At Risk (12/01/2022)   Humiliation, Afraid, Rape, and Kick questionnaire    Fear of Current or Ex-Partner: No    Emotionally Abused: No    Physically Abused: No    Sexually Abused: No    FAMILY HISTORY: Family History  Problem Relation Age of Onset   Diabetes Mother    Hyperlipidemia Mother    Hypertension Mother    Vision loss Mother    Arthritis Father    Hyperlipidemia Father    Hypertension Father    Breast cancer Maternal Aunt     ALLERGIES:  has no known allergies.  MEDICATIONS:  Current Outpatient Medications  Medication Sig Dispense Refill   acetaminophen  (TYLENOL ) 325 MG tablet Take 650 mg by mouth every 6 (six) hours as needed for moderate pain.     amLODipine  (NORVASC ) 10 MG tablet Take 1 tablet (10 mg total) by mouth daily. 30 tablet 0   Calcium  Carbonate-Vit D-Min (CALCIUM  600+D PLUS MINERALS PO) Take 1 tablet by mouth in the morning and at bedtime.     diphenoxylate -atropine  (LOMOTIL ) 2.5-0.025 MG tablet Take 1 tablet by mouth 4 (four) times daily as needed for diarrhea or loose stools. Take it along with immodium 60 tablet 0   DULoxetine  (CYMBALTA ) 30 MG capsule Take 1 capsule (30 mg total) by mouth daily. 30 capsule 3   Ferrous Bisglycinate  Chelate 28 MG CAPS Take 1 tablet by mouth daily.     gabapentin  (NEURONTIN ) 300 MG capsule Take 300 mg by mouth daily.     lidocaine -prilocaine  (EMLA ) cream Apply on the port. 30 -45 min  prior to port access. 30 g 3   lisinopril  (ZESTRIL ) 10 MG tablet  Take 10 mg by mouth daily.     metoprolol  succinate (TOPROL -XL) 50 MG 24 hr tablet Take 1 tablet (50 mg total) by mouth daily. Take with or immediately following a meal. 30 tablet 0   ondansetron  (ZOFRAN ) 8 MG tablet One pill every 8 hours as needed for nausea/vomitting. 40 tablet 1   potassium chloride  SA (KLOR-CON  M) 20 MEQ tablet Take 1 tablet (20 mEq total) by mouth 2 (two) times daily. 60 tablet 1   prochlorperazine  (COMPAZINE ) 10 MG tablet Take 1 tablet (10 mg total) by mouth every 6 (six) hours as needed for nausea or vomiting. 40 tablet 1   traMADol  (ULTRAM ) 50 MG tablet Take 1 tablet (50 mg total) by mouth every 8 (eight) hours as needed. 90 tablet 0   vitamin B-12 (CYANOCOBALAMIN ) 1000 MCG tablet Take 1 tablet (  1,000 mcg total) by mouth daily.     No current facility-administered medications for this visit.   Facility-Administered Medications Ordered in Other Visits  Medication Dose Route Frequency Provider Last Rate Last Admin   heparin  lock flush 100 unit/mL  500 Units Intravenous Once Dasie Tinnie MATSU, NP       sodium chloride  flush (NS) 0.9 % injection 10 mL  10 mL Intravenous PRN Brahmanday, Govinda R, MD   10 mL at 06/23/21 1012   sodium chloride  flush (NS) 0.9 % injection 10 mL  10 mL Intracatheter PRN Brahmanday, Govinda R, MD   10 mL at 03/16/23 1311      PHYSICAL EXAMINATION: ECOG PERFORMANCE STATUS: 0 - Asymptomatic  Vitals:   03/29/23 1025  BP: (!) 143/86  Pulse: (!) 104  Temp: (!) 96.2 F (35.7 C)  SpO2: 100%   Filed Weights   03/29/23 1025  Weight: 165 lb (74.8 kg)   Physical Exam Vitals reviewed.  Constitutional:      Appearance: She is not ill-appearing.  HENT:     Head: Normocephalic and atraumatic.  Cardiovascular:     Rate and Rhythm: Normal rate and regular rhythm.  Pulmonary:     Effort: Pulmonary effort is normal. No respiratory distress.     Breath sounds: No wheezing.  Abdominal:     General: There is no distension.     Palpations:  Abdomen is soft.     Tenderness: There is no abdominal tenderness. There is no guarding.  Musculoskeletal:        General: No tenderness or deformity.  Skin:    General: Skin is warm.     Coloration: Skin is not pale.  Neurological:     Mental Status: She is alert and oriented to person, place, and time.  Psychiatric:        Mood and Affect: Mood and affect normal.        Behavior: Behavior normal.    LABORATORY DATA:  I have reviewed the data as listed Lab Results  Component Value Date   WBC 2.1 (L) 03/29/2023   HGB 8.9 (L) 03/29/2023   HCT 27.8 (L) 03/29/2023   MCV 81.3 03/29/2023   PLT 279 03/29/2023   Recent Labs    02/22/23 1234 03/14/23 0838 03/29/23 1008  NA 138 137 136  K 4.1 3.5 2.8*  CL 104 102 103  CO2 23 24 22   GLUCOSE 108* 127* 176*  BUN 9 8 7*  CREATININE 0.83 0.73 0.93  CALCIUM  8.6* 9.2 8.7*  GFRNONAA >60 >60 >60  PROT 7.0 7.8 7.0  ALBUMIN  3.6 3.8 3.2*  AST 19 16 20   ALT 15 12 12   ALKPHOS 79 65 66  BILITOT 0.5 0.7 0.3   Component Ref Range & Units (hover) 2 wk ago (03/14/23) 1 mo ago (02/22/23) 2 mo ago (01/11/23) 3 mo ago (12/21/22) 3 mo ago (11/30/22) 5 mo ago (10/29/22) 6 mo ago (09/22/22)  CA 19-9 451 High  160 High  CM 88 High  CM 87 High  CM 116 High  CM 88 High  CM 65 High  CM  Comment: (NOTE)     RADIOGRAPHIC STUDIES: I have personally reviewed the radiological images as listed and agreed with the findings in the report. No results found.   ASSESSMENT & PLAN:   Cancer of head of pancreas  # AUG 2024- STAGE IV/METASTATIC- Recurrent [No biopsy] Pancreas adenocarcinoma-   NGS-tempus-No targets noted however MSI-QNS.   Currently on single agent  gemcitabine - s/p 3 ccyles of gemcitabine - OCT 29th, 2024- CT CAP- Status post Whipple pancreaticoduodenectomy and reanastomosis with main pancreatic duct stent;  Unchanged appearance of soft tissue and decompressed bowel loops bout the anastomosis, previously with abnormal FDG avidity;  Significantly increased infiltrative soft tissue about the adjacent central portion of the portal vein, which is narrowed, as well as the celiac axis, also with some degree of abnormal FDG avidity on prior examination; Findings are consistent with local recurrence of pancreatic adenocarcinoma; Minimal peritoneal thickening and nodularity in the left upper quadrant, new compared to prior examination, as well as small volume free fluid in the low pelvis, consistent with early peritoneal metastatic disease.  Previously described FDG avid lymph nodes are not enlarged by CT;  No evidence of distant metastatic disease in the chest. PET scan:  Intense metabolic activity at the junction of the pancreas and duodenum not changed from prior. New hypermetabolic tissue position between the pancreas in the SMA is concerning for nodal metastasis;  New smaller focus hypermotility just RIGHT of the takeoff of the SMA is also concerning for nodal metastasis; . overall concern for progression of local peripancreatic nodal metastasis. Started second line NALIRI on 03/14/23.    # Hold cycle 2 of NALIRI due to neutropenia. ANC 0.6. Plan to reevaluate. Plan to re-image after 3-4 cycles. Added udenyca  for D3C2. RTC next week for reevaluation and consideration of C2.    # c/o back pain 8/10 - secondary to progressive disease: start tramadol  prn; if not improved recommend oxycodone .    # Anemia- microcytic- Baseline 9-10. NO IDA. Colo [EUS- 2021; oct 2022- colo; Dr.Anna]. Hmg 8.9. Monitor.    # Severe hypokalemia potassium 2.8 again. Increase potassium 20 meq to three times a day.    # PN--1-2- gapapetin increased to 600 mg-not well controlled; start cymbalta    # Hypertension-poorly controlled 180s]; Continue Norvasc /Metoprolol . Per pt at home- 130/70s. Encouraged home log.    # Mediport:  continue port flushes q 2-87M.stable.    # Anxiety/depression/insomnia: s/p Josh Borders- re: cymblata   # DISPOSITION:  Deaccess Hold  tx today Defer to next week (can move back to Dr Damaris schedule if available) Add GCSF on D3 when she gets pump off- la   No problem-specific Assessment & Plan notes found for this encounter.  Tinnie KANDICE Dawn, NP 03/29/2023

## 2023-03-29 NOTE — Telephone Encounter (Signed)
Called patient to inform of next appointments- no answer phone just rings and rings-

## 2023-03-30 LAB — CANCER ANTIGEN 19-9: CA 19-9: 393 U/mL — ABNORMAL HIGH (ref 0–35)

## 2023-03-31 ENCOUNTER — Inpatient Hospital Stay: Payer: 59

## 2023-04-04 ENCOUNTER — Encounter: Payer: Self-pay | Admitting: Internal Medicine

## 2023-04-04 ENCOUNTER — Inpatient Hospital Stay: Payer: 59 | Attending: Internal Medicine

## 2023-04-04 ENCOUNTER — Inpatient Hospital Stay: Payer: 59

## 2023-04-04 ENCOUNTER — Inpatient Hospital Stay (HOSPITAL_BASED_OUTPATIENT_CLINIC_OR_DEPARTMENT_OTHER): Payer: 59 | Admitting: Internal Medicine

## 2023-04-04 VITALS — BP 145/80 | HR 91 | Temp 97.4°F | Resp 17 | Wt 167.0 lb

## 2023-04-04 DIAGNOSIS — D509 Iron deficiency anemia, unspecified: Secondary | ICD-10-CM | POA: Diagnosis not present

## 2023-04-04 DIAGNOSIS — R109 Unspecified abdominal pain: Secondary | ICD-10-CM | POA: Diagnosis not present

## 2023-04-04 DIAGNOSIS — Z452 Encounter for adjustment and management of vascular access device: Secondary | ICD-10-CM | POA: Diagnosis not present

## 2023-04-04 DIAGNOSIS — R200 Anesthesia of skin: Secondary | ICD-10-CM | POA: Insufficient documentation

## 2023-04-04 DIAGNOSIS — G4700 Insomnia, unspecified: Secondary | ICD-10-CM | POA: Diagnosis not present

## 2023-04-04 DIAGNOSIS — I10 Essential (primary) hypertension: Secondary | ICD-10-CM | POA: Diagnosis not present

## 2023-04-04 DIAGNOSIS — C250 Malignant neoplasm of head of pancreas: Secondary | ICD-10-CM | POA: Insufficient documentation

## 2023-04-04 DIAGNOSIS — M549 Dorsalgia, unspecified: Secondary | ICD-10-CM | POA: Insufficient documentation

## 2023-04-04 DIAGNOSIS — Z5111 Encounter for antineoplastic chemotherapy: Secondary | ICD-10-CM | POA: Diagnosis present

## 2023-04-04 DIAGNOSIS — F32A Depression, unspecified: Secondary | ICD-10-CM | POA: Diagnosis not present

## 2023-04-04 DIAGNOSIS — E876 Hypokalemia: Secondary | ICD-10-CM | POA: Diagnosis not present

## 2023-04-04 DIAGNOSIS — Z79899 Other long term (current) drug therapy: Secondary | ICD-10-CM | POA: Insufficient documentation

## 2023-04-04 DIAGNOSIS — Z5189 Encounter for other specified aftercare: Secondary | ICD-10-CM | POA: Insufficient documentation

## 2023-04-04 DIAGNOSIS — R202 Paresthesia of skin: Secondary | ICD-10-CM | POA: Diagnosis not present

## 2023-04-04 LAB — CBC WITH DIFFERENTIAL (CANCER CENTER ONLY)
Abs Immature Granulocytes: 0.02 10*3/uL (ref 0.00–0.07)
Basophils Absolute: 0 10*3/uL (ref 0.0–0.1)
Basophils Relative: 1 %
Eosinophils Absolute: 0 10*3/uL (ref 0.0–0.5)
Eosinophils Relative: 1 %
HCT: 28 % — ABNORMAL LOW (ref 36.0–46.0)
Hemoglobin: 9 g/dL — ABNORMAL LOW (ref 12.0–15.0)
Immature Granulocytes: 1 %
Lymphocytes Relative: 35 %
Lymphs Abs: 1.5 10*3/uL (ref 0.7–4.0)
MCH: 25.9 pg — ABNORMAL LOW (ref 26.0–34.0)
MCHC: 32.1 g/dL (ref 30.0–36.0)
MCV: 80.5 fL (ref 80.0–100.0)
Monocytes Absolute: 0.4 10*3/uL (ref 0.1–1.0)
Monocytes Relative: 9 %
Neutro Abs: 2.2 10*3/uL (ref 1.7–7.7)
Neutrophils Relative %: 53 %
Platelet Count: 272 10*3/uL (ref 150–400)
RBC: 3.48 MIL/uL — ABNORMAL LOW (ref 3.87–5.11)
RDW: 16.7 % — ABNORMAL HIGH (ref 11.5–15.5)
WBC Count: 4.1 10*3/uL (ref 4.0–10.5)
nRBC: 0 % (ref 0.0–0.2)

## 2023-04-04 LAB — CMP (CANCER CENTER ONLY)
ALT: 10 U/L (ref 0–44)
AST: 13 U/L — ABNORMAL LOW (ref 15–41)
Albumin: 3.1 g/dL — ABNORMAL LOW (ref 3.5–5.0)
Alkaline Phosphatase: 81 U/L (ref 38–126)
Anion gap: 8 (ref 5–15)
BUN: 9 mg/dL (ref 8–23)
CO2: 25 mmol/L (ref 22–32)
Calcium: 8.5 mg/dL — ABNORMAL LOW (ref 8.9–10.3)
Chloride: 104 mmol/L (ref 98–111)
Creatinine: 0.75 mg/dL (ref 0.44–1.00)
GFR, Estimated: >60 mL/min (ref >60)
Glucose, Bld: 120 mg/dL — ABNORMAL HIGH (ref 70–99)
Potassium: 3.2 mmol/L — ABNORMAL LOW (ref 3.5–5.1)
Sodium: 137 mmol/L (ref 135–145)
Total Bilirubin: 0.5 mg/dL (ref 0.0–1.2)
Total Protein: 6.6 g/dL (ref 6.5–8.1)

## 2023-04-04 MED ORDER — SODIUM CHLORIDE 0.9 % IV SOLN
400.0000 mg/m2 | Freq: Once | INTRAVENOUS | Status: AC
  Administered 2023-04-04: 764 mg via INTRAVENOUS
  Filled 2023-04-04 (×2): qty 38.2

## 2023-04-04 MED ORDER — DEXAMETHASONE SODIUM PHOSPHATE 10 MG/ML IJ SOLN
10.0000 mg | Freq: Once | INTRAMUSCULAR | Status: AC
  Administered 2023-04-04: 10 mg via INTRAVENOUS
  Filled 2023-04-04: qty 1

## 2023-04-04 MED ORDER — PALONOSETRON HCL INJECTION 0.25 MG/5ML
0.2500 mg | Freq: Once | INTRAVENOUS | Status: AC
  Administered 2023-04-04: 0.25 mg via INTRAVENOUS
  Filled 2023-04-04: qty 5

## 2023-04-04 MED ORDER — SODIUM CHLORIDE 0.9% FLUSH
10.0000 mL | INTRAVENOUS | Status: DC | PRN
  Filled 2023-04-04: qty 10

## 2023-04-04 MED ORDER — HEPARIN SOD (PORK) LOCK FLUSH 100 UNIT/ML IV SOLN
500.0000 [IU] | Freq: Once | INTRAVENOUS | Status: DC | PRN
  Filled 2023-04-04: qty 5

## 2023-04-04 MED ORDER — IRINOTECAN HCL LIPOSOME CHEMO INJECTION 43 MG/10ML
68.0000 mg/m2 | INJECTION | Freq: Once | INTRAVENOUS | Status: AC
  Administered 2023-04-04: 129 mg via INTRAVENOUS
  Filled 2023-04-04: qty 30

## 2023-04-04 MED ORDER — SODIUM CHLORIDE 0.9 % IV SOLN
2350.0000 mg/m2 | INTRAVENOUS | Status: DC
  Administered 2023-04-04: 4500 mg via INTRAVENOUS
  Filled 2023-04-04: qty 90

## 2023-04-04 MED ORDER — ATROPINE SULFATE 1 MG/ML IV SOLN
0.5000 mg | Freq: Once | INTRAVENOUS | Status: AC | PRN
  Administered 2023-04-04: 0.5 mg via INTRAVENOUS
  Filled 2023-04-04: qty 1

## 2023-04-04 MED ORDER — SODIUM CHLORIDE 0.9 % IV SOLN
INTRAVENOUS | Status: DC
  Filled 2023-04-04: qty 250

## 2023-04-04 NOTE — Progress Notes (Signed)
 Feeling well. Offers no complaints today.

## 2023-04-04 NOTE — Progress Notes (Signed)
 [facility] CONSULT NOTE  Patient Care Team: Entzminger, Ethridge LABOR, MD as PCP - General (Internal Medicine) Wilbert Perkins, FNP (Family Medicine) Cindie Jesusa HERO, RN as Registered Nurse Dannielle Arlean FALCON, RN (Inactive) as Registered Nurse Maurie Rayfield BIRCH, RN as Oncology Nurse Navigator Aron Shoulders, MD as Consulting Physician (General Surgery) Rennie Cindy SAUNDERS, MD as Consulting Physician (Internal Medicine)  CHIEF COMPLAINTS/PURPOSE OF CONSULTATION: pancreas adenocarcinoma  Oncology History Overview Note  #Pancreas adenocarcinoma-Stage IB- [id number]-[EUS- Dr.Spaete; [facility]/GI; mass 22x85mm; invading/abutting superior mesenteric vein; 2 enlarged lymph nodes peripancreatic/porta hepatis largest 10 x 5.8 mm-nonpathologic based on EUS criteria/no biopsy]-borderline resectable.  PET scan no evidence of distant metastatic disease.  #Biliary obstruction status post ERCP and stenting [Dr.Wohl]-  # SEP, 22,2021- GEM-ABRAXANE  s/p cycle 1-d1 [discontinued secondary to shortage]; s/p evaluation with Dr. Aron kast September]  # 01/02/2020-FOLFIRINOX; Neulasta . S/p FOLFIRINOX 10 cycles- April 19th whipples-  s/p neoadjuvant FOLFIRINX. #10 cycles]-s/p Whipple's [on April 19th 2022]- ypT2 [3.5cm]; pN0/11;   DIS-CONTINUED  FOLFIRINOX cycle #11 & 12-  Sec to PN-2-3.  PANCREAS (EXOCRINE), CARCINOMA: Resection  Procedure: Whipple procedure  Tumor Site: Head of pancreas.  Tumor Size: 3.5 cm, slide measurement.  Histologic Type: Pancreatic ductal adenocarcinoma.  Histologic Grade: Moderately differentiated.  Tumor Extension: Into peripancreatic connective tissue.  Treatment Effect: Moderate treatment effect.  Lymphovascular Invasion: Not identified.  Perineural Invasion: Present.  Margins: All surgical margins are negative for carcinoma.  Regional Lymph Nodes:       Number of Lymph Nodes with Tumor: 0       Number of Lymph Nodes Examined: 15  Distant Metastasis:       Distant  Site(s) Involved: Not applicable.  Pathologic Stage Classification (pTNM, AJCC 8th Edition): ypT2, ypN0   # Borderline DM; HTN   AUG 18th, 2024- PET scan- Whipple procedure with abnormal hypermetabolism centered at the pancreaticojejunostomy, worrisome for disease recurrence. Associated pancreaticojejunostomy stent in place; Hypermetabolic adjacent retroperitoneal lymph nodes, worrisome for metastatic disease. Ca 19-9 rising.    SEP 3rd, 2024- single gem [3 weeks on 1 week off]-because of neuropathy.  # OCT 29th, 2024-CT scan abdomen pelvis-progression.  # DEC 2024- start NALIRI.     Cancer of head of pancreas (HCC)  12/04/2019 Initial Diagnosis   Cancer of head of pancreas (HCC)   12/19/2019 - 12/19/2019 Chemotherapy   The patient had PACLitaxel -protein bound (ABRAXANE ) chemo infusion 250 mg, 125 mg/m2 = 250 mg, Intravenous,  Once, 1 of 4 cycles Administration: 250 mg (12/19/2019) gemcitabine  (GEMZAR ) 2,000 mg in sodium chloride  0.9 % 250 mL chemo infusion, 1,976 mg, Intravenous,  Once, 1 of 4 cycles Administration: 2,000 mg (12/19/2019)  for chemotherapy treatment.    01/02/2020 - 05/15/2020 Chemotherapy   Patient is on Treatment Plan : PANCREAS Modified FOLFIRINOX q14d x 4 cycles     11/30/2022 - 02/08/2023 Chemotherapy   Patient is on Treatment Plan : PANCREAS Gemcitabine  D1,8, (1000) q21d     03/14/2023 -  Chemotherapy   Patient is on Treatment Plan : PANCREAS Liposomal Irinotecan  + Leucovorin  + 5-FU IVCI q14d      HISTORY OF PRESENTING ILLNESS: Patient ambulating-independently.  Alone.   Robin Daniels 68 y.o.  female history recurrent stage IV  pancreatic adeno ca -currently on NALIRI chemotherapy is here for follow-up.  Patient chemo  #2 was held approximately 1 weeks ago because of neutropenia ANC 0.6.  Patient did not have any diarrhea.  Denies any fevers or chills.  No nausea no vomiting.  Abdominal pain/back pain improved.  She did not take any pain medication.  Patient  continues to have tingling and numbness in the extremities- currently on gabapentin  not significantly better or worse.   No fever no chills. Weight is stable.   Review of Systems  Constitutional:  Negative for chills, diaphoresis, fever and malaise/fatigue.  HENT:  Negative for nosebleeds and sore throat.   Eyes:  Negative for double vision.  Respiratory:  Negative for cough, hemoptysis, sputum production, shortness of breath and wheezing.   Cardiovascular:  Negative for chest pain, palpitations, orthopnea and leg swelling.  Gastrointestinal:  Negative for abdominal pain, blood in stool, constipation, diarrhea, heartburn, melena, nausea and vomiting.  Genitourinary:  Negative for dysuria, frequency and urgency.  Musculoskeletal:  Negative for back pain and joint pain.  Skin: Negative.  Negative for itching and rash.  Neurological:  Positive for tingling. Negative for dizziness, focal weakness, weakness and headaches.  Endo/Heme/Allergies:  Does not bruise/bleed easily.  Psychiatric/Behavioral:  Negative for depression. The patient is not nervous/anxious and does not have insomnia.      MEDICAL HISTORY:  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/04/2019   Complication of anesthesia    Fluttering heart    Hyperlipidemia    Hypertension    PONV (postoperative nausea and vomiting)    Pre-diabetes    Vertigo    none recently    SURGICAL HISTORY: Past Surgical History:  Procedure Laterality Date   CATARACT EXTRACTION W/PHACO Right 06/09/2021   Procedure: CATARACT EXTRACTION PHACO AND INTRAOCULAR LENS PLACEMENT (IOC) RIGHT malyugin;  Surgeon: Jaye Fallow, MD;  Location: [facility];  Service: Ophthalmology;  Laterality: Right;  12.40 1:07.0   COLONOSCOPY WITH PROPOFOL  N/A 01/13/2021   Procedure: COLONOSCOPY WITH PROPOFOL ;  Surgeon: Therisa Bi, MD;  Location: [facility] END[facility]COPY;   Service: Gastroenterology;  Laterality: N/A;   ENDOSCOPIC RETROGRADE CHOLANGIOPANCREATOGRAPHY (ERCP) WITH PROPOFOL  N/A 11/16/2019   Procedure: ENDOSCOPIC RETROGRADE CHOLANGIOPANCREATOGRAPHY (ERCP) WITH PROPOFOL ;  Surgeon: Jinny Carmine, MD;  Location: [facility] ENDOSCOPY;  Service: Endoscopy;  Laterality: N/A;   ERCP N/A 03/27/2020   Procedure: ENDOSCOPIC RETROGRADE CHOLANGIOPANCREATOGRAPHY (ERCP);  Surgeon: Jinny Carmine, MD;  Location: [facility] ENDOSCOPY;  Service: Endoscopy;  Laterality: N/A;   EUS N/A 11/29/2019   Procedure: FULL UPPER ENDOSCOPIC ULTRASOUND (EUS) RADIAL;  Surgeon: Elta Fonda SQUIBB, MD;  Location: [facility] ENDOSCOPY;  Service: Gastroenterology;  Laterality: N/A;   IR FLUORO RM 30-60 MIN  07/30/2020   IR IMAGING GUIDED PORT INSERTION  12/14/2019   JEJUNOSTOMY Left 07/15/2020   Procedure: DEXTER;  Surgeon: Aron Shoulders, MD;  Location: [facility] OR;  Service: General;  Laterality: Left;   LAPAROSCOPY N/A 07/15/2020   Procedure: DIAGNOSTIC LAPAROSCOPY;  Surgeon: Aron Shoulders, MD;  Location: [facility] OR;  Service: General;  Laterality: N/A;   right knee replacement Right [id number]   SHOULDER ARTHROSCOPY W/ ROTATOR CUFF REPAIR Right    WHIPPLE PROCEDURE N/A 07/15/2020   Procedure: WHIPPLE PROCEDURE;  Surgeon: Aron Shoulders, MD;  Location: [facility] OR;  Service: General;  Laterality: N/A;    SOCIAL HISTORY: Social History   Socioeconomic History   Marital status: Divorced    Spouse name: Not on file   Number of children: Not on file   Years of education: Not on file   Highest education level: Not on file  Occupational History   Not on file  Tobacco Use  Smoking status: Never   Smokeless tobacco: Never  Vaping Use   Vaping status: Never Used  Substance and Sexual Activity   Alcohol use: No    Alcohol/week: 0.0 standard drinks of alcohol   Drug use: No   Sexual activity: Never  Other Topics Concern   Not on file  Social History Narrative   ** Merged History Encounter **       Lives in [place];  with mom and son; worked in dietary; never smoked; no alcohol.    Social Drivers of Corporate Investment Banker Strain: Low Risk  (12/01/2022)   Overall Financial Resource Strain (CARDIA)    Difficulty of Paying Living Expenses: Not hard at all  Food Insecurity: No Food Insecurity (12/01/2022)   Hunger Vital Sign    Worried About Running Out of Food in the Last Year: Never true    Ran Out of Food in the Last Year: Never true  Transportation Needs: No Transportation Needs (12/01/2022)   PRAPARE - Administrator, Civil Service (Medical): No    Lack of Transportation (Non-Medical): No  Physical Activity: Not on file  Stress: Not on file  Social Connections: Not on file  Intimate Partner Violence: Not At Risk (12/01/2022)   Humiliation, Afraid, Rape, and Kick questionnaire    Fear of Current or Ex-Partner: No    Emotionally Abused: No    Physically Abused: No    Sexually Abused: No    FAMILY HISTORY: Family History  Problem Relation Age of Onset   Diabetes Mother    Hyperlipidemia Mother    Hypertension Mother    Vision loss Mother    Arthritis Father    Hyperlipidemia Father    Hypertension Father    Breast cancer Maternal Aunt     ALLERGIES:  has no known allergies.  MEDICATIONS:  Current Outpatient Medications  Medication Sig Dispense Refill   acetaminophen  (TYLENOL ) 325 MG tablet Take 650 mg by mouth every 6 (six) hours as needed for moderate pain.     amLODipine  (NORVASC ) 10 MG tablet Take 1 tablet (10 mg total) by mouth daily. 30 tablet 0   Calcium  Carbonate-Vit D-Min (CALCIUM  600+D PLUS MINERALS PO) Take 1 tablet by mouth in the morning and at bedtime.     DULoxetine  (CYMBALTA ) 30 MG capsule Take 1 capsule (30 mg total) by mouth daily. 30 capsule 3   Ferrous Bisglycinate  Chelate 28 MG CAPS Take 1 tablet by mouth daily.     gabapentin  (NEURONTIN ) 300 MG capsule Take 300 mg by mouth daily.     lidocaine -prilocaine  (EMLA ) cream Apply on the port. 30 -45 min  prior  to port access. 30 g 3   lisinopril  (ZESTRIL ) 10 MG tablet Take 10 mg by mouth daily.     metoprolol  succinate (TOPROL -XL) 50 MG 24 hr tablet Take 1 tablet (50 mg total) by mouth daily. Take with or immediately following a meal. 30 tablet 0   ondansetron  (ZOFRAN ) 8 MG tablet One pill every 8 hours as needed for nausea/vomitting. 40 tablet 1   potassium chloride  SA (KLOR-CON  M) 20 MEQ tablet Take 1 tablet (20 mEq total) by mouth 3 (three) times daily.     prochlorperazine  (COMPAZINE ) 10 MG tablet Take 1 tablet (10 mg total) by mouth every 6 (six) hours as needed for nausea or vomiting. 40 tablet 1   traMADol  (ULTRAM ) 50 MG tablet Take 1 tablet (50 mg total) by mouth every 8 (eight) hours as needed. 90 tablet  0   vitamin B-12 (CYANOCOBALAMIN ) 1000 MCG tablet Take 1 tablet (1,000 mcg total) by mouth daily.     diphenoxylate -atropine  (LOMOTIL ) 2.5-0.025 MG tablet Take 1 tablet by mouth 4 (four) times daily as needed for diarrhea or loose stools. Take it along with immodium (Patient not taking: Reported on 04/04/2023) 60 tablet 0   No current facility-administered medications for this visit.   Facility-Administered Medications Ordered in Other Visits  Medication Dose Route Frequency Provider Last Rate Last Admin   0.9 %  sodium chloride  infusion   Intravenous Continuous Akyah Lagrange R, MD 10 mL/hr at 04/04/23 0928 New Bag at 04/04/23 9071   fluorouracil  (ADRUCIL ) 4,500 mg in sodium chloride  0.9 % 60 mL chemo infusion  2,350 mg/m2 (Treatment Plan Recorded) Intravenous 1 day or 1 dose Tyra Michelle R, MD       heparin  lock flush 100 unit/mL  500 Units Intracatheter Once PRN Shayla Heming R, MD       leucovorin  764 mg in sodium chloride  0.9 % 250 mL infusion  400 mg/m2 (Treatment Plan Recorded) Intravenous Once Whittney Steenson R, MD       sodium chloride  flush (NS) 0.9 % injection 10 mL  10 mL Intravenous PRN Khadijatou Borak R, MD   10 mL at 06/23/21 1012   sodium chloride  flush  (NS) 0.9 % injection 10 mL  10 mL Intracatheter PRN Dushawn Pusey R, MD   10 mL at 03/16/23 1311   sodium chloride  flush (NS) 0.9 % injection 10 mL  10 mL Intracatheter PRN Rennie Cindy SAUNDERS, MD          PHYSICAL EXAMINATION: ECOG PERFORMANCE STATUS: 0 - Asymptomatic  Vitals:   04/04/23 0843  BP: (!) 145/80  Pulse: 91  Resp: 17  Temp: (!) 97.4 F (36.3 C)  SpO2: 100%       Filed Weights   04/04/23 0843  Weight: 167 lb (75.8 kg)      Physical Exam Vitals reviewed.  Constitutional:      Appearance: She is not ill-appearing.  HENT:     Head: Normocephalic and atraumatic.     Mouth/Throat:     Pharynx: No oropharyngeal exudate.  Eyes:     General: No scleral icterus. Cardiovascular:     Rate and Rhythm: Normal rate and regular rhythm.  Pulmonary:     Effort: Pulmonary effort is normal. No respiratory distress.     Breath sounds: No wheezing.  Abdominal:     General: There is no distension.     Palpations: Abdomen is soft.     Tenderness: There is no abdominal tenderness. There is no guarding.  Musculoskeletal:        General: No tenderness or deformity.  Lymphadenopathy:     Cervical: No cervical adenopathy.  Skin:    General: Skin is warm.     Coloration: Skin is not pale.  Neurological:     Mental Status: She is alert and oriented to person, place, and time.  Psychiatric:        Mood and Affect: Mood and affect normal.        Behavior: Behavior normal.    LABORATORY DATA:  I have reviewed the data as listed Lab Results  Component Value Date   WBC 4.1 04/04/2023   HGB 9.0 (L) 04/04/2023   HCT 28.0 (L) 04/04/2023   MCV 80.5 04/04/2023   PLT 272 04/04/2023   Recent Labs    03/14/23 0838 03/29/23 1008 04/04/23 0828  NA  137 136 137  K 3.5 2.8* 3.2*  CL 102 103 104  CO2 24 22 25   GLUCOSE 127* 176* 120*  BUN 8 7* 9  CREATININE 0.73 0.93 0.75  CALCIUM  9.2 8.7* 8.5*  GFRNONAA >60 >60 >60  PROT 7.8 7.0 6.6  ALBUMIN  3.8 3.2* 3.1*   AST 16 20 13*  ALT 12 12 10   ALKPHOS 65 66 81  BILITOT 0.7 0.3 0.5   Component Ref Range & Units 1 mo ago (06/25/22) 4 mo ago (03/26/22) 6 mo ago (01/22/22) 8 mo ago (11/23/21) 1 yr ago (06/23/21) 1 yr ago (04/20/21) 1 yr ago (01/23/21)  CA 19-9 0 - 35 U/mL 24 21 CM 17 CM 12 CM 14 CM 13 CM 16 CM     RADIOGRAPHIC STUDIES: I have personally reviewed the radiological images as listed and agreed with the findings in the report. No results found.   ASSESSMENT & PLAN:   Cancer of head of pancreas (HCC) # AUG 2024- STAGE IV/METASTATIC- Recurrent [No biopsy] Pancreas adenocarcinoma-   NGS-tempus-No targets noted however MSI-QNS.    OCT 29th, 2024- CT CAP/ PET scan: progression.   Intense metabolic activity at the junction of the pancreas and duodenum not changed from prior. New hypermetabolic tissue position between the pancreas in the SMA is concerning for nodal metastasis;  New smaller focus hypermotility just RIGHT of the takeoff of the SMA is also concerning for nodal metastasis; . overall concern for progression of local peripancreatic nodal metastasis.  Patient currently on NALIRI chemotherapy.  # Proceed with NALIRI cycle #2 today. Labs-CBC/chemistries were reviewed with the patient.  Will add growth factor today to because of neutropenia with cycle #1.  Will plan imaging after 3- 4 cycles.  Will monitor tumor marker closely.   # back pain-  secondary to progressive disease:improved- currently on tramadol  prn.  stable.    # Anemia- microcytic- Stable 9-10; NO IDA- ;  colo [EUS- 2021; oct 2022- colo; Dr.Anna]- stable.    # Severe hypokalemia potassium 2.8-.  Potassium  3.2- Continue K-Dur 20 mEq twice daily.  stable.     # PN--1-2- gapapetin increased to 600 mg-not well controlled;  recommend to start cymbalta -  stable.    # Hypertension-poorly controlled 180s]; Continue Norvasc /Metoprlol.[ per pt At home- 130/70s ;AGAIN RE-ITERATED to bring log.  stable.    # mediport:  continue  port flushes q 2-55M. stable.    # Anxiety/depression/insomnia: s/p Josh Borders- re: cymblata  # DISPOSITION:  # chemo today;2 days later- pump off/ injection-  # Follow up in 2 week- MD; labs-port-cbc/cmp; ca 19-9; 5FU pump+ nab-irinotecan  - pump off in after 2 days; udenyca  injection-  # follow up in 4  week-MD:  labs-port-cbc/cmp; ca 19-9; 5FU pump+ nab-irinotecanpump off in after 2 days; udenyca  injection---Robin    Cindy JONELLE Joe, MD 04/04/2023

## 2023-04-04 NOTE — Assessment & Plan Note (Addendum)
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    OCT 29th, 2024- CT CAP/ PET scan: progression.   Intense metabolic activity at the junction of the pancreas and duodenum not changed from prior. New hypermetabolic tissue position between the pancreas in the SMA is concerning for nodal metastasis;  New smaller focus hypermotility just RIGHT of the takeoff of the SMA is also concerning for nodal metastasis; . overall concern for progression of local peripancreatic nodal metastasis.  Patient currently on NALIRI chemotherapy.  # Proceed with NALIRI cycle #2 today. Labs-CBC/chemistries were reviewed with the patient.  Will add growth factor today to because of neutropenia with cycle #1.  Will plan imaging after 3- 4 cycles.  Will monitor tumor marker closely.   # back pain-  secondary to progressive disease:improved- currently on tramadol  prn.  stable.    # Anemia- microcytic- Stable 9-10; NO IDA- ;  colo [EUS- 2021; oct 2022- colo; Dr.Anna]- stable.    # Severe hypokalemia potassium 2.8-.  Potassium  3.2- Continue K-Dur 20 mEq twice daily.  stable.     # PN--1-2- gapapetin increased to 600 mg-not well controlled;  recommend to start cymbalta -  stable.    # Hypertension-poorly controlled 180s]; Continue Norvasc /Metoprlol.[ per pt At home- 130/70s ;AGAIN RE-ITERATED to bring log.  stable.    # mediport:  continue port flushes q 2-54M. stable.    # Anxiety/depression/insomnia: s/p Josh Borders- re: cymblata  # DISPOSITION:  # chemo today;2 days later- pump off/ injection-  # Follow up in 2 week- MD; labs-port-cbc/cmp; ca 19-9; 5FU pump+ nab-irinotecan  - pump off in after 2 days; udenyca  injection-  # follow up in 4  week-MD:  labs-port-cbc/cmp; ca 19-9; 5FU pump+ nab-irinotecanpump off in after 2 days; udenyca  injection---Dr.B

## 2023-04-04 NOTE — Patient Instructions (Signed)
 [facility] - A DEPT OF [facility]  Discharge Instructions: Thank you for choosing [facility] to provide your oncology and hematology care.  If you have a lab appointment with the Cancer Center, please go directly to the Cancer Center and check in at the registration area.  Wear comfortable clothing and clothing appropriate for easy access to any Portacath or PICC line.   We strive to give you quality time with your provider. You may need to reschedule your appointment if you arrive late (15 or more minutes).  Arriving late affects you and other patients whose appointments are after yours.  Also, if you miss three or more appointments without notifying the office, you may be dismissed from the clinic at the provider's discretion.      For prescription refill requests, have your pharmacy contact our office and allow 72 hours for refills to be completed.    Today you received the following chemotherapy and/or immunotherapy agents : fluorouracil / Irinotecan / leucovorin    To help prevent nausea and vomiting after your treatment, we encourage you to take your nausea medication as directed.  BELOW ARE SYMPTOMS THAT SHOULD BE REPORTED IMMEDIATELY: *FEVER GREATER THAN 100.4 F (38 C) OR HIGHER *CHILLS OR SWEATING *NAUSEA AND VOMITING THAT IS NOT CONTROLLED WITH YOUR NAUSEA MEDICATION *UNUSUAL SHORTNESS OF BREATH *UNUSUAL BRUISING OR BLEEDING *URINARY PROBLEMS (pain or burning when urinating, or frequent urination) *BOWEL PROBLEMS (unusual diarrhea, constipation, pain near the anus) TENDERNESS IN MOUTH AND THROAT WITH OR WITHOUT PRESENCE OF ULCERS (sore throat, sores in mouth, or a toothache) UNUSUAL RASH, SWELLING OR PAIN  UNUSUAL VAGINAL DISCHARGE OR ITCHING   Items with * indicate a potential emergency and should be followed up as soon as possible or go to the Emergency Department if any problems should occur.  Please show the CHEMOTHERAPY  ALERT CARD or IMMUNOTHERAPY ALERT CARD at check-in to the Emergency Department and triage nurse.  Should you have questions after your visit or need to cancel or reschedule your appointment, please contact [facility] - A DEPT OF [facility]  [phone] and follow the prompts.  Office hours are 8:00 a.m. to 4:30 p.m. Monday - Friday. Please note that voicemails left after 4:00 p.m. may not be returned until the following business day.  We are closed weekends and major holidays. You have access to a nurse at all times for urgent questions. Please call the main number to the clinic [phone] and follow the prompts.  For any non-urgent questions, you may also contact your provider using MyChart. We now offer e-Visits for anyone 37 and older to request care online for non-urgent symptoms. For details visit [email or web address].   Also download the MyChart app! Go to the app store, search MyChart, open the app, select [facility], and log in with your MyChart username and password.

## 2023-04-05 ENCOUNTER — Encounter: Payer: Self-pay | Admitting: Internal Medicine

## 2023-04-05 LAB — CANCER ANTIGEN 19-9: CA 19-9: 421 U/mL — ABNORMAL HIGH (ref 0–35)

## 2023-04-06 ENCOUNTER — Inpatient Hospital Stay: Payer: 59

## 2023-04-06 VITALS — BP 148/71 | HR 89 | Temp 98.1°F | Resp 16

## 2023-04-06 DIAGNOSIS — C250 Malignant neoplasm of head of pancreas: Secondary | ICD-10-CM

## 2023-04-06 DIAGNOSIS — Z5111 Encounter for antineoplastic chemotherapy: Secondary | ICD-10-CM | POA: Diagnosis not present

## 2023-04-06 MED ORDER — HEPARIN SOD (PORK) LOCK FLUSH 100 UNIT/ML IV SOLN
500.0000 [IU] | Freq: Once | INTRAVENOUS | Status: AC | PRN
  Administered 2023-04-06: 500 [IU]
  Filled 2023-04-06: qty 5

## 2023-04-06 MED ORDER — PEGFILGRASTIM-CBQV 6 MG/0.6ML ~~LOC~~ SOSY
6.0000 mg | PREFILLED_SYRINGE | Freq: Once | SUBCUTANEOUS | Status: AC
  Administered 2023-04-06: 6 mg via SUBCUTANEOUS
  Filled 2023-04-06: qty 0.6

## 2023-04-06 MED ORDER — SODIUM CHLORIDE 0.9% FLUSH
10.0000 mL | INTRAVENOUS | Status: DC | PRN
  Administered 2023-04-06: 10 mL
  Filled 2023-04-06: qty 10

## 2023-04-06 NOTE — Patient Instructions (Signed)

## 2023-04-12 ENCOUNTER — Ambulatory Visit: Payer: 59

## 2023-04-12 ENCOUNTER — Ambulatory Visit: Payer: 59 | Admitting: Internal Medicine

## 2023-04-12 ENCOUNTER — Other Ambulatory Visit: Payer: 59

## 2023-04-13 ENCOUNTER — Encounter: Payer: Self-pay | Admitting: Internal Medicine

## 2023-04-18 ENCOUNTER — Other Ambulatory Visit: Payer: Self-pay | Admitting: *Deleted

## 2023-04-18 DIAGNOSIS — C250 Malignant neoplasm of head of pancreas: Secondary | ICD-10-CM

## 2023-04-19 ENCOUNTER — Encounter: Payer: Self-pay | Admitting: Internal Medicine

## 2023-04-19 ENCOUNTER — Inpatient Hospital Stay: Payer: 59

## 2023-04-19 ENCOUNTER — Inpatient Hospital Stay (HOSPITAL_BASED_OUTPATIENT_CLINIC_OR_DEPARTMENT_OTHER): Payer: 59 | Admitting: Internal Medicine

## 2023-04-19 VITALS — BP 149/86 | HR 83 | Temp 97.4°F | Ht 64.0 in | Wt 165.0 lb

## 2023-04-19 VITALS — BP 158/80 | HR 88 | Resp 17

## 2023-04-19 DIAGNOSIS — C250 Malignant neoplasm of head of pancreas: Secondary | ICD-10-CM

## 2023-04-19 DIAGNOSIS — Z5111 Encounter for antineoplastic chemotherapy: Secondary | ICD-10-CM | POA: Diagnosis not present

## 2023-04-19 LAB — CBC WITH DIFFERENTIAL (CANCER CENTER ONLY)
Abs Immature Granulocytes: 0.1 10*3/uL — ABNORMAL HIGH (ref 0.00–0.07)
Basophils Absolute: 0 10*3/uL (ref 0.0–0.1)
Basophils Relative: 0 %
Eosinophils Absolute: 0.1 10*3/uL (ref 0.0–0.5)
Eosinophils Relative: 2 %
HCT: 28 % — ABNORMAL LOW (ref 36.0–46.0)
Hemoglobin: 9 g/dL — ABNORMAL LOW (ref 12.0–15.0)
Immature Granulocytes: 2 %
Lymphocytes Relative: 26 %
Lymphs Abs: 1.2 10*3/uL (ref 0.7–4.0)
MCH: 25.8 pg — ABNORMAL LOW (ref 26.0–34.0)
MCHC: 32.1 g/dL (ref 30.0–36.0)
MCV: 80.2 fL (ref 80.0–100.0)
Monocytes Absolute: 0.5 10*3/uL (ref 0.1–1.0)
Monocytes Relative: 10 %
Neutro Abs: 2.7 10*3/uL (ref 1.7–7.7)
Neutrophils Relative %: 60 %
Platelet Count: 239 10*3/uL (ref 150–400)
RBC: 3.49 MIL/uL — ABNORMAL LOW (ref 3.87–5.11)
RDW: 17.7 % — ABNORMAL HIGH (ref 11.5–15.5)
WBC Count: 4.6 10*3/uL (ref 4.0–10.5)
nRBC: 0 % (ref 0.0–0.2)

## 2023-04-19 LAB — CMP (CANCER CENTER ONLY)
ALT: 13 U/L (ref 0–44)
AST: 19 U/L (ref 15–41)
Albumin: 3 g/dL — ABNORMAL LOW (ref 3.5–5.0)
Alkaline Phosphatase: 104 U/L (ref 38–126)
Anion gap: 10 (ref 5–15)
BUN: 7 mg/dL — ABNORMAL LOW (ref 8–23)
CO2: 22 mmol/L (ref 22–32)
Calcium: 8.3 mg/dL — ABNORMAL LOW (ref 8.9–10.3)
Chloride: 102 mmol/L (ref 98–111)
Creatinine: 0.9 mg/dL (ref 0.44–1.00)
GFR, Estimated: >60 mL/min (ref >60)
Glucose, Bld: 143 mg/dL — ABNORMAL HIGH (ref 70–99)
Potassium: 3.3 mmol/L — ABNORMAL LOW (ref 3.5–5.1)
Sodium: 134 mmol/L — ABNORMAL LOW (ref 135–145)
Total Bilirubin: 0.4 mg/dL (ref 0.0–1.2)
Total Protein: 6.6 g/dL (ref 6.5–8.1)

## 2023-04-19 MED ORDER — FLUOROURACIL CHEMO INJECTION 5 GM/100ML
2350.0000 mg/m2 | INTRAVENOUS | Status: DC
  Administered 2023-04-19: 4500 mg via INTRAVENOUS
  Filled 2023-04-19: qty 90

## 2023-04-19 MED ORDER — DEXAMETHASONE SODIUM PHOSPHATE 10 MG/ML IJ SOLN
10.0000 mg | Freq: Once | INTRAMUSCULAR | Status: AC
  Administered 2023-04-19: 10 mg via INTRAVENOUS
  Filled 2023-04-19: qty 1

## 2023-04-19 MED ORDER — SODIUM CHLORIDE 0.9 % IV SOLN
INTRAVENOUS | Status: DC
  Filled 2023-04-19: qty 250

## 2023-04-19 MED ORDER — PALONOSETRON HCL INJECTION 0.25 MG/5ML
0.2500 mg | Freq: Once | INTRAVENOUS | Status: AC
  Administered 2023-04-19: 0.25 mg via INTRAVENOUS
  Filled 2023-04-19: qty 5

## 2023-04-19 MED ORDER — LEUCOVORIN CALCIUM INJECTION 350 MG
400.0000 mg/m2 | Freq: Once | INTRAMUSCULAR | Status: AC
  Administered 2023-04-19: 764 mg via INTRAVENOUS
  Filled 2023-04-19: qty 38.2

## 2023-04-19 MED ORDER — IRINOTECAN HCL LIPOSOME CHEMO INJECTION 43 MG/10ML
68.0000 mg/m2 | INJECTION | Freq: Once | INTRAVENOUS | Status: AC
  Administered 2023-04-19: 129 mg via INTRAVENOUS
  Filled 2023-04-19: qty 30

## 2023-04-19 NOTE — Progress Notes (Signed)
Nutrition Follow-up:  Patient with recurrent stage IV pancreatic cancer.  Patient on fluorouracil, irinotecan liposomal.  Met with patient during infusion.  Reports decreased appetite.  "I just don't want anything to eat."  "Sometimes I can eat pretty good."  Drinking carnation instant breakfast (premade) shakes 0-2 a day.  Denies diarrhea    Medications: reviewed  Labs: K 3.3, glucose 143  Anthropometrics:   Weight 165 lb  180 lb 3.2 oz on 9/24 181 lb 5.3 oz on 12/07/22 8% weight loss in the last 4 months, concerning  NUTRITION DIAGNOSIS: Inadequate oral intake related to cancer and related treatment side effects as evidenced by    INTERVENTION:  Consider trial of appetite stimulant.  Message sent to MD Continue oral nutrition supplement for added calories at least daily. Encouraged small frequent "nibbles"      MONITORING, EVALUATION, GOAL: weight trends, intake   NEXT VISIT: Tuesday, Feb 18 during infusion  Dakiyah Heinke B. Freida Busman, RD, LDN Registered Dietitian [phone]

## 2023-04-19 NOTE — Patient Instructions (Signed)
 [facility] - A DEPT OF [facility]  Discharge Instructions: Thank you for choosing [facility] to provide your oncology and hematology care.  If you have a lab appointment with the Cancer Center, please go directly to the Cancer Center and check in at the registration area.  Wear comfortable clothing and clothing appropriate for easy access to any Portacath or PICC line.   We strive to give you quality time with your provider. You may need to reschedule your appointment if you arrive late (15 or more minutes).  Arriving late affects you and other patients whose appointments are after yours.  Also, if you miss three or more appointments without notifying the office, you may be dismissed from the clinic at the provider's discretion.      For prescription refill requests, have your pharmacy contact our office and allow 72 hours for refills to be completed.    Today you received the following chemotherapy and/or immunotherapy agents irinotecan, leucovorin, and adrucil      To help prevent nausea and vomiting after your treatment, we encourage you to take your nausea medication as directed.  BELOW ARE SYMPTOMS THAT SHOULD BE REPORTED IMMEDIATELY: *FEVER GREATER THAN 100.4 F (38 C) OR HIGHER *CHILLS OR SWEATING *NAUSEA AND VOMITING THAT IS NOT CONTROLLED WITH YOUR NAUSEA MEDICATION *UNUSUAL SHORTNESS OF BREATH *UNUSUAL BRUISING OR BLEEDING *URINARY PROBLEMS (pain or burning when urinating, or frequent urination) *BOWEL PROBLEMS (unusual diarrhea, constipation, pain near the anus) TENDERNESS IN MOUTH AND THROAT WITH OR WITHOUT PRESENCE OF ULCERS (sore throat, sores in mouth, or a toothache) UNUSUAL RASH, SWELLING OR PAIN  UNUSUAL VAGINAL DISCHARGE OR ITCHING   Items with * indicate a potential emergency and should be followed up as soon as possible or go to the Emergency Department if any problems should occur.  Please show the CHEMOTHERAPY  ALERT CARD or IMMUNOTHERAPY ALERT CARD at check-in to the Emergency Department and triage nurse.  Should you have questions after your visit or need to cancel or reschedule your appointment, please contact [facility] - A DEPT OF [facility]  [phone] and follow the prompts.  Office hours are 8:00 a.m. to 4:30 p.m. Monday - Friday. Please note that voicemails left after 4:00 p.m. may not be returned until the following business day.  We are closed weekends and major holidays. You have access to a nurse at all times for urgent questions. Please call the main number to the clinic [phone] and follow the prompts.  For any non-urgent questions, you may also contact your provider using MyChart. We now offer e-Visits for anyone 32 and older to request care online for non-urgent symptoms. For details visit [email or web address].   Also download the MyChart app! Go to the app store, search "MyChart", open the app, select [place], and log in with your MyChart username and password.

## 2023-04-19 NOTE — Assessment & Plan Note (Addendum)
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    OCT 29th, 2024- CT CAP/ PET scan: progression.   Intense metabolic activity at the junction of the pancreas and duodenum not changed from prior. New hypermetabolic tissue position between the pancreas in the SMA is concerning for nodal metastasis;  New smaller focus hypermotility just RIGHT of the takeoff of the SMA is also concerning for nodal metastasis; . overall concern for progression of local peripancreatic nodal metastasis.  Patient currently on NALIRI chemotherapy.  # Proceed with NALIRI cycle #3 today. Labs-CBC/chemistries were reviewed with the patient- with growth factor Will plan imaging after 4-6 cycles.  No significant improvement in the tumor marker at this time.  Will monitor tumor marker closely.   # back pain-  secondary to progressive disease:improved- currently on tramadol prn.  stable.    # Anemia- microcytic- Stable 9-10; NO IDA- ;  colo [EUS- 2021; oct 2022- colo; Dr.Anna]- stable.  # Severe hypokalemia potassium 2.8-.  Potassium  3.2- Continue K-Dur 20 mEq twice daily.  stable.   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  # Hypertension-poorly controlled 180s]; Continue Norvasc/Metoprlol.[ per pt At home- 130/70s ;AGAIN RE-ITERATED to bring log.  stable.  # mediport:  continue port flushes q 2-23M.stable.  # Anxiety/depression/insomnia: s/p Josh Borders- re: cymblata- stable.  # DISPOSITION:  # chemo today;2 days later- pump off/ injection-  # Follow up in 2 week- MD; labs-port-cbc/cmp; ca 19-9; 5FU pump+ nab-irinotecan - pump off in after 2 days; udenyca injection-  # follow up in 4  week-MD:  labs-port-cbc/cmp; ca 19-9; 5FU pump+ nab-irinotecanpump off in after 2 days; udenyca injection---Dr.B

## 2023-04-19 NOTE — Progress Notes (Signed)
Appetite 25% normal, carnation instant breakfast/boost 1-2 per day.

## 2023-04-19 NOTE — Progress Notes (Signed)
[facility] CONSULT NOTE  Patient Care Team: Entzminger, Danton Clap, MD as PCP - General (Internal Medicine) Toy Cookey, FNP (Family Medicine) Jim Like, RN as Registered Nurse Scarlett Presto, RN (Inactive) as Registered Nurse Benita Gutter, RN as Oncology Nurse Navigator Almond Lint, MD as Consulting Physician (General Surgery) Earna Coder, MD as Consulting Physician (Internal Medicine)  CHIEF COMPLAINTS/PURPOSE OF CONSULTATION: pancreas adenocarcinoma  Oncology History Overview Note  #Pancreas adenocarcinoma-Stage IB- [id number]-[EUS- Dr.Spaete; [facility]/GI; mass 22x30mm; invading/abutting superior mesenteric vein; 2 enlarged lymph nodes peripancreatic/porta hepatis largest 10 x 5.8 mm-nonpathologic based on EUS criteria/no biopsy]-borderline resectable.  PET scan no evidence of distant metastatic disease.  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 Fredirick Maudlin September]  # 01/02/2020-FOLFIRINOX; Neulasta. S/p FOLFIRINOX 10 cycles- April 19th whipples-  s/p neoadjuvant FOLFIRINX. #10 cycles]-s/p Whipple's [on April 19th 2022]- ypT2 [3.5cm]; pN0/11;   DIS-CONTINUED  FOLFIRINOX cycle #11 & 12-  Sec to PN-2-3.  PANCREAS (EXOCRINE), CARCINOMA: Resection  Procedure: Whipple procedure  Tumor Site: Head of pancreas.  Tumor Size: 3.5 cm, slide measurement.  Histologic Type: Pancreatic ductal adenocarcinoma.  Histologic Grade: Moderately differentiated.  Tumor Extension: Into peripancreatic connective tissue.  Treatment Effect: Moderate treatment effect.  Lymphovascular Invasion: Not identified.  Perineural Invasion: Present.  Margins: All surgical margins are negative for carcinoma.  Regional Lymph Nodes:       Number of Lymph Nodes with Tumor: 0       Number of Lymph Nodes Examined: 15  Distant Metastasis:       Distant  Site(s) Involved: Not applicable.  Pathologic Stage Classification (pTNM, AJCC 8th Edition): ypT2, ypN0   # Borderline DM; HTN   AUG 18th, 2024- PET scan- Whipple procedure with abnormal hypermetabolism centered at the pancreaticojejunostomy, worrisome for disease recurrence. Associated pancreaticojejunostomy stent in place; Hypermetabolic adjacent retroperitoneal lymph nodes, worrisome for metastatic disease. Ca 19-9 rising.    SEP 3rd, 2024- single gem [3 weeks on 1 week off]-because of neuropathy.  # OCT 29th, 2024-CT scan abdomen pelvis-progression.  # DEC 2024- start NALIRI.     Cancer of head of pancreas (HCC)  12/04/2019 Initial Diagnosis   Cancer of head of pancreas (HCC)   12/19/2019 - 12/19/2019 Chemotherapy   The patient had PACLitaxel-protein bound (ABRAXANE) chemo infusion 250 mg, 125 mg/m2 = 250 mg, Intravenous,  Once, 1 of 4 cycles Administration: 250 mg (12/19/2019) gemcitabine (GEMZAR) 2,000 mg in sodium chloride 0.9 % 250 mL chemo infusion, 1,976 mg, Intravenous,  Once, 1 of 4 cycles Administration: 2,000 mg (12/19/2019)  for chemotherapy treatment.    01/02/2020 - 05/15/2020 Chemotherapy   Patient is on Treatment Plan : PANCREAS Modified FOLFIRINOX q14d x 4 cycles     11/30/2022 - 02/08/2023 Chemotherapy   Patient is on Treatment Plan : PANCREAS Gemcitabine D1,8, (1000) q21d     03/14/2023 -  Chemotherapy   Patient is on Treatment Plan : PANCREAS Liposomal Irinotecan + Leucovorin + 5-FU IVCI q14d      HISTORY OF PRESENTING ILLNESS: Patient ambulating-independently.  Alone.   Robin Daniels 68 y.o.  female history recurrent stage IV  pancreatic adeno ca -currently on NALIRI chemotherapy is here for follow-up.   Patient did not have any diarrhea.  Denies any fevers or chills.  No nausea no vomiting.  Abdominal pain/back pain improved.  She did not take any pain medication.  Patient continues  to have tingling and numbness in the extremities- currently on gabapentin not  significantly better or worse.   No fever no chills. Weight is stable.   Review of Systems  Constitutional:  Negative for chills, diaphoresis, fever and malaise/fatigue.  HENT:  Negative for nosebleeds and sore throat.   Eyes:  Negative for double vision.  Respiratory:  Negative for cough, hemoptysis, sputum production, shortness of breath and wheezing.   Cardiovascular:  Negative for chest pain, palpitations, orthopnea and leg swelling.  Gastrointestinal:  Negative for abdominal pain, blood in stool, constipation, diarrhea, heartburn, melena, nausea and vomiting.  Genitourinary:  Negative for dysuria, frequency and urgency.  Musculoskeletal:  Negative for back pain and joint pain.  Skin: Negative.  Negative for itching and rash.  Neurological:  Positive for tingling. Negative for dizziness, focal weakness, weakness and headaches.  Endo/Heme/Allergies:  Does not bruise/bleed easily.  Psychiatric/Behavioral:  Negative for depression. The patient is not nervous/anxious and does not have insomnia.      MEDICAL HISTORY:  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/04/2019   Complication of anesthesia    Fluttering heart    Hyperlipidemia    Hypertension    PONV (postoperative nausea and vomiting)    Pre-diabetes    Vertigo    none recently    SURGICAL HISTORY: Past Surgical History:  Procedure Laterality Date   CATARACT EXTRACTION W/PHACO Right 06/09/2021   Procedure: CATARACT EXTRACTION PHACO AND INTRAOCULAR LENS PLACEMENT (IOC) RIGHT malyugin;  Surgeon: Galen Manila, MD;  Location: [facility];  Service: Ophthalmology;  Laterality: Right;  12.40 1:07.0   COLONOSCOPY WITH PROPOFOL N/A 01/13/2021   Procedure: COLONOSCOPY WITH PROPOFOL;  Surgeon: Wyline Mood, MD;  Location: [facility] END[facility]COPY;  Service: Gastroenterology;  Laterality: N/A;   ENDOSCOPIC RETROGRADE  CHOLANGIOPANCREATOGRAPHY (ERCP) WITH PROPOFOL N/A 11/16/2019   Procedure: ENDOSCOPIC RETROGRADE CHOLANGIOPANCREATOGRAPHY (ERCP) WITH PROPOFOL;  Surgeon: Midge Minium, MD;  Location: [facility] ENDOSCOPY;  Service: Endoscopy;  Laterality: N/A;   ERCP N/A 03/27/2020   Procedure: ENDOSCOPIC RETROGRADE CHOLANGIOPANCREATOGRAPHY (ERCP);  Surgeon: Midge Minium, MD;  Location: [facility] ENDOSCOPY;  Service: Endoscopy;  Laterality: N/A;   EUS N/A 11/29/2019   Procedure: FULL UPPER ENDOSCOPIC ULTRASOUND (EUS) RADIAL;  Surgeon: Doren Custard, MD;  Location: [facility] ENDOSCOPY;  Service: Gastroenterology;  Laterality: N/A;   IR FLUORO RM 30-60 MIN  07/30/2020   IR IMAGING GUIDED PORT INSERTION  12/14/2019   JEJUNOSTOMY Left 07/15/2020   Procedure: Rance Muir;  Surgeon: Almond Lint, MD;  Location: [facility] OR;  Service: General;  Laterality: Left;   LAPAROSCOPY N/A 07/15/2020   Procedure: DIAGNOSTIC LAPAROSCOPY;  Surgeon: Almond Lint, MD;  Location: [facility] OR;  Service: General;  Laterality: N/A;   right knee replacement Right [id number]   SHOULDER ARTHROSCOPY W/ ROTATOR CUFF REPAIR Right    WHIPPLE PROCEDURE N/A 07/15/2020   Procedure: WHIPPLE PROCEDURE;  Surgeon: Almond Lint, MD;  Location: [facility] OR;  Service: General;  Laterality: N/A;    SOCIAL HISTORY: Social History   Socioeconomic History   Marital status: Divorced    Spouse name: Not on file   Number of children: Not on file   Years of education: Not on file   Highest education level: Not on file  Occupational History   Not on file  Tobacco Use   Smoking status: Never   Smokeless tobacco: Never  Vaping Use  Vaping status: Never Used  Substance and Sexual Activity   Alcohol use: No    Alcohol/week: 0.0 standard drinks of alcohol   Drug use: No   Sexual activity: Never  Other Topics Concern   Not on file  Social History Narrative   ** Merged History Encounter **       Lives in [place]; with mom and son; worked in dietary; never smoked; no alcohol.     Social Drivers of Corporate investment banker Strain: Low Risk  (12/01/2022)   Overall Financial Resource Strain (CARDIA)    Difficulty of Paying Living Expenses: Not hard at all  Food Insecurity: No Food Insecurity (12/01/2022)   Hunger Vital Sign    Worried About Running Out of Food in the Last Year: Never true    Ran Out of Food in the Last Year: Never true  Transportation Needs: No Transportation Needs (12/01/2022)   PRAPARE - Administrator, Civil Service (Medical): No    Lack of Transportation (Non-Medical): No  Physical Activity: Not on file  Stress: Not on file  Social Connections: Not on file  Intimate Partner Violence: Not At Risk (12/01/2022)   Humiliation, Afraid, Rape, and Kick questionnaire    Fear of Current or Ex-Partner: No    Emotionally Abused: No    Physically Abused: No    Sexually Abused: No    FAMILY HISTORY: Family History  Problem Relation Age of Onset   Diabetes Mother    Hyperlipidemia Mother    Hypertension Mother    Vision loss Mother    Arthritis Father    Hyperlipidemia Father    Hypertension Father    Breast cancer Maternal Aunt     ALLERGIES:  has no known allergies.  MEDICATIONS:  Current Outpatient Medications  Medication Sig Dispense Refill   acetaminophen (TYLENOL) 325 MG tablet Take 650 mg by mouth every 6 (six) hours as needed for moderate pain.     amLODipine (NORVASC) 10 MG tablet Take 1 tablet (10 mg total) by mouth daily. 30 tablet 0   Calcium Carbonate-Vit D-Min (CALCIUM 600+D PLUS MINERALS PO) Take 1 tablet by mouth in the morning and at bedtime.     diphenoxylate-atropine (LOMOTIL) 2.5-0.025 MG tablet Take 1 tablet by mouth 4 (four) times daily as needed for diarrhea or loose stools. Take it along with immodium 60 tablet 0   DULoxetine (CYMBALTA) 30 MG capsule Take 1 capsule (30 mg total) by mouth daily. 30 capsule 3   Ferrous Bisglycinate Chelate 28 MG CAPS Take 1 tablet by mouth daily.     gabapentin (NEURONTIN)  300 MG capsule Take 300 mg by mouth daily.     lidocaine-prilocaine (EMLA) cream Apply on the port. 30 -45 min  prior to port access. 30 g 3   lisinopril (ZESTRIL) 10 MG tablet Take 10 mg by mouth daily.     metoprolol succinate (TOPROL-XL) 50 MG 24 hr tablet Take 1 tablet (50 mg total) by mouth daily. Take with or immediately following a meal. 30 tablet 0   ondansetron (ZOFRAN) 8 MG tablet One pill every 8 hours as needed for nausea/vomitting. 40 tablet 1   potassium chloride SA (KLOR-CON M) 20 MEQ tablet Take 1 tablet (20 mEq total) by mouth 3 (three) times daily.     prochlorperazine (COMPAZINE) 10 MG tablet Take 1 tablet (10 mg total) by mouth every 6 (six) hours as needed for nausea or vomiting. 40 tablet 1   traMADol (ULTRAM) 50  MG tablet Take 1 tablet (50 mg total) by mouth every 8 (eight) hours as needed. 90 tablet 0   vitamin B-12 (CYANOCOBALAMIN) 1000 MCG tablet Take 1 tablet (1,000 mcg total) by mouth daily.     No current facility-administered medications for this visit.   Facility-Administered Medications Ordered in Other Visits  Medication Dose Route Frequency Provider Last Rate Last Admin   sodium chloride flush (NS) 0.9 % injection 10 mL  10 mL Intravenous PRN Louretta Shorten R, MD   10 mL at 06/23/21 1012   sodium chloride flush (NS) 0.9 % injection 10 mL  10 mL Intracatheter PRN Earna Coder, MD   10 mL at 03/16/23 1311      PHYSICAL EXAMINATION: ECOG PERFORMANCE STATUS: 0 - Asymptomatic  Vitals:   04/19/23 0859  BP: (!) 149/86  Pulse: 83  Temp: (!) 97.4 F (36.3 C)  SpO2: 100%        Filed Weights   04/19/23 0859  Weight: 165 lb (74.8 kg)       Physical Exam Vitals reviewed.  Constitutional:      Appearance: She is not ill-appearing.  HENT:     Head: Normocephalic and atraumatic.     Mouth/Throat:     Pharynx: No oropharyngeal exudate.  Eyes:     General: No scleral icterus. Cardiovascular:     Rate and Rhythm: Normal rate and  regular rhythm.  Pulmonary:     Effort: Pulmonary effort is normal. No respiratory distress.     Breath sounds: No wheezing.  Abdominal:     General: There is no distension.     Palpations: Abdomen is soft.     Tenderness: There is no abdominal tenderness. There is no guarding.  Musculoskeletal:        General: No tenderness or deformity.  Lymphadenopathy:     Cervical: No cervical adenopathy.  Skin:    General: Skin is warm.     Coloration: Skin is not pale.  Neurological:     Mental Status: She is alert and oriented to person, place, and time.  Psychiatric:        Mood and Affect: Mood and affect normal.        Behavior: Behavior normal.    LABORATORY DATA:  I have reviewed the data as listed Lab Results  Component Value Date   WBC 4.6 04/19/2023   HGB 9.0 (L) 04/19/2023   HCT 28.0 (L) 04/19/2023   MCV 80.2 04/19/2023   PLT 239 04/19/2023   Recent Labs    03/14/23 0838 03/29/23 1008 04/04/23 0828  NA 137 136 137  K 3.5 2.8* 3.2*  CL 102 103 104  CO2 24 22 25   GLUCOSE 127* 176* 120*  BUN 8 7* 9  CREATININE 0.73 0.93 0.75  CALCIUM 9.2 8.7* 8.5*  GFRNONAA >60 >60 >60  PROT 7.8 7.0 6.6  ALBUMIN 3.8 3.2* 3.1*  AST 16 20 13*  ALT 12 12 10   ALKPHOS 65 66 81  BILITOT 0.7 0.3 0.5   Component Ref Range & Units 1 mo ago (06/25/22) 4 mo ago (03/26/22) 6 mo ago (01/22/22) 8 mo ago (11/23/21) 1 yr ago (06/23/21) 1 yr ago (04/20/21) 1 yr ago (01/23/21)  CA 19-9 0 - 35 U/mL 24 21 CM 17 CM 12 CM 14 CM 13 CM 16 CM     RADIOGRAPHIC STUDIES: I have personally reviewed the radiological images as listed and agreed with the findings in the report. No results found.  ASSESSMENT & PLAN:   Cancer of head of pancreas (HCC) # AUG 2024- STAGE IV/METASTATIC- Recurrent [No biopsy] Pancreas adenocarcinoma-   NGS-tempus-No targets noted however MSI-QNS.    OCT 29th, 2024- CT CAP/ PET scan: progression.   Intense metabolic activity at the junction of the pancreas and duodenum  not changed from prior. New hypermetabolic tissue position between the pancreas in the SMA is concerning for nodal metastasis;  New smaller focus hypermotility just RIGHT of the takeoff of the SMA is also concerning for nodal metastasis; . overall concern for progression of local peripancreatic nodal metastasis.  Patient currently on NALIRI chemotherapy.  # Proceed with NALIRI cycle #3 today. Labs-CBC/chemistries were reviewed with the patient- with growth factor Will plan imaging after 4-6 cycles.  No significant improvement in the tumor marker at this time.  Will monitor tumor marker closely.   # back pain-  secondary to progressive disease:improved- currently on tramadol prn.  stable.    # Anemia- microcytic- Stable 9-10; NO IDA- ;  colo [EUS- 2021; oct 2022- colo; Dr.Anna]- stable.  # Severe hypokalemia potassium 2.8-.  Potassium  3.2- Continue K-Dur 20 mEq twice daily.  stable.   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  # Hypertension-poorly controlled 180s]; Continue Norvasc/Metoprlol.[ per pt At home- 130/70s ;AGAIN RE-ITERATED to bring log.  stable.  # mediport:  continue port flushes q 2-17M.stable.  # Anxiety/depression/insomnia: s/p Josh Borders- re: cymblata- stable.  # DISPOSITION:  # chemo today;2 days later- pump off/ injection-  # Follow up in 2 week- MD; labs-port-cbc/cmp; ca 19-9; 5FU pump+ nab-irinotecan - pump off in after 2 days; udenyca injection-  # follow up in 4  week-MD:  labs-port-cbc/cmp; ca 19-9; 5FU pump+ nab-irinotecanpump off in after 2 days; udenyca injection---Dr.B    Earna Coder, MD 04/19/2023

## 2023-04-20 LAB — CANCER ANTIGEN 19-9: CA 19-9: 373 U/mL — ABNORMAL HIGH (ref 0–35)

## 2023-04-21 ENCOUNTER — Inpatient Hospital Stay: Payer: 59

## 2023-04-21 VITALS — BP 151/93 | HR 98 | Temp 98.9°F | Resp 19

## 2023-04-21 DIAGNOSIS — C250 Malignant neoplasm of head of pancreas: Secondary | ICD-10-CM

## 2023-04-21 DIAGNOSIS — Z5111 Encounter for antineoplastic chemotherapy: Secondary | ICD-10-CM | POA: Diagnosis not present

## 2023-04-21 MED ORDER — SODIUM CHLORIDE 0.9% FLUSH
10.0000 mL | INTRAVENOUS | Status: DC | PRN
Start: 2023-04-21 — End: 2023-04-21
  Administered 2023-04-21: 10 mL
  Filled 2023-04-21: qty 10

## 2023-04-21 MED ORDER — PEGFILGRASTIM-CBQV 6 MG/0.6ML ~~LOC~~ SOSY
6.0000 mg | PREFILLED_SYRINGE | Freq: Once | SUBCUTANEOUS | Status: AC
  Administered 2023-04-21: 6 mg via SUBCUTANEOUS

## 2023-04-21 MED ORDER — HEPARIN SOD (PORK) LOCK FLUSH 100 UNIT/ML IV SOLN
500.0000 [IU] | Freq: Once | INTRAVENOUS | Status: AC | PRN
  Administered 2023-04-21: 500 [IU]
  Filled 2023-04-21: qty 5

## 2023-04-30 ENCOUNTER — Encounter: Payer: Self-pay | Admitting: Internal Medicine

## 2023-05-03 ENCOUNTER — Other Ambulatory Visit: Payer: Self-pay | Admitting: Internal Medicine

## 2023-05-03 ENCOUNTER — Inpatient Hospital Stay: Payer: 59

## 2023-05-03 ENCOUNTER — Telehealth: Payer: Self-pay | Admitting: Internal Medicine

## 2023-05-03 ENCOUNTER — Inpatient Hospital Stay: Payer: 59 | Admitting: Internal Medicine

## 2023-05-03 ENCOUNTER — Encounter: Payer: Self-pay | Admitting: Internal Medicine

## 2023-05-03 DIAGNOSIS — C250 Malignant neoplasm of head of pancreas: Secondary | ICD-10-CM

## 2023-05-03 NOTE — Telephone Encounter (Signed)
Patient called and is not coming today. She states she is sick with vomiting and pooping. She said she doesn't think she needs to be seen today. Please let me know how to r/s.  Thank you

## 2023-05-03 NOTE — Progress Notes (Signed)
Pt sick- will switch to 2/12.

## 2023-05-05 ENCOUNTER — Inpatient Hospital Stay: Payer: 59

## 2023-05-11 ENCOUNTER — Encounter: Payer: Self-pay | Admitting: Internal Medicine

## 2023-05-11 ENCOUNTER — Inpatient Hospital Stay: Payer: 59 | Attending: Internal Medicine

## 2023-05-11 ENCOUNTER — Inpatient Hospital Stay: Payer: 59

## 2023-05-11 ENCOUNTER — Inpatient Hospital Stay (HOSPITAL_BASED_OUTPATIENT_CLINIC_OR_DEPARTMENT_OTHER): Payer: 59 | Admitting: Internal Medicine

## 2023-05-11 ENCOUNTER — Telehealth: Payer: Self-pay | Admitting: *Deleted

## 2023-05-11 ENCOUNTER — Other Ambulatory Visit: Payer: Self-pay

## 2023-05-11 VITALS — BP 148/79 | HR 108 | Temp 98.5°F | Ht 64.0 in | Wt 154.4 lb

## 2023-05-11 DIAGNOSIS — C250 Malignant neoplasm of head of pancreas: Secondary | ICD-10-CM

## 2023-05-11 DIAGNOSIS — Z452 Encounter for adjustment and management of vascular access device: Secondary | ICD-10-CM | POA: Insufficient documentation

## 2023-05-11 DIAGNOSIS — E876 Hypokalemia: Secondary | ICD-10-CM | POA: Diagnosis not present

## 2023-05-11 DIAGNOSIS — Z5111 Encounter for antineoplastic chemotherapy: Secondary | ICD-10-CM | POA: Diagnosis present

## 2023-05-11 DIAGNOSIS — G4700 Insomnia, unspecified: Secondary | ICD-10-CM | POA: Insufficient documentation

## 2023-05-11 DIAGNOSIS — K1230 Oral mucositis (ulcerative), unspecified: Secondary | ICD-10-CM | POA: Insufficient documentation

## 2023-05-11 DIAGNOSIS — F32A Depression, unspecified: Secondary | ICD-10-CM | POA: Diagnosis not present

## 2023-05-11 DIAGNOSIS — I10 Essential (primary) hypertension: Secondary | ICD-10-CM | POA: Insufficient documentation

## 2023-05-11 DIAGNOSIS — D509 Iron deficiency anemia, unspecified: Secondary | ICD-10-CM | POA: Insufficient documentation

## 2023-05-11 DIAGNOSIS — F419 Anxiety disorder, unspecified: Secondary | ICD-10-CM | POA: Diagnosis not present

## 2023-05-11 DIAGNOSIS — Z5189 Encounter for other specified aftercare: Secondary | ICD-10-CM | POA: Diagnosis not present

## 2023-05-11 DIAGNOSIS — M549 Dorsalgia, unspecified: Secondary | ICD-10-CM | POA: Diagnosis not present

## 2023-05-11 DIAGNOSIS — R197 Diarrhea, unspecified: Secondary | ICD-10-CM | POA: Insufficient documentation

## 2023-05-11 LAB — CMP (CANCER CENTER ONLY)
ALT: 18 U/L (ref 0–44)
AST: 25 U/L (ref 15–41)
Albumin: 3.1 g/dL — ABNORMAL LOW (ref 3.5–5.0)
Alkaline Phosphatase: 92 U/L (ref 38–126)
Anion gap: 10 (ref 5–15)
BUN: 14 mg/dL (ref 8–23)
CO2: 23 mmol/L (ref 22–32)
Calcium: 8.5 mg/dL — ABNORMAL LOW (ref 8.9–10.3)
Chloride: 100 mmol/L (ref 98–111)
Creatinine: 1.09 mg/dL — ABNORMAL HIGH (ref 0.44–1.00)
GFR, Estimated: 55 mL/min — ABNORMAL LOW (ref >60)
Glucose, Bld: 121 mg/dL — ABNORMAL HIGH (ref 70–99)
Potassium: 2.5 mmol/L — CL (ref 3.5–5.1)
Sodium: 133 mmol/L — ABNORMAL LOW (ref 135–145)
Total Bilirubin: 0.6 mg/dL (ref 0.0–1.2)
Total Protein: 6.9 g/dL (ref 6.5–8.1)

## 2023-05-11 LAB — CBC WITH DIFFERENTIAL (CANCER CENTER ONLY)
Abs Immature Granulocytes: 0.03 10*3/uL (ref 0.00–0.07)
Basophils Absolute: 0.1 10*3/uL (ref 0.0–0.1)
Basophils Relative: 1 %
Eosinophils Absolute: 0.1 10*3/uL (ref 0.0–0.5)
Eosinophils Relative: 2 %
HCT: 27.4 % — ABNORMAL LOW (ref 36.0–46.0)
Hemoglobin: 9 g/dL — ABNORMAL LOW (ref 12.0–15.0)
Immature Granulocytes: 1 %
Lymphocytes Relative: 23 %
Lymphs Abs: 1.3 10*3/uL (ref 0.7–4.0)
MCH: 25.7 pg — ABNORMAL LOW (ref 26.0–34.0)
MCHC: 32.8 g/dL (ref 30.0–36.0)
MCV: 78.3 fL — ABNORMAL LOW (ref 80.0–100.0)
Monocytes Absolute: 0.8 10*3/uL (ref 0.1–1.0)
Monocytes Relative: 13 %
Neutro Abs: 3.6 10*3/uL (ref 1.7–7.7)
Neutrophils Relative %: 60 %
Platelet Count: 299 10*3/uL (ref 150–400)
RBC: 3.5 MIL/uL — ABNORMAL LOW (ref 3.87–5.11)
RDW: 17.8 % — ABNORMAL HIGH (ref 11.5–15.5)
WBC Count: 5.9 10*3/uL (ref 4.0–10.5)
nRBC: 0 % (ref 0.0–0.2)

## 2023-05-11 MED ORDER — SODIUM CHLORIDE 0.9 % IV SOLN
Freq: Once | INTRAVENOUS | Status: AC
  Filled 2023-05-11: qty 250

## 2023-05-11 MED ORDER — POTASSIUM CHLORIDE 20 MEQ/100ML IV SOLN
20.0000 meq | Freq: Once | INTRAVENOUS | Status: AC
  Administered 2023-05-11: 20 meq via INTRAVENOUS

## 2023-05-11 MED ORDER — POTASSIUM CHLORIDE CRYS ER 20 MEQ PO TBCR: 20.0000 meq | EXTENDED_RELEASE_TABLET | Freq: Three times a day (TID) | ORAL | 3 refills | Status: DC

## 2023-05-11 MED ORDER — ATROPINE SULFATE 1 MG/ML IV SOLN
0.5000 mg | Freq: Once | INTRAVENOUS | Status: AC | PRN
  Administered 2023-05-11: 0.5 mg via INTRAVENOUS
  Filled 2023-05-11: qty 1

## 2023-05-11 MED ORDER — SODIUM CHLORIDE 0.9 % IV SOLN
2350.0000 mg/m2 | INTRAVENOUS | Status: DC
  Administered 2023-05-11: 4500 mg via INTRAVENOUS
  Filled 2023-05-11: qty 90

## 2023-05-11 MED ORDER — DEXAMETHASONE SODIUM PHOSPHATE 10 MG/ML IJ SOLN
10.0000 mg | Freq: Once | INTRAMUSCULAR | Status: AC
  Administered 2023-05-11: 10 mg via INTRAVENOUS
  Filled 2023-05-11: qty 1

## 2023-05-11 MED ORDER — STERILE WATER FOR INJECTION IJ SOLN
5.0000 mL | Freq: Four times a day (QID) | OROMUCOSAL | 3 refills | Status: DC | PRN
  Filled 2023-05-11: qty 240, 6d supply, fill #0

## 2023-05-11 MED ORDER — PALONOSETRON HCL INJECTION 0.25 MG/5ML
0.2500 mg | Freq: Once | INTRAVENOUS | Status: AC
  Administered 2023-05-11: 0.25 mg via INTRAVENOUS
  Filled 2023-05-11: qty 5

## 2023-05-11 MED ORDER — SODIUM CHLORIDE 0.9 % IV SOLN
400.0000 mg/m2 | Freq: Once | INTRAVENOUS | Status: AC
  Administered 2023-05-11: 764 mg via INTRAVENOUS
  Filled 2023-05-11: qty 38.2

## 2023-05-11 MED ORDER — OLANZAPINE 5 MG PO TABS: ORAL_TABLET | ORAL | 3 refills | Status: DC

## 2023-05-11 MED ORDER — SODIUM CHLORIDE 0.9 % IV SOLN
68.0000 mg/m2 | Freq: Once | INTRAVENOUS | Status: AC
  Administered 2023-05-11: 129 mg via INTRAVENOUS
  Filled 2023-05-11: qty 30

## 2023-05-11 MED ORDER — SODIUM CHLORIDE 0.9 % IV SOLN: Freq: Every day | INTRAVENOUS | Status: DC | PRN

## 2023-05-11 NOTE — Telephone Encounter (Signed)
Spoke with patient regarding her Magic Mouthwash prescription. It needs to come from a compounding pharmacy. Faxed Rx to [facility]. I told pt how to get to that pharmacy.

## 2023-05-11 NOTE — Progress Notes (Signed)
Received critical lab result from [facility] in lab; potassium is 2.5. Read back. MD notified

## 2023-05-11 NOTE — Assessment & Plan Note (Addendum)
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    OCT 29th, 2024- CT CAP/ PET scan: progression.   Intense metabolic activity at the junction of the pancreas and duodenum not changed from prior. New hypermetabolic tissue position between the pancreas in the SMA is concerning for nodal metastasis;  New smaller focus hypermotility just RIGHT of the takeoff of the SMA is also concerning for nodal metastasis; . overall concern for progression of local peripancreatic nodal metastasis.  Patient currently on NALIRI chemotherapy.  # Proceed with NALIRI cycle #4  today. Labs-CBC/chemistries were reviewed with the patient- with growth factor Will plan imaging after 4. Will order scan today.  No significant improvement in the tumor marker at this time.  Will monitor tumor marker closely.   # Oral Mucositis G-2-3: Use Magic mouthwash/prescription; salt and baking soda rinses 3-4 times a day-for soreness in the mouth.  Recommend eating bland/nonspicy cold/soft foods.  Also use a gentle toothbrush.   # Diarrhea- G-1-2- recommend continue immodium; lomotil ordered. #Recommend Imodium one 1 pill after each bowel movement; up to 3-4  a day.  If diarrhea not improved with Imodium, then can take Lomotil/prescription sent.  # Severe Hypokalemia- 20 meq 3 times daily new prescription sent.  Recommend IV KCl today.  # back pain-  secondary to progressive disease:improved- currently on tramadol prn.  stable.    # Anemia- microcytic- Stable 9-10; NO IDA- ;  colo [EUS- 2021; oct 2022- colo; Dr.Anna]- stable.  # Severe hypokalemia potassium 2.8-.  Potassium  3.2- Continue K-Dur 20 mEq twice daily. stable.    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   # Hypertension- Continue Norvasc/Metoprlol.[ per pt At home- 130/70s   # mediport:  continue port flushes q 2-49M.stable.  #  Anxiety/depression/insomnia: s/p Josh Borders- recommend compliance with  cymblata- stable.  # DISPOSITION:  # Magic mouth wash # chemo today; ADD-IV  KC 20 me q ; 2 days later- pump off/ injection-  # PET scan prior to next visit  # Follow up in 2 week- MD; labs-port-cbc/cmp; ca 19-9; 5FU pump+ nab-irinotecan; -IV  KC 20 me q - pump off in after 2 days; udenyca injection-  # follow up in 4  week-MD:  labs-port-cbc/cmp; ca 19-9; 5FU pump+ nab-irinotecan; -IV  KC 20 me q; pump off in after 2 days; udenyca injection---Dr.B

## 2023-05-11 NOTE — Patient Instructions (Signed)
[facility] - A DEPT OF [facility]  Discharge Instructions: Thank you for choosing [facility] to provide your oncology and hematology care.  If you have a lab appointment with the Cancer Center, please go directly to the Cancer Center and check in at the registration area.  Wear comfortable clothing and clothing appropriate for easy access to any Portacath or PICC line.   We strive to give you quality time with your provider. You may need to reschedule your appointment if you arrive late (15 or more minutes).  Arriving late affects you and other patients whose appointments are after yours.  Also, if you miss three or more appointments without notifying the office, you may be dismissed from the clinic at the provider's discretion.      For prescription refill requests, have your pharmacy contact our office and allow 72 hours for refills to be completed.    Today you received the following chemotherapy and/or immunotherapy agents Onivyde, Leucovorin, Adrucil      To help prevent nausea and vomiting after your treatment, we encourage you to take your nausea medication as directed.  BELOW ARE SYMPTOMS THAT SHOULD BE REPORTED IMMEDIATELY: *FEVER GREATER THAN 100.4 F (38 C) OR HIGHER *CHILLS OR SWEATING *NAUSEA AND VOMITING THAT IS NOT CONTROLLED WITH YOUR NAUSEA MEDICATION *UNUSUAL SHORTNESS OF BREATH *UNUSUAL BRUISING OR BLEEDING *URINARY PROBLEMS (pain or burning when urinating, or frequent urination) *BOWEL PROBLEMS (unusual diarrhea, constipation, pain near the anus) TENDERNESS IN MOUTH AND THROAT WITH OR WITHOUT PRESENCE OF ULCERS (sore throat, sores in mouth, or a toothache) UNUSUAL RASH, SWELLING OR PAIN  UNUSUAL VAGINAL DISCHARGE OR ITCHING   Items with * indicate a potential emergency and should be followed up as soon as possible or go to the Emergency Department if any problems should occur.  Please show the CHEMOTHERAPY ALERT CARD  or IMMUNOTHERAPY ALERT CARD at check-in to the Emergency Department and triage nurse.  Should you have questions after your visit or need to cancel or reschedule your appointment, please contact [facility] - A DEPT OF [facility]  [phone] and follow the prompts.  Office hours are 8:00 a.m. to 4:30 p.m. Monday - Friday. Please note that voicemails left after 4:00 p.m. may not be returned until the following business day.  We are closed weekends and major holidays. You have access to a nurse at all times for urgent questions. Please call the main number to the clinic [phone] and follow the prompts.  For any non-urgent questions, you may also contact your provider using MyChart. We now offer e-Visits for anyone 31 and older to request care online for non-urgent symptoms. For details visit [email or web address].   Also download the MyChart app! Go to the app store, search "MyChart", open the app, select [place], and log in with your MyChart username and password.

## 2023-05-11 NOTE — Patient Instructions (Addendum)
#   Use Magic mouthwash/prescription;  # salt and baking soda rinses 3-4 times a day-for soreness in the mouth.    Recommend eating bland/nonspicy cold/soft foods.   # Also use a gentle toothbrush.   #Recommend Imodium one 1 pill after each bowel movement; up to 3-4  a day.   # If diarrhea not improved with Imodium, then can take Lomotil/prescription sent.  #Keep yourself hydrated; drink plenty of fluids.

## 2023-05-11 NOTE — Progress Notes (Signed)
[facility] CONSULT NOTE  Patient Care Team: Entzminger, Danton Clap, MD as PCP - General (Internal Medicine) Toy Cookey, FNP (Family Medicine) Jim Like, RN as Registered Nurse Scarlett Presto, RN (Inactive) as Registered Nurse Benita Gutter, RN as Oncology Nurse Navigator Almond Lint, MD as Consulting Physician (General Surgery) Earna Coder, MD as Consulting Physician (Internal Medicine)  CHIEF COMPLAINTS/PURPOSE OF CONSULTATION: pancreas adenocarcinoma  Oncology History Overview Note  #Pancreas adenocarcinoma-Stage IB- [id number]-[EUS- Dr.Spaete; [facility]/GI; mass 22x31mm; invading/abutting superior mesenteric vein; 2 enlarged lymph nodes peripancreatic/porta hepatis largest 10 x 5.8 mm-nonpathologic based on EUS criteria/no biopsy]-borderline resectable.  PET scan no evidence of distant metastatic disease.  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 Fredirick Maudlin September]  # 01/02/2020-FOLFIRINOX; Neulasta. S/p FOLFIRINOX 10 cycles- April 19th whipples-  s/p neoadjuvant FOLFIRINX. #10 cycles]-s/p Whipple's [on April 19th 2022]- ypT2 [3.5cm]; pN0/11;   DIS-CONTINUED  FOLFIRINOX cycle #11 & 12-  Sec to PN-2-3.  PANCREAS (EXOCRINE), CARCINOMA: Resection  Procedure: Whipple procedure  Tumor Site: Head of pancreas.  Tumor Size: 3.5 cm, slide measurement.  Histologic Type: Pancreatic ductal adenocarcinoma.  Histologic Grade: Moderately differentiated.  Tumor Extension: Into peripancreatic connective tissue.  Treatment Effect: Moderate treatment effect.  Lymphovascular Invasion: Not identified.  Perineural Invasion: Present.  Margins: All surgical margins are negative for carcinoma.  Regional Lymph Nodes:       Number of Lymph Nodes with Tumor: 0       Number of Lymph Nodes Examined: 15  Distant Metastasis:       Distant  Site(s) Involved: Not applicable.  Pathologic Stage Classification (pTNM, AJCC 8th Edition): ypT2, ypN0   # Borderline DM; HTN   AUG 18th, 2024- PET scan- Whipple procedure with abnormal hypermetabolism centered at the pancreaticojejunostomy, worrisome for disease recurrence. Associated pancreaticojejunostomy stent in place; Hypermetabolic adjacent retroperitoneal lymph nodes, worrisome for metastatic disease. Ca 19-9 rising.    SEP 3rd, 2024- single gem [3 weeks on 1 week off]-because of neuropathy.  # OCT 29th, 2024-CT scan abdomen pelvis-progression.  # DEC 2024- start NALIRI.     Cancer of head of pancreas (HCC)  12/04/2019 Initial Diagnosis   Cancer of head of pancreas (HCC)   12/19/2019 - 12/19/2019 Chemotherapy   The patient had PACLitaxel-protein bound (ABRAXANE) chemo infusion 250 mg, 125 mg/m2 = 250 mg, Intravenous,  Once, 1 of 4 cycles Administration: 250 mg (12/19/2019) gemcitabine (GEMZAR) 2,000 mg in sodium chloride 0.9 % 250 mL chemo infusion, 1,976 mg, Intravenous,  Once, 1 of 4 cycles Administration: 2,000 mg (12/19/2019)  for chemotherapy treatment.    01/02/2020 - 05/15/2020 Chemotherapy   Patient is on Treatment Plan : PANCREAS Modified FOLFIRINOX q14d x 4 cycles     11/30/2022 - 02/08/2023 Chemotherapy   Patient is on Treatment Plan : PANCREAS Gemcitabine D1,8, (1000) q21d     03/14/2023 -  Chemotherapy   Patient is on Treatment Plan : PANCREAS Liposomal Irinotecan + Leucovorin + 5-FU IVCI q14d      HISTORY OF PRESENTING ILLNESS: Patient ambulating-independently.  Alone.   [name] 68 y.o.  female history recurrent stage IV  pancreatic adeno ca -currently on NALIRI chemotherapy is here for follow-up.  Pt has had no appetite, has the occasional carnation drink. Since last treatment she has had burning/blisters in her mouth. Tongue feels like it's burning.  Patient complains of nausea and vomiting for 2  days last week.  Patient also had diarrhea for 3-4 loose  stools-did not take any anti-inflammatories or antidiarrheal.  Denies any fevers or chills.  No nausea no vomiting.  Abdominal pain/back pain improved.  She did not take any pain medication.  Patient continues to have tingling and numbness in the extremities- currently on gabapentin not significantly better or worse.   No fever no chills. Weight is stable.   Review of Systems  Constitutional:  Negative for chills, diaphoresis, fever and malaise/fatigue.  HENT:  Negative for nosebleeds and sore throat.   Eyes:  Negative for double vision.  Respiratory:  Negative for cough, hemoptysis, sputum production, shortness of breath and wheezing.   Cardiovascular:  Negative for chest pain, palpitations, orthopnea and leg swelling.  Gastrointestinal:  Negative for abdominal pain, blood in stool, constipation, diarrhea, heartburn, melena, nausea and vomiting.  Genitourinary:  Negative for dysuria, frequency and urgency.  Musculoskeletal:  Negative for back pain and joint pain.  Skin: Negative.  Negative for itching and rash.  Neurological:  Positive for tingling. Negative for dizziness, focal weakness, weakness and headaches.  Endo/Heme/Allergies:  Does not bruise/bleed easily.  Psychiatric/Behavioral:  Negative for depression. The patient is not nervous/anxious and does not have insomnia.      MEDICAL HISTORY:  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/04/2019   Complication of anesthesia    Fluttering heart    Hyperlipidemia    Hypertension    PONV (postoperative nausea and vomiting)    Pre-diabetes    Vertigo    none recently    SURGICAL HISTORY: Past Surgical History:  Procedure Laterality Date   CATARACT EXTRACTION W/PHACO Right 06/09/2021   Procedure: CATARACT EXTRACTION PHACO AND INTRAOCULAR LENS PLACEMENT (IOC) RIGHT malyugin;  Surgeon: Galen Manila, MD;  Location: [facility];   Service: Ophthalmology;  Laterality: Right;  12.40 1:07.0   COLONOSCOPY WITH PROPOFOL N/A 01/13/2021   Procedure: COLONOSCOPY WITH PROPOFOL;  Surgeon: Wyline Mood, MD;  Location: [facility] END[facility]COPY;  Service: Gastroenterology;  Laterality: N/A;   ENDOSCOPIC RETROGRADE CHOLANGIOPANCREATOGRAPHY (ERCP) WITH PROPOFOL N/A 11/16/2019   Procedure: ENDOSCOPIC RETROGRADE CHOLANGIOPANCREATOGRAPHY (ERCP) WITH PROPOFOL;  Surgeon: Midge Minium, MD;  Location: [facility] ENDOSCOPY;  Service: Endoscopy;  Laterality: N/A;   ERCP N/A 03/27/2020   Procedure: ENDOSCOPIC RETROGRADE CHOLANGIOPANCREATOGRAPHY (ERCP);  Surgeon: Midge Minium, MD;  Location: [facility] ENDOSCOPY;  Service: Endoscopy;  Laterality: N/A;   EUS N/A 11/29/2019   Procedure: FULL UPPER ENDOSCOPIC ULTRASOUND (EUS) RADIAL;  Surgeon: Doren Custard, MD;  Location: [facility] ENDOSCOPY;  Service: Gastroenterology;  Laterality: N/A;   IR FLUORO RM 30-60 MIN  07/30/2020   IR IMAGING GUIDED PORT INSERTION  12/14/2019   JEJUNOSTOMY Left 07/15/2020   Procedure: Rance Muir;  Surgeon: Almond Lint, MD;  Location: [facility] OR;  Service: General;  Laterality: Left;   LAPAROSCOPY N/A 07/15/2020   Procedure: DIAGNOSTIC LAPAROSCOPY;  Surgeon: Almond Lint, MD;  Location: [facility] OR;  Service: General;  Laterality: N/A;   right knee replacement Right [id number]   SHOULDER ARTHROSCOPY W/ ROTATOR CUFF REPAIR Right    WHIPPLE PROCEDURE N/A 07/15/2020   Procedure: WHIPPLE PROCEDURE;  Surgeon: Almond Lint, MD;  Location: [facility] OR;  Service: General;  Laterality: N/A;    SOCIAL HISTORY: Social History   Socioeconomic History   Marital status: Divorced    Spouse name: Not on file   Number of children:  Not on file   Years of education: Not on file   Highest education level: Not on file  Occupational History   Not on file  Tobacco Use   Smoking status: Never   Smokeless tobacco: Never  Vaping Use   Vaping status: Never Used  Substance and Sexual Activity   Alcohol use: No    Alcohol/week:  0.0 standard drinks of alcohol   Drug use: No   Sexual activity: Never  Other Topics Concern   Not on file  Social History Narrative   ** Merged History Encounter **       Lives in [place]; with mom and son; worked in dietary; never smoked; no alcohol.    Social Drivers of Corporate investment banker Strain: Low Risk  (12/01/2022)   Overall Financial Resource Strain (CARDIA)    Difficulty of Paying Living Expenses: Not hard at all  Food Insecurity: No Food Insecurity (12/01/2022)   Hunger Vital Sign    Worried About Running Out of Food in the Last Year: Never true    Ran Out of Food in the Last Year: Never true  Transportation Needs: No Transportation Needs (12/01/2022)   PRAPARE - Administrator, Civil Service (Medical): No    Lack of Transportation (Non-Medical): No  Physical Activity: Not on file  Stress: Not on file  Social Connections: Not on file  Intimate Partner Violence: Not At Risk (12/01/2022)   Humiliation, Afraid, Rape, and Kick questionnaire    Fear of Current or Ex-Partner: No    Emotionally Abused: No    Physically Abused: No    Sexually Abused: No    FAMILY HISTORY: Family History  Problem Relation Age of Onset   Diabetes Mother    Hyperlipidemia Mother    Hypertension Mother    Vision loss Mother    Arthritis Father    Hyperlipidemia Father    Hypertension Father    Breast cancer Maternal Aunt     ALLERGIES:  has no known allergies.  MEDICATIONS:  Current Outpatient Medications  Medication Sig Dispense Refill   acetaminophen (TYLENOL) 325 MG tablet Take 650 mg by mouth every 6 (six) hours as needed for moderate pain.     amLODipine (NORVASC) 10 MG tablet Take 1 tablet (10 mg total) by mouth daily. 30 tablet 0   Calcium Carbonate-Vit D-Min (CALCIUM 600+D PLUS MINERALS PO) Take 1 tablet by mouth in the morning and at bedtime.     diphenoxylate-atropine (LOMOTIL) 2.5-0.025 MG tablet Take 1 tablet by mouth 4 (four) times daily as needed for  diarrhea or loose stools. Take it along with immodium 60 tablet 0   DULoxetine (CYMBALTA) 30 MG capsule Take 1 capsule (30 mg total) by mouth daily. 30 capsule 3   Ferrous Bisglycinate Chelate 28 MG CAPS Take 1 tablet by mouth daily.     gabapentin (NEURONTIN) 300 MG capsule Take 300 mg by mouth daily.     lidocaine-prilocaine (EMLA) cream Apply on the port. 30 -45 min  prior to port access. 30 g 3   lisinopril (ZESTRIL) 10 MG tablet Take 10 mg by mouth daily.     metoprolol succinate (TOPROL-XL) 50 MG 24 hr tablet Take 1 tablet (50 mg total) by mouth daily. Take with or immediately following a meal. 30 tablet 0   OLANZapine (ZYPREXA) 5 MG tablet Take one pill at night. 30 tablet 3   ondansetron (ZOFRAN) 8 MG tablet One pill every 8 hours as needed for nausea/vomitting.  40 tablet 1   prochlorperazine (COMPAZINE) 10 MG tablet Take 1 tablet (10 mg total) by mouth every 6 (six) hours as needed for nausea or vomiting. 40 tablet 1   traMADol (ULTRAM) 50 MG tablet Take 1 tablet (50 mg total) by mouth every 8 (eight) hours as needed. 90 tablet 0   vitamin B-12 (CYANOCOBALAMIN) 1000 MCG tablet Take 1 tablet (1,000 mcg total) by mouth daily.     magic mouthwash (multi-ingredient) oral suspension Swish and swallow 5-10 mLs by mouth 4 (four) times daily as needed. 480 mL 3   potassium chloride SA (KLOR-CON M) 20 MEQ tablet Take 1 tablet (20 mEq total) by mouth 3 (three) times daily. 90 tablet 3   No current facility-administered medications for this visit.   Facility-Administered Medications Ordered in Other Visits  Medication Dose Route Frequency Provider Last Rate Last Admin   dexamethasone (DECADRON) injection 10 mg  10 mg Intravenous Once Louretta Shorten R, MD       fluorouracil (ADRUCIL) 5,000 mg in sodium chloride 0.9 % 150 mL chemo infusion  2,400 mg/m2 (Treatment Plan Recorded) Intravenous 1 day or 1 dose Earna Coder, MD       irinotecan LIPOSOME (ONIVYDE) 133.3 mg in sodium chloride  0.9 % 500 mL chemo infusion  70 mg/m2 (Treatment Plan Recorded) Intravenous Once Earna Coder, MD       leucovorin 764 mg in sodium chloride 0.9 % 250 mL infusion  400 mg/m2 (Treatment Plan Recorded) Intravenous Once Earna Coder, MD       palonosetron (ALOXI) injection 0.25 mg  0.25 mg Intravenous Once Louretta Shorten R, MD       potassium chloride 20 mEq in 100 mL IVPB  20 mEq Intravenous Once Louretta Shorten R, MD 100 mL/hr at 05/11/23 1016 20 mEq at 05/11/23 1016   sodium chloride flush (NS) 0.9 % injection 10 mL  10 mL Intravenous PRN Louretta Shorten R, MD   10 mL at 06/23/21 1012   sodium chloride flush (NS) 0.9 % injection 10 mL  10 mL Intracatheter PRN Earna Coder, MD   10 mL at 03/16/23 1311      PHYSICAL EXAMINATION: ECOG PERFORMANCE STATUS: 0 - Asymptomatic  Vitals:   05/11/23 0846  BP: (!) 148/79  Pulse: (!) 108  Temp: 98.5 F (36.9 C)  SpO2: 97%        Filed Weights   05/11/23 0846  Weight: 154 lb 6.4 oz (70 kg)       Physical Exam Vitals reviewed.  Constitutional:      Appearance: She is not ill-appearing.  HENT:     Head: Normocephalic and atraumatic.     Mouth/Throat:     Pharynx: No oropharyngeal exudate.  Eyes:     General: No scleral icterus. Cardiovascular:     Rate and Rhythm: Normal rate and regular rhythm.  Pulmonary:     Effort: Pulmonary effort is normal. No respiratory distress.     Breath sounds: No wheezing.  Abdominal:     General: There is no distension.     Palpations: Abdomen is soft.     Tenderness: There is no abdominal tenderness. There is no guarding.  Musculoskeletal:        General: No tenderness or deformity.  Lymphadenopathy:     Cervical: No cervical adenopathy.  Skin:    General: Skin is warm.     Coloration: Skin is not pale.  Neurological:     Mental Status: She  is alert and oriented to person, place, and time.  Psychiatric:        Mood and Affect: Mood and affect normal.         Behavior: Behavior normal.    LABORATORY DATA:  I have reviewed the data as listed Lab Results  Component Value Date   WBC 5.9 05/11/2023   HGB 9.0 (L) 05/11/2023   HCT 27.4 (L) 05/11/2023   MCV 78.3 (L) 05/11/2023   PLT 299 05/11/2023   Recent Labs    04/04/23 0828 04/19/23 0856 05/11/23 0830  NA 137 134* 133*  K 3.2* 3.3* 2.5*  CL 104 102 100  CO2 25 22 23   GLUCOSE 120* 143* 121*  BUN 9 7* 14  CREATININE 0.75 0.90 1.09*  CALCIUM 8.5* 8.3* 8.5*  GFRNONAA >60 >60 55*  PROT 6.6 6.6 6.9  ALBUMIN 3.1* 3.0* 3.1*  AST 13* 19 25  ALT 10 13 18   ALKPHOS 81 104 92  BILITOT 0.5 0.4 0.6   Component Ref Range & Units 1 mo ago (06/25/22) 4 mo ago (03/26/22) 6 mo ago (01/22/22) 8 mo ago (11/23/21) 1 yr ago (06/23/21) 1 yr ago (04/20/21) 1 yr ago (01/23/21)  CA 19-9 0 - 35 U/mL 24 21 CM 17 CM 12 CM 14 CM 13 CM 16 CM     RADIOGRAPHIC STUDIES: I have personally reviewed the radiological images as listed and agreed with the findings in the report. No results found.   ASSESSMENT & PLAN:   Cancer of head of pancreas (HCC) # AUG 2024- STAGE IV/METASTATIC- Recurrent [No biopsy] Pancreas adenocarcinoma-   NGS-tempus-No targets noted however MSI-QNS.    OCT 29th, 2024- CT CAP/ PET scan: progression.   Intense metabolic activity at the junction of the pancreas and duodenum not changed from prior. New hypermetabolic tissue position between the pancreas in the SMA is concerning for nodal metastasis;  New smaller focus hypermotility just RIGHT of the takeoff of the SMA is also concerning for nodal metastasis; . overall concern for progression of local peripancreatic nodal metastasis.  Patient currently on NALIRI chemotherapy.  # Proceed with NALIRI cycle #4  today. Labs-CBC/chemistries were reviewed with the patient- with growth factor Will plan imaging after 4. Will order scan today.  No significant improvement in the tumor marker at this time.  Will monitor tumor marker closely.    # Oral Mucositis G-2-3: Use Magic mouthwash/prescription; salt and baking soda rinses 3-4 times a day-for soreness in the mouth.  Recommend eating bland/nonspicy cold/soft foods.  Also use a gentle toothbrush.   # Diarrhea- G-1-2- recommend continue immodium; lomotil ordered. #Recommend Imodium one 1 pill after each bowel movement; up to 3-4  a day.  If diarrhea not improved with Imodium, then can take Lomotil/prescription sent.  # Severe Hypokalemia- 20 meq 3 times daily new prescription sent.  Recommend IV KCl today.  # back pain-  secondary to progressive disease:improved- currently on tramadol prn.  stable.    # Anemia- microcytic- Stable 9-10; NO IDA- ;  colo [EUS- 2021; oct 2022- colo; Dr.Anna]- stable.  # Severe hypokalemia potassium 2.8-.  Potassium  3.2- Continue K-Dur 20 mEq twice daily. stable.    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   # Hypertension- Continue Norvasc/Metoprlol.[ per pt At home- 130/70s   # mediport:  continue port flushes q 2-72M.stable.  # Anxiety/depression/insomnia: s/p Josh Borders- recommend compliance with  cymblata- stable.  # DISPOSITION:  #  Magic mouth wash # chemo today; ADD-IV  KC 20 me q ; 2 days later- pump off/ injection-  # PET scan prior to next visit  # Follow up in 2 week- MD; labs-port-cbc/cmp; ca 19-9; 5FU pump+ nab-irinotecan; -IV  KC 20 me q - pump off in after 2 days; udenyca injection-  # follow up in 4  week-MD:  labs-port-cbc/cmp; ca 19-9; 5FU pump+ nab-irinotecan; -IV  KC 20 me q; pump off in after 2 days; udenyca injection---Dr.B    Earna Coder, MD 05/11/2023

## 2023-05-11 NOTE — Progress Notes (Signed)
Pt has had no appetite, has the occasional carnation drink. Since last treatment she has had burning/blisters in her mouth. Tongue feels like it's burning. Pt has been sick the past week as well.

## 2023-05-12 LAB — CANCER ANTIGEN 19-9: CA 19-9: 401 U/mL — ABNORMAL HIGH (ref 0–35)

## 2023-05-13 ENCOUNTER — Inpatient Hospital Stay: Payer: 59

## 2023-05-13 VITALS — BP 139/79 | HR 89 | Temp 98.1°F | Resp 17

## 2023-05-13 DIAGNOSIS — Z5111 Encounter for antineoplastic chemotherapy: Secondary | ICD-10-CM | POA: Diagnosis not present

## 2023-05-13 DIAGNOSIS — C250 Malignant neoplasm of head of pancreas: Secondary | ICD-10-CM

## 2023-05-13 MED ORDER — SODIUM CHLORIDE 0.9% FLUSH
10.0000 mL | INTRAVENOUS | Status: DC | PRN
  Administered 2023-05-13: 10 mL
  Filled 2023-05-13: qty 10

## 2023-05-13 MED ORDER — HEPARIN SOD (PORK) LOCK FLUSH 100 UNIT/ML IV SOLN
500.0000 [IU] | Freq: Once | INTRAVENOUS | Status: AC | PRN
  Administered 2023-05-13: 500 [IU]
  Filled 2023-05-13: qty 5

## 2023-05-13 MED ORDER — PEGFILGRASTIM-CBQV 6 MG/0.6ML ~~LOC~~ SOSY
6.0000 mg | PREFILLED_SYRINGE | Freq: Once | SUBCUTANEOUS | Status: AC
Start: 2023-05-13 — End: 2023-05-13
  Administered 2023-05-13: 6 mg via SUBCUTANEOUS

## 2023-05-17 ENCOUNTER — Ambulatory Visit: Payer: 59

## 2023-05-17 ENCOUNTER — Ambulatory Visit: Payer: 59 | Admitting: Internal Medicine

## 2023-05-17 ENCOUNTER — Other Ambulatory Visit: Payer: 59

## 2023-05-18 ENCOUNTER — Encounter: Payer: Self-pay | Admitting: Internal Medicine

## 2023-05-20 ENCOUNTER — Encounter: Payer: Self-pay | Admitting: Internal Medicine

## 2023-05-24 ENCOUNTER — Ambulatory Visit
Admission: RE | Admit: 2023-05-24 | Discharge: 2023-05-24 | Disposition: A | Discharge status: Home / Self Care | Payer: 59 | Source: Ambulatory Visit | Attending: Internal Medicine | Admitting: Internal Medicine

## 2023-05-24 DIAGNOSIS — C250 Malignant neoplasm of head of pancreas: Secondary | ICD-10-CM | POA: Insufficient documentation

## 2023-05-24 LAB — GLUCOSE, CAPILLARY: Glucose-Capillary: 100 mg/dL — ABNORMAL HIGH (ref 70–99)

## 2023-05-24 MED ORDER — FLUDEOXYGLUCOSE F - 18 (FDG) INJECTION
7.8300 | Freq: Once | INTRAVENOUS | Status: AC | PRN
  Administered 2023-05-24: 7.83 via INTRAVENOUS

## 2023-05-25 ENCOUNTER — Ambulatory Visit: Payer: 59

## 2023-05-25 ENCOUNTER — Inpatient Hospital Stay: Payer: 59

## 2023-05-25 ENCOUNTER — Encounter: Payer: Self-pay | Admitting: Internal Medicine

## 2023-05-25 ENCOUNTER — Inpatient Hospital Stay (HOSPITAL_BASED_OUTPATIENT_CLINIC_OR_DEPARTMENT_OTHER): Payer: 59 | Admitting: Internal Medicine

## 2023-05-25 VITALS — BP 139/89 | HR 118 | Temp 96.7°F | Ht 64.0 in | Wt 148.0 lb

## 2023-05-25 VITALS — HR 98

## 2023-05-25 DIAGNOSIS — C250 Malignant neoplasm of head of pancreas: Secondary | ICD-10-CM | POA: Diagnosis not present

## 2023-05-25 DIAGNOSIS — E876 Hypokalemia: Secondary | ICD-10-CM

## 2023-05-25 DIAGNOSIS — Z5111 Encounter for antineoplastic chemotherapy: Secondary | ICD-10-CM | POA: Diagnosis not present

## 2023-05-25 LAB — CBC WITH DIFFERENTIAL (CANCER CENTER ONLY)
Abs Immature Granulocytes: 0.08 10*3/uL — ABNORMAL HIGH (ref 0.00–0.07)
Basophils Absolute: 0 10*3/uL (ref 0.0–0.1)
Basophils Relative: 1 %
Eosinophils Absolute: 0.1 10*3/uL (ref 0.0–0.5)
Eosinophils Relative: 2 %
HCT: 26.4 % — ABNORMAL LOW (ref 36.0–46.0)
Hemoglobin: 8.6 g/dL — ABNORMAL LOW (ref 12.0–15.0)
Immature Granulocytes: 2 %
Lymphocytes Relative: 28 %
Lymphs Abs: 1.2 10*3/uL (ref 0.7–4.0)
MCH: 25.3 pg — ABNORMAL LOW (ref 26.0–34.0)
MCHC: 32.6 g/dL (ref 30.0–36.0)
MCV: 77.6 fL — ABNORMAL LOW (ref 80.0–100.0)
Monocytes Absolute: 0.4 10*3/uL (ref 0.1–1.0)
Monocytes Relative: 9 %
Neutro Abs: 2.5 10*3/uL (ref 1.7–7.7)
Neutrophils Relative %: 58 %
Platelet Count: 320 10*3/uL (ref 150–400)
RBC: 3.4 MIL/uL — ABNORMAL LOW (ref 3.87–5.11)
RDW: 18.2 % — ABNORMAL HIGH (ref 11.5–15.5)
WBC Count: 4.4 10*3/uL (ref 4.0–10.5)
nRBC: 0 % (ref 0.0–0.2)

## 2023-05-25 LAB — CMP (CANCER CENTER ONLY)
ALT: 17 U/L (ref 0–44)
AST: 22 U/L (ref 15–41)
Albumin: 3.3 g/dL — ABNORMAL LOW (ref 3.5–5.0)
Alkaline Phosphatase: 107 U/L (ref 38–126)
Anion gap: 10 (ref 5–15)
BUN: 6 mg/dL — ABNORMAL LOW (ref 8–23)
CO2: 20 mmol/L — ABNORMAL LOW (ref 22–32)
Calcium: 8.7 mg/dL — ABNORMAL LOW (ref 8.9–10.3)
Chloride: 102 mmol/L (ref 98–111)
Creatinine: 1 mg/dL (ref 0.44–1.00)
GFR, Estimated: >60 mL/min (ref >60)
Glucose, Bld: 123 mg/dL — ABNORMAL HIGH (ref 70–99)
Potassium: 3.4 mmol/L — ABNORMAL LOW (ref 3.5–5.1)
Sodium: 132 mmol/L — ABNORMAL LOW (ref 135–145)
Total Bilirubin: 0.6 mg/dL (ref 0.0–1.2)
Total Protein: 7.1 g/dL (ref 6.5–8.1)

## 2023-05-25 MED ORDER — SODIUM CHLORIDE 0.9 % IV SOLN
400.0000 mg/m2 | Freq: Once | INTRAVENOUS | Status: AC
  Administered 2023-05-25: 764 mg via INTRAVENOUS
  Filled 2023-05-25: qty 38.2

## 2023-05-25 MED ORDER — DEXAMETHASONE SODIUM PHOSPHATE 10 MG/ML IJ SOLN
10.0000 mg | Freq: Once | INTRAMUSCULAR | Status: AC
Start: 2023-05-25 — End: 2023-05-25
  Administered 2023-05-25: 10 mg via INTRAVENOUS
  Filled 2023-05-25: qty 1

## 2023-05-25 MED ORDER — SODIUM CHLORIDE 0.9 % IV SOLN
2350.0000 mg/m2 | INTRAVENOUS | Status: DC
  Administered 2023-05-25: 4500 mg via INTRAVENOUS
  Filled 2023-05-25: qty 90

## 2023-05-25 MED ORDER — SODIUM CHLORIDE 0.9 % IV SOLN: Freq: Every day | INTRAVENOUS | Status: DC | PRN

## 2023-05-25 MED ORDER — SODIUM CHLORIDE 0.9 % IV SOLN
INTRAVENOUS | Status: DC
  Filled 2023-05-25: qty 250

## 2023-05-25 MED ORDER — PALONOSETRON HCL INJECTION 0.25 MG/5ML
0.2500 mg | Freq: Once | INTRAVENOUS | Status: AC
  Administered 2023-05-25: 0.25 mg via INTRAVENOUS
  Filled 2023-05-25: qty 5

## 2023-05-25 MED ORDER — POTASSIUM CHLORIDE 20 MEQ/100ML IV SOLN
20.0000 meq | Freq: Once | INTRAVENOUS | Status: AC
Start: 2023-05-25 — End: 2023-05-25
  Administered 2023-05-25: 20 meq via INTRAVENOUS

## 2023-05-25 MED ORDER — SODIUM CHLORIDE 0.9 % IV SOLN
68.0000 mg/m2 | Freq: Once | INTRAVENOUS | Status: AC
  Administered 2023-05-25: 129 mg via INTRAVENOUS
  Filled 2023-05-25: qty 30

## 2023-05-25 NOTE — Progress Notes (Signed)
 [facility] CONSULT NOTE  Patient Care Team: Entzminger, Danton Clap, MD as PCP - General (Internal Medicine) Toy Cookey, FNP (Family Medicine) Jim Like, RN as Registered Nurse Scarlett Presto, RN (Inactive) as Registered Nurse Benita Gutter, RN as Oncology Nurse Navigator Almond Lint, MD as Consulting Physician (General Surgery) Earna Coder, MD as Consulting Physician (Internal Medicine)  CHIEF COMPLAINTS/PURPOSE OF CONSULTATION: pancreas adenocarcinoma  Oncology History Overview Note  #Pancreas adenocarcinoma-Stage IB- [id number]-[EUS- Dr.Spaete; [facility]/GI; mass 22x89mm; invading/abutting superior mesenteric vein; 2 enlarged lymph nodes peripancreatic/porta hepatis largest 10 x 5.8 mm-nonpathologic based on EUS criteria/no biopsy]-borderline resectable.  PET scan no evidence of distant metastatic disease.  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 Fredirick Maudlin September]  # 01/02/2020-FOLFIRINOX; Neulasta. S/p FOLFIRINOX 10 cycles- April 19th whipples-  s/p neoadjuvant FOLFIRINX. #10 cycles]-s/p Whipple's [on April 19th 2022]- ypT2 [3.5cm]; pN0/11;   DIS-CONTINUED  FOLFIRINOX cycle #11 & 12-  Sec to PN-2-3.  PANCREAS (EXOCRINE), CARCINOMA: Resection  Procedure: Whipple procedure  Tumor Site: Head of pancreas.  Tumor Size: 3.5 cm, slide measurement.  Histologic Type: Pancreatic ductal adenocarcinoma.  Histologic Grade: Moderately differentiated.  Tumor Extension: Into peripancreatic connective tissue.  Treatment Effect: Moderate treatment effect.  Lymphovascular Invasion: Not identified.  Perineural Invasion: Present.  Margins: All surgical margins are negative for carcinoma.  Regional Lymph Nodes:       Number of Lymph Nodes with Tumor: 0       Number of Lymph Nodes Examined: 15  Distant Metastasis:       Distant  Site(s) Involved: Not applicable.  Pathologic Stage Classification (pTNM, AJCC 8th Edition): ypT2, ypN0   # Borderline DM; HTN   AUG 18th, 2024- PET scan- Whipple procedure with abnormal hypermetabolism centered at the pancreaticojejunostomy, worrisome for disease recurrence. Associated pancreaticojejunostomy stent in place; Hypermetabolic adjacent retroperitoneal lymph nodes, worrisome for metastatic disease. Ca 19-9 rising.    SEP 3rd, 2024- single gem [3 weeks on 1 week off]-because of neuropathy.  # OCT 29th, 2024-CT scan abdomen pelvis-progression.  # DEC 2024- start NALIRI.     Cancer of head of pancreas (HCC)  12/04/2019 Initial Diagnosis   Cancer of head of pancreas (HCC)   12/19/2019 - 12/19/2019 Chemotherapy   The patient had PACLitaxel-protein bound (ABRAXANE) chemo infusion 250 mg, 125 mg/m2 = 250 mg, Intravenous,  Once, 1 of 4 cycles Administration: 250 mg (12/19/2019) gemcitabine (GEMZAR) 2,000 mg in sodium chloride 0.9 % 250 mL chemo infusion, 1,976 mg, Intravenous,  Once, 1 of 4 cycles Administration: 2,000 mg (12/19/2019)  for chemotherapy treatment.    01/02/2020 - 05/15/2020 Chemotherapy   Patient is on Treatment Plan : PANCREAS Modified FOLFIRINOX q14d x 4 cycles     11/30/2022 - 02/08/2023 Chemotherapy   Patient is on Treatment Plan : PANCREAS Gemcitabine D1,8, (1000) q21d     03/14/2023 -  Chemotherapy   Patient is on Treatment Plan : PANCREAS Liposomal Irinotecan + Leucovorin + 5-FU IVCI q14d      HISTORY OF PRESENTING ILLNESS: Patient ambulating-independently.  Alone.   Robin Daniels 68 y.o.  female history recurrent stage IV  pancreatic adeno ca -currently on NALIRI chemotherapy is here for follow-up/  to review the results of PET.   EMS called on her last Sunday due to choking on potassium pill. Now on powder rx. Appetite 25% normal, carnation drink 2 per day.  Improvement of oral ulceration with magic mouth ash/salt rinse.   Denies any fevers or chills.  No  nausea no vomiting.  Abdominal pain/back pain improved.  She did not take any pain medication.  Patient continues to have tingling and numbness in the extremities- currently on gabapentin not significantly better or worse.   No fever no chills. Weight is stable.   Review of Systems  Constitutional:  Negative for chills, diaphoresis, fever and malaise/fatigue.  HENT:  Negative for nosebleeds and sore throat.   Eyes:  Negative for double vision.  Respiratory:  Negative for cough, hemoptysis, sputum production, shortness of breath and wheezing.   Cardiovascular:  Negative for chest pain, palpitations, orthopnea and leg swelling.  Gastrointestinal:  Negative for abdominal pain, blood in stool, constipation, diarrhea, heartburn, melena, nausea and vomiting.  Genitourinary:  Negative for dysuria, frequency and urgency.  Musculoskeletal:  Negative for back pain and joint pain.  Skin: Negative.  Negative for itching and rash.  Neurological:  Positive for tingling. Negative for dizziness, focal weakness, weakness and headaches.  Endo/Heme/Allergies:  Does not bruise/bleed easily.  Psychiatric/Behavioral:  Negative for depression. The patient is not nervous/anxious and does not have insomnia.      MEDICAL HISTORY:  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/04/2019   Complication of anesthesia    Fluttering heart    Hyperlipidemia    Hypertension    PONV (postoperative nausea and vomiting)    Pre-diabetes    Vertigo    none recently    SURGICAL HISTORY: Past Surgical History:  Procedure Laterality Date   CATARACT EXTRACTION W/PHACO Right 06/09/2021   Procedure: CATARACT EXTRACTION PHACO AND INTRAOCULAR LENS PLACEMENT (IOC) RIGHT malyugin;  Surgeon: Galen Manila, MD;  Location: [facility];  Service: Ophthalmology;  Laterality: Right;  12.40 1:07.0   COLONOSCOPY WITH PROPOFOL N/A  01/13/2021   Procedure: COLONOSCOPY WITH PROPOFOL;  Surgeon: Wyline Mood, MD;  Location: [facility];  Service: Gastroenterology;  Laterality: N/A;   ENDOSCOPIC RETROGRADE CHOLANGIOPANCREATOGRAPHY (ERCP) WITH PROPOFOL N/A 11/16/2019   Procedure: ENDOSCOPIC RETROGRADE CHOLANGIOPANCREATOGRAPHY (ERCP) WITH PROPOFOL;  Surgeon: Midge Minium, MD;  Location: [facility] END[facility]COPY;  Service: Endoscopy;  Laterality: N/A;   ERCP N/A 03/27/2020   Procedure: ENDOSCOPIC RETROGRADE CHOLANGIOPANCREATOGRAPHY (ERCP);  Surgeon: Midge Minium, MD;  Location: [facility] END[facility]COPY;  Service: Endoscopy;  Laterality: N/A;   EUS N/A 11/29/2019   Procedure: FULL UPPER ENDOSCOPIC ULTRASOUND (EUS) RADIAL;  Surgeon: Doren Custard, MD;  Location: [facility] ENDOSCOPY;  Service: Gastroenterology;  Laterality: N/A;   IR FLUORO RM 30-60 MIN  07/30/2020   IR IMAGING GUIDED PORT INSERTION  12/14/2019   JEJUNOSTOMY Left 07/15/2020   Procedure: Rance Muir;  Surgeon: Almond Lint, MD;  Location: [facility] OR;  Service: General;  Laterality: Left;   LAPAROSCOPY N/A 07/15/2020   Procedure: DIAGNOSTIC LAPAROSCOPY;  Surgeon: Almond Lint, MD;  Location: [facility] OR;  Service: General;  Laterality: N/A;   right knee replacement Right [id number]   SHOULDER ARTHROSCOPY W/ ROTATOR CUFF REPAIR Right    WHIPPLE PROCEDURE N/A 07/15/2020   Procedure: WHIPPLE PROCEDURE;  Surgeon: Almond Lint, MD;  Location: [facility] OR;  Service: General;  Laterality: N/A;    SOCIAL HISTORY: Social History   Socioeconomic History   Marital status: Divorced    Spouse name: Not on file   Number of children: Not on file   Years of education:  Not on file   Highest education level: Not on file  Occupational History   Not on file  Tobacco Use   Smoking status: Never   Smokeless tobacco: Never  Vaping Use   Vaping status: Never Used  Substance and Sexual Activity   Alcohol use: No    Alcohol/week: 0.0 standard drinks of alcohol   Drug use: No   Sexual activity: Never  Other Topics  Concern   Not on file  Social History Narrative   ** Merged History Encounter **       Lives in [place]; with mom and son; worked in dietary; never smoked; no alcohol.    Social Drivers of Corporate investment banker Strain: Low Risk  (12/01/2022)   Overall Financial Resource Strain (CARDIA)    Difficulty of Paying Living Expenses: Not hard at all  Food Insecurity: No Food Insecurity (12/01/2022)   Hunger Vital Sign    Worried About Running Out of Food in the Last Year: Never true    Ran Out of Food in the Last Year: Never true  Transportation Needs: No Transportation Needs (12/01/2022)   PRAPARE - Administrator, Civil Service (Medical): No    Lack of Transportation (Non-Medical): No  Physical Activity: Not on file  Stress: Not on file  Social Connections: Not on file  Intimate Partner Violence: Not At Risk (12/01/2022)   Humiliation, Afraid, Rape, and Kick questionnaire    Fear of Current or Ex-Partner: No    Emotionally Abused: No    Physically Abused: No    Sexually Abused: No    FAMILY HISTORY: Family History  Problem Relation Age of Onset   Diabetes Mother    Hyperlipidemia Mother    Hypertension Mother    Vision loss Mother    Arthritis Father    Hyperlipidemia Father    Hypertension Father    Breast cancer Maternal Aunt     ALLERGIES:  has no known allergies.  MEDICATIONS:  Current Outpatient Medications  Medication Sig Dispense Refill   acetaminophen (TYLENOL) 325 MG tablet Take 650 mg by mouth every 6 (six) hours as needed for moderate pain.     amLODipine (NORVASC) 10 MG tablet Take 1 tablet (10 mg total) by mouth daily. 30 tablet 0   Calcium Carbonate-Vit D-Min (CALCIUM 600+D PLUS MINERALS PO) Take 1 tablet by mouth in the morning and at bedtime.     diphenoxylate-atropine (LOMOTIL) 2.5-0.025 MG tablet Take 1 tablet by mouth 4 (four) times daily as needed for diarrhea or loose stools. Take it along with immodium 60 tablet 0   DULoxetine  (CYMBALTA) 30 MG capsule Take 1 capsule (30 mg total) by mouth daily. 30 capsule 3   Ferrous Bisglycinate Chelate 28 MG CAPS Take 1 tablet by mouth daily.     gabapentin (NEURONTIN) 300 MG capsule Take 300 mg by mouth daily.     lidocaine-prilocaine (EMLA) cream Apply on the port. 30 -45 min  prior to port access. 30 g 3   lisinopril (ZESTRIL) 10 MG tablet Take 10 mg by mouth daily.     magic mouthwash (multi-ingredient) oral suspension Swish and swallow 5-10 mLs by mouth 4 (four) times daily as needed. 480 mL 3   metoprolol succinate (TOPROL-XL) 50 MG 24 hr tablet Take 1 tablet (50 mg total) by mouth daily. Take with or immediately following a meal. 30 tablet 0   OLANZapine (ZYPREXA) 5 MG tablet Take one pill at night. 30 tablet 3  ondansetron (ZOFRAN) 8 MG tablet One pill every 8 hours as needed for nausea/vomitting. 40 tablet 1   potassium & sodium phosphates (PHOS-NAK) 280-160-250 MG PACK Take 1 packet by mouth 4 (four) times daily -  before meals and at bedtime.     prochlorperazine (COMPAZINE) 10 MG tablet Take 1 tablet (10 mg total) by mouth every 6 (six) hours as needed for nausea or vomiting. 40 tablet 1   traMADol (ULTRAM) 50 MG tablet Take 1 tablet (50 mg total) by mouth every 8 (eight) hours as needed. 90 tablet 0   vitamin B-12 (CYANOCOBALAMIN) 1000 MCG tablet Take 1 tablet (1,000 mcg total) by mouth daily.     No current facility-administered medications for this visit.   Facility-Administered Medications Ordered in Other Visits  Medication Dose Route Frequency Provider Last Rate Last Admin   0.9 %  sodium chloride infusion   Intravenous Continuous Earna Coder, MD 10 mL/hr at 05/25/23 0949 New Bag at 05/25/23 0949   fluorouracil (ADRUCIL) 4,500 mg in sodium chloride 0.9 % 60 mL chemo infusion  2,350 mg/m2 (Treatment Plan Recorded) Intravenous 1 day or 1 dose Earna Coder, MD       irinotecan LIPOSOME (ONIVYDE) 129 mg in sodium chloride 0.9 % 500 mL chemo infusion   68 mg/m2 (Treatment Plan Recorded) Intravenous Once Earna Coder, MD       leucovorin 764 mg in sodium chloride 0.9 % 250 mL infusion  400 mg/m2 (Treatment Plan Recorded) Intravenous Once Louretta Shorten R, MD       potassium chloride 20 mEq in 100 mL IVPB  20 mEq Intravenous Once Louretta Shorten R, MD 100 mL/hr at 05/25/23 1009 20 mEq at 05/25/23 1009   sodium chloride flush (NS) 0.9 % injection 10 mL  10 mL Intravenous PRN Louretta Shorten R, MD   10 mL at 06/23/21 1012   sodium chloride flush (NS) 0.9 % injection 10 mL  10 mL Intracatheter PRN Earna Coder, MD   10 mL at 03/16/23 1311      PHYSICAL EXAMINATION: ECOG PERFORMANCE STATUS: 0 - Asymptomatic  Vitals:   05/25/23 0840  BP: 139/89  Pulse: (!) 118  Temp: (!) 96.7 F (35.9 C)  SpO2: 99%        Filed Weights   05/25/23 0840  Weight: 148 lb (67.1 kg)       Physical Exam Vitals reviewed.  Constitutional:      Appearance: She is not ill-appearing.  HENT:     Head: Normocephalic and atraumatic.     Mouth/Throat:     Pharynx: No oropharyngeal exudate.  Eyes:     General: No scleral icterus. Cardiovascular:     Rate and Rhythm: Normal rate and regular rhythm.  Pulmonary:     Effort: Pulmonary effort is normal. No respiratory distress.     Breath sounds: No wheezing.  Abdominal:     General: There is no distension.     Palpations: Abdomen is soft.     Tenderness: There is no abdominal tenderness. There is no guarding.  Musculoskeletal:        General: No tenderness or deformity.  Lymphadenopathy:     Cervical: No cervical adenopathy.  Skin:    General: Skin is warm.     Coloration: Skin is not pale.  Neurological:     Mental Status: She is alert and oriented to person, place, and time.  Psychiatric:        Mood and Affect: Mood  and affect normal.        Behavior: Behavior normal.    LABORATORY DATA:  I have reviewed the data as listed Lab Results  Component Value Date    WBC 4.4 05/25/2023   HGB 8.6 (L) 05/25/2023   HCT 26.4 (L) 05/25/2023   MCV 77.6 (L) 05/25/2023   PLT 320 05/25/2023   Recent Labs    04/19/23 0856 05/11/23 0830 05/25/23 0830  NA 134* 133* 132*  K 3.3* 2.5* 3.4*  CL 102 100 102  CO2 22 23 20*  GLUCOSE 143* 121* 123*  BUN 7* 14 6*  CREATININE 0.90 1.09* 1.00  CALCIUM 8.3* 8.5* 8.7*  GFRNONAA >60 55* >60  PROT 6.6 6.9 7.1  ALBUMIN 3.0* 3.1* 3.3*  AST 19 25 22   ALT 13 18 17   ALKPHOS 104 92 107  BILITOT 0.4 0.6 0.6   Component Ref Range & Units 1 mo ago (06/25/22) 4 mo ago (03/26/22) 6 mo ago (01/22/22) 8 mo ago (11/23/21) 1 yr ago (06/23/21) 1 yr ago (04/20/21) 1 yr ago (01/23/21)  CA 19-9 0 - 35 U/mL 24 21 CM 17 CM 12 CM 14 CM 13 CM 16 CM     RADIOGRAPHIC STUDIES: I have personally reviewed the radiological images as listed and agreed with the findings in the report. No results found.   ASSESSMENT & PLAN:   Cancer of head of pancreas (HCC) # AUG 2024- STAGE IV/METASTATIC- Recurrent [No biopsy] Pancreas adenocarcinoma-   NGS-tempus-No targets noted however MSI-QNS.    OCT 29th, 2024- CT CAP/ PET scan: progression.   Intense metabolic activity at the junction of the pancreas and duodenum not changed from prior. New hypermetabolic tissue position between the pancreas in the SMA is concerning for nodal metastasis;  New smaller focus hypermotility just RIGHT of the takeoff of the SMA is also concerning for nodal metastasis; . overall concern for progression of local peripancreatic nodal metastasis.  Patient currently on NALIRI chemotherapy.  # Proceed with NALIRI cycle #5  today. Labs-CBC/chemistries were reviewed with the patient- with growth factor awaiting results of the PET scan done yesterday.  No significant improvement in the tumor marker at this time.  Will monitor tumor marker closely.   # Oral Mucositis G-1: improved with Magic mouthwash/prescription; salt and baking soda rinses 3-4 times- continue 3-4 times  day.   # Diarrhea- G-1-2-  stable- continue immodium; lomotil ordered.  # Severe Hypokalemia- Potassium  3.4- poor tolerance to pills- on PhosNaK- TID [PCP]- if not will prescribe Klorcon liqui- Recommend IV KCl today.  # back pain-  secondary to progressive disease:improved- currently on tramadol prn.  stable.    # Anemia- microcytic- Stable 9-10; NO IDA- ;  colo [EUS- 2021; oct 2022- colo; Dr.Anna]- stable.  # Severe hypokalemia potassium 2.8-.    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   # Hypertension- Continue Norvasc/Metoprlol.[ per pt At home- 130/70s   # mediport:  continue port flushes q 2-57M.stable.  # Anxiety/depression/insomnia: s/p Josh Borders- recommend compliance with  cymblata- stable.  # DISPOSITION:  # re-check Heart rate- and inform me- prior to chemo # chemo today; IV  KC 20 me q ; 2 days later- pump off/ injection-  # Follow up in 2 week- MD; labs-port-cbc/cmp; ca 19-9; 5FU pump+ nab-irinotecan; -IV  KC 20 me q - pump off in after 2 days; udenyca injection-  # follow up in 4  week-MD:  labs-port-cbc/cmp;  ca 19-9; 5FU pump+ nab-irinotecan; -IV  KC 20 me q; pump off in after 2 days; udenyca injection---Dr.B    Earna Coder, MD 05/25/2023

## 2023-05-25 NOTE — Progress Notes (Signed)
 PET 05/24/23, called for read.  EMS called on her last Sunday due to choking on potassium pill. Now on powder rx.  Appetite 25% normal, carnation drink 2 per day.

## 2023-05-25 NOTE — Assessment & Plan Note (Addendum)
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    OCT 29th, 2024- CT CAP/ PET scan: progression.   Intense metabolic activity at the junction of the pancreas and duodenum not changed from prior. New hypermetabolic tissue position between the pancreas in the SMA is concerning for nodal metastasis;  New smaller focus hypermotility just RIGHT of the takeoff of the SMA is also concerning for nodal metastasis; . overall concern for progression of local peripancreatic nodal metastasis.  Patient currently on NALIRI chemotherapy.  # Proceed with NALIRI cycle #5  today. Labs-CBC/chemistries were reviewed with the patient- with growth factor awaiting results of the PET scan done yesterday.  No significant improvement in the tumor marker at this time.  Will monitor tumor marker closely.   # Oral Mucositis G-1: improved with Magic mouthwash/prescription; salt and baking soda rinses 3-4 times- continue 3-4 times day.   # Diarrhea- G-1-2-  stable- continue immodium; lomotil ordered.  # Severe Hypokalemia- Potassium  3.4- poor tolerance to pills- on PhosNaK- TID [PCP]- if not will prescribe Klorcon liqui- Recommend IV KCl today.  # back pain-  secondary to progressive disease:improved- currently on tramadol prn.  stable.    # Anemia- microcytic- Stable 9-10; NO IDA- ;  colo [EUS- 2021; oct 2022- colo; Dr.Anna]- stable.  # Severe hypokalemia potassium 2.8-.    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   # Hypertension- Continue Norvasc/Metoprlol.[ per pt At home- 130/70s   # mediport:  continue port flushes q 2-76M.stable.  # Anxiety/depression/insomnia: s/p Josh Borders- recommend compliance with  cymblata- stable.  # DISPOSITION:  # re-check Heart rate- and inform me- prior to chemo # chemo today; IV  KC 20 me q ; 2 days later- pump off/ injection-  # Follow up  in 2 week- MD; labs-port-cbc/cmp; ca 19-9; 5FU pump+ nab-irinotecan; -IV  KC 20 me q - pump off in after 2 days; udenyca injection-  # follow up in 4  week-MD:  labs-port-cbc/cmp; ca 19-9; 5FU pump+ nab-irinotecan; -IV  KC 20 me q; pump off in after 2 days; udenyca injection---Dr.B

## 2023-05-25 NOTE — Patient Instructions (Signed)
 [facility] - A DEPT OF [facility]  Discharge Instructions: Thank you for choosing [facility] to provide your oncology and hematology care.  If you have a lab appointment with the Cancer Center, please go directly to the Cancer Center and check in at the registration area.  Wear comfortable clothing and clothing appropriate for easy access to any Portacath or PICC line.   We strive to give you quality time with your provider. You may need to reschedule your appointment if you arrive late (15 or more minutes).  Arriving late affects you and other patients whose appointments are after yours.  Also, if you miss three or more appointments without notifying the office, you may be dismissed from the clinic at the provider's discretion.      For prescription refill requests, have your pharmacy contact our office and allow 72 hours for refills to be completed.    Today you received the following chemotherapy and/or immunotherapy agents- irinotecan liposomal, leucovorin, 5FU      To help prevent nausea and vomiting after your treatment, we encourage you to take your nausea medication as directed.  BELOW ARE SYMPTOMS THAT SHOULD BE REPORTED IMMEDIATELY: *FEVER GREATER THAN 100.4 F (38 C) OR HIGHER *CHILLS OR SWEATING *NAUSEA AND VOMITING THAT IS NOT CONTROLLED WITH YOUR NAUSEA MEDICATION *UNUSUAL SHORTNESS OF BREATH *UNUSUAL BRUISING OR BLEEDING *URINARY PROBLEMS (pain or burning when urinating, or frequent urination) *BOWEL PROBLEMS (unusual diarrhea, constipation, pain near the anus) TENDERNESS IN MOUTH AND THROAT WITH OR WITHOUT PRESENCE OF ULCERS (sore throat, sores in mouth, or a toothache) UNUSUAL RASH, SWELLING OR PAIN  UNUSUAL VAGINAL DISCHARGE OR ITCHING   Items with * indicate a potential emergency and should be followed up as soon as possible or go to the Emergency Department if any problems should occur.  Please show the CHEMOTHERAPY  ALERT CARD or IMMUNOTHERAPY ALERT CARD at check-in to the Emergency Department and triage nurse.  Should you have questions after your visit or need to cancel or reschedule your appointment, please contact [facility] - A DEPT OF [facility]  [phone] and follow the prompts.  Office hours are 8:00 a.m. to 4:30 p.m. Monday - Friday. Please note that voicemails left after 4:00 p.m. may not be returned until the following business day.  We are closed weekends and major holidays. You have access to a nurse at all times for urgent questions. Please call the main number to the clinic [phone] and follow the prompts.  For any non-urgent questions, you may also contact your provider using MyChart. We now offer e-Visits for anyone 75 and older to request care online for non-urgent symptoms. For details visit [email or web address].   Also download the MyChart app! Go to the app store, search "MyChart", open the app, select [place], and log in with your MyChart username and password.

## 2023-05-25 NOTE — Progress Notes (Signed)
 Nutrition Follow-up:  Patient with recurrent stage IV pancreatic cancer.  Patient on irnotecan liposome, fluorouracil.  Results of recent PET pending  Met with patient during infusion.  Reports that she continues to not have an appetite. Eats a few bites most days and does not feel like eating anymore.  Did not take the olanzapine because of fear of side effects.  Says that mouth pain is better, rinsing with baking soda and salt water. Nothing eaten so far today.  Yesterday ate 2 eggs and sausage patty and cup of pudding and jello.  Tries to drink carnation instant breakfast shakes 1-2 a day.  Reports some diarrhea that happens 2-3 times a week.  Takes imodium with relief.    Medications: reviewed  Labs: Na 132, K 3.4, glucose 123  Anthropometrics:   Weight 148 lb today 165 lb on 1/21 180 lb 3.2 oz on 9/24 181 lb 5.3 oz on 12/07/22  10% weight loss in the last month, significant 18% weight loss in the last 5 months, significant   NUTRITION DIAGNOSIS: Inadequate oral intake continues   MALNUTRITION DIAGNOSIS: Patient meets criteria for severe malnutrition in context of chronic illness with 18% weight loss in the last 5 months and eating less than 75% of estimated energy needs for > 1 month   INTERVENTION:  Encouraged eating by the clock, nibbling q 2 hours.   Continue oral nutrition supplements Concern about weight loss and lack of appetite and now severe malnutrition.  Message sent to MD. Await PET scan and lab results.      MONITORING, EVALUATION, GOAL: weight trends, intake   NEXT VISIT: Wednesday, March 12 during infusion  Rishaan Gunner B. Freida Busman, RD, LDN Registered Dietitian [phone]

## 2023-05-26 LAB — CANCER ANTIGEN 19-9: CA 19-9: 586 U/mL — ABNORMAL HIGH (ref 0–35)

## 2023-05-27 ENCOUNTER — Inpatient Hospital Stay: Payer: 59

## 2023-05-27 VITALS — BP 132/87 | HR 95 | Resp 18

## 2023-05-27 DIAGNOSIS — C250 Malignant neoplasm of head of pancreas: Secondary | ICD-10-CM

## 2023-05-27 DIAGNOSIS — Z5111 Encounter for antineoplastic chemotherapy: Secondary | ICD-10-CM | POA: Diagnosis not present

## 2023-05-27 MED ORDER — PEGFILGRASTIM-CBQV 6 MG/0.6ML ~~LOC~~ SOSY
6.0000 mg | PREFILLED_SYRINGE | Freq: Once | SUBCUTANEOUS | Status: AC
  Administered 2023-05-27: 6 mg via SUBCUTANEOUS
  Filled 2023-05-27: qty 0.6

## 2023-05-27 MED ORDER — SODIUM CHLORIDE 0.9% FLUSH
10.0000 mL | INTRAVENOUS | Status: DC | PRN
Start: 2023-05-27 — End: 2023-05-27
  Administered 2023-05-27: 10 mL
  Filled 2023-05-27: qty 10

## 2023-05-27 MED ORDER — HEPARIN SOD (PORK) LOCK FLUSH 100 UNIT/ML IV SOLN
500.0000 [IU] | Freq: Once | INTRAVENOUS | Status: AC | PRN
  Administered 2023-05-27: 500 [IU]
  Filled 2023-05-27: qty 5

## 2023-06-01 ENCOUNTER — Inpatient Hospital Stay: Payer: 59 | Attending: Internal Medicine | Admitting: Hospice and Palliative Medicine

## 2023-06-01 DIAGNOSIS — Z515 Encounter for palliative care: Secondary | ICD-10-CM

## 2023-06-01 DIAGNOSIS — C250 Malignant neoplasm of head of pancreas: Secondary | ICD-10-CM | POA: Insufficient documentation

## 2023-06-01 DIAGNOSIS — F32A Depression, unspecified: Secondary | ICD-10-CM | POA: Insufficient documentation

## 2023-06-01 DIAGNOSIS — Z90411 Acquired partial absence of pancreas: Secondary | ICD-10-CM | POA: Insufficient documentation

## 2023-06-01 DIAGNOSIS — F419 Anxiety disorder, unspecified: Secondary | ICD-10-CM | POA: Insufficient documentation

## 2023-06-01 DIAGNOSIS — E876 Hypokalemia: Secondary | ICD-10-CM | POA: Insufficient documentation

## 2023-06-01 DIAGNOSIS — G4700 Insomnia, unspecified: Secondary | ICD-10-CM | POA: Insufficient documentation

## 2023-06-01 DIAGNOSIS — M549 Dorsalgia, unspecified: Secondary | ICD-10-CM | POA: Insufficient documentation

## 2023-06-01 DIAGNOSIS — R000 Tachycardia, unspecified: Secondary | ICD-10-CM | POA: Insufficient documentation

## 2023-06-01 DIAGNOSIS — D509 Iron deficiency anemia, unspecified: Secondary | ICD-10-CM | POA: Insufficient documentation

## 2023-06-01 DIAGNOSIS — Z79899 Other long term (current) drug therapy: Secondary | ICD-10-CM | POA: Insufficient documentation

## 2023-06-01 DIAGNOSIS — I10 Essential (primary) hypertension: Secondary | ICD-10-CM | POA: Insufficient documentation

## 2023-06-01 NOTE — Progress Notes (Signed)
 Virtual Visit via Telephone Note  I connected with [name] on 06/01/23 at  2:20 PM EST by telephone and verified that I am speaking with the correct person using two identifiers.  Location: Patient: Home Provider: Clinic   I discussed the limitations, risks, security and privacy concerns of performing an evaluation and management service by telephone and the availability of in person appointments. I also discussed with the patient that there may be a patient responsible charge related to this service. The patient expressed understanding and agreed to proceed.   History of Present Illness: Robin Daniels is a 68 y.o. 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  Patient has had depression and anxiety.  She was referred to palliative care to address goals and manage ongoing symptoms.    Observations/Objective: I called and spoke with patient by phone.    Patient says she is doing reasonably well.  Denied any significant changes or concerns.  No symptomatic complaints today.  Patient denies significant pain.  She feels like her peripheral neuropathy is improved on duloxetine/gabapentin.  She feels like she is tolerating chemotherapy well.  She does endorse poor oral intake but is drinking nutritional supplements.  Has had sustained weight loss.  She is being followed by nutritionist.  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 The patient was provided an opportunity to ask questions and all were answered. The patient agreed with the plan and demonstrated an understanding of the instructions.   The patient was advised to call back or seek an in-person evaluation if the symptoms worsen or if  the condition fails to improve as anticipated.  I provided 10 minutes of non-face-to-face time during this encounter.   Malachy Moan, NP

## 2023-06-02 ENCOUNTER — Encounter: Payer: Self-pay | Admitting: Internal Medicine

## 2023-06-03 ENCOUNTER — Encounter: Payer: Self-pay | Admitting: Internal Medicine

## 2023-06-08 ENCOUNTER — Inpatient Hospital Stay: Payer: 59 | Admitting: Internal Medicine

## 2023-06-08 ENCOUNTER — Inpatient Hospital Stay: Payer: 59

## 2023-06-08 ENCOUNTER — Ambulatory Visit: Payer: 59

## 2023-06-08 ENCOUNTER — Inpatient Hospital Stay

## 2023-06-08 NOTE — Progress Notes (Signed)
 Nutrition  RD planning on seeing patient during infusion today but patient did not come to appointments due to not feeling well.   Roston Grunewald B. Freida Busman, RD, LDN Registered Dietitian [phone]

## 2023-06-10 ENCOUNTER — Inpatient Hospital Stay

## 2023-06-10 ENCOUNTER — Inpatient Hospital Stay
Admission: EM | Admit: 2023-06-10 | Discharge: 2023-06-17 | DRG: 640 | Disposition: A | Discharge status: Home Health | Attending: Hospitalist | Admitting: Hospitalist

## 2023-06-10 ENCOUNTER — Other Ambulatory Visit: Payer: Self-pay

## 2023-06-10 ENCOUNTER — Emergency Department

## 2023-06-10 ENCOUNTER — Inpatient Hospital Stay: Payer: 59

## 2023-06-10 DIAGNOSIS — Z515 Encounter for palliative care: Secondary | ICD-10-CM

## 2023-06-10 DIAGNOSIS — Z79899 Other long term (current) drug therapy: Secondary | ICD-10-CM

## 2023-06-10 DIAGNOSIS — E8721 Acute metabolic acidosis: Secondary | ICD-10-CM | POA: Diagnosis present

## 2023-06-10 DIAGNOSIS — N170 Acute kidney failure with tubular necrosis: Secondary | ICD-10-CM | POA: Diagnosis present

## 2023-06-10 DIAGNOSIS — F32A Depression, unspecified: Secondary | ICD-10-CM | POA: Diagnosis present

## 2023-06-10 DIAGNOSIS — Z96651 Presence of right artificial knee joint: Secondary | ICD-10-CM | POA: Diagnosis present

## 2023-06-10 DIAGNOSIS — A045 Campylobacter enteritis: Secondary | ICD-10-CM | POA: Diagnosis present

## 2023-06-10 DIAGNOSIS — Z8249 Family history of ischemic heart disease and other diseases of the circulatory system: Secondary | ICD-10-CM | POA: Diagnosis not present

## 2023-06-10 DIAGNOSIS — R7303 Prediabetes: Secondary | ICD-10-CM | POA: Diagnosis present

## 2023-06-10 DIAGNOSIS — K529 Noninfective gastroenteritis and colitis, unspecified: Secondary | ICD-10-CM

## 2023-06-10 DIAGNOSIS — N179 Acute kidney failure, unspecified: Secondary | ICD-10-CM | POA: Diagnosis not present

## 2023-06-10 DIAGNOSIS — R627 Adult failure to thrive: Secondary | ICD-10-CM | POA: Diagnosis present

## 2023-06-10 DIAGNOSIS — I10 Essential (primary) hypertension: Secondary | ICD-10-CM | POA: Diagnosis present

## 2023-06-10 DIAGNOSIS — E876 Hypokalemia: Secondary | ICD-10-CM | POA: Diagnosis present

## 2023-06-10 DIAGNOSIS — E860 Dehydration: Secondary | ICD-10-CM | POA: Diagnosis present

## 2023-06-10 DIAGNOSIS — C259 Malignant neoplasm of pancreas, unspecified: Secondary | ICD-10-CM

## 2023-06-10 DIAGNOSIS — D649 Anemia, unspecified: Secondary | ICD-10-CM | POA: Diagnosis present

## 2023-06-10 DIAGNOSIS — Z83438 Family history of other disorder of lipoprotein metabolism and other lipidemia: Secondary | ICD-10-CM | POA: Diagnosis not present

## 2023-06-10 DIAGNOSIS — Z7189 Other specified counseling: Secondary | ICD-10-CM

## 2023-06-10 DIAGNOSIS — E785 Hyperlipidemia, unspecified: Secondary | ICD-10-CM | POA: Diagnosis present

## 2023-06-10 DIAGNOSIS — Z8261 Family history of arthritis: Secondary | ICD-10-CM | POA: Diagnosis not present

## 2023-06-10 DIAGNOSIS — Z90411 Acquired partial absence of pancreas: Secondary | ICD-10-CM

## 2023-06-10 DIAGNOSIS — Z6825 Body mass index (BMI) 25.0-25.9, adult: Secondary | ICD-10-CM

## 2023-06-10 DIAGNOSIS — E871 Hypo-osmolality and hyponatremia: Secondary | ICD-10-CM | POA: Diagnosis present

## 2023-06-10 DIAGNOSIS — C250 Malignant neoplasm of head of pancreas: Secondary | ICD-10-CM | POA: Diagnosis present

## 2023-06-10 DIAGNOSIS — I4581 Long QT syndrome: Secondary | ICD-10-CM | POA: Diagnosis not present

## 2023-06-10 DIAGNOSIS — T451X5A Adverse effect of antineoplastic and immunosuppressive drugs, initial encounter: Secondary | ICD-10-CM | POA: Diagnosis present

## 2023-06-10 DIAGNOSIS — E8809 Other disorders of plasma-protein metabolism, not elsewhere classified: Secondary | ICD-10-CM | POA: Diagnosis present

## 2023-06-10 DIAGNOSIS — R5381 Other malaise: Secondary | ICD-10-CM | POA: Diagnosis present

## 2023-06-10 DIAGNOSIS — G629 Polyneuropathy, unspecified: Secondary | ICD-10-CM | POA: Diagnosis present

## 2023-06-10 DIAGNOSIS — Z803 Family history of malignant neoplasm of breast: Secondary | ICD-10-CM

## 2023-06-10 DIAGNOSIS — E872 Acidosis, unspecified: Secondary | ICD-10-CM

## 2023-06-10 DIAGNOSIS — R64 Cachexia: Secondary | ICD-10-CM | POA: Diagnosis present

## 2023-06-10 DIAGNOSIS — Z833 Family history of diabetes mellitus: Secondary | ICD-10-CM

## 2023-06-10 LAB — COMPREHENSIVE METABOLIC PANEL
ALT: 32 U/L (ref 0–44)
AST: 20 U/L (ref 15–41)
Albumin: 3.3 g/dL — ABNORMAL LOW (ref 3.5–5.0)
Alkaline Phosphatase: 127 U/L — ABNORMAL HIGH (ref 38–126)
Anion gap: 19 — ABNORMAL HIGH (ref 5–15)
BUN: 78 mg/dL — ABNORMAL HIGH (ref 8–23)
CO2: 10 mmol/L — ABNORMAL LOW (ref 22–32)
Calcium: 8.5 mg/dL — ABNORMAL LOW (ref 8.9–10.3)
Chloride: 97 mmol/L — ABNORMAL LOW (ref 98–111)
Creatinine, Ser: 5.01 mg/dL — ABNORMAL HIGH (ref 0.44–1.00)
GFR, Estimated: 9 mL/min — ABNORMAL LOW (ref >60)
Glucose, Bld: 183 mg/dL — ABNORMAL HIGH (ref 70–99)
Potassium: <2 mmol/L — CL (ref 3.5–5.1)
Sodium: 126 mmol/L — ABNORMAL LOW (ref 135–145)
Total Bilirubin: 0.6 mg/dL (ref 0.0–1.2)
Total Protein: 7.6 g/dL (ref 6.5–8.1)

## 2023-06-10 LAB — CBC WITH DIFFERENTIAL/PLATELET
Abs Immature Granulocytes: 0.22 10*3/uL — ABNORMAL HIGH (ref 0.00–0.07)
Basophils Absolute: 0.1 10*3/uL (ref 0.0–0.1)
Basophils Relative: 1 %
Eosinophils Absolute: 0.1 10*3/uL (ref 0.0–0.5)
Eosinophils Relative: 2 %
HCT: 29.7 % — ABNORMAL LOW (ref 36.0–46.0)
Hemoglobin: 10.1 g/dL — ABNORMAL LOW (ref 12.0–15.0)
Immature Granulocytes: 2 %
Lymphocytes Relative: 21 %
Lymphs Abs: 2 10*3/uL (ref 0.7–4.0)
MCH: 24.5 pg — ABNORMAL LOW (ref 26.0–34.0)
MCHC: 34 g/dL (ref 30.0–36.0)
MCV: 71.9 fL — ABNORMAL LOW (ref 80.0–100.0)
Monocytes Absolute: 0.7 10*3/uL (ref 0.1–1.0)
Monocytes Relative: 7 %
Neutro Abs: 6.4 10*3/uL (ref 1.7–7.7)
Neutrophils Relative %: 67 %
Platelets: 607 10*3/uL — ABNORMAL HIGH (ref 150–400)
RBC: 4.13 MIL/uL (ref 3.87–5.11)
RDW: 18 % — ABNORMAL HIGH (ref 11.5–15.5)
WBC: 9.5 10*3/uL (ref 4.0–10.5)
nRBC: 0 % (ref 0.0–0.2)

## 2023-06-10 LAB — LACTIC ACID, PLASMA
Lactic Acid, Venous: 1.7 mmol/L (ref 0.5–1.9)
Lactic Acid, Venous: 2.2 mmol/L (ref 0.5–1.9)
Lactic Acid, Venous: 1.5 mmol/L (ref 0.5–1.9)

## 2023-06-10 LAB — BLOOD GAS, VENOUS
Acid-base deficit: 16.4 mmol/L — ABNORMAL HIGH (ref 0.0–2.0)
Bicarbonate: 9.2 mmol/L — ABNORMAL LOW (ref 20.0–28.0)
O2 Saturation: 99.2 %
Patient temperature: 37
pCO2, Ven: 22 mmHg — ABNORMAL LOW (ref 44–60)
pH, Ven: 7.23 — ABNORMAL LOW (ref 7.25–7.43)
pO2, Ven: 187 mmHg — ABNORMAL HIGH (ref 32–45)

## 2023-06-10 LAB — BASIC METABOLIC PANEL
Anion gap: 15 (ref 5–15)
BUN: 78 mg/dL — ABNORMAL HIGH (ref 8–23)
CO2: 11 mmol/L — ABNORMAL LOW (ref 22–32)
Calcium: 7.9 mg/dL — ABNORMAL LOW (ref 8.9–10.3)
Chloride: 101 mmol/L (ref 98–111)
Creatinine, Ser: 4.37 mg/dL — ABNORMAL HIGH (ref 0.44–1.00)
GFR, Estimated: 10 mL/min — ABNORMAL LOW (ref >60)
Glucose, Bld: 138 mg/dL — ABNORMAL HIGH (ref 70–99)
Potassium: 2.3 mmol/L — CL (ref 3.5–5.1)
Sodium: 127 mmol/L — ABNORMAL LOW (ref 135–145)

## 2023-06-10 LAB — HIV ANTIBODY (ROUTINE TESTING W REFLEX): HIV Screen 4th Generation wRfx: NONREACTIVE

## 2023-06-10 LAB — PHOSPHORUS: Phosphorus: 7.4 mg/dL — ABNORMAL HIGH (ref 2.5–4.6)

## 2023-06-10 LAB — LIPASE, BLOOD: Lipase: 42 U/L (ref 11–51)

## 2023-06-10 LAB — SODIUM
Sodium: 129 mmol/L — ABNORMAL LOW (ref 135–145)
Sodium: 127 mmol/L — ABNORMAL LOW (ref 135–145)

## 2023-06-10 LAB — OSMOLALITY: Osmolality: 294 mosm/kg (ref 275–295)

## 2023-06-10 LAB — POTASSIUM: Potassium: 2.5 mmol/L — CL (ref 3.5–5.1)

## 2023-06-10 LAB — MAGNESIUM: Magnesium: 1.5 mg/dL — ABNORMAL LOW (ref 1.7–2.4)

## 2023-06-10 MED ORDER — POTASSIUM CHLORIDE CRYS ER 20 MEQ PO TBCR
40.0000 meq | EXTENDED_RELEASE_TABLET | Freq: Four times a day (QID) | ORAL | Status: DC
  Filled 2023-06-10: qty 2

## 2023-06-10 MED ORDER — POTASSIUM CHLORIDE 10 MEQ/100ML IV SOLN
10.0000 meq | Freq: Once | INTRAVENOUS | Status: AC
  Administered 2023-06-10: 10 meq via INTRAVENOUS
  Filled 2023-06-10: qty 100

## 2023-06-10 MED ORDER — MAGNESIUM OXIDE -MG SUPPLEMENT 400 (240 MG) MG PO TABS
400.0000 mg | ORAL_TABLET | Freq: Two times a day (BID) | ORAL | Status: DC
  Administered 2023-06-10: 400 mg via ORAL
  Filled 2023-06-10 (×2): qty 1

## 2023-06-10 MED ORDER — SODIUM CHLORIDE 0.9% FLUSH: 10.0000 mL | INTRAVENOUS | Status: DC | PRN

## 2023-06-10 MED ORDER — HEPARIN SODIUM (PORCINE) 5000 UNIT/ML IJ SOLN
5000.0000 [IU] | Freq: Three times a day (TID) | INTRAMUSCULAR | Status: DC
  Administered 2023-06-10 – 2023-06-17 (×21): 5000 [IU] via SUBCUTANEOUS
  Filled 2023-06-10 (×21): qty 1

## 2023-06-10 MED ORDER — POTASSIUM CHLORIDE 20 MEQ PO PACK
60.0000 meq | PACK | Freq: Once | ORAL | Status: AC
  Administered 2023-06-10: 60 meq via ORAL
  Filled 2023-06-10: qty 3

## 2023-06-10 MED ORDER — POTASSIUM CHLORIDE 10 MEQ/100ML IV SOLN
10.0000 meq | INTRAVENOUS | Status: AC
  Administered 2023-06-10 (×4): 10 meq via INTRAVENOUS
  Filled 2023-06-10 (×3): qty 100

## 2023-06-10 MED ORDER — SODIUM CHLORIDE 0.9% FLUSH
10.0000 mL | Freq: Two times a day (BID) | INTRAVENOUS | Status: DC
  Administered 2023-06-10 – 2023-06-13 (×6): 10 mL
  Administered 2023-06-13: 20 mL
  Administered 2023-06-14 – 2023-06-16 (×5): 10 mL

## 2023-06-10 MED ORDER — POTASSIUM CHLORIDE CRYS ER 20 MEQ PO TBCR
40.0000 meq | EXTENDED_RELEASE_TABLET | Freq: Once | ORAL | Status: AC
  Administered 2023-06-10: 40 meq via ORAL
  Filled 2023-06-10: qty 2

## 2023-06-10 MED ORDER — POTASSIUM CHLORIDE 10 MEQ/100ML IV SOLN
10.0000 meq | INTRAVENOUS | Status: AC
Start: 2023-06-10 — End: 2023-06-11
  Administered 2023-06-10 – 2023-06-11 (×4): 10 meq via INTRAVENOUS
  Filled 2023-06-10 (×4): qty 100

## 2023-06-10 MED ORDER — ONDANSETRON HCL 4 MG/2ML IJ SOLN
4.0000 mg | Freq: Four times a day (QID) | INTRAMUSCULAR | Status: DC | PRN
  Administered 2023-06-10 – 2023-06-12 (×3): 4 mg via INTRAVENOUS
  Filled 2023-06-10 (×3): qty 2

## 2023-06-10 MED ORDER — POTASSIUM CHLORIDE 20 MEQ PO PACK
40.0000 meq | PACK | Freq: Once | ORAL | Status: AC
  Administered 2023-06-10: 40 meq via ORAL
  Filled 2023-06-10: qty 2

## 2023-06-10 MED ORDER — ONDANSETRON HCL 4 MG PO TABS: 4.0000 mg | ORAL_TABLET | Freq: Four times a day (QID) | ORAL | Status: DC | PRN

## 2023-06-10 MED ORDER — SODIUM BICARBONATE 8.4 % IV SOLN
INTRAVENOUS | Status: DC
  Filled 2023-06-10: qty 1000
  Filled 2023-06-10 (×3): qty 150

## 2023-06-10 MED ORDER — SODIUM CHLORIDE 0.9 % IV BOLUS
1000.0000 mL | Freq: Once | INTRAVENOUS | Status: AC
  Administered 2023-06-10: 1000 mL via INTRAVENOUS

## 2023-06-10 MED ORDER — CHLORHEXIDINE GLUCONATE CLOTH 2 % EX PADS
6.0000 | MEDICATED_PAD | Freq: Every day | CUTANEOUS | Status: DC
  Administered 2023-06-10 – 2023-06-16 (×7): 6 via TOPICAL

## 2023-06-10 NOTE — ED Triage Notes (Signed)
 Pt arrives via EMS from home due to failure to thrive for the last two weeks. Per pt she has been nauseated and vomiting for the last two weeks. Pt sts that she has also not went to chemo for the last two weeks either as she has been having weakness and wanting to go due to feeling weak.

## 2023-06-10 NOTE — Consult Note (Signed)
 [facility] Consult Note:06/10/23     Date of Admission:  06/10/2023           Reason for Consult:     Referring Provider: Floydene Flock, MD Primary Care Provider: Louis Matte, MD   History of Presenting Illness:  Robin Daniels is a 68 y.o. female With medical problems of stage IV pancreatic cancer, hypertension, hyperlipidemia.  She presents to the emergency room for generalized weakness noted over the past 2 weeks.  She had chemotherapy in late February but missed few treatments.  She reports poor appetite.  In the emergency room she was noted to have critically elevated creatinine of 5.01.  Her baseline creatinine is 1.0 from May 25, 2023.  In addition potassium is critically low at less than 2.0 and sodium is low at 126.  She also has acute metabolic acidosis with CO2 of 10.  Albumin is low at 3.3.  She is also anemic with hemoglobin of 10.1. Urinalysis is not available. She underwent CT abdomen pelvis without contrast.  Results are pending.  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  She was diagnosed with stage IV metastatic disease in August 2024.  Patient on Nalley rechemotherapy.  Cycle #5 given in February 2025.  No significant improvement in tumor markers have been noted.  She also has oral mucositis which is being treated with baking soda and Magic mouthwash.  Patient is seen at bedside.  She states she is able to eat a little bit.  She is currently getting IV Sodium bicarbonate .  She is currently on room air.  Review of Systems: Review of Systems  Constitutional:  Positive for malaise/fatigue. Negative for chills and fever.  HENT:  Negative for hearing loss.   Eyes:  Negative for blurred vision and double vision.  Respiratory:  Negative for cough, hemoptysis and shortness of breath.   Cardiovascular:   Positive for chest pain. Negative for leg swelling.  Gastrointestinal:  Negative for abdominal pain, blood in stool, diarrhea, heartburn, nausea and vomiting.  Genitourinary:  Negative for dysuria, frequency and urgency.  Musculoskeletal:  Negative for back pain, myalgias and neck pain.  Skin:  Negative for rash.  Neurological:  Negative for dizziness.  Endo/Heme/Allergies:  Does not bruise/bleed easily.    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/04/2019   Complication of anesthesia    Fluttering heart    Hyperlipidemia    Hypertension    PONV (postoperative nausea and vomiting)    Pre-diabetes    Vertigo    none recently    Social History   Tobacco Use   Smoking status: Never   Smokeless tobacco: Never  Vaping Use   Vaping status: Never Used  Substance Use Topics   Alcohol use: No    Alcohol/week: 0.0 standard drinks of alcohol   Drug use: No    Family History  Problem Relation Age of Onset   Diabetes Mother    Hyperlipidemia Mother    Hypertension Mother    Vision loss Mother    Arthritis Father    Hyperlipidemia Father    Hypertension Father    Breast cancer Maternal Aunt      OBJECTIVE: Blood pressure 108/72, pulse (!) 102, temperature 98 F (36.7 C),  temperature source Oral, resp. rate (!) 22, height 5\' 4"  (1.626 m), weight 67 kg, SpO2 96%.  Physical Exam  General Appearance-thin,  HEENT-moist oral mucous membranes Pulmonary-normal breathing effort on room air Cardiac-regular, no rub Abdomen-soft, nontender Extremities-no peripheral edema Neuro-alert and oriented   Lab Results Lab Results  Component Value Date   WBC 9.5 06/10/2023   HGB 10.1 (L) 06/10/2023   HCT 29.7 (L) 06/10/2023   MCV 71.9 (L) 06/10/2023   PLT 607 (H) 06/10/2023    Lab Results  Component Value Date   CREATININE 5.01 (H) 06/10/2023   BUN 78 (H) 06/10/2023   NA 126 (L)  06/10/2023   K <2.0 (LL) 06/10/2023   CL 97 (L) 06/10/2023   CO2 10 (L) 06/10/2023    Lab Results  Component Value Date   ALT 32 06/10/2023   AST 20 06/10/2023   ALKPHOS 127 (H) 06/10/2023   BILITOT 0.6 06/10/2023     Microbiology: No results found for this or any previous visit (from the past 240 hours).  Medications: Scheduled Meds:  heparin  5,000 Units Subcutaneous Q8H   Continuous Infusions:  sodium bicarbonate 150 mEq in dextrose 5 % 1,150 mL infusion     PRN Meds:.ondansetron **OR** ondansetron (ZOFRAN) IV  No Known Allergies  Urinalysis: No results for input(s): "COLORURINE", "LABSPEC", "PHURINE", "GLUCOSEU", "HGBUR", "BILIRUBINUR", "KETONESUR", "PROTEINUR", "UROBILINOGEN", "NITRITE", "LEUKOCYTESUR" in the last 72 hours.  Invalid input(s): "APPERANCEUR"    Imaging: DG Chest 2 View Result Date: 06/10/2023 CLINICAL DATA:  Lethargy, failure to thrive. EXAM: CHEST - 2 VIEW COMPARISON:  July 15, 2020.  May 24, 2023. FINDINGS: The heart size and mediastinal contours are within normal limits. 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 No definite consolidative process is noted. Right internal jugular Port-A-Cath is unchanged. The visualized skeletal structures are unremarkable. IMPRESSION: No active cardiopulmonary disease. Electronically Signed   By: Lupita Raider M.D.   On: 06/10/2023 11:03      Assessment/Plan:  Robin Daniels is a 68 y.o. 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, depression    was admitted on 06/10/2023 for :  AKI (acute kidney injury) (HCC) [N17.9]  1.  Acute kidney injury 2.  Severe hypokalemia 3.  Metastatic pancreatic cancer 4.  Hyponatremia 5.  Acute metabolic acidosis 6.  Hypomagnesemia 7.  Hyperphosphatemia 8.  Hypoalbuminemia 9.  Anemia  Acute kidney injury likely secondary to Severe ATN. Currently patient is oliguric but hopefully  with IV fluid administration, her urine output will improve.  Plan: Agree with IV hydration.  Patient is currently getting IV sodium bicarbonate infusion to correct acute metabolic acidosis. Agree with IV potassium replacement.  Patient will need multiple rounds.  She may need up to 100 -150 mEq per day. Will start oral magnesium replacement. Encourage oral intake. Additional supplements such as Ensure/boost if tolerated.   Keiji Melland Thedore Mins 06/10/23

## 2023-06-10 NOTE — Progress Notes (Signed)
   06/10/23 1300  Spiritual Encounters  Type of Visit Initial  Care provided to: Pt and family  Conversation partners present during encounter Nurse  Reason for visit Routine spiritual support  OnCall Visit No   On Call Chaplain let Unit Chaplain know a page came in because the family was tearful about patient's health.  Chaplain offered a compassionate presence, reflective listening and prayer.  Rev. Rana M. Earlene Plater, M.Div. Chaplain [facility]

## 2023-06-10 NOTE — Assessment & Plan Note (Addendum)
 Baseline stage IV pancreatic cancer followed by Dr. Guy Sandifer outpatient On chemotherapy Noted concurrent failure to thrive in the setting of acute kidney injury today Prior hospice evaluation outpatient Will also consult palliative care

## 2023-06-10 NOTE — ED Provider Notes (Signed)
 [facility] Provider Note    Event Date/Time   First MD Initiated Contact with Patient 06/10/23 [phone]     (approximate)   History   Failure To Thrive   HPI  Robin Daniels is a 68 y.o. female with history of stage IV pancreatic cancer on chemotherapy, depression, and anxiety who presents with generalized weakness, worsening over the last 2 weeks.  The patient last had chemotherapy in late February and states that she has missed her subsequent chemotherapy because she felt too weak to go in for it.  She denies any nausea or vomiting but states that she has no appetite.  She has no acute pain.  She denies any fever or chills.  Per the daughter, the patient has not been altered or confused at all.  The main issue is worsening generalized weakness.  I reviewed the past medical records.  The patient's most recent outpatient encounter was on 3/5 with palliative care.  Her most recent oncology follow-up visit was on 2/26 and her last chemotherapy was at that time.   Physical Exam   Triage Vital Signs: ED Triage Vitals  Encounter Vitals Group     BP      Systolic BP Percentile      Diastolic BP Percentile      Pulse      Resp      Temp      Temp src      SpO2      Weight      Height      Head Circumference      Peak Flow      Pain Score      Pain Loc      Pain Education      Exclude from Growth Chart     Most recent vital signs: Vitals:   06/10/23 1000 06/10/23 1030  BP: 110/82 120/73  Pulse: 98 97  Resp: 17 16  Temp:    SpO2: 96% 98%     General: Alert, comfortable appearing, no distress.  CV:  Good peripheral perfusion.  Resp:  Normal effort.  Lungs CTAB. Abd:  Soft and nontender.  No distention.  Other:  Somewhat dry mucous membranes.  No jaundice or scleral icterus.  Normal speech.  Motor intact in all extremities.   ED Results / Procedures / Treatments   Labs (all labs ordered are listed, but only abnormal results are  displayed) Labs Reviewed  COMPREHENSIVE METABOLIC PANEL - Abnormal; Notable for the following components:      Result Value   Sodium 126 (*)    Potassium <2.0 (*)    Chloride 97 (*)    CO2 10 (*)    Glucose, Bld 183 (*)    BUN 78 (*)    Creatinine, Ser 5.01 (*)    Calcium 8.5 (*)    Albumin 3.3 (*)    Alkaline Phosphatase 127 (*)    GFR, Estimated 9 (*)    Anion gap 19 (*)    All other components within normal limits  CBC WITH DIFFERENTIAL/PLATELET - Abnormal; Notable for the following components:   Hemoglobin 10.1 (*)    HCT 29.7 (*)    MCV 71.9 (*)    MCH 24.5 (*)    RDW 18.0 (*)    Platelets 607 (*)    Abs Immature Granulocytes 0.22 (*)    All other components within normal limits  MAGNESIUM - Abnormal; Notable for the following components:  Magnesium 1.5 (*)    All other components within normal limits  PHOSPHORUS - Abnormal; Notable for the following components:   Phosphorus 7.4 (*)    All other components within normal limits  LACTIC ACID, PLASMA  LIPASE, BLOOD  LACTIC ACID, PLASMA  URINALYSIS, W/ REFLEX TO CULTURE (INFECTION SUSPECTED)     EKG  ED ECG REPORT I, Dionne Bucy, the attending physician, personally viewed and interpreted this ECG.  Date: 06/10/2023 EKG Time: 0907 Rate: 108 Rhythm: normal sinus rhythm QRS Axis: normal Intervals: Prolonged QTc ST/T Wave abnormalities: LVH with repolarization abnormality Narrative Interpretation: no evidence of acute ischemia    RADIOLOGY  Chest x-ray: I independently viewed and interpreted the images; there is no focal consolidation or edema   PROCEDURES:  Critical Care performed: No  Procedures   MEDICATIONS ORDERED IN ED: Medications  sodium chloride 0.9 % bolus 1,000 mL (0 mLs Intravenous Stopped 06/10/23 1130)  potassium chloride 10 mEq in 100 mL IVPB (0 mEq Intravenous Stopped 06/10/23 1221)  potassium chloride 10 mEq in 100 mL IVPB (0 mEq Intravenous Stopped 06/10/23 1221)  potassium  chloride (KLOR-CON) packet 40 mEq (40 mEq Oral Given 06/10/23 1037)     IMPRESSION / MDM / ASSESSMENT AND PLAN / ED COURSE  I reviewed the triage vital signs and the nursing notes.  68 year old female with PMH as noted above, currently undergoing treatment for stage IV pancreatic cancer, presents with worsening generalized weakness and decreased appetite.  On exam she is tachycardic.  Other vital signs are normal.  She is alert and oriented with a nonfocal neurologic exam.  EKG shows prolonged QT.  Differential diagnosis includes, but is not limited to, dehydration, electrolyte abnormality, AKI, other metabolic cause, chemotherapy side effects, worsening weakness directly related to the cancer, less likely UTI or other infection.  We will give fluids, obtain lab workup, and reassess.  Patient's presentation is most consistent with acute presentation with potential threat to life or bodily function.  The patient is on the cardiac monitor to evaluate for evidence of arrhythmia and/or significant heart rate changes.  ----------------------------------------- 12:34 PM on 06/10/2023 -----------------------------------------  Lab workup revealed severe AKI with a creatinine of 5.  Potassium is also undetectable.  I have ordered IV and p.o. potassium repletion.  CBC shows mild anemia.  Lactate is normal.  I talked to the patient and family and they have not set any advanced directives.  The patient is full code at this time.  She will need admission for further management.  I consulted Dr. Alvester Morin from the hospitalist service; based on our discussion he agrees to evaluate the patient for admission.   FINAL CLINICAL IMPRESSION(S) / ED DIAGNOSES   Final diagnoses:  Acute renal failure, unspecified acute renal failure type (HCC)  Hypokalemia     Rx / DC Orders   ED Discharge Orders     None        Note:  This document was prepared using Dragon voice recognition software and may  include unintentional dictation errors.    Dionne Bucy, MD 06/10/23 1235

## 2023-06-10 NOTE — Progress Notes (Signed)
 Approximately 1700--Pt arrived to room 221B from ED. Upon arrival, pt A&O x 4 and VS obtained. Pt oriented to room and unit. All fall precautions in place. Pt accompanied by her grandson, at bedside. Telemetry connected and notified.

## 2023-06-10 NOTE — Assessment & Plan Note (Signed)
 K of less than 2 Mag level 1.5 Replete Follow

## 2023-06-10 NOTE — Assessment & Plan Note (Signed)
 BP stable Titrate home regimen

## 2023-06-10 NOTE — Assessment & Plan Note (Addendum)
 Metabolic Acidosis Severe AKI with noted creatinine going from 1-5 with GFR of less than 10 on presentation Markedly dry Noted baseline stage IV pancreatic cancer as well as active chemotherapy are confounding issues Bicarb 10 VBG pending CT abdomen pelvis without contrast pending to better assess pathology Will start on bicarb drip Hold nephrotoxic agents Dr. Thedore Mins with nephrology consulted Broached the issue of potential dialysis with family if symptoms do not improve Family also agreeable to palliative care evaluation

## 2023-06-10 NOTE — Assessment & Plan Note (Signed)
 Continue statin.

## 2023-06-10 NOTE — H&P (Addendum)
 History and Physical    Patient: Robin Daniels ZOX:[id number] DOB: 1956-02-18 DOA: 06/10/2023 DOS: the patient was seen and examined on 06/10/2023 PCP: Sherrin Daisy Danton Clap, MD  Patient coming from: Home  Chief Complaint:  Chief Complaint  Patient presents with   Failure To Thrive   HPI: Robin Daniels is a 68 y.o. female with medical history significant of stage IV pancreatic cancer, hypertension, hyperlipidemia presenting with with failure to thrive, AKI.  Patient and family bedside report roughly 2 weeks of decreased p.o. intake, mild nausea.  No abdominal pain.  No diarrhea.  No fevers or chills.  Noted on active chemotherapy in the setting of stage IV pancreatic cancer.  Followed by Dr. Donneta Romberg outpatient.  Has not had chemotherapy in the past week secondary to weakness.  No chest pain or shortness of breath.  No reported NSAID use. Presented to the ER afebrile, hemodynamically stable.  Satting well on room air.  White count 9.5, hemoglobin 10.1, platelets 607, magnesium 1.5, creatinine 5, bicarb 10, potassium less than 2.  Sodium 126.  Lactate 1.7. Review of Systems: As mentioned in the history of present illness. All other systems reviewed and are negative. 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/04/2019   Complication of anesthesia    Fluttering heart    Hyperlipidemia    Hypertension    PONV (postoperative nausea and vomiting)    Pre-diabetes    Vertigo    none recently   Past Surgical History:  Procedure Laterality Date   CATARACT EXTRACTION W/PHACO Right 06/09/2021   Procedure: CATARACT EXTRACTION PHACO AND INTRAOCULAR LENS PLACEMENT (IOC) RIGHT malyugin;  Surgeon: Galen Manila, MD;  Location: [facility];  Service: Ophthalmology;  Laterality: Right;  12.40 1:07.0   COLONOSCOPY WITH PROPOFOL N/A 01/13/2021   Procedure: COLONOSCOPY WITH PROPOFOL;  Surgeon: Wyline Mood, MD;  Location: [facility] END[facility];  Service: Gastroenterology;  Laterality: N/A;   ENDOSCOPIC RETROGRADE CHOLANGIOPANCREATOGRAPHY (ERCP) WITH PROPOFOL N/A 11/16/2019   Procedure: ENDOSCOPIC RETROGRADE CHOLANGIOPANCREATOGRAPHY (ERCP) WITH PROPOFOL;  Surgeon: Midge Minium, MD;  Location: [facility] ENDOSCOPY;  Service: Endoscopy;  Laterality: N/A;   ERCP N/A 03/27/2020   Procedure: ENDOSCOPIC RETROGRADE CHOLANGIOPANCREATOGRAPHY (ERCP);  Surgeon: Midge Minium, MD;  Location: [facility] ENDOSCOPY;  Service: Endoscopy;  Laterality: N/A;   EUS N/A 11/29/2019   Procedure: FULL UPPER ENDOSCOPIC ULTRASOUND (EUS) RADIAL;  Surgeon: Doren Custard, MD;  Location: [facility] ENDOSCOPY;  Service: Gastroenterology;  Laterality: N/A;   IR FLUORO RM 30-60 MIN  07/30/2020   IR IMAGING GUIDED PORT INSERTION  12/14/2019   JEJUNOSTOMY Left 07/15/2020   Procedure: Rance Muir;  Surgeon: Almond Lint, MD;  Location: [facility] OR;  Service: General;  Laterality: Left;   LAPAROSCOPY N/A 07/15/2020   Procedure: DIAGNOSTIC LAPAROSCOPY;  Surgeon: Almond Lint, MD;  Location: [facility] OR;  Service: General;  Laterality: N/A;   right knee replacement Right [id number]   SHOULDER ARTHROSCOPY W/ ROTATOR CUFF REPAIR Right    WHIPPLE PROCEDURE N/A 07/15/2020   Procedure: WHIPPLE PROCEDURE;  Surgeon: Almond Lint, MD;  Location: [facility] OR;  Service: General;  Laterality: N/A;   Social History:  reports that she has never smoked. She has never used smokeless tobacco. She reports that she does not drink alcohol and does not use drugs.  No Known Allergies  Family History  Problem Relation Age of Onset   Diabetes Mother  Hyperlipidemia Mother    Hypertension Mother    Vision loss Mother    Arthritis Father    Hyperlipidemia Father    Hypertension Father    Breast cancer Maternal Aunt     Prior to Admission medications   Medication Sig Start Date End Date Taking? Authorizing Provider  acetaminophen (TYLENOL) 325 MG tablet Take 650 mg by mouth every 6 (six)  hours as needed for moderate pain.    [provider]  amLODipine (NORVASC) 10 MG tablet Take 1 tablet (10 mg total) by mouth daily. 05/27/20   Earna Coder, MD  Calcium Carbonate-Vit D-Min (CALCIUM 600+D PLUS MINERALS PO) Take 1 tablet by mouth in the morning and at bedtime.    [provider]  diphenoxylate-atropine (LOMOTIL) 2.5-0.025 MG tablet Take 1 tablet by mouth 4 (four) times daily as needed for diarrhea or loose stools. Take it along with immodium 02/22/23   Earna Coder, MD  DULoxetine (CYMBALTA) 30 MG capsule Take 1 capsule (30 mg total) by mouth daily. 11/30/22   Borders, Daryl Eastern, NP  Ferrous Bisglycinate Chelate 28 MG CAPS Take 1 tablet by mouth daily. 02/01/23   Alinda Dooms, NP  gabapentin (NEURONTIN) 300 MG capsule Take 300 mg by mouth daily. 11/04/21   [provider]  lidocaine-prilocaine (EMLA) cream Apply on the port. 30 -45 min  prior to port access. 11/23/22   Earna Coder, MD  lisinopril (ZESTRIL) 10 MG tablet Take 10 mg by mouth daily.    [provider]  magic mouthwash (multi-ingredient) oral suspension Swish and swallow 5-10 mLs by mouth 4 (four) times daily as needed. 05/11/23   Earna Coder, MD  metoprolol succinate (TOPROL-XL) 50 MG 24 hr tablet Take 1 tablet (50 mg total) by mouth daily. Take with or immediately following a meal. 05/27/20   Earna Coder, MD  OLANZapine (ZYPREXA) 5 MG tablet Take one pill at night. 05/11/23   Earna Coder, MD  ondansetron (ZOFRAN) 8 MG tablet One pill every 8 hours as needed for nausea/vomitting. 11/23/22   Earna Coder, MD  potassium & sodium phosphates (PHOS-NAK) 280-160-250 MG PACK Take 1 packet by mouth 4 (four) times daily -  before meals and at bedtime.    [provider]  prochlorperazine (COMPAZINE) 10 MG tablet Take 1 tablet (10 mg total) by mouth every 6 (six) hours as needed for nausea or vomiting. 11/23/22   Earna Coder,  MD  traMADol (ULTRAM) 50 MG tablet Take 1 tablet (50 mg total) by mouth every 8 (eight) hours as needed. 03/14/23   Earna Coder, MD  vitamin B-12 (CYANOCOBALAMIN) 1000 MCG tablet Take 1 tablet (1,000 mcg total) by mouth daily. 11/17/19   Lurene Shadow, MD    Physical Exam: Vitals:   06/10/23 1200 06/10/23 1230 06/10/23 1251 06/10/23 1300  BP: 90/66 103/66  108/72  Pulse: 97 100  (!) 102  Resp: 19 20  (!) 22  Temp:   98 F (36.7 C)   TempSrc:   Oral   SpO2: 97% 99%  96%  Weight:      Height:       Physical Exam Constitutional:      Comments: Underweight, cachectic  HENT:     Head: Normocephalic.     Nose: Nose normal.     Mouth/Throat:     Mouth: Mucous membranes are dry.  Eyes:     Pupils: Pupils are equal, round, and reactive to light.  Cardiovascular:     Rate and Rhythm: Normal rate and regular rhythm.  Pulmonary:     Effort: Pulmonary effort is normal.  Abdominal:     General: Bowel sounds are normal.  Musculoskeletal:     Comments: Positive generalized weakness  Skin:    General: Skin is dry.  Neurological:     General: No focal deficit present.  Psychiatric:        Mood and Affect: Mood normal.     Data Reviewed:  There are no new results to review at this time.  DG Chest 2 View CLINICAL DATA:  Lethargy, failure to thrive.  EXAM: CHEST - 2 VIEW  COMPARISON:  July 15, 2020.  May 24, 2023.  FINDINGS: The heart size and mediastinal contours are within normal limits. 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 No definite consolidative process is noted. Right internal jugular Port-A-Cath is unchanged. The visualized skeletal structures are unremarkable.  IMPRESSION: No active cardiopulmonary disease.  Electronically Signed   By: Lupita Raider M.D.   On: 06/10/2023 11:03  Lab Results  Component Value Date   WBC 9.5 06/10/2023   HGB 10.1 (L) 06/10/2023   HCT 29.7 (L)  06/10/2023   MCV 71.9 (L) 06/10/2023   PLT 607 (H) 06/10/2023   Last metabolic panel Lab Results  Component Value Date   GLUCOSE 183 (H) 06/10/2023   NA 126 (L) 06/10/2023   K <2.0 (LL) 06/10/2023   CL 97 (L) 06/10/2023   CO2 10 (L) 06/10/2023   BUN 78 (H) 06/10/2023   CREATININE 5.01 (H) 06/10/2023   GFRNONAA 9 (L) 06/10/2023   CALCIUM 8.5 (L) 06/10/2023   PHOS 7.4 (H) 06/10/2023   PROT 7.6 06/10/2023   ALBUMIN 3.3 (L) 06/10/2023   BILITOT 0.6 06/10/2023   ALKPHOS 127 (H) 06/10/2023   AST 20 06/10/2023   ALT 32 06/10/2023   ANIONGAP 19 (H) 06/10/2023    Assessment and Plan: * AKI (acute kidney injury) (HCC) Metabolic Acidosis Severe AKI with noted creatinine going from 1-5 with GFR of less than 10 on presentation Markedly dry Noted baseline stage IV pancreatic cancer as well as active chemotherapy are confounding issues Bicarb 10 VBG pending CT abdomen pelvis without contrast pending to better assess pathology Will start on bicarb drip Hold nephrotoxic agents Dr. Thedore Mins with nephrology consulted Broached the issue of potential dialysis with family if symptoms do not improve Family also agreeable to palliative care evaluation  Hyponatremia Sodium 126 on presentation Markedly dry in the setting of severe AKI Sodium bicarb in the setting of concomitant metabolic acidosis Trend sodium Goal 6-8 mill equivalent change over the next 12 to 24 hours Urine and serum studies Monitor  Cancer of head of pancreas (HCC) Baseline stage IV pancreatic cancer followed by Dr. Virl Son outpatient On chemotherapy Noted concurrent failure to thrive in the setting of acute kidney injury today Prior hospice evaluation outpatient Will also consult palliative care   Hypokalemia K of less than 2 Mag level 1.5 Replete Follow   Hypertension goal BP (blood pressure) < 140/90 BP stable Titrate home regimen  HLD (hyperlipidemia) Continue statin      Advance Care Planning:    Code Status: Full Code   Consults: Nephrology  Family Communication: Family at the bedside   Severity of Illness: The appropriate patient status for this patient is INPATIENT. Inpatient status is judged to be reasonable and necessary in order to provide the required  intensity of service to ensure the patient's safety. The patient's presenting symptoms, physical exam findings, and initial radiographic and laboratory data in the context of their chronic comorbidities is felt to place them at high risk for further clinical deterioration. Furthermore, it is not anticipated that the patient will be medically stable for discharge from the hospital within 2 midnights of admission.   * I certify that at the point of admission it is my clinical judgment that the patient will require inpatient hospital care spanning beyond 2 midnights from the point of admission due to high intensity of service, high risk for further deterioration and high frequency of surveillance required.*  Author: Floydene Flock, MD 06/10/2023 1:37 PM  For on call review [email or web address].

## 2023-06-10 NOTE — Assessment & Plan Note (Signed)
 Sodium 126 on presentation Markedly dry in the setting of severe AKI Sodium bicarb in the setting of concomitant metabolic acidosis Trend sodium Goal 6-8 mill equivalent change over the next 12 to 24 hours Urine and serum studies Monitor

## 2023-06-11 DIAGNOSIS — E876 Hypokalemia: Secondary | ICD-10-CM

## 2023-06-11 DIAGNOSIS — I4581 Long QT syndrome: Secondary | ICD-10-CM

## 2023-06-11 DIAGNOSIS — N179 Acute kidney failure, unspecified: Secondary | ICD-10-CM | POA: Diagnosis not present

## 2023-06-11 LAB — COMPREHENSIVE METABOLIC PANEL
ALT: 22 U/L (ref 0–44)
AST: 21 U/L (ref 15–41)
Albumin: 2.7 g/dL — ABNORMAL LOW (ref 3.5–5.0)
Alkaline Phosphatase: 93 U/L (ref 38–126)
Anion gap: 11 (ref 5–15)
BUN: 67 mg/dL — ABNORMAL HIGH (ref 8–23)
CO2: 17 mmol/L — ABNORMAL LOW (ref 22–32)
Calcium: 7.6 mg/dL — ABNORMAL LOW (ref 8.9–10.3)
Chloride: 99 mmol/L (ref 98–111)
Creatinine, Ser: 3.71 mg/dL — ABNORMAL HIGH (ref 0.44–1.00)
GFR, Estimated: 13 mL/min — ABNORMAL LOW (ref >60)
Glucose, Bld: 146 mg/dL — ABNORMAL HIGH (ref 70–99)
Potassium: 2.2 mmol/L — CL (ref 3.5–5.1)
Sodium: 127 mmol/L — ABNORMAL LOW (ref 135–145)
Total Bilirubin: 0.6 mg/dL (ref 0.0–1.2)
Total Protein: 6 g/dL — ABNORMAL LOW (ref 6.5–8.1)

## 2023-06-11 LAB — FOLATE: Folate: >40 ng/mL (ref >5.9)

## 2023-06-11 LAB — BLOOD GAS, VENOUS: Patient temperature: 37 *Deleted

## 2023-06-11 LAB — URINALYSIS, W/ REFLEX TO CULTURE (INFECTION SUSPECTED)
Bilirubin Urine: NEGATIVE
Glucose, UA: NEGATIVE mg/dL
Hgb urine dipstick: NEGATIVE
Ketones, ur: NEGATIVE mg/dL
Leukocytes,Ua: NEGATIVE
Nitrite: NEGATIVE
Protein, ur: 30 mg/dL — AB
Specific Gravity, Urine: 1.012 (ref 1.005–1.030)
pH: 5 (ref 5.0–8.0)

## 2023-06-11 LAB — CALCIUM: Calcium: 7.8 mg/dL — ABNORMAL LOW (ref 8.9–10.3)

## 2023-06-11 LAB — SODIUM: Sodium: 125 mmol/L — ABNORMAL LOW (ref 135–145)

## 2023-06-11 LAB — CBC
HCT: 22.2 % — ABNORMAL LOW (ref 36.0–46.0)
Hemoglobin: 8 g/dL — ABNORMAL LOW (ref 12.0–15.0)
MCH: 25.6 pg — ABNORMAL LOW (ref 26.0–34.0)
MCHC: 36 g/dL (ref 30.0–36.0)
MCV: 70.9 fL — ABNORMAL LOW (ref 80.0–100.0)
Platelets: 380 10*3/uL (ref 150–400)
RBC: 3.13 MIL/uL — ABNORMAL LOW (ref 3.87–5.11)
RDW: 18 % — ABNORMAL HIGH (ref 11.5–15.5)
WBC: 10.4 10*3/uL (ref 4.0–10.5)
nRBC: 0 % (ref 0.0–0.2)

## 2023-06-11 LAB — IRON AND TIBC
Iron: 93 ug/dL (ref 28–170)
Saturation Ratios: 37 % — ABNORMAL HIGH (ref 10.4–31.8)
TIBC: 255 ug/dL (ref 250–450)
UIBC: 162 ug/dL

## 2023-06-11 LAB — BASIC METABOLIC PANEL
Anion gap: 14 (ref 5–15)
BUN: 62 mg/dL — ABNORMAL HIGH (ref 8–23)
CO2: 20 mmol/L — ABNORMAL LOW (ref 22–32)
Calcium: 7.8 mg/dL — ABNORMAL LOW (ref 8.9–10.3)
Chloride: 92 mmol/L — ABNORMAL LOW (ref 98–111)
Creatinine, Ser: 3.09 mg/dL — ABNORMAL HIGH (ref 0.44–1.00)
GFR, Estimated: 16 mL/min — ABNORMAL LOW (ref >60)
Glucose, Bld: 175 mg/dL — ABNORMAL HIGH (ref 70–99)
Potassium: <2 mmol/L — CL (ref 3.5–5.1)
Sodium: 126 mmol/L — ABNORMAL LOW (ref 135–145)

## 2023-06-11 LAB — FERRITIN: Ferritin: 624 ng/mL — ABNORMAL HIGH (ref 11–307)

## 2023-06-11 LAB — CREATININE, URINE, RANDOM: Creatinine, Urine: 200 mg/dL

## 2023-06-11 LAB — MAGNESIUM
Magnesium: 1.3 mg/dL — ABNORMAL LOW (ref 1.7–2.4)
Magnesium: 2.8 mg/dL — ABNORMAL HIGH (ref 1.7–2.4)

## 2023-06-11 LAB — ALBUMIN: Albumin: 2.7 g/dL — ABNORMAL LOW (ref 3.5–5.0)

## 2023-06-11 LAB — OSMOLALITY, URINE: Osmolality, Ur: 311 mosm/kg (ref 300–900)

## 2023-06-11 LAB — SODIUM, URINE, RANDOM: Sodium, Ur: <10 mmol/L

## 2023-06-11 LAB — POTASSIUM: Potassium: 2.4 mmol/L — CL (ref 3.5–5.1)

## 2023-06-11 MED ORDER — POTASSIUM CHLORIDE 10 MEQ/100ML IV SOLN
20.0000 meq | INTRAVENOUS | Status: DC
Start: 2023-06-12 — End: 2023-06-11
  Filled 2023-06-11 (×2): qty 200

## 2023-06-11 MED ORDER — MAGNESIUM SULFATE 4 GM/100ML IV SOLN
4.0000 g | Freq: Once | INTRAVENOUS | Status: AC
  Administered 2023-06-11: 4 g via INTRAVENOUS
  Filled 2023-06-11 (×2): qty 100

## 2023-06-11 MED ORDER — POTASSIUM CHLORIDE 10 MEQ/50ML IV SOLN
10.0000 meq | INTRAVENOUS | Status: AC
  Administered 2023-06-12 (×2): 10 meq via INTRAVENOUS
  Filled 2023-06-11 (×2): qty 50

## 2023-06-11 MED ORDER — POTASSIUM CHLORIDE 20 MEQ PO PACK
60.0000 meq | PACK | ORAL | Status: DC
Start: 2023-06-11 — End: 2023-06-11
  Administered 2023-06-11: 60 meq via ORAL
  Filled 2023-06-11: qty 3

## 2023-06-11 MED ORDER — NEPRO/CARBSTEADY PO LIQD
237.0000 mL | Freq: Three times a day (TID) | ORAL | Status: DC
  Administered 2023-06-11 – 2023-06-14 (×6): 237 mL via ORAL

## 2023-06-11 MED ORDER — POTASSIUM CHLORIDE CRYS ER 20 MEQ PO TBCR
40.0000 meq | EXTENDED_RELEASE_TABLET | ORAL | Status: DC
  Filled 2023-06-11 (×2): qty 2

## 2023-06-11 MED ORDER — SODIUM CHLORIDE 0.9 % IV SOLN: INTRAVENOUS | Status: DC

## 2023-06-11 MED ORDER — POTASSIUM CHLORIDE 10 MEQ/100ML IV SOLN
10.0000 meq | INTRAVENOUS | Status: DC
  Administered 2023-06-11 (×3): 10 meq via INTRAVENOUS
  Filled 2023-06-11 (×2): qty 100

## 2023-06-11 MED ORDER — POTASSIUM CHLORIDE CRYS ER 20 MEQ PO TBCR
40.0000 meq | EXTENDED_RELEASE_TABLET | Freq: Once | ORAL | Status: AC
  Administered 2023-06-11: 40 meq via ORAL
  Filled 2023-06-11: qty 2

## 2023-06-11 MED ORDER — POTASSIUM CHLORIDE 10 MEQ/100ML IV SOLN
10.0000 meq | INTRAVENOUS | Status: AC
  Administered 2023-06-11 (×4): 10 meq via INTRAVENOUS
  Filled 2023-06-11 (×3): qty 100

## 2023-06-11 MED ORDER — POTASSIUM CHLORIDE 10 MEQ/100ML IV SOLN
10.0000 meq | Freq: Once | INTRAVENOUS | Status: AC
  Administered 2023-06-11: 10 meq via INTRAVENOUS
  Filled 2023-06-11: qty 100

## 2023-06-11 NOTE — Progress Notes (Signed)
 Attempted to visit to introduce spiritual care and provide information on HCPOA paperwork. Pt and family in room along with RN providing pt care. Will follow up later on.

## 2023-06-11 NOTE — Evaluation (Signed)
 Occupational Therapy Evaluation Patient Details Name: Robin Daniels MRN: [id number] DOB: [birth year] Today's Date: 06/11/2023   History of Present Illness   Robin Daniels is a 68 y.o. female with medical history significant of stage IV pancreatic cancer, hypertension, hyperlipidemia presenting with with failure to thrive, AKI.  Patient and family bedside report roughly 2 weeks of decreased p.o. intake, mild nausea.  No abdominal pain.  No diarrhea.  No fevers or chills.  Noted on active chemotherapy in the setting of stage IV pancreatic cancer.  Followed by Dr. Donneta Romberg outpatient.  Has not had chemotherapy in the past week secondary to weakness.  No chest pain or shortness of breath.  No reported NSAID use.     Clinical Impressions Pt up in chair with family present upon OT/PT arrival and agreeable to evaluation.  Pt reporting no pain throughout session, only fatigue.  Presenting with generalized weakness contributing to functional decline with ADLs and mobility, requiring increased time for BADLs this date to allow for rest breaks (see below for ADL details).  HR 127 following bathroom ADLs/mobility, and reporting 8/10 "fatigue level" by end of session.  Pt reports historically occasional bowel incontinence d/t not being able to make it to the bathroom on time.  OT advised on benefits/uses of 3in1; pt and family both receptive to this recommendation.  Pt endorses good family support and goal to return home with family.  Would benefit from skilled OT in the acute setting to increase tolerance for ADLs and mobility, while working towards PLOF, in addition to [facility] OT upon d/c.     If plan is discharge home, recommend the following:   A little help with walking and/or transfers;A little help with bathing/dressing/bathroom;Assistance with cooking/housework;Assist for transportation;Help with stairs or ramp for entrance     Functional Status Assessment   Patient has had a recent decline in their  functional status and demonstrates the ability to make significant improvements in function in a reasonable and predictable amount of time.     Equipment Recommendations   BSC/3in1     Recommendations for Other Services         Precautions/Restrictions   Precautions Precautions: Fall Restrictions Weight Bearing Restrictions Per Provider Order: No     Mobility Bed Mobility               General bed mobility comments: NT; received and left in recliner Patient Response: Cooperative  Transfers Overall transfer level: Needs assistance Equipment used: Rolling walker (2 wheels) Transfers: Sit to/from Stand Sit to Stand: Contact guard assist                  Balance Overall balance assessment: Needs assistance Sitting-balance support: Single extremity supported, Feet supported Sitting balance-Leahy Scale: Fair     Standing balance support: Bilateral upper extremity supported, During functional activity, Reliant on assistive device for balance Standing balance-Leahy Scale: Poor Standing balance comment: Pt reaches for furniture/walls when ambulating without walker                           ADL either performed or assessed with clinical judgement   ADL Overall ADL's : Needs assistance/impaired     Grooming: Standing;Wash/dry hands;Supervision/safety Grooming Details (indicate cue type and reason): pt leans forearms on sink d/t weakness             Lower Body Dressing: Minimal assistance Lower Body Dressing Details (indicate cue type and reason): assist to step  LEs into clean brief d/t fatigue while seated; pt able to hike brief up in standing with extra time/min guard for balance Toilet Transfer: Minimal assistance;Grab bars   Toileting- Clothing Manipulation and Hygiene: Set up       Functional mobility during ADLs: Contact guard assist;Rolling walker (2 wheels) General ADL Comments: Ambulatory to bathroom for toileting with min  guard, intermittently reaching for wall/furniture for support.  1 small LOB upon entering bathroom, recovered with min A.  Used RW to amb back to chair with close supv-min guard.     Vision Patient Visual Report: No change from baseline                         Pertinent Vitals/Pain Pain Assessment Pain Assessment: No/denies pain     Extremity/Trunk Assessment Upper Extremity Assessment Upper Extremity Assessment: Generalized weakness   Lower Extremity Assessment Lower Extremity Assessment: Generalized weakness       Communication Communication Communication: No apparent difficulties   Cognition Arousal: Alert Behavior During Therapy: WFL for tasks assessed/performed Cognition: No apparent impairments                               Following commands: Intact       Cueing  General Comments   Cueing Techniques: Verbal cues  HR 127 with activity   Exercises           Home Living Family/patient expects to be discharged to:: Other (Comment) (2nd floor apartment, no elevator) Living Arrangements: Children (lives with son and grandson)                                      Prior Functioning/Environment               Mobility Comments: ambulatory household distances; intermittent use of rollator ADLs Comments: modified indep-indep with basic self care; increased time; family manages IADLs at baseline    OT Problem List: Decreased strength;Decreased knowledge of use of DME or AE;Decreased knowledge of precautions;Decreased activity tolerance;Impaired balance (sitting and/or standing)   OT Treatment/Interventions: Self-care/ADL training;Therapeutic exercise;Patient/family education;Energy conservation;Balance training;Therapeutic activities;DME and/or AE instruction      OT Goals(Current goals can be found in the care plan section)   Acute Rehab OT Goals Patient Stated Goal: Home with family OT Goal Formulation: With  patient/family Time For Goal Achievement: 06/25/23 Potential to Achieve Goals: Good ADL Goals Pt Will Perform Lower Body Dressing: with supervision;sit to/from stand Pt Will Transfer to Toilet: with supervision;ambulating;grab bars;bedside commode Pt Will Perform Toileting - Clothing Manipulation and hygiene: with supervision;sitting/lateral leans   OT Frequency:  Min 1X/week    Co-evaluation PT/OT/SLP Co-Evaluation/Treatment: Yes Reason for Co-Treatment: Complexity of the patient's impairments (multi-system involvement);To address functional/ADL transfers PT goals addressed during session: Mobility/safety with mobility;Proper use of DME OT goals addressed during session: ADL's and self-care;Proper use of Adaptive equipment and DME      AM-PAC OT "6 Clicks" Daily Activity     Outcome Measure Help from another person eating meals?: None Help from another person taking care of personal grooming?: A Little Help from another person toileting, which includes using toliet, bedpan, or urinal?: A Little Help from another person bathing (including washing, rinsing, drying)?: A Lot Help from another person to put on and taking off regular upper body clothing?: A Little Help  from another person to put on and taking off regular lower body clothing?: A Little 6 Click Score: 18   End of Session Equipment Utilized During Treatment: Gait belt;Rolling walker (2 wheels) Nurse Communication: Mobility status  Activity Tolerance: Patient limited by fatigue Patient left: in chair;with call bell/phone within reach;with family/visitor present  OT Visit Diagnosis: Unsteadiness on feet (R26.81);Muscle weakness (generalized) (M62.81)                Time: 7829-5621 OT Time Calculation (min): 24 min Charges:  OT General Charges $OT Visit: 1 Visit OT Evaluation $OT Eval Moderate Complexity: 1 Mod OT Treatments $Self Care/Home Management : 8-22 mins  Danelle Earthly, MS, OTR/L   Otis Dials 06/11/2023, 2:42 PM

## 2023-06-11 NOTE — Progress Notes (Signed)
 [facility]  ROUNDING NOTE   Subjective:  Robin Daniels is a 68 y.o. 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 Patient reports improved appetite and is looking forward to eating. Denies pain. Denies shortness of breath.  Objective:  Vital signs in last 24 hours:  Temp:  [97.4 F (36.3 C)-97.7 F (36.5 C)] 97.4 F (36.3 C) (03/15 1526) Pulse Rate:  [87-98] 87 (03/15 1526) Resp:  [16-20] 18 (03/15 1526) BP: (98-107)/(57-70) 98/57 (03/15 1526) SpO2:  [96 %-100 %] 100 % (03/15 1526)  Weight change:  Filed Weights   06/10/23 0846  Weight: 67 kg    Intake/Output: I/O last 3 completed shifts: In: 773.8 [I.V.:389.5; IV Piggyback:384.3] Out: 200 [Urine:200]   Intake/Output this shift:  Total I/O In: 2258.3 [P.O.:240; I.V.:1390.9; IV Piggyback:627.5] Out: -   Physical Exam: General: NAD,   Head: Normocephalic, atraumatic. Moist oral mucosal membranes  Eyes: Anicteric, PERRL  Neck: Supple, trachea midline  Lungs:  Clear to auscultation  Heart: Regular rate and rhythm  Abdomen:  Soft, nontender,   Extremities:  None peripheral edema.  Neurologic: Nonfocal, moving all four extremities  Skin: No lesions  Access: None    Basic Metabolic Panel: Recent Labs  Lab 06/10/23 0849 06/10/23 0859 06/10/23 1509 06/10/23 1655 06/10/23 2121 06/11/23 0135 06/11/23 0450  NA 126*  --  127* 129* 127* 125* 127*  K <2.0*  --  2.3*  --  2.5*  --  2.2*  CL 97*  --  101  --   --   --  99  CO2 10*  --  11*  --   --   --  17*  GLUCOSE 183*  --  138*  --   --   --  146*  BUN 78*  --  78*  --   --   --  67*  CREATININE 5.01*  --  4.37*  --   --   --  3.71*  CALCIUM 8.5*  --  7.9*  --   --   --  7.6*  MG  --  1.5*  --   --   --   --  1.3*  PHOS  --  7.4*  --   --   --   --   --     Liver Function  Tests: Recent Labs  Lab 06/10/23 0849 06/11/23 0450  AST 20 21  ALT 32 22  ALKPHOS 127* 93  BILITOT 0.6 0.6  PROT 7.6 6.0*  ALBUMIN 3.3* 2.7*   Recent Labs  Lab 06/10/23 0849  LIPASE 42   No results for input(s): "AMMONIA" in the last 168 hours.  CBC: Recent Labs  Lab 06/10/23 0849 06/11/23 0450  WBC 9.5 10.4  NEUTROABS 6.4  --   HGB 10.1* 8.0*  HCT 29.7* 22.2*  MCV 71.9* 70.9*  PLT 607* 380    Cardiac Enzymes: No results for input(s): "CKTOTAL", "CKMB", "CKMBINDEX", "TROPONINI" in the last 168 hours.  BNP: Invalid input(s): "POCBNP"  CBG: No results for input(s): "GLUCAP" in the last 168 hours.  Microbiology: Results for orders placed or performed during the hospital encounter of 07/15/20  Fungus Culture With Stain     Status: None   Collection Time: 07/15/20  9:08 AM   Specimen: Abscess; Wound  Result Value Ref Range  Status   Fungus Stain Final report  Final   Fungus (Mycology) Culture Final report  Final    Comment: (NOTE) Performed At: [facility] [address] [place], [place] [id number] Jolene Schimke MD UX:[id number]    Fungal Source WOUND  Final    Comment: Performed at [facility], [address] [place], [place] [address]  Aerobic/Anaerobic Culture w Gram Stain (surgical/deep wound)     Status: None   Collection Time: 07/15/20  9:08 AM   Specimen: Abscess; Wound  Result Value Ref Range Status   Specimen Description WOUND  Final   Special Requests GALLBLADDER CULTURE SPEC A  Final   Gram Stain   Final    ABUNDANT WBC PRESENT,BOTH PMN AND MONONUCLEAR ABUNDANT GRAM POSITIVE COCCI IN PAIRS IN CLUSTERS RARE GRAM NEGATIVE COCCOBACILLI RARE GRAM POSITIVE RODS    Culture   Final    ABUNDANT STREPTOCOCCUS CONSTELLATUS NO ANAEROBES ISOLATED Performed at [facility], [address], [place], [place] [address]    Report Status 07/21/2020 FINAL  Final   Organism ID, Bacteria STREPTOCOCCUS CONSTELLATUS  Final       Susceptibility   Streptococcus constellatus - MIC*    PENICILLIN INTERMEDIATE Intermediate     CEFTRIAXONE 1 SENSITIVE Sensitive     ERYTHROMYCIN <=0.12 SENSITIVE Sensitive     LEVOFLOXACIN <=0.25 SENSITIVE Sensitive     VANCOMYCIN 0.5 SENSITIVE Sensitive     * ABUNDANT STREPTOCOCCUS CONSTELLATUS  Fungus Culture Result     Status: None   Collection Time: 07/15/20  9:08 AM  Result Value Ref Range Status   Result 1 Comment  Final    Comment: (NOTE) KOH/Calcofluor preparation:  no fungus observed. Performed At: [facility] [address] [place], [place] [id number] Jolene Schimke MD FI:[id number]   Fungal organism reflex     Status: None   Collection Time: 07/15/20  9:08 AM  Result Value Ref Range Status   Fungal result 1 Comment  Final    Comment: (NOTE) No yeast or mold isolated after 4 weeks. Performed At: [facility] [address] [place], [place] [id number] Jolene Schimke MD WF:[id number]   Culture, blood (routine x 2)     Status: None   Collection Time: 07/17/20  2:53 AM   Specimen: BLOOD  Result Value Ref Range Status   Specimen Description BLOOD RIGHT ANTECUBITAL  Final   Special Requests   Final    BOTTLES DRAWN AEROBIC AND ANAEROBIC Blood Culture adequate volume   Culture   Final    NO GROWTH 5 DAYS Performed at [facility], [address], [place], [place] [address]    Report Status 07/22/2020 FINAL  Final  Culture, blood (routine x 2)     Status: None   Collection Time: 07/17/20  3:01 AM   Specimen: BLOOD RIGHT FOREARM  Result Value Ref Range Status   Specimen Description BLOOD RIGHT FOREARM  Final   Special Requests AEROBIC BOTTLE ONLY Blood Culture adequate volume  Final   Culture   Final    NO GROWTH 5 DAYS Performed at [facility], [address], [place], [place] [address]    Report Status 07/22/2020 FINAL  Final    Coagulation Studies: No results for input(s): "LABPROT", "INR" in the last 72  hours.  Urinalysis: Recent Labs    06/11/23 0223  COLORURINE YELLOW*  LABSPEC 1.012  PHURINE 5.0  GLUCOSEU NEGATIVE  HGBUR NEGATIVE  BILIRUBINUR NEGATIVE  KETONESUR NEGATIVE  PROTEINUR 30*  NITRITE NEGATIVE  LEUKOCYTESUR NEGATIVE      Imaging: CT ABDOMEN PELVIS WO CONTRAST Result Date: 06/10/2023 CLINICAL DATA:  68 year old female with a history of abdominal pain EXAM: CT ABDOMEN AND PELVIS WITHOUT CONTRAST TECHNIQUE: Multidetector CT imaging of the abdomen and pelvis was performed following the standard protocol without IV contrast. RADIATION DOSE REDUCTION: This exam was performed according to the departmental dose-optimization program which includes automated exposure control, adjustment of the mA and/or kV according to patient size and/or use of iterative reconstruction technique. COMPARISON:  CT 01/25/2023, PET-CT 05/24/2023 FINDINGS: Lower chest: Mild centrilobular nodularity within the medial right lower lobe Hepatobiliary: Pneumobilia. Focal 14 mm hypodensity the lower liver, poorly characterized on the noncontrast CT though appears new from the prior PET. Redemonstration cholecystectomy, pancreatico duodenectomy. Unchanged position of the stent at the biliary anastomosis. The surgical site is poorly characterized with the absence of contrast. Pancreas: Surgical changes of pancreatico duodenectomy, stent at the anastomosis. Surgical site is poorly characterized with the absence of contrast. Spleen: Unremarkable Adrenals/Urinary Tract: - Right adrenal gland:  Unremarkable - Left adrenal gland: Unremarkable. - Right kidney: No hydronephrosis, nephrolithiasis, inflammation, or ureteral dilation. - Left Kidney: No hydronephrosis, nephrolithiasis, inflammation, or ureteral dilation. - Urinary Bladder: Unremarkable. Stomach/Bowel: Redemonstration surgical changes at the stomach, surgical changes of pancreatico duodenectomy. Relatively unremarkable small bowel. The length of the colon is  fluid-filled, without wall thickening or significant focal inflammatory changes. No evidence of obstruction as the fluid extends through the rectum. Vascular/Lymphatic: Atherosclerotic changes of the abdominal aorta, mesenteric arteries, renal arteries, iliac arteries. Small mesenteric lymph nodes nonspecific. Reproductive: Unremarkable uterus/adnexa Other: Periumbilical fat. Musculoskeletal: No acute displaced fracture. Degenerative changes of the thoracolumbar spine. IMPRESSION: The length of colon is fluid-filled, nonspecific finding though potentially representing enteritis and/or colitis. No evidence of obstruction, and no focal inflammation. Redemonstration of surgical changes of pancreatico duodenectomy. The surgical site is poorly characterized with the absence of contrast. Pattern of centrilobular nodularity in the medial right lower lobe compatible with infectious/inflammatory change. **An incidental finding of potential clinical significance has been found. Focal hypodensity of the right liver measuring 14 mm. A focal liver lesion cannot be excluded and in the setting of the patient's known prior pancreatic cancer, follow-up with oncology is recommended to determine further imaging strategy such as contrast MRI or CT.** Aortic Atherosclerosis (ICD10-I70.0). Additional ancillary findings as above Electronically Signed   By: Gilmer Mor D.O.   On: 06/10/2023 15:57   DG Chest 2 View Result Date: 06/10/2023 CLINICAL DATA:  Lethargy, failure to thrive. EXAM: CHEST - 2 VIEW COMPARISON:  July 15, 2020.  May 24, 2023. FINDINGS: The heart size and mediastinal contours are within normal limits. 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 No definite consolidative process is noted. Right internal jugular Port-A-Cath is unchanged. The visualized skeletal structures are unremarkable. IMPRESSION: No active cardiopulmonary disease. Electronically Signed    By: Lupita Raider M.D.   On: 06/10/2023 11:03     Medications:    sodium bicarbonate 150 mEq in dextrose 5 % 1,150 mL infusion 100 mL/hr at 06/11/23 1629    Chlorhexidine Gluconate Cloth  6 each Topical Daily   heparin  5,000 Units Subcutaneous Q8H   sodium chloride flush  10-40 mL Intracatheter Q12H   ondansetron **OR** ondansetron (ZOFRAN) IV, sodium chloride flush  Assessment/ Plan:  Robin Daniels is a 68 y.o. female with medical problems of stage IV pancreatic  cancer of head of pancreas, depression    was admitted on 06/10/2023 for :   AKI (acute kidney injury) (HCC) [N17.9]   1.  Acute kidney injury Acute kidney injury likely secondary to Severe ATN. Currently patient is oliguric but hopefully with IV fluid administration, her urine output will improve. Lab Results  Component Value Date   CREATININE 3.71 (H) 06/11/2023   CREATININE 4.37 (H) 06/10/2023   CREATININE 5.01 (H) 06/10/2023     Intake/Output Summary (Last 24 hours) at 06/11/2023 1724 Last data filed at 06/11/2023 1139 Gross per 24 hour  Intake 3032.11 ml  Output 200 ml  Net 2832.11 ml    2.  Severe hypokalemia K 2.2 06/11/2023 Replaced by primary team. Continue to encourage PO intake.  3.  Hyponatremia K 127 06/11/2023, continue Na bicarb D5  4. Hypomagnesemia  Mg  1.7 06/11/2022 Replaced by primary team  5.  Acute metabolic acidosis Bicarb 17, on bicarb gtt 10  6.  Anemia   HgB 8 06/11/2023, significant drop from 10 to 8 Appreciate hem oncology recommendations for anemia management    LOS: 1 Alexine Pilant Tonny Bollman 3/15/20255:12 PM

## 2023-06-11 NOTE — Evaluation (Addendum)
 Physical Therapy Evaluation Patient Details Name: Robin Daniels MRN: [id number] DOB: 08-Mar-1956 Today's Date: 06/11/2023  History of Present Illness  Pt is a 68 y/o F admitted on 06/10/23 after presenting with c/o decreased p.o. intake, mild nausea. Pt is being treated for AKI, metabolic acidosis, hyponatremia & hypokalemia. PMH: Stage IV pancreatic CA, HTN, HLD, anemia, anxiety, vertigo  Clinical Impression  Pt seen for PT evaluation with pt agreeable, son & grandson present for session. Pt reports prior to admission she was independent without AD, denies falls, but recently got a rollator to use. On this date, pt is able to ambulate without AD with min assist, CGA when using RW. Pt fatigues quickly & requires standing rest break to walk from bathroom>recliner. Pt would benefit from ongoing PT services to progress mobility as able.   Pt notes 8/10 fatigue level at end of session.  Pt noted to have low K+ but receiving supplementation & MD cleared pt for participation in therapy.      If plan is discharge home, recommend the following: A little help with walking and/or transfers;A little help with bathing/dressing/bathroom;Assistance with cooking/housework;Assist for transportation;Help with stairs or ramp for entrance   Can travel by private vehicle        Equipment Recommendations BSC/3in1;Rolling walker (2 wheels)  Recommendations for Other Services       Functional Status Assessment Patient has had a recent decline in their functional status and demonstrates the ability to make significant improvements in function in a reasonable and predictable amount of time.     Precautions / Restrictions Precautions Precautions: Fall Restrictions Weight Bearing Restrictions Per Provider Order: No      Mobility  Bed Mobility               General bed mobility comments: not tested, pt received & left sitting in recliner    Transfers Overall transfer level: Needs  assistance Equipment used: Rolling walker (2 wheels), None Transfers: Sit to/from Stand Sit to Stand: Contact guard assist                Ambulation/Gait Ambulation/Gait assistance: Contact guard assist, Min assist, Mod assist Gait Distance (Feet): 25 Feet (+ 25 ft) Assistive device: None, Rolling walker (2 wheels) Gait Pattern/deviations: Decreased step length - right, Decreased step length - left, Decreased stride length Gait velocity: decreased     General Gait Details: Pt ambulates recliner>bathroom without AD with min assist with slow guarded gait, pt intermittently holding hands out in front of her. Pt ambulates bathroom>recliner with RW & CGA with improved balance but pt very fatigued requiring 1 standing rest break.  Stairs            Wheelchair Mobility     Tilt Bed    Modified Rankin (Stroke Patients Only)       Balance Overall balance assessment: Needs assistance Sitting-balance support: Feet supported Sitting balance-Leahy Scale: Fair     Standing balance support: During functional activity, No upper extremity supported Standing balance-Leahy Scale: Poor                               Pertinent Vitals/Pain Pain Assessment Pain Assessment: No/denies pain    Home Living Family/patient expects to be discharged to:: Private residence Living Arrangements: Children (mother) Available Help at Discharge: Family;Available 24 hours/day Type of Home: Apartment Home Access: Stairs to enter Entrance Stairs-Rails: Left Entrance Stairs-Number of Steps: flight   Home Layout:  One level Home Equipment: Rollator (4 wheels)      Prior Function               Mobility Comments: Ambulatory without AD, recently got a rollator a few days prior, denies falls ADLs Comments: modified indep-indep with basic self care; increased time; family manages IADLs at baseline     Extremity/Trunk Assessment   Upper Extremity Assessment Upper Extremity  Assessment: Generalized weakness    Lower Extremity Assessment Lower Extremity Assessment: Generalized weakness       Communication   Communication Communication: No apparent difficulties    Cognition Arousal: Alert Behavior During Therapy: WFL for tasks assessed/performed                             Following commands: Intact       Cueing Cueing Techniques: Verbal cues     General Comments General comments (skin integrity, edema, etc.): max HR 127 bpm with activity    Exercises     Assessment/Plan    PT Assessment Patient needs continued PT services  PT Problem List Decreased strength;Decreased activity tolerance;Decreased balance;Decreased mobility;Decreased knowledge of use of DME       PT Treatment Interventions DME instruction;Balance training;Gait training;Neuromuscular re-education;Stair training;Functional mobility training;Therapeutic activities;Therapeutic exercise;Manual techniques;Patient/family education    PT Goals (Current goals can be found in the Care Plan section)  Acute Rehab PT Goals Patient Stated Goal: get better PT Goal Formulation: With patient/family Time For Goal Achievement: 06/25/23 Potential to Achieve Goals: Good    Frequency Min 2X/week     Co-evaluation   Reason for Co-Treatment: Complexity of the patient's impairments (multi-system involvement);To address functional/ADL transfers (unsure of pt's ability to tolerate 2 seperate sessions) PT goals addressed during session: Mobility/safety with mobility;Proper use of DME OT goals addressed during session: ADL's and self-care;Proper use of Adaptive equipment and DME       AM-PAC PT "6 Clicks" Mobility  Outcome Measure Help needed turning from your back to your side while in a flat bed without using bedrails?: None Help needed moving from lying on your back to sitting on the side of a flat bed without using bedrails?: A Little Help needed moving to and from a bed to a  chair (including a wheelchair)?: A Little Help needed standing up from a chair using your arms (e.g., wheelchair or bedside chair)?: A Little Help needed to walk in hospital room?: A Little Help needed climbing 3-5 steps with a railing? : A Lot 6 Click Score: 18    End of Session Equipment Utilized During Treatment: Gait belt Activity Tolerance: Patient limited by fatigue Patient left: in chair;with chair alarm set;with call bell/phone within reach;with family/visitor present Nurse Communication: Mobility status PT Visit Diagnosis: Muscle weakness (generalized) (M62.81);Unsteadiness on feet (R26.81)    Time: 7829-5621 PT Time Calculation (min) (ACUTE ONLY): 24 min   Charges:   PT Evaluation $PT Eval Low Complexity: 1 Low   PT General Charges $$ ACUTE PT VISIT: 1 Visit         Aleda Grana, PT, DPT 06/11/23, 3:12 PM   Sandi Mariscal 06/11/2023, 3:11 PM

## 2023-06-11 NOTE — Progress Notes (Signed)
 CRITICAL VALUE STICKER  CRITICAL VALUE: K+ 2.4  RECEIVER (on-site recipient of call): Dorthey Sawyer., RN   DATE & TIME NOTIFIED: 06/11/23 2049  MESSENGER (representative from lab): Lab  MD NOTIFIED:  Manuela Schwartz, NP  TIME OF NOTIFICATION: 2049  RESPONSE:  face to face. Acknowledged; will check a few other labs. Keep pt on cardiac monitor.

## 2023-06-11 NOTE — Progress Notes (Signed)
 MD Mervyn Skeeters regarding pt's critical lab: K<2. Concern from nursing staff d/t pt receiving multiple runs of IV K, with no change. Verbal order from MD to place transfer orders for high level of care for closer monitoring. Additional orders for potassium supplementation placed.  STAT EKG ordered, revealed critical result of long QT. Manuela Schwartz, NP notified at bedside.

## 2023-06-11 NOTE — Progress Notes (Signed)
  PROGRESS NOTE    Robin Daniels  AOZ:[id number] DOB: 1955/08/07 DOA: 06/10/2023 PCP: Louis Matte, MD  [id number]  LOS: 1 day   Brief hospital course:   Assessment & Plan: Robin Daniels is a 68 y.o. female with medical history significant of stage IV pancreatic cancer, hypertension, hyperlipidemia presenting with with failure to thrive, AKI.  Patient and family bedside report roughly 2 weeks of decreased p.o. intake, mild nausea.  No abdominal pain.  No diarrhea.  No fevers or chills.  Noted on active chemotherapy in the setting of stage IV pancreatic cancer.  Followed by Dr. Donneta Romberg outpatient.  Has not had chemotherapy in the past week secondary to weakness.    * AKI (acute kidney injury) (HCC) Metabolic Acidosis Severe AKI with noted creatinine going from 1-5 with GFR of less than 10 on presentation.  Markedly dry Noted baseline stage IV pancreatic cancer as well as active chemotherapy are confounding issues Bicarb 10 --cont MIVF as Na bicarb D5  Hyponatremia Sodium 126 on presentation --cont MIVF as Na bicarb D5  Hypokalemia --need to improve Mag level to correct potassium level --monitor and supplement PRN  Hypomag --monitor and supplement with IV mag  Cancer of head of pancreas (HCC) Baseline stage IV pancreatic cancer followed by Dr. Guy Sandifer outpatient On chemotherapy Noted concurrent failure to thrive in the setting of acute kidney injury today Prior hospice evaluation outpatient palliative care consulted on admission  HTN --BP soft.  Hold home BP meds  HLD (hyperlipidemia) Continue statin  Chronic anemia --Hgb 10.1 on presentation, decreased to 8.0 this morning, likely dilutional. --anemia workup   DVT prophylaxis: Heparin SQ Code Status: Full code  Family Communication: family updated at bedside today Level of care: Telemetry Medical Dispo:   The patient is from: home Anticipated d/c is to: home Anticipated d/c date is: 2  days   Subjective and Interval History:  Pt reported feeling much better, her appetite back.   Objective: Vitals:   06/10/23 2055 06/11/23 0455 06/11/23 0830 06/11/23 1526  BP: 107/70 103/67 (!) 105/57 (!) 98/57  Pulse: 98 92 95 87  Resp: 16 20 18 18   Temp: 97.6 F (36.4 C) 97.7 F (36.5 C) 97.7 F (36.5 C) (!) 97.4 F (36.3 C)  TempSrc: Oral  Oral   SpO2: 100% 96% 99% 100%  Weight:      Height:        Intake/Output Summary (Last 24 hours) at 06/11/2023 1650 Last data filed at 06/11/2023 1139 Gross per 24 hour  Intake 3032.11 ml  Output 200 ml  Net 2832.11 ml   Filed Weights   06/10/23 0846  Weight: 67 kg    Examination:   Constitutional: NAD, AAOx3 HEENT: conjunctivae and lids normal, EOMI CV: No cyanosis.   RESP: normal respiratory effort, on RA Extremities: No effusions, edema in BLE SKIN: warm, dry Neuro: II - XII grossly intact.   Psych: Normal mood and affect.  Appropriate judgement and reason   Data Reviewed: I have personally reviewed labs and imaging studies  Time spent: 50 minutes  Darlin Priestly, MD [facility] If 7PM-7AM, please contact night-coverage 06/11/2023, 4:50 PM

## 2023-06-11 NOTE — Progress Notes (Signed)
       CROSS COVER NOTE  NAME: Robin Daniels MRN: [id number] DOB : 15-Jan-1956 ATTENDING PHYSICIAN: Darlin Priestly, MD    Date of Service   06/11/2023   HPI/Events of Note   of nursing staff of patient needing higher level of care due to critical K level <2 reported around 5 pm Patient with hx of stage IV pancreatic cancer (on chemo, hypertension, hyperlipidemia and r presented with weakness and labs revealed severe dehydration with AKI of creatinine of 5.01, K < 2.0 NA 126, Mag 1.5,phos 7.4 and bicarb of  10 on chem panel.  Patent was started on bicarb infusion, electrolyte replacements and renal consulled  Interventions   Assessment/Plan:    06/11/2023    9:08 PM 06/11/2023    3:26 PM 06/11/2023    8:30 AM  Vitals with BMI  Systolic 100 98 105  Diastolic 52 57 57  Pulse 83 87 95   Patient denies pain currently. Reporting ongoing diarrhea EKG SR rate 90 no T wave abnormalitiesa no u awave, Qtc prolonged 582 Safe for already planned transfer to PCU Continuous tele monitoring with QT monitoring alarms on Keep oral K supplementation at 40 MeQ doses to minimize diarrhea effect of K Repeat K now 2.4 and corrected calcium 8.8, repreat mag  earlier in day normal Nepro shakes TID BM Continue oral and IV K replacements Continue NS IVF VBG added to am labs     Donnie Mesa NP [facility] Cross Cover 7pm-7am - check amion for availability Pager [phone]

## 2023-06-12 DIAGNOSIS — E876 Hypokalemia: Secondary | ICD-10-CM

## 2023-06-12 DIAGNOSIS — E871 Hypo-osmolality and hyponatremia: Secondary | ICD-10-CM

## 2023-06-12 DIAGNOSIS — N179 Acute kidney failure, unspecified: Secondary | ICD-10-CM | POA: Diagnosis not present

## 2023-06-12 DIAGNOSIS — Z515 Encounter for palliative care: Secondary | ICD-10-CM | POA: Diagnosis not present

## 2023-06-12 DIAGNOSIS — C250 Malignant neoplasm of head of pancreas: Secondary | ICD-10-CM

## 2023-06-12 LAB — CBC
HCT: 20 % — ABNORMAL LOW (ref 36.0–46.0)
Hemoglobin: 7.2 g/dL — ABNORMAL LOW (ref 12.0–15.0)
MCH: 25.5 pg — ABNORMAL LOW (ref 26.0–34.0)
MCHC: 36 g/dL (ref 30.0–36.0)
MCV: 70.9 fL — ABNORMAL LOW (ref 80.0–100.0)
Platelets: 347 10*3/uL (ref 150–400)
RBC: 2.82 MIL/uL — ABNORMAL LOW (ref 3.87–5.11)
RDW: 18.2 % — ABNORMAL HIGH (ref 11.5–15.5)
WBC: 8.1 10*3/uL (ref 4.0–10.5)
nRBC: 0.2 % (ref 0.0–0.2)

## 2023-06-12 LAB — BASIC METABOLIC PANEL
Anion gap: 10 (ref 5–15)
BUN: 58 mg/dL — ABNORMAL HIGH (ref 8–23)
CO2: 17 mmol/L — ABNORMAL LOW (ref 22–32)
Calcium: 7.7 mg/dL — ABNORMAL LOW (ref 8.9–10.3)
Chloride: 102 mmol/L (ref 98–111)
Creatinine, Ser: 2.71 mg/dL — ABNORMAL HIGH (ref 0.44–1.00)
GFR, Estimated: 18 mL/min — ABNORMAL LOW (ref >60)
Glucose, Bld: 119 mg/dL — ABNORMAL HIGH (ref 70–99)
Potassium: 2.7 mmol/L — CL (ref 3.5–5.1)
Sodium: 129 mmol/L — ABNORMAL LOW (ref 135–145)

## 2023-06-12 LAB — POTASSIUM: Potassium: 3.1 mmol/L — ABNORMAL LOW (ref 3.5–5.1)

## 2023-06-12 LAB — VITAMIN B12: Vitamin B-12: 2288 pg/mL — ABNORMAL HIGH (ref 180–914)

## 2023-06-12 LAB — MAGNESIUM: Magnesium: 2.7 mg/dL — ABNORMAL HIGH (ref 1.7–2.4)

## 2023-06-12 MED ORDER — POTASSIUM CHLORIDE 10 MEQ/100ML IV SOLN
10.0000 meq | INTRAVENOUS | Status: AC
  Administered 2023-06-12 (×4): 10 meq via INTRAVENOUS
  Filled 2023-06-12 (×4): qty 100

## 2023-06-12 MED ORDER — SODIUM BICARBONATE 650 MG PO TABS
650.0000 mg | ORAL_TABLET | Freq: Three times a day (TID) | ORAL | Status: DC
  Administered 2023-06-12 – 2023-06-16 (×14): 650 mg via ORAL
  Filled 2023-06-12 (×14): qty 1

## 2023-06-12 MED ORDER — POTASSIUM CHLORIDE 20 MEQ PO PACK
40.0000 meq | PACK | ORAL | Status: AC
  Administered 2023-06-12 (×2): 40 meq via ORAL
  Filled 2023-06-12 (×2): qty 2

## 2023-06-12 MED ORDER — POTASSIUM CHLORIDE 20 MEQ PO PACK: 40.0000 meq | PACK | Freq: Two times a day (BID) | ORAL | Status: DC

## 2023-06-12 NOTE — Plan of Care (Signed)

## 2023-06-12 NOTE — Progress Notes (Signed)
   06/12/23 1100  Spiritual Encounters  Type of Visit Initial  Care provided to: Pt and family  Conversation partners present during encounter Other (comment) (Pallative Care representative present)  Referral source Physician  Reason for visit Advance directives  OnCall Visit No  Interventions  Spiritual Care Interventions Made Other (comment) (Pallative care in the room. Chaplain left advance directive packet for patient and family to look over)  Spiritual Care Plan  Spiritual Care Issues Still Outstanding No further spiritual care needs at this time (see row info)

## 2023-06-12 NOTE — Plan of Care (Signed)
  Problem: Education: Goal: Knowledge of General Education information will improve Description: Including pain rating scale, medication(s)/side effects and non-pharmacologic comfort measures Outcome: Progressing   Problem: Health Behavior/Discharge Planning: Goal: Ability to manage health-related needs will improve Outcome: Progressing   Problem: Clinical Measurements: Goal: Ability to maintain clinical measurements within normal limits will improve Outcome: Progressing Goal: Diagnostic test results will improve Outcome: Progressing Goal: Cardiovascular complication will be avoided Outcome: Progressing   Problem: Nutrition: Goal: Adequate nutrition will be maintained Outcome: Progressing   Problem: Coping: Goal: Level of anxiety will decrease Outcome: Progressing   Problem: Elimination: Goal: Will not experience complications related to bowel motility Outcome: Progressing Goal: Will not experience complications related to urinary retention Outcome: Progressing   Problem: Safety: Goal: Ability to remain free from injury will improve Outcome: Progressing

## 2023-06-12 NOTE — Progress Notes (Signed)
 [facility]  ROUNDING NOTE   Subjective:  Robin Daniels is a 68 y.o. 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 Family at the bedside. Patient reports feeling better. Counseled on eating more potassium filled food.   Objective:  Vital signs in last 24 hours:  Temp:  [97.7 F (36.5 C)-97.9 F (36.6 C)] 97.9 F (36.6 C) (03/16 1558) Pulse Rate:  [78-86] 78 (03/16 1558) Resp:  [17-19] 18 (03/16 1558) BP: (99-116)/(52-61) 116/61 (03/16 1558) SpO2:  [98 %-100 %] 100 % (03/16 1558) Weight:  [66.2 kg] 66.2 kg (03/15 2108)  Weight change: -0.8 kg Filed Weights   06/10/23 0846 06/11/23 2108  Weight: 67 kg 66.2 kg    Intake/Output: I/O last 3 completed shifts: In: 4256.5 [P.O.:590; I.V.:2529.5; IV Piggyback:1137] Out: 350 [Urine:200; Emesis/NG output:150]   Intake/Output this shift:  Total I/O In: 1345.6 [I.V.:1345.6] Out: -   Physical Exam: General: NAD,   Head: Normocephalic, atraumatic. Moist oral mucosal membranes  Eyes: Anicteric, PERRL  Neck: Supple, trachea midline  Lungs:  Clear to auscultation  Heart: Regular rate and rhythm  Abdomen:  Soft, nontender,   Extremities:  No peripheral edema.  Neurologic: Nonfocal, moving all four extremities  Skin: No lesions  Access: None    Basic Metabolic Panel: Recent Labs  Lab 06/10/23 0849 06/10/23 0859 06/10/23 1509 06/10/23 1655 06/10/23 2121 06/11/23 0135 06/11/23 0450 06/11/23 1654 06/11/23 2004 06/12/23 0258  NA 126*  --  127*   < > 127* 125* 127* 126*  --  129*  K <2.0*  --  2.3*  --  2.5*  --  2.2* <2.0* 2.4* 2.7*  CL 97*  --  101  --   --   --  99 92*  --  102  CO2 10*  --  11*  --   --   --  17* 20*  --  17*  GLUCOSE 183*  --  138*  --   --   --  146* 175*  --  119*  BUN 78*  --  78*  --   --   --  67* 62*  --  58*  CREATININE  5.01*  --  4.37*  --   --   --  3.71* 3.09*  --  2.71*  CALCIUM 8.5*  --  7.9*  --   --   --  7.6* 7.8* 7.8* 7.7*  MG  --  1.5*  --   --   --   --  1.3* 2.8*  --  2.7*  PHOS  --  7.4*  --   --   --   --   --   --   --   --    < > = values in this interval not displayed.    Liver Function Tests: Recent Labs  Lab 06/10/23 0849 06/11/23 0450 06/11/23 2004  AST 20 21  --   ALT 32 22  --   ALKPHOS 127* 93  --   BILITOT 0.6 0.6  --   PROT 7.6 6.0*  --   ALBUMIN 3.3* 2.7* 2.7*   Recent Labs  Lab 06/10/23 0849  LIPASE 42   No results for input(s): "AMMONIA" in the last 168 hours.  CBC: Recent Labs  Lab 06/10/23 0849 06/11/23 0450 06/12/23 0258  WBC 9.5  10.4 8.1  NEUTROABS 6.4  --   --   HGB 10.1* 8.0* 7.2*  HCT 29.7* 22.2* 20.0*  MCV 71.9* 70.9* 70.9*  PLT 607* 380 347    Cardiac Enzymes: No results for input(s): "CKTOTAL", "CKMB", "CKMBINDEX", "TROPONINI" in the last 168 hours.  BNP: Invalid input(s): "POCBNP"  CBG: No results for input(s): "GLUCAP" in the last 168 hours.  Microbiology: Results for orders placed or performed during the hospital encounter of 07/15/20  Fungus Culture With Stain     Status: None   Collection Time: 07/15/20  9:08 AM   Specimen: Abscess; Wound  Result Value Ref Range Status   Fungus Stain Final report  Final   Fungus (Mycology) Culture Final report  Final    Comment: (NOTE) Performed At: [facility] [address] [place], [place] [id number] Jolene Schimke MD [id number]    Fungal Source WOUND  Final    Comment: Performed at [facility], [address], [place], [place] [address]  Aerobic/Anaerobic Culture w Gram Stain (surgical/deep wound)     Status: None   Collection Time: 07/15/20  9:08 AM   Specimen: Abscess; Wound  Result Value Ref Range Status   Specimen Description WOUND  Final   Special Requests GALLBLADDER CULTURE SPEC A  Final   Gram Stain   Final    ABUNDANT WBC PRESENT,BOTH PMN AND  MONONUCLEAR ABUNDANT GRAM POSITIVE COCCI IN PAIRS IN CLUSTERS RARE GRAM NEGATIVE COCCOBACILLI RARE GRAM POSITIVE RODS    Culture   Final    ABUNDANT STREPTOCOCCUS CONSTELLATUS NO ANAEROBES ISOLATED Performed at [facility], [address], [place], [place] [address]    Report Status 07/21/2020 FINAL  Final   Organism ID, Bacteria STREPTOCOCCUS CONSTELLATUS  Final      Susceptibility   Streptococcus constellatus - MIC*    PENICILLIN INTERMEDIATE Intermediate     CEFTRIAXONE 1 SENSITIVE Sensitive     ERYTHROMYCIN <=0.12 SENSITIVE Sensitive     LEVOFLOXACIN <=0.25 SENSITIVE Sensitive     VANCOMYCIN 0.5 SENSITIVE Sensitive     * ABUNDANT STREPTOCOCCUS CONSTELLATUS  Fungus Culture Result     Status: None   Collection Time: 07/15/20  9:08 AM  Result Value Ref Range Status   Result 1 Comment  Final    Comment: (NOTE) KOH/Calcofluor preparation:  no fungus observed. Performed At: [facility] [address] [place], [place] [id number] Jolene Schimke MD [id number]   Fungal organism reflex     Status: None   Collection Time: 07/15/20  9:08 AM  Result Value Ref Range Status   Fungal result 1 Comment  Final    Comment: (NOTE) No yeast or mold isolated after 4 weeks. Performed At: [facility] [address] [place], [place] [id number] Jolene Schimke MD [id number]   Culture, blood (routine x 2)     Status: None   Collection Time: 07/17/20  2:53 AM   Specimen: BLOOD  Result Value Ref Range Status   Specimen Description BLOOD RIGHT ANTECUBITAL  Final   Special Requests   Final    BOTTLES DRAWN AEROBIC AND ANAEROBIC Blood Culture adequate volume   Culture   Final    NO GROWTH 5 DAYS Performed at [facility], [address], [place], [place] [address]    Report Status 07/22/2020 FINAL  Final  Culture, blood (routine x 2)     Status: None   Collection Time: 07/17/20  3:01 AM   Specimen: BLOOD RIGHT FOREARM  Result  Value Ref Range Status    Specimen Description BLOOD RIGHT FOREARM  Final   Special Requests AEROBIC BOTTLE ONLY Blood Culture adequate volume  Final   Culture   Final    NO GROWTH 5 DAYS Performed at [facility], [address], [place], [place] [address]    Report Status 07/22/2020 FINAL  Final    Coagulation Studies: No results for input(s): "LABPROT", "INR" in the last 72 hours.  Urinalysis: Recent Labs    06/11/23 0223  COLORURINE YELLOW*  LABSPEC 1.012  PHURINE 5.0  GLUCOSEU NEGATIVE  HGBUR NEGATIVE  BILIRUBINUR NEGATIVE  KETONESUR NEGATIVE  PROTEINUR 30*  NITRITE NEGATIVE  LEUKOCYTESUR NEGATIVE      Imaging: No results found.   Medications:    sodium chloride 75 mL/hr at 06/12/23 1611    Chlorhexidine Gluconate Cloth  6 each Topical Daily   feeding supplement (NEPRO CARB STEADY)  237 mL Oral TID BM   heparin  5,000 Units Subcutaneous Q8H   sodium bicarbonate  650 mg Oral TID   sodium chloride flush  10-40 mL Intracatheter Q12H   ondansetron **OR** ondansetron (ZOFRAN) IV, sodium chloride flush  Assessment/ Plan:  Robin Daniels is a 68 y.o. 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  Acute kidney injury Acute kidney injury likely secondary to Severe ATN. Currently patient is oliguric but hopefully with IV fluid administration, her urine output will improve. Lab Results  Component Value Date   CREATININE 2.71 (H) 06/12/2023   CREATININE 3.09 (H) 06/11/2023   CREATININE 3.71 (H) 06/11/2023       Intake/Output Summary (Last 24 hours) at 06/12/2023 1634 Last data filed at 06/12/2023 1611 Gross per 24 hour  Intake 2570.04 ml  Output 150 ml  Net 2420.04 ml      2.  Severe hypokalemia K 2.7 06/12/2023 Replaced by primary team. Continue to encourage PO intake.   3.  Hyponatremia K 129 06/11/2023, continue Na bicarb D5   4. Hypomagnesemia   Mg  1.7 06/11/2022 improved to 2.7 Replaced by primary team   5.  Acute metabolic acidosis Bicarb 17, continue bicarb gtt 10   6.  Anemia   HgB 8 06/11/2023, significant drop from 10 to 8 Appreciate hem oncology recommendations for anemia management   LOS: 2 Sangeeta Youse Tonny Bollman 3/16/20254:31 PM

## 2023-06-12 NOTE — Progress Notes (Signed)
  PROGRESS NOTE    TRINIKA CORTESE  HYQ:[id number] DOB: 10/23/1955 DOA: 06/10/2023 PCP: Louis Matte, MD  [id number]  LOS: 2 days   Brief hospital course:   Assessment & Plan: TAHNI PORCHIA is a 68 y.o. female with medical history significant of stage IV pancreatic cancer, hypertension, hyperlipidemia presenting with with failure to thrive, AKI.  Patient and family bedside report roughly 2 weeks of decreased p.o. intake, mild nausea.  No abdominal pain.  No diarrhea.  No fevers or chills.  Noted on active chemotherapy in the setting of stage IV pancreatic cancer.  Followed by Dr. Donneta Romberg outpatient.  Has not had chemotherapy in the past week secondary to weakness.    * AKI (acute kidney injury) (HCC) Severe AKI with noted creatinine going from 1-5 with GFR of less than 10 on presentation.  Markedly dry Noted baseline stage IV pancreatic cancer as well as active chemotherapy are confounding issues --cont MIVF as NS  Metabolic Acidosis --start sodium bicarb  Hyponatremia Sodium 126 on presentation --cont MIVF as NS  Hypokalemia --initially resistant to correction by supplementation due to Mag level being still low.  After mag level corrected, potassium level started responding to supplementation.  --monitor and supplement PRN with potassium powder  Hypomag --monitor and supplement with IV mag  Diarrhea --present for a while now, likely related to chemo.  Likely exacerbated the dehydration and electrolyte abnormalities.   --discuss with onc about possibility of using Imdoium  Cancer of head of pancreas (HCC) Baseline stage IV pancreatic cancer followed by Dr. Guy Sandifer outpatient On chemotherapy Noted concurrent failure to thrive in the setting of acute kidney injury today Prior hospice evaluation outpatient palliative care consulted on admission  HTN --BP soft.   --Hold home BP meds  HLD (hyperlipidemia) Continue statin  Chronic anemia likely due to chronic  illness --Hgb 10.1 on presentation, decreased to 8.0 next morning, likely dilutional. --anemia workup showed no def.   DVT prophylaxis: Heparin SQ Code Status: Full code  Family Communication: family updated at bedside today Level of care: Telemetry Cardiac Dispo:   The patient is from: home Anticipated d/c is to: home Anticipated d/c date is: Tuesday   Subjective and Interval History:  Pt reported feeling better.  Reported having diarrhea for a while now.     Objective: Vitals:   06/12/23 0030 06/12/23 0356 06/12/23 0817 06/12/23 1136  BP: (!) 99/57 (!) 105/55 (!) 100/53 100/60  Pulse: 86 82 84 82  Resp: 17 19 18 18   Temp: 97.7 F (36.5 C) 97.7 F (36.5 C) 97.9 F (36.6 C) 97.8 F (36.6 C)  TempSrc: Oral Oral  Oral  SpO2: 98% 98% 100% 100%  Weight:      Height:        Intake/Output Summary (Last 24 hours) at 06/12/2023 1448 Last data filed at 06/12/2023 0530 Gross per 24 hour  Intake 1224.4 ml  Output 150 ml  Net 1074.4 ml   Filed Weights   06/10/23 0846 06/11/23 2108  Weight: 67 kg 66.2 kg    Examination:   Constitutional: NAD, AAOx3 HEENT: conjunctivae and lids normal, EOMI CV: No cyanosis.   RESP: normal respiratory effort, on RA Neuro: II - XII grossly intact.   Psych: Normal mood and affect.  Appropriate judgement and reason   Data Reviewed: I have personally reviewed labs and imaging studies  Time spent: 50 minutes  Darlin Priestly, MD [facility] If 7PM-7AM, please contact night-coverage 06/12/2023, 2:48 PM

## 2023-06-12 NOTE — Consult Note (Signed)
 Consultation Note Date: 06/12/2023   Patient Name: Robin Daniels  DOB: 1956/03/13  MRN: [id number]  Age / Sex: 68 y.o., female  PCP: Entzminger, Danton Clap, MD Referring Physician: Darlin Priestly, MD  Reason for Consultation: Establishing goals of care   HPI/Brief Hospital Course: 68 y.o. female  with past medical history of stage IV pancreatic cancer on chemotherapy, chemo induced peripheral neuropathy, HTN and HLD admitted from home on 06/10/2023 with FTT, significant AKI and severe hyponatremia.  In ED creatinine 5, bicarb 10, potassium less than 2-started on bicarb drip and potassium being replaced, nephrology consulted  Noted last oncology visit  2/26 and received chemo treatment same day, missed last scheduled chemo infusion due to weakness Seen by outpatient palliative care provider 3/5  Palliative medicine was consulted for assisting with goals of care conversations.  Subjective:  Extensive chart review has been completed prior to meeting patient including labs, vital signs, imaging, progress notes, orders, and available advanced directive documents from current and previous encounters.  Visited with Robin Daniels at her bedside. She is being assisted back to bed by nursing staff from bathroom, requires minimal assistance with transfer, ambulates short distance with walker. Daughter Nintisha at bedside during visit.  Introduced myself as a Publishing rights manager as a member of the palliative care team. Explained palliative medicine is specialized medical care for people living with serious illness. It focuses on providing relief from the symptoms and stress of a serious illness. The goal is to improve quality of life for both the patient and the family.   Robin Daniels shares she can recall outpatient palliative visits, aware she was prescribed duloxetine/gabapentin for neuropathy pain, Robin Daniels shares she has never started those medications due to fear and does not  feel her pain requires medication. She shares she has numbness in bilateral lower legs, does not interfere with ambulation and does not cause her pain or discomfort.  Robin Daniels shares she lives in a home with her son and her mother. She serves as her mother's primary caregiver whom is blind. Robin Daniels shares over the last month she has been unable to care for her mother. She has had extreme weakness and fatigue and lack of appetite. She shares she was recently treated for mucositis which is now resolved but appetite has remained poor.  Robin Daniels and daughter able to share their understanding of current medical condition. Aware of electrolyte abnormalities and AKI.  We discussed patient's current illness and what it means in the larger context of patient's on-going co-morbidities. Natural disease trajectory and expectations at EOL were discussed.   Robin Daniels shares she has not completed Advanced Directives in the past, blue booklet provided by chaplin as she has expressed interest in completing while admitted. We discussed the role of HCPOA and importance of Living Will.  Attempted to elicit goals of care. We discussed risk versus benefit of all treatments/interventions moving forward and taking into consideration side effects of all treatments/interventions. We discussed code status and the difference between Full Code and Do Not Resuscitate. Encouraged patient/family to consider DNR/DNI status understanding evidenced based poor outcomes in similar hospitalized patients, as the cause of the arrest is likely associated with chronic/terminal disease rather than a reversible acute cardio-pulmonary event.  Robin Daniels shares she has always though he desire would be Full Code but after explanation she is leaning towards considering DNR but would like the opportunity to discuss with her family.  I discussed importance of continued conversations with family/support persons  and all members of their medical team  regarding overall plan of care and treatment options ensuring decisions are in alignment with patients goals of care.  All questions/concerns addressed. Emotional support provided to patient/family/support persons. PMT will continue to follow and support patient as needed.  Objective: Primary Diagnoses: Present on Admission:  AKI (acute kidney injury) (HCC)  Cancer of head of pancreas (HCC)  HLD (hyperlipidemia)  Hypokalemia  Hypertension goal BP (blood pressure) < 140/90   Physical Exam Constitutional:      General: She is not in acute distress.    Appearance: She is ill-appearing.  Pulmonary:     Effort: Pulmonary effort is normal. No respiratory distress.  Skin:    General: Skin is warm and dry.  Neurological:     Mental Status: She is alert and oriented to person, place, and time.     Motor: Weakness present.     Vital Signs: BP 116/61 (BP Location: Left Arm)   Pulse 78   Temp 97.9 F (36.6 C)   Resp 18   Ht 5\' 4"  (1.626 m)   Wt 66.2 kg   LMP  (LMP Unknown)   SpO2 100%   BMI 25.05 kg/m  Pain Scale: 0-10   Pain Score: 0-No pain  IO: Intake/output summary:  Intake/Output Summary (Last 24 hours) at 06/12/2023 1642 Last data filed at 06/12/2023 1611 Gross per 24 hour  Intake 2570.04 ml  Output 150 ml  Net 2420.04 ml    LBM: Last BM Date : 06/12/23 Baseline Weight: Weight: 67 kg Most recent weight: Weight: 66.2 kg      Assessment and Plan  SUMMARY OF RECOMMENDATIONS   Full Code/Full Scope Ongoing GOC needed  Palliative Prophylaxis:   Bowel Regimen, Delirium Protocol and Frequent Pain Assessment   Thank you for this consult and allowing Palliative Medicine to participate in the care of Lee C. Carlena Sax. Palliative medicine will continue to follow and assist as needed.   Time Total: 75 minutes  Time spent includes: Detailed review of medical records (labs, imaging, vital signs), medically appropriate exam (mental status, respiratory, cardiac, skin),  discussed with treatment team, counseling and educating patient, family and staff, documenting clinical information, medication management and coordination of care.   Signed by: Leeanne Deed, DNP, AGNP-C Palliative Medicine    Please contact Palliative Medicine Team phone at [phone] for questions and concerns.  For individual provider: See Loretha Stapler

## 2023-06-12 NOTE — Progress Notes (Addendum)
 CRITICAL VALUE STICKER  CRITICAL VALUE: k+ [phone]   RECEIVER (on-site recipient of call): Toniann Ket, RN  DATE & TIME NOTIFIED: 06/12/23 3:35 AM  MESSENGER (representative from lab): Arlys John  MD NOTIFIED: Manuela Schwartz, NP   TIME OF NOTIFICATION: 3:35 AM  RESPONSE: see new orders; k+ replacement IV and PO

## 2023-06-13 ENCOUNTER — Encounter: Payer: Self-pay | Admitting: Family Medicine

## 2023-06-13 DIAGNOSIS — N179 Acute kidney failure, unspecified: Secondary | ICD-10-CM | POA: Diagnosis not present

## 2023-06-13 DIAGNOSIS — Z515 Encounter for palliative care: Secondary | ICD-10-CM | POA: Diagnosis not present

## 2023-06-13 DIAGNOSIS — Z7189 Other specified counseling: Secondary | ICD-10-CM

## 2023-06-13 HISTORY — DX: Encounter for palliative care: Z51.5

## 2023-06-13 LAB — BASIC METABOLIC PANEL
Anion gap: 6 (ref 5–15)
BUN: 45 mg/dL — ABNORMAL HIGH (ref 8–23)
CO2: 14 mmol/L — ABNORMAL LOW (ref 22–32)
Calcium: 7.7 mg/dL — ABNORMAL LOW (ref 8.9–10.3)
Chloride: 113 mmol/L — ABNORMAL HIGH (ref 98–111)
Creatinine, Ser: 1.74 mg/dL — ABNORMAL HIGH (ref 0.44–1.00)
GFR, Estimated: 31 mL/min — ABNORMAL LOW (ref >60)
Glucose, Bld: 94 mg/dL (ref 70–99)
Potassium: 2.8 mmol/L — ABNORMAL LOW (ref 3.5–5.1)
Sodium: 133 mmol/L — ABNORMAL LOW (ref 135–145)

## 2023-06-13 LAB — CBC
HCT: 18.8 % — ABNORMAL LOW (ref 36.0–46.0)
Hemoglobin: 6.6 g/dL — ABNORMAL LOW (ref 12.0–15.0)
MCH: 26 pg (ref 26.0–34.0)
MCHC: 35.1 g/dL (ref 30.0–36.0)
MCV: 74 fL — ABNORMAL LOW (ref 80.0–100.0)
Platelets: 327 10*3/uL (ref 150–400)
RBC: 2.54 MIL/uL — ABNORMAL LOW (ref 3.87–5.11)
RDW: 18.7 % — ABNORMAL HIGH (ref 11.5–15.5)
WBC: 8.9 10*3/uL (ref 4.0–10.5)
nRBC: 0 % (ref 0.0–0.2)

## 2023-06-13 LAB — PHOSPHORUS
Phosphorus: 2.1 mg/dL — ABNORMAL LOW (ref 2.5–4.6)
Phosphorus: 5.5 mg/dL — ABNORMAL HIGH (ref 2.5–4.6)

## 2023-06-13 LAB — BLOOD GAS, VENOUS
Bicarbonate: 23 mmol/L (ref 20.0–28.0)
Patient temperature: 37 mmol/L (ref 0.0–2.0)
pCO2, Ven: 38 mmHg — ABNORMAL LOW (ref 44–60)
pH, Ven: 7.39 (ref 7.25–7.43)
pO2, Ven: 23 mmol/L (ref 32–45)

## 2023-06-13 LAB — MAGNESIUM: Magnesium: 2.2 mg/dL (ref 1.7–2.4)

## 2023-06-13 LAB — PREPARE RBC (CROSSMATCH)

## 2023-06-13 LAB — POTASSIUM: Potassium: 3.1 mmol/L — ABNORMAL LOW (ref 3.5–5.1)

## 2023-06-13 MED ORDER — POTASSIUM CHLORIDE 20 MEQ PO PACK
40.0000 meq | PACK | ORAL | Status: AC
  Administered 2023-06-13 – 2023-06-14 (×2): 40 meq via ORAL
  Filled 2023-06-13 (×2): qty 2

## 2023-06-13 MED ORDER — SODIUM CHLORIDE 0.9% IV SOLUTION: Freq: Once | INTRAVENOUS | Status: AC

## 2023-06-13 MED ORDER — SODIUM CHLORIDE 0.9 % IV SOLN
50.0000 ug/h | INTRAVENOUS | Status: DC
  Administered 2023-06-13 – 2023-06-14 (×3): 50 ug/h via INTRAVENOUS
  Filled 2023-06-13 (×4): qty 1

## 2023-06-13 MED ORDER — POTASSIUM PHOSPHATES 15 MMOLE/5ML IV SOLN
30.0000 mmol | Freq: Once | INTRAVENOUS | Status: AC
  Administered 2023-06-13: 30 mmol via INTRAVENOUS
  Filled 2023-06-13: qty 10

## 2023-06-13 MED ORDER — POTASSIUM CHLORIDE 20 MEQ PO PACK
40.0000 meq | PACK | ORAL | Status: AC
  Administered 2023-06-13 (×2): 40 meq via ORAL
  Filled 2023-06-13 (×2): qty 2

## 2023-06-13 MED ORDER — STERILE WATER FOR INJECTION IV SOLN
INTRAVENOUS | Status: AC
  Filled 2023-06-13 (×2): qty 150

## 2023-06-13 NOTE — Consult Note (Signed)
 Palliative Medicine [facility] at [facility] Telephone:([phone] [phone]   Name: Robin SUNGA Date: 06/13/2023 MRN: [id number]  DOB: [birth year]  Patient Care Team: Louis Matte, MD as PCP - General (Internal Medicine) Toy Cookey, FNP (Family Medicine) Jim Like, RN as Registered Nurse Scarlett Presto, RN (Inactive) as Registered Nurse Benita Gutter, RN as Oncology Nurse Navigator Almond Lint, MD as Consulting Physician (General Surgery) Earna Coder, MD as Consulting Physician (Internal Medicine)    REASON FOR CONSULTATION: Robin Daniels is a 68 y.o. female with multiple medical problems including stage IV pancreatic cancer on second line chemotherapy.  Patient admitted with AKI, diarrhea, anemia, and severe electrolyte derangements.  Palliative care consulted to address goals.  SOCIAL HISTORY:     reports that she has never smoked. She has never used smokeless tobacco. She reports that she does not drink alcohol and does not use drugs.  Patient divorced.  She lives at home with her mother and son.  Patient also has a daughter and multiple siblings who are involved.  Patient retired from [employer] where she worked in [employer].  ADVANCE DIRECTIVES:  Does not have  CODE STATUS: Full code  PAST MEDICAL HISTORY: 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/04/2019   Complication of anesthesia    Fluttering heart    Hyperlipidemia    Hypertension    PONV (postoperative nausea and vomiting)    Pre-diabetes    Vertigo    none recently    PAST SURGICAL HISTORY:  Past Surgical History:  Procedure Laterality Date   CATARACT EXTRACTION W/PHACO Right 06/09/2021   Procedure: CATARACT EXTRACTION PHACO AND INTRAOCULAR LENS PLACEMENT (IOC) RIGHT malyugin;  Surgeon: Robin Manila, MD;  Location:  [facility];  Service: Ophthalmology;  Laterality: Right;  12.40 1:07.0   COLONOSCOPY WITH PROPOFOL N/A 01/13/2021   Procedure: COLONOSCOPY WITH PROPOFOL;  Surgeon: Wyline Mood, MD;  Location: [facility] END[facility]COPY;  Service: Gastroenterology;  Laterality: N/A;   ENDOSCOPIC RETROGRADE CHOLANGIOPANCREATOGRAPHY (ERCP) WITH PROPOFOL N/A 11/16/2019   Procedure: ENDOSCOPIC RETROGRADE CHOLANGIOPANCREATOGRAPHY (ERCP) WITH PROPOFOL;  Surgeon: Robin Minium, MD;  Location: [facility] ENDOSCOPY;  Service: Endoscopy;  Laterality: N/A;   ERCP N/A 03/27/2020   Procedure: ENDOSCOPIC RETROGRADE CHOLANGIOPANCREATOGRAPHY (ERCP);  Surgeon: Robin Minium, MD;  Location: [facility] ENDOSCOPY;  Service: Endoscopy;  Laterality: N/A;   EUS N/A 11/29/2019   Procedure: FULL UPPER ENDOSCOPIC ULTRASOUND (EUS) RADIAL;  Surgeon: Robin Custard, MD;  Location: [facility] ENDOSCOPY;  Service: Gastroenterology;  Laterality: N/A;   IR FLUORO RM 30-60 MIN  07/30/2020   IR IMAGING GUIDED PORT INSERTION  12/14/2019   JEJUNOSTOMY Left 07/15/2020   Procedure: Rance Muir;  Surgeon: Almond Lint, MD;  Location: [facility] OR;  Service: General;  Laterality: Left;   LAPAROSCOPY N/A 07/15/2020   Procedure: DIAGNOSTIC LAPAROSCOPY;  Surgeon: Almond Lint, MD;  Location: [facility] OR;  Service: General;  Laterality: N/A;   right knee replacement Right [id number]   SHOULDER ARTHROSCOPY W/ ROTATOR CUFF REPAIR Right    WHIPPLE PROCEDURE N/A 07/15/2020   Procedure: WHIPPLE PROCEDURE;  Surgeon: Almond Lint, MD;  Location: [facility] OR;  Service: General;  Laterality: N/A;    HEMATOLOGY/ONCOLOGY HISTORY:  Oncology History Overview Note  #Pancreas adenocarcinoma-Stage IB- [id number]-[EUS- Dr.Spaete; [facility]/GI; mass 22x76mm; invading/abutting superior mesenteric vein; 2 enlarged lymph nodes peripancreatic/porta hepatis  largest 10 x 5.8 mm-nonpathologic based on EUS criteria/no biopsy]-borderline resectable.  PET scan no evidence of distant metastatic disease.  #Biliary obstruction status  post ERCP and stenting [Dr.Wohl]-  # SEP, 22,2021- GEM-ABRAXANE s/p cycle 1-d1 [discontinued secondary to shortage]; s/p evaluation with Dr. Fredirick Maudlin September]  # 01/02/2020-FOLFIRINOX; Neulasta. S/p FOLFIRINOX 10 cycles- April 19th whipples-  s/p neoadjuvant FOLFIRINX. #10 cycles]-s/p Whipple's [on April 19th 2022]- ypT2 [3.5cm]; pN0/11;   DIS-CONTINUED  FOLFIRINOX cycle #11 & 12-  Sec to PN-2-3.  PANCREAS (EXOCRINE), CARCINOMA: Resection  Procedure: Whipple procedure  Tumor Site: Head of pancreas.  Tumor Size: 3.5 cm, slide measurement.  Histologic Type: Pancreatic ductal adenocarcinoma.  Histologic Grade: Moderately differentiated.  Tumor Extension: Into peripancreatic connective tissue.  Treatment Effect: Moderate treatment effect.  Lymphovascular Invasion: Not identified.  Perineural Invasion: Present.  Margins: All surgical margins are negative for carcinoma.  Regional Lymph Nodes:       Number of Lymph Nodes with Tumor: 0       Number of Lymph Nodes Examined: 15  Distant Metastasis:       Distant Site(s) Involved: Not applicable.  Pathologic Stage Classification (pTNM, AJCC 8th Edition): ypT2, ypN0   # Borderline DM; HTN   AUG 18th, 2024- PET scan- Whipple procedure with abnormal hypermetabolism centered at the pancreaticojejunostomy, worrisome for disease recurrence. Associated pancreaticojejunostomy stent in place; Hypermetabolic adjacent retroperitoneal lymph nodes, worrisome for metastatic disease. Ca 19-9 rising.    SEP 3rd, 2024- single gem [3 weeks on 1 week off]-because of neuropathy.  # OCT 29th, 2024-CT scan abdomen pelvis-progression.  # DEC 2024- start NALIRI.     Cancer of head of pancreas (HCC)  12/04/2019 Initial Diagnosis   Cancer of head of pancreas (HCC)   12/19/2019 - 12/19/2019 Chemotherapy   The patient had PACLitaxel-protein bound (ABRAXANE) chemo infusion 250 mg, 125 mg/m2 = 250 mg, Intravenous,  Once, 1 of 4 cycles Administration: 250 mg  (12/19/2019) gemcitabine (GEMZAR) 2,000 mg in sodium chloride 0.9 % 250 mL chemo infusion, 1,976 mg, Intravenous,  Once, 1 of 4 cycles Administration: 2,000 mg (12/19/2019)  for chemotherapy treatment.    01/02/2020 - 05/15/2020 Chemotherapy   Patient is on Treatment Plan : PANCREAS Modified FOLFIRINOX q14d x 4 cycles     11/30/2022 - 02/08/2023 Chemotherapy   Patient is on Treatment Plan : PANCREAS Gemcitabine D1,8, (1000) q21d     03/14/2023 -  Chemotherapy   Patient is on Treatment Plan : PANCREAS Liposomal Irinotecan + Leucovorin + 5-FU IVCI q14d       ALLERGIES:  has no known allergies.  MEDICATIONS:  Current Facility-Administered Medications  Medication Dose Route Frequency Provider Last Rate Last Admin   Chlorhexidine Gluconate Cloth 2 % PADS 6 each  6 each Topical Daily Floydene Flock, MD   6 each at 06/12/23 1058   feeding supplement (NEPRO CARB STEADY) liquid 237 mL  237 mL Oral TID BM Manuela Schwartz, NP   237 mL at 06/13/23 1028   heparin injection 5,000 Units  5,000 Units Subcutaneous Q8H Floydene Flock, MD   5,000 Units at 06/13/23 1318   octreotide (SANDOSTATIN) 500 mcg in sodium chloride 0.9 % 250 mL (2 mcg/mL) infusion  50 mcg/hr Intravenous Continuous Earna Coder, MD       ondansetron [facility]) tablet 4 mg  4 mg Oral Q6H PRN Floydene Flock, MD       Or   ondansetron [facility]) injection 4 mg  4 mg Intravenous Q6H PRN  Floydene Flock, MD   4 mg at 06/12/23 2008   potassium PHOSPHATE 30 mmol in dextrose 5 % 500 mL infusion  30 mmol Intravenous Once Darlin Priestly, MD 85 mL/hr at 06/13/23 1027 30 mmol at 06/13/23 1027   sodium bicarbonate 150 mEq in sterile water 1,150 mL infusion   Intravenous Continuous Darlin Priestly, MD 75 mL/hr at 06/13/23 1332 New Bag at 06/13/23 1332   sodium bicarbonate tablet 650 mg  650 mg Oral TID Darlin Priestly, MD   650 mg at 06/13/23 0850   sodium chloride flush (NS) 0.9 % injection 10-40 mL  10-40 mL Intracatheter Q12H Floydene Flock, MD   20  mL at 06/13/23 7253   sodium chloride flush (NS) 0.9 % injection 10-40 mL  10-40 mL Intracatheter PRN Floydene Flock, MD       Facility-Administered Medications Ordered in Other Encounters  Medication Dose Route Frequency Provider Last Rate Last Admin   sodium chloride flush (NS) 0.9 % injection 10 mL  10 mL Intravenous PRN Louretta Shorten R, MD   10 mL at 06/23/21 1012   sodium chloride flush (NS) 0.9 % injection 10 mL  10 mL Intracatheter PRN Earna Coder, MD   10 mL at 03/16/23 1311    VITAL SIGNS: BP 115/65   Pulse 83   Temp 97.8 F (36.6 C) (Oral)   Resp 18   Ht 5\' 4"  (1.626 m)   Wt 145 lb 15.1 oz (66.2 kg)   LMP  (LMP Unknown)   SpO2 100%   BMI 25.05 kg/m  Filed Weights   06/10/23 0846 06/11/23 2108  Weight: 147 lb 11.3 oz (67 kg) 145 lb 15.1 oz (66.2 kg)    Estimated body mass index is 25.05 kg/m as calculated from the following:   Height as of this encounter: 5\' 4"  (1.626 m).   Weight as of this encounter: 145 lb 15.1 oz (66.2 kg).  LABS: CBC:    Component Value Date/Time   WBC 8.9 06/13/2023 0530   HGB 6.6 (L) 06/13/2023 0530   HGB 8.6 (L) 05/25/2023 0830   HCT 18.8 (L) 06/13/2023 0530   PLT 327 06/13/2023 0530   PLT 320 05/25/2023 0830   MCV 74.0 (L) 06/13/2023 0530   NEUTROABS 6.4 06/10/2023 0849   LYMPHSABS 2.0 06/10/2023 0849   MONOABS 0.7 06/10/2023 0849   EOSABS 0.1 06/10/2023 0849   BASOSABS 0.1 06/10/2023 0849   Comprehensive Metabolic Panel:    Component Value Date/Time   NA 133 (L) 06/13/2023 0530   K 2.8 (L) 06/13/2023 0530   CL 113 (H) 06/13/2023 0530   CO2 14 (L) 06/13/2023 0530   BUN 45 (H) 06/13/2023 0530   CREATININE 1.74 (H) 06/13/2023 0530   CREATININE 1.00 05/25/2023 0830   GLUCOSE 94 06/13/2023 0530   CALCIUM 7.7 (L) 06/13/2023 0530   AST 21 06/11/2023 0450   AST 22 05/25/2023 0830   ALT 22 06/11/2023 0450   ALT 17 05/25/2023 0830   ALKPHOS 93 06/11/2023 0450   BILITOT 0.6 06/11/2023 0450   BILITOT 0.6  05/25/2023 0830   PROT 6.0 (L) 06/11/2023 0450   ALBUMIN 2.7 (L) 06/11/2023 2004    RADIOGRAPHIC STUDIES: CT ABDOMEN PELVIS WO CONTRAST Result Date: 06/10/2023 CLINICAL DATA:  68 year old female with a history of abdominal pain EXAM: CT ABDOMEN AND PELVIS WITHOUT CONTRAST TECHNIQUE: Multidetector CT imaging of the abdomen and pelvis was performed following the standard protocol without IV contrast. RADIATION DOSE REDUCTION: This  exam was performed according to the departmental dose-optimization program which includes automated exposure control, adjustment of the mA and/or kV according to patient size and/or use of iterative reconstruction technique. COMPARISON:  CT 01/25/2023, PET-CT 05/24/2023 FINDINGS: Lower chest: Mild centrilobular nodularity within the medial right lower lobe Hepatobiliary: Pneumobilia. Focal 14 mm hypodensity the lower liver, poorly characterized on the noncontrast CT though appears new from the prior PET. Redemonstration cholecystectomy, pancreatico duodenectomy. Unchanged position of the stent at the biliary anastomosis. The surgical site is poorly characterized with the absence of contrast. Pancreas: Surgical changes of pancreatico duodenectomy, stent at the anastomosis. Surgical site is poorly characterized with the absence of contrast. Spleen: Unremarkable Adrenals/Urinary Tract: - Right adrenal gland:  Unremarkable - Left adrenal gland: Unremarkable. - Right kidney: No hydronephrosis, nephrolithiasis, inflammation, or ureteral dilation. - Left Kidney: No hydronephrosis, nephrolithiasis, inflammation, or ureteral dilation. - Urinary Bladder: Unremarkable. Stomach/Bowel: Redemonstration surgical changes at the stomach, surgical changes of pancreatico duodenectomy. Relatively unremarkable small bowel. The length of the colon is fluid-filled, without wall thickening or significant focal inflammatory changes. No evidence of obstruction as the fluid extends through the rectum.  Vascular/Lymphatic: Atherosclerotic changes of the abdominal aorta, mesenteric arteries, renal arteries, iliac arteries. Small mesenteric lymph nodes nonspecific. Reproductive: Unremarkable uterus/adnexa Other: Periumbilical fat. Musculoskeletal: No acute displaced fracture. Degenerative changes of the thoracolumbar spine. IMPRESSION: The length of colon is fluid-filled, nonspecific finding though potentially representing enteritis and/or colitis. No evidence of obstruction, and no focal inflammation. Redemonstration of surgical changes of pancreatico duodenectomy. The surgical site is poorly characterized with the absence of contrast. Pattern of centrilobular nodularity in the medial right lower lobe compatible with infectious/inflammatory change. **An incidental finding of potential clinical significance has been found. Focal hypodensity of the right liver measuring 14 mm. A focal liver lesion cannot be excluded and in the setting of the patient's known prior pancreatic cancer, follow-up with oncology is recommended to determine further imaging strategy such as contrast MRI or CT.** Aortic Atherosclerosis (ICD10-I70.0). Additional ancillary findings as above Electronically Signed   By: Gilmer Mor D.O.   On: 06/10/2023 15:57   DG Chest 2 View Result Date: 06/10/2023 CLINICAL DATA:  Lethargy, failure to thrive. EXAM: CHEST - 2 VIEW COMPARISON:  July 15, 2020.  May 24, 2023. FINDINGS: The heart size and mediastinal contours are within normal limits. 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 No definite consolidative process is noted. Right internal jugular Port-A-Cath is unchanged. The visualized skeletal structures are unremarkable. IMPRESSION: No active cardiopulmonary disease. Electronically Signed   By: Lupita Raider M.D.   On: 06/10/2023 11:03   NM PET Image Restage (PS) Skull Base to Thigh (F-18 FDG) Result Date: 06/08/2023 CLINICAL DATA:   Subsequent treatment strategy for pancreatic cancer. EXAM: NUCLEAR MEDICINE PET SKULL BASE TO THIGH TECHNIQUE: 7.83 mCi F-18 FDG was injected intravenously. Full-ring PET imaging was performed from the skull base to thigh after the radiotracer. CT data was obtained and used for attenuation correction and anatomic localization. Fasting blood glucose: 100 mg/dl COMPARISON:  Prior PET CTs 02/25/2023 and 11/09/2022. FINDINGS: Mediastinal blood pool activity: SUV max 2.99 Liver activity: SUV max NA NECK: No hypermetabolic lymph nodes in the neck. There is a small focus of hypermetabolism in the left thyroid lobe. This corresponds to an 8 mm nodule on the prior CT scan. Recommend thyroid US (ref: J Am Coll Radiol. 2015 Feb;12(2): 143-50). Incidental CT findings: Bilateral carotid artery calcifications. CHEST:  No hypermetabolic mediastinal or hilar nodes. No suspicious pulmonary nodules on the CT scan. No hypermetabolic breast masses, supraclavicular or axillary adenopathy. Incidental CT findings: Stable atherosclerotic calcifications involving the aorta and coronary arteries. 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 ABDOMEN/PELVIS: Persistent focus of hypermetabolism noted near the anastomosis and surrounding the vascular clips or sutures posterior to the pancreatic duct drain. SUV max is 8.98. This area was smaller and had an SUV max of 8.36 on the prior PET-CT. Second hypermetabolic focus just to the left of the celiac axis has an SUV max of 4.24. This area was much larger on the prior study and had an SUV max of 7.30. On the prior study there was a third focus of hypermetabolism chest to the left of the SMA which corresponded to a small lymph node. Do not see any measurable lymph node or any residual hypermetabolism on today study. Persistent hypermetabolism associated with the antral region of the stomach possibly related to reflux from the Whipple procedure. No new hypermetabolic foci to suggest hepatic  metastatic disease. No omental or peritoneal implants or new areas of abdominal or pelvic adenopathy. Incidental CT findings: Stable surgical changes. The pancreatic duct stent appears stable. Minimal scattered atherosclerotic calcifications. Stable periumbilical abdominal wall hernia containing fat. No free fluid. SKELETON: No findings suspicious for osseous metastatic disease. Incidental CT findings: None. IMPRESSION: 1. Persistent focus of hypermetabolism (8.98 versus previous 8.36) near the anastomosis and surrounding the vascular clips or sutures posterior to the pancreatic duct drain. This area appears larger. 2. Second hypermetabolic focus just to the left of the celiac axis has an SUV max of 4.24. This area was much larger on the prior study and had an SUV max of 7.30. 3. On the prior study there was a third focus of hypermetabolism just to the left of the SMA which corresponded to a small lymph node. I do not see any measurable lymph node or any residual hypermetabolism on today study. 4. No findings to suggest hepatic metastatic disease. 5. No findings for metastatic disease involving the chest or pelvis. 6. Small focus of hypermetabolism in the left thyroid lobe. This corresponds to an 8 mm nodule on the prior CT scan. Recommend thyroid US. 7. Aortic atherosclerosis. Electronically Signed   By: Rudie Meyer M.D.   On: 06/08/2023 17:19    PERFORMANCE STATUS (ECOG) : 3 - Symptomatic, >50% confined to bed  Review of Systems Unless otherwise noted, a complete review of systems is negative.  Physical Exam General: NAD Cardiovascular: regular rate and rhythm Pulmonary: clear ant fields Abdomen: soft, nontender, + bowel sounds GU: no suprapubic tenderness Extremities: no edema, no joint deformities Skin: no rashes Neurological: Weakness but otherwise nonfocal  IMPRESSION: Dr. Donneta Romberg and I met with patient, daughter, and son.  Unfortunately, patient has had rising tumor markers and CT is  suggestive of possible new hepatic metastasis.  Patient also appears to be poorly tolerating chemotherapy and further treatment options are limited.  Patient is hopeful that she will have other options for cancer treatment.  She is interested in exploring possible clinical trials available at tertiary centers.  I did speak with patient and family about the option of hospice if there are no other options for treatment available.  We can continue these conversations in the outpatient setting.  CODE STATUS was discussed including the probable futility associated with resuscitative efforts in the setting of a terminal malignancy.  Patient states that she has not given much thought to  her end-of-life care but agrees to do so and also to speak with family about decision making.  PLAN: -Continue current scope of treatment -Will plan outpatient follow-up  Case and plan discussed with Dr. Donneta Romberg  Time Total: 45 minutes  Visit consisted of counseling and education dealing with the complex and emotionally intense issues of symptom management and palliative care in the setting of serious and potentially life-threatening illness.Greater than 50%  of this time was spent counseling and coordinating care related to the above assessment and plan.  Signed by: Laurette Schimke, PhD, NP-C

## 2023-06-13 NOTE — Progress Notes (Signed)
 PROGRESS NOTE    Robin Daniels  EXB:[id number] DOB: 28-Feb-1956 DOA: 06/10/2023 PCP: Louis Matte, MD  [id number]  LOS: 3 days   Brief hospital course:   Assessment & Plan: Robin Daniels is a 68 y.o. female with medical history significant of stage IV pancreatic cancer, hypertension, hyperlipidemia presenting with with failure to thrive, AKI.  Patient and family bedside report roughly 2 weeks of decreased p.o. intake, mild nausea.  No abdominal pain.  No diarrhea.  No fevers or chills.  Noted on active chemotherapy in the setting of stage IV pancreatic cancer.  Followed by Dr. Donneta Romberg outpatient.  Has not had chemotherapy in the past week secondary to weakness.    * AKI (acute kidney injury) (HCC) Severe AKI with noted creatinine going from 1-5 with GFR of less than 10 on presentation.  Markedly dry Noted baseline stage IV pancreatic cancer as well as active chemotherapy are confounding issues --cont MIVF as NaBicarb gtt, per nephro rec  Metabolic Acidosis --cont oral sodium bicarb (new) --cont MIVF as NaBicarb gtt, per nephro rec  Hyponatremia Sodium 126 on presentation --cont MIVF as NaBicarb gtt, per nephro rec  Hypokalemia --initially resistant to correction by supplementation due to Mag level being still low.  After mag level corrected, potassium level started responding to supplementation.  --monitor and supplement PRN with potassium powder  Hypomag --monitor and supplement with IV mag  Diarrhea --present for a while now, likely related to chemo.  Likely exacerbated the dehydration and electrolyte abnormalities.   --start octreotide, per onc  Cancer of head of pancreas (HCC) Baseline stage IV pancreatic cancer followed by Dr. Guy Sandifer outpatient On chemotherapy Noted concurrent failure to thrive in the setting of acute kidney injury today Prior hospice evaluation outpatient --onc consult today with Dr. Althea Charon  HTN --BP soft.   --Hold home BP  meds  HLD (hyperlipidemia) Continue statin  Chronic anemia likely due to chronic illness --Hgb 10.1 on presentation, has been decreasing --anemia workup showed no def. --1u pRBC today for Hgb 6.6   DVT prophylaxis: Heparin SQ Code Status: Full code  Family Communication:  Level of care: Telemetry Cardiac Dispo:   The patient is from: home Anticipated d/c is to: home Anticipated d/c date is: 2-3 days   Subjective and Interval History:  Pt's appetite improved.  Still having diarrhea.   Objective: Vitals:   06/13/23 1034 06/13/23 1142 06/13/23 1316 06/13/23 1506  BP: (!) 101/57 (!) 99/55 115/65 (!) 99/56  Pulse: 81 76 83 76  Resp:  18  18  Temp: 97.9 F (36.6 C) 97.8 F (36.6 C) 97.8 F (36.6 C) 97.8 F (36.6 C)  TempSrc: Oral Oral Oral   SpO2: 100% 100% 100% 100%  Weight:      Height:        Intake/Output Summary (Last 24 hours) at 06/13/2023 1718 Last data filed at 06/13/2023 1500 Gross per 24 hour  Intake 948 ml  Output 452 ml  Net 496 ml   Filed Weights   06/10/23 0846 06/11/23 2108  Weight: 67 kg 66.2 kg    Examination:   Constitutional: NAD, AAOx3 HEENT: conjunctivae and lids normal, EOMI CV: No cyanosis.   RESP: normal respiratory effort, on RA Neuro: II - XII grossly intact.   Psych: Normal mood and affect.  Appropriate judgement and reason   Data Reviewed: I have personally reviewed labs and imaging studies  Time spent: 35 minutes  Robin Priestly, MD [facility] If 7PM-7AM, please contact night-coverage  06/13/2023, 5:18 PM

## 2023-06-13 NOTE — Progress Notes (Signed)
   06/13/23 1015  Spiritual Encounters  Type of Visit Follow up  Care provided to: Pt and family  Referral source Chaplain team  Reason for visit Advance directives (Stopped by to discuss and assist Pt w/AD paperwork. Pt declined at this time.)  OnCall Visit No  Interventions  Spiritual Care Interventions Made Established relationship of care and support;Compassionate presence  Intervention Outcomes  Outcomes Connection to spiritual care;Awareness of support  Spiritual Care Plan  Spiritual Care Issues Still Outstanding Additional issues (comment) (Chaplain services remain available as the need arises.)

## 2023-06-13 NOTE — Consult Note (Addendum)
 [facility] CONSULT NOTE  Patient Care Team: Entzminger, Danton Clap, MD as PCP - General (Internal Medicine) Toy Cookey, FNP (Family Medicine) Jim Like, RN as Registered Nurse Scarlett Presto, RN (Inactive) as Registered Nurse Benita Gutter, RN as Oncology Nurse Navigator Almond Lint, MD as Consulting Physician (General Surgery) Earna Coder, MD as Consulting Physician (Internal Medicine)  CHIEF COMPLAINTS/PURPOSE OF CONSULTATION: Pancreatic cancer/severe diarrhea on chemotherapy  HISTORY OF PRESENTING ILLNESS: Patient accompanied by her son and daughter. Robin Daniels 68 y.o.  female pleasant patient with a metastatic pancreatic cancer currently on third line chemotherapy with [name] is currently admitted to the hospital for acute renal failure/failure to thrive.  Patient admission noted to have significant acute renal failure with a creatinine of 5 [baseline 1]; and also severe hypokalemia needing multiple rounds of potassium supplementation. Patient was evaluated by nephrology-and managed with IV hydration.   More recently patient noted to have worsening diarrhea.  Renal function is improving however potassium still getting to be supplemented.  Review of Systems  Constitutional:  Positive for malaise/fatigue and weight loss. Negative for chills, diaphoresis and fever.  HENT:  Negative for nosebleeds and sore throat.   Eyes:  Negative for double vision.  Respiratory:  Negative for cough, hemoptysis, sputum production, shortness of breath and wheezing.   Cardiovascular:  Negative for chest pain, palpitations, orthopnea and leg swelling.  Gastrointestinal:  Positive for diarrhea, nausea and vomiting. Negative for abdominal pain, blood in stool, constipation, heartburn and melena.  Genitourinary:  Negative for dysuria, frequency and urgency.  Musculoskeletal:  Negative for back pain and joint pain.  Skin: Negative.  Negative for itching and rash.   Neurological:  Positive for weakness. Negative for dizziness, tingling, focal weakness and headaches.  Endo/Heme/Allergies:  Does not bruise/bleed easily.  Psychiatric/Behavioral:  Negative for depression. The patient is not nervous/anxious and does not have insomnia.     MEDICAL HISTORY:  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/04/2019   Complication of anesthesia    Fluttering heart    Hyperlipidemia    Hypertension    Palliative care encounter 06/13/2023   PONV (postoperative nausea and vomiting)    Pre-diabetes    Vertigo    none recently    SURGICAL HISTORY: Past Surgical History:  Procedure Laterality Date   CATARACT EXTRACTION W/PHACO Right 06/09/2021   Procedure: CATARACT EXTRACTION PHACO AND INTRAOCULAR LENS PLACEMENT (IOC) RIGHT malyugin;  Surgeon: Galen Manila, MD;  Location: [facility];  Service: Ophthalmology;  Laterality: Right;  12.40 1:07.0   COLONOSCOPY WITH PROPOFOL N/A 01/13/2021   Procedure: COLONOSCOPY WITH PROPOFOL;  Surgeon: Wyline Mood, MD;  Location: [facility] ENDOSCOPY;  Service: Gastroenterology;  Laterality: N/A;   ENDOSCOPIC RETROGRADE CHOLANGIOPANCREATOGRAPHY (ERCP) WITH PROPOFOL N/A 11/16/2019   Procedure: ENDOSCOPIC RETROGRADE CHOLANGIOPANCREATOGRAPHY (ERCP) WITH PROPOFOL;  Surgeon: Midge Minium, MD;  Location: [facility] ENDOSCOPY;  Service: Endoscopy;  Laterality: N/A;   ERCP N/A 03/27/2020   Procedure: ENDOSCOPIC RETROGRADE CHOLANGIOPANCREATOGRAPHY (ERCP);  Surgeon: Midge Minium, MD;  Location: [facility] ENDOSCOPY;  Service: Endoscopy;  Laterality: N/A;   EUS N/A 11/29/2019   Procedure: FULL UPPER ENDOSCOPIC ULTRASOUND (EUS) RADIAL;  Surgeon: Doren Custard, MD;  Location: [facility] ENDOSCOPY;  Service: Gastroenterology;  Laterality: N/A;   IR FLUORO RM 30-60 MIN  07/30/2020   IR IMAGING GUIDED PORT INSERTION  12/14/2019  JEJUNOSTOMY Left 07/15/2020    Procedure: JEJUNOSTOMY;  Surgeon: Almond Lint, MD;  Location: [facility] OR;  Service: General;  Laterality: Left;   LAPAROSCOPY N/A 07/15/2020   Procedure: DIAGNOSTIC LAPAROSCOPY;  Surgeon: Almond Lint, MD;  Location: [facility] OR;  Service: General;  Laterality: N/A;   right knee replacement Right [id number]   SHOULDER ARTHROSCOPY W/ ROTATOR CUFF REPAIR Right    WHIPPLE PROCEDURE N/A 07/15/2020   Procedure: WHIPPLE PROCEDURE;  Surgeon: Almond Lint, MD;  Location[facility] OR;  Service: General;  Laterality: N/A;    SOCIAL HISTORY: Social History   Socioeconomic History   Marital status: Divorced    Spouse name: Not on file   Number of children: Not on file   Years of education: Not on file   Highest education level: Not on file  Occupational History   Not on file  Tobacco Use   Smoking status: Never   Smokeless tobacco: Never  Vaping Use   Vaping status: Never Used  Substance and Sexual Activity   Alcohol use: No    Alcohol/week: 0.0 standard drinks of alcohol   Drug use: No   Sexual activity: Never  Other Topics Concern   Not on file  Social History Narrative   ** Merged History Encounter **       Lives in [place]; with mom and son; worked in dietary; never smoked; no alcohol.    Social Drivers of Corporate investment banker Strain: Low Risk  (12/01/2022)   Overall Financial Resource Strain (CARDIA)    Difficulty of Paying Living Expenses: Not hard at all  Food Insecurity: No Food Insecurity (06/10/2023)   Hunger Vital Sign    Worried About Running Out of Food in the Last Year: Never true    Ran Out of Food in the Last Year: Never true  Transportation Needs: No Transportation Needs (06/10/2023)   PRAPARE - Administrator, Civil Service (Medical): No    Lack of Transportation (Non-Medical): No  Physical Activity: Not on file  Stress: Not on file  Social Connections: Moderately Isolated (06/10/2023)   Social Connection and Isolation Panel [NHANES]    Frequency of  Communication with Friends and Family: More than three times a week    Frequency of Social Gatherings with Friends and Family: More than three times a week    Attends Religious Services: More than 4 times per year    Active Member of [employer] or Organizations: No    Attends Banker Meetings: Never    Marital Status: Divorced  Catering manager Violence: Not At Risk (06/10/2023)   Humiliation, Afraid, Rape, and Kick questionnaire    Fear of Current or Ex-Partner: No    Emotionally Abused: No    Physically Abused: No    Sexually Abused: No    FAMILY HISTORY: Family History  Problem Relation Age of Onset   Diabetes Mother    Hyperlipidemia Mother    Hypertension Mother    Vision loss Mother    Arthritis Father    Hyperlipidemia Father    Hypertension Father    Breast cancer Maternal Aunt     ALLERGIES:  has no known allergies.  MEDICATIONS:  Current Facility-Administered Medications  Medication Dose Route Frequency Provider Last Rate Last Admin   Chlorhexidine Gluconate Cloth 2 % PADS 6 each  6 each Topical Daily Floydene Flock, MD   6 each at 06/13/23 1115   feeding supplement (NEPRO CARB STEADY) liquid 237 mL  237 mL  Oral TID BM Manuela Schwartz, NP   237 mL at 06/13/23 1028   heparin injection 5,000 Units  5,000 Units Subcutaneous Q8H Floydene Flock, MD   5,000 Units at 06/13/23 2115   octreotide (SANDOSTATIN) 500 mcg in sodium chloride 0.9 % 250 mL (2 mcg/mL) infusion  50 mcg/hr Intravenous Continuous Earna Coder, MD 25 mL/hr at 06/13/23 2117 50 mcg/hr at 06/13/23 2117   ondansetron (ZOFRAN) tablet 4 mg  4 mg Oral Q6H PRN Floydene Flock, MD       Or   ondansetron [facility]) injection 4 mg  4 mg Intravenous Q6H PRN Floydene Flock, MD   4 mg at 06/12/23 2008   potassium chloride (KLOR-CON) packet 40 mEq  40 mEq Oral Q4H Darlin Priestly, MD   40 mEq at 06/13/23 1839   sodium bicarbonate 150 mEq in sterile water 1,150 mL infusion   Intravenous Continuous Darlin Priestly, MD 75 mL/hr at 06/13/23 1332 New Bag at 06/13/23 1332   sodium bicarbonate tablet 650 mg  650 mg Oral TID Darlin Priestly, MD   650 mg at 06/13/23 2115   sodium chloride flush (NS) 0.9 % injection 10-40 mL  10-40 mL Intracatheter Q12H Floydene Flock, MD   10 mL at 06/13/23 2114   sodium chloride flush (NS) 0.9 % injection 10-40 mL  10-40 mL Intracatheter PRN Floydene Flock, MD       Facility-Administered Medications Ordered in Other Encounters  Medication Dose Route Frequency Provider Last Rate Last Admin   sodium chloride flush (NS) 0.9 % injection 10 mL  10 mL Intravenous PRN Louretta Shorten R, MD   10 mL at 06/23/21 1012   sodium chloride flush (NS) 0.9 % injection 10 mL  10 mL Intracatheter PRN Earna Coder, MD   10 mL at 03/16/23 1311    PHYSICAL EXAMINATION:   Vitals:   06/13/23 1506 06/13/23 2012  BP: (!) 99/56 111/69  Pulse: 76 95  Resp: 18 20  Temp: 97.8 F (36.6 C) 97.8 F (36.6 C)  SpO2: 100% 100%   Filed Weights   06/10/23 0846 06/11/23 2108  Weight: 147 lb 11.3 oz (67 kg) 145 lb 15.1 oz (66.2 kg)  Cachectic appearing African-American female patient.  Physical Exam Vitals and nursing note reviewed.  HENT:     Head: Normocephalic and atraumatic.     Mouth/Throat:     Pharynx: Oropharynx is clear.  Eyes:     Extraocular Movements: Extraocular movements intact.     Pupils: Pupils are equal, round, and reactive to light.  Cardiovascular:     Rate and Rhythm: Normal rate and regular rhythm.  Pulmonary:     Comments: Decreased breath sounds bilaterally.  Abdominal:     Palpations: Abdomen is soft.  Musculoskeletal:        General: Normal range of motion.     Cervical back: Normal range of motion.  Skin:    General: Skin is warm.  Neurological:     General: No focal deficit present.     Mental Status: She is alert and oriented to person, place, and time.  Psychiatric:        Behavior: Behavior normal.        Judgment: Judgment normal.      LABORATORY DATA:  I have reviewed the data as listed Lab Results  Component Value Date   WBC 8.9 06/13/2023   HGB 6.6 (L) 06/13/2023   HCT 18.8 (L) 06/13/2023   MCV  74.0 (L) 06/13/2023   PLT 327 06/13/2023   Recent Labs    05/25/23 0830 06/10/23 0849 06/10/23 1509 06/11/23 0450 06/11/23 1654 06/11/23 2004 06/12/23 0258 06/12/23 1704 06/13/23 0530 06/13/23 1713  NA 132* 126*   < > 127* 126*  --  129*  --  133*  --   K 3.4* <2.0*   < > 2.2* <2.0* 2.4* 2.7* 3.1* 2.8* 3.1*  CL 102 97*   < > 99 92*  --  102  --  113*  --   CO2 20* 10*   < > 17* 20*  --  17*  --  14*  --   GLUCOSE 123* 183*   < > 146* 175*  --  119*  --  94  --   BUN 6* 78*   < > 67* 62*  --  58*  --  45*  --   CREATININE 1.00 5.01*   < > 3.71* 3.09*  --  2.71*  --  1.74*  --   CALCIUM 8.7* 8.5*   < > 7.6* 7.8* 7.8* 7.7*  --  7.7*  --   GFRNONAA >60 9*   < > 13* 16*  --  18*  --  31*  --   PROT 7.1 7.6  --  6.0*  --   --   --   --   --   --   ALBUMIN 3.3* 3.3*  --  2.7*  --  2.7*  --   --   --   --   AST 22 20  --  21  --   --   --   --   --   --   ALT 17 32  --  22  --   --   --   --   --   --   ALKPHOS 107 127*  --  93  --   --   --   --   --   --   BILITOT 0.6 0.6  --  0.6  --   --   --   --   --   --    < > = values in this interval not displayed.    RADIOGRAPHIC STUDIES: I have personally reviewed the radiological images as listed and agreed with the findings in the report. CT ABDOMEN PELVIS WO CONTRAST Result Date: 06/10/2023 CLINICAL DATA:  68 year old female with a history of abdominal pain EXAM: CT ABDOMEN AND PELVIS WITHOUT CONTRAST TECHNIQUE: Multidetector CT imaging of the abdomen and pelvis was performed following the standard protocol without IV contrast. RADIATION DOSE REDUCTION: This exam was performed according to the departmental dose-optimization program which includes automated exposure control, adjustment of the mA and/or kV according to patient size and/or use of iterative  reconstruction technique. COMPARISON:  CT 01/25/2023, PET-CT 05/24/2023 FINDINGS: Lower chest: Mild centrilobular nodularity within the medial right lower lobe Hepatobiliary: Pneumobilia. Focal 14 mm hypodensity the lower liver, poorly characterized on the noncontrast CT though appears new from the prior PET. Redemonstration cholecystectomy, pancreatico duodenectomy. Unchanged position of the stent at the biliary anastomosis. The surgical site is poorly characterized with the absence of contrast. Pancreas: Surgical changes of pancreatico duodenectomy, stent at the anastomosis. Surgical site is poorly characterized with the absence of contrast. Spleen: Unremarkable Adrenals/Urinary Tract: - Right adrenal gland:  Unremarkable - Left adrenal gland: Unremarkable. - Right kidney: No hydronephrosis, nephrolithiasis, inflammation, or ureteral dilation. - Left Kidney: No hydronephrosis, nephrolithiasis, inflammation, or ureteral  dilation. - Urinary Bladder: Unremarkable. Stomach/Bowel: Redemonstration surgical changes at the stomach, surgical changes of pancreatico duodenectomy. Relatively unremarkable small bowel. The length of the colon is fluid-filled, without wall thickening or significant focal inflammatory changes. No evidence of obstruction as the fluid extends through the rectum. Vascular/Lymphatic: Atherosclerotic changes of the abdominal aorta, mesenteric arteries, renal arteries, iliac arteries. Small mesenteric lymph nodes nonspecific. Reproductive: Unremarkable uterus/adnexa Other: Periumbilical fat. Musculoskeletal: No acute displaced fracture. Degenerative changes of the thoracolumbar spine. IMPRESSION: The length of colon is fluid-filled, nonspecific finding though potentially representing enteritis and/or colitis. No evidence of obstruction, and no focal inflammation. Redemonstration of surgical changes of pancreatico duodenectomy. The surgical site is poorly characterized with the absence of contrast.  Pattern of centrilobular nodularity in the medial right lower lobe compatible with infectious/inflammatory change. **An incidental finding of potential clinical significance has been found. Focal hypodensity of the right liver measuring 14 mm. A focal liver lesion cannot be excluded and in the setting of the patient's known prior pancreatic cancer, follow-up with oncology is recommended to determine further imaging strategy such as contrast MRI or CT.** Aortic Atherosclerosis (ICD10-I70.0). Additional ancillary findings as above Electronically Signed   By: Gilmer Mor D.O.   On: 06/10/2023 15:57   DG Chest 2 View Result Date: 06/10/2023 CLINICAL DATA:  Lethargy, failure to thrive. EXAM: CHEST - 2 VIEW COMPARISON:  July 15, 2020.  May 24, 2023. FINDINGS: The heart size and mediastinal contours are within normal limits. 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 No definite consolidative process is noted. Right internal jugular Port-A-Cath is unchanged. The visualized skeletal structures are unremarkable. IMPRESSION: No active cardiopulmonary disease. Electronically Signed   By: Lupita Raider M.D.   On: 06/10/2023 11:03   NM PET Image Restage (PS) Skull Base to Thigh (F-18 FDG) Result Date: 06/08/2023 CLINICAL DATA:  Subsequent treatment strategy for pancreatic cancer. EXAM: NUCLEAR MEDICINE PET SKULL BASE TO THIGH TECHNIQUE: 7.83 mCi F-18 FDG was injected intravenously. Full-ring PET imaging was performed from the skull base to thigh after the radiotracer. CT data was obtained and used for attenuation correction and anatomic localization. Fasting blood glucose: 100 mg/dl COMPARISON:  Prior PET CTs 02/25/2023 and 11/09/2022. FINDINGS: Mediastinal blood pool activity: SUV max 2.99 Liver activity: SUV max NA NECK: No hypermetabolic lymph nodes in the neck. There is a small focus of hypermetabolism in the left thyroid lobe. This corresponds to an 8 mm  nodule on the prior CT scan. Recommend thyroid US (ref: J Am Coll Radiol. 2015 Feb;12(2): 143-50). Incidental CT findings: Bilateral carotid artery calcifications. CHEST: No hypermetabolic mediastinal or hilar nodes. No suspicious pulmonary nodules on the CT scan. No hypermetabolic breast masses, supraclavicular or axillary adenopathy. Incidental CT findings: Stable atherosclerotic calcifications involving the aorta and coronary arteries. 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 ABDOMEN/PELVIS: Persistent focus of hypermetabolism noted near the anastomosis and surrounding the vascular clips or sutures posterior to the pancreatic duct drain. SUV max is 8.98. This area was smaller and had an SUV max of 8.36 on the prior PET-CT. Second hypermetabolic focus just to the left of the celiac axis has an SUV max of 4.24. This area was much larger on the prior study and had an SUV max of 7.30. On the prior study there was a third focus of hypermetabolism chest to the left of the SMA which corresponded to a small lymph node. Do not see any measurable lymph node  or any residual hypermetabolism on today study. Persistent hypermetabolism associated with the antral region of the stomach possibly related to reflux from the Whipple procedure. No new hypermetabolic foci to suggest hepatic metastatic disease. No omental or peritoneal implants or new areas of abdominal or pelvic adenopathy. Incidental CT findings: Stable surgical changes. The pancreatic duct stent appears stable. Minimal scattered atherosclerotic calcifications. Stable periumbilical abdominal wall hernia containing fat. No free fluid. SKELETON: No findings suspicious for osseous metastatic disease. Incidental CT findings: None. IMPRESSION: 1. Persistent focus of hypermetabolism (8.98 versus previous 8.36) near the anastomosis and surrounding the vascular clips or sutures posterior to the pancreatic duct drain. This area appears larger. 2. Second hypermetabolic  focus just to the left of the celiac axis has an SUV max of 4.24. This area was much larger on the prior study and had an SUV max of 7.30. 3. On the prior study there was a third focus of hypermetabolism just to the left of the SMA which corresponded to a small lymph node. I do not see any measurable lymph node or any residual hypermetabolism on today study. 4. No findings to suggest hepatic metastatic disease. 5. No findings for metastatic disease involving the chest or pelvis. 6. Small focus of hypermetabolism in the left thyroid lobe. This corresponds to an 8 mm nodule on the prior CT scan. Recommend thyroid US. 7. Aortic atherosclerosis. Electronically Signed   By: Rudie Meyer M.D.   On: 06/08/2023 46:67    # 68 year old female patient with history of pancreatic cancer stage IV currently on chemotherapy with NALIRI-is currently admitted to hospital for severe acute renal failure and also severe hypokalemia  # Metastatic pancreatic cancer-currently on chemotherapy with NALIRI s/p 5 cycles.  Mostly PET scan/CT scan concerning for progressive disease along with rising tumor markers.   # Severe acute renal failure/severe electrolyte abnormalities including hypokalemia-unclear etiology likely prerenal/ATN-followed by nephrology.  5-FU or irinotecan by themselves are not nephrotoxic.   # Diarrhea-likely secondary to irinotecan chemotherapy-question leading to prerenal/ATN.  Rule out infectious causes.  # Anemia-hemoglobin around 6.6 multifactorial-recent chemotherapy/underlying possible thalassemia/and acute renal failure.  No episodes of GI bleed noted.  Agree with PRBC transfusion. # Recommendations/plan:  # Discussed with patient and family given the concern for progressive disease on current chemotherapy-however she will need outpatient MRI of the liver to confirm/refute lesion seen on the noncontrast CT scan.  If progressive disease noted-next option would be clinical trials.   # Given ongoing  diarrhea recommend-octreotide infusion.  On outpatient basis will check U GTA genotype.  # Also discussed CODE STATUS.  Also status post evaluation with-palliative care/Josh Borders.  For now we will continue current scope of care.  # Thank you Dr. Fran Lowes for allowing me to participate in the care of your pleasant patient. Please do not hesitate to contact me with questions or concerns in the interim.  Also discussed with Dr. Fran Lowes and Dr. Lourdes Sledge and [facility].  Above plan of care was discussed with patient/family-daughter and son by the bedside in detail.  My contact information was given to the patient/family.     Earna Coder, MD 06/13/2023 10:15 PM

## 2023-06-13 NOTE — Progress Notes (Signed)
 PT Cancellation Note  Patient Details Name: Robin Daniels MRN: [id number] DOB: Nov 23, 1955   Cancelled Treatment:    Reason Eval/Treat Not Completed: Medical issues which prohibited therapy Patient with Hgb 6.6 this AM. Will hold PT at this time until medically appropriate.   [name], PT, DPT Physical Therapist - [facility]  [facility]    Auna Mikkelsen A Ritha Sampedro 06/13/2023, 8:32 AM

## 2023-06-13 NOTE — Care Management Important Message (Signed)
 Important Message  Patient Details  Name: Robin Daniels MRN: [id number] Date of Birth: 08-09-1955   Important Message Given:  Yes - Medicare IM     Cristela Blue, CMA 06/13/2023, 12:03 PM

## 2023-06-13 NOTE — Progress Notes (Signed)
 [facility]  ROUNDING NOTE   Subjective:  Robin Daniels is a 68 y.o. 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 Creatinine down to 1.74. Potassium still low at 2.8. Patient having ongoing diarrhea and may have stool loss of potassium and bicarbonate as well.   Objective:  Vital signs in last 24 hours:  Temp:  [97.7 F (36.5 C)-98.6 F (37 C)] 97.8 F (36.6 C) (03/17 1506) Pulse Rate:  [76-95] 76 (03/17 1506) Resp:  [18-20] 18 (03/17 1506) BP: (93-115)/(46-70) 99/56 (03/17 1506) SpO2:  [100 %] 100 % (03/17 1506)  Weight change:  Filed Weights   06/10/23 0846 06/11/23 2108  Weight: 67 kg 66.2 kg    Intake/Output: I/O last 3 completed shifts: In: 2930 [P.O.:710; I.V.:2094.8; IV Piggyback:125.3] Out: 602 [Urine:451; Emesis/NG output:150; Stool:1]   Intake/Output this shift:  Total I/O In: 588 [P.O.:240; Blood:348] Out: -   Physical Exam: General: NAD  Head: Normocephalic, atraumatic. Moist oral mucosal membranes  Eyes: Anicteric  Neck: Supple, trachea midline  Lungs:  Clear to auscultation  Heart: Regular rate and rhythm  Abdomen:  Soft, nontender,   Extremities: No peripheral edema.  Neurologic: Nonfocal, moving all four extremities  Skin: No lesions  Access: None    Basic Metabolic Panel: Recent Labs  Lab 06/10/23 0859 06/10/23 1509 06/10/23 1655 06/11/23 0135 06/11/23 0450 06/11/23 1654 06/11/23 2004 06/12/23 0258 06/12/23 1704 06/13/23 0530  NA  --  127*   < > 125* 127* 126*  --  129*  --  133*  K  --  2.3*   < >  --  2.2* <2.0* 2.4* 2.7* 3.1* 2.8*  CL  --  101  --   --  99 92*  --  102  --  113*  CO2  --  11*  --   --  17* 20*  --  17*  --  14*  GLUCOSE  --  138*  --   --  146* 175*  --  119*  --  94  BUN  --  78*  --   --  67* 62*  --  58*  --  45*  CREATININE  --   4.37*  --   --  3.71* 3.09*  --  2.71*  --  1.74*  CALCIUM  --  7.9*  --   --  7.6* 7.8* 7.8* 7.7*  --  7.7*  MG 1.5*  --   --   --  1.3* 2.8*  --  2.7*  --  2.2  PHOS 7.4*  --   --   --   --   --   --   --   --  2.1*   < > = values in this interval not displayed.    Liver Function Tests: Recent Labs  Lab 06/10/23 0849 06/11/23 0450 06/11/23 2004  AST 20 21  --   ALT 32 22  --   ALKPHOS 127* 93  --   BILITOT 0.6 0.6  --   PROT 7.6 6.0*  --   ALBUMIN 3.3* 2.7* 2.7*   Recent Labs  Lab 06/10/23 0849  LIPASE 42   No results for input(s): "AMMONIA" in the last 168 hours.  CBC: Recent Labs  Lab 06/10/23 0849 06/11/23 0450 06/12/23 0258 06/13/23 0530  WBC  9.5 10.4 8.1 8.9  NEUTROABS 6.4  --   --   --   HGB 10.1* 8.0* 7.2* 6.6*  HCT 29.7* 22.2* 20.0* 18.8*  MCV 71.9* 70.9* 70.9* 74.0*  PLT 607* 380 347 327    Cardiac Enzymes: No results for input(s): "CKTOTAL", "CKMB", "CKMBINDEX", "TROPONINI" in the last 168 hours.  BNP: Invalid input(s): "POCBNP"  CBG: No results for input(s): "GLUCAP" in the last 168 hours.  Microbiology: Results for orders placed or performed during the hospital encounter of 07/15/20  Fungus Culture With Stain     Status: None   Collection Time: 07/15/20  9:08 AM   Specimen: Abscess; Wound  Result Value Ref Range Status   Fungus Stain Final report  Final   Fungus (Mycology) Culture Final report  Final    Comment: (NOTE) Performed At: [facility] [address] [place], [place] [id number] Jolene Schimke MD VQ:[id number]    Fungal Source WOUND  Final    Comment: Performed at [facility], [address], [place], [place] [address]  Aerobic/Anaerobic Culture w Gram Stain (surgical/deep wound)     Status: None   Collection Time: 07/15/20  9:08 AM   Specimen: Abscess; Wound  Result Value Ref Range Status   Specimen Description WOUND  Final   Special Requests GALLBLADDER CULTURE SPEC A  Final   Gram Stain   Final     ABUNDANT WBC PRESENT,BOTH PMN AND MONONUCLEAR ABUNDANT GRAM POSITIVE COCCI IN PAIRS IN CLUSTERS RARE GRAM NEGATIVE COCCOBACILLI RARE GRAM POSITIVE RODS    Culture   Final    ABUNDANT STREPTOCOCCUS CONSTELLATUS NO ANAEROBES ISOLATED Performed at [facility], [address], [place], [place] [address]    Report Status 07/21/2020 FINAL  Final   Organism ID, Bacteria STREPTOCOCCUS CONSTELLATUS  Final      Susceptibility   Streptococcus constellatus - MIC*    PENICILLIN INTERMEDIATE Intermediate     CEFTRIAXONE 1 SENSITIVE Sensitive     ERYTHROMYCIN <=0.12 SENSITIVE Sensitive     LEVOFLOXACIN <=0.25 SENSITIVE Sensitive     VANCOMYCIN 0.5 SENSITIVE Sensitive     * ABUNDANT STREPTOCOCCUS CONSTELLATUS  Fungus Culture Result     Status: None   Collection Time: 07/15/20  9:08 AM  Result Value Ref Range Status   Result 1 Comment  Final    Comment: (NOTE) KOH/Calcofluor preparation:  no fungus observed. Performed At: [facility] [address] [place], [place] [id number] Jolene Schimke MD WF:[id number]   Fungal organism reflex     Status: None   Collection Time: 07/15/20  9:08 AM  Result Value Ref Range Status   Fungal result 1 Comment  Final    Comment: (NOTE) No yeast or mold isolated after 4 weeks. Performed At: [facility] [address] [place], [place] [id number] Jolene Schimke MD CB:[id number]   Culture, blood (routine x 2)     Status: None   Collection Time: 07/17/20  2:53 AM   Specimen: BLOOD  Result Value Ref Range Status   Specimen Description BLOOD RIGHT ANTECUBITAL  Final   Special Requests   Final    BOTTLES DRAWN AEROBIC AND ANAEROBIC Blood Culture adequate volume   Culture   Final    NO GROWTH 5 DAYS Performed at [facility], [address], [place], [place] [address]    Report Status 07/22/2020 FINAL  Final  Culture, blood (routine x 2)     Status: None   Collection Time: 07/17/20  3:01  AM   Specimen: BLOOD RIGHT FOREARM   Result Value Ref Range Status   Specimen Description BLOOD RIGHT FOREARM  Final   Special Requests AEROBIC BOTTLE ONLY Blood Culture adequate volume  Final   Culture   Final    NO GROWTH 5 DAYS Performed at [facility], [address], [place], [place] [address]    Report Status 07/22/2020 FINAL  Final    Coagulation Studies: No results for input(s): "LABPROT", "INR" in the last 72 hours.  Urinalysis: Recent Labs    06/11/23 0223  COLORURINE YELLOW*  LABSPEC 1.012  PHURINE 5.0  GLUCOSEU NEGATIVE  HGBUR NEGATIVE  BILIRUBINUR NEGATIVE  KETONESUR NEGATIVE  PROTEINUR 30*  NITRITE NEGATIVE  LEUKOCYTESUR NEGATIVE      Imaging: No results found.   Medications:    octreotide (SANDOSTATIN) 500 mcg in sodium chloride 0.9 % 250 mL (2 mcg/mL) infusion 50 mcg/hr (06/13/23 1704)   sodium bicarbonate 150 mEq in sterile water 1,150 mL infusion 75 mL/hr at 06/13/23 1332    Chlorhexidine Gluconate Cloth  6 each Topical Daily   feeding supplement (NEPRO CARB STEADY)  237 mL Oral TID BM   heparin  5,000 Units Subcutaneous Q8H   sodium bicarbonate  650 mg Oral TID   sodium chloride flush  10-40 mL Intracatheter Q12H   ondansetron **OR** ondansetron (ZOFRAN) IV, sodium chloride flush  Assessment/ Plan:  Robin Daniels is a 68 y.o. 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  Acute kidney injury Acute kidney injury likely secondary to Severe ATN. Renal function improving daily.  Creatinine down to 1.74.  Continue hydration with sodium bicarbonate drip. Lab Results  Component Value Date   CREATININE 1.74 (H) 06/13/2023   CREATININE 2.71 (H) 06/12/2023   CREATININE 3.09 (H) 06/11/2023       Intake/Output Summary (Last 24 hours) at 06/13/2023 1717 Last data filed at 06/13/2023 1500 Gross per 24 hour  Intake 948 ml  Output 452 ml  Net 496 ml       2.  Severe hypokalemia K 2.7 06/12/2023 Hypomagnesemia was also playing a role in hypokalemia initially.  Serum potassium 2.8 this a.m.  Patient receiving aggressive repletion.  May need to consider adding spironolactone once renal function improves a bit further to help retain potassium.   3.  Hyponatremia Sodium improved to 133.  Continue to monitor.   4. Hypomagnesemia  Magnesium now up to 2.2.  Continue to monitor.   5.  Acute metabolic acidosis Serum bicarbonate down to 14.  Patient restarted on bicarbonate drip.   6.  Anemia  Hemoglobin down to 6.6.  Consider blood transfusion but defer to hematology oncology.   LOS: 3 Rexanne Inocencio 3/17/20255:17 PM

## 2023-06-14 DIAGNOSIS — N179 Acute kidney failure, unspecified: Secondary | ICD-10-CM | POA: Diagnosis not present

## 2023-06-14 LAB — TYPE AND SCREEN
ABO/RH(D): B POS
Antibody Screen: NEGATIVE
Unit division: 0

## 2023-06-14 LAB — GASTROINTESTINAL PANEL BY PCR, STOOL (REPLACES STOOL CULTURE)

## 2023-06-14 LAB — BPAM RBC
Blood Product Expiration Date: 202504142359
ISSUE DATE / TIME: 202503170958
Unit Type and Rh: 7300

## 2023-06-14 LAB — BASIC METABOLIC PANEL
Anion gap: 8 (ref 5–15)
BUN: 33 mg/dL — ABNORMAL HIGH (ref 8–23)
CO2: 14 mmol/L — ABNORMAL LOW (ref 22–32)
Calcium: 8.1 mg/dL — ABNORMAL LOW (ref 8.9–10.3)
Chloride: 113 mmol/L — ABNORMAL HIGH (ref 98–111)
Creatinine, Ser: 1.51 mg/dL — ABNORMAL HIGH (ref 0.44–1.00)
GFR, Estimated: 37 mL/min — ABNORMAL LOW (ref >60)
Glucose, Bld: 131 mg/dL — ABNORMAL HIGH (ref 70–99)
Potassium: 3.2 mmol/L — ABNORMAL LOW (ref 3.5–5.1)
Sodium: 135 mmol/L (ref 135–145)

## 2023-06-14 LAB — CBC
HCT: 23.4 % — ABNORMAL LOW (ref 36.0–46.0)
Hemoglobin: 8.1 g/dL — ABNORMAL LOW (ref 12.0–15.0)
MCH: 26.4 pg (ref 26.0–34.0)
MCHC: 34.6 g/dL (ref 30.0–36.0)
MCV: 76.2 fL — ABNORMAL LOW (ref 80.0–100.0)
Platelets: 333 10*3/uL (ref 150–400)
RBC: 3.07 MIL/uL — ABNORMAL LOW (ref 3.87–5.11)
RDW: 19.6 % — ABNORMAL HIGH (ref 11.5–15.5)
WBC: 9.1 10*3/uL (ref 4.0–10.5)
nRBC: 0.3 % — ABNORMAL HIGH (ref 0.0–0.2)

## 2023-06-14 LAB — MAGNESIUM: Magnesium: 1.9 mg/dL (ref 1.7–2.4)

## 2023-06-14 MED ORDER — STERILE WATER FOR INJECTION IV SOLN
INTRAVENOUS | Status: DC
  Filled 2023-06-14 (×3): qty 150

## 2023-06-14 MED ORDER — DIPHENOXYLATE-ATROPINE 2.5-0.025 MG PO TABS
1.0000 | ORAL_TABLET | Freq: Four times a day (QID) | ORAL | Status: DC | PRN
Start: 2023-06-14 — End: 2023-06-17

## 2023-06-14 MED ORDER — AZITHROMYCIN 250 MG PO TABS
500.0000 mg | ORAL_TABLET | Freq: Every day | ORAL | Status: DC
  Administered 2023-06-14 – 2023-06-16 (×3): 500 mg via ORAL
  Filled 2023-06-14 (×3): qty 2

## 2023-06-14 MED ORDER — POTASSIUM CHLORIDE 20 MEQ PO PACK
40.0000 meq | PACK | ORAL | Status: AC
  Administered 2023-06-14 (×2): 40 meq via ORAL
  Filled 2023-06-14 (×2): qty 2

## 2023-06-14 NOTE — Progress Notes (Signed)
 Occupational Therapy Treatment Patient Details Name: Robin Daniels MRN: [id number] DOB: [birth year] Today's Date: 06/14/2023   History of present illness Pt is a 68 y/o F admitted on 06/10/23 after presenting with c/o decreased p.o. intake, mild nausea. Pt is being treated for AKI, metabolic acidosis, hyponatremia & hypokalemia. PMH: Stage IV pancreatic CA, HTN, HLD, anemia, anxiety, vertigo   OT comments  Chart reviewed to date, pt greeted sitting on edge of bed, reports she feels stronger. Pt is in agreement to tx session targeting improving functional activity tolerance in preparation for ADLs. Pt performs STS with supervision, amb in room/bathroom total of approx 200' with RW with supervision. Discussed and educated pt re: use of DME for safe ADLs (bedside commode,shower chair, PRN use of dressing aids). Pt is making progress towards goals, discharge recommendation remains appropriate. OT will continue to follow.      If plan is discharge home, recommend the following:  A little help with walking and/or transfers;A little help with bathing/dressing/bathroom;Assistance with cooking/housework;Assist for transportation;Help with stairs or ramp for entrance   Equipment Recommendations  BSC/3in1    Recommendations for Other Services      Precautions / Restrictions Precautions Precautions: Fall Recall of Precautions/Restrictions: Intact Restrictions Weight Bearing Restrictions Per Provider Order: No       Mobility Bed Mobility               General bed mobility comments: NT pt sitting on edge of bed pre/post session    Transfers Overall transfer level: Needs assistance Equipment used: Rolling walker (2 wheels) Transfers: Sit to/from Stand Sit to Stand: Supervision                 Balance Overall balance assessment: Needs assistance Sitting-balance support: Feet supported Sitting balance-Leahy Scale: Normal     Standing balance support: Bilateral upper extremity  supported, During functional activity Standing balance-Leahy Scale: Good                             ADL either performed or assessed with clinical judgement   ADL Overall ADL's : Needs assistance/impaired Eating/Feeding: Set up;Sitting   Grooming: Standing;Wash/dry hands;Supervision/safety               Lower Body Dressing: Minimal assistance Lower Body Dressing Details (indicate cue type and reason): discussed use of AE PRN Toilet Transfer: Supervision/safety;Rolling walker (2 wheels);Ambulation Toilet Transfer Details (indicate cue type and reason): simulated         Functional mobility during ADLs: Supervision/safety;Rolling walker (2 wheels) (approx 200' with RW in hallway)      Extremity/Trunk Assessment              Vision       Perception     Praxis     Communication Communication Communication: No apparent difficulties   Cognition Arousal: Alert Behavior During Therapy: WFL for tasks assessed/performed Cognition: No apparent impairments                               Following commands: Intact        Cueing   Cueing Techniques: Verbal cues  Exercises Other Exercises Other Exercises: edu re: role of OT, role of rehab, importance of continued mobility while admitted    Shoulder Instructions       General Comments vss throughout    Pertinent Vitals/ Pain  Pain Assessment Pain Assessment: No/denies pain  Home Living                                          Prior Functioning/Environment              Frequency  Min 2X/week        Progress Toward Goals  OT Goals(current goals can now be found in the care plan section)  Progress towards OT goals: Progressing toward goals  Acute Rehab OT Goals Time For Goal Achievement: 06/25/23  Plan      Co-evaluation                 AM-PAC OT "6 Clicks" Daily Activity     Outcome Measure   Help from another person eating  meals?: None Help from another person taking care of personal grooming?: A Little Help from another person toileting, which includes using toliet, bedpan, or urinal?: A Little Help from another person bathing (including washing, rinsing, drying)?: A Lot Help from another person to put on and taking off regular upper body clothing?: A Little Help from another person to put on and taking off regular lower body clothing?: A Little 6 Click Score: 18    End of Session Equipment Utilized During Treatment: Rolling walker (2 wheels)  OT Visit Diagnosis: Unsteadiness on feet (R26.81);Muscle weakness (generalized) (M62.81)   Activity Tolerance Patient tolerated treatment well   Patient Left in bed;with call bell/phone within reach   Nurse Communication          Time: 4098-1191 OT Time Calculation (min): 8 min  Charges: OT General Charges $OT Visit: 1 Visit OT Treatments $Therapeutic Activity: 8-22 mins  Oleta Mouse, OTD OTR/L  06/14/23, 4:18 PM

## 2023-06-14 NOTE — Progress Notes (Signed)
 [facility]  ROUNDING NOTE   Subjective:  Robin Daniels is a 68 y.o. 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, depression    was admitted on 06/10/2023 for :   AKI (acute kidney injury) (HCC) [N17.9]  Update:  Seen resting in bed Family at bedside Denies pain States she feels well today No lower extremity edema  Creatinine 1.51 Potassium 2.8 to 3.1, now 3.2.  S bicarb 14   Objective:  Vital signs in last 24 hours:  Temp:  [97.6 F (36.4 C)-98 F (36.7 C)] 97.9 F (36.6 C) (03/18 1126) Pulse Rate:  [78-95] 84 (03/18 1126) Resp:  [16-20] 16 (03/18 1126) BP: (106-114)/(55-69) 106/64 (03/18 1126) SpO2:  [100 %] 100 % (03/18 1126) Weight:  [68.4 kg] 68.4 kg (03/18 0540)  Weight change:  Filed Weights   06/10/23 0846 06/11/23 2108 06/14/23 0540  Weight: 67 kg 66.2 kg 68.4 kg    Intake/Output: I/O last 3 completed shifts: In: 958 [P.O.:600; I.V.:10; Blood:348] Out: 452 [Urine:451; Stool:1]   Intake/Output this shift:  No intake/output data recorded.  Physical Exam: General: NAD  Head: Normocephalic, atraumatic. Moist oral mucosal membranes  Eyes: Anicteric  Lungs:  Clear to auscultation  Heart: Regular rate and rhythm  Abdomen:  Soft, nontender,   Extremities: No peripheral edema.  Neurologic: Alert, moving all four extremities  Skin: No lesions  Access: None    Basic Metabolic Panel: Recent Labs  Lab 06/10/23 0859 06/10/23 1509 06/11/23 0450 06/11/23 1654 06/11/23 2004 06/12/23 0258 06/12/23 1704 06/13/23 0530 06/13/23 1713 06/14/23 0420  NA  --    < > 127* 126*  --  129*  --  133*  --  135  K  --    < > 2.2* <2.0*   < > 2.7* 3.1* 2.8* 3.1* 3.2*  CL  --    < > 99 92*  --  102  --  113*  --  113*  CO2  --    < > 17* 20*  --  17*  --  14*  --  14*  GLUCOSE  --    < > 146* 175*  --  119*  --  94  --  131*  BUN  --    < > 67* 62*  --  58*  --  45*  --  33*  CREATININE  --    < > 3.71* 3.09*  --   2.71*  --  1.74*  --  1.51*  CALCIUM  --    < > 7.6* 7.8*   < > 7.7*  --  7.7*  --  8.1*  MG 1.5*  --  1.3* 2.8*  --  2.7*  --  2.2  --  1.9  PHOS 7.4*  --   --   --   --   --   --  2.1* 5.5*  --    < > = values in this interval not displayed.    Liver Function Tests: Recent Labs  Lab 06/10/23 0849 06/11/23 0450 06/11/23 2004  AST 20 21  --   ALT 32 22  --   ALKPHOS 127* 93  --   BILITOT 0.6 0.6  --   PROT 7.6 6.0*  --   ALBUMIN 3.3* 2.7* 2.7*   Recent Labs  Lab 06/10/23 0849  LIPASE 42   No results for input(s): "AMMONIA" in the last 168 hours.  CBC: Recent Labs  Lab  06/10/23 0849 06/11/23 0450 06/12/23 0258 06/13/23 0530 06/14/23 0420  WBC 9.5 10.4 8.1 8.9 9.1  NEUTROABS 6.4  --   --   --   --   HGB 10.1* 8.0* 7.2* 6.6* 8.1*  HCT 29.7* 22.2* 20.0* 18.8* 23.4*  MCV 71.9* 70.9* 70.9* 74.0* 76.2*  PLT 607* 380 347 327 333    Cardiac Enzymes: No results for input(s): "CKTOTAL", "CKMB", "CKMBINDEX", "TROPONINI" in the last 168 hours.  BNP: Invalid input(s): "POCBNP"  CBG: No results for input(s): "GLUCAP" in the last 168 hours.  Microbiology: Results for orders placed or performed during the hospital encounter of 06/10/23  Gastrointestinal Panel by PCR , Stool     Status: Abnormal   Collection Time: 06/13/23  9:25 PM   Specimen: Stool  Result Value Ref Range Status   Campylobacter species DETECTED (A) NOT DETECTED Final    Comment: RESULT CALLED TO, READ BACK BY AND VERIFIED WITH:  Robinette Haines AT 0235 06/14/23 JG    Plesimonas shigelloides NOT DETECTED NOT DETECTED Final   Salmonella species NOT DETECTED NOT DETECTED Final   Yersinia enterocolitica NOT DETECTED NOT DETECTED Final   Vibrio species NOT DETECTED NOT DETECTED Final   Vibrio cholerae NOT DETECTED NOT DETECTED Final   Enteroaggregative E coli (EAEC) NOT DETECTED NOT DETECTED Final   Enteropathogenic E coli (EPEC) NOT DETECTED NOT DETECTED Final   Enterotoxigenic E coli (ETEC) NOT DETECTED  NOT DETECTED Final   Shiga like toxin producing E coli (STEC) NOT DETECTED NOT DETECTED Final   Shigella/Enteroinvasive E coli (EIEC) NOT DETECTED NOT DETECTED Final   Cryptosporidium NOT DETECTED NOT DETECTED Final   Cyclospora cayetanensis NOT DETECTED NOT DETECTED Final   Entamoeba histolytica NOT DETECTED NOT DETECTED Final   Giardia lamblia NOT DETECTED NOT DETECTED Final   Adenovirus F40/41 NOT DETECTED NOT DETECTED Final   Astrovirus NOT DETECTED NOT DETECTED Final   Norovirus GI/GII NOT DETECTED NOT DETECTED Final   Rotavirus A NOT DETECTED NOT DETECTED Final   Sapovirus (I, II, IV, and V) NOT DETECTED NOT DETECTED Final    Comment: Performed at [facility], [address] [place], [place] [address]    Coagulation Studies: No results for input(s): "LABPROT", "INR" in the last 72 hours.  Urinalysis: No results for input(s): "COLORURINE", "LABSPEC", "PHURINE", "GLUCOSEU", "HGBUR", "BILIRUBINUR", "KETONESUR", "PROTEINUR", "UROBILINOGEN", "NITRITE", "LEUKOCYTESUR" in the last 72 hours.  Invalid input(s): "APPERANCEUR"     Imaging: No results found.   Medications:    sodium bicarbonate 150 mEq in sterile water 1,150 mL infusion 100 mL/hr at 06/14/23 1414    azithromycin  500 mg Oral Daily   Chlorhexidine Gluconate Cloth  6 each Topical Daily   feeding supplement (NEPRO CARB STEADY)  237 mL Oral TID BM   heparin  5,000 Units Subcutaneous Q8H   sodium bicarbonate  650 mg Oral TID   sodium chloride flush  10-40 mL Intracatheter Q12H   diphenoxylate-atropine, ondansetron **OR** ondansetron (ZOFRAN) IV, sodium chloride flush  Assessment/ Plan:  Robin Daniels is a 68 y.o. 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  Acute kidney injury Acute kidney injury likely secondary to Severe ATN.  Creatinine continues to improve. Reports  adequate urine output. Agree with continuing IV hydration. Patient encouraged to maintain oral intake.  Lab Results  Component Value Date  CREATININE 1.51 (H) 06/14/2023   CREATININE 1.74 (H) 06/13/2023   CREATININE 2.71 (H) 06/12/2023       Intake/Output Summary (Last 24 hours) at 06/14/2023 1618 Last data filed at 06/13/2023 2114 Gross per 24 hour  Intake 10 ml  Output --  Net 10 ml      2.  Severe hypokalemia K 2.7 06/12/2023 Hypomagnesemia was also playing a role in hypokalemia initially.  Serum potassium 3.2. Patient received oral potassium today.    3.  Hyponatremia Sodium slowly improving 133   4. Hypomagnesemia  Magnesium corrected to 2.2   5.  Acute metabolic acidosis Serum bicarbonate remains 14. Will increase sodium bicarb infusion to 181ml/hr. Also receiving oral supplementation, sodium bicarbonate 650mg  three times daily.    6.  Anemia  Hemoglobin corrected to 8.1 with 1 unit blood transfusion.    LOS: 4 Robin Daniels 3/18/20254:18 PM

## 2023-06-14 NOTE — Progress Notes (Signed)
 Physical Therapy Treatment Patient Details Name: Robin Daniels MRN: [id number] DOB: [birth year] Today's Date: 06/14/2023   History of Present Illness Pt is a 68 y/o F admitted on 06/10/23 after presenting with c/o decreased p.o. intake, mild nausea. Pt is being treated for AKI, metabolic acidosis, hyponatremia & hypokalemia. PMH: Stage IV pancreatic CA, HTN, HLD, anemia, anxiety, vertigo    PT Comments  Patient long sitting in bed on arrival and agreeable to PT treatment session. Patient reports feeling a lot better this date compared to eval date. Completed bed mobility and sit to stand modI. Ambulated 200' with RW and supervision. Currently utilizing RW for support and energy conservation but otherwise stable. Discharge plan remains appropriate.     If plan is discharge home, recommend the following: A little help with walking and/or transfers;A little help with bathing/dressing/bathroom;Assistance with cooking/housework;Assist for transportation;Help with stairs or ramp for entrance   Can travel by private vehicle        Equipment Recommendations  BSC/3in1;Rolling Shoichi Mielke (2 wheels)    Recommendations for Other Services       Precautions / Restrictions Precautions Precautions: Fall Recall of Precautions/Restrictions: Intact Restrictions Weight Bearing Restrictions Per Provider Order: No     Mobility  Bed Mobility Overal bed mobility: Modified Independent                  Transfers Overall transfer level: Modified independent Equipment used: Rolling Brayant Dorr (2 wheels), None Transfers: Sit to/from Stand                  Ambulation/Gait Ambulation/Gait assistance: Supervision Gait Distance (Feet): 200 Feet Assistive device: Rolling Mauriah Mcmillen (2 wheels) Gait Pattern/deviations: Decreased stride length, Step-through pattern Gait velocity: decreased     General Gait Details: patient feeling a lot better today and able to complete full lap around nursing  statio   Stairs             Wheelchair Mobility     Tilt Bed    Modified Rankin (Stroke Patients Only)       Balance Overall balance assessment: Mild deficits observed, not formally tested                                          Communication Communication Communication: No apparent difficulties  Cognition Arousal: Alert Behavior During Therapy: WFL for tasks assessed/performed   PT - Cognitive impairments: No apparent impairments                         Following commands: Intact      Cueing    Exercises      General Comments        Pertinent Vitals/Pain Pain Assessment Pain Assessment: No/denies pain    Home Living                          Prior Function            PT Goals (current goals can now be found in the care plan section) Acute Rehab PT Goals Patient Stated Goal: get better PT Goal Formulation: With patient Time For Goal Achievement: 06/25/23 Potential to Achieve Goals: Good Progress towards PT goals: Progressing toward goals    Frequency    Min 2X/week      PT Plan      Co-evaluation  AM-PAC PT "6 Clicks" Mobility   Outcome Measure  Help needed turning from your back to your side while in a flat bed without using bedrails?: None Help needed moving from lying on your back to sitting on the side of a flat bed without using bedrails?: None Help needed moving to and from a bed to a chair (including a wheelchair)?: None Help needed standing up from a chair using your arms (e.g., wheelchair or bedside chair)?: None Help needed to walk in hospital room?: A Little Help needed climbing 3-5 steps with a railing? : A Lot 6 Click Score: 21    End of Session   Activity Tolerance: Patient tolerated treatment well Patient left: in bed;with call bell/phone within reach;with family/visitor present Nurse Communication: Mobility status PT Visit Diagnosis: Muscle weakness  (generalized) (M62.81);Unsteadiness on feet (R26.81)     Time: 1610-9604 PT Time Calculation (min) (ACUTE ONLY): 14 min  Charges:    $Therapeutic Activity: 8-22 mins PT General Charges $$ ACUTE PT VISIT: 1 Visit                     [name], PT, DPT Physical Therapist - [facility]  [facility]    Lasonya Hubner A Larell Baney 06/14/2023, 9:11 AM

## 2023-06-14 NOTE — Progress Notes (Signed)
 Robin Daniels   DOB:1955/08/10   EA#:[id number]    Pancreatic cancer/severe diarrhea on chemotherapy   Subjective: patient diarrhea is improved.  She had 1 loose bowel meant this morning.  Currently on octreotide IV infusion.  Accompanied by daughter.  Appetite improving.  No nausea.  Objective:  Vitals:   06/14/23 1645 06/14/23 2025  BP: 122/76 120/75  Pulse: 79 77  Resp:  18  Temp: 97.9 F (36.6 C) 97.9 F (36.6 C)  SpO2: 99% 100%     Intake/Output Summary (Last 24 hours) at 06/14/2023 2129 Last data filed at 06/14/2023 1929 Gross per 24 hour  Intake 120 ml  Output --  Net 120 ml   Cachectic appearing African-American female patient   Physical Exam Vitals and nursing note reviewed.  HENT:     Head: Normocephalic and atraumatic.     Mouth/Throat:     Pharynx: Oropharynx is clear.  Eyes:     Extraocular Movements: Extraocular movements intact.     Pupils: Pupils are equal, round, and reactive to light.  Cardiovascular:     Rate and Rhythm: Normal rate and regular rhythm.  Pulmonary:     Comments: Decreased breath sounds bilaterally.  Abdominal:     Palpations: Abdomen is soft.  Musculoskeletal:        General: Normal range of motion.     Cervical back: Normal range of motion.  Skin:    General: Skin is warm.  Neurological:     General: No focal deficit present.     Mental Status: She is alert and oriented to person, place, and time.  Psychiatric:        Behavior: Behavior normal.        Judgment: Judgment normal.      Labs:  Lab Results  Component Value Date   WBC 9.1 06/14/2023   HGB 8.1 (L) 06/14/2023   HCT 23.4 (L) 06/14/2023   MCV 76.2 (L) 06/14/2023   PLT 333 06/14/2023   NEUTROABS 6.4 06/10/2023    Lab Results  Component Value Date   NA 135 06/14/2023   K 3.2 (L) 06/14/2023   CL 113 (H) 06/14/2023   CO2 14 (L) 06/14/2023    Studies:  No results found.   68 year old female patient with history of pancreatic cancer stage IV currently on  chemotherapy with NALIRI-is currently admitted to hospital for severe acute renal failure and also severe hypokalemia   # Metastatic pancreatic cancer-currently on chemotherapy with NALIRI s/p 5 cycles.  Mostly PET scan/CT scan concerning for progressive disease along with rising tumor markers.  Would recommend outpatient workup including MRI of the liver for further evaluation of the liver lesion.   # Severe acute renal failure/severe electrolyte abnormalities including hypokalemia-unclear etiology likely prerenal/ATN-followed by nephrology.  5-FU or irinotecan by themselves are not nephrotoxic.  Renal function improving.   # Diarrhea-likely secondary to irinotecan chemotherapy-prerenal/ATN.  GI stool PCR shows Campylobacter-on azithromycin as per the primary team.  Start Lomotil as needed for ongoing diarrhea .    # Anemia-hemoglobin around 6.6 multifactorial-recent chemotherapy/underlying possible thalassemia/and acute renal failure-hemoglobin s/p transfusion improved.  Hemoglobin around 8 today.  Earna Coder, MD 06/14/2023  9:29 PM

## 2023-06-14 NOTE — Progress Notes (Signed)
 PROGRESS NOTE    Robin Daniels  WJX:[id number] DOB: 1955/10/29 DOA: 06/10/2023 PCP: Louis Matte, MD  [id number]  LOS: 4 days   Brief hospital course:   Assessment & Plan: Robin Daniels is a 68 y.o. female with medical history significant of stage IV pancreatic cancer, hypertension, hyperlipidemia presenting with with failure to thrive, AKI.  Patient and family bedside report roughly 2 weeks of decreased p.o. intake, mild nausea.  No abdominal pain.  No diarrhea.  No fevers or chills.  Noted on active chemotherapy in the setting of stage IV pancreatic cancer.  Followed by Dr. Donneta Romberg outpatient.  Has not had chemotherapy in the past week secondary to weakness.    * AKI (acute kidney injury) (HCC) Severe AKI with noted creatinine going from 1-5 with GFR of less than 10 on presentation.  Markedly dry Noted baseline stage IV pancreatic cancer as well as active chemotherapy are confounding issues --cont MIVF as NaBicarb gtt, per nephro rec  Metabolic Acidosis --cont oral sodium bicarb (new) --cont MIVF as NaBicarb gtt, per nephro rec  Hyponatremia Sodium 126 on presentation --cont MIVF as NaBicarb gtt, per nephro rec  Hypokalemia --initially resistant to correction by supplementation due to Mag level being still low.  After mag level corrected, potassium level started responding to supplementation.  --monitor and supplement PRN with potassium powder  Hypomag --monitor and supplement with IV mag  Diarrhea 2/2 Campylobacter  --present for a while now, thought to be related to chemo.  Likely exacerbated the dehydration and electrolyte abnormalities.  GI path pos for Campylobacter. --start azithro  Cancer of head of pancreas (HCC) Baseline stage IV pancreatic cancer followed by Dr. Guy Sandifer outpatient On chemotherapy Noted concurrent failure to thrive in the setting of acute kidney injury today Prior hospice evaluation outpatient --onc consulted with Dr.  Althea Charon  HTN --BP soft.   --Hold home BP meds  HLD (hyperlipidemia) Continue statin  Chronic anemia likely due to chronic illness --Hgb 10.1 on presentation, has been decreasing --anemia workup showed no def. --1u pRBC for Hgb 6.6 --monitor Hgb   DVT prophylaxis: Heparin SQ Code Status: Full code  Family Communication:  Level of care: Telemetry Cardiac Dispo:   The patient is from: home Anticipated d/c is to: home Anticipated d/c date is: 1-2 days   Subjective and Interval History:  No new complaint today.   Objective: Vitals:   06/14/23 0540 06/14/23 0921 06/14/23 1126 06/14/23 1645  BP: 109/64 (!) 108/55 106/64 122/76  Pulse: 81 80 84 79  Resp: 20 16 16    Temp: 98 F (36.7 C) 97.9 F (36.6 C) 97.9 F (36.6 C) 97.9 F (36.6 C)  TempSrc:    Oral  SpO2: 100% 100% 100% 99%  Weight: 68.4 kg     Height:        Intake/Output Summary (Last 24 hours) at 06/14/2023 1804 Last data filed at 06/13/2023 2114 Gross per 24 hour  Intake 10 ml  Output --  Net 10 ml   Filed Weights   06/10/23 0846 06/11/23 2108 06/14/23 0540  Weight: 67 kg 66.2 kg 68.4 kg    Examination:   Constitutional: NAD, AAOx3 HEENT: conjunctivae and lids normal, EOMI CV: No cyanosis.   RESP: normal respiratory effort, on RA Neuro: II - XII grossly intact.   Psych: Normal mood and affect.  Appropriate judgement and reason   Data Reviewed: I have personally reviewed labs and imaging studies  Time spent: 50 minutes  Darlin Priestly, MD [facility] If 7PM-7AM, please contact night-coverage 06/14/2023, 6:04 PM

## 2023-06-15 DIAGNOSIS — N179 Acute kidney failure, unspecified: Secondary | ICD-10-CM | POA: Diagnosis not present

## 2023-06-15 LAB — BASIC METABOLIC PANEL
Anion gap: 3 — ABNORMAL LOW (ref 5–15)
BUN: 23 mg/dL (ref 8–23)
CO2: 23 mmol/L (ref 22–32)
Calcium: 7.5 mg/dL — ABNORMAL LOW (ref 8.9–10.3)
Chloride: 109 mmol/L (ref 98–111)
Creatinine, Ser: 1.32 mg/dL — ABNORMAL HIGH (ref 0.44–1.00)
GFR, Estimated: 43 mL/min — ABNORMAL LOW (ref >60)
Glucose, Bld: 121 mg/dL — ABNORMAL HIGH (ref 70–99)
Potassium: 2.4 mmol/L — CL (ref 3.5–5.1)
Sodium: 135 mmol/L (ref 135–145)

## 2023-06-15 LAB — CBC
HCT: 22.1 % — ABNORMAL LOW (ref 36.0–46.0)
Hemoglobin: 7.8 g/dL — ABNORMAL LOW (ref 12.0–15.0)
MCH: 26.6 pg (ref 26.0–34.0)
MCHC: 35.3 g/dL (ref 30.0–36.0)
MCV: 75.4 fL — ABNORMAL LOW (ref 80.0–100.0)
Platelets: 254 10*3/uL (ref 150–400)
RBC: 2.93 MIL/uL — ABNORMAL LOW (ref 3.87–5.11)
RDW: 19.7 % — ABNORMAL HIGH (ref 11.5–15.5)
WBC: 8.9 10*3/uL (ref 4.0–10.5)
nRBC: 0 % (ref 0.0–0.2)

## 2023-06-15 LAB — MAGNESIUM
Magnesium: 1.5 mg/dL — ABNORMAL LOW (ref 1.7–2.4)
Magnesium: 2.5 mg/dL — ABNORMAL HIGH (ref 1.7–2.4)

## 2023-06-15 LAB — POTASSIUM: Potassium: 3.1 mmol/L — ABNORMAL LOW (ref 3.5–5.1)

## 2023-06-15 MED ORDER — MAGNESIUM SULFATE 4 GM/100ML IV SOLN
4.0000 g | Freq: Once | INTRAVENOUS | Status: AC
  Administered 2023-06-15: 4 g via INTRAVENOUS
  Filled 2023-06-15: qty 100

## 2023-06-15 MED ORDER — SPIRONOLACTONE 25 MG PO TABS
25.0000 mg | ORAL_TABLET | Freq: Two times a day (BID) | ORAL | Status: DC
  Administered 2023-06-15 – 2023-06-16 (×4): 25 mg via ORAL
  Filled 2023-06-15 (×4): qty 1

## 2023-06-15 MED ORDER — POTASSIUM CHLORIDE 20 MEQ PO PACK
40.0000 meq | PACK | ORAL | Status: AC
  Administered 2023-06-15 (×2): 40 meq via ORAL
  Filled 2023-06-15 (×2): qty 2

## 2023-06-15 MED ORDER — POTASSIUM CHLORIDE 10 MEQ/100ML IV SOLN
10.0000 meq | INTRAVENOUS | Status: AC
  Administered 2023-06-15 (×2): 10 meq via INTRAVENOUS
  Filled 2023-06-15 (×2): qty 100

## 2023-06-15 MED ORDER — LACTATED RINGERS IV SOLN: INTRAVENOUS | Status: AC

## 2023-06-15 MED ORDER — ENSURE ENLIVE PO LIQD
237.0000 mL | Freq: Two times a day (BID) | ORAL | Status: DC
  Administered 2023-06-15: 237 mL via ORAL

## 2023-06-15 NOTE — Progress Notes (Signed)
 CRITICAL VALUE STICKER   CRITICAL VALUE: k+ 2.4   RECEIVER (on-site recipient of call): Takeisha Cianci,RN  DATE & TIME NOTIFIED: 06/15/23 6:13 AM   MESSENGER (representative from lab): Angelica Chessman   MD NOTIFIED: Webb Silversmith, NP               TIME OF NOTIFICATION: 6:18 AM   RESPONSE: see new orders; k+ replacement IV and PO

## 2023-06-15 NOTE — Plan of Care (Signed)
  Problem: Education: Goal: Knowledge of General Education information will improve Description: Including pain rating scale, medication(s)/side effects and non-pharmacologic comfort measures Outcome: Progressing   Problem: Health Behavior/Discharge Planning: Goal: Ability to manage health-related needs will improve Outcome: Progressing   Problem: Clinical Measurements: Goal: Ability to maintain clinical measurements within normal limits will improve Outcome: Progressing Goal: Will remain free from infection Outcome: Progressing Goal: Diagnostic test results will improve Outcome: Progressing Goal: Cardiovascular complication will be avoided Outcome: Progressing   Problem: Nutrition: Goal: Adequate nutrition will be maintained Outcome: Progressing   Problem: Coping: Goal: Level of anxiety will decrease Outcome: Progressing   Problem: Elimination: Goal: Will not experience complications related to bowel motility Outcome: Progressing Goal: Will not experience complications related to urinary retention Outcome: Progressing   Problem: Safety: Goal: Ability to remain free from injury will improve Outcome: Progressing

## 2023-06-15 NOTE — Progress Notes (Addendum)
 [facility]  ROUNDING NOTE   Subjective:  Robin Daniels is a 68 y.o. 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, depression    was admitted on 06/10/2023 for :   AKI (acute kidney injury) (HCC) [N17.9]  Update:  Patient seen sitting at side of bed Denies pain or discomfort Appetite has improved States diarrhea has slowed  Creatinine 1.32 Potassium 2.4 S bicarb 23   Objective:  Vital signs in last 24 hours:  Temp:  [97.8 F (36.6 C)-98.3 F (36.8 C)] 97.9 F (36.6 C) (03/19 1152) Pulse Rate:  [77-90] 77 (03/19 1152) Resp:  [18-20] 20 (03/19 0512) BP: (105-122)/(58-76) 105/64 (03/19 1152) SpO2:  [99 %-100 %] 100 % (03/19 1152)  Weight change:  Filed Weights   06/10/23 0846 06/11/23 2108 06/14/23 0540  Weight: 67 kg 66.2 kg 68.4 kg    Intake/Output: I/O last 3 completed shifts: In: 130 [I.V.:10; NG/GT:120] Out: -    Intake/Output this shift:  Total I/O In: 120 [P.O.:120] Out: -   Physical Exam: General: NAD  Head: Normocephalic, atraumatic. Moist oral mucosal membranes  Eyes: Anicteric  Lungs:  Clear to auscultation  Heart: Regular rate and rhythm  Abdomen:  Soft, nontender,   Extremities: No peripheral edema.  Neurologic: Alert, moving all four extremities  Skin: No lesions  Access: None    Basic Metabolic Panel: Recent Labs  Lab 06/10/23 0859 06/10/23 1509 06/11/23 1654 06/11/23 2004 06/12/23 0258 06/12/23 1704 06/13/23 0530 06/13/23 1713 06/14/23 0420 06/15/23 0510  NA  --    < > 126*  --  129*  --  133*  --  135 135  K  --    < > <2.0*   < > 2.7* 3.1* 2.8* 3.1* 3.2* 2.4*  CL  --    < > 92*  --  102  --  113*  --  113* 109  CO2  --    < > 20*  --  17*  --  14*  --  14* 23  GLUCOSE  --    < > 175*  --  119*  --  94  --  131* 121*  BUN  --    < > 62*  --  58*  --  45*  --  33* 23  CREATININE  --    < > 3.09*  --  2.71*  --  1.74*  --  1.51* 1.32*  CALCIUM  --    < > 7.8*   < > 7.7*  --  7.7*   --  8.1* 7.5*  MG 1.5*   < > 2.8*  --  2.7*  --  2.2  --  1.9 1.5*  PHOS 7.4*  --   --   --   --   --  2.1* 5.5*  --   --    < > = values in this interval not displayed.    Liver Function Tests: Recent Labs  Lab 06/10/23 0849 06/11/23 0450 06/11/23 2004  AST 20 21  --   ALT 32 22  --   ALKPHOS 127* 93  --   BILITOT 0.6 0.6  --   PROT 7.6 6.0*  --   ALBUMIN 3.3* 2.7* 2.7*   Recent Labs  Lab 06/10/23 0849  LIPASE 42   No results for input(s): "AMMONIA" in the last 168 hours.  CBC: Recent Labs  Lab 06/10/23 0849 06/11/23 0450 06/12/23 0258 06/13/23 0530 06/14/23  7846 06/15/23 0510  WBC 9.5 10.4 8.1 8.9 9.1 8.9  NEUTROABS 6.4  --   --   --   --   --   HGB 10.1* 8.0* 7.2* 6.6* 8.1* 7.8*  HCT 29.7* 22.2* 20.0* 18.8* 23.4* 22.1*  MCV 71.9* 70.9* 70.9* 74.0* 76.2* 75.4*  PLT 607* 380 347 327 333 254    Cardiac Enzymes: No results for input(s): "CKTOTAL", "CKMB", "CKMBINDEX", "TROPONINI" in the last 168 hours.  BNP: Invalid input(s): "POCBNP"  CBG: No results for input(s): "GLUCAP" in the last 168 hours.  Microbiology: Results for orders placed or performed during the hospital encounter of 06/10/23  Gastrointestinal Panel by PCR , Stool     Status: Abnormal   Collection Time: 06/13/23  9:25 PM   Specimen: Stool  Result Value Ref Range Status   Campylobacter species DETECTED (A) NOT DETECTED Final    Comment: RESULT CALLED TO, READ BACK BY AND VERIFIED WITH:  Robinette Haines AT 0235 06/14/23 JG    Plesimonas shigelloides NOT DETECTED NOT DETECTED Final   Salmonella species NOT DETECTED NOT DETECTED Final   Yersinia enterocolitica NOT DETECTED NOT DETECTED Final   Vibrio species NOT DETECTED NOT DETECTED Final   Vibrio cholerae NOT DETECTED NOT DETECTED Final   Enteroaggregative E coli (EAEC) NOT DETECTED NOT DETECTED Final   Enteropathogenic E coli (EPEC) NOT DETECTED NOT DETECTED Final   Enterotoxigenic E coli (ETEC) NOT DETECTED NOT DETECTED Final   Shiga  like toxin producing E coli (STEC) NOT DETECTED NOT DETECTED Final   Shigella/Enteroinvasive E coli (EIEC) NOT DETECTED NOT DETECTED Final   Cryptosporidium NOT DETECTED NOT DETECTED Final   Cyclospora cayetanensis NOT DETECTED NOT DETECTED Final   Entamoeba histolytica NOT DETECTED NOT DETECTED Final   Giardia lamblia NOT DETECTED NOT DETECTED Final   Adenovirus F40/41 NOT DETECTED NOT DETECTED Final   Astrovirus NOT DETECTED NOT DETECTED Final   Norovirus GI/GII NOT DETECTED NOT DETECTED Final   Rotavirus A NOT DETECTED NOT DETECTED Final   Sapovirus (I, II, IV, and V) NOT DETECTED NOT DETECTED Final    Comment: Performed at [facility], [address] [place], [place] [address]    Coagulation Studies: No results for input(s): "LABPROT", "INR" in the last 72 hours.  Urinalysis: No results for input(s): "COLORURINE", "LABSPEC", "PHURINE", "GLUCOSEU", "HGBUR", "BILIRUBINUR", "KETONESUR", "PROTEINUR", "UROBILINOGEN", "NITRITE", "LEUKOCYTESUR" in the last 72 hours.  Invalid input(s): "APPERANCEUR"     Imaging: No results found.   Medications:    lactated ringers 75 mL/hr at 06/15/23 1405    azithromycin  500 mg Oral Daily   Chlorhexidine Gluconate Cloth  6 each Topical Daily   feeding supplement  237 mL Oral BID BM   heparin  5,000 Units Subcutaneous Q8H   sodium bicarbonate  650 mg Oral TID   sodium chloride flush  10-40 mL Intracatheter Q12H   spironolactone  25 mg Oral BID   diphenoxylate-atropine, ondansetron **OR** ondansetron (ZOFRAN) IV, sodium chloride flush  Assessment/ Plan:  SHONDELL Daniels is a 68 y.o. 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  Acute kidney injury Acute kidney injury likely secondary to Severe ATN.  Creatinines to improve with IVF and improved oral intake. Will change IVF to lactated ringers.  Lab  Results  Component Value Date   CREATININE 1.32 (H) 06/15/2023  CREATININE 1.51 (H) 06/14/2023   CREATININE 1.74 (H) 06/13/2023       Intake/Output Summary (Last 24 hours) at 06/15/2023 1447 Last data filed at 06/15/2023 1415 Gross per 24 hour  Intake 240 ml  Output --  Net 240 ml      2.  Severe hypokalemia K 2.7 06/12/2023 Hypokalemia. Continues to require supplementation. Would recommend IV supplementation. As kidney function improves and along with decreased stools, hopeful electrolytes will stabilize. Will order Spironolactone 25mg  twice daily.    3.  Hyponatremia Sodium 135   4. Hypomagnesemia  Magnesium 1.5, IV 4g supplementation ordered.    5.  Acute metabolic acidosis Serum bicarbonate corrected to 23. Will transition off sodium bicarb infusion.     6.  Anemia  Hemoglobin 7.8. Will monitor. Patient has received blood transfusion during this admission.    LOS: 5 Jotham Ahn 3/19/20252:47 PM

## 2023-06-15 NOTE — Progress Notes (Signed)
  PROGRESS NOTE    Robin Daniels  HQI:[id number] DOB: 29-Nov-1955 DOA: 06/10/2023 PCP: Louis Matte, MD  [id number]  LOS: 5 days   Brief hospital course:   Assessment & Plan: Robin Daniels is a 68 y.o. female with medical history significant of stage IV pancreatic cancer, hypertension, hyperlipidemia presenting with with failure to thrive, AKI.  Patient and family bedside report roughly 2 weeks of decreased p.o. intake, mild nausea.  No abdominal pain.  No diarrhea.  No fevers or chills.  Noted on active chemotherapy in the setting of stage IV pancreatic cancer.  Followed by Dr. Donneta Romberg outpatient.  Has not had chemotherapy in the past week secondary to weakness.    * AKI (acute kidney injury) (HCC) Severe AKI with noted creatinine going from 1-5 with GFR of less than 10 on presentation.  Markedly dry Noted baseline stage IV pancreatic cancer as well as active chemotherapy are confounding issues --cont MIVF as LR, per nephro  Metabolic Acidosis --cont oral sodium bicarb (new) --d/c NaBicarb gtt  Hyponatremia Sodium 126 on presentation, normalized with IVF  Hypokalemia, persistent --initially resistant to correction by supplementation due to Mag level being still low.  After mag level corrected, potassium level started responding to supplementation.  --monitor and supplement PRN with potassium powder  Hypomag --monitor and supplement with IV mag  Diarrhea 2/2 Campylobacter  --present for a while now, thought to be related to chemo.  Likely exacerbated the dehydration and electrolyte abnormalities.  GI path pos for Campylobacter. --cont Azithro  Cancer of head of pancreas (HCC) Baseline stage IV pancreatic cancer followed by Dr. Guy Sandifer outpatient On chemotherapy Noted concurrent failure to thrive in the setting of acute kidney injury today Prior hospice evaluation outpatient --onc consulted with Dr. Althea Charon  HTN --BP soft.   --Hold home BP meds  HLD  (hyperlipidemia) Continue statin  Chronic anemia likely due to chronic illness --Hgb 10.1 on presentation, has been decreasing --anemia workup showed no def. --1u pRBC for Hgb 6.6 --monitor Hgb   DVT prophylaxis: Heparin SQ Code Status: Full code  Family Communication:  Level of care: Telemetry Cardiac Dispo:   The patient is from: home Anticipated d/c is to: home Anticipated d/c date is: 2-3 days   Subjective and Interval History:  Pt reported diarrhea improved.   Objective: Vitals:   06/15/23 0512 06/15/23 0802 06/15/23 1152 06/15/23 1726  BP: 115/60 (!) 114/58 105/64 127/74  Pulse: 79 90 77 78  Resp: 20     Temp: 98.2 F (36.8 C) 97.8 F (36.6 C) 97.9 F (36.6 C) 97.8 F (36.6 C)  TempSrc:    Oral  SpO2: 100% 100% 100% 100%  Weight:      Height:        Intake/Output Summary (Last 24 hours) at 06/15/2023 1949 Last data filed at 06/15/2023 1415 Gross per 24 hour  Intake 120 ml  Output --  Net 120 ml   Filed Weights   06/10/23 0846 06/11/23 2108 06/14/23 0540  Weight: 67 kg 66.2 kg 68.4 kg    Examination:   Constitutional: NAD, AAOx3 HEENT: conjunctivae and lids normal, EOMI CV: No cyanosis.   RESP: normal respiratory effort, on RA Neuro: II - XII grossly intact.   Psych: Normal mood and affect.  Appropriate judgement and reason   Data Reviewed: I have personally reviewed labs and imaging studies  Time spent: 35 minutes  Darlin Priestly, MD [facility] If 7PM-7AM, please contact night-coverage 06/15/2023, 7:49 PM

## 2023-06-16 DIAGNOSIS — N179 Acute kidney failure, unspecified: Secondary | ICD-10-CM | POA: Diagnosis not present

## 2023-06-16 LAB — CBC
HCT: 21.9 % — ABNORMAL LOW (ref 36.0–46.0)
Hemoglobin: 7.7 g/dL — ABNORMAL LOW (ref 12.0–15.0)
MCH: 26.6 pg (ref 26.0–34.0)
MCHC: 35.2 g/dL (ref 30.0–36.0)
MCV: 75.5 fL — ABNORMAL LOW (ref 80.0–100.0)
Platelets: 250 10*3/uL (ref 150–400)
RBC: 2.9 MIL/uL — ABNORMAL LOW (ref 3.87–5.11)
RDW: 19.9 % — ABNORMAL HIGH (ref 11.5–15.5)
WBC: 9.4 10*3/uL (ref 4.0–10.5)
nRBC: 0 % (ref 0.0–0.2)

## 2023-06-16 LAB — BASIC METABOLIC PANEL
Anion gap: 6 (ref 5–15)
BUN: 15 mg/dL (ref 8–23)
CO2: 22 mmol/L (ref 22–32)
Calcium: 8 mg/dL — ABNORMAL LOW (ref 8.9–10.3)
Chloride: 109 mmol/L (ref 98–111)
Creatinine, Ser: 1.12 mg/dL — ABNORMAL HIGH (ref 0.44–1.00)
GFR, Estimated: 53 mL/min — ABNORMAL LOW (ref >60)
Glucose, Bld: 105 mg/dL — ABNORMAL HIGH (ref 70–99)
Potassium: 3.2 mmol/L — ABNORMAL LOW (ref 3.5–5.1)
Sodium: 137 mmol/L (ref 135–145)

## 2023-06-16 LAB — MAGNESIUM: Magnesium: 2.1 mg/dL (ref 1.7–2.4)

## 2023-06-16 LAB — POTASSIUM: Potassium: 3.4 mmol/L — ABNORMAL LOW (ref 3.5–5.1)

## 2023-06-16 MED ORDER — POTASSIUM CHLORIDE 20 MEQ PO PACK
40.0000 meq | PACK | ORAL | Status: AC
  Administered 2023-06-16 (×4): 40 meq via ORAL
  Filled 2023-06-16 (×4): qty 2

## 2023-06-16 NOTE — Progress Notes (Signed)
 [facility]  ROUNDING NOTE   Subjective:  Robin Daniels is a 68 y.o. 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, depression    was admitted on 06/10/2023 for :   AKI (acute kidney injury) (HCC) [N17.9]  Update:  Patient seen sitting at bedside Appetite decent  Creatinine 1.12 Potassium 3.2 S bicarb 22   Objective:  Vital signs in last 24 hours:  Temp:  [97.8 F (36.6 C)-98.5 F (36.9 C)] 98.1 F (36.7 C) (03/20 1714) Pulse Rate:  [80-88] 88 (03/20 1714) Resp:  [16-18] 16 (03/20 0422) BP: (117-126)/(61-77) 119/74 (03/20 1714) SpO2:  [98 %-100 %] 99 % (03/20 1714)  Weight change:  Filed Weights   06/10/23 0846 06/11/23 2108 06/14/23 0540  Weight: 67 kg 66.2 kg 68.4 kg    Intake/Output: I/O last 3 completed shifts: In: 2275.9 [P.O.:600; I.V.:1675.9] Out: -    Intake/Output this shift:  Total I/O In: 548.2 [I.V.:548.2] Out: -   Physical Exam: General: NAD  Head: Normocephalic, atraumatic. Moist oral mucosal membranes  Eyes: Anicteric  Lungs:  Clear to auscultation  Heart: Regular rate and rhythm  Abdomen:  Soft, nontender  Extremities: No peripheral edema.  Neurologic: Alert, moving all four extremities  Skin: No lesions  Access: None    Basic Metabolic Panel: Recent Labs  Lab 06/10/23 0859 06/10/23 1509 06/12/23 0258 06/12/23 1704 06/13/23 0530 06/13/23 1713 06/14/23 0420 06/15/23 0510 06/15/23 1700 06/16/23 0443 06/16/23 1700  NA  --    < > 129*  --  133*  --  135 135  --  137  --   K  --    < > 2.7*   < > 2.8* 3.1* 3.2* 2.4* 3.1* 3.2* 3.4*  CL  --    < > 102  --  113*  --  113* 109  --  109  --   CO2  --    < > 17*  --  14*  --  14* 23  --  22  --   GLUCOSE  --    < > 119*  --  94  --  131* 121*  --  105*  --   BUN  --    < > 58*  --  45*  --  33* 23  --  15  --   CREATININE  --    < > 2.71*  --  1.74*  --  1.51* 1.32*  --  1.12*  --   CALCIUM  --    < > 7.7*  --  7.7*  --  8.1* 7.5*  --  8.0*   --   MG 1.5*   < > 2.7*  --  2.2  --  1.9 1.5* 2.5* 2.1  --   PHOS 7.4*  --   --   --  2.1* 5.5*  --   --   --   --   --    < > = values in this interval not displayed.    Liver Function Tests: Recent Labs  Lab 06/10/23 0849 06/11/23 0450 06/11/23 2004  AST 20 21  --   ALT 32 22  --   ALKPHOS 127* 93  --   BILITOT 0.6 0.6  --   PROT 7.6 6.0*  --   ALBUMIN 3.3* 2.7* 2.7*   Recent Labs  Lab 06/10/23 0849  LIPASE 42   No results for input(s): "AMMONIA" in the last 168 hours.  CBC: Recent Labs  Lab 06/10/23 0849 06/11/23 0450 06/12/23 0258 06/13/23 0530 06/14/23 0420 06/15/23 0510 06/16/23 0443  WBC 9.5   < > 8.1 8.9 9.1 8.9 9.4  NEUTROABS 6.4  --   --   --   --   --   --   HGB 10.1*   < > 7.2* 6.6* 8.1* 7.8* 7.7*  HCT 29.7*   < > 20.0* 18.8* 23.4* 22.1* 21.9*  MCV 71.9*   < > 70.9* 74.0* 76.2* 75.4* 75.5*  PLT 607*   < > 347 327 333 254 250   < > = values in this interval not displayed.    Cardiac Enzymes: No results for input(s): "CKTOTAL", "CKMB", "CKMBINDEX", "TROPONINI" in the last 168 hours.  BNP: Invalid input(s): "POCBNP"  CBG: No results for input(s): "GLUCAP" in the last 168 hours.  Microbiology: Results for orders placed or performed during the hospital encounter of 06/10/23  Gastrointestinal Panel by PCR , Stool     Status: Abnormal   Collection Time: 06/13/23  9:25 PM   Specimen: Stool  Result Value Ref Range Status   Campylobacter species DETECTED (A) NOT DETECTED Final    Comment: RESULT CALLED TO, READ BACK BY AND VERIFIED WITH:  Robinette Haines AT 0235 06/14/23 JG    Plesimonas shigelloides NOT DETECTED NOT DETECTED Final   Salmonella species NOT DETECTED NOT DETECTED Final   Yersinia enterocolitica NOT DETECTED NOT DETECTED Final   Vibrio species NOT DETECTED NOT DETECTED Final   Vibrio cholerae NOT DETECTED NOT DETECTED Final   Enteroaggregative E coli (EAEC) NOT DETECTED NOT DETECTED Final   Enteropathogenic E coli (EPEC) NOT  DETECTED NOT DETECTED Final   Enterotoxigenic E coli (ETEC) NOT DETECTED NOT DETECTED Final   Shiga like toxin producing E coli (STEC) NOT DETECTED NOT DETECTED Final   Shigella/Enteroinvasive E coli (EIEC) NOT DETECTED NOT DETECTED Final   Cryptosporidium NOT DETECTED NOT DETECTED Final   Cyclospora cayetanensis NOT DETECTED NOT DETECTED Final   Entamoeba histolytica NOT DETECTED NOT DETECTED Final   Giardia lamblia NOT DETECTED NOT DETECTED Final   Adenovirus F40/41 NOT DETECTED NOT DETECTED Final   Astrovirus NOT DETECTED NOT DETECTED Final   Norovirus GI/GII NOT DETECTED NOT DETECTED Final   Rotavirus A NOT DETECTED NOT DETECTED Final   Sapovirus (I, II, IV, and V) NOT DETECTED NOT DETECTED Final    Comment: Performed at [facility], [address] [place], [place] [address]    Coagulation Studies: No results for input(s): "LABPROT", "INR" in the last 72 hours.  Urinalysis: No results for input(s): "COLORURINE", "LABSPEC", "PHURINE", "GLUCOSEU", "HGBUR", "BILIRUBINUR", "KETONESUR", "PROTEINUR", "UROBILINOGEN", "NITRITE", "LEUKOCYTESUR" in the last 72 hours.  Invalid input(s): "APPERANCEUR"     Imaging: No results found.   Medications:      azithromycin  500 mg Oral Daily   Chlorhexidine Gluconate Cloth  6 each Topical Daily   feeding supplement  237 mL Oral BID BM   heparin  5,000 Units Subcutaneous Q8H   potassium chloride  40 mEq Oral Q4H   sodium bicarbonate  650 mg Oral TID   sodium chloride flush  10-40 mL Intracatheter Q12H   spironolactone  25 mg Oral BID   diphenoxylate-atropine, ondansetron **OR** ondansetron (ZOFRAN) IV, sodium chloride flush  Assessment/ Plan:  Robin Daniels is a 68 y.o. 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, depression    was admitted on 06/10/2023 for :   AKI (  acute kidney injury) (HCC) [N17.9]   1.  Acute kidney injury Acute kidney injury likely secondary to Severe  ATN.  Creatinine has returned to baseline. Appetite appropriately, can stop IVF. Patient cleared to discharge from renal stance. Will defer to primary team.   Lab Results  Component Value Date   CREATININE 1.12 (H) 06/16/2023   CREATININE 1.32 (H) 06/15/2023   CREATININE 1.51 (H) 06/14/2023       Intake/Output Summary (Last 24 hours) at 06/16/2023 1805 Last data filed at 06/16/2023 1505 Gross per 24 hour  Intake 2704.13 ml  Output --  Net 2704.13 ml      2.  Severe hypokalemia K 2.7 06/12/2023 Potassium 3.2 today.  Continue Spironolactone 25mg  twice daily.    3.  Hyponatremia Sodium 137   4. Hypomagnesemia  Magnesium 2.1 after IV supplementation yesterday   5.  Acute metabolic acidosis Serum bicarbonate 22    6.  Anemia  Hemoglobin 7.7. Patient has received blood transfusion during this admission.    LOS: 6 Gibran Veselka 3/20/20256:05 PM

## 2023-06-16 NOTE — Progress Notes (Signed)
 Physical Therapy Treatment Patient Details Name: Robin Daniels MRN: [id number] DOB: November 23, 1955 Today's Date: 06/16/2023   History of Present Illness Pt is a 68 y/o F admitted on 06/10/23 after presenting with c/o decreased p.o. intake, mild nausea. Pt is being treated for AKI, metabolic acidosis, hyponatremia & hypokalemia. PMH: Stage IV pancreatic CA, HTN, HLD, anemia, anxiety, vertigo    PT Comments  Patient semi reclined in bed on arrival and agreeable to PT session. Ambulated 200' with no AD and supervision-CGA with patient reaching for railing in hallway for comfort. X1 minor LOB during turning requiring CGA but patient able to self correct. Tolerated session well with no complaints of fatigue. Discharge plan remains appropriate.     If plan is discharge home, recommend the following: A little help with walking and/or transfers;A little help with bathing/dressing/bathroom;Assistance with cooking/housework;Assist for transportation;Help with stairs or ramp for entrance   Can travel by private vehicle        Equipment Recommendations  BSC/3in1;Rolling [name] (2 wheels)    Recommendations for Other Services       Precautions / Restrictions Precautions Precautions: Fall Recall of Precautions/Restrictions: Intact Restrictions Weight Bearing Restrictions Per Provider Order: No     Mobility  Bed Mobility Overal bed mobility: Modified Independent                  Transfers Overall transfer level: Needs assistance Equipment used: None Transfers: Sit to/from Stand Sit to Stand: Supervision                Ambulation/Gait Ambulation/Gait assistance: Supervision, Contact guard assist Gait Distance (Feet): 200 Feet Assistive device: None Gait Pattern/deviations: Decreased stride length, Step-through pattern Gait velocity: decreased     General Gait Details: reaching for hallway railing throughout for comfort. CGA for x1 minor LOB but able to self  correct.   Stairs             Wheelchair Mobility     Tilt Bed    Modified Rankin (Stroke Patients Only)       Balance Overall balance assessment: Mild deficits observed, not formally tested                                          Communication Communication Communication: No apparent difficulties  Cognition Arousal: Alert Behavior During Therapy: WFL for tasks assessed/performed   PT - Cognitive impairments: No apparent impairments                                Cueing    Exercises      General Comments        Pertinent Vitals/Pain Pain Assessment Pain Assessment: No/denies pain    Home Living                          Prior Function            PT Goals (current goals can now be found in the care plan section) Acute Rehab PT Goals Patient Stated Goal: get better PT Goal Formulation: With patient Time For Goal Achievement: 06/25/23 Potential to Achieve Goals: Good Progress towards PT goals: Progressing toward goals    Frequency    Min 2X/week      PT Plan      Co-evaluation  AM-PAC PT "6 Clicks" Mobility   Outcome Measure  Help needed turning from your back to your side while in a flat bed without using bedrails?: None Help needed moving from lying on your back to sitting on the side of a flat bed without using bedrails?: None Help needed moving to and from a bed to a chair (including a wheelchair)?: None Help needed standing up from a chair using your arms (e.g., wheelchair or bedside chair)?: None Help needed to walk in hospital room?: A Little Help needed climbing 3-5 steps with a railing? : A Little 6 Click Score: 22    End of Session   Activity Tolerance: Patient tolerated treatment well Patient left: in bed;with call bell/phone within reach;with family/visitor present Nurse Communication: Mobility status PT Visit Diagnosis: Muscle weakness (generalized)  (M62.81);Unsteadiness on feet (R26.81)     Time: 4696-2952 PT Time Calculation (min) (ACUTE ONLY): 10 min  Charges:    $Therapeutic Activity: 8-22 mins PT General Charges $$ ACUTE PT VISIT: 1 Visit                     Robin Daniels, PT, DPT Physical Therapist - [facility]  [facility]    Robin Daniels 06/16/2023, 10:26 AM

## 2023-06-16 NOTE — Progress Notes (Signed)
 Occupational Therapy Treatment Patient Details Name: Robin Daniels MRN: [id number] DOB: December 08, 1955 Today's Date: 06/16/2023   History of present illness Pt is a 68 y/o F admitted on 06/10/23 after presenting with c/o decreased p.o. intake, mild nausea. Pt is being treated for AKI, metabolic acidosis, hyponatremia & hypokalemia. PMH: Stage IV pancreatic CA, HTN, HLD, anemia, anxiety, vertigo   OT comments  Chart reviewed, pt greeted sitting on edge of bed, reporting she has amb multiple times today, continues to feel better. Pt trailed LB dressing, required MIN A for donning sock, discussed use of AE for safe ADL completion including use of a reacher, sock aid, shower chair, bsc. Pt reports she has lots of family to assist with ADLs as needed. Pt is left as received, all needs met. OT will continue to follow.       If plan is discharge home, recommend the following:  A little help with walking and/or transfers;A little help with bathing/dressing/bathroom;Assistance with cooking/housework;Assist for transportation;Help with stairs or ramp for entrance   Equipment Recommendations  BSC/3in1;Tub/shower seat    Recommendations for Other Services      Precautions / Restrictions Precautions Precautions: Fall Recall of Precautions/Restrictions: Intact Restrictions Weight Bearing Restrictions Per Provider Order: No       Mobility Bed Mobility Overal bed mobility: Modified Independent             General bed mobility comments: pt sitting on edge of bed, reports she amb with PT and family, politely declines further amb    Transfers Overall transfer level: Needs assistance Equipment used: None Transfers: Sit to/from Stand Sit to Stand: Supervision                 Balance Overall balance assessment: Mild deficits observed, not formally tested Sitting-balance support: Feet supported Sitting balance-Leahy Scale: Normal                                     ADL  either performed or assessed with clinical judgement   ADL Overall ADL's : Needs assistance/impaired                                       General ADL Comments: supervison for doffing socks, MIN A for donning socks; discussed use of AE, pt politely declined    Extremity/Trunk Assessment              Vision       Perception     Praxis     Communication Communication Communication: No apparent difficulties   Cognition Arousal: Alert Behavior During Therapy: WFL for tasks assessed/performed Cognition: No apparent impairments                               Following commands: Intact        Cueing   Cueing Techniques: Verbal cues  Exercises Other Exercises Other Exercises: edu pt/son re: Role of OT, role of rehab, AE for increased ease of ADL completion    Shoulder Instructions       General Comments vss throughout    Pertinent Vitals/ Pain       Pain Assessment Pain Assessment: No/denies pain  Home Living  Prior Functioning/Environment              Frequency  Min 2X/week        Progress Toward Goals  OT Goals(current goals can now be found in the care plan section)  Progress towards OT goals: Progressing toward goals  Acute Rehab OT Goals Time For Goal Achievement: 06/25/23  Plan      Co-evaluation                 AM-PAC OT "6 Clicks" Daily Activity     Outcome Measure   Help from another person eating meals?: None Help from another person taking care of personal grooming?: A Little Help from another person toileting, which includes using toliet, bedpan, or urinal?: A Little Help from another person bathing (including washing, rinsing, drying)?: A Lot Help from another person to put on and taking off regular upper body clothing?: A Little Help from another person to put on and taking off regular lower body clothing?: A Little 6 Click Score:  18    End of Session    OT Visit Diagnosis: Unsteadiness on feet (R26.81);Muscle weakness (generalized) (M62.81)   Activity Tolerance Patient tolerated treatment well   Patient Left Other (comment) (sitting on edge of bed with family present)   Nurse Communication Mobility status        Time: 1610-9604 OT Time Calculation (min): 8 min  Charges: OT General Charges $OT Visit: 1 Visit OT Treatments $Self Care/Home Management : 8-22 mins  Oleta Mouse, OTD OTR/L  06/16/23, 3:06 PM

## 2023-06-16 NOTE — Progress Notes (Signed)
  PROGRESS NOTE    Robin Daniels  GEX:[id number] DOB: 29-May-1955 DOA: 06/10/2023 PCP: Louis Matte, MD  [id number]  LOS: 6 days   Brief hospital course:   Assessment & Plan: Robin Daniels is a 68 y.o. female with medical history significant of stage IV pancreatic cancer, hypertension, hyperlipidemia presenting with with failure to thrive, AKI.  Patient and family bedside report roughly 2 weeks of decreased p.o. intake, mild nausea.  No abdominal pain.  No diarrhea.  No fevers or chills.  Noted on active chemotherapy in the setting of stage IV pancreatic cancer.  Followed by Dr. Donneta Romberg outpatient.  Has not had chemotherapy in the past week secondary to weakness.    * AKI (acute kidney injury) (HCC) Severe AKI with noted creatinine going from 1-5 with GFR of less than 10 on presentation.  Markedly dry Noted baseline stage IV pancreatic cancer as well as active chemotherapy are confounding issues --d/c MIVF today  Metabolic Acidosis --cont oral NaBicarb  Hyponatremia Sodium 126 on presentation, normalized with IVF  Hypokalemia, persistent --initially resistant to correction by supplementation due to Mag level being still low.  After mag level corrected, potassium level started responding to supplementation.  --monitor and supplement PRN with potassium powder --cont spironolactone  Hypomag --monitor and supplement with IV mag  Diarrhea 2/2 Campylobacter  --present for a while now, thought to be related to chemo.  Likely exacerbated the dehydration and electrolyte abnormalities.  GI path pos for Campylobacter. --cont Azithro  Cancer of head of pancreas (HCC) Baseline stage IV pancreatic cancer followed by Dr. Guy Sandifer outpatient On chemotherapy Noted concurrent failure to thrive in the setting of acute kidney injury today Prior hospice evaluation outpatient --onc consulted with Dr. Althea Charon  HTN --BP soft.   --cont spironolactone  HLD  (hyperlipidemia) Continue statin  Chronic anemia likely due to chronic illness --Hgb 10.1 on presentation, has been decreasing --anemia workup showed no def. --1u pRBC for Hgb 6.6 --monitor Hgb   DVT prophylaxis: Heparin SQ Code Status: Full code  Family Communication: daughter updated at bedside today Level of care: Med-Surg Dispo:   The patient is from: home Anticipated d/c is to: home Anticipated d/c date is: tomorrow   Subjective and Interval History:  Pt reported diarrhea improved.  Eager to go home.   Objective: Vitals:   06/16/23 0812 06/16/23 1127 06/16/23 1714 06/16/23 2017  BP: 126/70 117/61 119/74 128/65  Pulse: 82 80 88 86  Resp:    18  Temp: 97.8 F (36.6 C) 97.9 F (36.6 C) 98.1 F (36.7 C) 98 F (36.7 C)  TempSrc:      SpO2: 100% 100% 99% 100%  Weight:      Height:        Intake/Output Summary (Last 24 hours) at 06/16/2023 2102 Last data filed at 06/16/2023 2004 Gross per 24 hour  Intake 2944.13 ml  Output --  Net 2944.13 ml   Filed Weights   06/10/23 0846 06/11/23 2108 06/14/23 0540  Weight: 67 kg 66.2 kg 68.4 kg    Examination:   Constitutional: NAD, AAOx3 HEENT: conjunctivae and lids normal, EOMI CV: No cyanosis.   RESP: normal respiratory effort, on RA Neuro: II - XII grossly intact.   Psych: Normal mood and affect.  Appropriate judgement and reason   Data Reviewed: I have personally reviewed labs and imaging studies  Time spent: 35 minutes  Darlin Priestly, MD [facility] If 7PM-7AM, please contact night-coverage 06/16/2023, 9:02 PM

## 2023-06-16 NOTE — Plan of Care (Signed)
  Problem: Health Behavior/Discharge Planning: Goal: Ability to manage health-related needs will improve Outcome: Progressing   Problem: Clinical Measurements: Goal: Will remain free from infection Outcome: Progressing Goal: Respiratory complications will improve Outcome: Progressing Goal: Cardiovascular complication will be avoided Outcome: Progressing   Problem: Coping: Goal: Level of anxiety will decrease Outcome: Progressing   Problem: Elimination: Goal: Will not experience complications related to urinary retention Outcome: Progressing   Problem: Pain Managment: Goal: General experience of comfort will improve and/or be controlled Outcome: Progressing   Problem: Safety: Goal: Ability to remain free from injury will improve Outcome: Progressing   Problem: Skin Integrity: Goal: Risk for impaired skin integrity will decrease Outcome: Progressing

## 2023-06-17 ENCOUNTER — Other Ambulatory Visit: Payer: Self-pay | Admitting: Internal Medicine

## 2023-06-17 ENCOUNTER — Encounter: Payer: Self-pay | Admitting: Internal Medicine

## 2023-06-17 ENCOUNTER — Other Ambulatory Visit: Payer: Self-pay

## 2023-06-17 DIAGNOSIS — N179 Acute kidney failure, unspecified: Secondary | ICD-10-CM | POA: Diagnosis not present

## 2023-06-17 LAB — BASIC METABOLIC PANEL
Anion gap: 7 (ref 5–15)
BUN: 9 mg/dL (ref 8–23)
CO2: 18 mmol/L — ABNORMAL LOW (ref 22–32)
Calcium: 8.2 mg/dL — ABNORMAL LOW (ref 8.9–10.3)
Chloride: 110 mmol/L (ref 98–111)
Creatinine, Ser: 1 mg/dL (ref 0.44–1.00)
GFR, Estimated: >60 mL/min (ref >60)
Glucose, Bld: 104 mg/dL — ABNORMAL HIGH (ref 70–99)
Potassium: 3.8 mmol/L (ref 3.5–5.1)
Sodium: 135 mmol/L (ref 135–145)

## 2023-06-17 LAB — CBC
HCT: 24.1 % — ABNORMAL LOW (ref 36.0–46.0)
Hemoglobin: 8.4 g/dL — ABNORMAL LOW (ref 12.0–15.0)
MCH: 26.4 pg (ref 26.0–34.0)
MCHC: 34.9 g/dL (ref 30.0–36.0)
MCV: 75.8 fL — ABNORMAL LOW (ref 80.0–100.0)
Platelets: 252 10*3/uL (ref 150–400)
RBC: 3.18 MIL/uL — ABNORMAL LOW (ref 3.87–5.11)
RDW: 20.3 % — ABNORMAL HIGH (ref 11.5–15.5)
WBC: 9 10*3/uL (ref 4.0–10.5)
nRBC: 0 % (ref 0.0–0.2)

## 2023-06-17 LAB — MAGNESIUM: Magnesium: 1.7 mg/dL (ref 1.7–2.4)

## 2023-06-17 MED ORDER — GABAPENTIN 600 MG PO TABS: 600.0000 mg | ORAL_TABLET | Freq: Every day | ORAL | Status: DC | PRN

## 2023-06-17 MED ORDER — SODIUM BICARBONATE 325 MG PO TABS
650.0000 mg | ORAL_TABLET | Freq: Three times a day (TID) | ORAL | 0 refills | Status: AC
  Filled 2023-06-17: qty 180, 30d supply, fill #0

## 2023-06-17 MED ORDER — AZITHROMYCIN 500 MG PO TABS: 500.0000 mg | ORAL_TABLET | Freq: Every day | ORAL | 0 refills | Status: DC

## 2023-06-17 MED ORDER — AZITHROMYCIN 500 MG PO TABS
500.0000 mg | ORAL_TABLET | Freq: Every day | ORAL | 0 refills | Status: AC
  Filled 2023-06-17: qty 1, 1d supply, fill #0

## 2023-06-17 MED ORDER — ENSURE ENLIVE PO LIQD: 237.0000 mL | Freq: Two times a day (BID) | ORAL | Status: DC

## 2023-06-17 MED ORDER — SPIRONOLACTONE 25 MG PO TABS: 25.0000 mg | ORAL_TABLET | Freq: Two times a day (BID) | ORAL | 0 refills | Status: DC

## 2023-06-17 MED ORDER — POTASSIUM CHLORIDE 20 MEQ PO PACK: 40.0000 meq | PACK | Freq: Once | ORAL | Status: DC

## 2023-06-17 MED ORDER — SPIRONOLACTONE 25 MG PO TABS
25.0000 mg | ORAL_TABLET | Freq: Two times a day (BID) | ORAL | 0 refills | Status: DC
  Filled 2023-06-17: qty 60, 30d supply, fill #0

## 2023-06-17 MED ORDER — POTASSIUM CHLORIDE 20 MEQ PO PACK: 40.0000 meq | PACK | Freq: Two times a day (BID) | ORAL | 0 refills | Status: DC

## 2023-06-17 MED ORDER — POTASSIUM CHLORIDE 20 MEQ PO PACK
40.0000 meq | PACK | Freq: Two times a day (BID) | ORAL | 0 refills | Status: DC
  Filled 2023-06-17 (×2): qty 28, 7d supply, fill #0

## 2023-06-17 MED ORDER — SODIUM BICARBONATE 650 MG PO TABS: 650.0000 mg | ORAL_TABLET | Freq: Three times a day (TID) | ORAL | 0 refills | Status: DC

## 2023-06-17 NOTE — Plan of Care (Signed)

## 2023-06-17 NOTE — Progress Notes (Signed)
 Patient given discharge instructions and port deaccessed, patient taken to discharge lounge.

## 2023-06-17 NOTE — Discharge Summary (Signed)
 Physician Discharge Summary   Robin Daniels  female DOB: 04/02/1955  RUE:[id number]  PCP: Louis Matte, MD  Admit date: 06/10/2023 Discharge date: 06/17/2023  Admitted From: home Disposition:  home Home Health: Yes CODE STATUS: Full code  Discharge Instructions     Discharge instructions   Complete by: As directed    Please take potassium supplement twice a day for 1 week, and then follow up with outpatient nephrology in about 1 week.  You are started on spironolactone to help retain potassium.  Your home amlodipine and Lisinopril are discontinued, and Toprol on hold due to your blood pressure being low normal.  You have Campylobacter infection causing diarrhea.  Please finish 1 more day of antibiotic azithromycin on 3/22. [facility] Course:  For full details, please see H&P, progress notes, consult notes and ancillary notes.  Briefly,  Robin Daniels is a 68 y.o. female with medical history significant of stage IV pancreatic cancer, hypertension, presenting with with failure to thrive, AKI.    Patient and family bedside report roughly 2 weeks of decreased p.o. intake, mild nausea.  Noted on active chemotherapy in the setting of stage IV pancreatic cancer.  Followed by Dr. Donneta Romberg outpatient.  Has not had chemotherapy in the past week secondary to weakness.    * AKI (acute kidney injury) (HCC) Severe AKI with noted creatinine going from 1 to 5.01 on presentation.  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 --Pt received MIVF with Cr gradually improved to 1 prior to discharge.   Metabolic Acidosis --likely had RTA caused by chemo.  Received intermittent NaBicarb infusion.  Started on oral NaBicarb which is continued after discharge pending outpatient nephro f/u.   Hyponatremia Sodium 126 on presentation, normalized with IVF   Hypokalemia, persistent --initially resistant to correction by supplementation due to Mag level being still low.  After  mag level corrected, potassium level started responding to supplementation.  --monitored and supplemented PRN with potassium powder --started on spironolactone by nephro for potassium retention. --discharged on potassium 40 mEq BID for 7 days, with f/u with nephro in 1 week to check level.   Hypomag --monitored and supplemented with IV mag   Diarrhea 2/2 Campylobacter infection --present for a while now prior to admission, initially thought to be related to chemo.  Likely exacerbated the dehydration and electrolyte abnormalities.  GI path pos for Campylobacter. --started on Azithro with improvement in diarrhea, discharged to finish 1 more day of azithro at home.   Cancer of head of pancreas (HCC) Baseline stage IV pancreatic cancer followed by Dr. Guy Sandifer outpatient On chemotherapy --onc consulted with Dr. Althea Charon   HTN --BP soft.   --started on spironolactone by nephro for potassium retention. --d/c'ed home amlodipine and Lisinopril --Hold home Toprol due to soft BP   Chronic anemia likely due to chronic illness --Hgb 10.1 on presentation, has been decreasing --anemia workup showed no def. --1u pRBC for Hgb 6.6   Unless noted above, medications under "STOP" list are ones pt was not taking PTA.  Discharge Diagnoses:  Principal Problem:   AKI (acute kidney injury) (HCC) Active Problems:   Hyponatremia   HLD (hyperlipidemia)   Hypertension goal BP (blood pressure) < 140/90   Hypokalemia   Cancer of head of pancreas [facility])   Palliative care encounter   30 Day Unplanned Readmission Risk Score    Flowsheet Row ED to Hosp-Admission (Current) from 06/10/2023 in [facility] PCU  30 Day Unplanned Readmission Risk Score (%) 19.84 Filed at 06/17/2023 0801       This score is the patient's risk of an unplanned readmission within 30 days of being discharged (0 -100%). The score is based on dignosis, age, lab data, medications, orders, and past utilization.    Low:  0-14.9   Medium: 15-21.9   High: 22-29.9   Extreme: 30 and above         Discharge Instructions:  Allergies as of 06/17/2023   No Known Allergies      Medication List     PAUSE taking these medications    metoprolol succinate 50 MG 24 hr tablet Wait to take this until your doctor or other care provider tells you to start again. Due low blood pressure. Commonly known as: TOPROL-XL Take 1 tablet (50 mg total) by mouth daily. Take with or immediately following a meal.       STOP taking these medications    amLODipine 10 MG tablet Commonly known as: NORVASC   CALCIUM 600+D PLUS MINERALS PO   DULoxetine 30 MG capsule Commonly known as: CYMBALTA   lisinopril 10 MG tablet Commonly known as: ZESTRIL   OLANZapine 5 MG tablet Commonly known as: ZYPREXA   potassium & sodium phosphates 280-160-250 MG Pack Commonly known as: PHOS-NAK       TAKE these medications    acetaminophen 325 MG tablet Commonly known as: TYLENOL Take 650 mg by mouth every 6 (six) hours as needed for moderate pain.   azithromycin 500 MG tablet Commonly known as: ZITHROMAX Take 1 tablet (500 mg total) by mouth daily for 1 day. Start taking on: June 18, 2023   cyanocobalamin 1000 MCG tablet Commonly known as: VITAMIN B12 Take 1 tablet (1,000 mcg total) by mouth daily.   diphenoxylate-atropine 2.5-0.025 MG tablet Commonly known as: LOMOTIL Take 1 tablet by mouth 4 (four) times daily as needed for diarrhea or loose stools. Take it along with immodium   feeding supplement Liqd Take 237 mLs by mouth 2 (two) times daily between meals.   Ferrous Bisglycinate Chelate 28 MG Caps Take 1 tablet by mouth daily.   gabapentin 600 MG tablet Commonly known as: NEURONTIN Take 1 tablet (600 mg total) by mouth daily as needed. Home med. What changed:  when to take this reasons to take this additional instructions   lidocaine-prilocaine cream Commonly known as: EMLA Apply on the port.  30 -45 min  prior to port access.   magic mouthwash (multi-ingredient) oral suspension Swish and swallow 5-10 mLs by mouth 4 (four) times daily as needed.   ondansetron 8 MG tablet Commonly known as: ZOFRAN One pill every 8 hours as needed for nausea/vomitting.   potassium chloride 20 MEQ packet Commonly known as: KLOR-CON Take 40 mEq by mouth 2 (two) times daily for 7 days.   prochlorperazine 10 MG tablet Commonly known as: COMPAZINE Take 1 tablet (10 mg total) by mouth every 6 (six) hours as needed for nausea or vomiting.   sodium bicarbonate 650 MG tablet Take 1 tablet (650 mg total) by mouth 3 (three) times daily.   spironolactone 25 MG tablet Commonly known as: ALDACTONE Take 1 tablet (25 mg total) by mouth 2 (two) times daily.   traMADol 50 MG tablet Commonly known as: ULTRAM Take 1 tablet (50 mg total) by mouth every 8 (eight) hours as needed.         Follow-up Information     Lateef, Munsoor,  MD Follow up in 1 week(s).   Specialty: Nephrology Contact information: [address] [place] [address] [phone]                 No Known Allergies   The results of significant diagnostics from this hospitalization (including imaging, microbiology, ancillary and laboratory) are listed below for reference.   Consultations:   Procedures/Studies: CT ABDOMEN PELVIS WO CONTRAST Result Date: 06/10/2023 CLINICAL DATA:  68 year old female with a history of abdominal pain EXAM: CT ABDOMEN AND PELVIS WITHOUT CONTRAST TECHNIQUE: Multidetector CT imaging of the abdomen and pelvis was performed following the standard protocol without IV contrast. RADIATION DOSE REDUCTION: This exam was performed according to the departmental dose-optimization program which includes automated exposure control, adjustment of the mA and/or kV according to patient size and/or use of iterative reconstruction technique. COMPARISON:  CT 01/25/2023, PET-CT 05/24/2023 FINDINGS: Lower  chest: Mild centrilobular nodularity within the medial right lower lobe Hepatobiliary: Pneumobilia. Focal 14 mm hypodensity the lower liver, poorly characterized on the noncontrast CT though appears new from the prior PET. Redemonstration cholecystectomy, pancreatico duodenectomy. Unchanged position of the stent at the biliary anastomosis. The surgical site is poorly characterized with the absence of contrast. Pancreas: Surgical changes of pancreatico duodenectomy, stent at the anastomosis. Surgical site is poorly characterized with the absence of contrast. Spleen: Unremarkable Adrenals/Urinary Tract: - Right adrenal gland:  Unremarkable - Left adrenal gland: Unremarkable. - Right kidney: No hydronephrosis, nephrolithiasis, inflammation, or ureteral dilation. - Left Kidney: No hydronephrosis, nephrolithiasis, inflammation, or ureteral dilation. - Urinary Bladder: Unremarkable. Stomach/Bowel: Redemonstration surgical changes at the stomach, surgical changes of pancreatico duodenectomy. Relatively unremarkable small bowel. The length of the colon is fluid-filled, without wall thickening or significant focal inflammatory changes. No evidence of obstruction as the fluid extends through the rectum. Vascular/Lymphatic: Atherosclerotic changes of the abdominal aorta, mesenteric arteries, renal arteries, iliac arteries. Small mesenteric lymph nodes nonspecific. Reproductive: Unremarkable uterus/adnexa Other: Periumbilical fat. Musculoskeletal: No acute displaced fracture. Degenerative changes of the thoracolumbar spine. IMPRESSION: The length of colon is fluid-filled, nonspecific finding though potentially representing enteritis and/or colitis. No evidence of obstruction, and no focal inflammation. Redemonstration of surgical changes of pancreatico duodenectomy. The surgical site is poorly characterized with the absence of contrast. Pattern of centrilobular nodularity in the medial right lower lobe compatible with  infectious/inflammatory change. **An incidental finding of potential clinical significance has been found. Focal hypodensity of the right liver measuring 14 mm. A focal liver lesion cannot be excluded and in the setting of the patient's known prior pancreatic cancer, follow-up with oncology is recommended to determine further imaging strategy such as contrast MRI or CT.** Aortic Atherosclerosis (ICD10-I70.0). Additional ancillary findings as above Electronically Signed   By: Gilmer Mor D.O.   On: 06/10/2023 15:57   DG Chest 2 View Result Date: 06/10/2023 CLINICAL DATA:  Lethargy, failure to thrive. EXAM: CHEST - 2 VIEW COMPARISON:  July 15, 2020.  May 24, 2023. FINDINGS: The heart size and mediastinal contours are within normal limits. 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 No definite consolidative process is noted. Right internal jugular Port-A-Cath is unchanged. The visualized skeletal structures are unremarkable. IMPRESSION: No active cardiopulmonary disease. Electronically Signed   By: Lupita Raider M.D.   On: 06/10/2023 11:03   NM PET Image Restage (PS) Skull Base to Thigh (F-18 FDG) Result Date: 06/08/2023 CLINICAL DATA:  Subsequent treatment strategy for pancreatic cancer. EXAM: NUCLEAR MEDICINE  PET SKULL BASE TO THIGH TECHNIQUE: 7.83 mCi F-18 FDG was injected intravenously. Full-ring PET imaging was performed from the skull base to thigh after the radiotracer. CT data was obtained and used for attenuation correction and anatomic localization. Fasting blood glucose: 100 mg/dl COMPARISON:  Prior PET CTs 02/25/2023 and 11/09/2022. FINDINGS: Mediastinal blood pool activity: SUV max 2.99 Liver activity: SUV max NA NECK: No hypermetabolic lymph nodes in the neck. There is a small focus of hypermetabolism in the left thyroid lobe. This corresponds to an 8 mm nodule on the prior CT scan. Recommend thyroid US (ref: J Am Coll Radiol. 2015  Feb;12(2): 143-50). Incidental CT findings: Bilateral carotid artery calcifications. CHEST: No hypermetabolic mediastinal or hilar nodes. No suspicious pulmonary nodules on the CT scan. No hypermetabolic breast masses, supraclavicular or axillary adenopathy. Incidental CT findings: Stable atherosclerotic calcifications involving the aorta and coronary arteries. 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 ABDOMEN/PELVIS: Persistent focus of hypermetabolism noted near the anastomosis and surrounding the vascular clips or sutures posterior to the pancreatic duct drain. SUV max is 8.98. This area was smaller and had an SUV max of 8.36 on the prior PET-CT. Second hypermetabolic focus just to the left of the celiac axis has an SUV max of 4.24. This area was much larger on the prior study and had an SUV max of 7.30. On the prior study there was a third focus of hypermetabolism chest to the left of the SMA which corresponded to a small lymph node. Do not see any measurable lymph node or any residual hypermetabolism on today study. Persistent hypermetabolism associated with the antral region of the stomach possibly related to reflux from the Whipple procedure. No new hypermetabolic foci to suggest hepatic metastatic disease. No omental or peritoneal implants or new areas of abdominal or pelvic adenopathy. Incidental CT findings: Stable surgical changes. The pancreatic duct stent appears stable. Minimal scattered atherosclerotic calcifications. Stable periumbilical abdominal wall hernia containing fat. No free fluid. SKELETON: No findings suspicious for osseous metastatic disease. Incidental CT findings: None. IMPRESSION: 1. Persistent focus of hypermetabolism (8.98 versus previous 8.36) near the anastomosis and surrounding the vascular clips or sutures posterior to the pancreatic duct drain. This area appears larger. 2. Second hypermetabolic focus just to the left of the celiac axis has an SUV max of 4.24. This area was  much larger on the prior study and had an SUV max of 7.30. 3. On the prior study there was a third focus of hypermetabolism just to the left of the SMA which corresponded to a small lymph node. I do not see any measurable lymph node or any residual hypermetabolism on today study. 4. No findings to suggest hepatic metastatic disease. 5. No findings for metastatic disease involving the chest or pelvis. 6. Small focus of hypermetabolism in the left thyroid lobe. This corresponds to an 8 mm nodule on the prior CT scan. Recommend thyroid US. 7. Aortic atherosclerosis. Electronically Signed   By: Rudie Meyer M.D.   On: 06/08/2023 17:19      Labs: BNP (last 3 results) No results for input(s): "BNP" in the last 8760 hours. Basic Metabolic Panel: Recent Labs  Lab 06/13/23 0530 06/13/23 1713 06/14/23 0420 06/15/23 0510 06/15/23 1700 06/16/23 0443 06/16/23 1700 06/17/23 0519  NA 133*  --  135 135  --  137  --  135  K 2.8* 3.1* 3.2* 2.4* 3.1* 3.2* 3.4* 3.8  CL 113*  --  113* 109  --  109  --  110  CO2 14*  --  14* 23  --  22  --  18*  GLUCOSE 94  --  131* 121*  --  105*  --  104*  BUN 45*  --  33* 23  --  15  --  9  CREATININE 1.74*  --  1.51* 1.32*  --  1.12*  --  1.00  CALCIUM 7.7*  --  8.1* 7.5*  --  8.0*  --  8.2*  MG 2.2  --  1.9 1.5* 2.5* 2.1  --  1.7  PHOS 2.1* 5.5*  --   --   --   --   --   --    Liver Function Tests: Recent Labs  Lab 06/11/23 0450 06/11/23 2004  AST 21  --   ALT 22  --   ALKPHOS 93  --   BILITOT 0.6  --   PROT 6.0*  --   ALBUMIN 2.7* 2.7*   No results for input(s): "LIPASE", "AMYLASE" in the last 168 hours. No results for input(s): "AMMONIA" in the last 168 hours. CBC: Recent Labs  Lab 06/13/23 0530 06/14/23 0420 06/15/23 0510 06/16/23 0443 06/17/23 0519  WBC 8.9 9.1 8.9 9.4 9.0  HGB 6.6* 8.1* 7.8* 7.7* 8.4*  HCT 18.8* 23.4* 22.1* 21.9* 24.1*  MCV 74.0* 76.2* 75.4* 75.5* 75.8*  PLT 327 333 254 250 252   Cardiac Enzymes: No results for  input(s): "CKTOTAL", "CKMB", "CKMBINDEX", "TROPONINI" in the last 168 hours. BNP: Invalid input(s): "POCBNP" CBG: No results for input(s): "GLUCAP" in the last 168 hours. D-Dimer No results for input(s): "DDIMER" in the last 72 hours. Hgb A1c No results for input(s): "HGBA1C" in the last 72 hours. Lipid Profile No results for input(s): "CHOL", "HDL", "LDLCALC", "TRIG", "CHOLHDL", "LDLDIRECT" in the last 72 hours. Thyroid function studies No results for input(s): "TSH", "T4TOTAL", "T3FREE", "THYROIDAB" in the last 72 hours.  Invalid input(s): "FREET3" Anemia work up No results for input(s): "VITAMINB12", "FOLATE", "FERRITIN", "TIBC", "IRON", "RETICCTPCT" in the last 72 hours. Urinalysis    Component Value Date/Time   COLORURINE YELLOW (A) 06/11/2023 0223   APPEARANCEUR HAZY (A) 06/11/2023 0223   LABSPEC 1.012 06/11/2023 0223   PHURINE 5.0 06/11/2023 0223   GLUCOSEU NEGATIVE 06/11/2023 0223   HGBUR NEGATIVE 06/11/2023 0223   BILIRUBINUR NEGATIVE 06/11/2023 0223   KETONESUR NEGATIVE 06/11/2023 0223   PROTEINUR 30 (A) 06/11/2023 0223   NITRITE NEGATIVE 06/11/2023 0223   LEUKOCYTESUR NEGATIVE 06/11/2023 0223   Sepsis Labs Recent Labs  Lab 06/14/23 0420 06/15/23 0510 06/16/23 0443 06/17/23 0519  WBC 9.1 8.9 9.4 9.0   Microbiology Recent Results (from the past 240 hours)  Gastrointestinal Panel by PCR , Stool     Status: Abnormal   Collection Time: 06/13/23  9:25 PM   Specimen: Stool  Result Value Ref Range Status   Campylobacter species DETECTED (A) NOT DETECTED Final    Comment: RESULT CALLED TO, READ BACK BY AND VERIFIED WITH:  Robinette Haines AT 0235 06/14/23 JG    Plesimonas shigelloides NOT DETECTED NOT DETECTED Final   Salmonella species NOT DETECTED NOT DETECTED Final   Yersinia enterocolitica NOT DETECTED NOT DETECTED Final   Vibrio species NOT DETECTED NOT DETECTED Final   Vibrio cholerae NOT DETECTED NOT DETECTED Final   Enteroaggregative E coli (EAEC) NOT  DETECTED NOT DETECTED Final   Enteropathogenic E coli (EPEC) NOT DETECTED NOT DETECTED Final   Enterotoxigenic E coli (ETEC) NOT DETECTED NOT DETECTED Final   Shiga like  toxin producing E coli (STEC) NOT DETECTED NOT DETECTED Final   Shigella/Enteroinvasive E coli (EIEC) NOT DETECTED NOT DETECTED Final   Cryptosporidium NOT DETECTED NOT DETECTED Final   Cyclospora cayetanensis NOT DETECTED NOT DETECTED Final   Entamoeba histolytica NOT DETECTED NOT DETECTED Final   Giardia lamblia NOT DETECTED NOT DETECTED Final   Adenovirus F40/41 NOT DETECTED NOT DETECTED Final   Astrovirus NOT DETECTED NOT DETECTED Final   Norovirus GI/GII NOT DETECTED NOT DETECTED Final   Rotavirus A NOT DETECTED NOT DETECTED Final   Sapovirus (I, II, IV, and V) NOT DETECTED NOT DETECTED Final    Comment: Performed at [facility] [address] [place], [place] [address]     Total time spend on discharging this patient, including the last patient exam, discussing the hospital stay, instructions for ongoing care as it relates to all pertinent caregivers, as well as preparing the medical discharge records, prescriptions, and/or referrals as applicable, is 35 minutes.    Darlin Priestly, MD  [facility] 06/17/2023, 9:32 AM

## 2023-06-17 NOTE — Progress Notes (Signed)
 Given possible progression of disease-/and recent admission to hospital -HOLD chemo.   Cancel infusions appt for next week- Keep MD/labs-   GB

## 2023-06-17 NOTE — TOC Initial Note (Signed)
 Transition of Care [facility]) - Initial/Assessment Note    Patient Details  Name: Robin Daniels MRN: [id number] Date of Birth: 10/09/1955  Transition of Care [facility]) CM/SW Contact:    Liliana Cline, LCSW Phone Number: 06/17/2023, 10:27 AM  Clinical Narrative:                 CSW spoke with patient and daughter who is at bedside Nigel Bridgeman) Patient lives with her mother and son. Daughter to transport home. PCP is Dr. Sherrin Daisy. Pharmacy is [facility]. Patient uses a rollator. Confirmed home address in chart. Patient and daughter are aware of rec for RW, 3in1, and HH. They decline 3in1 and RW but are agreeable to [facility]. Explained agency options, they declined agency preferences. Referral made to [facility] with [facility] for PT and OT. Asked MD for orders.   Expected Discharge Plan: Home w Home Health Services Barriers to Discharge: Barriers Resolved   Patient Goals and CMS Choice   CMS Medicare.gov Compare Post Acute Care list provided to:: Patient Choice offered to / list presented to : Patient, Adult Children      Expected Discharge Plan and Services       Living arrangements for the past 2 months: Single Family Home Expected Discharge Date: 06/17/23                         HH Arranged: PT, OT HH Agency: [facility] Date [facility] Agency Contacted: 06/17/23   Representative spoke with at [facility] Agency: Kandee Keen  Prior Living Arrangements/Services Living arrangements for the past 2 months: Single Family Home Lives with:: Parents, Adult Children Patient language and need for interpreter reviewed:: Yes Do you feel safe going back to the place where you live?: Yes      Need for Family Participation in Patient Care: Yes (Comment) Care giver support system in place?: Yes (comment) Current home services: DME Criminal Activity/Legal Involvement Pertinent to Current Situation/Hospitalization: No - Comment as needed  Activities of Daily Living   ADL Screening (condition at time of  admission) Independently performs ADLs?: Yes (appropriate for developmental age) Is the patient deaf or have difficulty hearing?: No Does the patient have difficulty seeing, even when wearing glasses/contacts?: No Does the patient have difficulty concentrating, remembering, or making decisions?: No  Permission Sought/Granted Permission sought to share information with : Facility Industrial/product designer granted to share information with : Yes, Verbal Permission Granted              Emotional Assessment       Orientation: : Oriented to Self, Oriented to Place, Oriented to  Time, Oriented to Situation Alcohol / Substance Use: Not Applicable Psych Involvement: No (comment)  Admission diagnosis:  Hypokalemia [E87.6] AKI (acute kidney injury) (HCC) [N17.9] Acute renal failure, unspecified acute renal failure type (HCC) [N17.9] Patient Active Problem List   Diagnosis Date Noted   Palliative care encounter 06/13/2023   AKI (acute kidney injury) (HCC) 06/10/2023   Hyponatremia 06/10/2023   Anemia due to antineoplastic chemotherapy 04/01/2020   Cancer of head of pancreas (HCC) 12/04/2019   Hypokalemia 11/14/2019   Elevated liver enzymes 11/14/2019   Bilateral primary osteoarthritis of hip 09/12/2019   Obesity 12/07/2015   Heart palpitations 02/18/2015   Hypertension goal BP (blood pressure) < 140/90 10/17/2014   Pure hypercholesterolemia 10/17/2014   H/O total knee replacement 10/17/2014   Anxiety 09/11/2014   HLD (hyperlipidemia) 09/11/2014   Migraine without aura and responsive  to treatment 09/11/2014   Nocturnal cough 09/11/2014   Anemia, iron deficiency 09/11/2014   Plantar fasciitis 09/11/2014   IFG (impaired fasting glucose) 09/11/2014   Avitaminosis D 09/11/2014   Absence of interventricular septum 09/11/2014   PCP:  Sherrin Daisy Danton Clap, MD Pharmacy:   [facility] [address], [place] - [address] [place] [address] Phone:  [phone] Fax: [phone]  [facility] [address] [place] [address] Phone: [phone] Fax: [phone]     Social Drivers of Health (SDOH) Social History: SDOH Screenings   Food Insecurity: No Food Insecurity (06/10/2023)  Housing: Low Risk  (06/10/2023)  Transportation Needs: No Transportation Needs (06/10/2023)  Utilities: Not At Risk (06/10/2023)  Depression (PHQ2-9): Low Risk  (12/01/2022)  Financial Resource Strain: Low Risk  (12/01/2022)  Social Connections: Moderately Isolated (06/10/2023)  Tobacco Use: Low Risk  (06/10/2023)   SDOH Interventions:     Readmission Risk Interventions    06/17/2023   10:24 AM  Readmission Risk Prevention Plan  Transportation Screening Complete  PCP or Specialist Appt within 5-7 Days Complete  Home Care Screening Complete  Medication Review (RN CM) Complete

## 2023-06-21 ENCOUNTER — Other Ambulatory Visit: Payer: Self-pay

## 2023-06-21 DIAGNOSIS — C250 Malignant neoplasm of head of pancreas: Secondary | ICD-10-CM

## 2023-06-22 ENCOUNTER — Inpatient Hospital Stay: Payer: 59

## 2023-06-22 ENCOUNTER — Encounter: Payer: Self-pay | Admitting: Internal Medicine

## 2023-06-22 ENCOUNTER — Other Ambulatory Visit: Payer: Self-pay | Admitting: Internal Medicine

## 2023-06-22 ENCOUNTER — Inpatient Hospital Stay

## 2023-06-22 ENCOUNTER — Inpatient Hospital Stay (HOSPITAL_BASED_OUTPATIENT_CLINIC_OR_DEPARTMENT_OTHER): Payer: 59 | Admitting: Internal Medicine

## 2023-06-22 ENCOUNTER — Telehealth: Payer: Self-pay | Admitting: *Deleted

## 2023-06-22 ENCOUNTER — Telehealth: Payer: Self-pay

## 2023-06-22 VITALS — BP 136/90 | HR 121 | Temp 97.4°F | Resp 18 | Wt 149.0 lb

## 2023-06-22 DIAGNOSIS — K769 Liver disease, unspecified: Secondary | ICD-10-CM | POA: Diagnosis not present

## 2023-06-22 DIAGNOSIS — Z95828 Presence of other vascular implants and grafts: Secondary | ICD-10-CM

## 2023-06-22 DIAGNOSIS — D509 Iron deficiency anemia, unspecified: Secondary | ICD-10-CM | POA: Diagnosis not present

## 2023-06-22 DIAGNOSIS — F32A Depression, unspecified: Secondary | ICD-10-CM | POA: Diagnosis not present

## 2023-06-22 DIAGNOSIS — G4700 Insomnia, unspecified: Secondary | ICD-10-CM | POA: Diagnosis not present

## 2023-06-22 DIAGNOSIS — R000 Tachycardia, unspecified: Secondary | ICD-10-CM | POA: Diagnosis not present

## 2023-06-22 DIAGNOSIS — M549 Dorsalgia, unspecified: Secondary | ICD-10-CM | POA: Diagnosis not present

## 2023-06-22 DIAGNOSIS — E876 Hypokalemia: Secondary | ICD-10-CM | POA: Diagnosis not present

## 2023-06-22 DIAGNOSIS — C250 Malignant neoplasm of head of pancreas: Secondary | ICD-10-CM

## 2023-06-22 DIAGNOSIS — I10 Essential (primary) hypertension: Secondary | ICD-10-CM | POA: Diagnosis not present

## 2023-06-22 DIAGNOSIS — F419 Anxiety disorder, unspecified: Secondary | ICD-10-CM | POA: Diagnosis not present

## 2023-06-22 DIAGNOSIS — Z90411 Acquired partial absence of pancreas: Secondary | ICD-10-CM | POA: Diagnosis not present

## 2023-06-22 DIAGNOSIS — Z79899 Other long term (current) drug therapy: Secondary | ICD-10-CM | POA: Diagnosis not present

## 2023-06-22 LAB — CBC WITH DIFFERENTIAL (CANCER CENTER ONLY)
Abs Immature Granulocytes: 0.1 10*3/uL — ABNORMAL HIGH (ref 0.00–0.07)
Basophils Absolute: 0.1 10*3/uL (ref 0.0–0.1)
Basophils Relative: 1 %
Eosinophils Absolute: 0 10*3/uL (ref 0.0–0.5)
Eosinophils Relative: 1 %
HCT: 25.5 % — ABNORMAL LOW (ref 36.0–46.0)
Hemoglobin: 8.6 g/dL — ABNORMAL LOW (ref 12.0–15.0)
Immature Granulocytes: 1 %
Lymphocytes Relative: 21 %
Lymphs Abs: 1.7 10*3/uL (ref 0.7–4.0)
MCH: 26.4 pg (ref 26.0–34.0)
MCHC: 33.7 g/dL (ref 30.0–36.0)
MCV: 78.2 fL — ABNORMAL LOW (ref 80.0–100.0)
Monocytes Absolute: 0.6 10*3/uL (ref 0.1–1.0)
Monocytes Relative: 7 %
Neutro Abs: 5.4 10*3/uL (ref 1.7–7.7)
Neutrophils Relative %: 69 %
Platelet Count: 238 10*3/uL (ref 150–400)
RBC: 3.26 MIL/uL — ABNORMAL LOW (ref 3.87–5.11)
RDW: 19.8 % — ABNORMAL HIGH (ref 11.5–15.5)
WBC Count: 7.8 10*3/uL (ref 4.0–10.5)
nRBC: 0 % (ref 0.0–0.2)

## 2023-06-22 LAB — CMP (CANCER CENTER ONLY)
ALT: 15 U/L (ref 0–44)
AST: 16 U/L (ref 15–41)
Albumin: 2.8 g/dL — ABNORMAL LOW (ref 3.5–5.0)
Alkaline Phosphatase: 108 U/L (ref 38–126)
Anion gap: 10 (ref 5–15)
BUN: 9 mg/dL (ref 8–23)
CO2: 18 mmol/L — ABNORMAL LOW (ref 22–32)
Calcium: 8.6 mg/dL — ABNORMAL LOW (ref 8.9–10.3)
Chloride: 102 mmol/L (ref 98–111)
Creatinine: 1.08 mg/dL — ABNORMAL HIGH (ref 0.44–1.00)
GFR, Estimated: 55 mL/min — ABNORMAL LOW (ref >60)
Glucose, Bld: 119 mg/dL — ABNORMAL HIGH (ref 70–99)
Potassium: 5 mmol/L (ref 3.5–5.1)
Sodium: 130 mmol/L — ABNORMAL LOW (ref 135–145)
Total Bilirubin: 0.8 mg/dL (ref 0.0–1.2)
Total Protein: 6.5 g/dL (ref 6.5–8.1)

## 2023-06-22 MED ORDER — OLANZAPINE 5 MG PO TABS: 5.0000 mg | ORAL_TABLET | Freq: Every day | ORAL | 2 refills | Status: DC

## 2023-06-22 MED ORDER — HEPARIN SOD (PORK) LOCK FLUSH 100 UNIT/ML IV SOLN
500.0000 [IU] | Freq: Once | INTRAVENOUS | Status: AC
  Administered 2023-06-22: 500 [IU] via INTRAVENOUS
  Filled 2023-06-22: qty 5

## 2023-06-22 MED ORDER — SODIUM CHLORIDE 0.9% FLUSH
10.0000 mL | Freq: Once | INTRAVENOUS | Status: AC
  Administered 2023-06-22: 10 mL via INTRAVENOUS
  Filled 2023-06-22: qty 10

## 2023-06-22 NOTE — Telephone Encounter (Signed)
 Robin Daniels  will access the port and take it out and that is good and Dr, Donneta Romberg agrees. I told her it was good per md

## 2023-06-22 NOTE — Telephone Encounter (Signed)
 Records for referral faxed to [facility] faxed to [phone]

## 2023-06-22 NOTE — Progress Notes (Signed)
 [facility] for AKI 06/17/23, CT ABD/PEL 06/10/23.

## 2023-06-22 NOTE — Progress Notes (Signed)
 Patient here for follow up. Patient states she is feeling much better. Denies any concerns this morning,

## 2023-06-22 NOTE — Assessment & Plan Note (Addendum)
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     #  OCT 29th, 2024- CT CAP/ PET scan: progression.   Intense metabolic activity at the junction of the pancreas and duodenum not changed from prior. New hypermetabolic tissue position between the pancreas in the SMA is concerning for nodal metastasis;  New smaller focus hypermotility just RIGHT of the takeoff of the SMA is also concerning for nodal metastasis; . overall concern for progression of local peripancreatic nodal metastasis.  Patient currently s/p  NALIRI chemotherapy #5 cycles-   # MARCH 2025-  PET  Persistent focus of hypermetabolism (8.98 versus previous 8.36) near the anastomosis and surrounding the vascular clips or sutures posterior to the pancreatic duct drain. This area appears larger.  Second hypermetabolic focus just to the left of the celiac axis has an SUV max of 4.24. This area was much larger on the prior study and had an SUV max of 7.30.  On the prior study there was a third focus of hypermetabolism just to the left of the SMA which corresponded to a small lymph node. I do not see any measurable lymph node or any residual hypermetabolism on today study. No findings to suggest hepatic metastatic disease  # However noncontrast CT scan done in the hospital- Focal hypodensity of the right liver measuring 14 mm. Patients is recommended   further imaging strategy such as contrast MRI of the liver for further evaluation.  # However given the rising cancer marker and the lack of significant response noted on the PET scan-patient likely has progression of disease/based on imaging.  Also given poor tolerance/lack of response to current chemotherapy with NALIRI-I recommend discontinuation of current therapy. # referral to [facility]-re: pancreatic cancer/clinical trial  # MARCH 2025- acute renal failure/hypokalemia-attributed to prerenal/diarrhea- sec to  chemo/ campylobacter [s/p azithromcyin]- checking UGTA-1 gennotype today.   # Severe Hypokalemia- Potassium  3.4-  improved today- 5-stop potassium supplementation.  Will recheck again in 2 weeks.  # Tachycardia-120s; off metoprolol-restart metoprolol.   # back pain-  secondary to progressive disease:improved- currently on tramadol prn.  stable.    # Anemia- microcytic- Stable 9-10; NO IDA- ;  colo [EUS- 2021; oct 2022- colo; Dr.Anna]- stable.   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   # Hypertension- Continue Norvasc/Metoprlol.[ per pt At home- 130/70s   # mediport:  continue port flushes q 2-63M.stable.  # Anxiety/depression/insomnia: s/p Josh Borders- recommend compliance with  cymblata- stable.  # DISPOSITION:  # MRI liver ASAP- DRI # referral to [facility]-re: pancreatic cancer/clinical trial # Follow up in 2 week- MD; labs- cbc/cmp; Ca 19-9- [facility] blood draw- Dr.B  # I reviewed the blood work- with the patient in detail; also reviewed the imaging independently [as summarized above]; and with the patient in detail.   # 40 minutes face-to-face with the patient discussing the above plan of care; more than 50% of time spent on prognosis/ natural history; counseling and coordination.

## 2023-06-22 NOTE — Progress Notes (Signed)
 [facility] CONSULT NOTE  Patient Care Team: Entzminger, Danton Clap, MD as PCP - General (Internal Medicine) Toy Cookey, FNP (Family Medicine) Jim Like, RN as Registered Nurse Scarlett Presto, RN (Inactive) as Registered Nurse Benita Gutter, RN as Oncology Nurse Navigator Almond Lint, MD as Consulting Physician (General Surgery) Earna Coder, MD as Consulting Physician (Internal Medicine)  CHIEF COMPLAINTS/PURPOSE OF CONSULTATION: pancreas adenocarcinoma  Oncology History Overview Note  #Pancreas adenocarcinoma-Stage IB- [id number]-[EUS- Dr.Spaete; [facility]/GI; mass 22x7mm; invading/abutting superior mesenteric vein; 2 enlarged lymph nodes peripancreatic/porta hepatis largest 10 x 5.8 mm-nonpathologic based on EUS criteria/no biopsy]-borderline resectable.  PET scan no evidence of distant metastatic disease.  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 Fredirick Maudlin September]  # 01/02/2020-FOLFIRINOX; Neulasta. S/p FOLFIRINOX 10 cycles- April 19th whipples-  s/p neoadjuvant FOLFIRINX. #10 cycles]-s/p Whipple's [on April 19th 2022]- ypT2 [3.5cm]; pN0/11;   DIS-CONTINUED  FOLFIRINOX cycle #11 & 12-  Sec to PN-2-3.  PANCREAS (EXOCRINE), CARCINOMA: Resection  Procedure: Whipple procedure  Tumor Site: Head of pancreas.  Tumor Size: 3.5 cm, slide measurement.  Histologic Type: Pancreatic ductal adenocarcinoma.  Histologic Grade: Moderately differentiated.  Tumor Extension: Into peripancreatic connective tissue.  Treatment Effect: Moderate treatment effect.  Lymphovascular Invasion: Not identified.  Perineural Invasion: Present.  Margins: All surgical margins are negative for carcinoma.  Regional Lymph Nodes:       Number of Lymph Nodes with Tumor: 0       Number of Lymph Nodes Examined: 15  Distant Metastasis:       Distant  Site(s) Involved: Not applicable.  Pathologic Stage Classification (pTNM, AJCC 8th Edition): ypT2, ypN0   # Borderline DM; HTN   AUG 18th, 2024- PET scan- Whipple procedure with abnormal hypermetabolism centered at the pancreaticojejunostomy, worrisome for disease recurrence. Associated pancreaticojejunostomy stent in place; Hypermetabolic adjacent retroperitoneal lymph nodes, worrisome for metastatic disease. Ca 19-9 rising.    SEP 3rd, 2024- single gem [3 weeks on 1 week off]-because of neuropathy.  # OCT 29th, 2024-CT scan abdomen pelvis-progression.  # DEC 2024-  NALIRI s/p #5- cycles-.MARCH 2025- PET scan- progression/ CT- ? Liver lesion [non- contrast]-   # MARCH 2025- acute renal failure/hypokalemia-attributed to prerenal/diarrhea- sec to chemo/ campylobacter [s/p azithromcyin]    Cancer of head of pancreas (HCC)  12/04/2019 Initial Diagnosis   Cancer of head of pancreas (HCC)   12/19/2019 - 12/19/2019 Chemotherapy   The patient had PACLitaxel-protein bound (ABRAXANE) chemo infusion 250 mg, 125 mg/m2 = 250 mg, Intravenous,  Once, 1 of 4 cycles Administration: 250 mg (12/19/2019) gemcitabine (GEMZAR) 2,000 mg in sodium chloride 0.9 % 250 mL chemo infusion, 1,976 mg, Intravenous,  Once, 1 of 4 cycles Administration: 2,000 mg (12/19/2019)  for chemotherapy treatment.    01/02/2020 - 05/15/2020 Chemotherapy   Patient is on Treatment Plan : PANCREAS Modified FOLFIRINOX q14d x 4 cycles     11/30/2022 - 02/08/2023 Chemotherapy   Patient is on Treatment Plan : PANCREAS Gemcitabine D1,8, (1000) q21d     03/14/2023 -  Chemotherapy   Patient is on Treatment Plan : PANCREAS Liposomal Irinotecan + Leucovorin + 5-FU IVCI q14d      HISTORY OF PRESENTING ILLNESS: Patient ambulating-independently. With daughter.   Robin Daniels 68 y.o.  female history recurrent stage IV  pancreatic adeno ca -currently on NALIRI chemotherapy is here for follow-up/ review PET scan; and CT scan-.  Patient was  recently admitted to hospital for acute renal failure/hypokalemia-attributed to prerenal/diarrhea.  Diarrhea better/resolved; appetite improving; no nausea or vomiting.  Denies any fevers or chills.  Abdominal pain/back pain improved.    Patient continues to have tingling and numbness in the extremities- currently on gabapentin not significantly better or worse.   No fever no chills. Weight is stable.   Review of Systems  Constitutional:  Negative for chills, diaphoresis, fever and malaise/fatigue.  HENT:  Negative for nosebleeds and sore throat.   Eyes:  Negative for double vision.  Respiratory:  Negative for cough, hemoptysis, sputum production, shortness of breath and wheezing.   Cardiovascular:  Negative for chest pain, palpitations, orthopnea and leg swelling.  Gastrointestinal:  Negative for abdominal pain, blood in stool, constipation, diarrhea, heartburn, melena, nausea and vomiting.  Genitourinary:  Negative for dysuria, frequency and urgency.  Musculoskeletal:  Negative for back pain and joint pain.  Skin: Negative.  Negative for itching and rash.  Neurological:  Positive for tingling. Negative for dizziness, focal weakness, weakness and headaches.  Endo/Heme/Allergies:  Does not bruise/bleed easily.  Psychiatric/Behavioral:  Negative for depression. The patient is not nervous/anxious and does not have insomnia.      MEDICAL HISTORY:  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/04/2019   Complication of anesthesia    Fluttering heart    Hyperlipidemia    Hypertension    Palliative care encounter 06/13/2023   PONV (postoperative nausea and vomiting)    Pre-diabetes    Vertigo    none recently    SURGICAL HISTORY: Past Surgical History:  Procedure Laterality Date   CATARACT EXTRACTION W/PHACO Right 06/09/2021   Procedure: CATARACT EXTRACTION PHACO AND INTRAOCULAR LENS PLACEMENT  (IOC) RIGHT malyugin;  Surgeon: Galen Manila, MD;  Location: [facility];  Service: Ophthalmology;  Laterality: Right;  12.40 1:07.0   COLONOSCOPY WITH PROPOFOL N/A 01/13/2021   Procedure: COLONOSCOPY WITH PROPOFOL;  Surgeon: Wyline Mood, MD;  Location: [facility];  Service: Gastroenterology;  Laterality: N/A;   ENDOSCOPIC RETROGRADE CHOLANGIOPANCREATOGRAPHY (ERCP) WITH PROPOFOL N/A 11/16/2019   Procedure: ENDOSCOPIC RETROGRADE CHOLANGIOPANCREATOGRAPHY (ERCP) WITH PROPOFOL;  Surgeon: Midge Minium, MD;  Location: [facility] ENDOSCOPY;  Service: Endoscopy;  Laterality: N/A;   ERCP N/A 03/27/2020   Procedure: ENDOSCOPIC RETROGRADE CHOLANGIOPANCREATOGRAPHY (ERCP);  Surgeon: Midge Minium, MD;  Location: [facility] ENDOSCOPY;  Service: Endoscopy;  Laterality: N/A;   EUS N/A 11/29/2019   Procedure: FULL UPPER ENDOSCOPIC ULTRASOUND (EUS) RADIAL;  Surgeon: Doren Custard, MD;  Location: [facility] ENDOSCOPY;  Service: Gastroenterology;  Laterality: N/A;   IR FLUORO RM 30-60 MIN  07/30/2020   IR IMAGING GUIDED PORT INSERTION  12/14/2019   JEJUNOSTOMY Left 07/15/2020   Procedure: Rance Muir;  Surgeon: Almond Lint, MD;  Location: [facility] OR;  Service: General;  Laterality: Left;   LAPAROSCOPY N/A 07/15/2020   Procedure: DIAGNOSTIC LAPAROSCOPY;  Surgeon: Almond Lint, MD;  Location: [facility] OR;  Service: General;  Laterality: N/A;   right knee replacement Right [id number]   SHOULDER ARTHROSCOPY W/ ROTATOR CUFF REPAIR Right    WHIPPLE PROCEDURE N/A 07/15/2020   Procedure: WHIPPLE PROCEDURE;  Surgeon: Almond Lint, MD;  Location: [facility] OR;  Service: General;  Laterality: N/A;    SOCIAL HISTORY: Social History   Socioeconomic History   Marital status: Divorced    Spouse name: Not on file   Number of children: Not on  file   Years of education: Not on file   Highest education level: Not on file  Occupational History   Not on file  Tobacco Use   Smoking status: Never   Smokeless tobacco: Never  Vaping Use   Vaping  status: Never Used  Substance and Sexual Activity   Alcohol use: No    Alcohol/week: 0.0 standard drinks of alcohol   Drug use: No   Sexual activity: Never  Other Topics Concern   Not on file  Social History Narrative   ** Merged History Encounter **       Lives in [place]; with mom and son; worked in dietary; never smoked; no alcohol.    Social Drivers of Corporate investment banker Strain: Low Risk  (12/01/2022)   Overall Financial Resource Strain (CARDIA)    Difficulty of Paying Living Expenses: Not hard at all  Food Insecurity: No Food Insecurity (06/10/2023)   Hunger Vital Sign    Worried About Running Out of Food in the Last Year: Never true    Ran Out of Food in the Last Year: Never true  Transportation Needs: No Transportation Needs (06/10/2023)   PRAPARE - Administrator, Civil Service (Medical): No    Lack of Transportation (Non-Medical): No  Physical Activity: Not on file  Stress: Not on file  Social Connections: Moderately Isolated (06/10/2023)   Social Connection and Isolation Panel [NHANES]    Frequency of Communication with Friends and Family: More than three times a week    Frequency of Social Gatherings with Friends and Family: More than three times a week    Attends Religious Services: More than 4 times per year    Active Member of [employer] or Organizations: No    Attends Banker Meetings: Never    Marital Status: Divorced  Catering manager Violence: Not At Risk (06/10/2023)   Humiliation, Afraid, Rape, and Kick questionnaire    Fear of Current or Ex-Partner: No    Emotionally Abused: No    Physically Abused: No    Sexually Abused: No    FAMILY HISTORY: Family History  Problem Relation Age of Onset   Diabetes Mother    Hyperlipidemia Mother    Hypertension Mother    Vision loss Mother    Arthritis Father    Hyperlipidemia Father    Hypertension Father    Breast cancer Maternal Aunt     ALLERGIES:  has no known  allergies.  MEDICATIONS:  Current Outpatient Medications  Medication Sig Dispense Refill   acetaminophen (TYLENOL) 325 MG tablet Take 650 mg by mouth every 6 (six) hours as needed for moderate pain.     diphenoxylate-atropine (LOMOTIL) 2.5-0.025 MG tablet Take 1 tablet by mouth 4 (four) times daily as needed for diarrhea or loose stools. Take it along with immodium 60 tablet 0   feeding supplement (ENSURE ENLIVE / ENSURE PLUS) LIQD Take 237 mLs by mouth 2 (two) times daily between meals.     Ferrous Bisglycinate Chelate 28 MG CAPS Take 1 tablet by mouth daily.     gabapentin (NEURONTIN) 600 MG tablet Take 1 tablet (600 mg total) by mouth daily as needed. Home med.     lidocaine-prilocaine (EMLA) cream Apply on the port. 30 -45 min  prior to port access. 30 g 3   magic mouthwash (multi-ingredient) oral suspension Swish and swallow 5-10 mLs by mouth 4 (four) times daily as needed. 480 mL 3   [Paused] metoprolol succinate (TOPROL-XL) 50  MG 24 hr tablet Take 1 tablet (50 mg total) by mouth daily. Take with or immediately following a meal. 30 tablet 0   OLANZapine (ZYPREXA) 5 MG tablet Take 1 tablet (5 mg total) by mouth at bedtime. 30 tablet 2   ondansetron (ZOFRAN) 8 MG tablet One pill every 8 hours as needed for nausea/vomitting. 40 tablet 1   potassium chloride (KLOR-CON) 20 MEQ packet Take 40 mEq by mouth 2 (two) times daily for 7 days. Mix packet as directed. 28 packet 0   prochlorperazine (COMPAZINE) 10 MG tablet Take 1 tablet (10 mg total) by mouth every 6 (six) hours as needed for nausea or vomiting. 40 tablet 1   sodium bicarbonate 325 MG tablet Take 2 tablets (650 mg total) by mouth 3 (three) times daily. 180 tablet 0   spironolactone (ALDACTONE) 25 MG tablet Take 1 tablet (25 mg total) by mouth 2 (two) times daily. 60 tablet 0   traMADol (ULTRAM) 50 MG tablet Take 1 tablet (50 mg total) by mouth every 8 (eight) hours as needed. 90 tablet 0   vitamin B-12 (CYANOCOBALAMIN) 1000 MCG tablet  Take 1 tablet (1,000 mcg total) by mouth daily.     No current facility-administered medications for this visit.   Facility-Administered Medications Ordered in Other Visits  Medication Dose Route Frequency Provider Last Rate Last Admin   sodium chloride flush (NS) 0.9 % injection 10 mL  10 mL Intravenous PRN Louretta Shorten R, MD   10 mL at 06/23/21 1012   sodium chloride flush (NS) 0.9 % injection 10 mL  10 mL Intracatheter PRN Earna Coder, MD   10 mL at 03/16/23 1311      PHYSICAL EXAMINATION: ECOG PERFORMANCE STATUS: 0 - Asymptomatic  Vitals:   06/22/23 0903  BP: (!) 136/90  Pulse: (!) 121  Resp: 18  Temp: (!) 97.4 F (36.3 C)  SpO2: 100%        Filed Weights   06/22/23 0903  Weight: 149 lb (67.6 kg)       Physical Exam Vitals reviewed.  Constitutional:      Appearance: She is not ill-appearing.  HENT:     Head: Normocephalic and atraumatic.     Mouth/Throat:     Pharynx: No oropharyngeal exudate.  Eyes:     General: No scleral icterus. Cardiovascular:     Rate and Rhythm: Normal rate and regular rhythm.  Pulmonary:     Effort: Pulmonary effort is normal. No respiratory distress.     Breath sounds: No wheezing.  Abdominal:     General: There is no distension.     Palpations: Abdomen is soft.     Tenderness: There is no abdominal tenderness. There is no guarding.  Musculoskeletal:        General: No tenderness or deformity.  Lymphadenopathy:     Cervical: No cervical adenopathy.  Skin:    General: Skin is warm.     Coloration: Skin is not pale.  Neurological:     Mental Status: She is alert and oriented to person, place, and time.  Psychiatric:        Mood and Affect: Mood and affect normal.        Behavior: Behavior normal.    LABORATORY DATA:  I have reviewed the data as listed Lab Results  Component Value Date   WBC 7.8 06/22/2023   HGB 8.6 (L) 06/22/2023   HCT 25.5 (L) 06/22/2023   MCV 78.2 (L) 06/22/2023   PLT 238  06/22/2023   Recent Labs    06/10/23 0849 06/10/23 1509 06/11/23 0450 06/11/23 1654 06/11/23 2004 06/12/23 0258 06/16/23 0443 06/16/23 1700 06/17/23 0519 06/22/23 0851  NA 126*   < > 127*   < >  --    < > 137  --  135 130*  K <2.0*   < > 2.2*   < > 2.4*   < > 3.2* 3.4* 3.8 5.0  CL 97*   < > 99   < >  --    < > 109  --  110 102  CO2 10*   < > 17*   < >  --    < > 22  --  18* 18*  GLUCOSE 183*   < > 146*   < >  --    < > 105*  --  104* 119*  BUN 78*   < > 67*   < >  --    < > 15  --  9 9  CREATININE 5.01*   < > 3.71*   < >  --    < > 1.12*  --  1.00 1.08*  CALCIUM 8.5*   < > 7.6*   < > 7.8*   < > 8.0*  --  8.2* 8.6*  GFRNONAA 9*   < > 13*   < >  --    < > 53*  --  >60 55*  PROT 7.6  --  6.0*  --   --   --   --   --   --  6.5  ALBUMIN 3.3*  --  2.7*  --  2.7*  --   --   --   --  2.8*  AST 20  --  21  --   --   --   --   --   --  16  ALT 32  --  22  --   --   --   --   --   --  15  ALKPHOS 127*  --  93  --   --   --   --   --   --  108  BILITOT 0.6  --  0.6  --   --   --   --   --   --  0.8   < > = values in this interval not displayed.   Component Ref Range & Units 1 mo ago (06/25/22) 4 mo ago (03/26/22) 6 mo ago (01/22/22) 8 mo ago (11/23/21) 1 yr ago (06/23/21) 1 yr ago (04/20/21) 1 yr ago (01/23/21)  CA 19-9 0 - 35 U/mL 24 21 CM 17 CM 12 CM 14 CM 13 CM 16 CM     RADIOGRAPHIC STUDIES: I have personally reviewed the radiological images as listed and agreed with the findings in the report. CT ABDOMEN PELVIS WO CONTRAST Result Date: 06/10/2023 CLINICAL DATA:  68 year old female with a history of abdominal pain EXAM: CT ABDOMEN AND PELVIS WITHOUT CONTRAST TECHNIQUE: Multidetector CT imaging of the abdomen and pelvis was performed following the standard protocol without IV contrast. RADIATION DOSE REDUCTION: This exam was performed according to the departmental dose-optimization program which includes automated exposure control, adjustment of the mA and/or kV according to  patient size and/or use of iterative reconstruction technique. COMPARISON:  CT 01/25/2023, PET-CT 05/24/2023 FINDINGS: Lower chest: Mild centrilobular nodularity within the medial right lower lobe Hepatobiliary: Pneumobilia. Focal 14 mm hypodensity the lower liver, poorly characterized on the  noncontrast CT though appears new from the prior PET. Redemonstration cholecystectomy, pancreatico duodenectomy. Unchanged position of the stent at the biliary anastomosis. The surgical site is poorly characterized with the absence of contrast. Pancreas: Surgical changes of pancreatico duodenectomy, stent at the anastomosis. Surgical site is poorly characterized with the absence of contrast. Spleen: Unremarkable Adrenals/Urinary Tract: - Right adrenal gland:  Unremarkable - Left adrenal gland: Unremarkable. - Right kidney: No hydronephrosis, nephrolithiasis, inflammation, or ureteral dilation. - Left Kidney: No hydronephrosis, nephrolithiasis, inflammation, or ureteral dilation. - Urinary Bladder: Unremarkable. Stomach/Bowel: Redemonstration surgical changes at the stomach, surgical changes of pancreatico duodenectomy. Relatively unremarkable small bowel. The length of the colon is fluid-filled, without wall thickening or significant focal inflammatory changes. No evidence of obstruction as the fluid extends through the rectum. Vascular/Lymphatic: Atherosclerotic changes of the abdominal aorta, mesenteric arteries, renal arteries, iliac arteries. Small mesenteric lymph nodes nonspecific. Reproductive: Unremarkable uterus/adnexa Other: Periumbilical fat. Musculoskeletal: No acute displaced fracture. Degenerative changes of the thoracolumbar spine. IMPRESSION: The length of colon is fluid-filled, nonspecific finding though potentially representing enteritis and/or colitis. No evidence of obstruction, and no focal inflammation. Redemonstration of surgical changes of pancreatico duodenectomy. The surgical site is poorly  characterized with the absence of contrast. Pattern of centrilobular nodularity in the medial right lower lobe compatible with infectious/inflammatory change. **An incidental finding of potential clinical significance has been found. Focal hypodensity of the right liver measuring 14 mm. A focal liver lesion cannot be excluded and in the setting of the patient's known prior pancreatic cancer, follow-up with oncology is recommended to determine further imaging strategy such as contrast MRI or CT.** Aortic Atherosclerosis (ICD10-I70.0). Additional ancillary findings as above Electronically Signed   By: Gilmer Mor D.O.   On: 06/10/2023 15:57   DG Chest 2 View Result Date: 06/10/2023 CLINICAL DATA:  Lethargy, failure to thrive. EXAM: CHEST - 2 VIEW COMPARISON:  July 15, 2020.  May 24, 2023. FINDINGS: The heart size and mediastinal contours are within normal limits. 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 No definite consolidative process is noted. Right internal jugular Port-A-Cath is unchanged. The visualized skeletal structures are unremarkable. IMPRESSION: No active cardiopulmonary disease. Electronically Signed   By: Lupita Raider M.D.   On: 06/10/2023 11:03   NM PET Image Restage (PS) Skull Base to Thigh (F-18 FDG) Result Date: 06/08/2023 CLINICAL DATA:  Subsequent treatment strategy for pancreatic cancer. EXAM: NUCLEAR MEDICINE PET SKULL BASE TO THIGH TECHNIQUE: 7.83 mCi F-18 FDG was injected intravenously. Full-ring PET imaging was performed from the skull base to thigh after the radiotracer. CT data was obtained and used for attenuation correction and anatomic localization. Fasting blood glucose: 100 mg/dl COMPARISON:  Prior PET CTs 02/25/2023 and 11/09/2022. FINDINGS: Mediastinal blood pool activity: SUV max 2.99 Liver activity: SUV max NA NECK: No hypermetabolic lymph nodes in the neck. There is a small focus of hypermetabolism in the  left thyroid lobe. This corresponds to an 8 mm nodule on the prior CT scan. Recommend thyroid US (ref: J Am Coll Radiol. 2015 Feb;12(2): 143-50). Incidental CT findings: Bilateral carotid artery calcifications. CHEST: No hypermetabolic mediastinal or hilar nodes. No suspicious pulmonary nodules on the CT scan. No hypermetabolic breast masses, supraclavicular or axillary adenopathy. Incidental CT findings: Stable atherosclerotic calcifications involving the aorta and coronary arteries. 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 ABDOMEN/PELVIS: Persistent focus of hypermetabolism noted near the anastomosis and surrounding the vascular clips or sutures posterior to the  pancreatic duct drain. SUV max is 8.98. This area was smaller and had an SUV max of 8.36 on the prior PET-CT. Second hypermetabolic focus just to the left of the celiac axis has an SUV max of 4.24. This area was much larger on the prior study and had an SUV max of 7.30. On the prior study there was a third focus of hypermetabolism chest to the left of the SMA which corresponded to a small lymph node. Do not see any measurable lymph node or any residual hypermetabolism on today study. Persistent hypermetabolism associated with the antral region of the stomach possibly related to reflux from the Whipple procedure. No new hypermetabolic foci to suggest hepatic metastatic disease. No omental or peritoneal implants or new areas of abdominal or pelvic adenopathy. Incidental CT findings: Stable surgical changes. The pancreatic duct stent appears stable. Minimal scattered atherosclerotic calcifications. Stable periumbilical abdominal wall hernia containing fat. No free fluid. SKELETON: No findings suspicious for osseous metastatic disease. Incidental CT findings: None. IMPRESSION: 1. Persistent focus of hypermetabolism (8.98 versus previous 8.36) near the anastomosis and surrounding the vascular clips or sutures posterior to the pancreatic duct drain. This  area appears larger. 2. Second hypermetabolic focus just to the left of the celiac axis has an SUV max of 4.24. This area was much larger on the prior study and had an SUV max of 7.30. 3. On the prior study there was a third focus of hypermetabolism just to the left of the SMA which corresponded to a small lymph node. I do not see any measurable lymph node or any residual hypermetabolism on today study. 4. No findings to suggest hepatic metastatic disease. 5. No findings for metastatic disease involving the chest or pelvis. 6. Small focus of hypermetabolism in the left thyroid lobe. This corresponds to an 8 mm nodule on the prior CT scan. Recommend thyroid US. 7. Aortic atherosclerosis. Electronically Signed   By: Rudie Meyer M.D.   On: 06/08/2023 17:19     ASSESSMENT & PLAN:   Cancer of head of pancreas (HCC) # AUG 2024- STAGE IV/METASTATIC- Recurrent [No biopsy] Pancreas adenocarcinoma-   NGS-tempus-No targets noted however MSI-QNS.     #  OCT 29th, 2024- CT CAP/ PET scan: progression.   Intense metabolic activity at the junction of the pancreas and duodenum not changed from prior. New hypermetabolic tissue position between the pancreas in the SMA is concerning for nodal metastasis;  New smaller focus hypermotility just RIGHT of the takeoff of the SMA is also concerning for nodal metastasis; . overall concern for progression of local peripancreatic nodal metastasis.  Patient currently s/p  NALIRI chemotherapy #5 cycles-   # MARCH 2025-  PET  Persistent focus of hypermetabolism (8.98 versus previous 8.36) near the anastomosis and surrounding the vascular clips or sutures posterior to the pancreatic duct drain. This area appears larger.  Second hypermetabolic focus just to the left of the celiac axis has an SUV max of 4.24. This area was much larger on the prior study and had an SUV max of 7.30.  On the prior study there was a third focus of hypermetabolism just to the left of the SMA which  corresponded to a small lymph node. I do not see any measurable lymph node or any residual hypermetabolism on today study. No findings to suggest hepatic metastatic disease  # However noncontrast CT scan done in the hospital- Focal hypodensity of the right liver measuring 14 mm. Patients is recommended   further imaging  strategy such as contrast MRI of the liver for further evaluation.  # However given the rising cancer marker and the lack of significant response noted on the PET scan-patient likely has progression of disease/based on imaging.  Also given poor tolerance/lack of response to current chemotherapy with NALIRI-I recommend discontinuation of current therapy. # referral to [facility]-re: pancreatic cancer/clinical trial  # MARCH 2025- acute renal failure/hypokalemia-attributed to prerenal/diarrhea- sec to chemo/ campylobacter [s/p azithromcyin]- checking UGTA-1 gennotype today.   # Severe Hypokalemia- Potassium  3.4-  improved today- 5-stop potassium supplementation.  Will recheck again in 2 weeks.  # Tachycardia-120s; off metoprolol-restart metoprolol.   # back pain-  secondary to progressive disease:improved- currently on tramadol prn.  stable.    # Anemia- microcytic- Stable 9-10; NO IDA- ;  colo [EUS- 2021; oct 2022- colo; Dr.Anna]- stable.   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   # Hypertension- Continue Norvasc/Metoprlol.[ per pt At home- 130/70s   # mediport:  continue port flushes q 2-75M.stable.  # Anxiety/depression/insomnia: s/p Josh Borders- recommend compliance with  cymblata- stable.  # DISPOSITION:  # MRI liver ASAP- DRI # referral to [facility]-re: pancreatic cancer/clinical trial # Follow up in 2 week- MD; labs- cbc/cmp; Ca 19-9- [facility] blood draw- Dr.B  # I reviewed the blood work- with the patient in detail; also reviewed the imaging independently [as summarized above]; and with  the patient in detail.   # 40 minutes face-to-face with the patient discussing the above plan of care; more than 50% of time spent on prognosis/ natural history; counseling and coordination.   Earna Coder, MD 06/22/2023

## 2023-06-22 NOTE — Patient Instructions (Signed)
#   Stop potassium supplementation.  Will recheck again in 2 weeks.  # Restart metoprolol.

## 2023-06-23 ENCOUNTER — Encounter: Payer: Self-pay | Admitting: Internal Medicine

## 2023-06-23 ENCOUNTER — Ambulatory Visit
Admission: RE | Admit: 2023-06-23 | Discharge: 2023-06-23 | Disposition: A | Discharge status: Home / Self Care | Source: Ambulatory Visit | Attending: Internal Medicine | Admitting: Internal Medicine

## 2023-06-23 DIAGNOSIS — C250 Malignant neoplasm of head of pancreas: Secondary | ICD-10-CM

## 2023-06-23 DIAGNOSIS — K769 Liver disease, unspecified: Secondary | ICD-10-CM

## 2023-06-23 LAB — CANCER ANTIGEN 19-9: CA 19-9: 1819 U/mL — ABNORMAL HIGH (ref 0–35)

## 2023-06-23 MED ORDER — HEPARIN SOD (PORK) LOCK FLUSH 100 UNIT/ML IV SOLN
500.0000 [IU] | Freq: Once | INTRAVENOUS | Status: AC
  Administered 2023-06-23: 500 [IU] via INTRAVENOUS

## 2023-06-23 MED ORDER — GADOPICLENOL 0.5 MMOL/ML IV SOLN
7.5000 mL | Freq: Once | INTRAVENOUS | Status: AC | PRN
  Administered 2023-06-23: 7 mL via INTRAVENOUS

## 2023-06-23 MED ORDER — SODIUM CHLORIDE 0.9% FLUSH
10.0000 mL | INTRAVENOUS | Status: DC | PRN
  Administered 2023-06-23: 10 mL via INTRAVENOUS

## 2023-07-04 ENCOUNTER — Telehealth: Payer: Self-pay | Admitting: Internal Medicine

## 2023-07-04 ENCOUNTER — Telehealth: Payer: Self-pay | Admitting: *Deleted

## 2023-07-04 NOTE — Telephone Encounter (Signed)
 I  spoke to Dr.Blobe- Kristie- Dr.Blobe reocmmends- Archer[facility] FUSIONPlexT Pan Solid Tumor v2 panel-off the pan ca tissue from whipple's.- please check re: ordering this test-  Keep appts as planned- otherwise-    GB

## 2023-07-04 NOTE — Telephone Encounter (Signed)
 I sent a secure chat to [facility] and the telephone  of Dr. Johnnette Gourd [phone]. The tempus was not sufficient specimen to use.

## 2023-07-05 LAB — MISCELLANEOUS TEST

## 2023-07-11 ENCOUNTER — Inpatient Hospital Stay: Attending: Internal Medicine

## 2023-07-11 ENCOUNTER — Inpatient Hospital Stay (HOSPITAL_BASED_OUTPATIENT_CLINIC_OR_DEPARTMENT_OTHER): Admitting: Internal Medicine

## 2023-07-11 ENCOUNTER — Encounter: Payer: Self-pay | Admitting: Internal Medicine

## 2023-07-11 VITALS — BP 125/85 | HR 111 | Temp 97.1°F | Resp 18 | Ht 64.0 in | Wt 137.2 lb

## 2023-07-11 DIAGNOSIS — R000 Tachycardia, unspecified: Secondary | ICD-10-CM | POA: Insufficient documentation

## 2023-07-11 DIAGNOSIS — C250 Malignant neoplasm of head of pancreas: Secondary | ICD-10-CM | POA: Diagnosis not present

## 2023-07-11 DIAGNOSIS — Z79899 Other long term (current) drug therapy: Secondary | ICD-10-CM | POA: Diagnosis not present

## 2023-07-11 DIAGNOSIS — G4700 Insomnia, unspecified: Secondary | ICD-10-CM | POA: Insufficient documentation

## 2023-07-11 DIAGNOSIS — D509 Iron deficiency anemia, unspecified: Secondary | ICD-10-CM | POA: Diagnosis not present

## 2023-07-11 DIAGNOSIS — F419 Anxiety disorder, unspecified: Secondary | ICD-10-CM | POA: Insufficient documentation

## 2023-07-11 DIAGNOSIS — E876 Hypokalemia: Secondary | ICD-10-CM | POA: Insufficient documentation

## 2023-07-11 DIAGNOSIS — I10 Essential (primary) hypertension: Secondary | ICD-10-CM | POA: Diagnosis not present

## 2023-07-11 DIAGNOSIS — F32A Depression, unspecified: Secondary | ICD-10-CM | POA: Insufficient documentation

## 2023-07-11 LAB — CBC WITH DIFFERENTIAL (CANCER CENTER ONLY)
Abs Immature Granulocytes: 0.02 10*3/uL (ref 0.00–0.07)
Basophils Absolute: 0 10*3/uL (ref 0.0–0.1)
Basophils Relative: 0 %
Eosinophils Absolute: 0 10*3/uL (ref 0.0–0.5)
Eosinophils Relative: 1 %
HCT: 25.3 % — ABNORMAL LOW (ref 36.0–46.0)
Hemoglobin: 8.1 g/dL — ABNORMAL LOW (ref 12.0–15.0)
Immature Granulocytes: 0 %
Lymphocytes Relative: 21 %
Lymphs Abs: 1.3 10*3/uL (ref 0.7–4.0)
MCH: 26 pg (ref 26.0–34.0)
MCHC: 32 g/dL (ref 30.0–36.0)
MCV: 81.1 fL (ref 80.0–100.0)
Monocytes Absolute: 0.4 10*3/uL (ref 0.1–1.0)
Monocytes Relative: 7 %
Neutro Abs: 4.4 10*3/uL (ref 1.7–7.7)
Neutrophils Relative %: 71 %
Platelet Count: 173 10*3/uL (ref 150–400)
RBC: 3.12 MIL/uL — ABNORMAL LOW (ref 3.87–5.11)
RDW: 17 % — ABNORMAL HIGH (ref 11.5–15.5)
WBC Count: 6.3 10*3/uL (ref 4.0–10.5)
nRBC: 0 % (ref 0.0–0.2)

## 2023-07-11 LAB — CMP (CANCER CENTER ONLY)
ALT: 15 U/L (ref 0–44)
AST: 25 U/L (ref 15–41)
Albumin: 3 g/dL — ABNORMAL LOW (ref 3.5–5.0)
Alkaline Phosphatase: 87 U/L (ref 38–126)
Anion gap: 9 (ref 5–15)
BUN: 10 mg/dL (ref 8–23)
CO2: 24 mmol/L (ref 22–32)
Calcium: 8.5 mg/dL — ABNORMAL LOW (ref 8.9–10.3)
Chloride: 100 mmol/L (ref 98–111)
Creatinine: 0.95 mg/dL (ref 0.44–1.00)
GFR, Estimated: >60 mL/min (ref >60)
Glucose, Bld: 140 mg/dL — ABNORMAL HIGH (ref 70–99)
Potassium: 3.5 mmol/L (ref 3.5–5.1)
Sodium: 133 mmol/L — ABNORMAL LOW (ref 135–145)
Total Bilirubin: 0.8 mg/dL (ref 0.0–1.2)
Total Protein: 6.5 g/dL (ref 6.5–8.1)

## 2023-07-11 NOTE — Progress Notes (Unsigned)
  Cancer Center CONSULT NOTE  Patient Care Team: Entzminger, Danton Clap, MD as PCP - General (Internal Medicine) Toy Cookey, FNP (Family Medicine) Jim Like, RN as Registered Nurse Scarlett Presto, RN (Inactive) as Registered Nurse Benita Gutter, RN as Oncology Nurse Navigator Almond Lint, MD as Consulting Physician (General Surgery) Earna Coder, MD as Consulting Physician (Internal Medicine)  CHIEF COMPLAINTS/PURPOSE OF CONSULTATION: pancreas adenocarcinoma  Oncology History Overview Note  #Pancreas adenocarcinoma-Stage IB- [id number]-[EUS- Dr.Spaete; [facility]; mass 22x34mm; invading/abutting superior mesenteric vein; 2 enlarged lymph nodes peripancreatic/porta hepatis largest 10 x 5.8 mm-nonpathologic based on EUS criteria/no biopsy]-borderline resectable.  PET scan no evidence of distant metastatic disease.  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 Fredirick Maudlin September]  # 01/02/2020-FOLFIRINOX; Neulasta. S/p FOLFIRINOX 10 cycles- April 19th whipples-  s/p neoadjuvant FOLFIRINX. #10 cycles]-s/p Whipple's [on April 19th 2022]- ypT2 [3.5cm]; pN0/11;   DIS-CONTINUED  FOLFIRINOX cycle #11 & 12-  Sec to PN-2-3.  PANCREAS (EXOCRINE), CARCINOMA: Resection  Procedure: Whipple procedure  Tumor Site: Head of pancreas.  Tumor Size: 3.5 cm, slide measurement.  Histologic Type: Pancreatic ductal adenocarcinoma.  Histologic Grade: Moderately differentiated.  Tumor Extension: Into peripancreatic connective tissue.  Treatment Effect: Moderate treatment effect.  Lymphovascular Invasion: Not identified.  Perineural Invasion: Present.  Margins: All surgical margins are negative for carcinoma.  Regional Lymph Nodes:       Number of Lymph Nodes with Tumor: 0       Number of Lymph Nodes Examined: 15  Distant Metastasis:       Distant  Site(s) Involved: Not applicable.  Pathologic Stage Classification (pTNM, AJCC 8th Edition): ypT2, ypN0   # Borderline DM; HTN   AUG 18th, 2024- PET scan- Whipple procedure with abnormal hypermetabolism centered at the pancreaticojejunostomy, worrisome for disease recurrence. Associated pancreaticojejunostomy stent in place; Hypermetabolic adjacent retroperitoneal lymph nodes, worrisome for metastatic disease. Ca 19-9 rising.    SEP 3rd, 2024- single gem [3 weeks on 1 week off]-because of neuropathy.  # OCT 29th, 2024-CT scan abdomen pelvis-progression.  # DEC 2024-  NALIRI s/p #5- cycles-.MARCH 2025- PET scan- progression/ CT- ? Liver lesion [non- contrast]- MRI- limited study- NEG for liver lesion.   # MARCH 2025- acute renal failure/hypokalemia-attributed to prerenal/diarrhea- sec to chemo/ campylobacter [s/p azithromcyin]  # APRIL 2nd opinion at [facility]- Dr. Johnnette Gourd    Cancer of head of pancreas (HCC)  12/04/2019 Initial Diagnosis   Cancer of head of pancreas (HCC)   12/19/2019 - 12/19/2019 Chemotherapy   The patient had PACLitaxel-protein bound (ABRAXANE) chemo infusion 250 mg, 125 mg/m2 = 250 mg, Intravenous,  Once, 1 of 4 cycles Administration: 250 mg (12/19/2019) gemcitabine (GEMZAR) 2,000 mg in sodium chloride 0.9 % 250 mL chemo infusion, 1,976 mg, Intravenous,  Once, 1 of 4 cycles Administration: 2,000 mg (12/19/2019)  for chemotherapy treatment.    01/02/2020 - 05/15/2020 Chemotherapy   Patient is on Treatment Plan : PANCREAS Modified FOLFIRINOX q14d x 4 cycles     11/30/2022 - 02/08/2023 Chemotherapy   Patient is on Treatment Plan : PANCREAS Gemcitabine D1,8, (1000) q21d     03/14/2023 - 05/27/2023 Chemotherapy   Patient is on Treatment Plan : PANCREAS Liposomal Irinotecan + Leucovorin + 5-FU IVCI q14d      HISTORY OF PRESENTING ILLNESS: Patient ambulating-independently. Alone.   [name] 68 y.o.  female history recurrent stage IV  pancreatic adeno ca  s/p chemotherapy -   NALIRI  is here for a follow up.  In the interim patient was evaluated at [facility] for second opinion.  Otherwise no hospitalizations.  Diarrhea better/resolved; appetite improving; no nausea or vomiting.  Denies any fevers or chills.  Abdominal pain/back pain improved.    Patient continues to have tingling and numbness in the extremities- currently on gabapentin not significantly better or worse.   No fever no chills. Weight is stable.   Review of Systems  Constitutional:  Negative for chills, diaphoresis, fever and malaise/fatigue.  HENT:  Negative for nosebleeds and sore throat.   Eyes:  Negative for double vision.  Respiratory:  Negative for cough, hemoptysis, sputum production, shortness of breath and wheezing.   Cardiovascular:  Negative for chest pain, palpitations, orthopnea and leg swelling.  Gastrointestinal:  Negative for abdominal pain, blood in stool, constipation, diarrhea, heartburn, melena, nausea and vomiting.  Genitourinary:  Negative for dysuria, frequency and urgency.  Musculoskeletal:  Negative for back pain and joint pain.  Skin: Negative.  Negative for itching and rash.  Neurological:  Positive for tingling. Negative for dizziness, focal weakness, weakness and headaches.  Endo/Heme/Allergies:  Does not bruise/bleed easily.  Psychiatric/Behavioral:  Negative for depression. The patient is not nervous/anxious and does not have insomnia.      MEDICAL HISTORY:  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/04/2019   Complication of anesthesia    Fluttering heart    Hyperlipidemia    Hypertension    Palliative care encounter 06/13/2023   PONV (postoperative nausea and vomiting)    Pre-diabetes    Vertigo    none recently    SURGICAL HISTORY: Past Surgical History:  Procedure Laterality Date   CATARACT EXTRACTION W/PHACO Right 06/09/2021   Procedure: CATARACT EXTRACTION  PHACO AND INTRAOCULAR LENS PLACEMENT (IOC) RIGHT malyugin;  Surgeon: Clair Crews, MD;  Location: [facility];  Service: Ophthalmology;  Laterality: Right;  12.40 1:07.0   COLONOSCOPY WITH PROPOFOL N/A 01/13/2021   Procedure: COLONOSCOPY WITH PROPOFOL;  Surgeon: Luke Salaam, MD;  Location: [facility];  Service: Gastroenterology;  Laterality: N/A;   ENDOSCOPIC RETROGRADE CHOLANGIOPANCREATOGRAPHY (ERCP) WITH PROPOFOL N/A 11/16/2019   Procedure: ENDOSCOPIC RETROGRADE CHOLANGIOPANCREATOGRAPHY (ERCP) WITH PROPOFOL;  Surgeon: Marnee Sink, MD;  Location: [facility] ENDOSCOPY;  Service: Endoscopy;  Laterality: N/A;   ERCP N/A 03/27/2020   Procedure: ENDOSCOPIC RETROGRADE CHOLANGIOPANCREATOGRAPHY (ERCP);  Surgeon: Marnee Sink, MD;  Location: [facility];  Service: Endoscopy;  Laterality: N/A;   EUS N/A 11/29/2019   Procedure: FULL UPPER ENDOSCOPIC ULTRASOUND (EUS) RADIAL;  Surgeon: Rayford Cake, MD;  Location: [facility] ENDOSCOPY;  Service: Gastroenterology;  Laterality: N/A;   IR FLUORO RM 30-60 MIN  07/30/2020   IR IMAGING GUIDED PORT INSERTION  12/14/2019   JEJUNOSTOMY Left 07/15/2020   Procedure: Tonette Franco;  Surgeon: Lockie Rima, MD;  Location: [facility] OR;  Service: General;  Laterality: Left;   LAPAROSCOPY N/A 07/15/2020   Procedure: DIAGNOSTIC LAPAROSCOPY;  Surgeon: Lockie Rima, MD;  Location: [facility] OR;  Service: General;  Laterality: N/A;   right knee replacement Right [id number]   SHOULDER ARTHROSCOPY W/ ROTATOR CUFF REPAIR Right    WHIPPLE PROCEDURE N/A 07/15/2020   Procedure: WHIPPLE PROCEDURE;  Surgeon: Lockie Rima, MD;  Location: [facility] OR;  Service: General;  Laterality: N/A;    SOCIAL HISTORY: Social History   Socioeconomic History   Marital status:  Divorced    Spouse name: Not on file   Number of children: Not on file   Years of education: Not on file   Highest education level: Not on file  Occupational History   Not on file  Tobacco Use   Smoking status: Never   Smokeless  tobacco: Never  Vaping Use   Vaping status: Never Used  Substance and Sexual Activity   Alcohol use: No    Alcohol/week: 0.0 standard drinks of alcohol   Drug use: No   Sexual activity: Never  Other Topics Concern   Not on file  Social History Narrative   ** Merged History Encounter **       Lives in [place]; with mom and son; worked in dietary; never smoked; no alcohol.    Social Drivers of Corporate investment banker Strain: Low Risk  (12/01/2022)   Overall Financial Resource Strain (CARDIA)    Difficulty of Paying Living Expenses: Not hard at all  Food Insecurity: No Food Insecurity (06/10/2023)   Hunger Vital Sign    Worried About Running Out of Food in the Last Year: Never true    Ran Out of Food in the Last Year: Never true  Transportation Needs: No Transportation Needs (06/10/2023)   PRAPARE - Administrator, Civil Service (Medical): No    Lack of Transportation (Non-Medical): No  Physical Activity: Not on file  Stress: Not on file  Social Connections: Moderately Isolated (06/10/2023)   Social Connection and Isolation Panel [NHANES]    Frequency of Communication with Friends and Family: More than three times a week    Frequency of Social Gatherings with Friends and Family: More than three times a week    Attends Religious Services: More than 4 times per year    Active Member of [employer] or Organizations: No    Attends Banker Meetings: Never    Marital Status: Divorced  Catering manager Violence: Not At Risk (06/10/2023)   Humiliation, Afraid, Rape, and Kick questionnaire    Fear of Current or Ex-Partner: No    Emotionally Abused: No    Physically Abused: No    Sexually Abused: No    FAMILY HISTORY: Family History  Problem Relation Age of Onset   Diabetes Mother    Hyperlipidemia Mother    Hypertension Mother    Vision loss Mother    Arthritis Father    Hyperlipidemia Father    Hypertension Father    Breast cancer Maternal Aunt      ALLERGIES:  has no known allergies.  MEDICATIONS:  Current Outpatient Medications  Medication Sig Dispense Refill   acetaminophen (TYLENOL) 325 MG tablet Take 650 mg by mouth every 6 (six) hours as needed for moderate pain.     diphenoxylate-atropine (LOMOTIL) 2.5-0.025 MG tablet Take 1 tablet by mouth 4 (four) times daily as needed for diarrhea or loose stools. Take it along with immodium 60 tablet 0   feeding supplement (ENSURE ENLIVE / ENSURE PLUS) LIQD Take 237 mLs by mouth 2 (two) times daily between meals.     Ferrous Bisglycinate Chelate 28 MG CAPS Take 1 tablet by mouth daily.     gabapentin (NEURONTIN) 600 MG tablet Take 1 tablet (600 mg total) by mouth daily as needed. Home med.     lidocaine-prilocaine (EMLA) cream Apply on the port. 30 -45 min  prior to port access. 30 g 3   magic mouthwash (multi-ingredient) oral suspension Swish and swallow 5-10 mLs by mouth  4 (four) times daily as needed. 480 mL 3   OLANZapine (ZYPREXA) 5 MG tablet Take 1 tablet (5 mg total) by mouth at bedtime. 30 tablet 2   ondansetron (ZOFRAN) 8 MG tablet One pill every 8 hours as needed for nausea/vomitting. 40 tablet 1   prochlorperazine (COMPAZINE) 10 MG tablet Take 1 tablet (10 mg total) by mouth every 6 (six) hours as needed for nausea or vomiting. 40 tablet 1   sodium bicarbonate 325 MG tablet Take 2 tablets (650 mg total) by mouth 3 (three) times daily. 180 tablet 0   spironolactone (ALDACTONE) 25 MG tablet Take 1 tablet (25 mg total) by mouth 2 (two) times daily. 60 tablet 0   traMADol (ULTRAM) 50 MG tablet Take 1 tablet (50 mg total) by mouth every 8 (eight) hours as needed. 90 tablet 0   vitamin B-12 (CYANOCOBALAMIN) 1000 MCG tablet Take 1 tablet (1,000 mcg total) by mouth daily.     [Paused] metoprolol succinate (TOPROL-XL) 50 MG 24 hr tablet Take 1 tablet (50 mg total) by mouth daily. Take with or immediately following a meal. (Patient not taking: Reported on 07/11/2023) 30 tablet 0   potassium  chloride (KLOR-CON) 20 MEQ packet Take 40 mEq by mouth 2 (two) times daily for 7 days. Mix packet as directed. 28 packet 0   No current facility-administered medications for this visit.   Facility-Administered Medications Ordered in Other Visits  Medication Dose Route Frequency Provider Last Rate Last Admin   sodium chloride flush (NS) 0.9 % injection 10 mL  10 mL Intravenous PRN Louretta Shorten R, MD   10 mL at 06/23/21 1012      PHYSICAL EXAMINATION: ECOG PERFORMANCE STATUS: 0 - Asymptomatic  Vitals:   07/11/23 1522  BP: 125/85  Pulse: (!) 111  Resp: 18  Temp: (!) 97.1 F (36.2 C)  SpO2: 100%        Filed Weights   07/11/23 1522  Weight: 137 lb 3.2 oz (62.2 kg)       Physical Exam Vitals reviewed.  Constitutional:      Appearance: She is not ill-appearing.  HENT:     Head: Normocephalic and atraumatic.     Mouth/Throat:     Pharynx: No oropharyngeal exudate.  Eyes:     General: No scleral icterus. Cardiovascular:     Rate and Rhythm: Normal rate and regular rhythm.  Pulmonary:     Effort: Pulmonary effort is normal. No respiratory distress.     Breath sounds: No wheezing.  Abdominal:     General: There is no distension.     Palpations: Abdomen is soft.     Tenderness: There is no abdominal tenderness. There is no guarding.  Musculoskeletal:        General: No tenderness or deformity.  Lymphadenopathy:     Cervical: No cervical adenopathy.  Skin:    General: Skin is warm.     Coloration: Skin is not pale.  Neurological:     Mental Status: She is alert and oriented to person, place, and time.  Psychiatric:        Mood and Affect: Mood and affect normal.        Behavior: Behavior normal.    LABORATORY DATA:  I have reviewed the data as listed Lab Results  Component Value Date   WBC 6.3 07/11/2023   HGB 8.1 (L) 07/11/2023   HCT 25.3 (L) 07/11/2023   MCV 81.1 07/11/2023   PLT 173 07/11/2023   Recent Labs  06/11/23 0450 06/11/23 1654  06/11/23 2004 06/12/23 0258 06/17/23 0519 06/22/23 0851 07/11/23 1517  NA 127*   < >  --    < > 135 130* 133*  K 2.2*   < > 2.4*   < > 3.8 5.0 3.5  CL 99   < >  --    < > 110 102 100  CO2 17*   < >  --    < > 18* 18* 24  GLUCOSE 146*   < >  --    < > 104* 119* 140*  BUN 67*   < >  --    < > 9 9 10   CREATININE 3.71*   < >  --    < > 1.00 1.08* 0.95  CALCIUM 7.6*   < > 7.8*   < > 8.2* 8.6* 8.5*  GFRNONAA 13*   < >  --    < > >60 55* >60  PROT 6.0*  --   --   --   --  6.5 6.5  ALBUMIN 2.7*  --  2.7*  --   --  2.8* 3.0*  AST 21  --   --   --   --  16 25  ALT 22  --   --   --   --  15 15  ALKPHOS 93  --   --   --   --  108 87  BILITOT 0.6  --   --   --   --  0.8 0.8   < > = values in this interval not displayed.   Component Ref Range & Units 1 mo ago (06/25/22) 4 mo ago (03/26/22) 6 mo ago (01/22/22) 8 mo ago (11/23/21) 1 yr ago (06/23/21) 1 yr ago (04/20/21) 1 yr ago (01/23/21)  CA 19-9 0 - 35 U/mL 24 21 CM 17 CM 12 CM 14 CM 13 CM 16 CM     RADIOGRAPHIC STUDIES: I have personally reviewed the radiological images as listed and agreed with the findings in the report. MR LIVER W WO CONTRAST Result Date: 07/02/2023 CLINICAL DATA:  Pancreatic cancer, possible liver lesion EXAM: MRI ABDOMEN WITHOUT AND WITH CONTRAST TECHNIQUE: Multiplanar multisequence MR imaging of the abdomen was performed both before and after the administration of intravenous contrast. CONTRAST:  7 mL Vueway IV COMPARISON:  CT abdomen/pelvis dated 06/10/2023 FINDINGS: Although contrast was administered, the parenchyma is unenhanced. As such, contrast extravasation is suspected. Lower chest: Lung bases are clear. Hepatobiliary: Moderate hepatic steatosis areas of focal fatty sparing. No focal hepatic lesion is seen, noting limitation due to lack of intravascular contrast. Regardless, there is no restricted diffusion to correspond to the possible liver lesion on CT. Status post cholecystectomy. No intrahepatic or  extrahepatic ductal dilatation. Pancreas: Status post proximal pancreatectomy with pancreaticojejunostomy. Spleen:  Within normal limits. Adrenals/Urinary Tract:  Adrenal glands are within normal limits. Kidneys are within limits.  No hydronephrosis. Stomach/Bowel: Status post distal gastrectomy with gastrojejunostomy. Visualized bowel is unremarkable. Vascular/Lymphatic:  No evidence of aortic aneurysm. No suspicious abdominal lymphadenopathy. Other:  No abdominal ascites. Musculoskeletal: Mild degenerative changes of the lumbar spine. IMPRESSION: Limited evaluation due to lack of parenchymal enhancement, as described above. Within that constraint, no hepatic lesion is seen. Moderate hepatic steatosis with areas of focal fatty sparing. Postsurgical changes related to Whipple procedure. No findings suspicious for recurrent or metastatic disease. Electronically Signed   By: Charline Bills M.D.   On: 07/02/2023 00:27  ASSESSMENT & PLAN:   Cancer of head of pancreas (HCC) # AUG 2024- STAGE IV/METASTATIC- Recurrent [No biopsy] Pancreas adenocarcinoma-   NGS-tempus-No targets noted however MSI-QNS.  APRIL 2025- Blood tempus pending.    # Patient currently s/p  NALIRI chemotherapy #5 cycles-  OCT 29th, 2024- CT CAP/ PET scan: progression.   Intense metabolic activity at the junction of the pancreas and duodenum not changed from prior. New hypermetabolic tissue position between the pancreas in the SMA is concerning for nodal metastasis;  New smaller focus hypermotility just RIGHT of the takeoff of the SMA is also concerning for nodal metastasis; . overall concern for progression of local peripancreatic nodal metastasis.    # MARCH 2025-  PET  Persistent focus of hypermetabolism (8.98 versus previous 8.36) near the anastomosis and surrounding the vascular clips or sutures posterior to the pancreatic duct drain. This area appears larger.  Second hypermetabolic focus just to the left of the celiac axis has an  SUV max of 4.24. This area was much larger on the prior study and had an SUV max of 7.30.  On the prior study there was a third focus of hypermetabolism just to the left of the SMA which corresponded to a small lymph node. I do not see any measurable lymph node or any residual hypermetabolism on today study. No findings to suggest hepatic metastatic disease.  MRI- with contrast-Limited study no obvious liver lesion noted.  However cancer marker rising/elevated.  #  Also given poor tolerance/lack of response to current chemotherapy with NALIRI-I recommend discontinuation of current therapy.s/p  [facility]-re: pancreatic cancer/clinical trial. Discussed with Dr.Blobe-unable to order [facility] Fusion testing locally. I have reached out to [facility]. also informed Kristie stanton.   # MARCH 2025- acute renal failure/hypokalemia-attributed to prerenal/diarrhea- sec to chemo/ campylobacter [s/p azithromcyin]- UGTA-1 gennotype- pending.    # Severe Hypokalemia- Potassium  3.4-  improved today-  stable/.   # Sinus tachycardia-off metoprolol-reminded again the patient to restart metoprolol.   # back pain-  secondary to progressive disease:improved- currently on tramadol prn.  stable.    # Anemia- microcytic- Stable 9-10; NO IDA- ;  colo [EUS- 2021; oct 2022- colo; Dr.Anna]- stable.   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   # Hypertension- Continue Norvasc/Metoprlol.[ per pt At home- 130/70s   # mediport:  continue port flushes q 2-41M.stable.  # Anxiety/depression/insomnia: s/p Josh Borders- recommend compliance with  cymblata- stable.  # Weight loss- on zyprexa-   # DISPOSITION:  # Follow up in 4 week- MD; port-labs- cbc/cmp; Ca 19-9- - Dr.B    Gwyn Leos, MD 07/12/2023

## 2023-07-11 NOTE — Progress Notes (Unsigned)
 She forgot to start back on the metoprolol.

## 2023-07-11 NOTE — Assessment & Plan Note (Signed)
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  APRIL 2025- Blood tempus pending.    # Patient currently s/p  NALIRI chemotherapy #5 cycles-  OCT 29th, 2024- CT CAP/ PET scan: progression.   Intense metabolic activity at the junction of the pancreas and duodenum not changed from prior. New hypermetabolic tissue position between the pancreas in the SMA is concerning for nodal metastasis;  New smaller focus hypermotility just RIGHT of the takeoff of the SMA is also concerning for nodal metastasis; . overall concern for progression of local peripancreatic nodal metastasis.    # MARCH 2025-  PET  Persistent focus of hypermetabolism (8.98 versus previous 8.36) near the anastomosis and surrounding the vascular clips or sutures posterior to the pancreatic duct drain. This area appears larger.  Second hypermetabolic focus just to the left of the celiac axis has an SUV max of 4.24. This area was much larger on the prior study and had an SUV max of 7.30.  On the prior study there was a third focus of hypermetabolism just to the left of the SMA which corresponded to a small lymph node. I do not see any measurable lymph node or any residual hypermetabolism on today study. No findings to suggest hepatic metastatic disease.  MRI- with contrast-Limited study no obvious liver lesion noted.  However cancer marker rising/elevated.  #  Also given poor tolerance/lack of response to current chemotherapy with NALIRI-I recommend discontinuation of current therapy.s/p  [facility]-re: pancreatic cancer/clinical trial. Discussed with Dr.Blobe-unable to order [facility] Fusion testing locally. I have reached out to [facility]. also informed Kristie stanton.   # MARCH 2025- acute renal failure/hypokalemia-attributed to prerenal/diarrhea- sec to chemo/ campylobacter [s/p azithromcyin]- UGTA-1 gennotype- pending.    # Severe Hypokalemia- Potassium  3.4-   improved today-  stable/.   # Sinus tachycardia-off metoprolol-reminded again the patient to restart metoprolol.   # back pain-  secondary to progressive disease:improved- currently on tramadol prn.  stable.    # Anemia- microcytic- Stable 9-10; NO IDA- ;  colo [EUS- 2021; oct 2022- colo; Dr.Anna]- stable.   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   # Hypertension- Continue Norvasc/Metoprlol.[ per pt At home- 130/70s   # mediport:  continue port flushes q 2-26M.stable.  # Anxiety/depression/insomnia: s/p Josh Borders- recommend compliance with  cymblata- stable.  # Weight loss- on zyprexa-   # DISPOSITION:  # Follow up in 4 week- MD; port-labs- cbc/cmp; Ca 19-9- - Dr.B

## 2023-07-12 ENCOUNTER — Encounter: Payer: Self-pay | Admitting: Internal Medicine

## 2023-07-12 LAB — CANCER ANTIGEN 19-9: CA 19-9: 8258 U/mL — ABNORMAL HIGH (ref 0–35)

## 2023-07-19 ENCOUNTER — Encounter: Payer: Self-pay | Admitting: Internal Medicine

## 2023-08-08 ENCOUNTER — Encounter: Payer: Self-pay | Admitting: Internal Medicine

## 2023-08-08 ENCOUNTER — Inpatient Hospital Stay

## 2023-08-08 ENCOUNTER — Inpatient Hospital Stay: Attending: Internal Medicine | Admitting: Internal Medicine

## 2023-08-08 VITALS — BP 129/86 | HR 82 | Temp 97.0°F | Resp 18 | Wt 139.0 lb

## 2023-08-08 DIAGNOSIS — M79662 Pain in left lower leg: Secondary | ICD-10-CM

## 2023-08-08 DIAGNOSIS — M549 Dorsalgia, unspecified: Secondary | ICD-10-CM | POA: Diagnosis not present

## 2023-08-08 DIAGNOSIS — F419 Anxiety disorder, unspecified: Secondary | ICD-10-CM | POA: Diagnosis not present

## 2023-08-08 DIAGNOSIS — Z452 Encounter for adjustment and management of vascular access device: Secondary | ICD-10-CM | POA: Insufficient documentation

## 2023-08-08 DIAGNOSIS — R000 Tachycardia, unspecified: Secondary | ICD-10-CM | POA: Insufficient documentation

## 2023-08-08 DIAGNOSIS — C250 Malignant neoplasm of head of pancreas: Secondary | ICD-10-CM | POA: Diagnosis not present

## 2023-08-08 DIAGNOSIS — I10 Essential (primary) hypertension: Secondary | ICD-10-CM | POA: Insufficient documentation

## 2023-08-08 DIAGNOSIS — D509 Iron deficiency anemia, unspecified: Secondary | ICD-10-CM | POA: Insufficient documentation

## 2023-08-08 DIAGNOSIS — Z79899 Other long term (current) drug therapy: Secondary | ICD-10-CM | POA: Insufficient documentation

## 2023-08-08 DIAGNOSIS — E876 Hypokalemia: Secondary | ICD-10-CM | POA: Insufficient documentation

## 2023-08-08 DIAGNOSIS — F32A Depression, unspecified: Secondary | ICD-10-CM | POA: Diagnosis not present

## 2023-08-08 DIAGNOSIS — M7989 Other specified soft tissue disorders: Secondary | ICD-10-CM

## 2023-08-08 DIAGNOSIS — G4700 Insomnia, unspecified: Secondary | ICD-10-CM | POA: Diagnosis not present

## 2023-08-08 DIAGNOSIS — Z95828 Presence of other vascular implants and grafts: Secondary | ICD-10-CM

## 2023-08-08 LAB — CBC WITH DIFFERENTIAL (CANCER CENTER ONLY)
Abs Immature Granulocytes: 0.04 10*3/uL (ref 0.00–0.07)
Basophils Absolute: 0 10*3/uL (ref 0.0–0.1)
Basophils Relative: 0 %
Eosinophils Absolute: 0.1 10*3/uL (ref 0.0–0.5)
Eosinophils Relative: 2 %
HCT: 22.9 % — ABNORMAL LOW (ref 36.0–46.0)
Hemoglobin: 7.7 g/dL — ABNORMAL LOW (ref 12.0–15.0)
Immature Granulocytes: 1 %
Lymphocytes Relative: 24 %
Lymphs Abs: 1.7 10*3/uL (ref 0.7–4.0)
MCH: 26.4 pg (ref 26.0–34.0)
MCHC: 33.6 g/dL (ref 30.0–36.0)
MCV: 78.4 fL — ABNORMAL LOW (ref 80.0–100.0)
Monocytes Absolute: 0.5 10*3/uL (ref 0.1–1.0)
Monocytes Relative: 7 %
Neutro Abs: 4.9 10*3/uL (ref 1.7–7.7)
Neutrophils Relative %: 66 %
Platelet Count: 186 10*3/uL (ref 150–400)
RBC: 2.92 MIL/uL — ABNORMAL LOW (ref 3.87–5.11)
RDW: 16.4 % — ABNORMAL HIGH (ref 11.5–15.5)
WBC Count: 7.3 10*3/uL (ref 4.0–10.5)
nRBC: 0 % (ref 0.0–0.2)

## 2023-08-08 LAB — CMP (CANCER CENTER ONLY)
ALT: 19 U/L (ref 0–44)
AST: 26 U/L (ref 15–41)
Albumin: 2.6 g/dL — ABNORMAL LOW (ref 3.5–5.0)
Alkaline Phosphatase: 88 U/L (ref 38–126)
Anion gap: 8 (ref 5–15)
BUN: 8 mg/dL (ref 8–23)
CO2: 23 mmol/L (ref 22–32)
Calcium: 8 mg/dL — ABNORMAL LOW (ref 8.9–10.3)
Chloride: 103 mmol/L (ref 98–111)
Creatinine: 0.96 mg/dL (ref 0.44–1.00)
GFR, Estimated: >60 mL/min (ref >60)
Glucose, Bld: 117 mg/dL — ABNORMAL HIGH (ref 70–99)
Potassium: 3.1 mmol/L — ABNORMAL LOW (ref 3.5–5.1)
Sodium: 134 mmol/L — ABNORMAL LOW (ref 135–145)
Total Bilirubin: 1 mg/dL (ref 0.0–1.2)
Total Protein: 6.2 g/dL — ABNORMAL LOW (ref 6.5–8.1)

## 2023-08-08 MED ORDER — HEPARIN SOD (PORK) LOCK FLUSH 100 UNIT/ML IV SOLN
500.0000 [IU] | Freq: Once | INTRAVENOUS | Status: AC
  Administered 2023-08-08: 500 [IU] via INTRAVENOUS
  Filled 2023-08-08: qty 5

## 2023-08-08 MED ORDER — SODIUM CHLORIDE 0.9% FLUSH
10.0000 mL | Freq: Once | INTRAVENOUS | Status: AC
  Administered 2023-08-08: 10 mL via INTRAVENOUS
  Filled 2023-08-08: qty 10

## 2023-08-08 NOTE — Assessment & Plan Note (Addendum)
 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    #  Patient currently s/p  NALIRI chemotherapy #5 cycles-discontinued because of progression/poor tolerance [last chemo February 2025]-  MARCH 2025- PET  Persistent focus of hypermetabolism (8.98 versus previous 8.36) near the anastomosis and surrounding the vascular clips or sutures posterior to the pancreatic duct drain. This area appears larger.  Second hypermetabolic focus just to the left of the celiac axis has an SUV max of 4.24. This area was much larger on the prior study and had an SUV max of 7.30.  On the prior study there was a third focus of hypermetabolism just to the left of the SMA which corresponded to a small lymph node. I do not see any measurable lymph node or any residual hypermetabolism on today study. No findings to suggest hepatic metastatic disease.  MRI- with contrast-Limited study no obvious liver lesion noted.  However cancer marker rising/elevated. #    # Given the absence of any obvious targets-noted on NGS-options include conventional chemotherapy with cisplatin gemcitabine  vs-gemcitabine  Abraxane  [patient has grade 3 neurotoxicity].  The expected response rates from the chemotherapy is in the order of 10 to 20%.  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  # MARCH 2025- acute renal failure/hypokalemia-attributed to prerenal/diarrhea- sec to chemo/ campylobacter [s/p azithromcyin]- UGTA-1 gennotype- pending.    # LEFT LEG Swelling/pain-highly concerning for DVT.  Recommend stat ultrasound of the lower legs.  Consider anticoagulation with Eliquis  # Severe Hypokalemia- Potassium  3.4-  improved today-  stable/.   # Sinus tachycardia-off metoprolol -reminded again the patient to restart metoprolol .   # back pain-   secondary to progressive disease:improved- currently on tramadol  prn.  stable.    # Anemia- microcytic- Stable 9-10; NO IDA- ;  colo [EUS- 2021; oct 2022- colo; Dr.Anna]- stable.   # PN--1-2- gapapetin increased to 600 mg-not well controlled;  recommend compliance with cymbalta - stable.   # Hypertension- Continue Norvasc /Metoprlol.[ per pt At home- 130/70s   # mediport:  continue port flushes q 2-28M.stable.  # Anxiety/depression/insomnia: s/p Josh Borders- recommend compliance with  cymblata- stable.  # Weight loss- on zyprexa - poor tolerance insominia-   # ACP: # I had a long discussion with the patient and family given the incurable nature of the disease /poor tolerance of therapy.  I also discussed given general less than 6 months of life expectancy, I introduced hospice philosophy to the patient and family.    Discussed that goal of care should be directed to symptom management rather than treating the underlying disease; and in the process help improve quality of life rather than quantity.  Discussed with hospice team would include-nurse, nurse aide, social worker and chaplain for help take care of patient with physical/emotional needs.  I also reviewed in cause of emergency/acute cardiorespiratory failure patient could potentially undergo interventions including but not limited to chest compressions, intubation, defibrillation and antiarrhythmic to the right.  However given patient's frail status and incurable malignancy I would  recommend against any aggressive interventions.  I would recommend no code.  However no decisions made at this time. Recommend await evaluation with hospice.  Discussed with [employer].  # DISPOSITION: # US  LE LEG STAT-  # hospice referral re: pancreatic cancer # Follow up in 4 week- MD; port-labs- cbc/cmp; Ca 19-9- - Dr.B  # 40  minutes face-to-face with the patient discussing the above plan of care; more than 50% of time spent on prognosis/ natural history;  counseling and coordination.

## 2023-08-08 NOTE — Progress Notes (Signed)
 Right after she got out of the hospital back in March her left ankle and foot has been swollen, with some pain in her ankle. Patient hasn't been able to swallow her pills ongoing for a little while now.

## 2023-08-08 NOTE — Progress Notes (Signed)
 [facility] CONSULT NOTE  Patient Care Team: Entzminger, Iantha Mainland, MD as PCP - General (Internal Medicine) Katheen Palma, FNP (Family Medicine) Burnie Cartwright, RN as Registered Nurse Arlette Benders, RN (Inactive) as Registered Nurse Rochell Chroman, RN as Oncology Nurse Navigator Lockie Rima, MD as Consulting Physician (General Surgery) Gwyn Leos, MD as Consulting Physician (Internal Medicine)  CHIEF COMPLAINTS/PURPOSE OF CONSULTATION: pancreas adenocarcinoma  Oncology History Overview Note  #Pancreas adenocarcinoma-Stage IB- [id number]-[EUS- Dr.Spaete; [facility]/GI; mass 22x59mm; invading/abutting superior mesenteric vein; 2 enlarged lymph nodes peripancreatic/porta hepatis largest 10 x 5.8 mm-nonpathologic based on EUS criteria/no biopsy]-borderline resectable.  PET scan no evidence of distant metastatic disease.  #Biliary obstruction status post ERCP and stenting [Dr.Wohl]-  # SEP, 22,2021- GEM-ABRAXANE  s/p cycle 1-d1 [discontinued secondary to shortage]; s/p evaluation with Dr. Coye Diver September]  # 01/02/2020-FOLFIRINOX; Neulasta . S/p FOLFIRINOX 10 cycles- April 19th whipples-  s/p neoadjuvant FOLFIRINX. #10 cycles]-s/p Whipple's [on April 19th 2022]- ypT2 [3.5cm]; pN0/11;   DIS-CONTINUED  FOLFIRINOX cycle #11 & 12-  Sec to PN-2-3.  PANCREAS (EXOCRINE), CARCINOMA: Resection  Procedure: Whipple procedure  Tumor Site: Head of pancreas.  Tumor Size: 3.5 cm, slide measurement.  Histologic Type: Pancreatic ductal adenocarcinoma.  Histologic Grade: Moderately differentiated.  Tumor Extension: Into peripancreatic connective tissue.  Treatment Effect: Moderate treatment effect.  Lymphovascular Invasion: Not identified.  Perineural Invasion: Present.  Margins: All surgical margins are negative for carcinoma.  Regional Lymph Nodes:       Number of Lymph Nodes with Tumor: 0       Number of Lymph Nodes Examined: 15  Distant Metastasis:       Distant  Site(s) Involved: Not applicable.  Pathologic Stage Classification (pTNM, AJCC 8th Edition): ypT2, ypN0   # Borderline DM; HTN   AUG 18th, 2024- PET scan- Whipple procedure with abnormal hypermetabolism centered at the pancreaticojejunostomy, worrisome for disease recurrence. Associated pancreaticojejunostomy stent in place; Hypermetabolic adjacent retroperitoneal lymph nodes, worrisome for metastatic disease. Ca 19-9 rising.    SEP 3rd, 2024- single gem [3 weeks on 1 week off]-because of neuropathy.  # OCT 29th, 2024-CT scan abdomen pelvis-progression.  # DEC 2024-  NALIRI s/p #5- cycles-.MARCH 2025- PET scan- progression/ CT- ? Liver lesion [non- contrast]- MRI- limited study- NEG for liver lesion.   # MARCH 2025- acute renal failure/hypokalemia-attributed to prerenal/diarrhea- sec to chemo/ campylobacter [s/p azithromcyin]  # APRIL 2nd opinion at [facility]- Dr. Robbert Childes    Cancer of head of pancreas (HCC)  12/04/2019 Initial Diagnosis   Cancer of head of pancreas (HCC)   12/19/2019 - 12/19/2019 Chemotherapy   The patient had PACLitaxel -protein bound (ABRAXANE ) chemo infusion 250 mg, 125 mg/m2 = 250 mg, Intravenous,  Once, 1 of 4 cycles Administration: 250 mg (12/19/2019) gemcitabine  (GEMZAR ) 2,000 mg in sodium chloride  0.9 % 250 mL chemo infusion, 1,976 mg, Intravenous,  Once, 1 of 4 cycles Administration: 2,000 mg (12/19/2019)  for chemotherapy treatment.    01/02/2020 - 05/15/2020 Chemotherapy   Patient is on Treatment Plan : PANCREAS Modified FOLFIRINOX q14d x 4 cycles     11/30/2022 - 02/08/2023 Chemotherapy   Patient is on Treatment Plan : PANCREAS Gemcitabine  D1,8, (1000) q21d     03/14/2023 - 05/27/2023 Chemotherapy   Patient is on Treatment Plan : PANCREAS Liposomal Irinotecan  + Leucovorin  + 5-FU IVCI q14d      HISTORY OF PRESENTING ILLNESS: Patient ambulating-independently.  Accompanied by daughter.  Robin Daniels 68 y.o.  female history recurrent stage IV  pancreatic  adeno ca s/p  chemotherapy -discontinued NALIRI  sec to poor tolerance is here for a follow up.  Right after she got out of the hospital back in March her left ankle and foot has been swollen, with some pain in her ankle. Patient hasn't been able to swallow her pills ongoing for a little while now   Otherwise no hospitalizations.  Diarrhea better/resolved; appetite improving; no nausea or vomiting.  Poor appetite.  Positive for weight loss.  Patient continues to have tingling and numbness in the extremities- currently on gabapentin  not significantly better or worse.   No fever no chills  Review of Systems  Constitutional:  Negative for chills, diaphoresis, fever and malaise/fatigue.  HENT:  Negative for nosebleeds and sore throat.   Eyes:  Negative for double vision.  Respiratory:  Negative for cough, hemoptysis, sputum production, shortness of breath and wheezing.   Cardiovascular:  Negative for chest pain, palpitations, orthopnea and leg swelling.  Gastrointestinal:  Negative for abdominal pain, blood in stool, constipation, diarrhea, heartburn, melena, nausea and vomiting.  Genitourinary:  Negative for dysuria, frequency and urgency.  Musculoskeletal:  Negative for back pain and joint pain.  Skin: Negative.  Negative for itching and rash.  Neurological:  Positive for tingling. Negative for dizziness, focal weakness, weakness and headaches.  Endo/Heme/Allergies:  Does not bruise/bleed easily.  Psychiatric/Behavioral:  Negative for depression. The patient is not nervous/anxious and does not have insomnia.      MEDICAL HISTORY:  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/04/2019   Complication of anesthesia    Fluttering heart    Hyperlipidemia    Hypertension    Palliative care encounter 06/13/2023   PONV (postoperative nausea and vomiting)    Pre-diabetes    Vertigo    none recently    SURGICAL  HISTORY: Past Surgical History:  Procedure Laterality Date   CATARACT EXTRACTION W/PHACO Right 06/09/2021   Procedure: CATARACT EXTRACTION PHACO AND INTRAOCULAR LENS PLACEMENT (IOC) RIGHT malyugin;  Surgeon: Clair Crews, MD;  Location: [facility];  Service: Ophthalmology;  Laterality: Right;  12.40 1:07.0   COLONOSCOPY WITH PROPOFOL  N/A 01/13/2021   Procedure: COLONOSCOPY WITH PROPOFOL ;  Surgeon: Luke Salaam, MD;  Location: [facility] ENDOSCOPY;  Service: Gastroenterology;  Laterality: N/A;   ENDOSCOPIC RETROGRADE CHOLANGIOPANCREATOGRAPHY (ERCP) WITH PROPOFOL  N/A 11/16/2019   Procedure: ENDOSCOPIC RETROGRADE CHOLANGIOPANCREATOGRAPHY (ERCP) WITH PROPOFOL ;  Surgeon: Marnee Sink, MD;  Location: [facility] ENDOSCOPY;  Service: Endoscopy;  Laterality: N/A;   ERCP N/A 03/27/2020   Procedure: ENDOSCOPIC RETROGRADE CHOLANGIOPANCREATOGRAPHY (ERCP);  Surgeon: Marnee Sink, MD;  Location: [facility] ENDOSCOPY;  Service: Endoscopy;  Laterality: N/A;   EUS N/A 11/29/2019   Procedure: FULL UPPER ENDOSCOPIC ULTRASOUND (EUS) RADIAL;  Surgeon: Rayford Cake, MD;  Location: [facility] ENDOSCOPY;  Service: Gastroenterology;  Laterality: N/A;   IR FLUORO RM 30-60 MIN  07/30/2020   IR IMAGING GUIDED PORT INSERTION  12/14/2019   JEJUNOSTOMY Left 07/15/2020   Procedure: Tonette Franco;  Surgeon: Lockie Rima, MD;  Location: [facility] OR;  Service: General;  Laterality: Left;   LAPAROSCOPY N/A 07/15/2020   Procedure: DIAGNOSTIC LAPAROSCOPY;  Surgeon: Lockie Rima, MD;  Location: [facility] OR;  Service: General;  Laterality: N/A;   right knee replacement Right [id number]   SHOULDER ARTHROSCOPY W/ ROTATOR CUFF REPAIR Right    WHIPPLE PROCEDURE N/A 07/15/2020   Procedure: WHIPPLE PROCEDURE;  Surgeon: Lockie Rima, MD;  Location: [facility] OR;  Service: General;  Laterality: N/A;    SOCIAL HISTORY: Social History   Socioeconomic History   Marital status: Divorced    Spouse name: Not on file   Number of children: Not on file   Years of education:  Not on file   Highest education level: Not on file  Occupational History   Not on file  Tobacco Use   Smoking status: Never   Smokeless tobacco: Never  Vaping Use   Vaping status: Never Used  Substance and Sexual Activity   Alcohol use: No    Alcohol/week: 0.0 standard drinks of alcohol   Drug use: No   Sexual activity: Never  Other Topics Concern   Not on file  Social History Narrative   ** Merged History Encounter **       Lives in [place]; with mom and son; worked in dietary; never smoked; no alcohol.    Social Drivers of Corporate investment banker Strain: Low Risk  (12/01/2022)   Overall Financial Resource Strain (CARDIA)    Difficulty of Paying Living Expenses: Not hard at all  Food Insecurity: No Food Insecurity (06/10/2023)   Hunger Vital Sign    Worried About Running Out of Food in the Last Year: Never true    Ran Out of Food in the Last Year: Never true  Transportation Needs: No Transportation Needs (06/10/2023)   PRAPARE - Administrator, Civil Service (Medical): No    Lack of Transportation (Non-Medical): No  Physical Activity: Not on file  Stress: Not on file  Social Connections: Moderately Isolated (06/10/2023)   Social Connection and Isolation Panel [NHANES]    Frequency of Communication with Friends and Family: More than three times a week    Frequency of Social Gatherings with Friends and Family: More than three times a week    Attends Religious Services: More than 4 times per year    Active Member of [employer] or Organizations: No    Attends Banker Meetings: Never    Marital Status: Divorced  Catering manager Violence: Not At Risk (06/10/2023)   Humiliation, Afraid, Rape, and Kick questionnaire    Fear of Current or Ex-Partner: No    Emotionally Abused: No    Physically Abused: No    Sexually Abused: No    FAMILY HISTORY: Family History  Problem Relation Age of Onset   Diabetes Mother    Hyperlipidemia Mother    Hypertension  Mother    Vision loss Mother    Arthritis Father    Hyperlipidemia Father    Hypertension Father    Breast cancer Maternal Aunt     ALLERGIES:  has no known allergies.  MEDICATIONS:  Current Outpatient Medications  Medication Sig Dispense Refill   acetaminophen  (TYLENOL ) 325 MG tablet Take 650 mg by mouth every 6 (six) hours as needed for moderate pain.     diphenoxylate -atropine  (LOMOTIL ) 2.5-0.025 MG tablet Take 1 tablet by mouth 4 (four) times daily as needed for diarrhea or loose stools. Take it along with immodium 60 tablet 0   feeding supplement (ENSURE ENLIVE / ENSURE PLUS) LIQD Take 237 mLs by mouth 2 (two) times daily between meals.     Ferrous Bisglycinate  Chelate 28 MG CAPS Take 1 tablet by mouth daily.     gabapentin  (NEURONTIN ) 600 MG tablet Take 1 tablet (600 mg total) by mouth daily as needed. Home med.     lidocaine -prilocaine  (EMLA ) cream Apply on the port. 30 -  45 min  prior to port access. 30 g 3   magic mouthwash (multi-ingredient) oral suspension Swish and swallow 5-10 mLs by mouth 4 (four) times daily as needed. 480 mL 3   [Paused] metoprolol  succinate (TOPROL -XL) 50 MG 24 hr tablet Take 1 tablet (50 mg total) by mouth daily. Take with or immediately following a meal. 30 tablet 0   OLANZapine  (ZYPREXA ) 5 MG tablet Take 1 tablet (5 mg total) by mouth at bedtime. 30 tablet 2   ondansetron  (ZOFRAN ) 8 MG tablet One pill every 8 hours as needed for nausea/vomitting. 40 tablet 1   potassium chloride  (KLOR-CON ) 20 MEQ packet Take 40 mEq by mouth 2 (two) times daily for 7 days. Mix packet as directed. 28 packet 0   prochlorperazine  (COMPAZINE ) 10 MG tablet Take 1 tablet (10 mg total) by mouth every 6 (six) hours as needed for nausea or vomiting. 40 tablet 1   spironolactone  (ALDACTONE ) 25 MG tablet Take 1 tablet (25 mg total) by mouth 2 (two) times daily. 60 tablet 0   traMADol  (ULTRAM ) 50 MG tablet Take 1 tablet (50 mg total) by mouth every 8 (eight) hours as needed. 90 tablet  0   vitamin B-12 (CYANOCOBALAMIN ) 1000 MCG tablet Take 1 tablet (1,000 mcg total) by mouth daily.     No current facility-administered medications for this visit.   Facility-Administered Medications Ordered in Other Visits  Medication Dose Route Frequency Provider Last Rate Last Admin   sodium chloride  flush (NS) 0.9 % injection 10 mL  10 mL Intravenous PRN Jaquayla Hege R, MD   10 mL at 06/23/21 1012      PHYSICAL EXAMINATION: ECOG PERFORMANCE STATUS: 0 - Asymptomatic  Vitals:   08/08/23 1345  BP: 129/86  Pulse: 82  Resp: 18  Temp: (!) 97 F (36.1 C)  SpO2: 100%        Filed Weights   08/08/23 1345  Weight: 139 lb (63 kg)    Swelling of the left lower extremity noted.   Physical Exam Vitals reviewed.  Constitutional:      Appearance: She is not ill-appearing.  HENT:     Head: Normocephalic and atraumatic.     Mouth/Throat:     Pharynx: No oropharyngeal exudate.  Eyes:     General: No scleral icterus. Cardiovascular:     Rate and Rhythm: Normal rate and regular rhythm.  Pulmonary:     Effort: Pulmonary effort is normal. No respiratory distress.     Breath sounds: No wheezing.  Abdominal:     General: There is no distension.     Palpations: Abdomen is soft.     Tenderness: There is no abdominal tenderness. There is no guarding.  Musculoskeletal:        General: No tenderness or deformity.  Lymphadenopathy:     Cervical: No cervical adenopathy.  Skin:    General: Skin is warm.     Coloration: Skin is not pale.  Neurological:     Mental Status: She is alert and oriented to person, place, and time.  Psychiatric:        Mood and Affect: Mood and affect normal.        Behavior: Behavior normal.    LABORATORY DATA:  I have reviewed the data as listed Lab Results  Component Value Date   WBC 7.3 08/08/2023   HGB 7.7 (L) 08/08/2023   HCT 22.9 (L) 08/08/2023   MCV 78.4 (L) 08/08/2023   PLT 186 08/08/2023   Recent Labs  06/22/23 0851  07/11/23 1517 08/08/23 1316  NA 130* 133* 134*  K 5.0 3.5 3.1*  CL 102 100 103  CO2 18* 24 23  GLUCOSE 119* 140* 117*  BUN 9 10 8   CREATININE 1.08* 0.95 0.96  CALCIUM  8.6* 8.5* 8.0*  GFRNONAA 55* >60 >60  PROT 6.5 6.5 6.2*  ALBUMIN  2.8* 3.0* 2.6*  AST 16 25 26   ALT 15 15 19   ALKPHOS 108 87 88  BILITOT 0.8 0.8 1.0   Component Ref Range & Units 1 mo ago (06/25/22) 4 mo ago (03/26/22) 6 mo ago (01/22/22) 8 mo ago (11/23/21) 1 yr ago (06/23/21) 1 yr ago (04/20/21) 1 yr ago (01/23/21)  CA 19-9 0 - 35 U/mL 24 21 CM 17 CM 12 CM 14 CM 13 CM 16 CM     RADIOGRAPHIC STUDIES: I have personally reviewed the radiological images as listed and agreed with the findings in the report. No results found.    ASSESSMENT & PLAN:   Cancer of head of pancreas (HCC) # AUG 2024- STAGE IV/METASTATIC- Recurrent [No biopsy] Pancreas adenocarcinoma-   NGS-tempus-No targets noted however MSI-QNS.    #  Patient currently s/p  NALIRI chemotherapy #5 cycles-discontinued because of progression/poor tolerance [last chemo February 2025]-  MARCH 2025- PET  Persistent focus of hypermetabolism (8.98 versus previous 8.36) near the anastomosis and surrounding the vascular clips or sutures posterior to the pancreatic duct drain. This area appears larger.  Second hypermetabolic focus just to the left of the celiac axis has an SUV max of 4.24. This area was much larger on the prior study and had an SUV max of 7.30.  On the prior study there was a third focus of hypermetabolism just to the left of the SMA which corresponded to a small lymph node. I do not see any measurable lymph node or any residual hypermetabolism on today study. No findings to suggest hepatic metastatic disease.  MRI- with contrast-Limited study no obvious liver lesion noted.  However cancer marker rising/elevated. #    # Given the absence of any obvious targets-noted on NGS-options include conventional chemotherapy with cisplatin gemcitabine   vs-gemcitabine  Abraxane  [patient has grade 3 neurotoxicity].  The expected response rates from the chemotherapy is in the order of 10 to 20%.  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  # MARCH 2025- acute renal failure/hypokalemia-attributed to prerenal/diarrhea- sec to chemo/ campylobacter [s/p azithromcyin]- UGTA-1 gennotype- pending.    # LEFT LEG Swelling/pain-highly concerning for DVT.  Recommend stat ultrasound of the lower legs.  Consider anticoagulation with Eliquis  # Severe Hypokalemia- Potassium  3.4-  improved today-  stable/.   # Sinus tachycardia-off metoprolol -reminded again the patient to restart metoprolol .   # back pain-  secondary to progressive disease:improved- currently on tramadol  prn.  stable.    # Anemia- microcytic- Stable 9-10; NO IDA- ;  colo [EUS- 2021; oct 2022- colo; Dr.Anna]- stable.   # PN--1-2- gapapetin increased to 600 mg-not well controlled;  recommend compliance with cymbalta - stable.   # Hypertension- Continue Norvasc /Metoprlol.[ per pt At home- 130/70s   # mediport:  continue port flushes q 2-4M.stable.  # Anxiety/depression/insomnia: s/p Josh Borders- recommend compliance with  cymblata- stable.  # Weight loss- on zyprexa - poor tolerance insominia-   # ACP: # I had a long discussion with the patient and family given the incurable nature of the disease /poor tolerance of therapy.  I also discussed given general less than 6 months of life expectancy, I introduced hospice philosophy to the patient and family.    Discussed that goal of care should be directed to symptom management rather than treating the underlying disease; and in the process help improve quality of life rather than quantity.  Discussed with hospice team would include-nurse, nurse aide, social worker and chaplain for help take care of patient with  physical/emotional needs.  I also reviewed in cause of emergency/acute cardiorespiratory failure patient could potentially undergo interventions including but not limited to chest compressions, intubation, defibrillation and antiarrhythmic to the right.  However given patient's frail status and incurable malignancy I would  recommend against any aggressive interventions.  I would recommend no code.  However no decisions made at this time. Recommend await evaluation with hospice.  Discussed with [employer].  # DISPOSITION: # US  LE LEG STAT-  # hospice referral re: pancreatic cancer # Follow up in 4 week- MD; port-labs- cbc/cmp; Ca 19-9- - Dr.B  # 40 minutes face-to-face with the patient discussing the above plan of care; more than 50% of time spent on prognosis/ natural history; counseling and coordination.   Gwyn Leos, MD 08/08/2023

## 2023-08-09 ENCOUNTER — Other Ambulatory Visit: Payer: Self-pay | Admitting: Oncology

## 2023-08-09 ENCOUNTER — Ambulatory Visit
Admission: RE | Admit: 2023-08-09 | Discharge: 2023-08-09 | Disposition: A | Discharge status: Home / Self Care | Source: Ambulatory Visit | Attending: Internal Medicine | Admitting: Internal Medicine

## 2023-08-09 DIAGNOSIS — M7989 Other specified soft tissue disorders: Secondary | ICD-10-CM | POA: Insufficient documentation

## 2023-08-09 DIAGNOSIS — C250 Malignant neoplasm of head of pancreas: Secondary | ICD-10-CM | POA: Diagnosis present

## 2023-08-09 DIAGNOSIS — M79662 Pain in left lower leg: Secondary | ICD-10-CM | POA: Insufficient documentation

## 2023-08-09 LAB — CANCER ANTIGEN 19-9: CA 19-9: 17365 U/mL — ABNORMAL HIGH (ref 0–35)

## 2023-08-09 MED ORDER — APIXABAN (ELIQUIS) VTE STARTER PACK (10MG AND 5MG): ORAL_TABLET | ORAL | 0 refills | Status: DC

## 2023-08-10 ENCOUNTER — Inpatient Hospital Stay (HOSPITAL_BASED_OUTPATIENT_CLINIC_OR_DEPARTMENT_OTHER): Admitting: Hospice and Palliative Medicine

## 2023-08-10 DIAGNOSIS — C250 Malignant neoplasm of head of pancreas: Secondary | ICD-10-CM | POA: Diagnosis not present

## 2023-08-10 DIAGNOSIS — Z515 Encounter for palliative care: Secondary | ICD-10-CM | POA: Diagnosis not present

## 2023-08-10 NOTE — Progress Notes (Signed)
 Virtual Visit via Telephone Note  I connected with Robin Daniels on 08/10/23 at  3:40 PM EDT by telephone and verified that I am speaking with the correct person using two identifiers.  Location: Patient: Home Provider: Clinic   I discussed the limitations, risks, security and privacy concerns of performing an evaluation and management service by telephone and the availability of in person appointments. I also discussed with the patient that there may be a patient responsible charge related to this service. The patient expressed understanding and agreed to proceed.   History of Present Illness: Robin Daniels is a 68 y.o. 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  Patient has had depression and anxiety.  She was referred to palliative care to address goals and manage ongoing symptoms.    Observations/Objective: I called and spoke with patient by phone.    Patient recently saw Dr. Valentine Gasmen who discussed rising tumor markers and poor tolerance/lack of response to current chemotherapy.  Patient was referred to hospice.  I called and spoke with patient today.  Patient states that hospice called her but she asked for time to think about it.  Patient states an understanding that cancer is expected to progress and ultimately cause her to decline.  However, patient states that she feels much better off cancer treatment.  We discussed focusing on quality of life and I recommended that she involve hospice to achieve this goal.  However, patient states that she is unsure and wanted more time to think about hospice and decision making.  Similarly, patient states that she needs time to think about CODE STATUS and end-of-life care.  Assessment and Plan: Stage IV pancreatic cancer -off treatment.  Recommended hospice the patient wants time to think about decision making.  Follow Up Instructions: Follow-up telephone visit 2 to 3 weeks    I discussed the assessment and treatment plan with the patient. The patient was provided an opportunity to ask questions and all were answered. The patient agreed with the plan and demonstrated an understanding of the instructions.   The patient was advised to call back or seek an in-person evaluation if the symptoms worsen or if the condition fails to improve as anticipated.  I provided 10 minutes of non-face-to-face time during this encounter.   Peggyann Bower, NP

## 2023-08-25 ENCOUNTER — Other Ambulatory Visit: Payer: Self-pay | Admitting: Hospice and Palliative Medicine

## 2023-08-25 DIAGNOSIS — C250 Malignant neoplasm of head of pancreas: Secondary | ICD-10-CM

## 2023-08-25 NOTE — Progress Notes (Signed)
 Patient's daughter, [place], presented to clinic today. States patient refused hospice referral. However, patient is now reportedly declining, intermittently confused. Family has offered to send patient to ED but she refused this. Per daughter, patient is now agreeable to hospice and family would like another referral sent. Discussed end of life care and goals. I spoke with referral department for [facility] who will reach out to daughter to coordinate.

## 2023-08-31 ENCOUNTER — Telehealth: Admitting: Hospice and Palliative Medicine

## 2023-09-02 ENCOUNTER — Inpatient Hospital Stay
Admission: EM | Admit: 2023-09-02 | Discharge: 2023-09-05 | DRG: 689 | Disposition: A | Discharge status: Hospice | Attending: Internal Medicine | Admitting: Internal Medicine

## 2023-09-02 ENCOUNTER — Emergency Department

## 2023-09-02 ENCOUNTER — Other Ambulatory Visit: Payer: Self-pay

## 2023-09-02 DIAGNOSIS — E872 Acidosis, unspecified: Secondary | ICD-10-CM | POA: Diagnosis present

## 2023-09-02 DIAGNOSIS — I824Y2 Acute embolism and thrombosis of unspecified deep veins of left proximal lower extremity: Secondary | ICD-10-CM | POA: Diagnosis not present

## 2023-09-02 DIAGNOSIS — E876 Hypokalemia: Secondary | ICD-10-CM | POA: Diagnosis present

## 2023-09-02 DIAGNOSIS — K921 Melena: Secondary | ICD-10-CM | POA: Diagnosis present

## 2023-09-02 DIAGNOSIS — I82402 Acute embolism and thrombosis of unspecified deep veins of left lower extremity: Secondary | ICD-10-CM | POA: Diagnosis present

## 2023-09-02 DIAGNOSIS — K922 Gastrointestinal hemorrhage, unspecified: Secondary | ICD-10-CM | POA: Diagnosis present

## 2023-09-02 DIAGNOSIS — Z515 Encounter for palliative care: Secondary | ICD-10-CM | POA: Diagnosis not present

## 2023-09-02 DIAGNOSIS — R64 Cachexia: Secondary | ICD-10-CM | POA: Diagnosis present

## 2023-09-02 DIAGNOSIS — Z833 Family history of diabetes mellitus: Secondary | ICD-10-CM

## 2023-09-02 DIAGNOSIS — Z95828 Presence of other vascular implants and grafts: Secondary | ICD-10-CM

## 2023-09-02 DIAGNOSIS — N390 Urinary tract infection, site not specified: Principal | ICD-10-CM | POA: Diagnosis present

## 2023-09-02 DIAGNOSIS — E538 Deficiency of other specified B group vitamins: Secondary | ICD-10-CM | POA: Insufficient documentation

## 2023-09-02 DIAGNOSIS — E785 Hyperlipidemia, unspecified: Secondary | ICD-10-CM | POA: Diagnosis present

## 2023-09-02 DIAGNOSIS — D649 Anemia, unspecified: Secondary | ICD-10-CM

## 2023-09-02 DIAGNOSIS — Z96651 Presence of right artificial knee joint: Secondary | ICD-10-CM | POA: Diagnosis present

## 2023-09-02 DIAGNOSIS — Z8249 Family history of ischemic heart disease and other diseases of the circulatory system: Secondary | ICD-10-CM

## 2023-09-02 DIAGNOSIS — Z9221 Personal history of antineoplastic chemotherapy: Secondary | ICD-10-CM | POA: Diagnosis not present

## 2023-09-02 DIAGNOSIS — I82409 Acute embolism and thrombosis of unspecified deep veins of unspecified lower extremity: Secondary | ICD-10-CM

## 2023-09-02 DIAGNOSIS — C250 Malignant neoplasm of head of pancreas: Secondary | ICD-10-CM | POA: Diagnosis present

## 2023-09-02 DIAGNOSIS — Z803 Family history of malignant neoplasm of breast: Secondary | ICD-10-CM

## 2023-09-02 DIAGNOSIS — R6520 Severe sepsis without septic shock: Secondary | ICD-10-CM | POA: Diagnosis present

## 2023-09-02 DIAGNOSIS — R54 Age-related physical debility: Secondary | ICD-10-CM | POA: Diagnosis present

## 2023-09-02 DIAGNOSIS — Z66 Do not resuscitate: Secondary | ICD-10-CM | POA: Diagnosis present

## 2023-09-02 DIAGNOSIS — R000 Tachycardia, unspecified: Secondary | ICD-10-CM | POA: Diagnosis present

## 2023-09-02 DIAGNOSIS — R4182 Altered mental status, unspecified: Principal | ICD-10-CM

## 2023-09-02 DIAGNOSIS — D62 Acute posthemorrhagic anemia: Secondary | ICD-10-CM | POA: Diagnosis present

## 2023-09-02 DIAGNOSIS — Z789 Other specified health status: Secondary | ICD-10-CM | POA: Diagnosis not present

## 2023-09-02 DIAGNOSIS — Z83438 Family history of other disorder of lipoprotein metabolism and other lipidemia: Secondary | ICD-10-CM

## 2023-09-02 DIAGNOSIS — T45515A Adverse effect of anticoagulants, initial encounter: Secondary | ICD-10-CM | POA: Diagnosis present

## 2023-09-02 DIAGNOSIS — Z7189 Other specified counseling: Secondary | ICD-10-CM | POA: Diagnosis not present

## 2023-09-02 DIAGNOSIS — R748 Abnormal levels of other serum enzymes: Secondary | ICD-10-CM | POA: Diagnosis present

## 2023-09-02 DIAGNOSIS — Z7901 Long term (current) use of anticoagulants: Secondary | ICD-10-CM

## 2023-09-02 DIAGNOSIS — R6510 Systemic inflammatory response syndrome (SIRS) of non-infectious origin without acute organ dysfunction: Secondary | ICD-10-CM | POA: Diagnosis present

## 2023-09-02 DIAGNOSIS — Z1152 Encounter for screening for COVID-19: Secondary | ICD-10-CM | POA: Diagnosis not present

## 2023-09-02 DIAGNOSIS — Z821 Family history of blindness and visual loss: Secondary | ICD-10-CM

## 2023-09-02 DIAGNOSIS — I10 Essential (primary) hypertension: Secondary | ICD-10-CM | POA: Diagnosis present

## 2023-09-02 DIAGNOSIS — Z86718 Personal history of other venous thrombosis and embolism: Secondary | ICD-10-CM

## 2023-09-02 DIAGNOSIS — A419 Sepsis, unspecified organism: Secondary | ICD-10-CM | POA: Diagnosis present

## 2023-09-02 DIAGNOSIS — M1990 Unspecified osteoarthritis, unspecified site: Secondary | ICD-10-CM | POA: Diagnosis present

## 2023-09-02 DIAGNOSIS — Z90411 Acquired partial absence of pancreas: Secondary | ICD-10-CM

## 2023-09-02 DIAGNOSIS — Z8261 Family history of arthritis: Secondary | ICD-10-CM

## 2023-09-02 LAB — URINALYSIS, ROUTINE W REFLEX MICROSCOPIC
Bilirubin Urine: NEGATIVE
Glucose, UA: NEGATIVE mg/dL
Hgb urine dipstick: NEGATIVE
Ketones, ur: 20 mg/dL — AB
Leukocytes,Ua: NEGATIVE
Nitrite: NEGATIVE
Protein, ur: 30 mg/dL — AB
Specific Gravity, Urine: 1.024 (ref 1.005–1.030)
pH: 5 (ref 5.0–8.0)

## 2023-09-02 LAB — RESP PANEL BY RT-PCR (RSV, FLU A&B, COVID)  RVPGX2
Influenza A by PCR: NEGATIVE
Influenza B by PCR: NEGATIVE
Resp Syncytial Virus by PCR: NEGATIVE
SARS Coronavirus 2 by RT PCR: NEGATIVE

## 2023-09-02 LAB — CBC
HCT: 17.8 % — ABNORMAL LOW (ref 36.0–46.0)
HCT: 18.6 % — ABNORMAL LOW (ref 36.0–46.0)
Hemoglobin: 5.9 g/dL — ABNORMAL LOW (ref 12.0–15.0)
Hemoglobin: 6.5 g/dL — ABNORMAL LOW (ref 12.0–15.0)
MCH: 26.2 pg (ref 26.0–34.0)
MCH: 27.8 pg (ref 26.0–34.0)
MCHC: 33.1 g/dL (ref 30.0–36.0)
MCHC: 34.9 g/dL (ref 30.0–36.0)
MCV: 79.1 fL — ABNORMAL LOW (ref 80.0–100.0)
MCV: 79.5 fL — ABNORMAL LOW (ref 80.0–100.0)
Platelets: 280 10*3/uL (ref 150–400)
Platelets: 180 10*3/uL (ref 150–400)
RBC: 2.25 MIL/uL — ABNORMAL LOW (ref 3.87–5.11)
RBC: 2.34 MIL/uL — ABNORMAL LOW (ref 3.87–5.11)
RDW: 21.2 % — ABNORMAL HIGH (ref 11.5–15.5)
RDW: 18.7 % — ABNORMAL HIGH (ref 11.5–15.5)
WBC: 7.6 10*3/uL (ref 4.0–10.5)
WBC: 8.1 10*3/uL (ref 4.0–10.5)
nRBC: 0.5 % — ABNORMAL HIGH (ref 0.0–0.2)
nRBC: 0.4 % — ABNORMAL HIGH (ref 0.0–0.2)

## 2023-09-02 LAB — COMPREHENSIVE METABOLIC PANEL WITH GFR
ALT: 50 U/L — ABNORMAL HIGH (ref 0–44)
AST: 106 U/L — ABNORMAL HIGH (ref 15–41)
Albumin: 2.3 g/dL — ABNORMAL LOW (ref 3.5–5.0)
Alkaline Phosphatase: 113 U/L (ref 38–126)
Anion gap: 15 (ref 5–15)
BUN: 15 mg/dL (ref 8–23)
CO2: 21 mmol/L — ABNORMAL LOW (ref 22–32)
Calcium: 8.2 mg/dL — ABNORMAL LOW (ref 8.9–10.3)
Chloride: 102 mmol/L (ref 98–111)
Creatinine, Ser: 0.92 mg/dL (ref 0.44–1.00)
GFR, Estimated: >60 mL/min
Glucose, Bld: 125 mg/dL — ABNORMAL HIGH (ref 70–99)
Potassium: 3 mmol/L — ABNORMAL LOW (ref 3.5–5.1)
Sodium: 138 mmol/L (ref 135–145)
Total Bilirubin: 1.7 mg/dL — ABNORMAL HIGH (ref 0.0–1.2)
Total Protein: 6 g/dL — ABNORMAL LOW (ref 6.5–8.1)

## 2023-09-02 LAB — PREPARE RBC (CROSSMATCH)

## 2023-09-02 LAB — LACTIC ACID, PLASMA
Lactic Acid, Venous: 2.6 mmol/L (ref 0.5–1.9)
Lactic Acid, Venous: 3.5 mmol/L (ref 0.5–1.9)
Lactic Acid, Venous: 2.1 mmol/L (ref 0.5–1.9)

## 2023-09-02 MED ORDER — ONDANSETRON HCL 4 MG PO TABS: 4.0000 mg | ORAL_TABLET | Freq: Four times a day (QID) | ORAL | Status: DC | PRN

## 2023-09-02 MED ORDER — ONDANSETRON HCL 4 MG/2ML IJ SOLN
4.0000 mg | Freq: Four times a day (QID) | INTRAMUSCULAR | Status: DC | PRN
  Administered 2023-09-02: 4 mg via INTRAVENOUS
  Filled 2023-09-02: qty 2

## 2023-09-02 MED ORDER — ACETAMINOPHEN 325 MG PO TABS: 650.0000 mg | ORAL_TABLET | Freq: Four times a day (QID) | ORAL | Status: DC | PRN

## 2023-09-02 MED ORDER — POLYETHYLENE GLYCOL 3350 17 G PO PACK: 17.0000 g | PACK | Freq: Every day | ORAL | Status: DC | PRN

## 2023-09-02 MED ORDER — SODIUM CHLORIDE 0.9 % IV SOLN
10.0000 mL/h | Freq: Once | INTRAVENOUS | Status: AC
  Administered 2023-09-02: 10 mL/h via INTRAVENOUS

## 2023-09-02 MED ORDER — ACETAMINOPHEN 650 MG RE SUPP: 650.0000 mg | Freq: Four times a day (QID) | RECTAL | Status: DC | PRN

## 2023-09-02 MED ORDER — POTASSIUM CHLORIDE CRYS ER 20 MEQ PO TBCR
40.0000 meq | EXTENDED_RELEASE_TABLET | Freq: Once | ORAL | Status: AC
  Administered 2023-09-02: 40 meq via ORAL
  Filled 2023-09-02: qty 2

## 2023-09-02 MED ORDER — PANTOPRAZOLE SODIUM 40 MG IV SOLR
40.0000 mg | Freq: Two times a day (BID) | INTRAVENOUS | Status: DC
  Administered 2023-09-03 – 2023-09-05 (×5): 40 mg via INTRAVENOUS
  Filled 2023-09-02 (×5): qty 10

## 2023-09-02 MED ORDER — SODIUM CHLORIDE 0.9% FLUSH
3.0000 mL | Freq: Two times a day (BID) | INTRAVENOUS | Status: DC
  Administered 2023-09-02 – 2023-09-05 (×4): 3 mL via INTRAVENOUS

## 2023-09-02 MED ORDER — PANTOPRAZOLE SODIUM 40 MG IV SOLR: 40.0000 mg | Freq: Once | INTRAVENOUS | Status: DC

## 2023-09-02 MED ORDER — SODIUM CHLORIDE 0.9 % IV SOLN
2.0000 g | INTRAVENOUS | Status: DC
  Administered 2023-09-03 – 2023-09-05 (×3): 2 g via INTRAVENOUS
  Filled 2023-09-02 (×3): qty 20

## 2023-09-02 MED ORDER — SODIUM CHLORIDE 0.9 % IV SOLN: Freq: Once | INTRAVENOUS | Status: AC

## 2023-09-02 MED ORDER — LACTATED RINGERS IV SOLN: INTRAVENOUS | Status: AC

## 2023-09-02 MED ORDER — SODIUM CHLORIDE 0.9 % IV SOLN
2.0000 g | Freq: Once | INTRAVENOUS | Status: AC
  Administered 2023-09-02: 2 g via INTRAVENOUS
  Filled 2023-09-02: qty 20

## 2023-09-02 MED ORDER — OXYCODONE HCL 5 MG PO TABS
5.0000 mg | ORAL_TABLET | Freq: Four times a day (QID) | ORAL | Status: DC | PRN
  Administered 2023-09-04 (×2): 5 mg via ORAL
  Filled 2023-09-02 (×2): qty 1

## 2023-09-02 NOTE — Assessment & Plan Note (Signed)
-   Holding Eliquis  in the setting of acute anemia and suspected GI bleed

## 2023-09-02 NOTE — Assessment & Plan Note (Signed)
 Mildly elevated LFTs today compared to prior.  Unclear etiology, however potentially due to hypoperfusion due to elevated lactic acid.  Discussed possible CT scan with patient; given her goals of care discussion earlier, she is not sure she would like to pursue any further imaging at this time.  - Repeat CMP in the a.m.

## 2023-09-02 NOTE — ED Triage Notes (Addendum)
 Coming from home. Decreased appetite, foul odor in urine, dysuria and confusion per daughter x2 weeks ago. Receiving chemo for pancreatic Ca. GCS 14. Pt had to be assisted out of car, too weak to stand

## 2023-09-02 NOTE — Assessment & Plan Note (Signed)
 Patient is presenting with significant tachycardia, tachypnea and elevated lactic acid with concerns for UTI given new dysuria.  Overall, however suspect her acute on chronic anemia and advancing pancreatic cancer is driving her presentation though.  Lactic acid increased despite 1 L; will recheck after unit blood and additional 1 L fluids.  - Telemetry monitoring - Continue Rocephin 2 g daily - Urine and blood cultures pending - IV fluids as ordered - Trend lactic acid

## 2023-09-02 NOTE — H&P (Signed)
 History and Physical    Patient: Robin Daniels:[id number] DOB: October 01, 1955 DOA: 09/02/2023 DOS: the patient was seen and examined on 09/02/2023 PCP: Orlinda Blackbird Iantha Mainland, MD  Patient coming from: Home  Chief Complaint:  Chief Complaint  Patient presents with   Altered Mental Status   HPI: Robin Daniels is a 68 y.o. female with medical history significant of Stage IV metastatic pancreatic adenocarcinoma s/p chemotherapy and Whipple, recent DVT on Eliquis , chronic anemia, hypertension, hyperlipidemia, vitamin B12 deficiency, who presents to the ED due to altered mental status.  History obtained from both patient and her daughter at bedside.  Patient's daughter states that over the last 2-3 weeks, she has noticed significant decline in patient's functional status, with worsening weakness and fatigue with any exertion.  Then over the last couple days, her fatigue and weakness has further worsened with development of intermittent confusion.  For the last 1 day, Robin Daniels endorses dysuria with tea colored urine.  They also endorse dark stool that is almost black, occurring intermittently.  They deny any hematochezia or hematemesis.  She denies any palpitations, shortness of breath or chest pain at this time.  She denies any abdominal pain.  ED course: On arrival to the ED, patient was normotensive at 120/68 with heart rate of 130.  She was saturating at 100% on room air.  She was afebrile at 97.9.  Initial workup notable for WBC 7.6, hemoglobin of 5.9, potassium 3.0, bicarb 21, AST 106, ALT 50, total bilirubin 1.7 and GFR of 60.  Lactic acid 2.6 and then 3.5.  Urinalysis with unclean catch, however ketonuria and many bacteria.  CT of the head without acute abnormalities.  Chest x-ray without acute disease.  Patient started on IV fluids, 1 unit of packed RBC, and Rocephin.  TRH contacted for admission.   Review of Systems: As mentioned in the history of present illness. All other systems reviewed and  are negative.  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/04/2019   Complication of anesthesia    Fluttering heart    Hyperlipidemia    Hypertension    Palliative care encounter 06/13/2023   PONV (postoperative nausea and vomiting)    Pre-diabetes    Vertigo    none recently   Past Surgical History:  Procedure Laterality Date   CATARACT EXTRACTION W/PHACO Right 06/09/2021   Procedure: CATARACT EXTRACTION PHACO AND INTRAOCULAR LENS PLACEMENT (IOC) RIGHT malyugin;  Surgeon: Clair Crews, MD;  Location: [facility];  Service: Ophthalmology;  Laterality: Right;  12.40 1:07.0   COLONOSCOPY WITH PROPOFOL  N/A 01/13/2021   Procedure: COLONOSCOPY WITH PROPOFOL ;  Surgeon: Luke Salaam, MD;  Location: [facility];  Service: Gastroenterology;  Laterality: N/A;   ENDOSCOPIC RETROGRADE CHOLANGIOPANCREATOGRAPHY (ERCP) WITH PROPOFOL  N/A 11/16/2019   Procedure: ENDOSCOPIC RETROGRADE CHOLANGIOPANCREATOGRAPHY (ERCP) WITH PROPOFOL ;  Surgeon: Marnee Sink, MD;  Location: [facility] ENDOSCOPY;  Service: Endoscopy;  Laterality: N/A;   ERCP N/A 03/27/2020   Procedure: ENDOSCOPIC RETROGRADE CHOLANGIOPANCREATOGRAPHY (ERCP);  Surgeon: Marnee Sink, MD;  Location: [facility] ENDOSCOPY;  Service: Endoscopy;  Laterality: N/A;   EUS N/A 11/29/2019   Procedure: FULL UPPER ENDOSCOPIC ULTRASOUND (EUS) RADIAL;  Surgeon: Rayford Cake, MD;  Location: [facility] ENDOSCOPY;  Service: Gastroenterology;  Laterality: N/A;   IR FLUORO RM 30-60 MIN  07/30/2020   IR IMAGING GUIDED PORT INSERTION  12/14/2019   JEJUNOSTOMY Left 07/15/2020  Procedure: JEJUNOSTOMY;  Surgeon: Lockie Rima, MD;  Location: [facility] OR;  Service: General;  Laterality: Left;   LAPAROSCOPY N/A 07/15/2020   Procedure: DIAGNOSTIC LAPAROSCOPY;  Surgeon: Lockie Rima, MD;  Location: [facility] OR;  Service: General;  Laterality: N/A;   right knee replacement Right [id number]   SHOULDER  ARTHROSCOPY W/ ROTATOR CUFF REPAIR Right    WHIPPLE PROCEDURE N/A 07/15/2020   Procedure: WHIPPLE PROCEDURE;  Surgeon: Lockie Rima, MD;  Location: [facility] OR;  Service: General;  Laterality: N/A;   Social History:  reports that she has never smoked. She has never used smokeless tobacco. She reports that she does not drink alcohol and does not use drugs.  No Known Allergies  Family History  Problem Relation Age of Onset   Diabetes Mother    Hyperlipidemia Mother    Hypertension Mother    Vision loss Mother    Arthritis Father    Hyperlipidemia Father    Hypertension Father    Breast cancer Maternal Aunt     Prior to Admission medications   Medication Sig Start Date End Date Taking? Authorizing Provider  APIXABAN  (ELIQUIS ) VTE STARTER PACK (10MG  AND 5MG ) Take as directed on package: start with two-5mg  tablets twice daily for 7 days. On day 8, switch to one-5mg  tablet twice daily. 08/09/23  Yes Avonne Boettcher, MD  acetaminophen  (TYLENOL ) 325 MG tablet Take 650 mg by mouth every 6 (six) hours as needed for moderate pain. Patient not taking: Reported on 09/02/2023    [provider]  diphenoxylate -atropine  (LOMOTIL ) 2.5-0.025 MG tablet Take 1 tablet by mouth 4 (four) times daily as needed for diarrhea or loose stools. Take it along with immodium Patient not taking: Reported on 09/02/2023 02/22/23   Brahmanday, Govinda R, MD  feeding supplement (ENSURE ENLIVE / ENSURE PLUS) LIQD Take 237 mLs by mouth 2 (two) times daily between meals. Patient not taking: Reported on 09/02/2023 06/17/23   Garrison Kanner, MD  Ferrous Bisglycinate  Chelate 28 MG CAPS Take 1 tablet by mouth daily. Patient not taking: Reported on 09/02/2023 02/01/23   Nelda Balsam, NP  gabapentin  (NEURONTIN ) 600 MG tablet Take 1 tablet (600 mg total) by mouth daily as needed. Home med. Patient not taking: Reported on 09/02/2023 06/17/23   Garrison Kanner, MD  lidocaine -prilocaine  (EMLA ) cream Apply on the port. 30 -45 min  prior to port  access. Patient not taking: Reported on 09/02/2023 11/23/22   Brahmanday, Govinda R, MD  magic mouthwash (multi-ingredient) oral suspension Swish and swallow 5-10 mLs by mouth 4 (four) times daily as needed. Patient not taking: Reported on 09/02/2023 05/11/23   Brahmanday, Govinda R, MD  metoprolol  succinate (TOPROL -XL) 50 MG 24 hr tablet Take 1 tablet (50 mg total) by mouth daily. Take with or immediately following a meal. Patient not taking: Reported on 09/02/2023 05/27/20   Brahmanday, Govinda R, MD  OLANZapine  (ZYPREXA ) 5 MG tablet Take 1 tablet (5 mg total) by mouth at bedtime. Patient not taking: Reported on 09/02/2023 06/22/23   Brahmanday, Govinda R, MD  ondansetron  (ZOFRAN ) 8 MG tablet One pill every 8 hours as needed for nausea/vomitting. Patient not taking: Reported on 09/02/2023 11/23/22   Gwyn Leos, MD  potassium chloride  (KLOR-CON ) 20 MEQ packet Take 40 mEq by mouth 2 (two) times daily for 7 days. Mix packet as directed. 06/17/23 08/08/23  Garrison Kanner, MD  prochlorperazine  (COMPAZINE ) 10 MG tablet Take 1 tablet (10 mg total) by mouth every 6 (six) hours as needed for nausea or vomiting.  Patient not taking: Reported on 09/02/2023 11/23/22   Brahmanday, Govinda R, MD  spironolactone  (ALDACTONE ) 25 MG tablet Take 1 tablet (25 mg total) by mouth 2 (two) times daily. Patient not taking: Reported on 09/02/2023 06/17/23   Garrison Kanner, MD  traMADol  (ULTRAM ) 50 MG tablet Take 1 tablet (50 mg total) by mouth every 8 (eight) hours as needed. Patient not taking: Reported on 09/02/2023 03/14/23   Brahmanday, Govinda R, MD  vitamin B-12 (CYANOCOBALAMIN ) 1000 MCG tablet Take 1 tablet (1,000 mcg total) by mouth daily. Patient not taking: Reported on 09/02/2023 11/17/19   Sheril Dines, MD    Physical Exam: Vitals:   09/02/23 1537 09/02/23 1636 09/02/23 1709 09/02/23 1918  BP:  (!) 150/87 132/64 (!) 121/53  Pulse:  (!) 121 (!) 104 (!) 113  Resp:  16 18 (!) 21  Temp:  97.8 F (36.6 C) 97.9 F (36.6 C) 98.2 F  (36.8 C)  TempSrc:  Oral Oral Oral  SpO2: 99% 100% 100% 100%   Physical Exam Vitals and nursing note reviewed.  Constitutional:      Appearance: She is cachectic. She is ill-appearing.  HENT:     Head: Normocephalic and atraumatic.     Mouth/Throat:     Mouth: Mucous membranes are dry.  Eyes:     Conjunctiva/sclera: Conjunctivae normal.     Pupils: Pupils are equal, round, and reactive to light.  Cardiovascular:     Rate and Rhythm: Regular rhythm. Tachycardia present.     Heart sounds: No murmur heard. Pulmonary:     Effort: Pulmonary effort is normal. No respiratory distress.     Breath sounds: Normal breath sounds. No wheezing or rales.  Abdominal:     General: Bowel sounds are normal. There is no distension.     Palpations: Abdomen is soft.     Tenderness: There is no abdominal tenderness. There is no guarding.  Musculoskeletal:     Right lower leg: No edema.     Left lower leg: Edema (+2) present.  Skin:    General: Skin is warm and dry.     Findings: No bruising or erythema.  Neurological:     Mental Status: She is alert.     Comments:  Patient is alert and oriented at this time.  Generalized weakness noted  Psychiatric:        Mood and Affect: Mood normal.        Behavior: Behavior normal.    Data Reviewed: CBC with WBC of 7.6, hemoglobin of 5.9, MCV of 79, platelets of 280 CMP with sodium of 138, potassium 3.0, bicarb 21, glucose 125, BUN 15, creatinine 0.92, albumin  2.3, AST 106, ALT 50, total bilirubin 1.7 and GFR above 60 Lactic acid 2.6 and then 3.5 COVID-19, influenza and RSV PCR negative Urinalysis with ketonuria and many bacteria, although elevated squamous epithelial cells.  No RBC or WBC noted.  EKG personally reviewed.  Sinus tachycardia with rate of 135.  ST inversion in lead I and II, however no other ischemic changes.  CT Head Wo Contrast Result Date: 09/02/2023 CLINICAL DATA:  Mental status change, unknown cause Active chemotherapy for  pancreatic cancer. EXAM: CT HEAD WITHOUT CONTRAST TECHNIQUE: Contiguous axial images were obtained from the base of the skull through the vertex without intravenous contrast. RADIATION DOSE REDUCTION: This exam was performed according to the departmental dose-optimization program which includes automated exposure control, adjustment of the mA and/or kV according to patient size and/or use of iterative reconstruction technique. COMPARISON:  None Available. FINDINGS: Brain: No intracranial hemorrhage, mass effect, or midline shift. No hydrocephalus. The basilar cisterns are patent. 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 No evidence of territorial infarct or acute ischemia. No extra-axial or intracranial fluid collection. Vascular: Atherosclerosis of skullbase vasculature without hyperdense vessel or abnormal calcification. Skull: No fracture or focal lesion. Sinuses/Orbits: Paranasal sinuses and mastoid air cells are clear. The visualized orbits are unremarkable. Other: None. IMPRESSION: 1. No acute intracranial abnormality. 2. 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 Electronically Signed   By: Chadwick Colonel M.D.   On: 09/02/2023 16:08   DG Chest Port 1 View Result Date: 09/02/2023 CLINICAL DATA:  Weakness. Altered mental status. Decreased appetite. Dysuria. Foul odor in urine. Undergoing chemotherapy for pancreatic cancer. EXAM: PORTABLE CHEST 1 VIEW COMPARISON:  06/10/2023. Chest, abdomen and pelvis CT dated 01/25/2023. FINDINGS: The heart remains normal in size. Tortuous and partially calcified thoracic aorta. Clear lungs with normal vascularity. Stable right jugular porta catheter with its tip in the inferior aspect of the superior vena cava. 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 Unremarkable bones. IMPRESSION: No acute abnormality. Electronically Signed   By: Catherin Closs M.D.   On: 09/02/2023 15:07   Results are pending, will review when  available.  Assessment and Plan:  * Sepsis (HCC) Patient is presenting with significant tachycardia, tachypnea and elevated lactic acid with concerns for UTI given new dysuria.  Overall, however suspect her acute on chronic anemia and advancing pancreatic cancer is driving her presentation though.  Lactic acid increased despite 1 L; will recheck after unit blood and additional 1 L fluids.  - Telemetry monitoring - Continue Rocephin 2 g daily - Urine and blood cultures pending - IV fluids as ordered - Trend lactic acid  Acute on chronic anemia Per chart review, patient has a history of chronic anemia in the setting of malignancy ranging around 8 generally, now 5.9 after starting Eliquis  3 weeks ago for acute DVT.  She endorses dark, stool concerning for melena.   -1 unit of packed RBC ordered - Recheck CBC posttransfusion - Continue to transfuse for hemoglobin less than 7  GI bleed New melanotic stools over the last couple weeks after starting Eliquis , concerning for upper GI bleed.  Given patient's advancing cancer and poor functional status, patient is not interested in endoscopy at this time.  - Start Protonix  40 mg IV twice daily - Hold Eliquis   Elevated liver enzymes Mildly elevated LFTs today compared to prior.  Unclear etiology, however potentially due to hypoperfusion due to elevated lactic acid.  Discussed possible CT scan with patient; given her goals of care discussion earlier, she is not sure she would like to pursue any further imaging at this time.  - Repeat CMP in the a.m.  Goals of care, counseling/discussion Discussed goals of care with Robin Daniels and her daughter at bedside.  She has been considering hospice care after her discussions with oncology, and would like to pursue that path at this time.  She would like to avoid any heroic or invasive measurements to prolong life.  During this hospitalization, she would like to specifically avoid endoscopy, IVC filter  placement and possibly any further imaging.  - DNR/DNI  Cancer of head of pancreas (HCC) Stage IVb metastatic pancreatic adenocarcinoma s/p Whipple and chemotherapy.  Acute DVT (deep venous thrombosis) (HCC) - Holding Eliquis  in the setting of acute anemia and suspected GI bleed  Hypertension goal BP (blood pressure) <  140/90 - Hold home antihypertensives  Advance Care Planning:   Code Status: Limited: Do not attempt resuscitation (DNR) -DNR-LIMITED -Do Not Intubate/DNI    Consults: None  Family Communication: Patient's daughter updated at bedside  Severity of Illness: The appropriate patient status for this patient is INPATIENT. Inpatient status is judged to be reasonable and necessary in order to provide the required intensity of service to ensure the patient's safety. The patient's presenting symptoms, physical exam findings, and initial radiographic and laboratory data in the context of their chronic comorbidities is felt to place them at high risk for further clinical deterioration. Furthermore, it is not anticipated that the patient will be medically stable for discharge from the hospital within 2 midnights of admission.   * I certify that at the point of admission it is my clinical judgment that the patient will require inpatient hospital care spanning beyond 2 midnights from the point of admission due to high intensity of service, high risk for further deterioration and high frequency of surveillance required.*  Author: Avi Body, MD 09/02/2023 7:42 PM  For on call review [email or web address].

## 2023-09-02 NOTE — Consult Note (Signed)
 PHARMACY CONSULT NOTE - FOLLOW UP  Pharmacy Consult for Electrolyte Monitoring and Replacement   Recent Labs: Potassium (mmol/L)  Date Value  09/02/2023 3.0 (L)   Magnesium  (mg/dL)  Date Value  82/95/6213 1.7   Calcium  (mg/dL)  Date Value  08/65/7846 8.2 (L)   Albumin  (g/dL)  Date Value  96/29/5284 2.3 (L)   Phosphorus (mg/dL)  Date Value  13/24/4010 5.5 (H)   Sodium (mmol/L)  Date Value  09/02/2023 138     Assessment: 68 y.o. female with medical history significant of Stage IV metastatic pancreatic adenocarcinoma s/p chemotherapy and Whipple, recent DVT on Eliquis , chronic anemia, hypertension, hyperlipidemia, vitamin B12 deficiency, who presents to the ED due to altered mental status. Pt takes spironolactone  PTA.   Goal of Therapy:  WNL  Plan:  Kcl 40 mEq x 1 PO  F/u with AM labs.   Trinidad Funk ,PharmD Clinical Pharmacist 09/02/2023 7:56 PM

## 2023-09-02 NOTE — ED Provider Notes (Signed)
 [facility] Provider Note    Event Date/Time   First MD Initiated Contact with Patient 09/02/23 1442     (approximate)   History   Altered Mental Status   HPI  Robin Daniels is a 68 y.o. female with a history of pancreatic cancer presents with increasing weakness, foul-smelling urine and increasing confusion over the last week per daughter.  She reports she has no energy.  No fevers reported     Physical Exam   Triage Vital Signs: ED Triage Vitals  Encounter Vitals Group     BP 09/02/23 1325 120/68     Systolic BP Percentile --      Diastolic BP Percentile --      Pulse Rate 09/02/23 1325 (!) 130     Resp 09/02/23 1325 18     Temp 09/02/23 1323 97.9 F (36.6 C)     Temp Source 09/02/23 1323 Oral     SpO2 09/02/23 1325 100 %     Weight --      Height --      Head Circumference --      Peak Flow --      Pain Score --      Pain Loc --      Pain Education --      Exclude from Growth Chart --     Most recent vital signs: Vitals:   09/02/23 1530 09/02/23 1537  BP: 131/70   Pulse: (!) 130   Resp: 16   Temp:    SpO2: 99% 99%     General: Awake,  CV:  Good peripheral perfusion.  Tachycardia Resp:  Normal effort.  Abd:  No distention.  Soft, nontender Other:  Rectal exam, dark stool guaiac positive   ED Results / Procedures / Treatments   Labs (all labs ordered are listed, but only abnormal results are displayed) Labs Reviewed  COMPREHENSIVE METABOLIC PANEL WITH GFR - Abnormal; Notable for the following components:      Result Value   Potassium 3.0 (*)    CO2 21 (*)    Glucose, Bld 125 (*)    Calcium  8.2 (*)    Total Protein 6.0 (*)    Albumin  2.3 (*)    AST 106 (*)    ALT 50 (*)    Total Bilirubin 1.7 (*)    All other components within normal limits  CBC - Abnormal; Notable for the following components:   RBC 2.25 (*)    Hemoglobin 5.9 (*)    HCT 17.8 (*)    MCV 79.1 (*)    RDW 21.2 (*)    nRBC 0.5 (*)    All other  components within normal limits  LACTIC ACID, PLASMA - Abnormal; Notable for the following components:   Lactic Acid, Venous 2.6 (*)    All other components within normal limits  CULTURE, BLOOD (ROUTINE X 2)  CULTURE, BLOOD (ROUTINE X 2)  URINE CULTURE  RESP PANEL BY RT-PCR (RSV, FLU A&B, COVID)  RVPGX2  URINALYSIS, ROUTINE W REFLEX MICROSCOPIC  LACTIC ACID, PLASMA  CBG MONITORING, ED  PREPARE RBC (CROSSMATCH)  TYPE AND SCREEN     EKG  ED ECG REPORT I, Bryson Carbine, the attending physician, personally viewed and interpreted this ECG.  Date: 09/02/2023  Rhythm: Tachycardia QRS Axis: normal Intervals: normal ST/T Wave abnormalities: normal Narrative Interpretation: no evidence of acute ischemia    RADIOLOGY Chest x-ray viewed to read by me, no acute abnormality  PROCEDURES:  Critical Care performed: yes  CRITICAL CARE Performed by: Bryson Carbine   Total critical care time: 30 minutes  Critical care time was exclusive of separately billable procedures and treating other patients.  Critical care was necessary to treat or prevent imminent or life-threatening deterioration.  Critical care was time spent personally by me on the following activities: development of treatment plan with patient and/or surrogate as well as nursing, discussions with consultants, evaluation of patient's response to treatment, examination of patient, obtaining history from patient or surrogate, ordering and performing treatments and interventions, ordering and review of laboratory studies, ordering and review of radiographic studies, pulse oximetry and re-evaluation of patient's condition.   Procedures   MEDICATIONS ORDERED IN ED: Medications  lactated ringers  infusion ( Intravenous New Bag/Given 09/02/23 1507)  cefTRIAXone (ROCEPHIN) 2 g in sodium chloride  0.9 % 100 mL IVPB (2 g Intravenous New Bag/Given 09/02/23 1526)  0.9 %  sodium chloride  infusion (has no administration in time  range)  0.9 %  sodium chloride  infusion ( Intravenous New Bag/Given 09/02/23 1527)     IMPRESSION / MDM / ASSESSMENT AND PLAN / ED COURSE  I reviewed the triage vital signs and the nursing notes. Patient's presentation is most consistent with acute presentation with potential threat to life or bodily function.  Patient presents with weakness, foul-smelling urine, confusion.  Differential is extensive, includes sepsis, UTI, metastatic disease  Patient is tachycardic upon arrival.  Lactic acid is elevated concerning for sepsis, strong suspicion for UTI, will start IV Rocephin.  Noted to be anemic as well, hemoglobin of 5.6 from baseline of 8, she has dark stool guaiac positive, will order a unit of packed red blood cells  CT scan pending given history of pancreatic cancer and altered mental status, urinalysis pending  Have asked my colleague to follow-up on these results and admit the patient        FINAL CLINICAL IMPRESSION(S) / ED DIAGNOSES   Final diagnoses:  Altered mental status, unspecified altered mental status type  Sepsis, due to unspecified organism, unspecified whether acute organ dysfunction present [facility])  Gastrointestinal hemorrhage, unspecified gastrointestinal hemorrhage type     Rx / DC Orders   ED Discharge Orders     None        Note:  This document was prepared using Dragon voice recognition software and may include unintentional dictation errors.   Bryson Carbine, MD 09/02/23 1539

## 2023-09-02 NOTE — Assessment & Plan Note (Signed)
 Hold home antihypertensives

## 2023-09-02 NOTE — Assessment & Plan Note (Signed)
 Per chart review, patient has a history of chronic anemia in the setting of malignancy ranging around 8 generally, now 5.9 after starting Eliquis  3 weeks ago for acute DVT.  She endorses dark, stool concerning for melena.   -1 unit of packed RBC ordered - Recheck CBC posttransfusion - Continue to transfuse for hemoglobin less than 7

## 2023-09-02 NOTE — Sepsis Progress Note (Signed)
 Sepsis protocol monitored by eLink ?

## 2023-09-02 NOTE — ED Notes (Signed)
 Pt ambulatory to the bathroom at this time with difficulty. Pt weak upon standing for short period of time. Able to make it to the toilet in rm and back.

## 2023-09-02 NOTE — Assessment & Plan Note (Signed)
 Discussed goals of care with Robin Daniels and her daughter at bedside.  She has been considering hospice care after her discussions with oncology, and would like to pursue that path at this time.  She would like to avoid any heroic or invasive measurements to prolong life.  During this hospitalization, she would like to specifically avoid endoscopy, IVC filter placement and possibly any further imaging.  - DNR/DNI

## 2023-09-02 NOTE — ED Provider Notes (Addendum)
 4:55 PM Assumed care for off going team.   Blood pressure (!) 150/87, pulse (!) 121, temperature 97.8 F (36.6 C), temperature source Oral, resp. rate 16, SpO2 100%.  See their HPI for full report but in brief   Patient's lactate is rising but patient is currently getting 1 unit of blood therefore I would like to have patient's blood levels, before just giving additional fluid as I do not want to fluid overload her want to make sure that she receives the Dilaudid .  Will recheck lactate after the blood is completed and see if patient needs additional fluid.  She denies any abdominal pain her abdomen is soft and nontender denies any history of kidney stones.  She did have a CT scan done on 06/10/2023 that did not see any kidney stones in her kidneys.  Her urine does have some squamous cells in it and the hospitalist does want to have it rechecked.  I did discuss hospital team for admission for anemia.  I started patient on Protonix .  Full 30 cc/kg fluid resuscitation has not been ordered given patient only is tachycardic does not meet any other sepsis criteria I suspect that her tachycardia is more likely from anemia in the setting of recently being started on blood thinner and seems less likely to be an infectious cause at this time.  I would prefer to resuscitate patient with blood over fluid at this time.   Lubertha Rush, MD 09/02/23 Darrick Embs    Lubertha Rush, MD 09/02/23 [phone]

## 2023-09-02 NOTE — Assessment & Plan Note (Signed)
 New melanotic stools over the last couple weeks after starting Eliquis , concerning for upper GI bleed.  Given patient's advancing cancer and poor functional status, patient is not interested in endoscopy at this time.  - Start Protonix  40 mg IV twice daily - Hold Eliquis 

## 2023-09-02 NOTE — Assessment & Plan Note (Signed)
 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

## 2023-09-02 NOTE — Consult Note (Signed)
 CODE SEPSIS - PHARMACY COMMUNICATION  **Broad-spectrum antimicrobials should be administered within one hour of sepsis diagnosis**  Time Code Sepsis call or page was received: 1443  Antibiotics ordered: Ceftriaxone  Time of first antibiotic administration: 1526  Additional action taken by pharmacy: N/A  If necessary, name of provider/nurse contacted: N/A    Will M. Alva Jewels, PharmD Clinical Pharmacist 09/02/2023 2:48 PM

## 2023-09-02 NOTE — ED Notes (Signed)
Lab called for 2nd set of cultures

## 2023-09-03 ENCOUNTER — Encounter: Payer: Self-pay | Admitting: Internal Medicine

## 2023-09-03 DIAGNOSIS — C250 Malignant neoplasm of head of pancreas: Secondary | ICD-10-CM | POA: Diagnosis not present

## 2023-09-03 DIAGNOSIS — A419 Sepsis, unspecified organism: Secondary | ICD-10-CM | POA: Diagnosis not present

## 2023-09-03 LAB — CBC WITH DIFFERENTIAL/PLATELET
Abs Immature Granulocytes: 0.04 10*3/uL (ref 0.00–0.07)
Basophils Absolute: 0 10*3/uL (ref 0.0–0.1)
Basophils Relative: 0 %
Eosinophils Absolute: 0 10*3/uL (ref 0.0–0.5)
Eosinophils Relative: 0 %
HCT: 19.2 % — ABNORMAL LOW (ref 36.0–46.0)
Hemoglobin: 6.7 g/dL — ABNORMAL LOW (ref 12.0–15.0)
Immature Granulocytes: 1 %
Lymphocytes Relative: 20 %
Lymphs Abs: 1.7 10*3/uL (ref 0.7–4.0)
MCH: 27.7 pg (ref 26.0–34.0)
MCHC: 34.9 g/dL (ref 30.0–36.0)
MCV: 79.3 fL — ABNORMAL LOW (ref 80.0–100.0)
Monocytes Absolute: 0.5 10*3/uL (ref 0.1–1.0)
Monocytes Relative: 6 %
Neutro Abs: 6.2 10*3/uL (ref 1.7–7.7)
Neutrophils Relative %: 73 %
Platelets: 194 10*3/uL (ref 150–400)
RBC: 2.42 MIL/uL — ABNORMAL LOW (ref 3.87–5.11)
RDW: 18.9 % — ABNORMAL HIGH (ref 11.5–15.5)
WBC: 8.5 10*3/uL (ref 4.0–10.5)
nRBC: 0.2 % (ref 0.0–0.2)

## 2023-09-03 LAB — COMPREHENSIVE METABOLIC PANEL WITH GFR
ALT: 45 U/L — ABNORMAL HIGH (ref 0–44)
AST: 70 U/L — ABNORMAL HIGH (ref 15–41)
Albumin: 2.1 g/dL — ABNORMAL LOW (ref 3.5–5.0)
Alkaline Phosphatase: 102 U/L (ref 38–126)
Anion gap: 10 (ref 5–15)
BUN: 14 mg/dL (ref 8–23)
CO2: 25 mmol/L (ref 22–32)
Calcium: 8.1 mg/dL — ABNORMAL LOW (ref 8.9–10.3)
Chloride: 104 mmol/L (ref 98–111)
Creatinine, Ser: 0.83 mg/dL (ref 0.44–1.00)
GFR, Estimated: >60 mL/min (ref >60)
Glucose, Bld: 101 mg/dL — ABNORMAL HIGH (ref 70–99)
Potassium: 3.2 mmol/L — ABNORMAL LOW (ref 3.5–5.1)
Sodium: 139 mmol/L (ref 135–145)
Total Bilirubin: 1 mg/dL (ref 0.0–1.2)
Total Protein: 5.5 g/dL — ABNORMAL LOW (ref 6.5–8.1)

## 2023-09-03 LAB — PHOSPHORUS: Phosphorus: 2.3 mg/dL — ABNORMAL LOW (ref 2.5–4.6)

## 2023-09-03 LAB — MAGNESIUM: Magnesium: 1.6 mg/dL — ABNORMAL LOW (ref 1.7–2.4)

## 2023-09-03 LAB — PREPARE RBC (CROSSMATCH)

## 2023-09-03 MED ORDER — SODIUM CHLORIDE 0.9% FLUSH
10.0000 mL | Freq: Two times a day (BID) | INTRAVENOUS | Status: DC
  Administered 2023-09-03 – 2023-09-05 (×5): 10 mL

## 2023-09-03 MED ORDER — SODIUM CHLORIDE 0.9% FLUSH: 10.0000 mL | INTRAVENOUS | Status: DC | PRN

## 2023-09-03 MED ORDER — POTASSIUM CHLORIDE 10 MEQ/100ML IV SOLN
10.0000 meq | INTRAVENOUS | Status: AC
  Administered 2023-09-03 (×4): 10 meq via INTRAVENOUS
  Filled 2023-09-03 (×4): qty 100

## 2023-09-03 MED ORDER — POTASSIUM CHLORIDE CRYS ER 20 MEQ PO TBCR
40.0000 meq | EXTENDED_RELEASE_TABLET | Freq: Two times a day (BID) | ORAL | Status: DC
  Filled 2023-09-03: qty 2

## 2023-09-03 MED ORDER — SODIUM CHLORIDE 0.9% IV SOLUTION: Freq: Once | INTRAVENOUS | Status: AC

## 2023-09-03 MED ORDER — MAGNESIUM SULFATE 2 GM/50ML IV SOLN
2.0000 g | Freq: Once | INTRAVENOUS | Status: AC
  Administered 2023-09-03: 2 g via INTRAVENOUS
  Filled 2023-09-03: qty 50

## 2023-09-03 MED ORDER — CHLORHEXIDINE GLUCONATE CLOTH 2 % EX PADS
6.0000 | MEDICATED_PAD | Freq: Every day | CUTANEOUS | Status: DC
  Administered 2023-09-03 – 2023-09-05 (×3): 6 via TOPICAL

## 2023-09-03 NOTE — Consult Note (Addendum)
 PHARMACY CONSULT NOTE - FOLLOW UP  Pharmacy Consult for Electrolyte Monitoring and Replacement   Recent Labs: Potassium (mmol/L)  Date Value  09/03/2023 3.2 (L)   Magnesium  (mg/dL)  Date Value  19/14/7829 1.6 (L)   Calcium  (mg/dL)  Date Value  56/21/3086 8.1 (L)   Albumin  (g/dL)  Date Value  57/84/6962 2.1 (L)   Phosphorus (mg/dL)  Date Value  95/28/4132 5.5 (H)   Sodium (mmol/L)  Date Value  09/03/2023 139     Assessment: 68 y.o. female with medical history significant of Stage IV metastatic pancreatic adenocarcinoma s/p chemotherapy and Whipple, recent DVT on Eliquis , chronic anemia, hypertension, hyperlipidemia, vitamin B12 deficiency, who presents to the ED due to altered mental status. Pt takes spironolactone  PTA.   On LR x 24 hours.   Goal of Therapy:  WNL  Plan:  Kcl 10 mEq IV x 4. Pt not tolerating PO.  Mg 2 g IV x 1 F/u with AM labs.   Robin Daniels ,PharmD Clinical Pharmacist 09/03/2023 7:17 AM

## 2023-09-03 NOTE — Progress Notes (Signed)
   09/03/23 1300  Spiritual Encounters  Type of Visit Initial  Care provided to: Pt and family  Referral source Nurse (RN/NT/LPN)  Reason for visit Urgent spiritual support  OnCall Visit No   Chaplain met with the patient, Robin Daniels and her family members. Robin Daniels presented as calm and understanding of  her situation.  Family is upset and struggling to accept her diagnosis. Family members wanted to spend time with Robin Daniels. I listened as they expressed their pain and doubt for the days ahead. Robin Daniels was to receive care so I told the family I would check back and continue to to visit through the afternoon and provide support as needed.   Kirkland Peppers  [facility]  [phone]

## 2023-09-03 NOTE — Progress Notes (Signed)
 Progress Note    Robin Daniels  ZOX:[id number] DOB: 1955/05/31  DOA: 09/02/2023 PCP: Benuel Brazier, MD      Brief Narrative:    Medical records reviewed and are as summarized below:  Robin Daniels is a 68 y.o. female with medical history significant of Stage IV metastatic pancreatic adenocarcinoma s/p chemotherapy and Whipple, recent DVT on Eliquis , chronic anemia, hypertension, hyperlipidemia, vitamin B12 deficiency, who presented to the hospital with change in mental status, poor appetite, general weakness, fatigue, black stools, vomiting, dysuria, tea-colored urine and foul-smelling urine.  Daughter reported that patient's functional status has declined in the preceding 2 to 3 weeks.  It was reported that patient had black stools and had nonbloody vomiting on the day of admission.   ED course: On arrival to the ED, patient was normotensive at 120/68 with heart rate of 130.  She was saturating at 100% on room air.  She was afebrile at 97.9.  Initial workup notable for WBC 7.6, hemoglobin of 5.9, potassium 3.0, bicarb 21, AST 106, ALT 50, total bilirubin 1.7 and GFR of 60.  Lactic acid 2.6 and then 3.5.  Urinalysis with unclean catch, however ketonuria and many bacteria.  CT of the head without acute abnormalities.  Chest x-ray without acute disease.  Patient started on IV fluids, 1 unit of packed RBC, and Rocephin .  [facility] contacted for admission.      Assessment/Plan:   Principal Problem:   Sepsis (HCC) Active Problems:   Acute on chronic anemia   GI bleed   Elevated liver enzymes   Goals of care, counseling/discussion   Cancer of head of pancreas (HCC)   Acute DVT (deep venous thrombosis) (HCC)   Hypertension goal BP (blood pressure) < 140/90    Body mass index is 21.13 kg/m.    Suspected severe sepsis from acute UTI: Continue IV ceftriaxone  for now.  Follow-up urine and blood cultures.   Acute on chronic anemia, probably blood loss anemia: Hemoglobin up  from 5.9-6.7.  Transfuse another unit of PRBCs.  S/p transfusion with 1 unit of PRBCs on 09/02/2023.   Acute GI bleeding, melena: Reportedly, patient has been having melanotic stools for about 2 weeks since starting Eliquis .  Patient not interested in endoscopic workup   Stage IV pancreatic cancer: She saw Dr. Valentine Gasmen, oncologist on 08/08/2023 and he recommended hospice care.  Initially, patient had declined hospice care.  However, she requested that another hospice referral.  Josh, NP, with palliative care, put in a referral for [facility] hosp[facility] on 08/25/2023.   Acute DVT of left lower extremity (diagnosed on 08/09/2023): Eliquis  has been held because of concern for acute GI bleeding   Hypokalemia and hypomagnesemia: Replete potassium and magnesium  intravenously.   General Weakness, poor oral intake, frailty: This is likely multifactorial from anemia, electrolyte abnormalities and pancreatic cancer.   Elevated liver enzymes: May be related to malignancy.    Diet Order             DIET - DYS 1 Room service appropriate? Yes; Fluid consistency: Thin  Diet effective now                            Consultants: None  Procedures: None    Medications:    pantoprazole  (PROTONIX ) IV  40 mg Intravenous Q12H   potassium chloride   40 mEq Oral BID   sodium chloride  flush  3 mL Intravenous Q12H   Continuous  Infusions:  cefTRIAXone  (ROCEPHIN )  IV     lactated ringers  150 mL/hr at 09/02/23 1507   magnesium  sulfate bolus IVPB 2 g (09/03/23 0909)     Anti-infectives (From admission, onward)    Start     Dose/Rate Route Frequency Ordered Stop   09/03/23 1400  cefTRIAXone  (ROCEPHIN ) 2 g in sodium chloride  0.9 % 100 mL IVPB        2 g 200 mL/hr over 30 Minutes Intravenous Every 24 hours 09/02/23 1854     09/02/23 1445  cefTRIAXone  (ROCEPHIN ) 2 g in sodium chloride  0.9 % 100 mL IVPB        2 g 200 mL/hr over 30 Minutes Intravenous Once 09/02/23 1443 09/02/23 1636               Family Communication/Anticipated D/C date and plan/Code Status   DVT prophylaxis: SCDs Start: 09/02/23 1853     Code Status: Limited: Do not attempt resuscitation (DNR) -DNR-LIMITED -Do Not Intubate/DNI   Family Communication: Plan discussed with [place], daughter, over the phone Disposition Plan: To be determined   Status is: Inpatient Remains inpatient appropriate because: Acute on chronic anemia, general weakness       Subjective:    Interval events noted.  Patient said she is better and has no complaints.  However, daughter is concerned that patient has become very weak.  She said she had black stools and had nonbloody vomiting yesterday. Robin Daniels, daughter, at bedside. Magdaleno Schooling, RN at bedside  Objective:    Vitals:   09/02/23 1950 09/02/23 2348 09/03/23 0350 09/03/23 0754  BP: (!) 116/57 (!) 128/50 137/83 (!) 128/54  Pulse: (!) 105 100 96 96  Resp: 20 20 20 18   Temp: 98.7 F (37.1 C) 98.1 F (36.7 C) 98.2 F (36.8 C) 98.4 F (36.9 C)  TempSrc: Oral     SpO2: 100% 100% 100% 100%  Height: 5\' 8"  (1.727 m)      No data found.   Intake/Output Summary (Last 24 hours) at 09/03/2023 0939 Last data filed at 09/03/2023 0500 Gross per 24 hour  Intake 1891.33 ml  Output --  Net 1891.33 ml   There were no vitals filed for this visit.  Exam:  GEN: NAD, cachetic SKIN: Warm and dry EYES: Pale but anicteric ENT: MMM CV: RRR PULM: CTA B ABD: soft, non distended, NT, +BS CNS: AAO x 3, non focal EXT: Left leg and pedal edema, no tenderness or erythema        Data Reviewed:   I have personally reviewed following labs and imaging studies:  Labs: Labs show the following:   Basic Metabolic Panel: Recent Labs  Lab 09/02/23 1405 09/03/23 0404  NA 138 139  K 3.0* 3.2*  CL 102 104  CO2 21* 25  GLUCOSE 125* 101*  BUN 15 14  CREATININE 0.92 0.83  CALCIUM  8.2* 8.1*  MG  --  1.6*   GFR CrCl cannot be calculated (Unknown ideal  weight.). Liver Function Tests: Recent Labs  Lab 09/02/23 1405 09/03/23 0404  AST 106* 70*  ALT 50* 45*  ALKPHOS 113 102  BILITOT 1.7* 1.0  PROT 6.0* 5.5*  ALBUMIN  2.3* 2.1*   No results for input(s): "LIPASE", "AMYLASE" in the last 168 hours. No results for input(s): "AMMONIA" in the last 168 hours. Coagulation profile No results for input(s): "INR", "PROTIME" in the last 168 hours.  CBC: Recent Labs  Lab 09/02/23 1405 09/02/23 2110 09/03/23 0404  WBC 7.6 8.1 8.5  NEUTROABS  --   --  6.2  HGB 5.9* 6.5* 6.7*  HCT 17.8* 18.6* 19.2*  MCV 79.1* 79.5* 79.3*  PLT 280 180 194   Cardiac Enzymes: No results for input(s): "CKTOTAL", "CKMB", "CKMBINDEX", "TROPONINI" in the last 168 hours. BNP (last 3 results) No results for input(s): "PROBNP" in the last 8760 hours. CBG: No results for input(s): "GLUCAP" in the last 168 hours. D-Dimer: No results for input(s): "DDIMER" in the last 72 hours. Hgb A1c: No results for input(s): "HGBA1C" in the last 72 hours. Lipid Profile: No results for input(s): "CHOL", "HDL", "LDLCALC", "TRIG", "CHOLHDL", "LDLDIRECT" in the last 72 hours. Thyroid function studies: No results for input(s): "TSH", "T4TOTAL", "T3FREE", "THYROIDAB" in the last 72 hours.  Invalid input(s): "FREET3" Anemia work up: No results for input(s): "VITAMINB12", "FOLATE", "FERRITIN", "TIBC", "IRON", "RETICCTPCT" in the last 72 hours. Sepsis Labs: Recent Labs  Lab 09/02/23 1405 09/02/23 1510 09/02/23 2100 09/02/23 2110 09/03/23 0404  WBC 7.6  --   --  8.1 8.5  LATICACIDVEN 2.6* 3.5* 2.1*  --   --     Microbiology Recent Results (from the past 240 hours)  Blood culture (routine x 2)     Status: None (Preliminary result)   Collection Time: 09/02/23  3:10 PM   Specimen: BLOOD  Result Value Ref Range Status   Specimen Description BLOOD BLOOD LEFT WRIST  Final   Special Requests   Final    BOTTLES DRAWN AEROBIC AND ANAEROBIC Blood Culture results may not be  optimal due to an inadequate volume of blood received in culture bottles   Culture   Final    NO GROWTH < 24 HOURS Performed at [facility], [address], [place], [place] [address]    Report Status PENDING  Incomplete  Resp panel by RT-PCR (RSV, Flu A&B, Covid) Anterior Nasal Swab     Status: None   Collection Time: 09/02/23  3:10 PM   Specimen: Anterior Nasal Swab  Result Value Ref Range Status   SARS Coronavirus 2 by RT PCR NEGATIVE NEGATIVE Final    Comment: (NOTE) SARS-CoV-2 target nucleic acids are NOT DETECTED.  The SARS-CoV-2 RNA is generally detectable in upper respiratory specimens during the acute phase of infection. The lowest concentration of SARS-CoV-2 viral copies this assay can detect is 138 copies/mL. A negative result does not preclude SARS-Cov-2 infection and should not be used as the sole basis for treatment or other patient management decisions. A negative result may occur with  improper specimen collection/handling, submission of specimen other than nasopharyngeal swab, presence of viral mutation(s) within the areas targeted by this assay, and inadequate number of viral copies(<138 copies/mL). A negative result must be combined with clinical observations, patient history, and epidemiological information. The expected result is Negative.  Fact Sheet for Patients:  BloggerCourse.com  Fact Sheet for Healthcare Providers:  SeriousBroker.it  This test is no t yet approved or cleared by the [place]  FDA and  has been authorized for detection and/or diagnosis of SARS-CoV-2 by FDA under an Emergency Use Authorization (EUA). This EUA will remain  in effect (meaning this test can be used) for the duration of the COVID-19 declaration under Section 564(b)(1) of the Act, 21 U.S.C.section 360bbb-3(b)(1), unless the authorization is terminated  or revoked sooner.       Influenza A by PCR NEGATIVE  NEGATIVE Final   Influenza B by PCR NEGATIVE NEGATIVE Final    Comment: (NOTE) The Xpert Xpress SARS-CoV-2/FLU/RSV plus assay is intended as an aid in the diagnosis of  influenza from Nasopharyngeal swab specimens and should not be used as a sole basis for treatment. Nasal washings and aspirates are unacceptable for Xpert Xpress SARS-CoV-2/FLU/RSV testing.  Fact Sheet for Patients: BloggerCourse.com  Fact Sheet for Healthcare Providers: SeriousBroker.it  This test is not yet approved or cleared by the [place]  FDA and has been authorized for detection and/or diagnosis of SARS-CoV-2 by FDA under an Emergency Use Authorization (EUA). This EUA will remain in effect (meaning this test can be used) for the duration of the COVID-19 declaration under Section 564(b)(1) of the Act, 21 U.S.C. section 360bbb-3(b)(1), unless the authorization is terminated or revoked.     Resp Syncytial Virus by PCR NEGATIVE NEGATIVE Final    Comment: (NOTE) Fact Sheet for Patients: BloggerCourse.com  Fact Sheet for Healthcare Providers: SeriousBroker.it  This test is not yet approved or cleared by the [place]  FDA and has been authorized for detection and/or diagnosis of SARS-CoV-2 by FDA under an Emergency Use Authorization (EUA). This EUA will remain in effect (meaning this test can be used) for the duration of the COVID-19 declaration under Section 564(b)(1) of the Act, 21 U.S.C. section 360bbb-3(b)(1), unless the authorization is terminated or revoked.  Performed at [facility] [address] [place], [place] [address]   Blood culture (routine x 2)     Status: None (Preliminary result)   Collection Time: 09/02/23  4:08 PM   Specimen: BLOOD  Result Value Ref Range Status   Specimen Description BLOOD BLOOD RIGHT HAND  Final   Special Requests   Final    BOTTLES DRAWN AEROBIC  AND ANAEROBIC Blood Culture adequate volume   Culture   Final    NO GROWTH < 24 HOURS Performed at [facility], [address] [place], [place] [address]    Report Status PENDING  Incomplete    Procedures and diagnostic studies:  CT Head Wo Contrast Result Date: 09/02/2023 CLINICAL DATA:  Mental status change, unknown cause Active chemotherapy for pancreatic cancer. EXAM: CT HEAD WITHOUT CONTRAST TECHNIQUE: Contiguous axial images were obtained from the base of the skull through the vertex without intravenous contrast. RADIATION DOSE REDUCTION: This exam was performed according to the departmental dose-optimization program which includes automated exposure control, adjustment of the mA and/or kV according to patient size and/or use of iterative reconstruction technique. COMPARISON:  None Available. FINDINGS: Brain: No intracranial hemorrhage, mass effect, or midline shift. No hydrocephalus. The basilar cisterns are patent. 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 No evidence of territorial infarct or acute ischemia. No extra-axial or intracranial fluid collection. Vascular: Atherosclerosis of skullbase vasculature without hyperdense vessel or abnormal calcification. Skull: No fracture or focal lesion. Sinuses/Orbits: Paranasal sinuses and mastoid air cells are clear. The visualized orbits are unremarkable. Other: None. IMPRESSION: 1. No acute intracranial abnormality. 2. 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 Electronically Signed   By: Chadwick Colonel M.D.   On: 09/02/2023 16:08   DG Chest Port 1 View Result Date: 09/02/2023 CLINICAL DATA:  Weakness. Altered mental status. Decreased appetite. Dysuria. Foul odor in urine. Undergoing chemotherapy for pancreatic cancer. EXAM: PORTABLE CHEST 1 VIEW COMPARISON:  06/10/2023. Chest, abdomen and pelvis CT dated 01/25/2023. FINDINGS: The heart remains normal in size. Tortuous and partially calcified thoracic aorta. Clear  lungs with normal vascularity. Stable right jugular porta catheter with its tip in the inferior aspect of the superior vena cava. 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 Unremarkable bones. IMPRESSION: No  acute abnormality. Electronically Signed   By: Catherin Closs M.D.   On: 09/02/2023 15:07               LOS: 1 day   Nasario Czerniak  [facility]   Pager on [email or web address]. If 7PM-7AM, please contact night-coverage at [email or web address]     09/03/2023, 9:39 AM

## 2023-09-03 NOTE — IPAL (Signed)
  Interdisciplinary Goals of Care Family Meeting   Date carried out: 09/03/2023  Location of the meeting: Bedside  Member's involved: Physician, Bedside Registered Nurse, and Family Member or next of kin  Durable Power of Attorney or acting medical decision maker: [place], daughter    Discussion: We discussed diagnoses, prognosis and goals of care for [employer] .  She had declined hospice in the past but had recently expressed interest in enrolling in hospice.  However, when I asked her how she wanted us  to proceed, she was unable to give me a clear answer.  She is finding it very difficult to make up her mind as to which route she wants to pursue.  [place], daughter, at the bedside wanted patient to make her own decisions since she has the capacity to do so.  Patient is still unable to make up her mind.  She has been advised to take her time and think carefully about this and elevated with family members as well before making up her mind.  However, she does not want any procedures, surgery or aggressive workup.  Code status:   Code Status: Limited: Do not attempt resuscitation (DNR) -DNR-LIMITED -Do Not Intubate/DNI    Disposition: Continue current acute care  Time spent for the meeting: 10 minutes    Sheril Dines, MD  09/03/2023, 1:36 PM

## 2023-09-04 DIAGNOSIS — K922 Gastrointestinal hemorrhage, unspecified: Secondary | ICD-10-CM

## 2023-09-04 DIAGNOSIS — Z789 Other specified health status: Secondary | ICD-10-CM

## 2023-09-04 DIAGNOSIS — R4182 Altered mental status, unspecified: Secondary | ICD-10-CM | POA: Diagnosis not present

## 2023-09-04 DIAGNOSIS — A419 Sepsis, unspecified organism: Secondary | ICD-10-CM | POA: Diagnosis not present

## 2023-09-04 DIAGNOSIS — Z7189 Other specified counseling: Secondary | ICD-10-CM | POA: Diagnosis not present

## 2023-09-04 DIAGNOSIS — Z515 Encounter for palliative care: Secondary | ICD-10-CM | POA: Diagnosis not present

## 2023-09-04 DIAGNOSIS — Z66 Do not resuscitate: Secondary | ICD-10-CM | POA: Diagnosis not present

## 2023-09-04 LAB — RENAL FUNCTION PANEL
Albumin: 2 g/dL — ABNORMAL LOW (ref 3.5–5.0)
Anion gap: 8 (ref 5–15)
BUN: 14 mg/dL (ref 8–23)
CO2: 24 mmol/L (ref 22–32)
Calcium: 7.8 mg/dL — ABNORMAL LOW (ref 8.9–10.3)
Chloride: 105 mmol/L (ref 98–111)
Creatinine, Ser: 0.83 mg/dL (ref 0.44–1.00)
GFR, Estimated: >60 mL/min (ref >60)
Glucose, Bld: 96 mg/dL (ref 70–99)
Phosphorus: 2.3 mg/dL — ABNORMAL LOW (ref 2.5–4.6)
Potassium: 3.3 mmol/L — ABNORMAL LOW (ref 3.5–5.1)
Sodium: 137 mmol/L (ref 135–145)

## 2023-09-04 LAB — TYPE AND SCREEN
ABO/RH(D): B POS
Antibody Screen: NEGATIVE
Unit division: 0
Unit division: 0

## 2023-09-04 LAB — CBC
HCT: 22 % — ABNORMAL LOW (ref 36.0–46.0)
Hemoglobin: 7.8 g/dL — ABNORMAL LOW (ref 12.0–15.0)
MCH: 29.1 pg (ref 26.0–34.0)
MCHC: 35.5 g/dL (ref 30.0–36.0)
MCV: 82.1 fL (ref 80.0–100.0)
Platelets: 156 10*3/uL (ref 150–400)
RBC: 2.68 MIL/uL — ABNORMAL LOW (ref 3.87–5.11)
RDW: 18.7 % — ABNORMAL HIGH (ref 11.5–15.5)
WBC: 8.5 10*3/uL (ref 4.0–10.5)
nRBC: 0.4 % — ABNORMAL HIGH (ref 0.0–0.2)

## 2023-09-04 LAB — BPAM RBC
Blood Product Expiration Date: 202507052359
Blood Product Expiration Date: 202507062359
ISSUE DATE / TIME: 202506061645
ISSUE DATE / TIME: 202506071407
Unit Type and Rh: 5100
Unit Type and Rh: 7300

## 2023-09-04 LAB — MAGNESIUM: Magnesium: 2 mg/dL (ref 1.7–2.4)

## 2023-09-04 MED ORDER — POTASSIUM CHLORIDE 10 MEQ/100ML IV SOLN
10.0000 meq | INTRAVENOUS | Status: AC
  Administered 2023-09-04 (×4): 10 meq via INTRAVENOUS
  Filled 2023-09-04 (×4): qty 100

## 2023-09-04 NOTE — Progress Notes (Signed)
 Progress Note    JANNESSA OGDEN  ZOX:[id number] DOB: 21-Jun-1955  DOA: 09/02/2023 PCP: Benuel Brazier, MD      Brief Narrative:    Medical records reviewed and are as summarized below:  Robin Daniels is a 68 y.o. female with medical history significant of Stage IV metastatic pancreatic adenocarcinoma s/p chemotherapy and Whipple, recent DVT on Eliquis , chronic anemia, hypertension, hyperlipidemia, vitamin B12 deficiency, who presented to the hospital with change in mental status, poor appetite, general weakness, fatigue, black stools, vomiting, dysuria, tea-colored urine and foul-smelling urine.  Daughter reported that patient's functional status has declined in the preceding 2 to 3 weeks.  It was reported that patient had black stools and had nonbloody vomiting on the day of admission.   ED course: On arrival to the ED, patient was normotensive at 120/68 with heart rate of 130.  She was saturating at 100% on room air.  She was afebrile at 97.9.  Initial workup notable for WBC 7.6, hemoglobin of 5.9, potassium 3.0, bicarb 21, AST 106, ALT 50, total bilirubin 1.7 and GFR of 60.  Lactic acid 2.6 and then 3.5.  Urinalysis with unclean catch, however ketonuria and many bacteria.  CT of the head without acute abnormalities.  Chest x-ray without acute disease.  Patient started on IV fluids, 1 unit of packed RBC, and Rocephin .  TRH contacted for admission.      Assessment/Plan:   Principal Problem:   Sepsis (HCC) Active Problems:   Acute on chronic anemia   GI bleed   Elevated liver enzymes   Goals of care, counseling/discussion   Cancer of head of pancreas (HCC)   Acute DVT (deep venous thrombosis) (HCC)   Hypertension goal BP (blood pressure) < 140/90    Body mass index is 21.13 kg/m.    Suspected severe sepsis from acute UTI: Urine culture is pending.  Continue IV ceftriaxone .  No growth on blood cultures thus far.     Acute on chronic anemia, probably blood loss  anemia: Hemoglobin up from 5.9-6.7- 7.8.  S/p transfusion of 1 unit of PRBCs on 09/03/2023.  S/p transfusion with 1 unit of PRBCs on 09/02/2023.   Acute GI bleeding, melena: Reportedly, patient has been having melanotic stools for about 2 weeks since starting Eliquis .  Patient not interested in endoscopic workup   Stage IV pancreatic cancer: She saw Dr. Valentine Gasmen, oncologist on 08/08/2023 and he recommended hospice care.  Initially, patient had declined hospice care.  However, she requested another hospice referral.  Josh, NP, with palliative care, put in a referral for [facility] hospice on 08/25/2023.   Acute DVT of left lower extremity (diagnosed on 08/09/2023): Eliquis  has been held because of concern for acute GI bleeding   Hypophosphatemia, hypokalemia and hypomagnesemia: R continue potassium repletion.  Follow-up with pharmacist for electrolyte management.     General Weakness, poor oral intake, frailty: This is likely multifactorial from anemia, electrolyte abnormalities and pancreatic cancer.   Elevated liver enzymes: May be related to malignancy.  Discussed goals of care with patient and [place], daughter, at the bedside.  Patient has decided to enroll in hospice.  However, she prefers to go home with hospice.  She also wants to address electrolyte issues and treat this importance can be treated.  Consulted hospice team  Diet Order             DIET - DYS 1 Room service appropriate? Yes; Fluid consistency: Thin  Diet effective now  Consultants: None  Procedures: None    Medications:    Chlorhexidine  Gluconate Cloth  6 each Topical Daily   pantoprazole  (PROTONIX ) IV  40 mg Intravenous Q12H   sodium chloride  flush  10-40 mL Intracatheter Q12H   sodium chloride  flush  3 mL Intravenous Q12H   Continuous Infusions:  cefTRIAXone  (ROCEPHIN )  IV 2 g (09/03/23 1650)   potassium chloride  10 mEq (09/04/23 1234)     Anti-infectives  (From admission, onward)    Start     Dose/Rate Route Frequency Ordered Stop   09/03/23 1400  cefTRIAXone  (ROCEPHIN ) 2 g in sodium chloride  0.9 % 100 mL IVPB        2 g 200 mL/hr over 30 Minutes Intravenous Every 24 hours 09/02/23 1854     09/02/23 1445  cefTRIAXone  (ROCEPHIN ) 2 g in sodium chloride  0.9 % 100 mL IVPB        2 g 200 mL/hr over 30 Minutes Intravenous Once 09/02/23 1443 09/02/23 1636              Family Communication/Anticipated D/C date and plan/Code Status   DVT prophylaxis: SCDs Start: 09/02/23 1853     Code Status: Limited: Do not attempt resuscitation (DNR) -DNR-LIMITED -Do Not Intubate/DNI   Family Communication: Plan discussed with Nintisha, daughter, at the bedside Disposition Plan: Plan to discharge home with hospice   Status is: Inpatient Remains inpatient appropriate because: Acute on chronic anemia, general weakness       Subjective:   Interval events noted.  Complains of general weakness.  Does not report any pain to her daughter thinks she is in pain.  No black stools or vomiting.  Objective:    Vitals:   09/04/23 0027 09/04/23 0624 09/04/23 0819 09/04/23 1237  BP: (!) 117/99 (!) 120/47 (!) 111/48 129/60  Pulse: 88 84 84 89  Resp: 18 16 20 20   Temp: 98 F (36.7 C) 97.7 F (36.5 C) 97.8 F (36.6 C)   TempSrc: Oral Oral    SpO2: 100% 100% 100% 100%  Height:       No data found.   Intake/Output Summary (Last 24 hours) at 09/04/2023 1259 Last data filed at 09/04/2023 0000 Gross per 24 hour  Intake 627.33 ml  Output --  Net 627.33 ml   There were no vitals filed for this visit.  Exam:   GEN: NAD SKIN: Warm and dry EYES: No pallor or icterus ENT: MMM CV: RRR PULM: CTA B ABD: soft, ND, NT, +BS CNS: AAO x 3, non focal EXT: Left leg and pedal edema without erythema or tenderness       Data Reviewed:   I have personally reviewed following labs and imaging studies:  Labs: Labs show the following:   Basic  Metabolic Panel: Recent Labs  Lab 09/02/23 1405 09/03/23 0403 09/03/23 0404 09/04/23 0622  NA 138  --  139 137  K 3.0*  --  3.2* 3.3*  CL 102  --  104 105  CO2 21*  --  25 24  GLUCOSE 125*  --  101* 96  BUN 15  --  14 14  CREATININE 0.92  --  0.83 0.83  CALCIUM  8.2*  --  8.1* 7.8*  MG  --   --  1.6* 2.0  PHOS  --  2.3*  --  2.3*   GFR CrCl cannot be calculated (Unknown ideal weight.). Liver Function Tests: Recent Labs  Lab 09/02/23 1405 09/03/23 0404 09/04/23 0622  AST 106* 70*  --  ALT 50* 45*  --   ALKPHOS 113 102  --   BILITOT 1.7* 1.0  --   PROT 6.0* 5.5*  --   ALBUMIN  2.3* 2.1* 2.0*   No results for input(s): "LIPASE", "AMYLASE" in the last 168 hours. No results for input(s): "AMMONIA" in the last 168 hours. Coagulation profile No results for input(s): "INR", "PROTIME" in the last 168 hours.  CBC: Recent Labs  Lab 09/02/23 1405 09/02/23 2110 09/03/23 0404 09/04/23 0622  WBC 7.6 8.1 8.5 8.5  NEUTROABS  --   --  6.2  --   HGB 5.9* 6.5* 6.7* 7.8*  HCT 17.8* 18.6* 19.2* 22.0*  MCV 79.1* 79.5* 79.3* 82.1  PLT 280 180 194 156   Cardiac Enzymes: No results for input(s): "CKTOTAL", "CKMB", "CKMBINDEX", "TROPONINI" in the last 168 hours. BNP (last 3 results) No results for input(s): "PROBNP" in the last 8760 hours. CBG: No results for input(s): "GLUCAP" in the last 168 hours. D-Dimer: No results for input(s): "DDIMER" in the last 72 hours. Hgb A1c: No results for input(s): "HGBA1C" in the last 72 hours. Lipid Profile: No results for input(s): "CHOL", "HDL", "LDLCALC", "TRIG", "CHOLHDL", "LDLDIRECT" in the last 72 hours. Thyroid function studies: No results for input(s): "TSH", "T4TOTAL", "T3FREE", "THYROIDAB" in the last 72 hours.  Invalid input(s): "FREET3" Anemia work up: No results for input(s): "VITAMINB12", "FOLATE", "FERRITIN", "TIBC", "IRON", "RETICCTPCT" in the last 72 hours. Sepsis Labs: Recent Labs  Lab 09/02/23 1405 09/02/23 1510  09/02/23 2100 09/02/23 2110 09/03/23 0404 09/04/23 0622  WBC 7.6  --   --  8.1 8.5 8.5  LATICACIDVEN 2.6* 3.5* 2.1*  --   --   --     Microbiology Recent Results (from the past 240 hours)  Blood culture (routine x 2)     Status: None (Preliminary result)   Collection Time: 09/02/23  3:10 PM   Specimen: BLOOD  Result Value Ref Range Status   Specimen Description BLOOD BLOOD LEFT WRIST  Final   Special Requests   Final    BOTTLES DRAWN AEROBIC AND ANAEROBIC Blood Culture results may not be optimal due to an inadequate volume of blood received in culture bottles   Culture   Final    NO GROWTH 2 DAYS Performed at [facility], [address], [place], [place] [address]    Report Status PENDING  Incomplete  Resp panel by RT-PCR (RSV, Flu A&B, Covid) Anterior Nasal Swab     Status: None   Collection Time: 09/02/23  3:10 PM   Specimen: Anterior Nasal Swab  Result Value Ref Range Status   SARS Coronavirus 2 by RT PCR NEGATIVE NEGATIVE Final    Comment: (NOTE) SARS-CoV-2 target nucleic acids are NOT DETECTED.  The SARS-CoV-2 RNA is generally detectable in upper respiratory specimens during the acute phase of infection. The lowest concentration of SARS-CoV-2 viral copies this assay can detect is 138 copies/mL. A negative result does not preclude SARS-Cov-2 infection and should not be used as the sole basis for treatment or other patient management decisions. A negative result may occur with  improper specimen collection/handling, submission of specimen other than nasopharyngeal swab, presence of viral mutation(s) within the areas targeted by this assay, and inadequate number of viral copies(<138 copies/mL). A negative result must be combined with clinical observations, patient history, and epidemiological information. The expected result is Negative.  Fact Sheet for Patients:  BloggerCourse.com  Fact Sheet for Healthcare Providers:   SeriousBroker.it  This test is no t  yet approved or cleared by the [place]  FDA and  has been authorized for detection and/or diagnosis of SARS-CoV-2 by FDA under an Emergency Use Authorization (EUA). This EUA will remain  in effect (meaning this test can be used) for the duration of the COVID-19 declaration under Section 564(b)(1) of the Act, 21 U.S.C.section 360bbb-3(b)(1), unless the authorization is terminated  or revoked sooner.       Influenza A by PCR NEGATIVE NEGATIVE Final   Influenza B by PCR NEGATIVE NEGATIVE Final    Comment: (NOTE) The Xpert Xpress SARS-CoV-2/FLU/RSV plus assay is intended as an aid in the diagnosis of influenza from Nasopharyngeal swab specimens and should not be used as a sole basis for treatment. Nasal washings and aspirates are unacceptable for Xpert Xpress SARS-CoV-2/FLU/RSV testing.  Fact Sheet for Patients: BloggerCourse.com  Fact Sheet for Healthcare Providers: SeriousBroker.it  This test is not yet approved or cleared by the [place]  FDA and has been authorized for detection and/or diagnosis of SARS-CoV-2 by FDA under an Emergency Use Authorization (EUA). This EUA will remain in effect (meaning this test can be used) for the duration of the COVID-19 declaration under Section 564(b)(1) of the Act, 21 U.S.C. section 360bbb-3(b)(1), unless the authorization is terminated or revoked.     Resp Syncytial Virus by PCR NEGATIVE NEGATIVE Final    Comment: (NOTE) Fact Sheet for Patients: BloggerCourse.com  Fact Sheet for Healthcare Providers: SeriousBroker.it  This test is not yet approved or cleared by the [place]  FDA and has been authorized for detection and/or diagnosis of SARS-CoV-2 by FDA under an Emergency Use Authorization (EUA). This EUA will remain in effect (meaning this test can be used) for  the duration of the COVID-19 declaration under Section 564(b)(1) of the Act, 21 U.S.C. section 360bbb-3(b)(1), unless the authorization is terminated or revoked.  Performed at [facility], [address] [place], [place] [address]   Blood culture (routine x 2)     Status: None (Preliminary result)   Collection Time: 09/02/23  4:08 PM   Specimen: BLOOD  Result Value Ref Range Status   Specimen Description BLOOD BLOOD RIGHT HAND  Final   Special Requests   Final    BOTTLES DRAWN AEROBIC AND ANAEROBIC Blood Culture adequate volume   Culture   Final    NO GROWTH 2 DAYS Performed at [facility], [address], [place], [place] [address]    Report Status PENDING  Incomplete  Urine Culture     Status: None (Preliminary result)   Collection Time: 09/02/23  4:25 PM   Specimen: In/Out Cath Urine  Result Value Ref Range Status   Specimen Description   Final    IN/OUT CATH URINE Performed at [facility], [address], [place], [place] [address]    Special Requests   Final    NONE Performed at [facility], [address], [place], [place] [address]    Culture   Final    CULTURE REINCUBATED FOR BETTER GROWTH Performed at [facility], [address], [place], [place] [address]    Report Status PENDING  Incomplete    Procedures and diagnostic studies:  CT Head Wo Contrast Result Date: 09/02/2023 CLINICAL DATA:  Mental status change, unknown cause Active chemotherapy for pancreatic cancer. EXAM: CT HEAD WITHOUT CONTRAST TECHNIQUE: Contiguous axial images were obtained from the base of the skull through the vertex without intravenous contrast. RADIATION DOSE REDUCTION: This exam was performed according to the departmental  dose-optimization program which includes automated exposure control, adjustment of the mA and/or kV according to patient size and/or use of iterative reconstruction technique. COMPARISON:  None Available. FINDINGS: Brain:  No intracranial hemorrhage, mass effect, or midline shift. No hydrocephalus. The basilar cisterns are patent. 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 No evidence of territorial infarct or acute ischemia. No extra-axial or intracranial fluid collection. Vascular: Atherosclerosis of skullbase vasculature without hyperdense vessel or abnormal calcification. Skull: No fracture or focal lesion. Sinuses/Orbits: Paranasal sinuses and mastoid air cells are clear. The visualized orbits are unremarkable. Other: None. IMPRESSION: 1. No acute intracranial abnormality. 2. 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 Electronically Signed   By: Chadwick Colonel M.D.   On: 09/02/2023 16:08   DG Chest Port 1 View Result Date: 09/02/2023 CLINICAL DATA:  Weakness. Altered mental status. Decreased appetite. Dysuria. Foul odor in urine. Undergoing chemotherapy for pancreatic cancer. EXAM: PORTABLE CHEST 1 VIEW COMPARISON:  06/10/2023. Chest, abdomen and pelvis CT dated 01/25/2023. FINDINGS: The heart remains normal in size. Tortuous and partially calcified thoracic aorta. Clear lungs with normal vascularity. Stable right jugular porta catheter with its tip in the inferior aspect of the superior vena cava. 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 Unremarkable bones. IMPRESSION: No acute abnormality. Electronically Signed   By: Catherin Closs M.D.   On: 09/02/2023 15:07               LOS: 2 days   Rosenda Geffrard  [facility]   Pager on [email or web address]. If 7PM-7AM, please contact night-coverage at [email or web address]     09/04/2023, 12:59 PM

## 2023-09-04 NOTE — Consult Note (Signed)
 Consultation Note Date: 09/04/2023 at 1100  Patient Name: Robin Daniels  DOB: 23-Feb-1956  MRN: [id number]  Age / Sex: 68 y.o., female  PCP: Entzminger, Iantha Mainland, MD Referring Physician: Sheril Dines, MD  HPI/Patient Profile: 68 y.o. female  with past medical history significant for stage IV metastatic pancreatic adenocarcinoma s/p chemotherapy and Whipple, recent DVT on Eliquis , chronic anemia, HTN, HLD and vitamin B12 deficiency. She presented to ED 09/02/23 c/o decrease appetite, dysuria, foul odor in urine, weakness and AMS. Daughter reported in the ED that she had noticed significant decline in functional status with worsening weakness and fatigue as well as intermittent confusion. She adds that her mother had tea colored urine and black stools.   ED workup notable for K+ 3.0, Albumin  2.3, Ca++ 8.2, AST 106, ALT 50, Tbili 1.7, RBC 2.25, Hgb 5.9, Hct 17.8, lactic 2.6 ->3.5. CXR negative for acute abnormality. Urine positive for ketonuria and many bacteria.   CT Head showed no acute intracranial abnormality with punctate remote lacunar infarct in the right caudate.   Patient admitted for management of sepsis, acute on chronic anemia, GI bleed and elevated LFTs.   Palliative care consulted for GOC conversation.    Clinical Assessment and Goals of Care: Extensive chart review completed prior to meeting patient including labs, vital signs, imaging, progress notes, orders, and available advanced directive documents from current and previous encounters. I then met with patient, daughter, Robin Daniels and granddaughter to discuss diagnosis prognosis, GOC, EOL wishes, disposition and options.  I introduced Palliative Medicine as specialized medical care for people living with serious illness. It focuses on providing relief from the symptoms and stress of a serious illness. The goal is to improve quality of life for  both the patient and the family.  Elderly, ill-appearing female resting in bed with family at bedside. She is able to correctly state year, month, location and situation, but refers to her daughter as "Mom" several times during visit. Respirations even and unlabored. She is in no distress.   Pt denies pain, CP or SOB. She reports feeling "good" today. No appetite. Slept well last night. Daughter reports last BM was yesterday.   We discussed a brief life review of the patient. Mrs. Birkel has 2 children and 4 grandchildren. She is divorced. 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 She currently resides with family.   As far as functional and nutritional status, daughter reports significant decline in both. Patient is weak and has intermittent confusion. She requires help with all ADLs. Daughter reports no appetite and has not "eaten in days."   We discussed patient's current illness and what it means in the larger context of patient's on-going co-morbidities.  Natural disease trajectory and expectations at EOL were discussed.  I attempted to elicit values and goals of care important to the patient. She would like to go home with hospice care once she is stable for discharge.   The difference between aggressive medical intervention and comfort care was  considered in light of the patient's goals of care. The goal now is no aggressive treatment, but to focus on comfort in the home.   Education offered regarding concept specific to human mortality and the limitations of medical interventions to prolong life when the body begins to fail to thrive.  Family is facing treatment option decisions, advanced directive, and anticipatory care needs.    Discussed with patient/family the importance of continued conversation with family and the medical providers regarding overall plan of care and treatment options, ensuring decisions are within the context of the patient's  values and GOCs.    Hospice and Palliative Care services outpatient were explained and offered. Patient is already under OP palliative care with [facility] but wants to transition to in-home hospice care.   Questions and concerns were addressed. The family was encouraged to call with questions or concerns.   Primary Decision Maker HCPOA  Physical Exam Vitals reviewed.  Constitutional:      General: She is not in acute distress.    Appearance: She is ill-appearing.  HENT:     Head: Normocephalic and atraumatic.  Pulmonary:     Effort: Pulmonary effort is normal. No respiratory distress.  Musculoskeletal:     Right lower leg: No edema.     Left lower leg: No edema.  Skin:    General: Skin is warm and dry.  Neurological:     Mental Status: She is alert. She is disoriented.     Recommendations/Plan: DNR/DNI Continue current supportive interventions Plan to d/c home with hospice when medically stable  Palliative Assessment/Data: 20-30%   Discussed plan of care with Attending MD, Primary RN, TOC/ACC liaison.   Thank you for this consult. Palliative medicine will continue to follow and assist holistically.   Time Total: 75 minutes  Time spent includes: Detailed review of medical records (labs, imaging, vital signs), medically appropriate exam (mental status, respiratory, cardiac, skin), discussed with treatment team, counseling and educating patient, family and staff, documenting clinical information, medication management and coordination of care.     Ina Manas, Joyice Nodal- [facility] Palliative Medicine Team  09/04/2023 9:05 AM  Office [phone]  Pager [phone]     Please contact Palliative Medicine Team providers via AMION for questions and concerns.

## 2023-09-04 NOTE — Care Management Important Message (Signed)
 Important Message  Patient Details  Name: Robin Daniels MRN: [id number] Date of Birth: 22-Sep-1955   Important Message Given:  Yes - Medicare IM     Anise Kerns 09/04/2023, 3:15 PM

## 2023-09-04 NOTE — TOC Initial Note (Signed)
 Transition of Care [facility]) - Initial/Assessment Note    Patient Details  Name: Robin Daniels MRN: [id number] Date of Birth: October 31, 1955  Transition of Care [facility]) CM/SW Contact:    Alexandra Ice, RN Phone Number: 09/04/2023, 3:53 PM  Clinical Narrative:                 Patient lives with family, mother and son. Palliative care and hospice consulted. Patient agreed to hospice care. TOC will continue to follow for any additional discharge needs.    Barriers to Discharge: Barriers Resolved   Patient Goals and CMS Choice Patient states their goals for this hospitalization and ongoing recovery are:: go home   Choice offered to / list presented to : Patient      Expected Discharge Plan and Services                           DME Agency: NA       HH Arranged: NA          Prior Living Arrangements/Services                       Activities of Daily Living   ADL Screening (condition at time of admission) Independently performs ADLs?: No Does the patient have a NEW difficulty with bathing/dressing/toileting/self-feeding that is expected to last >3 days?: Yes (Initiates electronic notice to provider for possible OT consult) Does the patient have a NEW difficulty with getting in/out of bed, walking, or climbing stairs that is expected to last >3 days?: Yes (Initiates electronic notice to provider for possible PT consult) Does the patient have a NEW difficulty with communication that is expected to last >3 days?: No Is the patient deaf or have difficulty hearing?: No Does the patient have difficulty seeing, even when wearing glasses/contacts?: No Does the patient have difficulty concentrating, remembering, or making decisions?: Yes  Permission Sought/Granted                  Emotional Assessment              Admission diagnosis:  SIRS (systemic inflammatory response syndrome) (HCC) [R65.10] Altered mental status, unspecified altered mental status  type [R41.82] Gastrointestinal hemorrhage, unspecified gastrointestinal hemorrhage type [K92.2] Sepsis, due to unspecified organism, unspecified whether acute organ dysfunction present [facility]) [A41.9] Patient Active Problem List   Diagnosis Date Noted   Sepsis (HCC) 09/02/2023   Acute on chronic anemia 09/02/2023   GI bleed 09/02/2023   Vitamin B12 deficiency 09/02/2023   Cancer cachexia (HCC) 09/02/2023   Acute DVT (deep venous thrombosis) (HCC) 09/02/2023   Goals of care, counseling/discussion 06/13/2023   AKI (acute kidney injury) (HCC) 06/10/2023   Hyponatremia 06/10/2023   Drug-induced polyneuropathy (HCC) 11/04/2021   Hardening of the aorta (main artery of the heart) (HCC) 11/04/2021   Moderate protein-calorie malnutrition (HCC) 12/23/2020   Anemia due to antineoplastic chemotherapy 04/01/2020   Cancer of head of pancreas (HCC) 12/04/2019   Hypokalemia 11/14/2019   Elevated liver enzymes 11/14/2019   Bilateral primary osteoarthritis of hip 09/12/2019   Obesity 12/07/2015   Heart palpitations 02/18/2015   Hypertension goal BP (blood pressure) < 140/90 10/17/2014   Pure hypercholesterolemia 10/17/2014   H/O total knee replacement 10/17/2014   Anxiety 09/11/2014   HLD (hyperlipidemia) 09/11/2014   Migraine without aura and responsive to treatment 09/11/2014   Nocturnal cough 09/11/2014   Anemia, iron deficiency 09/11/2014   Plantar fasciitis  09/11/2014   IFG (impaired fasting glucose) 09/11/2014   Avitaminosis D 09/11/2014   Absence of interventricular septum 09/11/2014   PCP:  Orlinda Blackbird Iantha Mainland, MD Pharmacy:   [facility] [address], [place] - [address] [place] [address] Phone: [phone] Fax: [phone]  [facility] [address] [place] [address] Phone: [phone] Fax: [phone]     Social Drivers of Health (SDOH) Social History: SDOH Screenings   Food  Insecurity: Food Insecurity Present (09/03/2023)  Housing: Low Risk  (09/03/2023)  Transportation Needs: No Transportation Needs (09/03/2023)  Utilities: Not At Risk (09/03/2023)  Depression (PHQ2-9): Low Risk  (12/01/2022)  Financial Resource Strain: Low Risk  (12/01/2022)  Social Connections: Moderately Isolated (09/03/2023)  Tobacco Use: Low Risk  (09/03/2023)   SDOH Interventions: Food Insecurity Interventions: Intervention Not Indicated Housing Interventions: Intervention Not Indicated Transportation Interventions: Intervention Not Indicated Utilities Interventions: Intervention Not Indicated Social Connections Interventions: Intervention Not Indicated   Readmission Risk Interventions    06/17/2023   10:24 AM  Readmission Risk Prevention Plan  Transportation Screening Complete  PCP or Specialist Appt within 5-7 Days Complete  Home Care Screening Complete  Medication Review (RN CM) Complete

## 2023-09-04 NOTE — Consult Note (Signed)
 PHARMACY CONSULT NOTE - FOLLOW UP  Pharmacy Consult for Electrolyte Monitoring and Replacement   Recent Labs: Potassium (mmol/L)  Date Value  09/04/2023 3.3 (L)   Magnesium  (mg/dL)  Date Value  16/12/9602 2.0   Calcium  (mg/dL)  Date Value  54/11/8117 7.8 (L)   Albumin  (g/dL)  Date Value  14/78/2956 2.0 (L)   Phosphorus (mg/dL)  Date Value  21/30/8657 2.3 (L)   Sodium (mmol/L)  Date Value  09/04/2023 137     Assessment: 68 y.o. female with medical history significant of Stage IV metastatic pancreatic adenocarcinoma s/p chemotherapy and Whipple, recent DVT on Eliquis , chronic anemia, hypertension, hyperlipidemia, vitamin B12 deficiency, who presents to the ED due to altered mental status. Pt takes spironolactone  PTA.    Goal of Therapy:  WNL  Plan:  Kcl 10 mEq IV x 4. Pt not tolerating PO. Will defer Kphos replacement unless pt is having symptoms or continues to trend down.  F/u with AM labs.   Trinidad Funk ,PharmD Clinical Pharmacist 09/04/2023 7:32 AM

## 2023-09-04 NOTE — Progress Notes (Signed)
 [facility] LIAISON NOTE  Current palliative care patient who is now choosing hospice.  Patient had referral for hospice on 5.29.25.  Hospice admission nurse out to open to services and the patient changed her mind and elected palliative care. [facility] palliative saw patient and patient decided she wanted and needed hospice care.  [facility] was waiting on MD order to serve as attending and had not heard back from MD yet.  Met with patient and her daughter Larraine Plush at bedside to review hospice services.  Patient confirms  would like to have hospice services now at discharge.  Information to referral intake that patient is in the hospital.  No DME needs for discharge.  Patient lives at home with her mother and her son.  Hospital liaison team will continue to follow through final disposition.  Please call with any hospice related questions or concerns.  Ambrosio Junker, RN Nurse Liaison [phone]

## 2023-09-05 ENCOUNTER — Inpatient Hospital Stay: Admitting: Hospice and Palliative Medicine

## 2023-09-05 ENCOUNTER — Inpatient Hospital Stay

## 2023-09-05 ENCOUNTER — Inpatient Hospital Stay: Admitting: Internal Medicine

## 2023-09-05 DIAGNOSIS — Z515 Encounter for palliative care: Secondary | ICD-10-CM | POA: Diagnosis not present

## 2023-09-05 DIAGNOSIS — R4182 Altered mental status, unspecified: Secondary | ICD-10-CM | POA: Diagnosis not present

## 2023-09-05 DIAGNOSIS — D649 Anemia, unspecified: Secondary | ICD-10-CM

## 2023-09-05 DIAGNOSIS — Z789 Other specified health status: Secondary | ICD-10-CM | POA: Diagnosis not present

## 2023-09-05 DIAGNOSIS — A419 Sepsis, unspecified organism: Secondary | ICD-10-CM | POA: Diagnosis not present

## 2023-09-05 LAB — CBC
HCT: 23.1 % — ABNORMAL LOW (ref 36.0–46.0)
Hemoglobin: 8.1 g/dL — ABNORMAL LOW (ref 12.0–15.0)
MCH: 28.8 pg (ref 26.0–34.0)
MCHC: 35.1 g/dL (ref 30.0–36.0)
MCV: 82.2 fL (ref 80.0–100.0)
Platelets: 144 10*3/uL — ABNORMAL LOW (ref 150–400)
RBC: 2.81 MIL/uL — ABNORMAL LOW (ref 3.87–5.11)
RDW: 20.2 % — ABNORMAL HIGH (ref 11.5–15.5)
WBC: 9.7 10*3/uL (ref 4.0–10.5)
nRBC: 0.8 % — ABNORMAL HIGH (ref 0.0–0.2)

## 2023-09-05 LAB — RENAL FUNCTION PANEL
Albumin: 2.2 g/dL — ABNORMAL LOW (ref 3.5–5.0)
Anion gap: 7 (ref 5–15)
BUN: 14 mg/dL (ref 8–23)
CO2: 23 mmol/L (ref 22–32)
Calcium: 7.9 mg/dL — ABNORMAL LOW (ref 8.9–10.3)
Chloride: 105 mmol/L (ref 98–111)
Creatinine, Ser: 0.94 mg/dL (ref 0.44–1.00)
GFR, Estimated: >60 mL/min (ref >60)
Glucose, Bld: 92 mg/dL (ref 70–99)
Phosphorus: 2.4 mg/dL — ABNORMAL LOW (ref 2.5–4.6)
Potassium: 3.8 mmol/L (ref 3.5–5.1)
Sodium: 135 mmol/L (ref 135–145)

## 2023-09-05 LAB — URINE CULTURE

## 2023-09-05 MED ORDER — ENSURE PLUS HIGH PROTEIN PO LIQD
237.0000 mL | Freq: Two times a day (BID) | ORAL | Status: DC
  Administered 2023-09-05 (×2): 237 mL via ORAL

## 2023-09-05 MED ORDER — ACETAMINOPHEN 325 MG PO TABS: 650.0000 mg | ORAL_TABLET | Freq: Four times a day (QID) | ORAL | Status: DC | PRN

## 2023-09-05 MED ORDER — OXYCODONE HCL 5 MG PO TABS: 5.0000 mg | ORAL_TABLET | Freq: Three times a day (TID) | ORAL | 0 refills | Status: DC | PRN

## 2023-09-05 NOTE — Progress Notes (Signed)
 Palliative Care Progress Note, Assessment & Plan   Patient Name: Robin Daniels       Date: 09/05/2023 DOB: December 09, 1955  Age: 68 y.o. MRN#: [id number] Attending Physician: Sheril Dines, MD Primary Care Physician: Benuel Brazier, MD Admit Date: 09/02/2023  Subjective: Feels good today. Denies pain, nausea, CP or SOB. She did not eat breakfast d/t no appetite.   HPI: 68 y.o. female  with past medical history significant for stage IV metastatic pancreatic adenocarcinoma s/p chemotherapy and Whipple, recent DVT on Eliquis , chronic anemia, HTN, HLD and vitamin B12 deficiency. She presented to ED 09/02/23 c/o decrease appetite, dysuria, foul odor in urine, weakness and AMS. Daughter reported in the ED that she had noticed significant decline in functional status with worsening weakness and fatigue as well as intermittent confusion. She adds that her mother had tea colored urine and black stools.    ED workup notable for K+ 3.0, Albumin  2.3, Ca++ 8.2, AST 106, ALT 50, Tbili 1.7, RBC 2.25, Hgb 5.9, Hct 17.8, lactic 2.6 ->3.5. CXR negative for acute abnormality. Urine positive for ketonuria and many bacteria.    CT Head showed no acute intracranial abnormality with punctate remote lacunar infarct in the right caudate.    Patient admitted for management of sepsis, acute on chronic anemia, GI bleed and elevated LFTs.    Palliative care consulted for GOC conversation.   Summary of counseling/coordination of care: Extensive chart review completed prior to meeting patient including labs, vital signs, imaging, progress notes, orders, and available advanced directive documents from current and previous encounters.   After reviewing the patient's chart and assessing the patient at bedside, I spoke with patient in  regards to symptom management and goals of care.   Elderly, ill-appearing female resting in bed. She is alert to self, place and time but still exhibits some mild confusion. She is in no distress.   Per TOC note, patient to transfer home today with home hospice services.   Therapeutic silence and active listening provided for patient to share her thoughts and emotions regarding current medical situation.  Emotional support provided.  Physical Exam Constitutional:      General: She is not in acute distress.    Appearance: She is ill-appearing.     Comments: Frail  HENT:     Head: Normocephalic and atraumatic.     Mouth/Throat:     Mouth: Mucous membranes are dry.  Pulmonary:     Effort: Pulmonary effort is normal. No respiratory distress.  Skin:    General: Skin is warm and dry.  Neurological:     Mental Status: She is alert and oriented to person, place, and time.     Motor: Weakness present.     Recommendations/Plan: DNR/DNI Plan to d/c home today with hospice  Ensure patient is medicated for pain/nausea prior to transport  Total Time 35 minutes   Discussed plan of care with Attending MD, TOC/[facility] liaison and Primary RN   Time spent includes: Detailed review of medical records (labs, imaging, vital signs), medically appropriate exam (mental status, respiratory, cardiac, skin), discussed with treatment team, counseling and educating patient, family and staff, documenting clinical information, medication management and coordination of care.  Ina Manas, Joyice Nodal [facility] Palliative Medicine Team  09/05/2023 9:40 AM  Office [phone]  Pager [phone]

## 2023-09-05 NOTE — Plan of Care (Signed)

## 2023-09-05 NOTE — Consult Note (Signed)
 PHARMACY CONSULT NOTE - FOLLOW UP  Pharmacy Consult for Electrolyte Monitoring and Replacement   Recent Labs: Potassium (mmol/L)  Date Value  09/05/2023 3.8   Magnesium  (mg/dL)  Date Value  16/12/9602 2.0   Calcium  (mg/dL)  Date Value  54/11/8117 7.9 (L)   Albumin  (g/dL)  Date Value  14/78/2956 2.2 (L)   Phosphorus (mg/dL)  Date Value  21/30/8657 2.4 (L)   Sodium (mmol/L)  Date Value  09/05/2023 135     Assessment: 68 y.o. female with medical history significant of Stage IV metastatic pancreatic adenocarcinoma s/p chemotherapy and Whipple, recent DVT on Eliquis , chronic anemia, hypertension, hyperlipidemia, vitamin B12 deficiency, who presents to the ED due to altered mental status. Pt takes spironolactone  PTA.    Goal of Therapy:  WNL  Plan:  Will defer Kphos replacement unless pt is having symptoms, level trending up.  All other electrolytes WNL - no need for replacement at this time F/u with AM labs.   Karryn Kosinski Rodriguez-Guzman PharmD, BCPS 09/05/2023 7:39 AM

## 2023-09-05 NOTE — TOC Transition Note (Signed)
 Transition of Care [facility]) - Discharge Note   Patient Details  Name: Robin Daniels MRN: [id number] Date of Birth: [birth year]  Transition of Care [facility]) CM/SW Contact:  Odilia Bennett, LCSW Phone Number: 09/05/2023, 3:38 PM   Clinical Narrative: Patient has orders to discharge home today. [facility] liaison is aware. [facility] has been arranged and they said they were a little backed up so they may not arrive until around 6:00. Daughter is aware. No further concerns. CSW signing off.    Final next level of care: Home w Hospice Care Barriers to Discharge: Barriers Resolved   Patient Goals and CMS Choice Patient states their goals for this hospitalization and ongoing recovery are:: go home   Choice offered to / list presented to : Patient      Discharge Placement                Patient to be transferred to facility by: [facility] Name of family member notified: Larraine Plush Patient and family notified of of transfer: 09/05/23  Discharge Plan and Services Additional resources added to the After Visit Summary for                    DME Agency: NA       HH Arranged: NA          Social Drivers of Health (SDOH) Interventions SDOH Screenings   Food Insecurity: Food Insecurity Present (09/03/2023)  Housing: Low Risk  (09/03/2023)  Transportation Needs: No Transportation Needs (09/03/2023)  Utilities: Not At Risk (09/03/2023)  Depression (PHQ2-9): Low Risk  (12/01/2022)  Financial Resource Strain: Low Risk  (12/01/2022)  Social Connections: Moderately Isolated (09/03/2023)  Tobacco Use: Low Risk  (09/03/2023)     Readmission Risk Interventions    06/17/2023   10:24 AM  Readmission Risk Prevention Plan  Transportation Screening Complete  PCP or Specialist Appt within 5-7 Days Complete  Home Care Screening Complete  Medication Review (RN CM) Complete

## 2023-09-05 NOTE — Discharge Summary (Addendum)
 Physician Discharge Summary   Patient: Robin Daniels MRN: [id number] DOB: 06/22/1955  Admit date:     09/02/2023  Discharge date: 09/05/23  Discharge Physician: Sheril Dines   PCP: Benuel Brazier, MD   Recommendations at discharge:   Follow-up with the hospice team at home.  Discharge Diagnoses: Principal Problem:   Sepsis (HCC) Active Problems:   Acute on chronic anemia   GI bleed   Elevated liver enzymes   Goals of care, counseling/discussion   Cancer of head of pancreas (HCC)   Acute DVT (deep venous thrombosis) (HCC)   Hypertension goal BP (blood pressure) < 140/90  Resolved Problems:   * No resolved hospital problems. *  Hospital Course:  Robin Daniels is a 68 y.o. female with medical history significant of Stage IV metastatic pancreatic adenocarcinoma s/p chemotherapy and Whipple, recent DVT on Eliquis , chronic anemia, hypertension, hyperlipidemia, vitamin B12 deficiency, who presented to the hospital with change in mental status, poor appetite, general weakness, fatigue, black stools, vomiting, dysuria, tea-colored urine and foul-smelling urine.  Daughter reported that patient's functional status has declined in the preceding 2 to 3 weeks.  It was reported that patient had black stools and had nonbloody vomiting on the day of admission.     ED course: On arrival to the ED, patient was normotensive at 120/68 with heart rate of 130.  She was saturating at 100% on room air.  She was afebrile at 97.9.  Initial workup notable for WBC 7.6, hemoglobin of 5.9, potassium 3.0, bicarb 21, AST 106, ALT 50, total bilirubin 1.7 and GFR of 60.  Lactic acid 2.6 and then 3.5.  Urinalysis with unclean catch, however ketonuria and many bacteria.  CT of the head without acute abnormalities.  Chest x-ray without acute disease.  Patient started on IV fluids, 1 unit of packed RBC, and Rocephin .  TRH contacted for admission.          Assessment and Plan:   Suspected severe sepsis from  acute UTI: Urine culture showed multiple species.  Unable to rule in or rule out sepsis and UTI at this time.  No growth on blood cultures. She received 4 days of IV Ceftriaxone      Acute on chronic anemia, probably blood loss anemia: Hemoglobin up from 5.9-6.7- 7.8-8.1.  S/p transfusion of 1 unit of PRBCs on 09/03/2023.  S/p transfusion with 1 unit of PRBCs on 09/02/2023.     Acute GI bleeding, melena: Reportedly, patient has been having melanotic stools for about 2 weeks since starting Eliquis .  Patient not interested in endoscopic workup Last episode of melena was on 09/02/2023.     Stage IV pancreatic cancer: She saw Dr. Valentine Gasmen, oncologist on 08/08/2023 and he recommended hospice care.  Initially, patient had declined hospice care.  However, she requested another hospice referral.  Josh, NP, with palliative care, put in a referral for [facility] hosp[facility] on 08/25/2023.     Acute DVT of left lower extremity (diagnosed on 08/09/2023): Discussed risks and benefits of continuing or stopping Eliquis .  She understands that she is at increased risk of recurrent GI bleeding or other bleeding complications from Eliquis .  She also understands that she is at increased risk for venous thromboembolism because of recent DVT and underlying malignancy.  Patient and daughter had a hard time making a decision about moving forward with Eliquis .  Ultimately, patient decided against continuing with Eliquis .     Hypophosphatemia, hypokalemia and hypomagnesemia: Improved      General Weakness,  poor oral intake, frailty: This is likely multifactorial from anemia, electrolyte abnormalities and pancreatic cancer.     Elevated liver enzymes: May be related to malignancy.    Patient was given time to think about her decision regarding her care plan going forward. After careful deliberations with family, patient has decided to go home with hospice.  She is deemed medically stable for discharge.  Discharge plan was  discussed with patient and Alto Atta (daughter) at the bedside.  Discharge plan was also discussed with patient's mother and patient's sister Sherrlyn Dolores) on speaker phone in the patient's room.      Consultants: Palliative care Procedures performed: None Disposition: Home with hospice Diet recommendation:  Discharge Diet Orders (From admission, onward)     Start     Ordered   09/05/23 0000  Diet general        09/05/23 1452           Regular diet DISCHARGE MEDICATION: Allergies as of 09/05/2023   No Known Allergies      Medication List     STOP taking these medications    Apixaban  Starter Pack (10mg  and 5mg ) Commonly known as: ELIQUIS  STARTER PACK   cyanocobalamin  1000 MCG tablet Commonly known as: VITAMIN B12   diphenoxylate -atropine  2.5-0.025 MG tablet Commonly known as: LOMOTIL    feeding supplement Liqd   Ferrous Bisglycinate  Chelate 28 MG Caps   gabapentin  600 MG tablet Commonly known as: NEURONTIN    lidocaine -prilocaine  cream Commonly known as: EMLA    magic mouthwash (multi-ingredient) oral suspension   metoprolol  succinate 50 MG 24 hr tablet Commonly known as: TOPROL -XL   OLANZapine  5 MG tablet Commonly known as: ZYPREXA    ondansetron  8 MG tablet Commonly known as: ZOFRAN    potassium chloride  20 MEQ packet Commonly known as: KLOR-CON    prochlorperazine  10 MG tablet Commonly known as: COMPAZINE    spironolactone  25 MG tablet Commonly known as: ALDACTONE    traMADol  50 MG tablet Commonly known as: ULTRAM        TAKE these medications    acetaminophen  325 MG tablet Commonly known as: TYLENOL  Take 650 mg by mouth every 6 (six) hours as needed for moderate pain. What changed: Another medication with the same name was added. Make sure you understand how and when to take each.   acetaminophen  325 MG tablet Commonly known as: TYLENOL  Take 2 tablets (650 mg total) by mouth every 6 (six) hours as needed for mild pain (pain score 1-3) (or Fever  >/= 101). What changed: You were already taking a medication with the same name, and this prescription was added. Make sure you understand how and when to take each.   oxyCODONE  5 MG immediate release tablet Commonly known as: Oxy IR/ROXICODONE  Take 1 tablet (5 mg total) by mouth every 8 (eight) hours as needed for moderate pain (pain score 4-6).        Discharge Exam: Filed Weights   09/05/23 1200  Weight: 63.7 kg   GEN: NAD SKIN: Warm and dry EYES: No pallor or icterus ENT: MMM CV: RRR PULM: CTA B ABD: soft, ND, NT, +BS CNS: AAO x 3, non focal EXT: Left leg edema without erythema or tenderness   Condition at discharge: stable  The results of significant diagnostics from this hospitalization (including imaging, microbiology, ancillary and laboratory) are listed below for reference.   Imaging Studies: CT Head Wo Contrast Result Date: 09/02/2023 CLINICAL DATA:  Mental status change, unknown cause Active chemotherapy for pancreatic cancer. EXAM: CT HEAD WITHOUT CONTRAST TECHNIQUE: Contiguous  axial images were obtained from the base of the skull through the vertex without intravenous contrast. RADIATION DOSE REDUCTION: This exam was performed according to the departmental dose-optimization program which includes automated exposure control, adjustment of the mA and/or kV according to patient size and/or use of iterative reconstruction technique. COMPARISON:  None Available. FINDINGS: Brain: No intracranial hemorrhage, mass effect, or midline shift. No hydrocephalus. The basilar cisterns are patent. 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 No evidence of territorial infarct or acute ischemia. No extra-axial or intracranial fluid collection. Vascular: Atherosclerosis of skullbase vasculature without hyperdense vessel or abnormal calcification. Skull: No fracture or focal lesion. Sinuses/Orbits: Paranasal sinuses and mastoid air cells are clear. The visualized orbits are  unremarkable. Other: None. IMPRESSION: 1. No acute intracranial abnormality. 2. 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 Electronically Signed   By: Chadwick Colonel M.D.   On: 09/02/2023 16:08   DG Chest Port 1 View Result Date: 09/02/2023 CLINICAL DATA:  Weakness. Altered mental status. Decreased appetite. Dysuria. Foul odor in urine. Undergoing chemotherapy for pancreatic cancer. EXAM: PORTABLE CHEST 1 VIEW COMPARISON:  06/10/2023. Chest, abdomen and pelvis CT dated 01/25/2023. FINDINGS: The heart remains normal in size. Tortuous and partially calcified thoracic aorta. Clear lungs with normal vascularity. Stable right jugular porta catheter with its tip in the inferior aspect of the superior vena cava. 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 Unremarkable bones. IMPRESSION: No acute abnormality. Electronically Signed   By: Catherin Closs M.D.   On: 09/02/2023 15:07   US  Venous Img Lower Unilateral Left (DVT) Result Date: 08/09/2023 CLINICAL DATA:  Left lower extremity swelling and pain EXAM: LEFT LOWER EXTREMITY VENOUS DOPPLER ULTRASOUND TECHNIQUE: Gray-scale sonography with compression, as well as color and duplex ultrasound, were performed to evaluate the deep venous system(s) from the level of the common femoral vein through the popliteal and proximal calf veins. COMPARISON:  None Available. FINDINGS: VENOUS Popliteal vein is noncompressible containing occlusive thrombus extending proximally to the level of the proximal femoral vein, where the thrombus is partially occlusive. Nonocclusive thrombus also extends into the common femoral vein. Occlusive thrombus is also seen within the proximal profundus vein. Calf veins are somewhat suboptimally evaluated, likely containing nonocclusive thrombus. Limited views of the contralateral common femoral vein are unremarkable. OTHER None. Limitations: none IMPRESSION: Deep venous thrombosis involving the left lower  extremity extending from the calf to the level of the common femoral vein. These results will be called to the ordering clinician or representative by the Radiologist Assistant, and communication documented in the PACS or [employer]. Electronically Signed   By: Limin  Xu M.D.   On: 08/09/2023 17:37    Microbiology: Results for orders placed or performed during the hospital encounter of 09/02/23  Blood culture (routine x 2)     Status: None (Preliminary result)   Collection Time: 09/02/23  3:10 PM   Specimen: BLOOD  Result Value Ref Range Status   Specimen Description BLOOD BLOOD LEFT WRIST  Final   Special Requests   Final    BOTTLES DRAWN AEROBIC AND ANAEROBIC Blood Culture results may not be optimal due to an inadequate volume of blood received in culture bottles   Culture   Final    NO GROWTH 3 DAYS Performed at [facility], [address] [place], [place] [address]    Report Status PENDING  Incomplete  Resp panel by RT-PCR (RSV, Flu A&B, Covid) Anterior Nasal Swab  Status: None   Collection Time: 09/02/23  3:10 PM   Specimen: Anterior Nasal Swab  Result Value Ref Range Status   SARS Coronavirus 2 by RT PCR NEGATIVE NEGATIVE Final    Comment: (NOTE) SARS-CoV-2 target nucleic acids are NOT DETECTED.  The SARS-CoV-2 RNA is generally detectable in upper respiratory specimens during the acute phase of infection. The lowest concentration of SARS-CoV-2 viral copies this assay can detect is 138 copies/mL. A negative result does not preclude SARS-Cov-2 infection and should not be used as the sole basis for treatment or other patient management decisions. A negative result may occur with  improper specimen collection/handling, submission of specimen other than nasopharyngeal swab, presence of viral mutation(s) within the areas targeted by this assay, and inadequate number of viral copies(<138 copies/mL). A negative result must be combined with clinical observations,  patient history, and epidemiological information. The expected result is Negative.  Fact Sheet for Patients:  BloggerCourse.com  Fact Sheet for Healthcare Providers:  SeriousBroker.it  This test is no t yet approved or cleared by the [place]  FDA and  has been authorized for detection and/or diagnosis of SARS-CoV-2 by FDA under an Emergency Use Authorization (EUA). This EUA will remain  in effect (meaning this test can be used) for the duration of the COVID-19 declaration under Section 564(b)(1) of the Act, 21 U.S.C.section 360bbb-3(b)(1), unless the authorization is terminated  or revoked sooner.       Influenza A by PCR NEGATIVE NEGATIVE Final   Influenza B by PCR NEGATIVE NEGATIVE Final    Comment: (NOTE) The Xpert Xpress SARS-CoV-2/FLU/RSV plus assay is intended as an aid in the diagnosis of influenza from Nasopharyngeal swab specimens and should not be used as a sole basis for treatment. Nasal washings and aspirates are unacceptable for Xpert Xpress SARS-CoV-2/FLU/RSV testing.  Fact Sheet for Patients: BloggerCourse.com  Fact Sheet for Healthcare Providers: SeriousBroker.it  This test is not yet approved or cleared by the [place]  FDA and has been authorized for detection and/or diagnosis of SARS-CoV-2 by FDA under an Emergency Use Authorization (EUA). This EUA will remain in effect (meaning this test can be used) for the duration of the COVID-19 declaration under Section 564(b)(1) of the Act, 21 U.S.C. section 360bbb-3(b)(1), unless the authorization is terminated or revoked.     Resp Syncytial Virus by PCR NEGATIVE NEGATIVE Final    Comment: (NOTE) Fact Sheet for Patients: BloggerCourse.com  Fact Sheet for Healthcare Providers: SeriousBroker.it  This test is not yet approved or cleared by the [place] FDA and has been authorized for detection and/or diagnosis of SARS-CoV-2 by FDA under an Emergency Use Authorization (EUA). This EUA will remain in effect (meaning this test can be used) for the duration of the COVID-19 declaration under Section 564(b)(1) of the Act, 21 U.S.C. section 360bbb-3(b)(1), unless the authorization is terminated or revoked.  Performed at [facility], [address] [place], [place] [address]   Blood culture (routine x 2)     Status: None (Preliminary result)   Collection Time: 09/02/23  4:08 PM   Specimen: BLOOD  Result Value Ref Range Status   Specimen Description BLOOD BLOOD RIGHT HAND  Final   Special Requests   Final    BOTTLES DRAWN AEROBIC AND ANAEROBIC Blood Culture adequate volume   Culture   Final    NO GROWTH 3 DAYS Performed at [facility], [address], [place], [place] [address]    Report Status PENDING  Incomplete  Urine  Culture     Status: Abnormal   Collection Time: 09/02/23  4:25 PM   Specimen: In/Out Cath Urine  Result Value Ref Range Status   Specimen Description   Final    IN/OUT CATH URINE Performed at [facility], [address] [place], [place] [address]    Special Requests   Final    NONE Performed at [facility], [address] [place], [place] [address]    Culture MULTIPLE SPECIES PRESENT, SUGGEST RECOLLECTION (A)  Final   Report Status 09/05/2023 FINAL  Final    Labs: CBC: Recent Labs  Lab 09/02/23 1405 09/02/23 2110 09/03/23 0404 09/04/23 0622 09/05/23 0630  WBC 7.6 8.1 8.5 8.5 9.7  NEUTROABS  --   --  6.2  --   --   HGB 5.9* 6.5* 6.7* 7.8* 8.1*  HCT 17.8* 18.6* 19.2* 22.0* 23.1*  MCV 79.1* 79.5* 79.3* 82.1 82.2  PLT 280 180 194 156 144*   Basic Metabolic Panel: Recent Labs  Lab 09/02/23 1405 09/03/23 0403 09/03/23 0404 09/04/23 0622 09/05/23 0630  NA 138  --  139 137 135  K 3.0*  --  3.2* 3.3* 3.8  CL 102  --  104 105 105  CO2 21*  --  25 24  23   GLUCOSE 125*  --  101* 96 92  BUN 15  --  14 14 14   CREATININE 0.92  --  0.83 0.83 0.94  CALCIUM  8.2*  --  8.1* 7.8* 7.9*  MG  --   --  1.6* 2.0  --   PHOS  --  2.3*  --  2.3* 2.4*   Liver Function Tests: Recent Labs  Lab 09/02/23 1405 09/03/23 0404 09/04/23 0622 09/05/23 0630  AST 106* 70*  --   --   ALT 50* 45*  --   --   ALKPHOS 113 102  --   --   BILITOT 1.7* 1.0  --   --   PROT 6.0* 5.5*  --   --   ALBUMIN  2.3* 2.1* 2.0* 2.2*   CBG: No results for input(s): "GLUCAP" in the last 168 hours.  Discharge time spent: greater than 30 minutes.  Signed: Sheril Dines, MD [facility] 09/05/2023

## 2023-09-05 NOTE — Discharge Instructions (Signed)
 Food Resources  Agency Name: [facility] Address: [address], [place], [place] [address] Phone: [phone] Website: [email or web address] Service(s) Offered: Housing services, self-sufficiency, congregate meal program, weatherization program, Event organiser program, emergency food assistance,  housing counseling, home ownership program, wheels - to work program.  [employer] free for 60 and older at various locations from [employer], Monday-Friday:  [employer], [address] [place], [phone] -[facility], [address] [place] [phone]  -[facility], [address] [place] [phone]  -[address], [place], [phone]  Agency Name: [facility] Address: [phone] [address], [place], [place] [address] Phone: [phone] Website: [email or web address] Service(s) Offered: Home delivered hot, frozen, and emergency  meals. Grocery assistance program which matches  volunteers one-on-one with seniors unable to grocery shop  for themselves. Must be 60 years and older; less than 20  hours of in-home aide service, limited or no driving ability;  live alone or with someone with a disability; live in  [place].  Agency Name: [employer] [facility] [employer]) Address: [address], [place], [place] [address] Phone: [phone] Service(s) Offered: Food is served to shut-ins, homeless, elderly, and low income people in the community every Saturday (11:30 am-12:30 pm) and Sunday (12:30 pm-1:30pm). Volunteers also offer help and encouragement in seeking employment,  and spiritual guidance.  Agency Name: [employer] Address: [address] [place], [place] [address] Phone: [phone] Service(s) Offered: Child support services; child welfare services; food stamps; Medicaid; work first family assistance; and aid  with fuel,  rent, food and medicine.  Agency Name: [employer] Address: [address] [place], [place] Phone: [phone] Website: [email or web address].dreamalign.com Services Offered: Monday 10:00am-12:00, 8:00pm-9:00pm, and Friday 10:00am-12:00.  Agency Name: [employer] Address: [address] [place], [place] [address] Phone: [phone] Website: [email or web address] Service(s) Offered: Serves weekday meals, open from 11:30 am- 1:00 pm., and 6:30-7:30pm, Monday-Wednesday-Friday distributes food 3:30-6pm, Monday-Wednesday-Friday.  Agency Name: [facility] Address: [address] [place], [place] Phone: [phone] Website: [email or web address] Services Offered: Distributes food the 4th Saturday of the month, starting at 8:00 am  Agency Name: [facility] Address: [phone] [address], [place], [place] [address] Phone: [phone] Website: [email or web address].[email or web address] Service(s) Offered: [employer] life, weekly food pantry. Open Wednesdays from 10:00am-noon.  Agency Name: [facility] [employer] Address: [address] [place], [place], [place] Phone: [phone] Services Offered: Distributes food 9am-1pm, Monday-Thursday. Call for details.  Agency Name: [facility] Address: [address], [place], [place] [address] Phone: [phone] Website: [email or web address] Service(s) Offered: Games developer. Call for assistance.  Agency Name: [facility] Address: [address] [place], [place] [address] Phone: [phone] Service Offered: Emergency Food Pantry. Call for appointment.  Agency Name: [facility] Address: [address] [place], [place] [address] Phone: [phone] Website: [email or web address] Services Offered: Games developer. Call for details  Agency Name: [facility] Address: [address] [place], [place] Phone:  [phone] Website: [email or web address] Service(s) Offered: Emergency Food Pantry. Call for details.  Agency Name: Holiday representative Address: [address], [place], [place] [address] Phone: [phone] or [phone] Website: [email or web address] Service(s) Offered: Distribute food 9am-11:30 am, Tuesday-Friday, and 1-3:30pm, Monday-Friday. Food pantry Monday-Friday 1pm-3pm, fresh items, Mon.-Wed.-Fri.  Agency Name: [facility] [employer] Address: [address] [place], [place] [address] Phone: [phone] Website: [email or web address] Services Offered: Distribute food Tues and Sats from 9:00am-noon.  Closed 1st Saturday of each month. Call for details  Agency Name: [employer] Address: [address] [facility] [address], [place] [address] Phone: [phone]  Services Offered: Delivers meals every Thursday

## 2023-09-07 LAB — CULTURE, BLOOD (ROUTINE X 2)
Culture: NO GROWTH
Culture: NO GROWTH
Special Requests: ADEQUATE

## 2023-10-28 DEATH — deceased

## 2024-04-05 LAB — MISCELLANEOUS TEST
